# Patient Record
Sex: Male | Born: 1935 | Race: White | Hispanic: No | Smoking: Former smoker
Health system: Southern US, Community
[De-identification: ages and names within clinical notes are randomized; demographics above are authoritative.]

## PROBLEM LIST (undated history)

## (undated) DIAGNOSIS — J189 Pneumonia, unspecified organism: Secondary | ICD-10-CM

## (undated) DIAGNOSIS — N189 Chronic kidney disease, unspecified: Secondary | ICD-10-CM

## (undated) DIAGNOSIS — K219 Gastro-esophageal reflux disease without esophagitis: Secondary | ICD-10-CM

## (undated) DIAGNOSIS — R972 Elevated prostate specific antigen [PSA]: Secondary | ICD-10-CM

## (undated) DIAGNOSIS — Z9621 Cochlear implant status: Secondary | ICD-10-CM

## (undated) DIAGNOSIS — S42309A Unspecified fracture of shaft of humerus, unspecified arm, initial encounter for closed fracture: Secondary | ICD-10-CM

## (undated) DIAGNOSIS — E785 Hyperlipidemia, unspecified: Secondary | ICD-10-CM

## (undated) DIAGNOSIS — D128 Benign neoplasm of rectum: Secondary | ICD-10-CM

## (undated) DIAGNOSIS — K859 Acute pancreatitis without necrosis or infection, unspecified: Secondary | ICD-10-CM

## (undated) DIAGNOSIS — G459 Transient cerebral ischemic attack, unspecified: Secondary | ICD-10-CM

## (undated) DIAGNOSIS — I10 Essential (primary) hypertension: Secondary | ICD-10-CM

## (undated) DIAGNOSIS — K5790 Diverticulosis of intestine, part unspecified, without perforation or abscess without bleeding: Secondary | ICD-10-CM

## (undated) DIAGNOSIS — E119 Type 2 diabetes mellitus without complications: Secondary | ICD-10-CM

## (undated) DIAGNOSIS — R55 Syncope and collapse: Secondary | ICD-10-CM

## (undated) DIAGNOSIS — I2510 Atherosclerotic heart disease of native coronary artery without angina pectoris: Secondary | ICD-10-CM

## (undated) DIAGNOSIS — C801 Malignant (primary) neoplasm, unspecified: Secondary | ICD-10-CM

## (undated) DIAGNOSIS — Q6211 Congenital occlusion of ureteropelvic junction: Secondary | ICD-10-CM

## (undated) HISTORY — PX: POLYPECTOMY: SHX149

## (undated) HISTORY — PX: APPENDECTOMY: SHX54

## (undated) HISTORY — DX: Benign neoplasm of rectum: D12.8

## (undated) HISTORY — PX: CHOLECYSTECTOMY: SHX55

## (undated) HISTORY — DX: Chronic kidney disease, unspecified: N18.9

## (undated) HISTORY — DX: Cochlear implant status: Z96.21

## (undated) HISTORY — DX: Gastro-esophageal reflux disease without esophagitis: K21.9

## (undated) HISTORY — DX: Essential (primary) hypertension: I10

## (undated) HISTORY — DX: Hypercalcemia: E83.52

## (undated) HISTORY — DX: Elevated prostate specific antigen (PSA): R97.20

## (undated) HISTORY — DX: Hyperlipidemia, unspecified: E78.5

## (undated) HISTORY — DX: Diverticulosis of intestine, part unspecified, without perforation or abscess without bleeding: K57.90

## (undated) HISTORY — PX: OTHER SURGICAL HISTORY: SHX169

## (undated) HISTORY — DX: Atherosclerotic heart disease of native coronary artery without angina pectoris: I25.10

## (undated) HISTORY — DX: Acute pancreatitis without necrosis or infection, unspecified: K85.90

## (undated) HISTORY — DX: Transient cerebral ischemic attack, unspecified: G45.9

## (undated) HISTORY — DX: Congenital occlusion of ureteropelvic junction: Q62.11

## (undated) HISTORY — PX: COLONOSCOPY W/ POLYPECTOMY: SHX1380

---
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
## 1976-06-18 HISTORY — PX: PARATHYROIDECTOMY: SHX19

## 1989-02-16 HISTORY — PX: COCHLEAR IMPLANT: SUR684

## 1997-11-10 ENCOUNTER — Other Ambulatory Visit: Admission: RE | Admit: 1997-11-10 | Discharge: 1997-11-10 | Payer: Self-pay | Admitting: Urology

## 1998-06-18 HISTORY — PX: CARDIAC CATHETERIZATION: SHX172

## 1998-06-18 HISTORY — PX: CORONARY ARTERY BYPASS GRAFT: SHX141

## 1998-07-15 ENCOUNTER — Inpatient Hospital Stay (HOSPITAL_COMMUNITY): Admission: EM | Admit: 1998-07-15 | Discharge: 1998-07-20 | Payer: Self-pay | Admitting: Emergency Medicine

## 1998-07-17 ENCOUNTER — Encounter: Payer: Self-pay | Admitting: Gastroenterology

## 1998-07-19 ENCOUNTER — Encounter: Payer: Self-pay | Admitting: Gastroenterology

## 1998-07-19 ENCOUNTER — Encounter: Payer: Self-pay | Admitting: Emergency Medicine

## 1998-08-05 ENCOUNTER — Ambulatory Visit (HOSPITAL_COMMUNITY): Admission: RE | Admit: 1998-08-05 | Discharge: 1998-08-05 | Payer: Self-pay | Admitting: Gastroenterology

## 1998-08-05 ENCOUNTER — Encounter: Payer: Self-pay | Admitting: Gastroenterology

## 1998-08-19 ENCOUNTER — Encounter: Payer: Self-pay | Admitting: Gastroenterology

## 1998-08-19 ENCOUNTER — Ambulatory Visit (HOSPITAL_COMMUNITY): Admission: RE | Admit: 1998-08-19 | Discharge: 1998-08-19 | Payer: Self-pay | Admitting: Gastroenterology

## 1998-08-24 ENCOUNTER — Encounter: Payer: Self-pay | Admitting: Surgery

## 1998-08-26 ENCOUNTER — Encounter: Payer: Self-pay | Admitting: Surgery

## 1998-08-26 ENCOUNTER — Inpatient Hospital Stay (HOSPITAL_COMMUNITY): Admission: RE | Admit: 1998-08-26 | Discharge: 1998-08-30 | Payer: Self-pay | Admitting: Surgery

## 1998-08-27 ENCOUNTER — Encounter: Payer: Self-pay | Admitting: Surgery

## 1998-08-28 ENCOUNTER — Encounter: Payer: Self-pay | Admitting: Surgery

## 1998-10-25 ENCOUNTER — Encounter (HOSPITAL_COMMUNITY): Admission: RE | Admit: 1998-10-25 | Discharge: 1999-01-23 | Payer: Self-pay | Admitting: Cardiovascular Disease

## 2000-02-09 ENCOUNTER — Other Ambulatory Visit: Admission: RE | Admit: 2000-02-09 | Discharge: 2000-02-09 | Payer: Self-pay | Admitting: Urology

## 2000-02-09 ENCOUNTER — Encounter (INDEPENDENT_AMBULATORY_CARE_PROVIDER_SITE_OTHER): Payer: Self-pay | Admitting: Specialist

## 2002-11-06 ENCOUNTER — Encounter: Payer: Self-pay | Admitting: Gastroenterology

## 2003-11-22 ENCOUNTER — Emergency Department (HOSPITAL_COMMUNITY): Admission: EM | Admit: 2003-11-22 | Discharge: 2003-11-22 | Payer: Self-pay | Admitting: Emergency Medicine

## 2004-08-31 ENCOUNTER — Ambulatory Visit: Payer: Self-pay | Admitting: Cardiovascular Disease

## 2004-09-14 ENCOUNTER — Ambulatory Visit: Payer: Self-pay | Admitting: Gastroenterology

## 2004-10-16 ENCOUNTER — Ambulatory Visit: Payer: Self-pay | Admitting: Internal Medicine

## 2004-10-17 ENCOUNTER — Ambulatory Visit: Payer: Self-pay | Admitting: Gastroenterology

## 2005-02-16 ENCOUNTER — Ambulatory Visit: Payer: Self-pay | Admitting: Internal Medicine

## 2005-02-16 ENCOUNTER — Ambulatory Visit: Payer: Self-pay

## 2005-09-12 ENCOUNTER — Ambulatory Visit: Payer: Self-pay | Admitting: Cardiovascular Disease

## 2006-04-19 ENCOUNTER — Ambulatory Visit: Payer: Self-pay | Admitting: Internal Medicine

## 2006-05-16 ENCOUNTER — Ambulatory Visit: Payer: Self-pay | Admitting: Internal Medicine

## 2006-05-16 LAB — CONVERTED CEMR LAB
BUN: 19 mg/dL (ref 6–23)
Basophils Absolute: 0 10*3/uL (ref 0.0–0.1)
Calcium: 9.8 mg/dL (ref 8.4–10.5)
GFR calc non Af Amer: 64 mL/min
Glomerular Filtration Rate, Af Am: 77 mL/min/{1.73_m2}
Glucose, Bld: 127 mg/dL — ABNORMAL HIGH (ref 70–99)
HCT: 43.4 % (ref 39.0–52.0)
HDL: 39.9 mg/dL (ref >39.0)
Hemoglobin: 15 g/dL (ref 13.0–17.0)
Lymphocytes Relative: 30.1 % (ref 12.0–46.0)
MCHC: 34.5 g/dL (ref 30.0–36.0)
Monocytes Relative: 9.9 % (ref 3.0–11.0)
Neutro Abs: 2.6 10*3/uL (ref 1.4–7.7)
Neutrophils Relative %: 56.4 % (ref 43.0–77.0)
Potassium: 3.8 meq/L (ref 3.5–5.1)
TSH: 1.25 microintl units/mL (ref 0.35–5.50)
Total Bilirubin: 1 mg/dL (ref 0.3–1.2)
Uric Acid, Serum: 8.8 mg/dL — ABNORMAL HIGH (ref 2.4–7.0)
VLDL: 55 mg/dL — ABNORMAL HIGH (ref 0–40)

## 2006-05-28 ENCOUNTER — Ambulatory Visit: Payer: Self-pay | Admitting: Internal Medicine

## 2006-09-16 ENCOUNTER — Ambulatory Visit: Payer: Self-pay | Admitting: Cardiovascular Disease

## 2006-10-30 ENCOUNTER — Encounter: Payer: Self-pay | Admitting: Internal Medicine

## 2006-10-30 ENCOUNTER — Ambulatory Visit: Payer: Self-pay | Admitting: Internal Medicine

## 2006-11-05 ENCOUNTER — Ambulatory Visit: Payer: Self-pay | Admitting: Internal Medicine

## 2006-11-05 LAB — CONVERTED CEMR LAB
Cholesterol: 151 mg/dL (ref 0–200)
LDL Cholesterol: 86 mg/dL (ref 0–99)
Triglycerides: 144 mg/dL (ref 0–149)

## 2006-11-08 ENCOUNTER — Ambulatory Visit: Payer: Self-pay | Admitting: Internal Medicine

## 2006-11-29 ENCOUNTER — Telehealth (INDEPENDENT_AMBULATORY_CARE_PROVIDER_SITE_OTHER): Payer: Self-pay | Admitting: *Deleted

## 2007-02-21 ENCOUNTER — Ambulatory Visit: Payer: Self-pay

## 2007-02-21 ENCOUNTER — Ambulatory Visit: Payer: Self-pay | Admitting: Cardiovascular Disease

## 2007-03-18 ENCOUNTER — Encounter: Payer: Self-pay | Admitting: Internal Medicine

## 2007-05-12 ENCOUNTER — Ambulatory Visit: Payer: Self-pay | Admitting: Internal Medicine

## 2007-05-19 ENCOUNTER — Encounter (INDEPENDENT_AMBULATORY_CARE_PROVIDER_SITE_OTHER): Payer: Self-pay | Admitting: *Deleted

## 2007-05-19 LAB — CONVERTED CEMR LAB
ALT: 21 units/L (ref 0–53)
Direct LDL: 94.9 mg/dL
HDL: 35.6 mg/dL — ABNORMAL LOW (ref >39.0)
Hgb A1c MFr Bld: 5.5 % (ref 4.6–6.0)
Total CHOL/HDL Ratio: 4.5
Triglycerides: 205 mg/dL (ref 0–149)
VLDL: 41 mg/dL — ABNORMAL HIGH (ref 0–40)

## 2007-05-22 ENCOUNTER — Ambulatory Visit: Payer: Self-pay | Admitting: Internal Medicine

## 2007-06-04 ENCOUNTER — Ambulatory Visit: Payer: Self-pay | Admitting: Internal Medicine

## 2007-06-04 DIAGNOSIS — E782 Mixed hyperlipidemia: Secondary | ICD-10-CM | POA: Insufficient documentation

## 2007-06-04 LAB — CONVERTED CEMR LAB: HDL goal, serum: 40 mg/dL

## 2007-08-26 ENCOUNTER — Encounter: Payer: Self-pay | Admitting: Internal Medicine

## 2007-10-14 ENCOUNTER — Ambulatory Visit: Payer: Self-pay | Admitting: Gastroenterology

## 2007-10-24 ENCOUNTER — Encounter: Payer: Self-pay | Admitting: Gastroenterology

## 2007-10-24 ENCOUNTER — Encounter: Payer: Self-pay | Admitting: Internal Medicine

## 2007-10-24 ENCOUNTER — Ambulatory Visit: Payer: Self-pay | Admitting: Gastroenterology

## 2007-10-27 ENCOUNTER — Encounter: Payer: Self-pay | Admitting: Gastroenterology

## 2007-11-16 ENCOUNTER — Encounter: Payer: Self-pay | Admitting: Gastroenterology

## 2007-12-02 ENCOUNTER — Ambulatory Visit: Payer: Self-pay | Admitting: Internal Medicine

## 2007-12-02 LAB — CONVERTED CEMR LAB
Cholesterol: 174 mg/dL (ref 0–200)
Triglycerides: 250 mg/dL (ref 0–149)

## 2007-12-09 ENCOUNTER — Ambulatory Visit: Payer: Self-pay | Admitting: Internal Medicine

## 2007-12-09 DIAGNOSIS — Z8739 Personal history of other diseases of the musculoskeletal system and connective tissue: Secondary | ICD-10-CM | POA: Insufficient documentation

## 2007-12-09 DIAGNOSIS — I2510 Atherosclerotic heart disease of native coronary artery without angina pectoris: Secondary | ICD-10-CM | POA: Insufficient documentation

## 2007-12-24 ENCOUNTER — Telehealth (INDEPENDENT_AMBULATORY_CARE_PROVIDER_SITE_OTHER): Payer: Self-pay | Admitting: *Deleted

## 2008-02-19 ENCOUNTER — Inpatient Hospital Stay (HOSPITAL_COMMUNITY): Admission: EM | Admit: 2008-02-19 | Discharge: 2008-02-23 | Payer: Self-pay | Admitting: Internal Medicine

## 2008-02-19 ENCOUNTER — Ambulatory Visit: Payer: Self-pay | Admitting: Cardiovascular Disease

## 2008-02-19 ENCOUNTER — Ambulatory Visit: Payer: Self-pay | Admitting: Internal Medicine

## 2008-02-20 ENCOUNTER — Encounter (INDEPENDENT_AMBULATORY_CARE_PROVIDER_SITE_OTHER): Payer: Self-pay | Admitting: Internal Medicine

## 2008-02-21 ENCOUNTER — Encounter: Payer: Self-pay | Admitting: Internal Medicine

## 2008-02-22 ENCOUNTER — Ambulatory Visit: Payer: Self-pay | Admitting: Internal Medicine

## 2008-02-22 ENCOUNTER — Encounter: Payer: Self-pay | Admitting: Internal Medicine

## 2008-02-23 ENCOUNTER — Encounter: Payer: Self-pay | Admitting: Internal Medicine

## 2008-02-24 ENCOUNTER — Telehealth: Payer: Self-pay | Admitting: Gastroenterology

## 2008-02-24 ENCOUNTER — Telehealth (INDEPENDENT_AMBULATORY_CARE_PROVIDER_SITE_OTHER): Payer: Self-pay | Admitting: *Deleted

## 2008-02-26 ENCOUNTER — Ambulatory Visit: Payer: Self-pay | Admitting: Internal Medicine

## 2008-02-26 DIAGNOSIS — R9389 Abnormal findings on diagnostic imaging of other specified body structures: Secondary | ICD-10-CM | POA: Insufficient documentation

## 2008-03-04 ENCOUNTER — Encounter: Payer: Self-pay | Admitting: Internal Medicine

## 2008-03-19 ENCOUNTER — Ambulatory Visit: Payer: Self-pay | Admitting: Gastroenterology

## 2008-03-19 DIAGNOSIS — Z860100 Personal history of colon polyps, unspecified: Secondary | ICD-10-CM | POA: Insufficient documentation

## 2008-03-19 DIAGNOSIS — R933 Abnormal findings on diagnostic imaging of other parts of digestive tract: Secondary | ICD-10-CM | POA: Insufficient documentation

## 2008-04-06 ENCOUNTER — Encounter: Payer: Self-pay | Admitting: Internal Medicine

## 2008-04-12 ENCOUNTER — Ambulatory Visit: Payer: Self-pay | Admitting: Internal Medicine

## 2008-04-20 LAB — CONVERTED CEMR LAB
ALT: 14 units/L (ref 0–53)
AST: 16 units/L (ref 0–37)
BUN: 22 mg/dL (ref 6–23)
Bilirubin, Direct: 0.1 mg/dL (ref 0.0–0.3)
Creatinine,U: 118.5 mg/dL
Direct LDL: 127.4 mg/dL
HDL: 34.4 mg/dL — ABNORMAL LOW (ref >39.0)
Microalb Creat Ratio: 5.1 mg/g (ref 0.0–30.0)
Potassium: 4.5 meq/L (ref 3.5–5.1)
Total Bilirubin: 1 mg/dL (ref 0.3–1.2)
Total Protein: 7.2 g/dL (ref 6.0–8.3)
VLDL: 30 mg/dL (ref 0–40)

## 2008-05-03 ENCOUNTER — Ambulatory Visit: Payer: Self-pay | Admitting: Cardiology

## 2008-05-04 ENCOUNTER — Ambulatory Visit: Payer: Self-pay | Admitting: Internal Medicine

## 2008-05-04 DIAGNOSIS — E1129 Type 2 diabetes mellitus with other diabetic kidney complication: Secondary | ICD-10-CM | POA: Insufficient documentation

## 2008-05-04 DIAGNOSIS — E1165 Type 2 diabetes mellitus with hyperglycemia: Secondary | ICD-10-CM

## 2008-05-18 ENCOUNTER — Encounter: Payer: Self-pay | Admitting: Internal Medicine

## 2008-07-30 ENCOUNTER — Encounter: Payer: Self-pay | Admitting: Internal Medicine

## 2008-08-04 ENCOUNTER — Ambulatory Visit: Payer: Self-pay | Admitting: Internal Medicine

## 2008-08-16 ENCOUNTER — Encounter (INDEPENDENT_AMBULATORY_CARE_PROVIDER_SITE_OTHER): Payer: Self-pay | Admitting: *Deleted

## 2008-08-16 ENCOUNTER — Encounter: Payer: Self-pay | Admitting: Internal Medicine

## 2008-08-24 ENCOUNTER — Ambulatory Visit: Payer: Self-pay | Admitting: Internal Medicine

## 2008-11-23 ENCOUNTER — Encounter: Payer: Self-pay | Admitting: Gastroenterology

## 2008-11-25 ENCOUNTER — Encounter: Payer: Self-pay | Admitting: Internal Medicine

## 2008-12-01 ENCOUNTER — Telehealth (INDEPENDENT_AMBULATORY_CARE_PROVIDER_SITE_OTHER): Payer: Self-pay

## 2008-12-13 ENCOUNTER — Ambulatory Visit: Payer: Self-pay | Admitting: Internal Medicine

## 2008-12-21 LAB — CONVERTED CEMR LAB
Direct LDL: 127.9 mg/dL
Total CHOL/HDL Ratio: 6
Triglycerides: 208 mg/dL — ABNORMAL HIGH (ref 0.0–149.0)
Uric Acid, Serum: 8.9 mg/dL — ABNORMAL HIGH (ref 4.0–7.8)
VLDL: 41.6 mg/dL — ABNORMAL HIGH (ref 0.0–40.0)

## 2008-12-23 ENCOUNTER — Ambulatory Visit: Payer: Self-pay | Admitting: Internal Medicine

## 2008-12-24 ENCOUNTER — Encounter (INDEPENDENT_AMBULATORY_CARE_PROVIDER_SITE_OTHER): Payer: Self-pay | Admitting: *Deleted

## 2008-12-24 LAB — CONVERTED CEMR LAB: Hgb A1c MFr Bld: 5.8 % (ref 4.6–6.5)

## 2009-01-03 ENCOUNTER — Ambulatory Visit: Payer: Self-pay | Admitting: Gastroenterology

## 2009-01-07 ENCOUNTER — Ambulatory Visit: Payer: Self-pay | Admitting: Gastroenterology

## 2009-01-07 ENCOUNTER — Encounter: Payer: Self-pay | Admitting: Gastroenterology

## 2009-01-07 ENCOUNTER — Encounter: Payer: Self-pay | Admitting: Internal Medicine

## 2009-01-11 ENCOUNTER — Ambulatory Visit: Payer: Self-pay | Admitting: Internal Medicine

## 2009-01-12 ENCOUNTER — Encounter: Payer: Self-pay | Admitting: Internal Medicine

## 2009-01-12 ENCOUNTER — Encounter: Payer: Self-pay | Admitting: Gastroenterology

## 2009-01-26 ENCOUNTER — Encounter: Payer: Self-pay | Admitting: Internal Medicine

## 2009-02-10 ENCOUNTER — Encounter: Payer: Self-pay | Admitting: Internal Medicine

## 2009-03-01 DIAGNOSIS — K2980 Duodenitis without bleeding: Secondary | ICD-10-CM | POA: Insufficient documentation

## 2009-03-01 DIAGNOSIS — K5730 Diverticulosis of large intestine without perforation or abscess without bleeding: Secondary | ICD-10-CM | POA: Insufficient documentation

## 2009-03-02 ENCOUNTER — Ambulatory Visit: Payer: Self-pay | Admitting: Cardiovascular Disease

## 2009-03-02 DIAGNOSIS — I10 Essential (primary) hypertension: Secondary | ICD-10-CM | POA: Insufficient documentation

## 2009-04-13 ENCOUNTER — Encounter: Payer: Self-pay | Admitting: Internal Medicine

## 2009-04-25 ENCOUNTER — Ambulatory Visit: Payer: Self-pay | Admitting: Internal Medicine

## 2009-04-26 LAB — CONVERTED CEMR LAB
Cholesterol: 218 mg/dL — ABNORMAL HIGH (ref 0–200)
Total CHOL/HDL Ratio: 7
Triglycerides: 220 mg/dL — ABNORMAL HIGH (ref 0.0–149.0)

## 2009-04-28 ENCOUNTER — Encounter: Payer: Self-pay | Admitting: Internal Medicine

## 2009-05-02 ENCOUNTER — Ambulatory Visit: Payer: Self-pay | Admitting: Internal Medicine

## 2009-05-24 ENCOUNTER — Encounter: Payer: Self-pay | Admitting: Internal Medicine

## 2009-07-25 ENCOUNTER — Ambulatory Visit: Payer: Self-pay | Admitting: Internal Medicine

## 2009-08-01 LAB — CONVERTED CEMR LAB
ALT: 24 units/L (ref 0–53)
Albumin: 4 g/dL (ref 3.5–5.2)
Bilirubin, Direct: 0 mg/dL (ref 0.0–0.3)
Cholesterol: 189 mg/dL (ref 0–200)
LDL Cholesterol: 111 mg/dL — ABNORMAL HIGH (ref 0–99)
Total CHOL/HDL Ratio: 5
Triglycerides: 186 mg/dL — ABNORMAL HIGH (ref 0.0–149.0)
VLDL: 37.2 mg/dL (ref 0.0–40.0)

## 2009-08-02 ENCOUNTER — Ambulatory Visit: Payer: Self-pay | Admitting: Internal Medicine

## 2009-08-22 ENCOUNTER — Ambulatory Visit: Payer: Self-pay | Admitting: Cardiovascular Disease

## 2009-10-03 ENCOUNTER — Encounter: Payer: Self-pay | Admitting: Internal Medicine

## 2009-10-12 ENCOUNTER — Encounter: Payer: Self-pay | Admitting: Internal Medicine

## 2010-01-18 ENCOUNTER — Ambulatory Visit: Payer: Self-pay | Admitting: Internal Medicine

## 2010-01-26 LAB — CONVERTED CEMR LAB
Albumin: 4.3 g/dL (ref 3.5–5.2)
BUN: 35 mg/dL — ABNORMAL HIGH (ref 6–23)
Cholesterol: 200 mg/dL (ref 0–200)
Creatinine,U: 131.7 mg/dL
HDL: 33.3 mg/dL — ABNORMAL LOW (ref >39.00)
Microalb Creat Ratio: 1.7 mg/g (ref 0.0–30.0)
Potassium: 4.5 meq/L (ref 3.5–5.1)
Total Bilirubin: 0.9 mg/dL (ref 0.3–1.2)
Total Protein: 6.9 g/dL (ref 6.0–8.3)
Triglycerides: 200 mg/dL — ABNORMAL HIGH (ref 0.0–149.0)
VLDL: 40 mg/dL (ref 0.0–40.0)

## 2010-01-30 ENCOUNTER — Ambulatory Visit: Payer: Self-pay | Admitting: Internal Medicine

## 2010-02-01 ENCOUNTER — Telehealth: Payer: Self-pay | Admitting: Gastroenterology

## 2010-02-28 ENCOUNTER — Ambulatory Visit: Payer: Self-pay | Admitting: Cardiovascular Disease

## 2010-04-03 ENCOUNTER — Ambulatory Visit: Payer: Self-pay | Admitting: Gastroenterology

## 2010-04-03 DIAGNOSIS — K219 Gastro-esophageal reflux disease without esophagitis: Secondary | ICD-10-CM

## 2010-04-03 DIAGNOSIS — R141 Gas pain: Secondary | ICD-10-CM

## 2010-04-03 DIAGNOSIS — R143 Flatulence: Secondary | ICD-10-CM

## 2010-04-03 DIAGNOSIS — R142 Eructation: Secondary | ICD-10-CM

## 2010-04-13 ENCOUNTER — Encounter: Payer: Self-pay | Admitting: Internal Medicine

## 2010-04-14 ENCOUNTER — Ambulatory Visit: Payer: Self-pay | Admitting: Internal Medicine

## 2010-06-01 ENCOUNTER — Ambulatory Visit: Payer: Self-pay | Admitting: Internal Medicine

## 2010-06-01 LAB — CONVERTED CEMR LAB
BUN: 33 mg/dL — ABNORMAL HIGH (ref 6–23)
Cholesterol: 200 mg/dL (ref 0–200)
LDL Cholesterol: 130 mg/dL — ABNORMAL HIGH (ref 0–99)
Potassium: 4.7 meq/L (ref 3.5–5.1)
Total CHOL/HDL Ratio: 6
Uric Acid, Serum: 7.1 mg/dL (ref 4.0–7.8)

## 2010-06-08 ENCOUNTER — Ambulatory Visit: Payer: Self-pay | Admitting: Internal Medicine

## 2010-06-08 DIAGNOSIS — S4980XA Other specified injuries of shoulder and upper arm, unspecified arm, initial encounter: Secondary | ICD-10-CM | POA: Insufficient documentation

## 2010-07-07 ENCOUNTER — Encounter: Payer: Self-pay | Admitting: Internal Medicine

## 2010-07-13 ENCOUNTER — Encounter
Admission: RE | Admit: 2010-07-13 | Discharge: 2010-07-18 | Payer: Self-pay | Source: Home / Self Care | Attending: Sports Medicine | Admitting: Sports Medicine

## 2010-07-18 NOTE — Progress Notes (Signed)
Summary: speak to nurse   Phone Note Call from Patient Call back at Home Phone [phone]   Caller: spouse Lila Call For: Dr Russella Dar Reason for Call: Refill Medication, Talk to Nurse Summary of Call: Patient needs rx refill for his Prilosec Initial call taken by: Tawni Levy,  February 01, 2010 2:37 PM  Follow-up for Phone Call        Pt needs office visit to get any more refills on his omeprazole. Told pt's wife and she scheduled a office visit. Rx was sent to pts pharmacy.  Follow-up by: Christie Nottingham CMA Duncan Dull),  February 01, 2010 2:52 PM    Prescriptions: OMEPRAZOLE 20 MG  CPDR (OMEPRAZOLE) 1 each day 30 minutes before meal  #30 x 1   Entered by:   Christie Nottingham CMA (AAMA)   Authorized by:   Meryl Dare MD [facility]   Signed by:   Christie Nottingham CMA (AAMA) on 02/01/2010   Method used:   Electronically to        [id number] (retail)       [address]       [place], [place]  [address]       Ph: [phone] or [phone]       Fax: [phone]   RxID:   [id number]

## 2010-07-18 NOTE — Assessment & Plan Note (Signed)
Summary: 3 mth fu/ns/kdc   Vital Signs:  Patient profile:   75 year old [name] Weight:      196.2 pounds Pulse rate:   64 / minute Resp:     15 per minute BP sitting:   124 / 70  (left arm) Cuff size:   large  Vitals Entered By: Shonna Chock (August 02, 2009 8:15 AM) CC: 3 Month follow-up Comments REVIEWED MED LIST, PATIENT AGREED DOSE AND INSTRUCTION CORRECT    Primary Care Provider:  Marga Melnick, MD  CC:  3 Month follow-up.  History of Present Illness: Labs reviewed  & risks discussed. Significant improvement in lipids but A1c up from 5.7% to 6.1%. He is on Sugar Busters low carb; CVE as aerobics 3X/ week for 45 min w/o symptoms. Weight stable.  Allergies: 1)  ! Simvastatin (Simvastatin)  Review of Systems Eyes:  Last exam < 12 mos; no retinopathy. CV:  Denies chest pain or discomfort, leg cramps with exertion, palpitations, and shortness of breath with exertion. Derm:  Denies poor wound healing. Neuro:  Denies numbness and tingling. Endo:  Complains of polyuria; denies excessive hunger, excessive thirst, and excessive urination; Avodart started by Dr Aldean Ast.  Physical Exam  General:  Thin,well-nourished; alert,appropriate and cooperative throughout examination Lungs:  Normal respiratory effort, chest expands symmetrically. Lungs are clear to auscultation, no crackles or wheezes. Heart:  Normal rate and regular rhythm. S1 and S2 normal without gallop, murmur, click, rub or other extra sounds. Pulses:  R and L carotid,radial,dorsalis pedis and posterior tibial pulses are full and equal bilaterally Extremities:  No clubbing, cyanosis, edema. Fungal nail changes Neurologic:  alert & oriented X3 and sensation intact to light touch over feet.     Impression & Recommendations:  Problem # 1:  FASTING HYPERGLYCEMIA (ICD-790.29) A1c just @ Diabetes cut off, up from 5.7%  Problem # 2:  HYPERLIPIDEMIA (ICD-272.2) LDL improved; LDL goal = < 100 His updated medication  list for this problem includes:    Antara 130 Mg Caps (Fenofibrate micronized) .Marland Kitchen... 1 once daily  Problem # 3:  ESSENTIAL HYPERTENSION, BENIGN (ICD-401.1) Controlled His updated medication list for this problem includes:    Metoprolol Tartrate 50 Mg Tabs (Metoprolol tartrate) .Marland Kitchen... Take one tablet twice daily  Problem # 4:  GOUT (ICD-274.9) Uric acid @ goal His updated medication list for this problem includes:    Allopurinol 100 Mg Tabs (Allopurinol) .Marland Kitchen... 1 tab by mouth  once daily  Complete Medication List: 1)  Metoprolol Tartrate 50 Mg Tabs (Metoprolol tartrate) .... Take one tablet twice daily 2)  Aspirin Adult Low Strength 81 Mg Tbec (Aspirin) .Marland Kitchen.. 1 by mouth once daily 3)  Freestyle Lancets Misc (Lancets) .... Check bs 4 x weekly 4)  Omeprazole 20 Mg Cpdr (Omeprazole) .Marland Kitchen.. 1 each day 30 minutes before meal 5)  Allopurinol 100 Mg Tabs (Allopurinol) .Marland Kitchen.. 1 tab by mouth  once daily 6)  Avodart 0.5 Mg Caps (Dutasteride) .Marland Kitchen.. 1 by mouth once daily 7)  Antara 130 Mg Caps (Fenofibrate micronized) .Marland Kitchen.. 1 once daily  Patient Instructions: 1)  Consume LESS THAN 40 grams of sugar / day from LABELED foods & drinks with High Fructose Corn Syrup as #1,2 or #3 on label. 2)  Please schedule a follow-up appointment in 6 months. 3)  BUN,creat, K+ prior to visit, ICD-9: 4)  Hepatic Panel prior to visit, ICD-9: 5)  Lipid Panel prior to visit, ICD-9: 6)  HbgA1C prior to visit, ICD-9: 7)  Urine Microalbumin prior  to visit, ICD-9:  Prevention & Chronic Care Immunizations   Influenza vaccine: Fluvax 3+  (05/02/2009)    Tetanus booster: Not documented    Pneumococcal vaccine: Pneumovax  (04/19/2006)    H. zoster vaccine: Not documented  Colorectal Screening   Hemoccult: Not documented    Colonoscopy: Location:  [facility].    (10/24/2007)   Colonoscopy due: 10/2010  Other Screening   PSA: Not documented   Smoking status: quit  (03/19/2008)  Lipids   Total  Cholesterol: 189  (07/25/2009)   LDL: 111  (07/25/2009)   LDL Direct: 147.8  (04/25/2009)   HDL: 40.90  (07/25/2009)   Triglycerides: 186.0  (07/25/2009)    SGOT (AST): 28  (07/25/2009)   SGPT (ALT): 24  (07/25/2009)   Alkaline phosphatase: 42  (07/25/2009)   Total bilirubin: 0.8  (07/25/2009)  Hypertension   Last Blood Pressure: 124 / 70  (08/02/2009)   Serum creatinine: 1.3  (07/25/2009)   Serum potassium 4.0  (07/25/2009)  Self-Management Support :    Hypertension self-management support: Not documented    Lipid self-management support: Not documented

## 2010-07-18 NOTE — Assessment & Plan Note (Signed)
Summary: F8M/DM      Allergies Added:   Visit Type:  Follow-up Primary Provider:  Marga Melnick, MD  CC:  NO COMPLAINTS.  History of Present Illness: Juan [name] is seen today for F/U of CAD with CABG in 2000, and CRF including HTN and elevated cholesterol.  He has been doing well with no SSCP, palpitations, SOB, PND, orthopnea, edema or syncope.  He has been active with his 4 grandchildren and traveling to [place].  He has been compliant with his meds.  His cholesterol has been under good control and followed by Dr. Alwyn Ren.  His last myovue in 02/2007 was low risk with a small inferoseptal infarct and mild periinarct ischemia.  He has a new hearing aid that seems to be working well   Current Problems (verified): 1)  Essential Hypertension, Benign  (ICD-401.1) 2)  Duodenitis  (ICD-535.60) 3)  Hyperlipidemia  (ICD-272.2) 4)  Coronary Artery Disease  (ICD-414.00) 5)  Fasting Hyperglycemia  (ICD-790.29) 6)  Abnormal Findings Gi Tract  (ICD-793.4) 7)  Personal Hx Colonic Polyps  (ICD-V12.72) 8)  Nonspecific Abnormal Find Rad&oth Exam Gu Orgn  (ICD-793.5) 9)  Gout  (ICD-274.9) 10)  Family History Diabetes 1st Degree Relative  (ICD-V18.0) 11)  Diverticular Disease  (ICD-562.10)  Current Medications (verified): 1)  Metoprolol Tartrate 50 Mg Tabs (Metoprolol Tartrate) .... Take One Tablet Twice Daily 2)  Aspirin Adult Low Strength 81 Mg  Tbec (Aspirin) .Marland Kitchen.. 1 By Mouth Once Daily 3)  Freestyle Lancets  Misc (Lancets) .... Check Bs 4 X Weekly 4)  Omeprazole 20 Mg  Cpdr (Omeprazole) .Marland Kitchen.. 1 Each Day 30 Minutes Before Meal 5)  Allopurinol 100 Mg Tabs (Allopurinol) .Marland Kitchen.. 1 Tab By Mouth  Once Daily 6)  Avodart 0.5 Mg Caps (Dutasteride) .Marland Kitchen.. 1 By Mouth Once Daily 7)  Antara 130 Mg Caps (Fenofibrate Micronized) .Marland Kitchen.. 1 Once Daily  Allergies (verified): 1)  ! 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  Alcohol Use - no Illicit Drug Use - no Patient gets regular exercise.  Review of Systems       Denies fever, malais, weight loss, blurry vision, decreased visual acuity, cough, sputum, SOB, hemoptysis, pleuritic pain, palpitaitons, heartburn, abdominal pain, melena, lower extremity edema, claudication, or rash.   Vital Signs:  Patient profile:   75 year old male Height:      71 inches Weight:      192 pounds Pulse rate:   60 / minute BP sitting:   140 / 79  (left arm) Cuff size:   large  Vitals Entered By: Burnett Kanaris, CNA (February 28, 2010 3:57 PM)  Physical Exam  General:  Affect appropriate Healthy:  appears stated age HEENT: normal Neck supple with no adenopathy JVP normal no bruits no thyromegaly Lungs clear with no wheezing and good diaphragmatic motion Heart:  S1/S2 no murmur,rub,  gallop or click PMI normal Abdomen: benighn, BS positve, no tenderness, no AAA no bruit.  No HSM or HJR Distal pulses intact with no bruits No edema Neuro non-focal Skin warm and dry Occipital ear piece on right   Impression &  Recommendations:  Problem # 1:  ESSENTIAL HYPERTENSION, BENIGN (ICD-401.1) Well controlled contnue current meds His updated medication list for this problem includes:    Metoprolol Tartrate 50 Mg Tabs (Metoprolol tartrate) .Marland Kitchen... Take one tablet twice daily    Aspirin Adult Low Strength 81 Mg Tbec (Aspirin) .Marland Kitchen... 1 by mouth once daily  Problem # 2:  HYPERLIPIDEMIA (ICD-272.2) Continue fibrate.  F/U diet and labs per Hopper His updated medication list for this problem includes:    Antara 130 Mg Caps (Fenofibrate micronized) .Marland Kitchen... 1 once daily  CHOL: 200 (01/18/2010)   LDL: 127 (01/18/2010)   HDL: 33.30 (01/18/2010)   TG: 200.0 (01/18/2010) CHOL (goal): 200 (06/04/2007)   LDL (goal): 130 (06/04/2007)   HDL (goal): 40 (06/04/2007)   TG (goal): 150 (06/04/2007)  Problem # 3:  CORONARY ARTERY DISEASE (ICD-414.00) Stabel no angina continue ASA and BB His updated medication list for this problem includes:    Metoprolol Tartrate 50 Mg Tabs (Metoprolol tartrate) .Marland Kitchen... Take one tablet twice daily    Aspirin Adult Low Strength 81 Mg Tbec (Aspirin) .Marland Kitchen... 1 by mouth once daily  Orders: EKG w/ Interpretation (93000)  Patient Instructions: 1)  Your physician recommends that you schedule a follow-up appointment in: 6 MONTHS WITH DR Eden Emms 2)  Your physician recommends that you continue on your current medications as directed. Please refer to the Current Medication list given to you today.   EKG Report  Procedure date:  02/28/2010  Findings:      NSR 60 Low voltage Lateral T wave inversion unchaged from prev.

## 2010-07-18 NOTE — Assessment & Plan Note (Signed)
Summary: 6 month rov/sl      Allergies Added:   Primary Provider:  Marga Melnick, MD   History of Present Illness: Juan [name] is seen today for F/U of CAD with CABG in 2000, and CRF including HTN and elevated cholesterol.  He has been doing well with no SSCP, palpitations, SOB, PND, orthopnea, edema or syncope.  He has been active with his 4 grandchildren and traveling to [place].  He has been compliant with his meds.  His cholesterol has been under good control and followed by Dr. Alwyn Ren.  His last myovue in 02/2007 was low risk with a small inferoseptal infarct and mild periinarct ischemia.    Current Problems (verified): 1)  Essential Hypertension, Benign  (ICD-401.1) 2)  Duodenitis  (ICD-535.60) 3)  Hyperlipidemia  (ICD-272.2) 4)  Coronary Artery Disease  (ICD-414.00) 5)  Fasting Hyperglycemia  (ICD-790.29) 6)  Abnormal Findings Gi Tract  (ICD-793.4) 7)  Personal Hx Colonic Polyps  (ICD-V12.72) 8)  Nonspecific Abnormal Find Rad&oth Exam Gu Orgn  (ICD-793.5) 9)  Gout  (ICD-274.9) 10)  Family History Diabetes 1st Degree Relative  (ICD-V18.0) 11)  Diverticular Disease  (ICD-562.10)  Current Medications (verified): 1)  Metoprolol Tartrate 50 Mg Tabs (Metoprolol Tartrate) .... Take One Tablet Twice Daily 2)  Aspirin Adult Low Strength 81 Mg  Tbec (Aspirin) .Marland Kitchen.. 1 By Mouth Once Daily 3)  Freestyle Lancets  Misc (Lancets) .... Check Bs 4 X Weekly 4)  Omeprazole 20 Mg  Cpdr (Omeprazole) .Marland Kitchen.. 1 Each Day 30 Minutes Before Meal 5)  Allopurinol 100 Mg Tabs (Allopurinol) .Marland Kitchen.. 1 Tab By Mouth  Once Daily 6)  Avodart 0.5 Mg Caps (Dutasteride) .Marland Kitchen.. 1 By Mouth Once Daily 7)  Antara 130 Mg Caps (Fenofibrate Micronized) .Marland Kitchen.. 1 Once Daily  Allergies (verified): 1)  ! 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  Alcohol Use - no Illicit Drug Use - no Patient gets regular exercise.  Review of Systems       Denies fever, malais, weight loss, blurry vision, decreased visual acuity, cough, sputum, SOB, hemoptysis, pleuritic pain, palpitaitons, heartburn, abdominal pain, melena, lower extremity edema, claudication, or rash.   Vital Signs:  Patient profile:   75 year old male Height:      71 inches Weight:      198 pounds BMI:     27.72 Pulse rate:   61 / minute Resp:     14 per minute BP sitting:   132 / 77  (left arm)  Vitals Entered By: Kem Parkinson (August 22, 2009 11:29 AM)  Physical Exam  General:  Affect appropriate Healthy:  appears stated age HEENT: normal Neck supple with no adenopathy JVP normal no bruits no thyromegaly Lungs clear with no wheezing and good diaphragmatic motion Heart:  S1/S2 no murmur,rub, gallop or click PMI normal Abdomen: benighn, BS positve,  no tenderness, no AAA no bruit.  No HSM or HJR Distal pulses intact with no bruits No edema Neuro non-focal Skin warm and dry    Impression & Recommendations:  Problem # 1:  ESSENTIAL HYPERTENSION, BENIGN (ICD-401.1) Well controlled His updated medication list for this  problem includes:    Metoprolol Tartrate 50 Mg Tabs (Metoprolol tartrate) .Marland Kitchen... Take one tablet twice daily    Aspirin Adult Low Strength 81 Mg Tbec (Aspirin) .Marland Kitchen... 1 by mouth once daily  Problem # 2:  CORONARY ARTERY DISEASE (ICD-414.00) S/P CABG in 2000  No angina.  Continue medical Rx His updated medication list for this problem includes:    Metoprolol Tartrate 50 Mg Tabs (Metoprolol tartrate) .Marland Kitchen... Take one tablet twice daily    Aspirin Adult Low Strength 81 Mg Tbec (Aspirin) .Marland Kitchen... 1 by mouth once daily  Problem # 3:  HYPERLIPIDEMIA (ICD-272.2) Intolerant to statins.  Continue fibrates.  F.U Hopper His updated medication list for this problem includes:    Antara 130 Mg Caps (Fenofibrate micronized) .Marland Kitchen... 1 once daily  CHOL: 189 (07/25/2009)   LDL: 111 (07/25/2009)   HDL: 40.90 (07/25/2009)   TG: 186.0 (07/25/2009) CHOL (goal): 200 (06/04/2007)   LDL (goal): 130 (06/04/2007)   HDL (goal): 40 (06/04/2007)   TG (goal): 150 (06/04/2007)  Patient Instructions: 1)  Your physician recommends that you schedule a follow-up appointment in: 6 MONTHS

## 2010-07-18 NOTE — Assessment & Plan Note (Signed)
Summary: 6 month roa//lch   Vital Signs:  Patient profile:   75 year old male Weight:      195.2 pounds Pulse rate:   56 / minute Resp:     14 per minute BP sitting:   122 / 74  (left arm) Cuff size:   large  Vitals Entered By: Shonna Chock CMA (January 30, 2010 8:58 AM) CC: 6 month follow-up, copy of labs given   Primary Care Provider:  Marga Melnick, MD  CC:  6 month follow-up and copy of labs given.  History of Present Illness: Hyperlipidemia Follow-Up      This is a 75 year old man who presents for Hyperlipidemia follow-up.  The patient denies muscle aches, GI upset, abdominal pain, flushing, itching, constipation, diarrhea, and fatigue.  The patient denies the following symptoms: chest pain/pressure, exercise intolerance, dypsnea, palpitations, syncope, and pedal edema.  Compliance with medications (by patient report) has been near 100%.  Dietary compliance has been good.  The patient reports exercising occasionally.  Adjunctive measures currently used by the patient include ASA.  Labs reviewed : A1c down from 6.1 % to 6 %; Triglycerides(TG)  200 on Antara 130 mg, up from 189 in 02/11. Role of High Fructose Corn Syrup in raising TG  & uric acid discussed.   Current Medications (verified): 1)  Metoprolol Tartrate 50 Mg Tabs (Metoprolol Tartrate) .... Take One Tablet Twice Daily 2)  Aspirin Adult Low Strength 81 Mg  Tbec (Aspirin) .Marland Kitchen.. 1 By Mouth Once Daily 3)  Freestyle Lancets  Misc (Lancets) .... Check Bs 4 X Weekly 4)  Omeprazole 20 Mg  Cpdr (Omeprazole) .Marland Kitchen.. 1 Each Day 30 Minutes Before Meal 5)  Allopurinol 100 Mg Tabs (Allopurinol) .Marland Kitchen.. 1 Tab By Mouth  Once Daily 6)  Avodart 0.5 Mg Caps (Dutasteride) .Marland Kitchen.. 1 By Mouth Once Daily 7)  Antara 130 Mg Caps (Fenofibrate Micronized) .Marland Kitchen.. 1 Once Daily  Allergies: 1)  ! Simvastatin (Simvastatin)  Review of Systems GU:  Denies discharge, dysuria, hematuria, and incontinence; Nocturia X 1; creatinine up from 1.3 in 02/11 to 1.5; BUN  35.Marland Kitchen  Physical Exam  General:  well-nourished; alert,appropriate and cooperative throughout examination Lungs:  Normal respiratory effort, chest expands symmetrically. Lungs are clear to auscultation, no crackles or wheezes. Heart:  Normal rate and regular rhythm. S1 and S2 normal without gallop, murmur, click, rub or other extra sounds. Pulses:  R and L carotid,radial,femoral,dorsalis pedis and posterior tibial pulses are full and equal bilaterally Extremities:  No clubbing, cyanosis, edema. Neurologic:  alert & oriented X3.     Impression & Recommendations:  Problem # 1:  ESSENTIAL HYPERTENSION, BENIGN (ICD-401.1) controlled His updated medication list for this problem includes:    Metoprolol Tartrate 50 Mg Tabs (Metoprolol tartrate) .Marland Kitchen... Take one tablet twice daily  Problem # 2:  HYPERLIPIDEMIA (ICD-272.2) TG rising; pre Diabetes pattern present His updated medication list for this problem includes:    Antara 130 Mg Caps (Fenofibrate micronized) .Marland Kitchen... 1 once daily  Complete Medication List: 1)  Metoprolol Tartrate 50 Mg Tabs (Metoprolol tartrate) .... Take one tablet twice daily 2)  Aspirin Adult Low Strength 81 Mg Tbec (Aspirin) .Marland Kitchen.. 1 by mouth once daily 3)  Freestyle Lancets Misc (Lancets) .... Check bs 4 x weekly 4)  Omeprazole 20 Mg Cpdr (Omeprazole) .Marland Kitchen.. 1 each day 30 minutes before meal 5)  Allopurinol 100 Mg Tabs (Allopurinol) .Marland Kitchen.. 1 tab by mouth  once daily 6)  Avodart 0.5 Mg Caps (Dutasteride) .Marland KitchenMarland KitchenMarland Kitchen 1  by mouth once daily 7)  Antara 130 Mg Caps (Fenofibrate micronized) .Marland Kitchen.. 1 once daily  Patient Instructions: 1)  Consume LESS THAN 40 grams of High Fructose Corn Syrup / day as discussed. Drink to thirst , up to 40 oz of water / day. 2)  Please schedule a follow-up appointment in 4 months. 3)  BUN, creat, K+, uric acid  prior to visit, ICD-9:401.9; 274.9 4)  Lipid Panel , A1cprior to visit, ICD-9:277.7

## 2010-07-18 NOTE — Assessment & Plan Note (Signed)
Summary: FLU SHOT//PH   Nurse Visit  CC: Flu shot./kb   Allergies: 1)  ! Simvastatin (Simvastatin)  Orders Added: 1)  Admin 1st Vaccine [90471] 2)  Flu Vaccine 33yrs + [81191]           Flu Vaccine Consent Questions     Do you have a history of severe allergic reactions to this vaccine? no    Any prior history of allergic reactions to egg and/or gelatin? no    Do you have a sensitivity to the preservative Thimersol? no    Do you have a past history of Guillan-Barre Syndrome? no    Do you currently have an acute febrile illness? no    Have you ever had a severe reaction to latex? no    Vaccine information given and explained to patient? yes    Are you currently pregnant? no    Lot Number:[id number]   Exp Date:12/16/2010   Site Given  Left Deltoid IMu

## 2010-07-18 NOTE — Letter (Signed)
Summary: [facility]  [facility]   Imported By: Lanelle Bal 10/10/2009 11:40:09  _____________________________________________________________________  External Attachment:    Type:   Image     Comment:   External Document

## 2010-07-18 NOTE — Letter (Signed)
Summary: [facility]  [facility]   Imported By: Lanelle Bal 10/18/2009 11:53:32  _____________________________________________________________________  External Attachment:    Type:   Image     Comment:   External Document

## 2010-07-18 NOTE — Assessment & Plan Note (Signed)
Summary: GERD yearly f/u, med refills   History of Present Illness Visit Type: Follow-up Visit Primary GI MD: Elie Goody MD [facility] Primary Provider: Marga Melnick, MD Requesting Provider: na Chief Complaint: Yearly f/u for GERD. Pt c/o frequent BMs but denies any other GI complaints  History of Present Illness:   This is a return office visit for GERD and duodenitis. He relates increased gas and flatus but has no other gastrointestinal complaints. His last colonoscopy was in May 2009.   GI Review of Systems      Denies abdominal pain, acid reflux, belching, bloating, chest pain, dysphagia with liquids, dysphagia with solids, heartburn, loss of appetite, nausea, vomiting, vomiting blood, weight loss, and  weight gain.        Denies anal fissure, black tarry stools, change in bowel habit, constipation, diarrhea, diverticulosis, fecal incontinence, heme positive stool, hemorrhoids, irritable bowel syndrome, jaundice, light color stool, liver problems, rectal bleeding, and  rectal pain.   Current Medications (verified): 1)  Metoprolol Tartrate 50 Mg Tabs (Metoprolol Tartrate) .... Take One Tablet Twice Daily 2)  Aspirin Adult Low Strength 81 Mg  Tbec (Aspirin) .Marland Kitchen.. 1 By Mouth Once Daily 3)  Freestyle Lancets  Misc (Lancets) .... Check Bs 4 X Weekly 4)  Omeprazole 20 Mg  Cpdr (Omeprazole) .Marland Kitchen.. 1 Each Day 30 Minutes Before Meal 5)  Allopurinol 100 Mg Tabs (Allopurinol) .Marland Kitchen.. 1 Tab By Mouth  Once Daily 6)  Avodart 0.5 Mg Caps (Dutasteride) .Marland Kitchen.. 1 By Mouth Once Daily 7)  Antara 130 Mg Caps (Fenofibrate Micronized) .Marland Kitchen.. 1 Once Daily  Allergies (verified): 1)  ! 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 Coclear implant Coronary artery bypass graft x 10-1998 Appendectomy Cholecystectomy Tonsillectomy Colon polypectomy, 10/2007, Dr Russella Dar Parathyroidectomy-1978  Family History: Family History Diabetes 1st degree relative Family History of Diabetes: Mother Melanoma: Father No FH of Colon Cancer:  Social History: Reviewed history from 03/19/2008 and no changes required. Occupation: Retired Patient is a former smoker.  Alcohol Use - no Illicit Drug Use - no Patient gets regular exercise.  Review of Systems       The patient complains of hearing problems.         The pertinent positives and negatives are noted as above and in the HPI. All other ROS were reviewed and were negative.   Vital Signs:  Patient profile:   75 year old male Height:      71 inches Weight:      193 pounds BMI:     27.02 BSA:     2.08 Pulse rate:   62 / minute Pulse rhythm:   regular BP sitting:   128 / 74  (left arm) Cuff size:   regular  Vitals Entered By: Ok Anis CMA (April 03, 2010 2:34 PM)  Physical Exam  General:  Well developed, well nourished, no acute distress. Head:  Normocephalic and atraumatic. Eyes:  PERRLA, no icterus. Ears:  decreased auditory acuity.   Mouth:  No deformity or lesions, dentition normal. Lungs:  Clear throughout to auscultation. Heart:  Regular rate and rhythm; no murmurs, rubs,  or bruits. Abdomen:  Soft, nontender and nondistended. No masses, hepatosplenomegaly or hernias noted. Normal bowel sounds. Extremities:  No clubbing, cyanosis, edema or deformities noted. Neurologic:  Alert and  oriented x4;  grossly normal neurologically. Psych:  Alert and cooperative. Normal mood and affect.  Impression & Recommendations:  Problem # 1:  GERD (ICD-530.81) History GERD and duodenitis. Currently asymptomatic. Taper omeprazole to  discontinue. If his symptoms return we can retreat.  Problem # 2:  PERSONAL HX COLONIC POLYPS (ICD-V12.72) Personal history of a tubulovillous adenoma with high-grade dysplasia in 2004. Surveillance colonoscopy due May 2012.  Problem # 3:  FLATULENCE-GAS-BLOATING (ICD-787.3) Trial of a low gas diet Gas-X as needed and Align for one month.  Patient Instructions: 1)  Your prescription for omeprazole has been sent to your pharmacy.  2)  Take omeprazole one capsule by mouth every other day for 2 weeks then reduce to one capsule by mouth every 3rd day for 2 weeks then discontinue.  3)  Start Align one capsule by mouth once daily x 1 month and samples given.  4)  Start Gas-X four times a day as needed. 5)  Excessive Gas Diet handout given.  6)  Copy sent to : Marga Melnick, MD 7)  The medication list was reviewed and reconciled.  All changed / newly prescribed medications were explained.  A complete medication list was provided to the patient / caregiver.  Prescriptions: OMEPRAZOLE 20 MG  CPDR (OMEPRAZOLE) one capsule by mouth every other day x 2 weeks, reduce to one capsule by mouth every 3rd day x 2 weeks then stop  #15 x 0   Entered by:   Christie Nottingham CMA (AAMA)   Authorized by:   Meryl Dare MD [facility]   Signed by:   Christie Nottingham CMA (AAMA) on 04/03/2010   Method used:   Electronically to        [facility] (retail)       [address]       [place], [place]  [address]       Ph: [phone] or [phone]       Fax: [phone]   RxID:   [id number]

## 2010-07-18 NOTE — Letter (Signed)
Summary: [facility]  [facility]   Imported By: Lanelle Bal 05/01/2010 14:16:01  _____________________________________________________________________  External Attachment:    Type:   Image     Comment:   External Document

## 2010-07-20 ENCOUNTER — Ambulatory Visit: Payer: Federal, State, Local not specified - PPO | Attending: Sports Medicine | Admitting: Physical Therapy

## 2010-07-20 DIAGNOSIS — IMO0001 Reserved for inherently not codable concepts without codable children: Secondary | ICD-10-CM | POA: Insufficient documentation

## 2010-07-20 DIAGNOSIS — M25619 Stiffness of unspecified shoulder, not elsewhere classified: Secondary | ICD-10-CM | POA: Insufficient documentation

## 2010-07-20 DIAGNOSIS — M25519 Pain in unspecified shoulder: Secondary | ICD-10-CM | POA: Insufficient documentation

## 2010-07-20 NOTE — Assessment & Plan Note (Signed)
Summary: rto 4 month/cbs   Vital Signs:  Patient profile:   75 year old male Weight:      194.4 pounds BMI:     27.21 Pulse rate:   60 / minute Resp:     15 per minute BP sitting:   124 / 70  (left arm) Cuff size:   large  Vitals Entered By: Shonna Chock CMA (June 08, 2010 8:56 AM) CC: 4 month follow-up ( copy of labs given) , Lipid Management   Primary Care Provider:  Marga Melnick, MD  CC:  4 month follow-up ( copy of labs given)  and Lipid Management.  History of Present Illness:      This is a 75 year old man who presents for Hyperlipidemia follow-up.  The patient denies muscle aches, GI upset, abdominal pain, flushing, itching, constipation, diarrhea, and fatigue.  The patient denies the following symptoms: chest pain/pressure, exercise intolerance, dypsnea, and palpitations.  Compliance with medications (by patient report) has been near 100%.  Dietary compliance has been fair.  The patient reports no exercise.  Adjunctive measures currently used by the patient include ASA.   Triglycerides 174 , HDL 35.10, LDL 130. A1c is in  NON Diabetes range @ 5.8%, but he may need  injections ( ? steroids).LDL goal with CAD is < 100, ideally < 70, but he had statin induced Pancreatitis. Role of High Fructose Corn Syrup sugar raising TG & uric acid  and lowering HDL discussed.  Lipid Management History:      Positive NCEP/ATP III risk factors include male age 73 years old or older, diabetes, HDL cholesterol less than 40, hypertension, and ASHD (either angina/prior MI/prior CABG).  Negative NCEP/ATP III risk factors include no family history for ischemic heart disease, non-tobacco-user status, no prior stroke/TIA, no peripheral vascular disease, and no history of aortic aneurysm.      Current Medications (verified): 1)  Metoprolol Tartrate 50 Mg Tabs (Metoprolol Tartrate) .... Take One Tablet Twice Daily 2)  Aspirin Adult Low Strength 81 Mg  Tbec (Aspirin) .Marland Kitchen.. 1 By Mouth Once Daily 3)   Accu-Chek Aviva Test Strips .... Check Bloodsugar's Daily As Directed 4)  Allopurinol 100 Mg Tabs (Allopurinol) .Marland Kitchen.. 1 Tab By Mouth  Once Daily 5)  Avodart 0.5 Mg Caps (Dutasteride) .Marland Kitchen.. 1 By Mouth Once Daily 6)  Antara 130 Mg Caps (Fenofibrate Micronized) .Marland Kitchen.. 1 Once Daily  Allergies: 1)  ! Simvastatin (Simvastatin)  Past History:  Past Medical History: DUODENITIS (ICD-535.60) HYPERLIPIDEMIA (ICD-272.2) CORONARY ARTERY DISEASE (ICD-414.00) FASTING HYPERGLYCEMIA (ICD-790.29) GOUT (ICD-274.9) DIVERTICULAR DISEASE (ICD-562.10) Elevated PSA , Dr Aldean Ast Tubulovillous Adenomatous Colon Polyps with high grade dysplasia 10/2002 Pancreatitis , PMH of , Statin induced(Simvastatin0 GERD  Review of Systems MS:  LUE injury may require ?  steroid injections by Dr Murphy/ [facility] Hole.  Physical Exam  General:  well-nourished;alert,appropriate and cooperative throughout examination Lungs:  Normal respiratory effort, chest expands symmetrically. Lungs are clear to auscultation, no crackles or wheezes. Heart:  regular rhythm, no murmur, no gallop, no rub, and bradycardia.  S4 with slurring Pulses:  R and L carotid,radial,dorsalis pedis and posterior tibial pulses are full and equal bilaterally Extremities:  No clubbing, cyanosis, edema. Some chronic toenail changes Neurologic:  alert & oriented X3 and sensation intact to light touch over feet.   Skin:  Intact without suspicious lesions or rashes Psych:  memory intact for recent and remote, normally interactive, and good eye contact.     Impression & Recommendations:  Problem # 1:  PREDIABETES (ICD-790.29) A1c @ goal  Problem # 2:  ESSENTIAL HYPERTENSION, BENIGN (ICD-401.1) mild dehydration ( BUN 33) His updated medication list for this problem includes:    Metoprolol Tartrate 50 Mg Tabs (Metoprolol tartrate) .Marland Kitchen... Take one tablet twice daily  Problem # 3:  HYPERLIPIDEMIA (ICD-272.2) TG elevated , risk discussed The following medications  were removed from the medication list:    Antara 130 Mg Caps (Fenofibrate micronized) .Marland Kitchen... 1 once daily His updated medication list for this problem includes:    Trilipix 135 Mg Cpdr (Choline fenofibrate) .Marland Kitchen... 1 once daily  Problem # 4:  ARM INJURY (ICD-959.2) ? need for steroid injections  Complete Medication List: 1)  Metoprolol Tartrate 50 Mg Tabs (Metoprolol tartrate) .... Take one tablet twice daily 2)  Aspirin Adult Low Strength 81 Mg Tbec (Aspirin) .Marland Kitchen.. 1 by mouth once daily 3)  Accu-chek Aviva Test Strips  .... Check bloodsugar's daily as directed 4)  Allopurinol 100 Mg Tabs (Allopurinol) .Marland Kitchen.. 1 tab by mouth  once daily 5)  Avodart 0.5 Mg Caps (Dutasteride) .Marland Kitchen.. 1 by mouth once daily 6)  Trilipix 135 Mg Cpdr (Choline fenofibrate) .Marland Kitchen.. 1 once daily  Lipid Assessment/Plan:      Based on NCEP/ATP III, the patient's risk factor category is "history of coronary disease, peripheral vascular disease, cerebrovascular disease, or aortic aneurysm along with either diabetes, current smoker, or LDL > 130 plus HDL < 40 plus triglycerides > 200".  The patient's lipid goals are as follows: Total cholesterol goal is 200; LDL cholesterol goal is 130; HDL cholesterol goal is 40; Triglyceride goal is 150.  His LDL cholesterol goal has been met.    Patient Instructions: 1)  Consume < 40 grams of HFCS sugar / day as discussed. 2)  Check your blood sugars regularly, especiallly if steroid injections required . If your readings are usually above :150  or below 90 you should contact our office.Drink to thirst , up to 40 oz / day. 3)  Check your feet each night for sore areas, calluses or signs of infection.Take Trilipix in place of Antara. 4)  Please schedule a follow-up appointment in 11 weeks. 5)  BUN, creat, K+ prior to visit, ICD-9:401.9 6)  Lipid Panel prior to visit, ICD-9:277.7; uric acid , 274.9 Prescriptions: TRILIPIX 135 MG CPDR (CHOLINE FENOFIBRATE) 1 once daily  #70 x 0   Entered and  Authorized by:   Marga Melnick MD   Signed by:   Marga Melnick MD on 06/08/2010   Method used:   Samples Given   RxID:   [id number]    Orders Added: 1)  Est. Patient Level IV [14782]

## 2010-07-21 ENCOUNTER — Ambulatory Visit: Payer: Federal, State, Local not specified - PPO | Admitting: Physical Therapy

## 2010-07-25 ENCOUNTER — Encounter: Payer: Self-pay | Admitting: Physical Therapy

## 2010-07-26 NOTE — Letter (Signed)
Summary: [name]  [name]   Imported By: Lanelle Bal 07/18/2010 13:15:41  _____________________________________________________________________  External Attachment:    Type:   Image     Comment:   External Document

## 2010-07-27 ENCOUNTER — Ambulatory Visit: Payer: Federal, State, Local not specified - PPO | Admitting: Physical Therapy

## 2010-07-28 ENCOUNTER — Encounter: Payer: Self-pay | Admitting: Physical Therapy

## 2010-08-01 ENCOUNTER — Encounter: Payer: Self-pay | Admitting: Physical Therapy

## 2010-08-03 ENCOUNTER — Encounter: Payer: Self-pay | Admitting: Physical Therapy

## 2010-08-04 ENCOUNTER — Encounter: Payer: Self-pay | Admitting: Physical Therapy

## 2010-08-07 ENCOUNTER — Encounter: Payer: Self-pay | Admitting: Internal Medicine

## 2010-08-10 ENCOUNTER — Other Ambulatory Visit: Payer: Self-pay | Admitting: Internal Medicine

## 2010-08-10 ENCOUNTER — Encounter (INDEPENDENT_AMBULATORY_CARE_PROVIDER_SITE_OTHER): Payer: Self-pay | Admitting: *Deleted

## 2010-08-10 ENCOUNTER — Other Ambulatory Visit (INDEPENDENT_AMBULATORY_CARE_PROVIDER_SITE_OTHER): Payer: Federal, State, Local not specified - PPO

## 2010-08-10 DIAGNOSIS — E785 Hyperlipidemia, unspecified: Secondary | ICD-10-CM

## 2010-08-10 DIAGNOSIS — E8881 Metabolic syndrome: Secondary | ICD-10-CM

## 2010-08-10 DIAGNOSIS — M109 Gout, unspecified: Secondary | ICD-10-CM

## 2010-08-10 DIAGNOSIS — I10 Essential (primary) hypertension: Secondary | ICD-10-CM

## 2010-08-10 LAB — CREATININE, SERUM: Creatinine, Ser: 1.6 mg/dL — ABNORMAL HIGH (ref 0.4–1.5)

## 2010-08-10 LAB — LIPID PANEL
Cholesterol: 185 mg/dL (ref 0–200)
HDL: 32.9 mg/dL — ABNORMAL LOW (ref >39.00)
Triglycerides: 214 mg/dL — ABNORMAL HIGH (ref 0.0–149.0)

## 2010-08-10 LAB — LDL CHOLESTEROL, DIRECT: Direct LDL: 121.2 mg/dL

## 2010-08-10 LAB — BUN: BUN: 33 mg/dL — ABNORMAL HIGH (ref 6–23)

## 2010-08-10 LAB — URIC ACID: Uric Acid, Serum: 7.3 mg/dL (ref 4.0–7.8)

## 2010-08-14 ENCOUNTER — Telehealth (INDEPENDENT_AMBULATORY_CARE_PROVIDER_SITE_OTHER): Payer: Self-pay | Admitting: *Deleted

## 2010-08-15 NOTE — Letter (Signed)
Summary: [name]  [name]   Imported By: Maryln Gottron 08/11/2010 10:41:43  _____________________________________________________________________  External Attachment:    Type:   Image     Comment:   External Document

## 2010-08-17 ENCOUNTER — Ambulatory Visit (INDEPENDENT_AMBULATORY_CARE_PROVIDER_SITE_OTHER): Payer: Federal, State, Local not specified - PPO | Admitting: Internal Medicine

## 2010-08-17 ENCOUNTER — Encounter: Payer: Self-pay | Admitting: Internal Medicine

## 2010-08-17 DIAGNOSIS — N259 Disorder resulting from impaired renal tubular function, unspecified: Secondary | ICD-10-CM

## 2010-08-17 DIAGNOSIS — I10 Essential (primary) hypertension: Secondary | ICD-10-CM

## 2010-08-17 DIAGNOSIS — M109 Gout, unspecified: Secondary | ICD-10-CM

## 2010-08-23 ENCOUNTER — Encounter: Payer: Self-pay | Admitting: Cardiovascular Disease

## 2010-08-24 NOTE — Assessment & Plan Note (Signed)
Summary: 10-11 WEEK FU//SPH   Vital Signs:  Patient profile:   75 year old male Weight:      196.8 pounds BMI:     27.55 Pulse rate:   52 / minute Resp:     14 per minute BP sitting:   110 / 78  (left arm) Cuff size:   large  Vitals Entered By: Shonna Chock CMA (August 17, 2010 9:33 AM) CC: Follow-up visit: discuss labs ( copy given) , Dysuria   Primary Care Provider:  Marga Melnick, MD  CC:  Follow-up visit: discuss labs ( copy given)  and Dysuria.  History of Present Illness:    Labs reviewed & risks discussed. His creatinine has risen  from 1.3 in 05/2010  to 1.6 . He is on Allopurinol from me ( uric acid 7.3) & Meloxicam from Dr Margaretha Sheffield. See significant PMH of Obstructive Hydronephrosis.He  reports urinary frequency, but denies burning with urination, urinary hesitancy, and hematuria.  The patient denies the following associated symptoms: abdominal pain and pelvic pain.    Current Medications (verified): 1)  Metoprolol Tartrate 50 Mg Tabs (Metoprolol Tartrate) .... Take One Tablet Twice Daily 2)  Aspirin Adult Low Strength 81 Mg  Tbec (Aspirin) .Marland Kitchen.. 1 By Mouth Once Daily 3)  Accu-Chek Aviva Test Strips .... Check Bloodsugar's Daily As Directed 4)  Avodart 0.5 Mg Caps (Dutasteride) .Marland Kitchen.. 1 By Mouth Once Daily 5)  Trilipix 135 Mg Cpdr (Choline Fenofibrate) .Marland Kitchen.. 1 Once Daily 6)  Meloxicam 15 Mg Tabs (Meloxicam) .Marland Kitchen.. 1 By Mouth Once Daily With Food  Allergies: 1)  ! Simvastatin (Simvastatin)  Past History:  Past Medical History: DUODENITIS (ICD-535.60) HYPERLIPIDEMIA (ICD-272.2) CORONARY ARTERY DISEASE (ICD-414.00) FASTING HYPERGLYCEMIA (ICD-790.29) GOUT (ICD-274.9) DIVERTICULAR DISEASE (ICD-562.10) Elevated PSA , Dr Aldean Ast Tubulovillous Adenomatous Colon Polyps with high grade dysplasia 10/2002 Pancreatitis , PMH of , Statin induced (Simvastatin) GERD Costovertebral Mass; Obstructive Hydronephrosis , Dr Aldean Ast  Physical Exam  General:  in no acute distress;  alert,appropriate and cooperative throughout examination Abdomen:  Bowel sounds positive,abdomen soft and non-tender without masses, organomegaly or hernias noted. No AAA or bruits Pulses:  R and L dorsalis pedis and posterior tibial pulses are full and equal bilaterally Extremities:  No clubbing, cyanosis, edema.   Impression & Recommendations:  Problem # 1:  RENAL INSUFFICIENCY (ICD-588.9) new since 05/2010  Problem # 2:  ESSENTIAL HYPERTENSION, BENIGN (ICD-401.1) controlled His updated medication list for this problem includes:    Metoprolol Tartrate 50 Mg Tabs (Metoprolol tartrate) .Marland Kitchen... Take one tablet twice daily  Problem # 3:  GOUT (ICD-274.9) quiescent  Complete Medication List: 1)  Metoprolol Tartrate 50 Mg Tabs (Metoprolol tartrate) .... Take one tablet twice daily 2)  Aspirin Adult Low Strength 81 Mg Tbec (Aspirin) .Marland Kitchen.. 1 by mouth once daily 3)  Accu-chek Aviva Test Strips  .... Check bloodsugar's daily as directed 4)  Avodart 0.5 Mg Caps (Dutasteride) .Marland Kitchen.. 1 by mouth once daily 5)  Trilipix 135 Mg Cpdr (Choline fenofibrate) .Marland Kitchen.. 1 once daily 6)  Meloxicam 15 Mg Tabs (Meloxicam) .Marland Kitchen.. 1 by mouth once daily with food  Patient Instructions: 1)  Drink to thirst , up to 40 oz of water / day. Stop Allopurinol. 2)  Please schedule a follow-up appointment in 2 months. 3)  BUN, creat, K+, uric acid  prior to visit, ICD-9:588.9, 274.9   Orders Added: 1)  Est. Patient Level III [16109]

## 2010-08-24 NOTE — Progress Notes (Signed)
Summary: Lab Results  Phone Note Outgoing Call Call back at Home Phone [phone]   Call placed by: Shonna Chock CMA,  August 14, 2010 3:18 PM Call placed to: Patient Summary of Call: Spoke with patient's wife, patient with pending appointment on Thursday 08/17/2010  Very significant deterioration in kidney function; creatinine was 1.3 previously.Please STOP Allopurinol. Drink to thirst , up to 40 oz of water / day. Please see me with all meds & supplements. Levester Fresh CMA  August 14, 2010 3:19 PM

## 2010-09-06 ENCOUNTER — Ambulatory Visit: Payer: Self-pay | Admitting: Cardiovascular Disease

## 2010-09-28 ENCOUNTER — Encounter: Payer: Self-pay | Admitting: Cardiovascular Disease

## 2010-09-28 ENCOUNTER — Ambulatory Visit (INDEPENDENT_AMBULATORY_CARE_PROVIDER_SITE_OTHER): Payer: Federal, State, Local not specified - PPO | Admitting: Cardiovascular Disease

## 2010-09-28 DIAGNOSIS — I10 Essential (primary) hypertension: Secondary | ICD-10-CM

## 2010-09-28 DIAGNOSIS — E782 Mixed hyperlipidemia: Secondary | ICD-10-CM

## 2010-09-28 DIAGNOSIS — I2510 Atherosclerotic heart disease of native coronary artery without angina pectoris: Secondary | ICD-10-CM

## 2010-09-28 NOTE — Assessment & Plan Note (Signed)
Stable no angina 

## 2010-09-28 NOTE — Assessment & Plan Note (Signed)
Well controlled.  Continue current medications and low sodium Dash type diet.    

## 2010-09-28 NOTE — Assessment & Plan Note (Signed)
Cholesterol is at goal.  Continue current dose of statin and diet Rx.  No myalgias or side effects.  F/U  LFT's in 6 months. Lab Results  Component Value Date   LDLCALC 130* 06/01/2010  Intolerant to statin  F/U Dr Alwyn Ren

## 2010-09-28 NOTE — Progress Notes (Signed)
Juan [name] is seen today for F/U of CAD with CABG in 2000, and CRF including HTN and elevated cholesterol.  He has been doing well with no SSCP, palpitations, SOB, PND, orthopnea, edema or syncope.  He has been active with his 4 grandchildren and traveling to [place].  He has been compliant with his meds.  His cholesterol has been under good control and followed by Dr. Alwyn Ren.  His last myovue in 02/2007 was low risk with a small inferoseptal infarct and mild periinarct ischemia.  He has a new hearing aid that seems to be working well  ROS: Denies fever, malais, weight loss, blurry vision, decreased visual acuity, cough, sputum, SOB, hemoptysis, pleuritic pain, palpitaitons, heartburn, abdominal pain, melena, lower extremity edema, claudication, or rash.   General: Affect appropriate Healthy:  appears stated age HEENT: normal Neck supple with no adenopathy JVP normal no bruits no thyromegaly Lungs clear with no wheezing and good diaphragmatic motion Heart:  S1/S2 no murmur,rub, gallop or click PMI normal Abdomen: benighn, BS positve, no tenderness, no AAA no bruit.  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 aspirin 81 MG tablet Take 81 mg by mouth daily.        . Choline Fenofibrate (TRILIPIX) 135 MG capsule Take 135 mg by mouth daily.        Marland Kitchen dutasteride (AVODART) 0.5 MG capsule Take 0.5 mg by mouth daily.        Marland Kitchen glucose blood test strip 1 each by Other route as needed. Use as instructed       . meloxicam (MOBIC) 15 MG tablet Take 15 mg by mouth daily.        . metoprolol (LOPRESSOR) 50 MG tablet Take 50 mg by mouth 2 (two) times daily.          Allergies  Simvastatin  Electrocardiogram:  Assessment and Plan

## 2010-09-28 NOTE — Patient Instructions (Signed)
Schedule F/U with Dr. Jesaiah Fabiano in 6 months.  

## 2010-10-10 ENCOUNTER — Other Ambulatory Visit (INDEPENDENT_AMBULATORY_CARE_PROVIDER_SITE_OTHER): Payer: Federal, State, Local not specified - PPO

## 2010-10-10 DIAGNOSIS — M109 Gout, unspecified: Secondary | ICD-10-CM

## 2010-10-10 DIAGNOSIS — N259 Disorder resulting from impaired renal tubular function, unspecified: Secondary | ICD-10-CM

## 2010-10-10 LAB — URIC ACID: Uric Acid, Serum: 7.5 mg/dL (ref 4.0–7.8)

## 2010-10-10 LAB — POTASSIUM: Potassium: 4.4 mEq/L (ref 3.5–5.1)

## 2010-10-10 LAB — CREATININE, SERUM: Creatinine, Ser: 1.5 mg/dL (ref 0.4–1.5)

## 2010-10-16 ENCOUNTER — Encounter: Payer: Self-pay | Admitting: *Deleted

## 2010-10-17 ENCOUNTER — Encounter: Payer: Self-pay | Admitting: Internal Medicine

## 2010-10-17 ENCOUNTER — Ambulatory Visit (INDEPENDENT_AMBULATORY_CARE_PROVIDER_SITE_OTHER): Payer: Federal, State, Local not specified - PPO | Admitting: Internal Medicine

## 2010-10-17 DIAGNOSIS — E782 Mixed hyperlipidemia: Secondary | ICD-10-CM

## 2010-10-17 DIAGNOSIS — M109 Gout, unspecified: Secondary | ICD-10-CM

## 2010-10-17 DIAGNOSIS — I10 Essential (primary) hypertension: Secondary | ICD-10-CM

## 2010-10-17 DIAGNOSIS — N259 Disorder resulting from impaired renal tubular function, unspecified: Secondary | ICD-10-CM

## 2010-10-17 NOTE — Progress Notes (Signed)
  Subjective:    Patient ID: Juan [name], male    DOB: 1936/03/26, 75 y.o.   MRN: [id number]  HPI Serial labs were reviewed & copies given ; his creatinine has decreased from 1.6-1.5. 2 years ago it was 1.2. The BUN remains mildly elevated at 32 despite increasing water intake by 16 oz daily. His uric acid is 7.5; 2 years ago was 9.  He has not had any recent gout attacks.  His prior advanced cholesterol panel(NMR Lipoprofile 2010) was reviewed. The LDL was 172 triglycerides 409. The role of High fructose Corn Syrup sugar  In  raising triglycerides and uric acid was discussed. He states that he is avoiding that  source of sugar.    Review of Systems     Objective:   Physical Exam General appearance is one of good health and nourishment w/o distress.  Heart:  Normal rate and regular rhythm. S1 and S2 normal without gallop, murmur, click, rub .S4 with slight slurring Lungs:Chest clear to auscultation; no wheezes, rhonchi,rales ,or rubs present.No increased work of breathing. BS decreased  Abdomen: bowel sounds normal, soft and non-tender without masses, organomegaly or hernias noted.  No guarding or rebound . No AAA or renal bruits  Skin:Warm & dry.  Intact without suspicious lesions or rashes ; no jaundice or tenting   All pulses intact ;no ischemic skin changes.              Assessment & Plan:  #1 renal insufficiency improved  #2 hyperuricemia improved; no recent gout attacks  #3 dyslipidemia with significant hypertriglyceridemia  Plan: No change will be made in medications; emphasis was placed on restricting high fructose  corn syrup sugar which will be a source for both triglycerides and uric acid

## 2010-10-17 NOTE — Assessment & Plan Note (Signed)
NMR Lipoprofile 2010: LDL 172(2441/1691), HDL 37,TG 306. LDL goal = < 100.

## 2010-10-17 NOTE — Patient Instructions (Signed)
The most common cause of elevated triglycerides is the ingestion of sugar from high fructose corn syrup sources. You should consume less than 40 grams  of sugar per day from foods and drinks with high fructose corn syrup as number 2, 3, or #4 on the label. As TG go up, HDL or good cholesterol goes down. Also uric acid which causes gout will go up. HFCS is not found in products  @ [employer] or [employer].  No changes are recommended medications; after 4-5  months of nutritional dietary changes please repeat fasting labs: lipids, A1c, BUN, creat, uric acid( 272.4,790.29,274.9)

## 2010-10-31 NOTE — Consult Note (Signed)
NAMEMITCHEAL, SWEETIN                  ACCOUNT NO.:  [id number]   MEDICAL RECORD NO.:  [id number]          PATIENT TYPE:  INP   LOCATION:  3706                         FACILITY:  [facility]   PHYSICIAN:  Noralyn Pick. Eden Emms, MD, FACCDATE OF BIRTH:  Dec 29, 1935   DATE OF CONSULTATION:  DATE OF DISCHARGE:                                 CONSULTATION   Juan [name] is a pleasant 75 year old patient we are asked to see for  syncope.  I have just seen the patient in the office yesterday.  He  has a history of coronary artery bypass surgery in 2000.  He had been  doing well without any significant chest pain, palpitations, PND, or  orthopnea.   The patient subsequently was driving with his wife and had severe  abdominal pain.  It appears that he has significant pancreatitis with an  elevated lipase.  Outside CT scan done in [place] suggested a colonic  mass as well.  The patient has had previous pancreatitis in 2000, I am  not sure of the etiology of this.   Currently, he still has some nausea with significant epigastric pain.  He has not vomited.  He has not had followup radiological exam here.   In talking to the patient, he does not have chest pain.  He did not have  palpitations.  He does not think he currently has a heart problem and I  would agree as review of systems is remarkable for no significant  jaundice, no change in bowel habits, no actual vomiting.   His past medical history is remarkable for coronary artery disease,  previous CABG in 2000, pancreatitis in 2000, history of cochlear  implant, history of diabetes.   The patient is happily married.  He was driving in the car with his  wife.  He is a retired Games developer.  He does all activities of daily  living.  Does not smoke or drink.  He has a bit of speech impediment due  to his hearing difficulties.   His medications include Lopressor 50 b.i.d., an aspirin a day, metformin  500 a day, and Zocor 20 a day.   He has no known  allergies.   His family history is noncontributory.   PHYSICAL EXAMINATION:  VITAL SIGNS:  Remarkable for blood pressure  149/96, pulse 85 and regular, temperature 99.1, and sats 96% on 2  liters.  HEENT:  Remarkable for a cochlear implant.  NECK:  Supple.  No lymphadenopathy, thyromegaly, or JVP elevation.  LUNGS:  Clear.  Good diaphragmatic motion.  No wheezing.  HEART:  S1 and S2.  Normal heart sounds.  Status post sternotomy.  PMI  normal.  ABDOMEN:  Significant epigastric pain, which is diffuse in central.  There is no rebound.  There is no AAA.  No hepatosplenomegaly.  No  hepatojugular reflux.  EXTREMITIES:  Distal pulses are intact.  No edema.  NEUROLOGIC:  Nonfocal.  SKIN:  Warm and dry.  MUSCULOSKELETAL:  No muscular weakness.   EKG shows sinus rhythm with poor R-wave progression.  No acute changes.   IMPRESSION:  1. Syncope.  I think this was actually a reaction to his severe      abdominal pain.  There is no evidence of significant arrhythmia.      His enzymes are negative.  I do not think that there is a cardiac      etiology to his syncope.  2. Coronary artery disease, stable.  No acute EKG changes.  Negative      enzymes.  Continue low-dose beta-blocker and a baby aspirin.  3. Pancreatitis.  Unfortunately, it appears that he may have a colonic      mass.  I would be concerned that he has metastatic colon cancer.      He will need further gastrointestinal workup and I suspect imaging.      He continues to have epigastric pain.  I suspect he is n.p.o. and      given his current lipase and previous history, I suspect he will      need TPN or bowel rest for at least 3-4 days.  4. Overall, I think the patient's cardiac status is stable.  I believe      this is all secondary to acute onset of pancreatitis.  Whether it      is secondary to metastatic colon cancer or not, further testing      will decide.      Noralyn Pick. Eden Emms, MD, [facility]  Electronically Signed      PCN/MEDQ  D:  02/20/2008  T:  02/21/2008  Job:  [id number]

## 2010-10-31 NOTE — Assessment & Plan Note (Signed)
[facility]                            CARDIOLOGY OFFICE NOTE   Juan [name], Juan [name]                         MRN:          [id number]  DATE:02/19/2008                            DOB:          11-Sep-1935    Juan [name] returns today for followup.  He is status post previous CABG in  2000.  He had a nonischemic Myoview in September 2008.   He has been doing well.  He is not having significant chest pain, PND,  or orthopnea.  No palpitations or syncope.   His risk factors are fairly well modified.  He is on Zocor for  hypercholesterolemia.  His LDL is in the 90s.   He is not having any side effects in regards to memory changes or  myalgias.   His sugars have been little high.  Apparently, there is some issue with  his home monitor reading running high.  He is following up Dr. Alwyn Ren  for this.  I do not have a hemoglobin A1c, but the patient says his  sugars have been running 140 in the morning.  I told him a more  reasonable target would be 100 if his monitor was functioning correctly.  He is only on metformin once a day and this may need to be adjusted.   Otherwise, he is doing well.  He is looking forward to spending some  time up in [place], where they have a place.  His grandson just got  accepted to [facility] and he was happy about that.   REVIEW OF SYSTEMS:  Otherwise negative.   MEDICATIONS:  1. Lopressor 50 b.i.d.  2. Aspirin a day.  3. Metformin 500 a day.  4. Zocor 20 a day.   PHYSICAL EXAMINATION:  GENERAL:  Remarkable for elderly white male in no  distress.  He has a cochlear implant in place.  VITAL SIGNS:  His weight is 198, blood pressure 130/75, pulse 72 and  regular, respiratory rate 14, and afebrile.  HEENT:  Unremarkable.  NECK:  Carotids are normal without bruit.  No lymphadenopathy,  thyromegaly, or JVP elevation.  LUNGS:  Clear.  Good diaphragmatic motion.  No wheezing.  CARDIAC:  S1 and S2.  Normal heart sounds.  PMI  normal.  ABDOMEN:  Benign.  Bowel sounds positive.  No AAA.  No tenderness.  No  bruit.  No hepatosplenomegaly.  No hepatojugular reflux.  No tenderness.  extremities:  Distal pulses are intact.  No edema.  NEURO:  Nonfocal.  SKIN:  Warm and dry.  No muscular weakness.   EKG shows sinus rhythm with poor R-wave progression.   IMPRESSION:  1. Coronary artery disease, previous coronary artery bypass graft.      Currently, asymptomatic.  Continue aspirin and beta-blocker.  2. Hypercholesterolemia.  Continue statin drug.  LDL cholesterol under      reasonable range.  No side effects.  3. Impaired hearing with cochlear implant.  Follow up at [facility].  4. Hypertension.  Currently, well controlled.  Continue current dose      of beta-blocker.   Overall, Juan [name] is  doing well.  He has nitroglycerin, if he needs it.  I  will see him back in a year.     Noralyn Pick. Eden Emms, MD, [facility]  Electronically Signed    PCN/MedQ  DD: 02/19/2008  DT: 02/20/2008  Job #: [id number]

## 2010-10-31 NOTE — Assessment & Plan Note (Signed)
[facility]                            CARDIOLOGY OFFICE NOTE   Juan [name]                         MRN:          [id number]  DATE:02/21/2007                            DOB:          1936-04-05    [name] returns today for followup.  He has had previous bypass surgery  in 2000.  He had not had a stress test in over 5 years and he is a  diabetic.  He had a stress Myoview today.  He was able to walk 7 minutes  on Bruce protocol.  He had some dyspnea.  Blood pressure was okay.  His  Myoview looked exactly the same as it did in 2003, with an inferior and  inferoseptal wall infarction and no ischemia.  His EF was 53%.  I showed  [name] the pictures and we went over them and compared them to the  ones from 2003.  I was delighted that they were unchanged.   REVIEW OF SYSTEMS:  Remarkable for continued diabetes.  He is starting  to check his sugars at home.  He tells me they run in the 120 range in  the morning.  He is still only on 500 of Glucophage but I suspect that  this will be altered by Dr. Alwyn Ren soon.  He also has had a recent flare  of gout in the left toe and is finishing up a course of colchicine.  He  has a question of a tooth infection on the right side and he has been on  clindamycin.  This is to be stopped in a week.  His review of systems is  negative.   MEDICATIONS:  1. Metoprolol 50 b.i.d.  2. Aspirin daily.  3. Metformin 500 a day.  4. Zocor 20 a day.  5. Colchicine 0.6 b.i.d.  6. Clindamycin 150 t.i.d.   PHYSICAL EXAMINATION:  GENERAL:  Remarkable for an elderly white male in  no distress.  He has an occipital cochlear implant in the right ear.  VITAL SIGNS:  Weight is 192, blood pressure is 130/80, pulse 61 and  regular, respiratory rate is 14.  He is afebrile.  HEENT:  Normal outside of the cochlear implant on the right.  NECK:  Supple.  There is no JVP elevation.  No lymphadenopathy.  No  thyromegaly.  LUNGS:  Clear  with good diaphragmatic motion.  No wheezing.  HEART:  There is an S1 S2 with a soft systolic murmur.  PMI is normal.  ABDOMEN:  Benign.  Bowel sounds positive.  No AAA.  No tenderness.  No  hepatosplenomegaly.  No hepatojugular reflux.  No bruits.  Femorals are  plus 3 bilaterally without bruit.  EXTREMITIES:  PTs are plus 3.  There is no lower extremity edema.  NEUROLOGIC:  Nonfocal.  There is no muscular weakness.  SKIN:  Warm and dry.   IMPRESSION:  1. Coronary disease with a coronary artery bypass graft in 2000,      nonischemic Myoview low risk.  Continue aspirin and beta-blocker.  2. Hypercholesterolemia in the setting of previous  bypass surgery.      Continue Zocor 20 mg a day.  Followup lipid and liver profile per      Dr. Alwyn Ren.  3. Diabetes.  Follow with Dr. Alwyn Ren.  Hemoglobin A1c quarterly.      Again, his sugars appear to be running high to me and he is only on      Glucophage once a day.  I suspect he will have his regimen altered      by Dr. Alwyn Ren soon.  4. Recent gout seems to be resolving.  He is not on a diuretic.      Continue colchicine 0.6 twice a day.  5. Question tooth infection.  Follow up with the dentist.  Continue      clindamycin per Dr. Alwyn Ren.   Juan [name] is overall doing well.  He is not having chest pain.  His Myoview  is nonischemic.  I will see him back in a year.     Noralyn Pick. Eden Emms, MD, [facility]  Electronically Signed    PCN/MedQ  DD: 02/21/2007  DT: 02/21/2007  Job #: [id number]
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
## 2010-11-03 NOTE — Assessment & Plan Note (Signed)
[facility]                            CARDIOLOGY OFFICE NOTE   BOBIE, KISTLER                         MRN:          [id number]  DATE:09/16/2006                            DOB:          September 01, 1935    Garlen is here today in followup.  He is status post CABG, good LV  function.  He is not having any significant chest pain.  His last stress  test was September 2006. He is a newly diagnosed diabetic on Glucophage.  His morning blood sugars have still been in excess of 100.  He is to  follow with Dr. Alwyn Ren for this.   His review of systems otherwise negative.   MEDICATIONS:  Include:  1. Lopressor 50 b.i.d.  2. An aspirin daily.  3. Metformin 500 daily.  4. Zocor 20 daily.   EXAMINATION:  Blood pressure is 130/80, pulse is 59 and regular.  HEENT:  Is normal.  Carotids are normal without bruits.  LUNGS:  Were clear.  There is an S1, S2.  Normal heart sounds.  ABDOMEN:  Is benign.  LOWER EXTREMITIES:  Intact pulses with no edema.  NEURO:  Is nonfocal.  SKIN:  Is warm and dry.   EKG is essentially normal with sinus bradycardia at the rate of  59.   IMPRESSION:  Stable status post coronary artery bypass surgery.  Good  risk factor modification.  Follow up Myoview in September of this year.   The patient will follow with Dr. Alwyn Ren in regards to his hemoglobin  A1C.  I suspect he will need to be on Glucophage at least twice a day,  given the fact that he is a diabetic with old bypass grafts.  I prefer  to do a stress test on him every 2 to 3 years.  He will follow up in  September with me when he has this stress test.  His cholesterol is  being followed by Dr. Alwyn Ren.     Noralyn Pick. Eden Emms, MD, [facility]  Electronically Signed    PCN/MedQ  DD: 09/16/2006  DT: 09/16/2006  Job #: [id number]

## 2010-11-10 ENCOUNTER — Encounter: Payer: Self-pay | Admitting: Gastroenterology

## 2010-12-08 ENCOUNTER — Encounter: Payer: Self-pay | Admitting: Family Medicine

## 2010-12-08 ENCOUNTER — Encounter: Payer: Self-pay | Admitting: Internal Medicine

## 2010-12-08 ENCOUNTER — Ambulatory Visit: Payer: Federal, State, Local not specified - PPO | Admitting: Family

## 2010-12-08 ENCOUNTER — Ambulatory Visit (INDEPENDENT_AMBULATORY_CARE_PROVIDER_SITE_OTHER): Payer: Federal, State, Local not specified - PPO | Admitting: Family Medicine

## 2010-12-08 VITALS — BP 160/92 | HR 71 | Temp 97.4°F | Ht 71.0 in | Wt 192.4 lb

## 2010-12-08 DIAGNOSIS — I10 Essential (primary) hypertension: Secondary | ICD-10-CM

## 2010-12-08 DIAGNOSIS — M109 Gout, unspecified: Secondary | ICD-10-CM

## 2010-12-08 DIAGNOSIS — N259 Disorder resulting from impaired renal tubular function, unspecified: Secondary | ICD-10-CM

## 2010-12-08 MED ORDER — MELOXICAM 15 MG PO TABS: ORAL_TABLET | ORAL | Status: DC

## 2010-12-08 MED ORDER — COLCHICINE 0.6 MG PO TABS: ORAL_TABLET | ORAL | Status: DC

## 2010-12-08 NOTE — Patient Instructions (Signed)
Gout Gout is an inflammatory condition (arthritis) caused by a buildup of uric acid crystals in the joints. Uric acid is a chemical that is normally present in the blood. Under some circumstances, uric acid can form into crystals in your joints. This causes joint redness, soreness, and swelling (inflammation). Repeat attacks are common. Over time, uric acid crystals can form into masses (tophi) near a joint, causing disfigurement. Gout is treatable and often preventable. CAUSES The disease begins with elevated levels of uric acid in the blood. Uric acid is produced by your body when it breaks down a naturally found substance called purines. This also happens when you eat certain foods such as meats and fish. Causes of an elevated uric acid level include:  Being passed down from parent to child (heredity).   Diseases that cause increased uric acid production (obesity, psoriasis, some cancers).   Excessive alcohol use.   Diet, especially diets rich in meat and seafood.   Medicines, including certain cancer-fighting drugs (chemotherapy), diuretics, and aspirin.   Chronic kidney disease. The kidneys are no longer able to remove uric acid well.   Problems with metabolism.  Conditions strongly associated with gout include:  Obesity.   High blood pressure.   High cholesterol.   Diabetes.  Not everyone with elevated uric acid levels gets gout. It is not understood why some people get gout and others do not. Surgery, joint injury, and eating too much of certain foods are some of the factors that can lead to gout. SYMPTOMS  An attack of gout comes on quickly. It causes intense pain with redness, swelling, and warmth in a joint.   Fever can occur.   Often, only one joint is involved. Certain joints are more commonly involved:   Base of the big toe.   Knee.   Ankle.   Wrist.   Finger.  Without treatment, an attack usually goes away in a few days to weeks. Between attacks, you usually  will not have symptoms, which is different from many other forms of arthritis. DIAGNOSIS Your caregiver will suspect gout based on your symptoms and exam. Removal of fluid from the joint (arthrocentesis) is done to check for uric acid crystals. Your caregiver will give you a medicine that numbs the area (local anesthetic) and use a needle to remove joint fluid for exam. Gout is confirmed when uric acid crystals are seen in joint fluid, using a special microscope. Sometimes, blood, urine, and X-ray tests are also used. TREATMENT There are 2 phases to gout treatment: treating the sudden onset (acute) attack and preventing attacks (prophylaxis). Treatment of an Acute Attack  Medicines are used. These include anti-inflammatory medicines or steroid medicines.   An injection of steroid medicine into the affected joint is sometimes necessary.   The painful joint is rested. Movement can worsen the arthritis.   You may use warm or cold treatments on painful joints, depending which works best for you.   Discuss the use of coffee, vitamin C, or cherries with your caregiver. These may be helpful treatment options.  Treatment to Prevent Attacks After the acute attack subsides, your caregiver may advise prophylactic medicine. These medicines either help your kidneys eliminate uric acid from your body or decrease your uric acid production. You may need to stay on these medicines for a very long time. The early phase of treatment with prophylactic medicine can be associated with an increase in acute gout attacks. For this reason, during the first few months of treatment, your caregiver  may also advise you to take medicines usually used for acute gout treatment. Be sure you understand your caregiver's directions. You should also discuss dietary treatment with your caregiver. Certain foods such as meats and fish can increase uric acid levels. Other foods such as dairy can decrease levels. Your caregiver can give  you a list of foods to avoid. HOME CARE INSTRUCTIONS  Do not take aspirin to relieve pain. This raises uric acid levels.   Only take over-the-counter or prescription medicines for pain, discomfort, or fever as directed by your caregiver.   Rest the joint as much as possible. When in bed, keep sheets and blankets off painful areas.   Keep the affected joint raised (elevated).   Use crutches if the painful joint is in your leg.   Drink enough water and fluids to keep your urine clear or pale yellow. This helps your body get rid of uric acid. Do not drink alcoholic beverages. They slow the passage of uric acid.   Follow your caregiver's dietary instructions. Pay careful attention to the amount of protein you eat. Your daily diet should emphasize fruits, vegetables, whole grains, and fat-free or low-fat milk products.   Maintain a healthy body weight.  SEEK MEDICAL CARE IF:  You have an oral temperature above 104.   You develop diarrhea, vomiting, or any side effects from medicines.   You do not feel better in 24 hours, or you are getting worse.  SEEK IMMEDIATE MEDICAL CARE IF:  Your joint becomes suddenly more tender and you have:   Chills.   An oral temperature above 104, not controlled by medicine.  MAKE SURE YOU:  Understand these instructions.   Will watch your condition.   Will get help right away if you are not doing well or get worse.  Document Released: 06/01/2000 Document Re-Released: 11/22/2009 [facility] Patient Information 2011 [place], [place].

## 2010-12-08 NOTE — Assessment & Plan Note (Signed)
Patient with a roughly 3 day history of gouty flare. Is having pain along the lateral aspect of his right foot. He reports he was out mowing the lawn he came back in with pain redness and warm. It is somewhat improved today as compared to yesterday but still uncomfortable. He is able to get a shoe on. Reviewing his chart he has had meloxicam in the past will be given for breakthrough pain. He will use Colchicine 0.6mg  pi bid x 3 days at present to break his current flare and is encouraged to push clear fluids over the weekend. He will follow up with PMD if pain persists.

## 2010-12-08 NOTE — Progress Notes (Signed)
Juan [name] [id number] 1936-01-25 12/08/2010      Progress Note-Follow Up  Subjective  Chief Complaint  Chief Complaint  Patient presents with  . Gout    flare up x 2 days, right foot    HPI  Patient is a 75 year old Caucasian male who is in today for evaluation of right foot pain. He was mowing the grass roughly 3 days ago when he came in he had acute pain on the lateral aspect of his right foot. It has been warm red and tender to the touch. He has a history of gout. He has no medications at home so as not tried any medications. He has been pushing fluids. He has taken allopurinol block in the past is not presently taking them. He denies any febrile illness, chest pain, palpitations, shortness of breath, GI or GU complaints today  Past Medical History  Diagnosis Date  . Hyperlipidemia   . Coronary artery disease   . Duodenitis   . Fasting hyperglycemia   . Gout   . Diverticular disease   . Elevated PSA   . Tubular adenoma polyp of rectum   . Pancreatitis   . GERD (gastroesophageal reflux disease)   . Hydronephrosis with ureteropelvic junction obstruction     Past Surgical History  Procedure Date  . Cochlear implant   . Coronary artery bypass graft 2000    x 5  . Appendectomy   . Cholecystectomy   . Tonsillectomy   . Colonoscopy w/ polypectomy   . Parathyroidectomy 1978    Family History  Problem Relation Age of Onset  . Diabetes Mother   . Melanoma Father     History   Social History  . Marital Status: Married    Spouse Name: N/A    Number of Children: N/A  . Years of Education: N/A   Occupational History  . retired    Social History Main Topics  . Smoking status: Former Smoker    Types: Cigars  . Smokeless tobacco: Not on file  . Alcohol Use: No  . Drug Use: No  . Sexually Active: Not on file   Other Topics Concern  . Not on file   Social History Narrative   Does get regular exercise    Current Outpatient Prescriptions on File Prior to  Visit  Medication Sig Dispense Refill  . aspirin 81 MG tablet Take 81 mg by mouth daily.        . Choline Fenofibrate (TRILIPIX) 135 MG capsule Take 135 mg by mouth daily.        Marland Kitchen dutasteride (AVODART) 0.5 MG capsule Take 0.5 mg by mouth daily.        Marland Kitchen glucose blood test strip 1 each by Other route as needed. Use as instructed       . metoprolol (LOPRESSOR) 50 MG tablet Take 50 mg by mouth 2 (two) times daily.        Marland Kitchen allopurinol (ZYLOPRIM) 100 MG tablet Take 100 mg by mouth daily.        Marland Kitchen DISCONTD: meloxicam (MOBIC) 15 MG tablet Take 15 mg by mouth daily.          Allergies  Allergen Reactions  . Simvastatin     REACTION: statin induced pancreatitis    Review of Systems  Review of Systems  Constitutional: Negative for fever and malaise/fatigue.  HENT: Negative for hearing loss and congestion.   Eyes: Negative for discharge.  Respiratory: Negative for shortness of breath.   Cardiovascular:  Negative for chest pain, palpitations and leg swelling.  Gastrointestinal: Negative for nausea, abdominal pain and diarrhea.  Genitourinary: Negative for dysuria.  Musculoskeletal: Positive for joint pain. Negative for falls.       Right foot pain over 5th metatarsal  Skin: Negative for rash.  Neurological: Negative for loss of consciousness and headaches.  Endo/Heme/Allergies: Negative for polydipsia.  Psychiatric/Behavioral: Negative for depression and suicidal ideas. The patient is not nervous/anxious and does not have insomnia.     Objective  BP 160/92  Pulse 71  Temp(Src) 97.4 F (36.3 C) (Oral)  Ht 5\' 11"  (1.803 m)  Wt 192 lb 6.4 oz (87.272 kg)  BMI 26.83 kg/m2  SpO2 95%  Physical Exam  Physical Exam  Constitutional: He is oriented to person, place, and time and well-developed, well-nourished, and in no distress. No distress.  HENT:  Head: Normocephalic and atraumatic.  Eyes: Conjunctivae are normal.  Neck: Neck supple. No thyromegaly present.  Cardiovascular: Normal  rate, regular rhythm and normal heart sounds.   No murmur heard. Pulmonary/Chest: Effort normal and breath sounds normal. No respiratory distress.  Abdominal: He exhibits no distension and no mass. There is no tenderness.  Musculoskeletal: He exhibits tenderness. He exhibits no edema.       Mild erythema and tenderness over 5th metatarsal right foot  Neurological: He is alert and oriented to person, place, and time.  Skin: Skin is warm.  Psychiatric: Memory, affect and judgment normal.    Lab Results  Component Value Date   TSH 1.25 05/16/2006   Lab Results  Component Value Date   WBC 4.5 05/16/2006   HGB 15.0 05/16/2006   HCT 43.4 05/16/2006   MCV 90.7 05/16/2006   PLT 249 05/16/2006   Lab Results  Component Value Date   CREATININE 1.5 10/10/2010   BUN 32* 10/10/2010   NA 139 05/16/2006   K 4.4 10/10/2010   CL 104 05/16/2006   CO2 27 05/16/2006   Lab Results  Component Value Date   ALT 20 01/18/2010   AST 25 01/18/2010   ALKPHOS 37* 01/18/2010   BILITOT 0.9 01/18/2010   Lab Results  Component Value Date   CHOL 185 08/10/2010   Lab Results  Component Value Date   HDL 32.90* 08/10/2010   Lab Results  Component Value Date   LDLCALC 130* 06/01/2010   Lab Results  Component Value Date   TRIG 214.0* 08/10/2010   Lab Results  Component Value Date   CHOLHDL 6 08/10/2010     Assessment & Plan  GOUT Patient with a roughly 3 day history of gouty flare. Is having pain along the lateral aspect of his right foot. He reports he was out mowing the lawn he came back in with pain redness and warm. It is somewhat improved today as compared to yesterday but still uncomfortable. He is able to get a shoe on. Reviewing his chart he has had meloxicam in the past will be given for breakthrough pain. He will use Colchicine 0.6mg  pi bid x 3 days at present to break his current flare and is encouraged to push clear fluids over the weekend. He will follow up with PMD if pain  persists.  ESSENTIAL HYPERTENSION, BENIGN Elevated today secondary to pain and patient having trouble finding the office. No related c/o. No change in medications today  RENAL INSUFFICIENCY Mild, reviewed patient labs, may need repeat uric acid level and renal panel if pain persists

## 2010-12-08 NOTE — Assessment & Plan Note (Signed)
Mild, reviewed patient labs, may need repeat uric acid level and renal panel if pain persists

## 2010-12-08 NOTE — Assessment & Plan Note (Signed)
Elevated today secondary to pain and patient having trouble finding the office. No related c/o. No change in medications today

## 2010-12-12 ENCOUNTER — Ambulatory Visit (AMBULATORY_SURGERY_CENTER): Payer: Federal, State, Local not specified - PPO

## 2010-12-12 VITALS — Ht 71.0 in | Wt 194.0 lb

## 2010-12-12 DIAGNOSIS — Z86010 Personal history of colonic polyps: Secondary | ICD-10-CM

## 2010-12-12 MED ORDER — PEG-KCL-NACL-NASULF-NA ASC-C 100 G PO SOLR: 1.0000 | Freq: Once | ORAL | Status: AC

## 2010-12-26 ENCOUNTER — Encounter: Payer: Self-pay | Admitting: Gastroenterology

## 2010-12-26 ENCOUNTER — Ambulatory Visit (AMBULATORY_SURGERY_CENTER): Payer: Federal, State, Local not specified - PPO | Admitting: Gastroenterology

## 2010-12-26 VITALS — BP 141/93 | HR 51 | Temp 97.0°F | Resp 17 | Ht 71.0 in | Wt 182.0 lb

## 2010-12-26 DIAGNOSIS — D126 Benign neoplasm of colon, unspecified: Secondary | ICD-10-CM

## 2010-12-26 DIAGNOSIS — K5730 Diverticulosis of large intestine without perforation or abscess without bleeding: Secondary | ICD-10-CM

## 2010-12-26 DIAGNOSIS — Z1211 Encounter for screening for malignant neoplasm of colon: Secondary | ICD-10-CM

## 2010-12-26 DIAGNOSIS — Z86010 Personal history of colonic polyps: Secondary | ICD-10-CM

## 2010-12-26 MED ORDER — SODIUM CHLORIDE 0.9 % IV SOLN: 500.0000 mL | INTRAVENOUS | Status: DC

## 2010-12-26 NOTE — Progress Notes (Signed)
PT HAS COCHLEAR IMPLANT RT SIDE-PLEASE BE CAREFUL OF MAGNETS DUE TO METALS IN HIS HEAD- WILL PULL THIS OUT OF HEAD PER PATIENT--[facility] RN

## 2010-12-26 NOTE — Patient Instructions (Signed)
DISCHARGE INSTRUCTIONS GIVEN WITH VERBAL UNDERTSTANDING. HANDOUTS ON POLYPS, DIVERTICULOSIS AND HEMORRHOIDS GIVEN. RESUME PREVIOUS MEDICATIONS.

## 2010-12-27 ENCOUNTER — Telehealth: Payer: Self-pay

## 2010-12-27 NOTE — Telephone Encounter (Signed)

## 2011-01-02 ENCOUNTER — Encounter: Payer: Self-pay | Admitting: Gastroenterology

## 2011-01-03 ENCOUNTER — Other Ambulatory Visit: Payer: Self-pay | Admitting: Cardiovascular Disease

## 2011-02-27 ENCOUNTER — Other Ambulatory Visit: Payer: Self-pay | Admitting: Internal Medicine

## 2011-02-27 DIAGNOSIS — R7309 Other abnormal glucose: Secondary | ICD-10-CM

## 2011-02-27 DIAGNOSIS — M109 Gout, unspecified: Secondary | ICD-10-CM

## 2011-02-27 DIAGNOSIS — E785 Hyperlipidemia, unspecified: Secondary | ICD-10-CM

## 2011-02-28 ENCOUNTER — Other Ambulatory Visit (INDEPENDENT_AMBULATORY_CARE_PROVIDER_SITE_OTHER): Payer: Federal, State, Local not specified - PPO

## 2011-02-28 DIAGNOSIS — M109 Gout, unspecified: Secondary | ICD-10-CM

## 2011-02-28 DIAGNOSIS — E785 Hyperlipidemia, unspecified: Secondary | ICD-10-CM

## 2011-02-28 DIAGNOSIS — R7309 Other abnormal glucose: Secondary | ICD-10-CM

## 2011-02-28 LAB — CREATININE, SERUM: Creatinine, Ser: 1.5 mg/dL (ref 0.4–1.5)

## 2011-02-28 LAB — BUN: BUN: 27 mg/dL — ABNORMAL HIGH (ref 6–23)

## 2011-02-28 LAB — LIPID PANEL
LDL Cholesterol: 123 mg/dL — ABNORMAL HIGH (ref 0–99)
Total CHOL/HDL Ratio: 5
Triglycerides: 184 mg/dL — ABNORMAL HIGH (ref 0.0–149.0)

## 2011-02-28 NOTE — Progress Notes (Signed)
Labs only

## 2011-03-02 NOTE — Progress Notes (Signed)
Labs only

## 2011-03-09 ENCOUNTER — Ambulatory Visit (INDEPENDENT_AMBULATORY_CARE_PROVIDER_SITE_OTHER): Payer: Federal, State, Local not specified - PPO | Admitting: Internal Medicine

## 2011-03-09 DIAGNOSIS — I10 Essential (primary) hypertension: Secondary | ICD-10-CM

## 2011-03-09 DIAGNOSIS — I2510 Atherosclerotic heart disease of native coronary artery without angina pectoris: Secondary | ICD-10-CM

## 2011-03-09 DIAGNOSIS — E782 Mixed hyperlipidemia: Secondary | ICD-10-CM

## 2011-03-09 DIAGNOSIS — R7309 Other abnormal glucose: Secondary | ICD-10-CM

## 2011-03-09 DIAGNOSIS — M109 Gout, unspecified: Secondary | ICD-10-CM

## 2011-03-09 MED ORDER — PRAVASTATIN SODIUM 20 MG PO TABS: 20.0000 mg | ORAL_TABLET | ORAL | Status: DC

## 2011-03-09 NOTE — Patient Instructions (Signed)
Your BP goal = AVERAGE < 135/85. Avoid ingestion of  excess salt/sodium.Cook with pepper & other spices . Use the salt substitute "No Salt"(unless your potassium has been elevated) OR the Mrs Sharilyn Sites products to season food @ the table. Avoid foods which taste salty or "vinegary" as their sodium contentet will be high.  Eat a low-fat diet with lots of fruits and vegetables, up to 7-9 servings per day. Avoid obesity; your goal is waist measurement < 40 inches.Consume less than 40 grams of sugar per day from foods & drinks with High Fructose Corn Sugar as #1,2,3 or # 4 on label. Follow the low carb nutrition program in [employer] as closely as possible to prevent Diabetes progression & complications. White carbohydrates (potatoes, rice, bread, and pasta) have a high spike of sugar and a high load of sugar. For example a  baked potato has a cup of sugar and a  french fry  2 teaspoons of sugar. Yams, wild  rice, whole grained bread &  wheat pasta have been much lower spike and load of  sugar. Portions should be the size of a deck of cards or your palm.  Please  schedule fasting Labs in 10 weeks : BMET,Lipids, hepatic panel, CK ,uric acid (274.9, 272.4, 995.20)

## 2011-03-09 NOTE — Progress Notes (Signed)
Subjective:    Patient ID: Juan [name], male    DOB: [birth year], 75 y.o.   MRN: [id number]  HPI#1  HYPERTENSION: Disease Monitoring: Blood pressure range-not monitored  Chest pain, palpitations- no       Dyspnea- no Medications: Compliance- yes (Metoprolol)  Lightheadedness,Syncope- no    Edema- no  #2 FASTING HYPERGLYCEMIA: A1c 6% Disease Monitoring: Blood Sugar ranges-not monitored  Polyuria/phagia/dipsia- no      Visual problems- no; last Ophth exam 12 mos ago Medications: Compliance- no meds  Hypoglycemic symptoms- no  #3 HYPERLIPIDEMIA: Disease Monitoring: See symptoms for Hypertension Medication compliance- yes Trilipix)  Abd pain, bowel changes- no  Diet: low sugar ( TG 184, previously 306 in 2010; uric acid 8.3))  Muscle aches- no    Smoking Status :rare cigar       Review of Systems     Objective:   Physical Exam Gen.: Healthy and well-nourished in appearance. Alert, appropriate and cooperative throughout exam. Eyes: No corneal or conjunctival inflammation noted. Ears:  Hearing is grossly decreased . Cochlear implant on R Neck: No deformities, masses, or tenderness noted. Thyroid normal. Lungs: Normal respiratory effort; chest expands symmetrically. Lungs are clear to auscultation without rales, wheezes, or increased work of breathing. Heart: Normal rate and rhythm. Normal S1 and S2. No gallop, click, or rub. Grade 1/2-1 R base systolic  murmur. Abdomen: Bowel sounds normal; abdomen soft and nontender. No masses, organomegaly or hernias noted.                                                                                   Musculoskeletal/extremities:  No clubbing, cyanosis, edema, or deformity noted. Joints normal. Nail health  good. Vascular: Carotid, radial artery, dorsalis pedis and  posterior tibial pulses are full and equal. No bruits present. Neurologic: Alert and oriented x3. Deep tendon reflexes symmetrical and normal.          Skin: Intact  without suspicious lesions or rashes. Lymph: No cervical, axillary lymphadenopathy present. Psych: Mood and affect are normal. Normally interactive                                                                                        Assessment & Plan:  #1 dyslipidemia; LDL is not at goal of less than 100, ideally less than 70. Triglycerides remain elevated. There is a history of  Pancreatitis possibly from a statin. The more likely etiology would be extremely high triglycerides.  #2 fasting hyperglycemia; A1c in nondiabetic range  #3 hypertension, minimal elevation  #4 hyperuricemia; this is most likely related to elevated triglycerides. The usual or  most common source would be high fructose corn  Sugar in foods and drinks.  Plan: #1 monitor blood pressure; and low-dose ACE inhibitor if it remains elevated  #2 consider low-dose biweekly statin  #3 avoidance  of high fusiform stricture totally

## 2011-03-21 LAB — LIPID PANEL
Cholesterol: 105
LDL Cholesterol: 57
Triglycerides: 95

## 2011-03-21 LAB — COMPREHENSIVE METABOLIC PANEL
ALT: 17
ALT: 24
Albumin: 3.7
BUN: 29 — ABNORMAL HIGH
Calcium: 8.8
Calcium: 9.2
GFR calc Af Amer: >60
Glucose, Bld: 127 — ABNORMAL HIGH
Glucose, Bld: 181 — ABNORMAL HIGH
Sodium: 141
Sodium: 141
Total Protein: 5.5 — ABNORMAL LOW
Total Protein: 5.9 — ABNORMAL LOW

## 2011-03-21 LAB — GLUCOSE, CAPILLARY
Glucose-Capillary: 108 — ABNORMAL HIGH
Glucose-Capillary: 108 — ABNORMAL HIGH
Glucose-Capillary: 110 — ABNORMAL HIGH
Glucose-Capillary: 111 — ABNORMAL HIGH
Glucose-Capillary: 130 — ABNORMAL HIGH
Glucose-Capillary: 133 — ABNORMAL HIGH
Glucose-Capillary: 139 — ABNORMAL HIGH
Glucose-Capillary: 147 — ABNORMAL HIGH
Glucose-Capillary: 178 — ABNORMAL HIGH

## 2011-03-21 LAB — CBC
Hemoglobin: 13.7
Hemoglobin: 14.5
MCHC: 34.6
MCHC: 35
Platelets: 200
RDW: 14.2
RDW: 14.3

## 2011-03-21 LAB — POTASSIUM: Potassium: 3.6

## 2011-03-21 LAB — DIFFERENTIAL
Lymphs Abs: 0.4 — ABNORMAL LOW
Monocytes Relative: 4
Neutro Abs: 7.7
Neutrophils Relative %: 91 — ABNORMAL HIGH

## 2011-03-21 LAB — LIPASE, BLOOD
Lipase: 146 — ABNORMAL HIGH
Lipase: 53
Lipase: 75 — ABNORMAL HIGH

## 2011-03-21 LAB — B-NATRIURETIC PEPTIDE (CONVERTED LAB)
Pro B Natriuretic peptide (BNP): 70
Pro B Natriuretic peptide (BNP): <30

## 2011-03-21 LAB — CARDIAC PANEL(CRET KIN+CKTOT+MB+TROPI)
CK, MB: 2.5
CK, MB: 4.2 — ABNORMAL HIGH
Relative Index: 1.6
Relative Index: 3.7 — ABNORMAL HIGH
Total CK: 110
Troponin I: 0.01
Troponin I: 0.01

## 2011-03-21 LAB — AMYLASE: Amylase: 18 — ABNORMAL LOW

## 2011-03-21 LAB — PROTIME-INR
INR: 1.2
Prothrombin Time: 15.7 — ABNORMAL HIGH

## 2011-03-21 LAB — HEPATIC FUNCTION PANEL
Indirect Bilirubin: 1.7 — ABNORMAL HIGH
Total Protein: 6

## 2011-03-27 ENCOUNTER — Encounter: Payer: Self-pay | Admitting: Cardiovascular Disease

## 2011-03-27 ENCOUNTER — Ambulatory Visit (INDEPENDENT_AMBULATORY_CARE_PROVIDER_SITE_OTHER): Payer: Federal, State, Local not specified - PPO | Admitting: Cardiovascular Disease

## 2011-03-27 DIAGNOSIS — I2510 Atherosclerotic heart disease of native coronary artery without angina pectoris: Secondary | ICD-10-CM

## 2011-03-27 DIAGNOSIS — I10 Essential (primary) hypertension: Secondary | ICD-10-CM

## 2011-03-27 DIAGNOSIS — E782 Mixed hyperlipidemia: Secondary | ICD-10-CM

## 2011-03-27 MED ORDER — PRAVASTATIN SODIUM 20 MG PO TABS: 20.0000 mg | ORAL_TABLET | Freq: Every day | ORAL | Status: DC

## 2011-03-27 NOTE — Assessment & Plan Note (Signed)
Stable with no angina and ECG no change  Continue ASA and beta blocker

## 2011-03-27 NOTE — Progress Notes (Signed)
Juan [name] is seen today for F/U of CAD with CABG in 2000, and CRF including HTN and elevated cholesterol. He has been doing well with no SSCP, palpitations, SOB, PND, orthopnea, edema or syncope. He has been active with his 4 grandchildren and traveling to [place]. He has been compliant with his meds. His cholesterol has been under good control and followed by Dr. Alwyn Ren. His last myovue in 02/2007 was low risk with a small inferoseptal infarct and mild periinarct ischemia. He has a new hearing aid that seems to be working well  ROS: Denies fever, malais, weight loss, blurry vision, decreased visual acuity, cough, sputum, SOB, hemoptysis, pleuritic pain, palpitaitons, heartburn, abdominal pain, melena, lower extremity edema, claudication, or rash.  All other systems reviewed and negative  General: Affect appropriate Healthy:  appears stated age HEENT: normal Neck supple with no adenopathy JVP normal no bruits no thyromegaly Lungs clear with no wheezing and good diaphragmatic motion Heart:  S1/S2 no murmur,rub, gallop or click PMI normal Abdomen: benighn, BS positve, no tenderness, no AAA no bruit.  No HSM or HJR Distal pulses intact with no bruits No edema Neuro non-focal Skin warm and dry No muscular weakness   Current Outpatient Prescriptions  Medication Sig Dispense Refill  . aspirin 81 MG tablet Take 81 mg by mouth daily.        . Choline Fenofibrate (TRILIPIX) 135 MG capsule Take 135 mg by mouth daily.        Marland Kitchen dutasteride (AVODART) 0.5 MG capsule Take 0.5 mg by mouth daily.        Marland Kitchen glucose blood test strip 1 each by Other route as needed. Use as instructed       . metoprolol (LOPRESSOR) 50 MG tablet TAKE 1 TABLET BY MOUTH TWICE A DAY  60 tablet  5  . pravastatin (PRAVACHOL) 20 MG tablet Take 1 tablet (20 mg total) by mouth as directed. Take Trilipix  in the morning; take pravastatin 20 mg at bedtime Monday Wednesday and Friday only  30 tablet  0   Current Facility-Administered  Medications  Medication Dose Route Frequency Provider Last Rate Last Dose  . 0.9 %  sodium chloride infusion  500 mL Intravenous Continuous Meryl Dare, MD,FACG        Allergies  Simvastatin  Electrocardiogram:  SB 53 Low voltage nonspecific lateral T wave changes  Assessment and Plan

## 2011-03-27 NOTE — Patient Instructions (Signed)
Your physician wants you to follow-up in: 6 months You will receive a reminder letter in the mail two months in advance. If you don't receive a letter, please call our office to schedule the follow-up appointment.   INCREASE PRAVASTATIN 20 MG TAKE ONE TABLET EVERYDAY  Your physician recommends that you return for lab work in: 3 MONTHS

## 2011-03-27 NOTE — Assessment & Plan Note (Signed)
Well controlled.  Continue current medications and low sodium Dash type diet.    

## 2011-03-27 NOTE — Assessment & Plan Note (Signed)
F/U labs in November at [facility]  Statin daily  With trilipix.  Doubt previous pancreatitis was from statin.

## 2011-03-28 ENCOUNTER — Other Ambulatory Visit: Payer: Self-pay | Admitting: Internal Medicine

## 2011-03-28 NOTE — Telephone Encounter (Signed)
Message copied by Mikey Bussing on Wed Mar 28, 2011  2:33 PM ------      Message from: Edgardo Roys      Created: Wed Mar 28, 2011 11:36 AM                   ----- Message -----         From: Deliah Goody, RN         Sent: 03/27/2011  11:09 AM           To: Pecola Lawless, MD, Stephan Minister, CMA            Hello, hope you are well. Dr Eden Emms saw Fayrene Fearing Altizer today and increased his pravachol to daily. You already have him scheduled for labs at the end of November and we would like to add a amylase and lipase to those labs please. The pt is going to call me if he has any problems. Thanks for your help call me at [phone] if needed.

## 2011-03-28 NOTE — Telephone Encounter (Signed)
Converted to phone note.

## 2011-03-29 ENCOUNTER — Other Ambulatory Visit: Payer: Self-pay | Admitting: Internal Medicine

## 2011-04-13 ENCOUNTER — Ambulatory Visit (INDEPENDENT_AMBULATORY_CARE_PROVIDER_SITE_OTHER): Payer: Federal, State, Local not specified - PPO

## 2011-04-13 DIAGNOSIS — Z23 Encounter for immunization: Secondary | ICD-10-CM

## 2011-04-15 ENCOUNTER — Other Ambulatory Visit: Payer: Self-pay | Admitting: Internal Medicine

## 2011-04-18 ENCOUNTER — Other Ambulatory Visit: Payer: Self-pay | Admitting: *Deleted

## 2011-04-18 DIAGNOSIS — E782 Mixed hyperlipidemia: Secondary | ICD-10-CM

## 2011-04-18 DIAGNOSIS — I2510 Atherosclerotic heart disease of native coronary artery without angina pectoris: Secondary | ICD-10-CM

## 2011-04-18 MED ORDER — PRAVASTATIN SODIUM 20 MG PO TABS: 20.0000 mg | ORAL_TABLET | Freq: Every day | ORAL | Status: DC

## 2011-04-20 ENCOUNTER — Telehealth: Payer: Self-pay | Admitting: Cardiovascular Disease

## 2011-04-20 NOTE — Telephone Encounter (Signed)
New  Message  Pt has started taking pravastatin 20 mg and he is having stomach pains.  Please call

## 2011-04-20 NOTE — Telephone Encounter (Signed)
11/2--PT calling stating since starting pravastatin on daily basis has had constant stomach pain(has previously been on pravastatin mon., wed., and fri.,) --is taking in evening with meal and has not had this problem before--advised to go back to mno,wed, fri and i would let dr Eden Emms and debra know, and if they want to change med, they will call him--pt agrees--nt

## 2011-04-23 NOTE — Telephone Encounter (Signed)
Spoke with pt wife, okay given for pt to cont on the pravastatin three days weekly. Will discuss at routine follow up Deliah Goody

## 2011-05-16 ENCOUNTER — Other Ambulatory Visit: Payer: Self-pay | Admitting: Internal Medicine

## 2011-05-16 DIAGNOSIS — E785 Hyperlipidemia, unspecified: Secondary | ICD-10-CM

## 2011-05-16 DIAGNOSIS — T887XXA Unspecified adverse effect of drug or medicament, initial encounter: Secondary | ICD-10-CM

## 2011-05-16 DIAGNOSIS — M109 Gout, unspecified: Secondary | ICD-10-CM

## 2011-05-18 ENCOUNTER — Other Ambulatory Visit (INDEPENDENT_AMBULATORY_CARE_PROVIDER_SITE_OTHER): Payer: Federal, State, Local not specified - PPO

## 2011-05-18 DIAGNOSIS — E785 Hyperlipidemia, unspecified: Secondary | ICD-10-CM

## 2011-05-18 DIAGNOSIS — T887XXA Unspecified adverse effect of drug or medicament, initial encounter: Secondary | ICD-10-CM

## 2011-05-18 DIAGNOSIS — M109 Gout, unspecified: Secondary | ICD-10-CM

## 2011-05-18 LAB — LIPID PANEL
HDL: 33.9 mg/dL — ABNORMAL LOW (ref >39.00)
LDL Cholesterol: 90 mg/dL (ref 0–99)
VLDL: 34.8 mg/dL (ref 0.0–40.0)

## 2011-05-18 LAB — URIC ACID: Uric Acid, Serum: 8.1 mg/dL — ABNORMAL HIGH (ref 4.0–7.8)

## 2011-05-18 LAB — HEPATIC FUNCTION PANEL
ALT: 16 U/L (ref 0–53)
Total Bilirubin: 0.5 mg/dL (ref 0.3–1.2)

## 2011-05-18 LAB — BASIC METABOLIC PANEL
Calcium: 9.9 mg/dL (ref 8.4–10.5)
Creatinine, Ser: 1.5 mg/dL (ref 0.4–1.5)
GFR: 50.26 mL/min — ABNORMAL LOW (ref >60.00)
Sodium: 143 mEq/L (ref 135–145)

## 2011-05-18 LAB — CK: Total CK: 102 U/L (ref 7–232)

## 2011-05-18 NOTE — Progress Notes (Signed)
1 

## 2011-05-25 ENCOUNTER — Encounter: Payer: Self-pay | Admitting: Internal Medicine

## 2011-05-25 ENCOUNTER — Ambulatory Visit (INDEPENDENT_AMBULATORY_CARE_PROVIDER_SITE_OTHER): Payer: Federal, State, Local not specified - PPO | Admitting: Internal Medicine

## 2011-05-25 DIAGNOSIS — M109 Gout, unspecified: Secondary | ICD-10-CM

## 2011-05-25 DIAGNOSIS — I10 Essential (primary) hypertension: Secondary | ICD-10-CM

## 2011-05-25 DIAGNOSIS — R7309 Other abnormal glucose: Secondary | ICD-10-CM

## 2011-05-25 DIAGNOSIS — E782 Mixed hyperlipidemia: Secondary | ICD-10-CM

## 2011-05-25 NOTE — Patient Instructions (Signed)
To prevent gout the minimal uric acid goal is < 7; preferred is < 6, ideally < 5 to prevent gout.  The most common cause of elevated uric acid is the ingestion of sugar from high fructose corn syrup sources. You should consume less than 40 grams (PREFERABLY NONE)  of sugar per day from foods and drinks with high fructose corn syrup as number1, 2, 3, or #4 on the label. Eat a low-fat diet with lots of fruits and vegetables, up to 7-9 servings per day.  Follow a  low carb nutrition program such as [place] or[employer] as closely as possible to prevent Diabetes . White carbohydrates (potatoes, rice, bread, and pasta) have a high spike of sugar and a high load of sugar. For example a  baked potato has a cup of sugar and a  french fry  2 teaspoons of sugar. Yams, wild  rice, whole grained bread &  wheat pasta have been much lower spike and load of  sugar. Portions should be the size of a deck of cards or your palm.   BUN, creatinine, and GFR  all assess kidney function. To protect the kidneys it  is important to control your blood pressure and sugar. You should also stay well hydrated. Drink to thirst, up to 40 ounces of water a day.   Please  schedule fasting Labs in 6 months : BMET,Lipids, hepatic panel, uric acid , TSH, A1c. PLEASE BRING THESE INSTRUCTIONS TO FOLLOW UP  LAB APPOINTMENT.This will guarantee correct labs are drawn, eliminating need for repeat blood sampling ( needle sticks ! ). Diagnoses /Codes: 272.4, 995.20, 790.29, [id number]

## 2011-05-25 NOTE — Progress Notes (Signed)
  Subjective:    Patient ID: Juan [name], male    DOB: 1936/04/17, 75 y.o.   MRN: [id number]  HPI Juan [name] returns to review his labs. He states he has not had any gout attacks. He maintains his wife reads labels and they're avoiding the consumption of high fructose corn syrup sugar.  His triglycerides have dropped slightly from 184-174. Uric acid has also improved slightly from 8.3-8.1. Goals were discussed.  His LDL is at goal at 90. Liver functions are normal except for a mildly reduced alkaline phosphatase. He is on both try Trilipix  and pravastatin.  Review of his medicines reveals no agent which should be raising the uric acid such as thiazide diuretic.  BUN is 34 and GFR is 50.26. Creatinine is high normal at 1.5. He states that his fluid intake   is good.    Review of Systems     Objective:   Physical Exam he appears healthy and well-nourished  Breast sounds are decreased but there is no increased work of breathing. He has no rales, rhonchi or wheezes.  An S4 is present without significant murmurs or gallops  All pulses are intact without deficit or bruit.  Trace edema is noted at the sock line.         Assessment & Plan:   #1 there has been improvement in his triglycerides and uric acid; avoidance of sugar from high fructose corn syrup sources is essential. This should be eliminated totally if @ all possible.  #2 mild renal insufficiency; hydration is critical.  Plan: See orders and recommendations.

## 2011-07-02 ENCOUNTER — Other Ambulatory Visit: Payer: Self-pay | Admitting: Internal Medicine

## 2011-07-09 ENCOUNTER — Other Ambulatory Visit: Payer: Self-pay | Admitting: Cardiology

## 2011-07-09 MED ORDER — METOPROLOL TARTRATE 50 MG PO TABS: 50.0000 mg | ORAL_TABLET | Freq: Two times a day (BID) | ORAL | Status: DC

## 2011-09-24 ENCOUNTER — Ambulatory Visit: Payer: Federal, State, Local not specified - PPO | Admitting: Cardiovascular Disease

## 2011-10-29 ENCOUNTER — Ambulatory Visit (INDEPENDENT_AMBULATORY_CARE_PROVIDER_SITE_OTHER): Payer: Federal, State, Local not specified - PPO | Admitting: Family Medicine

## 2011-10-29 ENCOUNTER — Encounter: Payer: Self-pay | Admitting: Family Medicine

## 2011-10-29 VITALS — BP 125/85 | HR 67 | Temp 98.0°F | Ht 70.0 in | Wt 194.0 lb

## 2011-10-29 DIAGNOSIS — R05 Cough: Secondary | ICD-10-CM

## 2011-10-29 MED ORDER — BENZONATATE 200 MG PO CAPS: 200.0000 mg | ORAL_CAPSULE | Freq: Three times a day (TID) | ORAL | Status: AC | PRN

## 2011-10-29 MED ORDER — AZITHROMYCIN 250 MG PO TABS: ORAL_TABLET | ORAL | Status: AC

## 2011-10-29 NOTE — Assessment & Plan Note (Signed)
New.  No evidence of bacterial infxn on PE but given wife's recent stem cell transplant and immunosuppression will start zpack.  Reviewed supportive care and red flags that should prompt return.  Pt expressed understanding and is in agreement w/ plan.

## 2011-10-29 NOTE — Patient Instructions (Signed)
This doesn't appear to be an infection but we'll treat it w/ antibiotics anyway to protect your wife Start the Zpack as directed Drink plenty of fluids Use the cough pills as needed REST! Hang in there!!!

## 2011-10-29 NOTE — Progress Notes (Signed)
  Subjective:    Patient ID: Juan [name], male    DOB: 19-Oct-1935, 76 y.o.   MRN: [id number]  HPI Cough- started May 1st.  Wife recently had stem cell transplant and has no immunity.  No fevers.  No nasal congestion.  No HA.  No PND.  Cough is not productive.  Feels well.  Cough worsens w/ activity.  Taking OTC cough meds w/out relief.   Review of Systems For ROS see HPI     Objective:   Physical Exam  Vitals reviewed. Constitutional: He appears well-developed and well-nourished. No distress.  HENT:  Head: Normocephalic and atraumatic.  Right Ear: Tympanic membrane normal.  Left Ear: Tympanic membrane normal.  Nose: No mucosal edema or rhinorrhea. Right sinus exhibits no maxillary sinus tenderness and no frontal sinus tenderness. Left sinus exhibits no maxillary sinus tenderness and no frontal sinus tenderness.  Mouth/Throat: Mucous membranes are normal. No oropharyngeal exudate, posterior oropharyngeal edema or posterior oropharyngeal erythema.  Eyes: Conjunctivae and EOM are normal. Pupils are equal, round, and reactive to light.  Neck: Normal range of motion. Neck supple.  Cardiovascular: Normal rate, regular rhythm and normal heart sounds.   Pulmonary/Chest: Effort normal and breath sounds normal. No respiratory distress. He has no wheezes.       No cough heard  Lymphadenopathy:    He has no cervical adenopathy.  Skin: Skin is warm and dry.          Assessment & Plan:

## 2011-10-31 ENCOUNTER — Ambulatory Visit: Payer: Federal, State, Local not specified - PPO | Admitting: Family Medicine

## 2011-11-22 ENCOUNTER — Other Ambulatory Visit: Payer: Self-pay | Admitting: Internal Medicine

## 2011-11-22 DIAGNOSIS — R7309 Other abnormal glucose: Secondary | ICD-10-CM

## 2011-11-22 DIAGNOSIS — T887XXA Unspecified adverse effect of drug or medicament, initial encounter: Secondary | ICD-10-CM

## 2011-11-22 DIAGNOSIS — E785 Hyperlipidemia, unspecified: Secondary | ICD-10-CM

## 2011-11-22 DIAGNOSIS — M109 Gout, unspecified: Secondary | ICD-10-CM

## 2011-11-23 ENCOUNTER — Other Ambulatory Visit (INDEPENDENT_AMBULATORY_CARE_PROVIDER_SITE_OTHER): Payer: Federal, State, Local not specified - PPO

## 2011-11-23 DIAGNOSIS — T887XXA Unspecified adverse effect of drug or medicament, initial encounter: Secondary | ICD-10-CM

## 2011-11-23 DIAGNOSIS — M109 Gout, unspecified: Secondary | ICD-10-CM

## 2011-11-23 DIAGNOSIS — E785 Hyperlipidemia, unspecified: Secondary | ICD-10-CM

## 2011-11-23 DIAGNOSIS — R7309 Other abnormal glucose: Secondary | ICD-10-CM

## 2011-11-23 LAB — HEPATIC FUNCTION PANEL
ALT: 19 U/L (ref 0–53)
AST: 24 U/L (ref 0–37)
Alkaline Phosphatase: 35 U/L — ABNORMAL LOW (ref 39–117)
Bilirubin, Direct: 0.1 mg/dL (ref 0.0–0.3)
Total Bilirubin: 0.6 mg/dL (ref 0.3–1.2)
Total Protein: 6.6 g/dL (ref 6.0–8.3)

## 2011-11-23 LAB — BASIC METABOLIC PANEL
BUN: 26 mg/dL — ABNORMAL HIGH (ref 6–23)
Calcium: 9.9 mg/dL (ref 8.4–10.5)
Creatinine, Ser: 1.3 mg/dL (ref 0.4–1.5)
GFR: 57.9 mL/min — ABNORMAL LOW (ref >60.00)
Glucose, Bld: 117 mg/dL — ABNORMAL HIGH (ref 70–99)
Potassium: 3.9 mEq/L (ref 3.5–5.1)

## 2011-11-23 LAB — LIPID PANEL: Cholesterol: 170 mg/dL (ref 0–200)

## 2011-11-30 ENCOUNTER — Encounter: Payer: Self-pay | Admitting: Internal Medicine

## 2011-11-30 ENCOUNTER — Ambulatory Visit (INDEPENDENT_AMBULATORY_CARE_PROVIDER_SITE_OTHER): Payer: Federal, State, Local not specified - PPO | Admitting: Internal Medicine

## 2011-11-30 VITALS — BP 128/68 | HR 67 | Temp 98.1°F | Resp 16 | Ht 70.0 in | Wt 191.0 lb

## 2011-11-30 DIAGNOSIS — E119 Type 2 diabetes mellitus without complications: Secondary | ICD-10-CM

## 2011-11-30 DIAGNOSIS — E782 Mixed hyperlipidemia: Secondary | ICD-10-CM

## 2011-11-30 DIAGNOSIS — N259 Disorder resulting from impaired renal tubular function, unspecified: Secondary | ICD-10-CM

## 2011-11-30 DIAGNOSIS — M109 Gout, unspecified: Secondary | ICD-10-CM

## 2011-11-30 MED ORDER — FEBUXOSTAT 40 MG PO TABS: ORAL_TABLET | ORAL | Status: DC

## 2011-11-30 NOTE — Assessment & Plan Note (Signed)
Lipids are improved; triglycerides are still minimally elevated at 162. This is on dual therapy. No change indicated

## 2011-11-30 NOTE — Patient Instructions (Addendum)
Please use the Uloric 80 mg samples at one half pill daily. Recheck uric acid and kidney function (BMET) in 7 weeks Do NOT fill Rx for 40 mg until we re-evaluate labs.Marland KitchenPLEASE BRING THESE INSTRUCTIONS TO FOLLOW UP  LAB APPOINTMENT.This will guarantee correct labs are drawn, eliminating need for repeat blood sampling ( needle sticks ! ). Diagnoses /Codes: gout, renal insufficiency  The most common cause of elevated triglycerides AND uric acid  is the ingestion of sugar from high fructose corn syrup sources added to processed foods & drinks.  Eat a low-fat diet with lots of fruits and vegetables, up to 7-9 servings per day. Consume less than 40 (preferably ZERO) grams of sugar per day from foods & drinks with High Fructose Corn Syrup (HFCS) sugar as #1,2,3 or # 4 on label.Whole Foods, [employer] & [employer] do not carry products with HFCS. Follow a  low carb nutrition program such as [place] or [employer] Sugar Busters  to prevent Diabetes progression . White carbohydrates (potatoes, rice, bread, and pasta) have a high spike of sugar and a high load of sugar. For example a  baked potato has a cup of sugar and a  french fry  2 teaspoons of sugar. Yams, wild  rice, whole grained bread &  wheat pasta have been much lower spike and load of  sugar. Portions should be the size of a deck of cards or your palm.

## 2011-11-30 NOTE — Progress Notes (Signed)
  Subjective:    Patient ID: Juan [name], male    DOB: [birth year], 76 y.o.   MRN: [id number]  HPI HYPERTENSION: Disease Monitoring: Blood pressure range-not monitored @ home  Chest pain, palpitations-no       Dyspnea- no Medications: Compliance- yes  Lightheadedness,Syncope- no    Edema- no  DIABETES: Disease Monitoring: Blood Sugar ranges-not monitored  Polyuria/phagia/dipsia- no       Visual problems- no; last Ophth exam 2012: no retinopathy Medications: Compliance- no meds, metformin is not option because of his renal insufficiency. He is on a modified Sugar Busters dietary program   HYPERLIPIDEMIA: Disease Monitoring: See symptoms for Hypertension Medications: Compliance- yes,dual therapy Abd pain, bowel changes-no   Muscle aches- no        Review of Systems He unfortunately lost his wife recently following her second stem cell transplant and protracted illness.He denies significant anxiety or depression; he has a supportive church family.  He did have a gout attack last week; uric acid was 9.2 on 11/23/2011. He is not on allopurinol because of prior renal insufficiency.     Objective:   Physical Exam Gen.: Healthy and well-nourished in appearance. Alert, appropriate and cooperative throughout exam.  Eyes: No corneal or conjunctival inflammation noted.  Neck: No deformities, masses, or tenderness noted.  Thyroid normal Lungs: Normal respiratory effort; chest expands symmetrically. Lungs are clear to auscultation without rales, wheezes, or increased work of breathing. Heart: Normal rate and rhythm. Normal S1 and S2. No gallop, click, or rub. S4 with slurring ; faint G 1/2 systolic  murmur. Abdomen: Bowel sounds normal; abdomen soft and nontender. No masses, organomegaly or hernias noted.                                                                                 Musculoskeletal/extremities: No deformity or scoliosis noted of  the thoracic or lumbar spine; bu  tthere is some asymmetry of the posterior thoracic musculature suggesting occult scoliosis. No clubbing, cyanosis, edema, or deformity noted. Joints normal. Some mild toenail changes. Vascular: Carotid, radial artery, dorsalis pedis and  posterior tibial pulses are full and equal. No bruits present. Neurologic: Alert and oriented x3. Deep tendon reflexes symmetrical and normal. Light touch normal over feet.          Skin: Intact without suspicious lesions or rashes. Lymph: No cervical, axillary lymphadenopathy present. Psych: Mood and affect are normal. Normally interactive                                                                                         Assessment & Plan:

## 2011-11-30 NOTE — Assessment & Plan Note (Signed)
He had a recent gout attack relieved with one dose of medication from an orthopedist. He is unsure of the name of the medicine. He is at high risk for recurrent gout because the uric acid 9.2. Allopurinol is not an option because of the renal issues. Dietary intervention and low-dose Uloric will be considered.

## 2012-01-09 ENCOUNTER — Other Ambulatory Visit: Payer: Self-pay | Admitting: Internal Medicine

## 2012-01-18 ENCOUNTER — Other Ambulatory Visit (INDEPENDENT_AMBULATORY_CARE_PROVIDER_SITE_OTHER): Payer: Federal, State, Local not specified - PPO

## 2012-01-18 ENCOUNTER — Other Ambulatory Visit: Payer: Self-pay | Admitting: Cardiology

## 2012-01-18 DIAGNOSIS — N289 Disorder of kidney and ureter, unspecified: Secondary | ICD-10-CM

## 2012-01-18 DIAGNOSIS — M109 Gout, unspecified: Secondary | ICD-10-CM

## 2012-01-18 LAB — BASIC METABOLIC PANEL
CO2: 27 mEq/L (ref 19–32)
Calcium: 9.6 mg/dL (ref 8.4–10.5)
Creatinine, Ser: 1.3 mg/dL (ref 0.4–1.5)
Glucose, Bld: 125 mg/dL — ABNORMAL HIGH (ref 70–99)
Sodium: 142 mEq/L (ref 135–145)

## 2012-01-18 NOTE — Progress Notes (Signed)
Labs only

## 2012-02-02 ENCOUNTER — Observation Stay (HOSPITAL_COMMUNITY)
Admission: EM | Admit: 2012-02-02 | Discharge: 2012-02-03 | Disposition: A | Discharge status: Home / Self Care | Payer: Federal, State, Local not specified - PPO | Attending: Emergency Medicine | Admitting: Emergency Medicine

## 2012-02-02 ENCOUNTER — Emergency Department (HOSPITAL_COMMUNITY): Payer: Federal, State, Local not specified - PPO

## 2012-02-02 ENCOUNTER — Encounter (HOSPITAL_COMMUNITY): Payer: Self-pay

## 2012-02-02 DIAGNOSIS — K219 Gastro-esophageal reflux disease without esophagitis: Secondary | ICD-10-CM | POA: Insufficient documentation

## 2012-02-02 DIAGNOSIS — R4789 Other speech disturbances: Secondary | ICD-10-CM | POA: Insufficient documentation

## 2012-02-02 DIAGNOSIS — I10 Essential (primary) hypertension: Secondary | ICD-10-CM | POA: Insufficient documentation

## 2012-02-02 DIAGNOSIS — R269 Unspecified abnormalities of gait and mobility: Secondary | ICD-10-CM

## 2012-02-02 DIAGNOSIS — R739 Hyperglycemia, unspecified: Secondary | ICD-10-CM

## 2012-02-02 DIAGNOSIS — E781 Pure hyperglyceridemia: Secondary | ICD-10-CM

## 2012-02-02 DIAGNOSIS — N289 Disorder of kidney and ureter, unspecified: Secondary | ICD-10-CM

## 2012-02-02 DIAGNOSIS — E785 Hyperlipidemia, unspecified: Secondary | ICD-10-CM | POA: Insufficient documentation

## 2012-02-02 DIAGNOSIS — R279 Unspecified lack of coordination: Principal | ICD-10-CM | POA: Insufficient documentation

## 2012-02-02 DIAGNOSIS — I2510 Atherosclerotic heart disease of native coronary artery without angina pectoris: Secondary | ICD-10-CM | POA: Insufficient documentation

## 2012-02-02 DIAGNOSIS — G459 Transient cerebral ischemic attack, unspecified: Secondary | ICD-10-CM

## 2012-02-02 DIAGNOSIS — E786 Lipoprotein deficiency: Secondary | ICD-10-CM

## 2012-02-02 HISTORY — DX: Hypercalcemia: E83.52

## 2012-02-02 LAB — CBC WITH DIFFERENTIAL/PLATELET
Basophils Absolute: 0.1 10*3/uL (ref 0.0–0.1)
Eosinophils Absolute: 0.2 10*3/uL (ref 0.0–0.7)
HCT: 38.7 % — ABNORMAL LOW (ref 39.0–52.0)
Lymphs Abs: 1.5 10*3/uL (ref 0.7–4.0)
MCH: 30.4 pg (ref 26.0–34.0)
MCHC: 34.6 g/dL (ref 30.0–36.0)
MCV: 87.8 fL (ref 78.0–100.0)
Neutro Abs: 2.6 10*3/uL (ref 1.7–7.7)
RDW: 13.8 % (ref 11.5–15.5)

## 2012-02-02 LAB — POCT I-STAT, CHEM 8
Calcium, Ion: 1.37 mmol/L — ABNORMAL HIGH (ref 1.13–1.30)
Chloride: 108 mEq/L (ref 96–112)
Glucose, Bld: 130 mg/dL — ABNORMAL HIGH (ref 70–99)
HCT: 39 % (ref 39.0–52.0)

## 2012-02-02 LAB — RAPID URINE DRUG SCREEN, HOSP PERFORMED
Amphetamines: NOT DETECTED
Benzodiazepines: NOT DETECTED
Opiates: NOT DETECTED

## 2012-02-02 LAB — URINALYSIS, ROUTINE W REFLEX MICROSCOPIC
Bilirubin Urine: NEGATIVE
Glucose, UA: NEGATIVE mg/dL
Hgb urine dipstick: NEGATIVE
Ketones, ur: NEGATIVE mg/dL
Leukocytes, UA: NEGATIVE
Nitrite: NEGATIVE
Protein, ur: NEGATIVE mg/dL
Specific Gravity, Urine: 1.017 (ref 1.005–1.030)
Urobilinogen, UA: 0.2 mg/dL (ref 0.0–1.0)
pH: 5 (ref 5.0–8.0)

## 2012-02-02 LAB — COMPREHENSIVE METABOLIC PANEL
Albumin: 4 g/dL (ref 3.5–5.2)
BUN: 28 mg/dL — ABNORMAL HIGH (ref 6–23)
Creatinine, Ser: 1.41 mg/dL — ABNORMAL HIGH (ref 0.50–1.35)
GFR calc Af Amer: 55 mL/min — ABNORMAL LOW (ref >90)
Glucose, Bld: 131 mg/dL — ABNORMAL HIGH (ref 70–99)
Total Bilirubin: 0.4 mg/dL (ref 0.3–1.2)
Total Protein: 7.3 g/dL (ref 6.0–8.3)

## 2012-02-02 LAB — CARDIAC PANEL(CRET KIN+CKTOT+MB+TROPI)
CK, MB: 7.7 ng/mL (ref 0.3–4.0)
Relative Index: 3.2 — ABNORMAL HIGH (ref 0.0–2.5)
Total CK: 242 U/L — ABNORMAL HIGH (ref 7–232)
Troponin I: <0.3 ng/mL

## 2012-02-02 LAB — PROTIME-INR
INR: 1.02 (ref 0.00–1.49)
Prothrombin Time: 13.6 s (ref 11.6–15.2)

## 2012-02-02 LAB — POCT I-STAT TROPONIN I

## 2012-02-02 LAB — APTT: aPTT: 25 seconds (ref 24–37)

## 2012-02-02 LAB — ETHANOL: Alcohol, Ethyl (B): <11 mg/dL (ref 0–11)

## 2012-02-02 MED ORDER — ACETAMINOPHEN 325 MG PO TABS: 650.0000 mg | ORAL_TABLET | ORAL | Status: DC | PRN

## 2012-02-02 NOTE — Code Documentation (Addendum)
Code stroke called at 1358, patient arrived to [facility] via EMS at 1421, EDP seen at 41, stroke team at 1413, CT scan at 73, lab arrived at 56.  As per patient and grandson at bedside, LSN at 1330, were at golf tournament and patient became dizzy with trouble walking, grandson also noticed some increased stuttering of words  NIHSS 0 cancelled at 1451

## 2012-02-02 NOTE — ED Notes (Signed)
Pt was at golf tournament with grandson and had sudden onset of unsteady gait and stuttering with speech.  Per EMS, upon their arrival pt refused transport to hospital.  EMS spent 1 hour attempting to get pt to agree to transport.  Upon arrival pt brought in via wheelchair due to refusal to get on stretcher.  Pt A & O x 4.  No neuro deficits noted.

## 2012-02-02 NOTE — ED Provider Notes (Signed)
Patient placed in CDU by Dr. Freida Busman.   Patient most likely had TIA earlier today on the golf course. CT of the head was done which showed no gross intracranial bleeding. Due to Cochlear implant the patient can not do an MRI. Dr. Freida Busman recommended patient be placed on TIA protocol and placed in the CDU for carotid dopplers and echo of the heart. The patient originally refused to stay. However, after him and his daughters talked it over, he has agreed to stay over night.  Basic lab work negative, pt has been cleared by neurology. Will ordered TIA protocol work-up minus MRI.  Dorthula Matas, PA 02/02/12 1741  At 10:50 PM - the patients relative index on the cardiac panel is elevated at 3.2. The Troponin is negative. The patient never had chest pain and does not currently have chest pain. After reviewing results with Dr. Radford Pax, we have decided that this result has no clinical significant at this time.  Sign out for patient to oncoming night time doc.   Dorthula Matas, PA 02/02/12 2252

## 2012-02-02 NOTE — ED Notes (Addendum)
Pt being transported to CDU 6.  Report given to [facility].

## 2012-02-02 NOTE — Consult Note (Signed)
Referring Physician:   Dr. Lorre Nick- ED Chief Complaint: Stuttering and gait issues Charts, medications,  Labs and images reviewed Obtained history directly from the patient's grandson who was the eye witness of the incident  HPI:   This is a 76 y/o RHWM with known history of DDD, gout, osteoarthritis, stutters since childhood and stroke risk factors who went to a watch a golf tournament and he was walking for a while, and then his 56 y/o grandson- Romeo Apple noticed that the patient's stuttering got worse and his gait was abnormal and he c/o of leg pain, additionally his symptoms resolved after he sat down.  There was no loc, no chest pain, no sob, no abdominal pain, no nausea, no vomiting, no confusion, no disorientation, no seizure like activity, no  Fall,  No light headedness, no dizziness, no  Other significant complaints. Agrees to being non compliant to medications.   LSN: 1:00 pm tPA Given: NO 1) less likely to be a stroke 2) NIH =0 3) Could be gout or lumbar stenosis, symptoms got better with sitting down  Past Medical History  Diagnosis Date  . Hyperlipidemia   . Coronary artery disease   . Duodenitis     PMH of  . Fasting hyperglycemia   . Gout   . Diverticular disease   . Elevated PSA   . Tubular adenoma polyp of rectum   . Pancreatitis     ? Simvastatin related  . GERD (gastroesophageal reflux disease)   . Hydronephrosis with ureteropelvic junction obstruction   . Hypertension     Past Surgical History  Procedure Date  . Cochlear implant   . Coronary artery bypass graft 2000    x 5  . Appendectomy   . Cholecystectomy   . Tonsillectomy   . Colonoscopy w/ polypectomy     no polyp 2012  . Parathyroidectomy 1978  . Polypectomy     Family History  Problem Relation Age of Onset  . Melanoma Father   . Heart attack Father      in 90s  . Diabetes Neg Hx    Social History:  reports that he has quit smoking. His smoking use included Cigars. He has never used  smokeless tobacco. He reports that he does not drink alcohol or use illicit drugs. His wife died recently, he has been living alone since then.   Allergies:  Allergies  Allergen Reactions  . Simvastatin     REACTION:  Possible statin induced pancreatitis    Medications:   Reviewed  ROS:   General : negative for - chills, fatigue, fever, night sweats, weight gain or weight loss Psychological  negative for - behavioral disorder, hallucinations, memory difficulties, mood swings or suicidal ideation Ophthalmic:  negative for - blurry vision, double vision, eye pain or loss of vision ENT negative for  tinnitus or vertigo Hematological :  negative for - bleeding  disorder Respiratory  negative for -  shortness of breath  Cardiovascular : negative for - chest pain, dyspnea on exertion, Gastrointestinal: negative for - abdominal pain, diarrhea, hematemesis, nausea/vomiting or stool incontinence Genito-Urinary : negative for - dysuria, hematuria, incontinence or urinary frequency/urgency Musculoskeletal: Gout and osteoarthritis Neurological : as noted in HPI Dermatological : negative for rash and skin lesion changes  Physical Examination: Blood pressure 157/79, temperature 98.2 F (36.8 C), temperature source Oral, resp. rate 16, SpO2 98.00%.  Neurologic Examination:  NIH =0 Mental Status: Alert, oriented, thought content appropriate.  Speech fluent without evidence of aphasia mild  lisping .  Able to follow 3 step commands without difficulty. Cranial Nerves: II: visual fields grossly normal, pupils equal, round, reactive to light and accommodation III,IV, VI: ptosis not present, extra-ocular motions intact bilaterally V,VII: smile symmetric, facial light touch sensation normal bilaterally VIII: hearing deficits ( since age 67) IX,X: gag reflex present XI: trapezius strength/neck flexion strength normal bilaterally XII: tongue strength normal  Motor: Right : Upper extremity    5/5    Left:     Upper extremity   5/5  Lower extremity   5/5     Lower extremity   5/5 Tone and bulk:normal tone throughout; no atrophy noted Sensory: Pinprick and light touch intact throughout, bilaterally, vibration intact Deep Tendon Reflexes: 2+ and symmetric throughout  Plantars: Right: downgoing   Left: downgoing Cerebellar: normal finger-to-nose, normal rapid alternating movements and normal heel-to-shin test normal gait and station   Laboratory Studies:  Basic Metabolic Panel:  Lab 02/02/12 1610  NA 142  K 3.6  CL 108  CO2 --  GLUCOSE 130*  BUN 28*  CREATININE 1.50*  CALCIUM --  MG --  PHOS --    Liver Function Tests: No results found for this basename: AST:5,ALT:5,ALKPHOS:5,BILITOT:5,PROT:5,ALBUMIN:5 in the last 168 hours No results found for this basename: LIPASE:5,AMYLASE:5 in the last 168 hours No results found for this basename: AMMONIA:3 in the last 168 hours  CBC:  Lab 02/02/12 1438  WBC --  NEUTROABS --  HGB 13.3  HCT 39.0  MCV --  PLT --    Cardiac Enzymes: No results found for this basename: CKTOTAL:5,CKMB:5,CKMBINDEX:5,TROPONINI:5 in the last 168 hours  BNP: No components found with this basename: POCBNP:5  CBG: No results found for this basename: GLUCAP:5 in the last 168 hours  Microbiology: No results found for this or any previous visit.  Coagulation Studies: No results found for this basename: LABPROT:5,INR:5 in the last 72 hours  Urinalysis: No results found for this basename: COLORURINE:2,APPERANCEUR:2,LABSPEC:2,PHURINE:2,GLUCOSEU:2,HGBUR:2,BILIRUBINUR:2,KETONESUR:2,PROTEINUR:2,UROBILINOGEN:2,NITRITE:2,LEUKOCYTESUR:2 in the last 168 hours  Lipid Panel:    Component Value Date/Time   CHOL 170 11/23/2011 0806   TRIG 162.0* 11/23/2011 0806   HDL 41.20 11/23/2011 0806   CHOLHDL 4 11/23/2011 0806   VLDL 32.4 11/23/2011 0806   LDLCALC 96 11/23/2011 0806    HgbA1C:  Lab Results  Component Value Date   HGBA1C 6.5 11/23/2011    Urine  Drug Screen:   No results found for this basename: labopia, cocainscrnur, labbenz, amphetmu, thcu, labbarb    Alcohol Level: No results found for this basename: ETH:2 in the last 168 hours  Other results: EKG: normal EKG, normal sinus rhythm, unchanged from previous tracings.  Imaging: Ct Head Wo Contrast  02/02/2012  *RADIOLOGY REPORT*  Clinical Data: 76 year old male with unsteady gait, slurred speech, hypertensive.  Cochlear implant.  CT HEAD WITHOUT CONTRAST  Technique:  Contiguous axial images were obtained from the base of the skull through the vertex without contrast.  Comparison: None.  Findings: Sequelae of right cochlear implant. Visualized paranasal sinuses and mastoids are clear.  No acute osseous abnormality identified.  No acute orbit or scalp soft tissue findings.  Moderate Calcified atherosclerosis at the skull base.  Streak artifact from the cochlear implant.  No ventriculomegaly. No midline shift, mass effect, or evidence of mass lesion.  There is some disproportionate volume loss of the cerebellum. No acute intracranial hemorrhage identified.  Patchy bilateral cerebral white matter hypodensity. No evidence of cortically based acute infarction identified.  No suspicious intracranial vascular hyperdensity.  IMPRESSION: 1.  No acute intracranial abnormality. 2.  Mild for age nonspecific white matter changes. 3.  Sequelae of right cochlear implant.  Critical Value/emergent results were called by telephone at the time of interpretation on 02/02/2012 at 1435 hours to Dr. Lorre Nick, who verbally acknowledged these results.  Original Report Authenticated By: Harley Hallmark, M.D.    Assessment:    76 y/o with stroke risk factors but also has other medical issues such as gout, OA, DDD.  He also has lisping and speech abnormalities since childhood. Had an episode of weakness in both the legs  and worsening of stuttering and symptoms resolved after he sat down.   Differential  diagnosis: 1) Lumbar stenosis 2) Gouty Arthritis/OA 3) Anxiety 4) TIA- less likely  Plan:  1) Do MRI of the brain. If the MRI does not indicate stroke, no reason to do the stroke work up 2) C/W ASA 3) Would not treat his BP unless MRI is done and is negative for stroke Robert Sunga V-P Eilleen Kempf., MD., Ph.D.,MS 02/02/2012 3:41 PM

## 2012-02-02 NOTE — ED Provider Notes (Signed)
History     CSN: [id number]  Arrival date & time 02/02/12  1421   First MD Initiated Contact with Patient 02/02/12 1454      Chief Complaint  Patient presents with  . Code Stroke    (Consider location/radiation/quality/duration/timing/severity/associated sxs/prior treatment) The history is provided by the patient and a relative.   patient here after having a sudden onset of ataxia and trouble speaking. Symptoms lasted for approximately 10 minutes and improved once he was able to down. He had no associated headache, vertiginous symptoms, chest pain, shortness of breath, diaphoresis, abdominal pain. No prior history of same. He feels back to his baseline. EMS was called and patient was classified as a code stroke  Past Medical History  Diagnosis Date  . Hyperlipidemia   . Coronary artery disease   . Duodenitis     PMH of  . Fasting hyperglycemia   . Gout   . Diverticular disease   . Elevated PSA   . Tubular adenoma polyp of rectum   . Pancreatitis     ? Simvastatin related  . GERD (gastroesophageal reflux disease)   . Hydronephrosis with ureteropelvic junction obstruction   . Hypertension     Past Surgical History  Procedure Date  . Cochlear implant   . Coronary artery bypass graft 2000    x 5  . Appendectomy   . Cholecystectomy   . Tonsillectomy   . Colonoscopy w/ polypectomy     no polyp 2012  . Parathyroidectomy 1978  . Polypectomy     Family History  Problem Relation Age of Onset  . Melanoma Father   . Heart attack Father      in 90s  . Diabetes Neg Hx     History  Substance Use Topics  . Smoking status: Former Smoker -- 1 years    Types: Cigars  . Smokeless tobacco: Never Used   Comment: rare cigar  . Alcohol Use: No      Review of Systems  All other systems reviewed and are negative.    Allergies  Simvastatin  Home Medications   Current Outpatient Rx  Name Route Sig Dispense Refill  . ASPIRIN 81 MG PO TABS Oral Take 81 mg by mouth  daily.      . CHOLINE FENOFIBRATE 135 MG PO CPDR Oral Take 135 mg by mouth daily.    . DUTASTERIDE 0.5 MG PO CAPS Oral Take 0.5 mg by mouth daily.      Marland Kitchen METOPROLOL TARTRATE 50 MG PO TABS Oral Take 50 mg by mouth 2 (two) times daily.    Marland Kitchen PRAVASTATIN SODIUM 20 MG PO TABS Oral Take 1 tablet (20 mg total) by mouth daily. 30 tablet 12    BP 157/79  Temp 98.2 F (36.8 C) (Oral)  Resp 16  SpO2 98%  Physical Exam  Nursing note and vitals reviewed. Constitutional: He is oriented to person, place, and time. He appears well-developed and well-nourished.  Non-toxic appearance. No distress.  HENT:  Head: Normocephalic and atraumatic.  Eyes: Conjunctivae, EOM and lids are normal. Pupils are equal, round, and reactive to light.  Neck: Normal range of motion. Neck supple. No tracheal deviation present. No mass present.  Cardiovascular: Normal rate, regular rhythm and normal heart sounds.  Exam reveals no gallop.   No murmur heard. Pulmonary/Chest: Effort normal and breath sounds normal. No stridor. No respiratory distress. He has no decreased breath sounds. He has no wheezes. He has no rhonchi. He has no rales.  Abdominal: Soft. Normal appearance and bowel sounds are normal. He exhibits no distension. There is no tenderness. There is no rebound and no CVA tenderness.  Musculoskeletal: Normal range of motion. He exhibits no edema and no tenderness.  Neurological: He is alert and oriented to person, place, and time. He has normal strength. No cranial nerve deficit or sensory deficit. GCS eye subscore is 4. GCS verbal subscore is 5. GCS motor subscore is 6.  Skin: Skin is warm and dry. No abrasion and no rash noted.  Psychiatric: He has a normal mood and affect. His speech is normal and behavior is normal.    ED Course  Procedures (including critical care time)  Labs Reviewed  POCT I-STAT, CHEM 8 - Abnormal; Notable for the following:    BUN 28 (*)     Creatinine, Ser 1.50 (*)     Glucose, Bld  130 (*)     Calcium, Ion 1.37 (*)     All other components within normal limits  CBC WITH DIFFERENTIAL  COMPREHENSIVE METABOLIC PANEL  ETHANOL  URINE RAPID DRUG SCREEN (HOSP PERFORMED)   Ct Head Wo Contrast  02/02/2012  *RADIOLOGY REPORT*  Clinical Data: 76 year old male with unsteady gait, slurred speech, hypertensive.  Cochlear implant.  CT HEAD WITHOUT CONTRAST  Technique:  Contiguous axial images were obtained from the base of the skull through the vertex without contrast.  Comparison: None.  Findings: Sequelae of right cochlear implant. Visualized paranasal sinuses and mastoids are clear.  No acute osseous abnormality identified.  No acute orbit or scalp soft tissue findings.  Moderate Calcified atherosclerosis at the skull base.  Streak artifact from the cochlear implant.  No ventriculomegaly. No midline shift, mass effect, or evidence of mass lesion.  There is some disproportionate volume loss of the cerebellum. No acute intracranial hemorrhage identified.  Patchy bilateral cerebral white matter hypodensity. No evidence of cortically based acute infarction identified.  No suspicious intracranial vascular hyperdensity.  IMPRESSION: 1. No acute intracranial abnormality. 2.  Mild for age nonspecific white matter changes. 3.  Sequelae of right cochlear implant.  Critical Value/emergent results were called by telephone at the time of interpretation on 02/02/2012 at 1435 hours to Dr. Lorre Nick, who verbally acknowledged these results.  Original Report Authenticated By: Harley Hallmark, M.D.     No diagnosis found.    MDM  Patient was initially a code stroke and which was canceled by neurology. Patient has an NIH score of 0. He has been offered evaluation for TIAs which she has refused at this time. The plan will be for the patient to complete his blood work and he will be sent to the CDU for this. If he changes his mind, he will be placed on the TIA. The risk of devastating ischemic stroke was  explained to him he understands this.        Toy Baker, MD 02/02/12 [phone]

## 2012-02-02 NOTE — ED Notes (Signed)
Food tray ordered for pt

## 2012-02-03 ENCOUNTER — Observation Stay (HOSPITAL_COMMUNITY): Payer: Federal, State, Local not specified - PPO

## 2012-02-03 LAB — LIPID PANEL
Cholesterol: 162 mg/dL (ref 0–200)
HDL: 34 mg/dL — ABNORMAL LOW (ref >39)
Total CHOL/HDL Ratio: 4.8 RATIO

## 2012-02-03 LAB — HEMOGLOBIN A1C
Hgb A1c MFr Bld: 6.3 % — ABNORMAL HIGH (ref <5.7)
Mean Plasma Glucose: 134 mg/dL — ABNORMAL HIGH (ref <117)

## 2012-02-03 MED ORDER — ASPIRIN 81 MG PO CHEW
81.0000 mg | CHEWABLE_TABLET | Freq: Once | ORAL | Status: AC
  Administered 2012-02-03: 81 mg via ORAL
  Filled 2012-02-03: qty 1

## 2012-02-03 NOTE — ED Notes (Signed)
Carotid doppler being performed at bedside

## 2012-02-03 NOTE — ED Notes (Signed)
Echo at bedside.  Pt assisted to that bathroom.  Pt ambulated well.

## 2012-02-03 NOTE — ED Notes (Signed)
PA at bedside.

## 2012-02-03 NOTE — ED Provider Notes (Signed)
7:50 AM Patient is in CDU under observation.  This is a shared visit with Dr Freida Busman.  Patient initially brought in as a code stroke, cancelled by neurology.  Plan was for TIA protocol though patient is unable to have MRI due to cochlear implants.  Discussed patient this morning with Dr Freida Busman.  Plan is for doppler US of carotids and echo, then call to discuss with neurology.  Neurology has written a note that anticipates MRI to be done.  Will discuss plan and dispo.  Patient currently sleeping soundly.  No acute events overnight.  Pt states he has not had any difficulty walking since the event yesterday, states he didn't need to stay in the hospital but his daughters made him.  No needs at this time.  Discussed plan with patient.  Vascular tech has arrived to take patient for his studies.    9:14 AM Dr Patty Sermons has read the echocardiogram and states that it is normal.  Per nurse, carotid dopplers also reported to be normal.  Will consult neurology for recommendations.   10:11 AM CN II-XII intact, EOMs intact, no pronator drift, grip strengths equal bilaterally; strength 5/5 in all extremities, sensation intact in all extremities; finger to nose, heel to shin, rapid alternating movements normal; gait is normal, Romberg is negative.    10:53 AM I spoke with Dr Eilleen Kempf, neurology, who also saw patient yesterday.  He recommends repeat CT as patient is unable to have MRI.  If CT is negative, pt may be d/c home with daily aspirin and smoking cessation recommendations.  I have discussed this plan with patient and have strongly advised daily aspirin.  Dr Eilleen Kempf states 81mg  is fine.    11:15 AM Second CT head is negative.  Discussed result with patient.  Pt states all other results including cholesterol have been discussed with him already.  Pt to be d/c home and follow up with Dr Alwyn Ren (PCP).   Pt given return precautions.  Pt verbalizes understanding and agrees with plan.     1:12 PM Following discharge, Dr Eilleen Kempf  called again to confirm the negative head CT.  Notes that he thinks it is unlikely patient had a TIA and that his symptoms may have been due to DDD and gout making his gout unsteady after walking the golf course for an extended period of time.    Results for orders placed during the hospital encounter of 02/02/12  POCT I-STAT, CHEM 8      Component Value Range   Sodium 142  135 - 145 mEq/L   Potassium 3.6  3.5 - 5.1 mEq/L   Chloride 108  96 - 112 mEq/L   BUN 28 (*) 6 - 23 mg/dL   Creatinine, Ser 7.84 (*) 0.50 - 1.35 mg/dL   Glucose, Bld 696 (*) 70 - 99 mg/dL   Calcium, Ion 2.95 (*) 1.13 - 1.30 mmol/L   TCO2 24  0 - 100 mmol/L   Hemoglobin 13.3  13.0 - 17.0 g/dL   HCT 28.4  13.2 - 44.0 %  CBC WITH DIFFERENTIAL      Component Value Range   WBC 5.1  4.0 - 10.5 K/uL   RBC 4.41  4.22 - 5.81 MIL/uL   Hemoglobin 13.4  13.0 - 17.0 g/dL   HCT 10.2 (*) 72.5 - 36.6 %   MCV 87.8  78.0 - 100.0 fL   MCH 30.4  26.0 - 34.0 pg   MCHC 34.6  30.0 - 36.0 g/dL   RDW  13.8  11.5 - 15.5 %   Platelets 195  150 - 400 K/uL   Neutrophils Relative 52  43 - 77 %   Lymphocytes Relative 30  12 - 46 %   Monocytes Relative 13 (*) 3 - 12 %   Eosinophils Relative 4  0 - 5 %   Basophils Relative 1  0 - 1 %   Neutro Abs 2.6  1.7 - 7.7 K/uL   Lymphs Abs 1.5  0.7 - 4.0 K/uL   Monocytes Absolute 0.7  0.1 - 1.0 K/uL   Eosinophils Absolute 0.2  0.0 - 0.7 K/uL   Basophils Absolute 0.1  0.0 - 0.1 K/uL   Smear Review MORPHOLOGY UNREMARKABLE    COMPREHENSIVE METABOLIC PANEL      Component Value Range   Sodium 141  135 - 145 mEq/L   Potassium 3.6  3.5 - 5.1 mEq/L   Chloride 104  96 - 112 mEq/L   CO2 26  19 - 32 mEq/L   Glucose, Bld 131 (*) 70 - 99 mg/dL   BUN 28 (*) 6 - 23 mg/dL   Creatinine, Ser 1.61 (*) 0.50 - 1.35 mg/dL   Calcium 09.6 (*) 8.4 - 10.5 mg/dL   Total Protein 7.3  6.0 - 8.3 g/dL   Albumin 4.0  3.5 - 5.2 g/dL   AST 27  0 - 37 U/L   ALT 16  0 - 53 U/L   Alkaline Phosphatase 45  39 - 117 U/L   Total  Bilirubin 0.4  0.3 - 1.2 mg/dL   GFR calc non Af Amer 48 (*) >90 mL/min   GFR calc Af Amer 55 (*) >90 mL/min  ETHANOL      Component Value Range   Alcohol, Ethyl (B) <11  0 - 11 mg/dL  URINE RAPID DRUG SCREEN (HOSP PERFORMED)      Component Value Range   Opiates NONE DETECTED  NONE DETECTED   Cocaine NONE DETECTED  NONE DETECTED   Benzodiazepines NONE DETECTED  NONE DETECTED   Amphetamines NONE DETECTED  NONE DETECTED   Tetrahydrocannabinol NONE DETECTED  NONE DETECTED   Barbiturates NONE DETECTED  NONE DETECTED  POCT I-STAT TROPONIN I      Component Value Range   Troponin i, poc 0.01  0.00 - 0.08 ng/mL   Comment 3           PROTIME-INR      Component Value Range   Prothrombin Time 13.6  11.6 - 15.2 seconds   INR 1.02  0.00 - 1.49  APTT      Component Value Range   aPTT 25  24 - 37 seconds  URINALYSIS, ROUTINE W REFLEX MICROSCOPIC      Component Value Range   Color, Urine YELLOW  YELLOW   APPearance CLEAR  CLEAR   Specific Gravity, Urine 1.017  1.005 - 1.030   pH 5.0  5.0 - 8.0   Glucose, UA NEGATIVE  NEGATIVE mg/dL   Hgb urine dipstick NEGATIVE  NEGATIVE   Bilirubin Urine NEGATIVE  NEGATIVE   Ketones, ur NEGATIVE  NEGATIVE mg/dL   Protein, ur NEGATIVE  NEGATIVE mg/dL   Urobilinogen, UA 0.2  0.0 - 1.0 mg/dL   Nitrite NEGATIVE  NEGATIVE   Leukocytes, UA NEGATIVE  NEGATIVE  LIPID PANEL      Component Value Range   Cholesterol 162  0 - 200 mg/dL   Triglycerides 045 (*) <150 mg/dL   HDL 34 (*) >40 mg/dL   Total  CHOL/HDL Ratio 4.8     VLDL 42 (*) 0 - 40 mg/dL   LDL Cholesterol 86  0 - 99 mg/dL  HEMOGLOBIN W0J      Component Value Range   Hemoglobin A1C 6.3 (*) <5.7 %   Mean Plasma Glucose 134 (*) <117 mg/dL  CARDIAC PANEL(CRET KIN+CKTOT+MB+TROPI)      Component Value Range   Total CK 242 (*) 7 - 232 U/L   CK, MB 7.7 (*) 0.3 - 4.0 ng/mL   Troponin I <0.30  <0.30 ng/mL   Relative Index 3.2 (*) 0.0 - 2.5   Ct Head Wo Contrast  02/03/2012  *RADIOLOGY REPORT*   Clinical Data: Reevaluate for possible stroke.  The patient presented as code stroke yesterday.  Unable to have a MRI. History CAD, hypertension, CABG, cochlear implant.  CT HEAD WITHOUT CONTRAST  Technique:  Contiguous axial images were obtained from the base of the skull through the vertex without contrast.  Comparison: 02/02/2012  Findings: There is significant artifact from cochlear implant. There is no intra or extra-axial fluid collection or mass lesion. The basilar cisterns and ventricles have a normal appearance. There is no CT evidence for acute infarction or hemorrhage. There is mild periventricular white matter change, consistent with small vessel disease.  Bone windows show there is atherosclerotic calcification of the internal carotid arteries.  IMPRESSION: No evidence for acute intracranial abnormality.  Original Report Authenticated By: Patterson Hammersmith, M.D.   Ct Head Wo Contrast  02/02/2012  *RADIOLOGY REPORT*  Clinical Data: 76 year old male with unsteady gait, slurred speech, hypertensive.  Cochlear implant.  CT HEAD WITHOUT CONTRAST  Technique:  Contiguous axial images were obtained from the base of the skull through the vertex without contrast.  Comparison: None.  Findings: Sequelae of right cochlear implant. Visualized paranasal sinuses and mastoids are clear.  No acute osseous abnormality identified.  No acute orbit or scalp soft tissue findings.  Moderate Calcified atherosclerosis at the skull base.  Streak artifact from the cochlear implant.  No ventriculomegaly. No midline shift, mass effect, or evidence of mass lesion.  There is some disproportionate volume loss of the cerebellum. No acute intracranial hemorrhage identified.  Patchy bilateral cerebral white matter hypodensity. No evidence of cortically based acute infarction identified.  No suspicious intracranial vascular hyperdensity.  IMPRESSION: 1. No acute intracranial abnormality. 2.  Mild for age nonspecific white matter  changes. 3.  Sequelae of right cochlear implant.  Critical Value/emergent results were called by telephone at the time of interpretation on 02/02/2012 at 1435 hours to Dr. Lorre Nick, who verbally acknowledged these results.  Original Report Authenticated By: Harley Hallmark, M.D.      [place], [place] 02/03/12 1320

## 2012-02-03 NOTE — Progress Notes (Signed)
*  PRELIMINARY RESULTS* Vascular Ultrasound Carotid Duplex (Doppler) has been completed.   No evidence of significant internal carotid artery stenosis bilaterally. Bilateral antegrade vertebral artery flow.  02/03/2012 10:07 AM Gertie Fey, RDMS, RDCS

## 2012-02-03 NOTE — ED Provider Notes (Signed)
Medical screening examination/treatment/procedure(s) were performed by non-physician practitioner and as supervising physician I was immediately available for consultation/collaboration.   Carleene Cooper III, MD 02/03/12 [phone]

## 2012-02-03 NOTE — Progress Notes (Signed)
  Echocardiogram 2D Echocardiogram has been performed.  Juan [name] 02/03/2012, 8:50 AM

## 2012-02-03 NOTE — ED Notes (Signed)
Patient transported to CT 

## 2012-02-04 NOTE — ED Provider Notes (Signed)
Medical screening examination/treatment/procedure(s) were conducted as a shared visit with non-physician practitioner(s) and myself.  I personally evaluated the patient during the encounter  Lamonta Cypress T Aaima Gaddie, MD 02/04/12 0833 

## 2012-02-07 ENCOUNTER — Ambulatory Visit (INDEPENDENT_AMBULATORY_CARE_PROVIDER_SITE_OTHER): Payer: Federal, State, Local not specified - PPO | Admitting: Internal Medicine

## 2012-02-07 ENCOUNTER — Encounter: Payer: Self-pay | Admitting: Internal Medicine

## 2012-02-07 VITALS — BP 130/72 | HR 60 | Temp 97.9°F | Wt 192.8 lb

## 2012-02-07 DIAGNOSIS — G459 Transient cerebral ischemic attack, unspecified: Secondary | ICD-10-CM | POA: Insufficient documentation

## 2012-02-07 DIAGNOSIS — R2681 Unsteadiness on feet: Secondary | ICD-10-CM

## 2012-02-07 DIAGNOSIS — R269 Unspecified abnormalities of gait and mobility: Secondary | ICD-10-CM

## 2012-02-07 NOTE — Progress Notes (Signed)
  Subjective:    Patient ID: Juan [name], male    DOB: 24-Jan-1936, 76 y.o.   MRN: [id number]  HPI He experienced acute imbalance while walking @ a golf tournament, almost falling on 02/02/12. The symptoms were not related to change in head or body position. There was no other neurologic prodrome or cardiac prodrome. Because of a cochlear implant, MRI could not be completed. He had a CT of the brain without contrast performed 8/17 and 8/18. No acute change was noted  The CT reports and labs were reviewed. Creatinine was 1.41, calcium 10.7, GFR 48, hematocrit 38.7. Nonfasting glucose was 134 and triglycerides 209. LDL was 86.    Review of Systems He specifically denies associated headache, limb weakness, limb paresthesias, chest pain, palpitations, or shortness of breath prior to the event.     Objective:   Physical Exam He appears well-nourished and in no acute distress  Extraocular motion is intact without nystagmus. There is minimal lid lag.  The cochlear implant equipment is present on the right.  No carotid bruits are present. Has a loud S4; rhythm is regular  Deep tendon reflexes are brisk and equal bilaterally. Strength and tone are normal  Gait is slightly broad and flat footed.  Romberg and finger to nose testing are essentially normal.        Assessment & Plan:  #1 questionable TIA manifested as acute imbalance while walking. This is in the context of a cochlear implant and his having taken 81 mg of aspirin for years.  #2 mild hypercalcemia, new.  Plan: Neurology consultation will be pursued. The calcium will be monitored

## 2012-02-07 NOTE — Patient Instructions (Addendum)
Please  schedule fasting Labs : BMET,A1c in 6-8 weeks.PLEASE BRING THESE INSTRUCTIONS TO FOLLOW UP  LAB APPOINTMENT.This will guarantee correct labs are drawn, eliminating need for repeat blood sampling ( needle sticks ! ). Diagnoses /Codes: 790.29  Please take enteric-coated aspirin 325 mg daily with breakfast.

## 2012-03-11 ENCOUNTER — Other Ambulatory Visit (INDEPENDENT_AMBULATORY_CARE_PROVIDER_SITE_OTHER): Payer: Federal, State, Local not specified - PPO

## 2012-03-11 DIAGNOSIS — R7309 Other abnormal glucose: Secondary | ICD-10-CM

## 2012-03-11 LAB — BASIC METABOLIC PANEL
BUN: 29 mg/dL — ABNORMAL HIGH (ref 6–23)
Chloride: 104 mEq/L (ref 96–112)
Creatinine, Ser: 1.5 mg/dL (ref 0.4–1.5)
GFR: 48.61 mL/min — ABNORMAL LOW (ref >60.00)
Potassium: 4.2 mEq/L (ref 3.5–5.1)

## 2012-03-11 LAB — HEMOGLOBIN A1C: Hgb A1c MFr Bld: 6.3 % (ref 4.6–6.5)

## 2012-04-17 ENCOUNTER — Other Ambulatory Visit: Payer: Self-pay | Admitting: Internal Medicine

## 2012-04-28 ENCOUNTER — Ambulatory Visit (INDEPENDENT_AMBULATORY_CARE_PROVIDER_SITE_OTHER): Payer: Federal, State, Local not specified - PPO | Admitting: Family Medicine

## 2012-04-28 ENCOUNTER — Encounter: Payer: Self-pay | Admitting: Family Medicine

## 2012-04-28 VITALS — BP 122/66 | HR 68 | Temp 97.9°F | Wt 195.8 lb

## 2012-04-28 DIAGNOSIS — H6120 Impacted cerumen, unspecified ear: Secondary | ICD-10-CM

## 2012-04-28 NOTE — Patient Instructions (Signed)
Cerumen Impaction  A cerumen impaction is when the wax in your ear forms a plug. This plug usually causes reduced hearing. Sometimes it also causes an earache or dizziness. Removing a cerumen impaction can be difficult and painful. The wax sticks to the ear canal. The canal is sensitive and bleeds easily. If you try to remove a heavy wax buildup with a cotton tipped swab, you may push it in further.  Irrigation with water, suction, and small ear curettes may be used to clear out the wax. If the impaction is fixed to the skin in the ear canal, ear drops may be needed for a few days to loosen the wax. People who build up a lot of wax frequently can use ear wax removal products available in your local drugstore.  SEEK MEDICAL CARE IF:    You develop an earache, increased hearing loss, or marked dizziness.  Document Released: 07/12/2004 Document Revised: 08/27/2011 Document Reviewed: 09/01/2009  [employer] Patient Information 2013 [employer].

## 2012-04-28 NOTE — Progress Notes (Signed)
  Subjective:    Patient ID: Juan [name], male    DOB: [birth year], 76 y.o.   MRN: [id number]  HPI Pt here c/o  R ear pain.  He has a hearing aid and has never had this problem before.  No other symptoms or complaints.       Review of Systems As above    Objective:   Physical Exam BP 122/66  Pulse 68  Temp 97.9 F (36.6 C) (Oral)  Wt 195 lb 12.8 oz (88.814 kg)  SpO2 95% General appearance: alert, cooperative, appears stated age and no distress Ears: R ear -+ cerumen impaction----unable to irrigate even with sitting with drops in the ear for several minutes. Nose: Nares normal. Septum midline. Mucosa normal. No drainage or sinus tenderness. Throat: lips, mucosa, and tongue normal; teeth and gums normal Neck: no adenopathy, no carotid bruit, no JVD, supple, symmetrical, trachea midline and thyroid not enlarged, symmetric, no tenderness/mass/nodules Lungs: clear to auscultation bilaterally Heart: S1, S2 normal      Assessment & Plan:  r ear cerumen impaction---  Use debrox for 3-4 days and then return for [place] to irrigate

## 2012-05-02 ENCOUNTER — Ambulatory Visit (INDEPENDENT_AMBULATORY_CARE_PROVIDER_SITE_OTHER): Payer: Federal, State, Local not specified - PPO | Admitting: Family Medicine

## 2012-05-02 ENCOUNTER — Encounter: Payer: Self-pay | Admitting: Family Medicine

## 2012-05-02 VITALS — BP 116/70 | HR 78 | Temp 97.4°F | Wt 194.0 lb

## 2012-05-02 DIAGNOSIS — H6120 Impacted cerumen, unspecified ear: Secondary | ICD-10-CM

## 2012-05-02 DIAGNOSIS — Z23 Encounter for immunization: Secondary | ICD-10-CM

## 2012-05-02 NOTE — Assessment & Plan Note (Signed)
Refer to ENT for cerumen removal

## 2012-05-02 NOTE — Patient Instructions (Signed)
Cerumen Impaction  A cerumen impaction is when the wax in your ear forms a plug. This plug usually causes reduced hearing. Sometimes it also causes an earache or dizziness. Removing a cerumen impaction can be difficult and painful. The wax sticks to the ear canal. The canal is sensitive and bleeds easily. If you try to remove a heavy wax buildup with a cotton tipped swab, you may push it in further.  Irrigation with water, suction, and small ear curettes may be used to clear out the wax. If the impaction is fixed to the skin in the ear canal, ear drops may be needed for a few days to loosen the wax. People who build up a lot of wax frequently can use ear wax removal products available in your local drugstore.  SEEK MEDICAL CARE IF:    You develop an earache, increased hearing loss, or marked dizziness.  Document Released: 07/12/2004 Document Revised: 08/27/2011 Document Reviewed: 09/01/2009  [employer] Patient Information 2013 [employer].

## 2012-05-02 NOTE — Progress Notes (Signed)
  Subjective:    Patient ID: Juan [name], male    DOB: January 13, 1936, 76 y.o.   MRN: [id number]  HPI Pt here f/u cerumen impaction.  He was here earlier this week and we were unsuccessful with irrigation.  He used debrox all week and is back today to try again.   Review of Systems As above    Objective:   Physical Exam  Constitutional: He appears well-developed and well-nourished.  HENT:       R ear--+ cerumen impaction---unable to remove cerumen completely---irrigation removed some but not all.  i attempted to remove it with hoop--- caused too much pain  Psychiatric: He has a normal mood and affect. His behavior is normal. Judgment and thought content normal.          Assessment & Plan:

## 2012-06-05 ENCOUNTER — Other Ambulatory Visit: Payer: Self-pay | Admitting: Cardiovascular Disease

## 2012-06-05 DIAGNOSIS — I2510 Atherosclerotic heart disease of native coronary artery without angina pectoris: Secondary | ICD-10-CM

## 2012-06-05 DIAGNOSIS — E782 Mixed hyperlipidemia: Secondary | ICD-10-CM

## 2012-06-05 MED ORDER — PRAVASTATIN SODIUM 20 MG PO TABS: 20.0000 mg | ORAL_TABLET | Freq: Every day | ORAL | Status: DC

## 2012-07-30 ENCOUNTER — Other Ambulatory Visit: Payer: Self-pay | Admitting: Cardiology

## 2012-09-01 ENCOUNTER — Other Ambulatory Visit: Payer: Self-pay | Admitting: *Deleted

## 2012-09-01 DIAGNOSIS — I2510 Atherosclerotic heart disease of native coronary artery without angina pectoris: Secondary | ICD-10-CM

## 2012-09-01 MED ORDER — PRAVASTATIN SODIUM 20 MG PO TABS: 20.0000 mg | ORAL_TABLET | Freq: Every day | ORAL | Status: DC

## 2012-10-04 ENCOUNTER — Other Ambulatory Visit: Payer: Self-pay | Admitting: Cardiology

## 2012-10-23 ENCOUNTER — Other Ambulatory Visit: Payer: Self-pay | Admitting: Internal Medicine

## 2012-11-12 ENCOUNTER — Other Ambulatory Visit: Payer: Self-pay | Admitting: *Deleted

## 2012-11-12 MED ORDER — METOPROLOL TARTRATE 50 MG PO TABS: ORAL_TABLET | ORAL | Status: DC

## 2012-12-03 ENCOUNTER — Other Ambulatory Visit: Payer: Self-pay | Admitting: *Deleted

## 2012-12-03 DIAGNOSIS — I2510 Atherosclerotic heart disease of native coronary artery without angina pectoris: Secondary | ICD-10-CM

## 2012-12-03 DIAGNOSIS — E782 Mixed hyperlipidemia: Secondary | ICD-10-CM

## 2012-12-03 MED ORDER — PRAVASTATIN SODIUM 20 MG PO TABS: 20.0000 mg | ORAL_TABLET | Freq: Every day | ORAL | Status: DC

## 2012-12-03 NOTE — Telephone Encounter (Signed)
Pt needs appt to obtain more refills 

## 2012-12-22 ENCOUNTER — Other Ambulatory Visit: Payer: Self-pay | Admitting: Internal Medicine

## 2012-12-22 NOTE — Telephone Encounter (Signed)
Left message on voicemail requesting patient to return call when available. Reason for call (not left on VM), the requested medication not filled since 2012. ? Patient's need for medication

## 2012-12-23 NOTE — Telephone Encounter (Signed)
Left message on VM for patient to return call when available  

## 2012-12-24 ENCOUNTER — Encounter: Payer: Self-pay | Admitting: Physician Assistant

## 2012-12-24 ENCOUNTER — Ambulatory Visit (INDEPENDENT_AMBULATORY_CARE_PROVIDER_SITE_OTHER): Payer: Federal, State, Local not specified - PPO | Admitting: Physician Assistant

## 2012-12-24 VITALS — BP 152/85 | HR 73 | Ht 71.0 in | Wt 197.0 lb

## 2012-12-24 DIAGNOSIS — E782 Mixed hyperlipidemia: Secondary | ICD-10-CM

## 2012-12-24 DIAGNOSIS — I10 Essential (primary) hypertension: Secondary | ICD-10-CM

## 2012-12-24 DIAGNOSIS — I2510 Atherosclerotic heart disease of native coronary artery without angina pectoris: Secondary | ICD-10-CM

## 2012-12-24 DIAGNOSIS — N189 Chronic kidney disease, unspecified: Secondary | ICD-10-CM

## 2012-12-24 NOTE — Telephone Encounter (Signed)
Patient is calling back to follow-up. Attempted to address reason for call but patient kept asking to speak with Chrae.

## 2012-12-24 NOTE — Telephone Encounter (Signed)
I spoke with patient, patient states he takes this medication when he has gout attack, patient schedule appointment for tomorrow to be seen for gout

## 2012-12-24 NOTE — Progress Notes (Signed)
[address] [place], [place]  [address] Phone: [phone] Fax:  [phone]  Date:  12/24/2012   ID:  Juan [name], DOB [birth year], MRN [id number]  PCP:  Marga Melnick, MD  Cardiologist:  Dr. Charlton Haws     History of Present Illness: Juan [name] is a 77 y.o. male who returns for follow up.  He has a hx of CAD, s/p CABG in 2000, HTN, HL, deafness s/p cochlear implant.  Last myoview 9/08: inf-septal scar with mild peri-infarct ischemia (low risk).  Last seen by Dr. Charlton Haws in 03/2011.  He was seen in the ED with acute onset of imbalance in 01/2012.  Seen by neurology.  Head CT (MRI not done b/c of cochlear implant) was neg.  Carotid U/S: no ICA stenosis.  Echo 8/13: mild focal basal septal hypertrophy, Gr 1 DD, mild LAE.  Saw neurology in f/u and episode was felt to be either dehydration or a TIA.  ASA was continued.  Since last seen he is doing well.  The patient denies chest pain, shortness of breath, syncope, orthopnea, PND or significant pedal edema.  Labs (8/13):  K 3.6, Cr 1.41, ALT 16, LDL 86, Hgb 13.4 Labs (9/13):  K 4.2, Cr 1.5   Wt Readings from Last 3 Encounters:  12/24/12 197 lb (89.359 kg)  05/02/12 194 lb (87.998 kg)  04/28/12 195 lb 12.8 oz (88.814 kg)     Past Medical History  Diagnosis Date  . Hyperlipidemia   . Duodenitis     PMH of  . Fasting hyperglycemia   . Gout   . Diverticular disease   . Elevated PSA   . Tubular adenoma polyp of rectum   . Pancreatitis     ? Simvastatin related  . GERD (gastroesophageal reflux disease)   . Hydronephrosis with ureteropelvic junction obstruction   . Hypertension   . TIA (transient ischemic attack) 02/02/2012    presentation as acute imbalance;  carotid U/S negative  . Hypercalcemia 02/02/12     10.7  . Coronary artery disease     a. s/p CABG 2000; b. myoview 9/08: inf-septal scar with mild peri-infarct ischemia (low risk);  c.  Echo 8/13: mild focal basal septal hypertrophy, Gr 1 DD,  mild LAE    Current Outpatient Prescriptions  Medication Sig Dispense Refill  . aspirin 81 MG tablet Take 81 mg by mouth daily.        Marland Kitchen dutasteride (AVODART) 0.5 MG capsule Take 0.5 mg by mouth daily.        . metoprolol (LOPRESSOR) 50 MG tablet TAKE 1 TABLET (50 MG TOTAL) BY MOUTH 2 (TWO) TIMES DAILY.  30 tablet  0  . pravastatin (PRAVACHOL) 20 MG tablet Take 1 tablet (20 mg total) by mouth daily.  30 tablet  0  . TRILIPIX 135 MG capsule TAKE ONE CAPSULE EVERY DAY  90 capsule  0   Current Facility-Administered Medications  Medication Dose Route Frequency Provider Last Rate Last Dose  . 0.9 %  sodium chloride infusion  500 mL Intravenous Continuous Meryl Dare, MD        Allergies:    Allergies  Allergen Reactions  . Simvastatin     REACTION:  Possible statin induced pancreatitis    Social History:  The patient  reports that he has quit smoking. His smoking use included Cigars. He has never used smokeless tobacco. He reports that he does not drink alcohol or use illicit drugs.  ROS:  Please see the history of present illness.   He gets dizzy on occasion.   All other systems reviewed and negative.   PHYSICAL EXAM: VS:  BP 152/85  Pulse 73  Ht 5\' 11"  (1.803 m)  Wt 197 lb (89.359 kg)  BMI 27.49 kg/m2 Well nourished, well developed, in no acute distress HEENT: normal Neck: no JVD Cardiac:  normal S1, S2; RRR; no murmur Lungs:  clear to auscultation bilaterally, no wheezing, rhonchi or rales Abd: soft, nontender, no hepatomegaly Ext: no edema Skin: warm and dry Neuro:  CNs 2-12 intact, no focal abnormalities noted  EKG:  NSR, HR 73, unusual P axis, NSSTTW changes, no change from prior tracings     ASSESSMENT AND PLAN:  1. CAD:  Stable.  No angina.  Continue ASA and statin. 2. Hypertension:  Usually better controlled.  Continue current Rx.  Continue to monitor.  3. Hyperlipidemia:  Recent LDL optimal.  4. CKD:  Creatinine stable. 5. ?TIA:  Continue ASA.  No  recurrence. 6. Disposition:  F/u with Dr. Charlton Haws    Signed, Tereso Newcomer, PA-C  12/24/2012 4:23 PM

## 2012-12-24 NOTE — Patient Instructions (Addendum)
Your physician wants you to follow-up in: 6 MONTHS WITH DR. Eden Emms. You will receive a reminder letter in the mail two months in advance. If you don't receive a letter, please call our office to schedule the follow-up appointment.   NO CHANGES WERE MADE TODAY

## 2012-12-25 ENCOUNTER — Ambulatory Visit (INDEPENDENT_AMBULATORY_CARE_PROVIDER_SITE_OTHER): Payer: Federal, State, Local not specified - PPO | Admitting: Internal Medicine

## 2012-12-25 ENCOUNTER — Encounter: Payer: Self-pay | Admitting: Internal Medicine

## 2012-12-25 VITALS — BP 132/80 | HR 63 | Temp 97.7°F | Wt 196.0 lb

## 2012-12-25 DIAGNOSIS — E782 Mixed hyperlipidemia: Secondary | ICD-10-CM

## 2012-12-25 DIAGNOSIS — M109 Gout, unspecified: Secondary | ICD-10-CM

## 2012-12-25 DIAGNOSIS — N259 Disorder resulting from impaired renal tubular function, unspecified: Secondary | ICD-10-CM

## 2012-12-25 DIAGNOSIS — E119 Type 2 diabetes mellitus without complications: Secondary | ICD-10-CM

## 2012-12-25 LAB — MICROALBUMIN / CREATININE URINE RATIO: Creatinine,U: 112.7 mg/dL

## 2012-12-25 LAB — BASIC METABOLIC PANEL
BUN: 29 mg/dL — ABNORMAL HIGH (ref 6–23)
CO2: 31 mEq/L (ref 19–32)
Calcium: 10.1 mg/dL (ref 8.4–10.5)
GFR: 50.84 mL/min — ABNORMAL LOW (ref >60.00)
Glucose, Bld: 143 mg/dL — ABNORMAL HIGH (ref 70–99)
Potassium: 3.8 mEq/L (ref 3.5–5.1)

## 2012-12-25 NOTE — Assessment & Plan Note (Signed)
BMET 

## 2012-12-25 NOTE — Assessment & Plan Note (Signed)
Uric acid level. Continue Meloxicam temporarily. ? Allopurinol if renal function OK To prevent gout the minimal uric acid goal is < 7; preferred is < 6, ideal or protective value is < 5 .  The most common cause of elevated uric acid is the ingestion of sugar from high fructose corn syrup sources in processed foods & drinks(see Lipids) .Chronic elevation of uric acid is not an isolated arthritic issue;it also increases cardiac & kidney disease risk.

## 2012-12-25 NOTE — Assessment & Plan Note (Signed)
A1c microalbumin 

## 2012-12-25 NOTE — Progress Notes (Signed)
  Subjective:    Patient ID: DOCTOR SHEAHAN, male    DOB: 1935/08/01, 77 y.o.   MRN: [id number]  HPI  He had a flare of gout 12/17/12 as L podraga ; previously it has sometimes occurred  on  R . He takes meloxicam 15 mg daily on an as-needed basis for acute gout with good response. He was prescribed 20 tablets on 12/08/10 & has 3 remaining.  His gout history was updated in the problem list. There are no specific triggers  He has had elevated triglycerides in the past.  His uric acid in 2013 ranged from a low of 5.1-high of 9.2.  He is not on her thiazide. He has not taken allopurinol.    Review of Systems   He has been on carbohydrate restricted diet to control fasting hyperglycemia. His A1c has been as high as 6.5% and June of 2013. He denies polyuria, polyphagia, or polydipsia.     Objective:   Physical Exam Gen.: Healthy and well-nourished in appearance. Alert, appropriate and cooperative throughout exam.Appears younger than stated age    Eyes: No corneal or conjunctival inflammation noted.  Ears:  Hearing is grossly decreased despite device on R. Nose: External nasal exam reveals no deformity or inflammation.  Mouth: Oral mucosa and oropharynx reveal no lesions or exudates. Teeth in good repair. Neck: No deformities, masses, or tenderness noted.  Thyroid normal. Lungs: Normal respiratory effort; chest expands symmetrically. Lungs are clear to auscultation without rales, wheezes, or increased work of breathing. Heart: Normal rate and rhythm. Normal S1 and S2. No gallop, click, or rub. S4 w/o murmur. Abdomen: Bowel sounds normal; abdomen soft and nontender. No masses, organomegaly or hernias noted.                              Musculoskeletal/extremities: There is some asymmetry of the posterior thoracic musculature suggesting occult scoliosis. No clubbing, cyanosis, or ankle edema noted. Range of motion normal .Tone & strength  Normal. Joints  reveal  Classic L great toe podagra  changes with tenderness, erythema & swelling. Toe nails thickened. Able to lie down & sit up w/o help. Negative SLR bilaterally. Crepitus of knees Vascular: Carotid, radial artery, dorsalis pedis and  posterior tibial pulses are full and equal. No bruits present. Neurologic: Alert and oriented x3.  Skin: Intact without suspicious lesions or rashes. Lymph: No cervical, axillary lymphadenopathy present. Psych: Mood and affect are normal. Normally interactive                                                                                        Assessment & Plan:  See Current Assessment & Plan in Problem List under specific Diagnosis

## 2012-12-25 NOTE — Assessment & Plan Note (Signed)
Consume less than 40 grams of sugar (preferably ZERO) per day from foods & drinks with High Fructose Corn Sugar as #1,2,3 or # 4 on label. Fasting lipids after 4 mos

## 2012-12-25 NOTE — Patient Instructions (Addendum)
If you activate the  My Chart system; lab & Xray results will be released directly  to you as soon as I review & address these through the computer. As per [place] all records must be reviewed and signed off within 72 hours; but I attempt to complete this within 24-36 hours .If you choose not to sign up for My Chart within 24 hours of labs being drawn; results will be reviewed & interpretation added before being copied & mailed, causing a delay in getting the results to you.If you do not receive that report within 7-10 days ,please call. Additionally you can use this system to gain direct  access to your records  if  out of town or @ an office of a  physician who is not in  the My Chart network.  This improves continuity of care & places you in control of your medical record.

## 2012-12-26 ENCOUNTER — Telehealth: Payer: Self-pay

## 2012-12-26 ENCOUNTER — Encounter: Payer: Self-pay | Admitting: *Deleted

## 2012-12-26 MED ORDER — MELOXICAM 7.5 MG PO TABS: ORAL_TABLET | ORAL | Status: DC

## 2012-12-26 MED ORDER — ALLOPURINOL 100 MG PO TABS: 100.0000 mg | ORAL_TABLET | Freq: Every day | ORAL | Status: DC

## 2012-12-26 NOTE — Telephone Encounter (Signed)
Message copied by Maurice Small on Fri Dec 26, 2012  1:17 PM ------      Message from: Pecola Lawless      Created: Fri Dec 26, 2012 12:48 PM       Please send new  Rxs  For Allopurinol 100 mg qd # 30, R X 3 & Meoxicam 7.5 mg bid prn acute gout # 30 with lab results ------

## 2012-12-30 ENCOUNTER — Encounter: Payer: Self-pay | Admitting: Internal Medicine

## 2012-12-31 MED ORDER — MELOXICAM 7.5 MG PO TABS: ORAL_TABLET | ORAL | Status: DC

## 2012-12-31 NOTE — Telephone Encounter (Signed)
Rx sent 

## 2013-01-05 ENCOUNTER — Other Ambulatory Visit: Payer: Self-pay | Admitting: *Deleted

## 2013-01-05 MED ORDER — METOPROLOL TARTRATE 50 MG PO TABS: ORAL_TABLET | ORAL | Status: DC

## 2013-01-05 NOTE — Telephone Encounter (Signed)
Refill sent.

## 2013-02-02 ENCOUNTER — Other Ambulatory Visit: Payer: Self-pay | Admitting: Internal Medicine

## 2013-02-02 DIAGNOSIS — E785 Hyperlipidemia, unspecified: Secondary | ICD-10-CM

## 2013-02-11 ENCOUNTER — Other Ambulatory Visit: Payer: Self-pay | Admitting: Cardiovascular Disease

## 2013-02-26 ENCOUNTER — Other Ambulatory Visit: Payer: Self-pay | Admitting: Cardiovascular Disease

## 2013-03-06 ENCOUNTER — Other Ambulatory Visit: Payer: Self-pay | Admitting: Internal Medicine

## 2013-03-11 NOTE — Telephone Encounter (Signed)
Med filled and letter mailed to pt to schedule follow up lab appt.

## 2013-03-12 ENCOUNTER — Ambulatory Visit: Payer: Federal, State, Local not specified - PPO

## 2013-03-17 ENCOUNTER — Other Ambulatory Visit: Payer: Self-pay | Admitting: Cardiovascular Disease

## 2013-04-17 ENCOUNTER — Ambulatory Visit (INDEPENDENT_AMBULATORY_CARE_PROVIDER_SITE_OTHER): Payer: Federal, State, Local not specified - PPO | Admitting: Internal Medicine

## 2013-04-17 ENCOUNTER — Encounter: Payer: Self-pay | Admitting: Internal Medicine

## 2013-04-17 VITALS — BP 162/97 | HR 66 | Temp 98.2°F | Wt 195.4 lb

## 2013-04-17 DIAGNOSIS — N259 Disorder resulting from impaired renal tubular function, unspecified: Secondary | ICD-10-CM

## 2013-04-17 DIAGNOSIS — E782 Mixed hyperlipidemia: Secondary | ICD-10-CM

## 2013-04-17 DIAGNOSIS — M109 Gout, unspecified: Secondary | ICD-10-CM

## 2013-04-17 DIAGNOSIS — Z23 Encounter for immunization: Secondary | ICD-10-CM

## 2013-04-17 DIAGNOSIS — E1129 Type 2 diabetes mellitus with other diabetic kidney complication: Secondary | ICD-10-CM

## 2013-04-17 NOTE — Assessment & Plan Note (Signed)
BMET 

## 2013-04-17 NOTE — Assessment & Plan Note (Signed)
A1c & urine microalbumin  BMET 

## 2013-04-17 NOTE — Patient Instructions (Signed)
Order for FASTING labs entered into  the computer; these will be performed Tues 11/4 at [address]  [place]. across from [facility]. No appointment is necessary.

## 2013-04-17 NOTE — Progress Notes (Signed)
  Subjective:    Patient ID: Juan [name], male    DOB: [birth year], 77 y.o.   MRN: [id number]  HPI  He has run out of his fenofibrate; lipids have not been checked since August 2013.  Also his last A1c was 7.8% 12/25/12.No glucose monitor    Review of Systems A  Low carb,heart healthy diet is followed; exercise encompasses 45 minutes 3 times per week as  without symptoms. Specifically denied are  chest pain, palpitations, dyspnea, or claudication.  There is medication compliance with the fenofibrate. Significant abdominal symptoms, memory deficit, or myalgias denied. Polyuria, polyphagia, polydipsia absent. There is no blurred vision, double vision, or loss of vision. Ophthalmologic exam is current ;no retinopathy present. Also denied are numbness, tingling, or burning of the extremities. No nonhealing skin lesions present. Weight is stable.      Objective:   Physical Exam Appears healthy and well-nourished & in no acute distress  No carotid bruits are present.No neck pain distention present at 10 - 15 degrees. Thyroid normal to palpation  Heart rhythm and rate are normal with no significant murmurs or gallops. S4  Chest is clear with no increased work of breathing  There is no evidence of aortic aneurysm or renal artery bruits  Abdomen soft with no organomegaly or masses. No HJR  No clubbing, cyanosis or edema present.  Pedal pulses are intact   No ischemic skin changes are present . Nails healthy    Alert and oriented. Strength, tone normal          Assessment & Plan:  See Current Assessment & Plan in Problem List under specific Diagnosis

## 2013-04-17 NOTE — Assessment & Plan Note (Signed)
Lipids , AST, ALT

## 2013-04-17 NOTE — Assessment & Plan Note (Signed)
Uric acid

## 2013-04-21 ENCOUNTER — Other Ambulatory Visit (INDEPENDENT_AMBULATORY_CARE_PROVIDER_SITE_OTHER): Payer: Federal, State, Local not specified - PPO

## 2013-04-21 ENCOUNTER — Other Ambulatory Visit: Payer: Self-pay | Admitting: Cardiovascular Disease

## 2013-04-21 DIAGNOSIS — E1129 Type 2 diabetes mellitus with other diabetic kidney complication: Secondary | ICD-10-CM

## 2013-04-21 DIAGNOSIS — M109 Gout, unspecified: Secondary | ICD-10-CM

## 2013-04-21 DIAGNOSIS — E782 Mixed hyperlipidemia: Secondary | ICD-10-CM

## 2013-04-21 LAB — BASIC METABOLIC PANEL
BUN: 26 mg/dL — ABNORMAL HIGH (ref 6–23)
Calcium: 9.8 mg/dL (ref 8.4–10.5)
Creatinine, Ser: 1.3 mg/dL (ref 0.4–1.5)
GFR: 58.2 mL/min — ABNORMAL LOW (ref >60.00)
Glucose, Bld: 173 mg/dL — ABNORMAL HIGH (ref 70–99)
Potassium: 4 mEq/L (ref 3.5–5.1)
Sodium: 138 mEq/L (ref 135–145)

## 2013-04-21 LAB — LIPID PANEL
Cholesterol: 186 mg/dL (ref 0–200)
HDL: 34.4 mg/dL — ABNORMAL LOW (ref >39.00)
Total CHOL/HDL Ratio: 5
Triglycerides: 214 mg/dL — ABNORMAL HIGH (ref 0.0–149.0)

## 2013-04-21 LAB — URIC ACID: Uric Acid, Serum: 6.4 mg/dL (ref 4.0–7.8)

## 2013-04-21 LAB — MICROALBUMIN / CREATININE URINE RATIO
Creatinine,U: 122.7 mg/dL
Microalb Creat Ratio: 0.9 mg/g (ref 0.0–30.0)

## 2013-04-21 LAB — HEMOGLOBIN A1C: Hgb A1c MFr Bld: 8 % — ABNORMAL HIGH (ref 4.6–6.5)

## 2013-04-21 LAB — LDL CHOLESTEROL, DIRECT: Direct LDL: 120.1 mg/dL

## 2013-04-23 ENCOUNTER — Other Ambulatory Visit: Payer: Self-pay | Admitting: Cardiovascular Disease

## 2013-04-30 ENCOUNTER — Other Ambulatory Visit: Payer: Self-pay | Admitting: *Deleted

## 2013-04-30 MED ORDER — FENOFIBRIC ACID 135 MG PO CPDR: DELAYED_RELEASE_CAPSULE | ORAL | Status: DC

## 2013-04-30 NOTE — Telephone Encounter (Signed)
fenofibric acid refilled

## 2013-05-19 ENCOUNTER — Other Ambulatory Visit: Payer: Self-pay | Admitting: Internal Medicine

## 2013-05-19 NOTE — Telephone Encounter (Signed)
Allopurinol refilled per protocol

## 2013-05-30 ENCOUNTER — Other Ambulatory Visit: Payer: Self-pay | Admitting: Cardiovascular Disease

## 2013-06-19 ENCOUNTER — Observation Stay (HOSPITAL_COMMUNITY): Payer: Federal, State, Local not specified - PPO

## 2013-06-19 ENCOUNTER — Emergency Department (HOSPITAL_COMMUNITY): Payer: Federal, State, Local not specified - PPO

## 2013-06-19 ENCOUNTER — Observation Stay (HOSPITAL_COMMUNITY)
Admission: EM | Admit: 2013-06-19 | Discharge: 2013-06-20 | Disposition: A | Discharge status: Home / Self Care | Payer: Federal, State, Local not specified - PPO | Attending: Family Medicine | Admitting: Family Medicine

## 2013-06-19 ENCOUNTER — Encounter (HOSPITAL_COMMUNITY): Payer: Self-pay | Admitting: Emergency Medicine

## 2013-06-19 DIAGNOSIS — I2510 Atherosclerotic heart disease of native coronary artery without angina pectoris: Secondary | ICD-10-CM | POA: Insufficient documentation

## 2013-06-19 DIAGNOSIS — Z79899 Other long term (current) drug therapy: Secondary | ICD-10-CM | POA: Insufficient documentation

## 2013-06-19 DIAGNOSIS — I10 Essential (primary) hypertension: Secondary | ICD-10-CM | POA: Diagnosis present

## 2013-06-19 DIAGNOSIS — L301 Dyshidrosis [pompholyx]: Secondary | ICD-10-CM | POA: Insufficient documentation

## 2013-06-19 DIAGNOSIS — R143 Flatulence: Secondary | ICD-10-CM

## 2013-06-19 DIAGNOSIS — N1330 Unspecified hydronephrosis: Secondary | ICD-10-CM | POA: Insufficient documentation

## 2013-06-19 DIAGNOSIS — Z87891 Personal history of nicotine dependence: Secondary | ICD-10-CM | POA: Insufficient documentation

## 2013-06-19 DIAGNOSIS — Z86010 Personal history of colonic polyps: Secondary | ICD-10-CM

## 2013-06-19 DIAGNOSIS — R933 Abnormal findings on diagnostic imaging of other parts of digestive tract: Secondary | ICD-10-CM

## 2013-06-19 DIAGNOSIS — Z8673 Personal history of transient ischemic attack (TIA), and cerebral infarction without residual deficits: Secondary | ICD-10-CM | POA: Insufficient documentation

## 2013-06-19 DIAGNOSIS — R55 Syncope and collapse: Principal | ICD-10-CM | POA: Diagnosis present

## 2013-06-19 DIAGNOSIS — K219 Gastro-esophageal reflux disease without esophagitis: Secondary | ICD-10-CM

## 2013-06-19 DIAGNOSIS — E1129 Type 2 diabetes mellitus with other diabetic kidney complication: Secondary | ICD-10-CM | POA: Diagnosis present

## 2013-06-19 DIAGNOSIS — G459 Transient cerebral ischemic attack, unspecified: Secondary | ICD-10-CM

## 2013-06-19 DIAGNOSIS — K859 Acute pancreatitis without necrosis or infection, unspecified: Secondary | ICD-10-CM | POA: Insufficient documentation

## 2013-06-19 DIAGNOSIS — R141 Gas pain: Secondary | ICD-10-CM

## 2013-06-19 DIAGNOSIS — E785 Hyperlipidemia, unspecified: Secondary | ICD-10-CM | POA: Insufficient documentation

## 2013-06-19 DIAGNOSIS — N259 Disorder resulting from impaired renal tubular function, unspecified: Secondary | ICD-10-CM | POA: Diagnosis present

## 2013-06-19 DIAGNOSIS — R9389 Abnormal findings on diagnostic imaging of other specified body structures: Secondary | ICD-10-CM

## 2013-06-19 DIAGNOSIS — R739 Hyperglycemia, unspecified: Secondary | ICD-10-CM | POA: Diagnosis present

## 2013-06-19 DIAGNOSIS — K5730 Diverticulosis of large intestine without perforation or abscess without bleeding: Secondary | ICD-10-CM | POA: Insufficient documentation

## 2013-06-19 DIAGNOSIS — E782 Mixed hyperlipidemia: Secondary | ICD-10-CM

## 2013-06-19 DIAGNOSIS — R142 Eructation: Secondary | ICD-10-CM

## 2013-06-19 DIAGNOSIS — R1110 Vomiting, unspecified: Secondary | ICD-10-CM | POA: Insufficient documentation

## 2013-06-19 DIAGNOSIS — Z951 Presence of aortocoronary bypass graft: Secondary | ICD-10-CM | POA: Insufficient documentation

## 2013-06-19 DIAGNOSIS — Z7982 Long term (current) use of aspirin: Secondary | ICD-10-CM | POA: Insufficient documentation

## 2013-06-19 DIAGNOSIS — E1165 Type 2 diabetes mellitus with hyperglycemia: Secondary | ICD-10-CM

## 2013-06-19 DIAGNOSIS — K2980 Duodenitis without bleeding: Secondary | ICD-10-CM | POA: Insufficient documentation

## 2013-06-19 DIAGNOSIS — M109 Gout, unspecified: Secondary | ICD-10-CM | POA: Insufficient documentation

## 2013-06-19 HISTORY — DX: Type 2 diabetes mellitus without complications: E11.9

## 2013-06-19 HISTORY — DX: Syncope and collapse: R55

## 2013-06-19 HISTORY — DX: Pneumonia, unspecified organism: J18.9

## 2013-06-19 LAB — CBC
HCT: 39.3 % (ref 39.0–52.0)
HCT: 39.6 % (ref 39.0–52.0)
Hemoglobin: 13.9 g/dL (ref 13.0–17.0)
Hemoglobin: 14 g/dL (ref 13.0–17.0)
MCH: 30.6 pg (ref 26.0–34.0)
MCH: 30.6 pg (ref 26.0–34.0)
MCHC: 35.4 g/dL (ref 30.0–36.0)
MCHC: 35.4 g/dL (ref 30.0–36.0)
MCV: 86.6 fL (ref 78.0–100.0)
MCV: 86.5 fL (ref 78.0–100.0)
PLATELETS: 181 10*3/uL (ref 150–400)
PLATELETS: 193 10*3/uL (ref 150–400)
RBC: 4.54 MIL/uL (ref 4.22–5.81)
RBC: 4.58 MIL/uL (ref 4.22–5.81)
RDW: 13.7 % (ref 11.5–15.5)
RDW: 13.7 % (ref 11.5–15.5)
WBC: 7.9 10*3/uL (ref 4.0–10.5)
WBC: 7.2 10*3/uL (ref 4.0–10.5)

## 2013-06-19 LAB — POCT I-STAT, CHEM 8
BUN: 28 mg/dL — ABNORMAL HIGH (ref 6–23)
CHLORIDE: 105 meq/L (ref 96–112)
Calcium, Ion: 1.29 mmol/L (ref 1.13–1.30)
Creatinine, Ser: 1.3 mg/dL (ref 0.50–1.35)
Glucose, Bld: 209 mg/dL — ABNORMAL HIGH (ref 70–99)
HCT: 40 % (ref 39.0–52.0)
Hemoglobin: 13.6 g/dL (ref 13.0–17.0)
Potassium: 4.2 mEq/L (ref 3.7–5.3)
SODIUM: 140 meq/L (ref 137–147)
TCO2: 25 mmol/L (ref 0–100)

## 2013-06-19 LAB — BASIC METABOLIC PANEL
BUN: 27 mg/dL — ABNORMAL HIGH (ref 6–23)
CHLORIDE: 104 meq/L (ref 96–112)
CO2: 20 mEq/L (ref 19–32)
Calcium: 9.9 mg/dL (ref 8.4–10.5)
Creatinine, Ser: 1.17 mg/dL (ref 0.50–1.35)
GFR calc non Af Amer: 59 mL/min — ABNORMAL LOW (ref >90)
GFR, EST AFRICAN AMERICAN: 69 mL/min — AB (ref >90)
Glucose, Bld: 205 mg/dL — ABNORMAL HIGH (ref 70–99)
POTASSIUM: 4.4 meq/L (ref 3.7–5.3)
SODIUM: 139 meq/L (ref 137–147)

## 2013-06-19 LAB — HEPATIC FUNCTION PANEL
ALBUMIN: 3.7 g/dL (ref 3.5–5.2)
ALT: 21 U/L (ref 0–53)
AST: 21 U/L (ref 0–37)
Alkaline Phosphatase: 50 U/L (ref 39–117)
BILIRUBIN TOTAL: 0.5 mg/dL (ref 0.3–1.2)
Total Protein: 6.8 g/dL (ref 6.0–8.3)

## 2013-06-19 LAB — MAGNESIUM: MAGNESIUM: 1.9 mg/dL (ref 1.5–2.5)

## 2013-06-19 LAB — LIPID PANEL
CHOLESTEROL: 202 mg/dL — AB (ref 0–200)
HDL: 32 mg/dL — ABNORMAL LOW (ref >39)
LDL Cholesterol: 117 mg/dL — ABNORMAL HIGH (ref 0–99)
TRIGLYCERIDES: 266 mg/dL — AB (ref <150)
Total CHOL/HDL Ratio: 6.3 RATIO
VLDL: 53 mg/dL — ABNORMAL HIGH (ref 0–40)

## 2013-06-19 LAB — GLUCOSE, CAPILLARY
GLUCOSE-CAPILLARY: 267 mg/dL — AB (ref 70–99)
Glucose-Capillary: 196 mg/dL — ABNORMAL HIGH (ref 70–99)

## 2013-06-19 LAB — POCT I-STAT TROPONIN I: TROPONIN I, POC: 0.01 ng/mL (ref 0.00–0.08)

## 2013-06-19 LAB — PROTIME-INR
INR: 1.02 (ref 0.00–1.49)
PROTHROMBIN TIME: 13.2 s (ref 11.6–15.2)

## 2013-06-19 LAB — TROPONIN I

## 2013-06-19 MED ORDER — MELOXICAM 7.5 MG PO TABS
7.5000 mg | ORAL_TABLET | Freq: Two times a day (BID) | ORAL | Status: DC
  Administered 2013-06-20: 7.5 mg via ORAL
  Filled 2013-06-19 (×3): qty 1

## 2013-06-19 MED ORDER — METOPROLOL TARTRATE 50 MG PO TABS
50.0000 mg | ORAL_TABLET | Freq: Two times a day (BID) | ORAL | Status: DC
  Administered 2013-06-19 – 2013-06-20 (×2): 50 mg via ORAL
  Filled 2013-06-19 (×3): qty 1

## 2013-06-19 MED ORDER — ENOXAPARIN SODIUM 40 MG/0.4ML ~~LOC~~ SOLN
40.0000 mg | SUBCUTANEOUS | Status: DC
  Administered 2013-06-19: 40 mg via SUBCUTANEOUS
  Filled 2013-06-19 (×2): qty 0.4

## 2013-06-19 MED ORDER — ALLOPURINOL 100 MG PO TABS: 100.0000 mg | ORAL_TABLET | Freq: Every day | ORAL | Status: DC

## 2013-06-19 MED ORDER — ONDANSETRON HCL 4 MG/2ML IJ SOLN: 4.0000 mg | Freq: Four times a day (QID) | INTRAMUSCULAR | Status: DC | PRN

## 2013-06-19 MED ORDER — SODIUM CHLORIDE 0.9 % IV BOLUS (SEPSIS)
500.0000 mL | Freq: Once | INTRAVENOUS | Status: AC
  Administered 2013-06-19: 500 mL via INTRAVENOUS

## 2013-06-19 MED ORDER — ALLOPURINOL 100 MG PO TABS
100.0000 mg | ORAL_TABLET | Freq: Every day | ORAL | Status: DC
  Filled 2013-06-19 (×2): qty 1

## 2013-06-19 MED ORDER — ASPIRIN EC 81 MG PO TBEC
81.0000 mg | DELAYED_RELEASE_TABLET | Freq: Every day | ORAL | Status: DC
  Administered 2013-06-20: 81 mg via ORAL
  Filled 2013-06-19: qty 1

## 2013-06-19 MED ORDER — ONDANSETRON HCL 4 MG/2ML IJ SOLN
4.0000 mg | Freq: Once | INTRAMUSCULAR | Status: AC
  Administered 2013-06-19: 4 mg via INTRAVENOUS
  Filled 2013-06-19: qty 2

## 2013-06-19 MED ORDER — ACETAMINOPHEN 325 MG PO TABS: 650.0000 mg | ORAL_TABLET | Freq: Four times a day (QID) | ORAL | Status: DC | PRN

## 2013-06-19 MED ORDER — METOPROLOL TARTRATE 50 MG PO TABS
50.0000 mg | ORAL_TABLET | Freq: Two times a day (BID) | ORAL | Status: DC
  Filled 2013-06-19: qty 1

## 2013-06-19 MED ORDER — PRAVASTATIN SODIUM 20 MG PO TABS
20.0000 mg | ORAL_TABLET | Freq: Every day | ORAL | Status: DC
  Filled 2013-06-19 (×2): qty 1

## 2013-06-19 MED ORDER — INSULIN ASPART 100 UNIT/ML ~~LOC~~ SOLN
0.0000 [IU] | Freq: Three times a day (TID) | SUBCUTANEOUS | Status: DC
  Administered 2013-06-20 (×2): 3 [IU] via SUBCUTANEOUS

## 2013-06-19 MED ORDER — FENOFIBRATE 54 MG PO TABS
54.0000 mg | ORAL_TABLET | Freq: Every day | ORAL | Status: DC
  Administered 2013-06-20: 54 mg via ORAL
  Filled 2013-06-19 (×2): qty 1

## 2013-06-19 MED ORDER — ONDANSETRON HCL 4 MG PO TABS: 4.0000 mg | ORAL_TABLET | Freq: Four times a day (QID) | ORAL | Status: DC | PRN

## 2013-06-19 MED ORDER — ACETAMINOPHEN 650 MG RE SUPP: 650.0000 mg | Freq: Four times a day (QID) | RECTAL | Status: DC | PRN

## 2013-06-19 MED ORDER — FINASTERIDE 5 MG PO TABS
5.0000 mg | ORAL_TABLET | Freq: Every day | ORAL | Status: DC
  Administered 2013-06-19 – 2013-06-20 (×2): 5 mg via ORAL
  Filled 2013-06-19 (×4): qty 1

## 2013-06-19 MED ORDER — SIMVASTATIN 5 MG PO TABS: 5.0000 mg | ORAL_TABLET | Freq: Every day | ORAL | Status: DC

## 2013-06-19 NOTE — ED Notes (Addendum)
Juan [name], daugther [phone]. Is leaving will be back shortly.

## 2013-06-19 NOTE — ED Notes (Signed)
Pt reports after exercising he felt lightheaded and then went to sit down. sts he there were lots of people around and helped him to the floor so he didn't hit his head. Nad, skin warm and dry, resp e/u.

## 2013-06-19 NOTE — ED Notes (Signed)
Attempted to call report, Network engineer reports charge RN hasn't assigned bed yet.

## 2013-06-19 NOTE — Consult Note (Signed)
CARDIOLOGY CONSULT NOTE   Patient ID: Juan [name] MRN: [id number], DOB/AGE: 76-May-1937   Admit date: 06/19/2013 Date of Consult: 06/19/2013  Primary Physician: Juan Cobble, MD Primary Cardiologist: P. Johnsie Cancel, MD   Pt. Profile  78 y/o male with h/o CAD s/p CABG who was admitted following a syncopal episode earlier today.  Problem List  Past Medical History  Diagnosis Date  . Hyperlipidemia   . Duodenitis   . Type II diabetes mellitus   . Gout   . Diverticular disease   . Elevated PSA   . Tubular adenoma polyp of rectum   . Pancreatitis     a. ? Simvastatin related  . GERD (gastroesophageal reflux disease)   . Hydronephrosis with ureteropelvic junction obstruction   . Hypertension   . TIA (transient ischemic attack)     a. 01/2012 - presentation as acute imbalance;  b. 01/2012 carotid U/S w/o signif stenosis;  c. 01/2012 Echo: EF 60-65%, Gr 1 DD.  Marland Kitchen Hypercalcemia 02/02/12    a. 01/2012: 10.7.  Marland Kitchen Coronary artery disease     a. s/p CABG 2000; b. myoview 9/08: inf-septal scar with mild peri-infarct ischemia (low risk);  c.  Echo 8/13: mild focal basal septal hypertrophy, Gr 1 DD, mild LAE  . Syncope     a. 06/2013    Past Surgical History  Procedure Laterality Date  . Cochlear implant    . Coronary artery bypass graft  2000    x 5  . Appendectomy    . Cholecystectomy    . Tonsillectomy    . Colonoscopy w/ polypectomy      no polyp 2012  . Parathyroidectomy  1978  . Polypectomy       Allergies  Allergies  Allergen Reactions  . Simvastatin     REACTION:  Possible statin induced pancreatitis    HPI   78 y/o male with h/o CAD s/p CABG in 2000.  He had a negative MV in 2008.  He was in his usoh until this morning, when while at an exercise class, with about 5 mins to go, while sitting and lifting light weights, he developed lightheadedness and diaphoresis.  He was noted by the class instructor to be quite pale and EMS was called.  Upon EMS arrival pt says the he was  feeling better and was apparently cleared to go home.  Unfortunately, he developed recurrent presyncope and diaphoresis and apparently lost consciousness.  He has no recollection of losing consciousness but his next memory is that of being on the gurney and being wheeled toward the ambulance.  During transfer he became nauseated and vomited.  He also lost control of his bowels.  He does not remember getting into the ambulance and regained consciousness only once the ambulance was already driving toward [facility].  Upon arrival to [facility], he again vomited during transfer into the ED.  In the ED, ECG was non-acute and he was hemodynamically stable.  There are no reports of bradycardia/tachycardia or hypotension.  He was admitted by internal medicine and just underwent Head CT, which showed no acute findings.  He is currently symptom free.  Review of tele shows sinus rhythm w/o ectopy or pauses.  He denies chest pain, palpitations, dyspnea, pnd, orthopnea, n, v, edema, weight gain, or early satiety.   Inpatient Medications  . [START ON 06/20/2013] allopurinol  100 mg Oral Daily  . [START ON 06/20/2013] aspirin EC  81 mg Oral Daily  . enoxaparin (LOVENOX) injection  40  mg Subcutaneous Q24H  . fenofibrate  54 mg Oral Daily  . finasteride  5 mg Oral Daily  . insulin aspart  0-15 Units Subcutaneous TID WC  . meloxicam  7.5 mg Oral BID  . metoprolol  50 mg Oral BID  . pravastatin  20 mg Oral Daily    Family History Family History  Problem Relation Age of Onset  . Melanoma Father   . Heart attack Father      in 46s  . Diabetes Neg Hx      Social History History   Social History  . Marital Status: Married    Spouse Name: N/A    Number of Children: N/A  . Years of Education: N/A   Occupational History  . retired    Social History Main Topics  . Smoking status: Former Smoker -- 1 years    Types: Cigars  . Smokeless tobacco: Never Used     Comment: rare cigar  . Alcohol Use: No  . Drug Use: No  .  Sexual Activity: Not on file   Other Topics Concern  . Not on file   Social History Narrative   Does get regular exercise     Review of Systems  General:  No chills, fever, night sweats or weight changes.  Cardiovascular:  +++ presyncope/syncope as outlined above.  No chest pain, dyspnea on exertion, edema, orthopnea, palpitations, paroxysmal nocturnal dyspnea. Dermatological: No rash, lesions/masses Respiratory: No cough, dyspnea Urologic: No hematuria, dysuria Abdominal:   No nausea, vomiting, diarrhea, bright red blood per rectum, melena, or hematemesis Neurologic:  No visual changes, wkns, changes in mental status. All other systems reviewed and are otherwise negative except as noted above.  Physical Exam  Blood pressure 152/87, pulse 78, temperature 98.1 F (36.7 C), temperature source Oral, resp. rate 16, height 5\' 11"  (1.803 m), weight 185 lb (83.915 kg), SpO2 98.00%.  General: Pleasant, NAD Psych: Normal affect. Neuro: Alert and oriented X 3. Moves all extremities spontaneously.  Speech somewhat slurred - chronic r/t hearing deficit. HEENT: Normal  Neck: Supple without bruits or JVD. Lungs:  Resp regular and unlabored, CTA. Heart: RRR no s3, s4, or murmurs. Abdomen: Soft, non-tender, non-distended, BS + x 4.  Extremities: No clubbing, cyanosis or edema. DP/PT/Radials 2+ and equal bilaterally.  Labs   Lab Results  Component Value Date   WBC 7.9 06/19/2013   HGB 13.6 06/19/2013   HCT 40.0 06/19/2013   MCV 86.6 06/19/2013   PLT 181 06/19/2013     Recent Labs Lab 06/19/13 1303 06/19/13 1435  NA 139 140  K 4.4 4.2  CL 104 105  CO2 20  --   BUN 27* 28*  CREATININE 1.17 1.30  CALCIUM 9.9  --   GLUCOSE 205* 209*   Lab Results  Component Value Date   CHOL 186 04/21/2013   HDL 34.40* 04/21/2013   LDLCALC 86 02/02/2012   TRIG 214.0* 04/21/2013   Radiology/Studies  Dg Chest 2 View  06/19/2013   CLINICAL DATA:  Hypertension, loss of consciousness and emesis.  EXAM:  CHEST  2 VIEW   IMPRESSION: Minimal interstitial prominence in the lung bases favors scarring and possible COPD without superimposed acute cardiopulmonary process.   Electronically Signed   By: Juan [name]   On: 06/19/2013 13:36   ECG  Sinus, 67, lat twi.  ASSESSMENT AND PLAN  1.  Syncope:  Given n/v surrounding syncope, suspect vagal response in setting of exercise.  No events on tele up  to this point.  Head CT negative for acute finding.  2D echo currently being performed - previously nl LV fxn.  Given onset of Ss during exercise, we will arrange for an exercise cardiolite to assess rhythm during activity and r/o ischemia as a possible contributor to Ss.  He believes that he will be able to walk on the treadmill.  Cont to follow on tele.  Will consider 21 day event monitor if no findings during hospitalization.  2.  CAD:  S/p CABG. Cardiolite as above.  No recent chest pain or doe.  Cont current meds (except hold bb in am).  3.  HTN:  bp currently elevated.  Follow.  4.  HL:  Cont statin.  5.  DM:  Came off of meds.  Per IM.  Signed, Murray Hodgkins, NP 06/19/2013, 6:34 PM  Patient examined chart reviewed. Patient well known to me from previous office visits.  Episode of presyncope post exercise today sounds more vagal in nature.  No documentation of arrhythmia or bradycardia.  ECG with no acute changes. No chest pain.  Daughter indicates he has been doing well and this was part of his normal work out routine.  Currently no symptoms and no neurologic signs of stroke or TIA.  CT negative and cannot have MRI due to choclear implant.  Will arrange stress myovue for am given distant CABG .  He thinks he can ambulate on treadmill.  Echo being done now.  Carotids done 2013 with no significant disease.  Exam remarkable for unusual speech due to his deafness and chochlear implant .    Jenkins Rouge

## 2013-06-19 NOTE — ED Notes (Signed)
Pt's daughter reports she wants pt admitted because there is nobody at home to watch him incase he has another syncopal episode.

## 2013-06-19 NOTE — ED Notes (Signed)
Phlebotomy at bedside.

## 2013-06-19 NOTE — ED Notes (Signed)
Per EMS - pt went to work out, like he normally does (3x a week). After working out he passed out in the chair for a few seconds, when he woke up the pt was confused. Staff called ems. Upon ems arrival - pt was a&ox4. Then passed out for about 45 seconds, woke up a&ox4. 12 lead unremarkable. Denies cardiac hx. Vomited X 2. Pt became incontinent with ems, having a BM. 18G in left hand. Denies CP/sob. BP 136/86 HR 62. CBG 186.

## 2013-06-19 NOTE — ED Provider Notes (Signed)
CSN: [id number]     Arrival date & time 06/19/13  1228 History   First MD Initiated Contact with Patient 06/19/13 1233     Chief Complaint  Patient presents with  . Loss of Consciousness  . Emesis   (Consider location/radiation/quality/duration/timing/severity/associated sxs/prior Treatment) HPI Comments: 78 year old male presents after a syncopal episode. He was exercising at a facility like he normally does 2-3 times a week and after one of the workouts he became acutely diaphoretic and appeared ill. He almost passed out but was caught. He started to look better but then started to pass out and asked to have a syncopal episode. Patient denies any chest pain or shortness of breath during this time. Denies any weakness or numbness currently. Patient has a history of coronary disease and has had a CABG. The patient did vomit shortly after waking up the second time and did soil itself with his bowels. There was no shaking noted. He has not felt ill recently.   Past Medical History  Diagnosis Date  . Hyperlipidemia   . Duodenitis     PMH of  . Fasting hyperglycemia   . Gout   . Diverticular disease   . Elevated PSA   . Tubular adenoma polyp of rectum   . Pancreatitis     ? Simvastatin related  . GERD (gastroesophageal reflux disease)   . Hydronephrosis with ureteropelvic junction obstruction   . Hypertension   . TIA (transient ischemic attack) 02/02/2012    presentation as acute imbalance;  carotid U/S negative  . Hypercalcemia 02/02/12     10.7  . Coronary artery disease     a. s/p CABG 2000; b. myoview 9/08: inf-septal scar with mild peri-infarct ischemia (low risk);  c.  Echo 8/13: mild focal basal septal hypertrophy, Gr 1 DD, mild LAE   Past Surgical History  Procedure Laterality Date  . Cochlear implant    . Coronary artery bypass graft  2000    x 5  . Appendectomy    . Cholecystectomy    . Tonsillectomy    . Colonoscopy w/ polypectomy      no polyp 2012  .  Parathyroidectomy  1978  . Polypectomy     Family History  Problem Relation Age of Onset  . Melanoma Father   . Heart attack Father      in 7s  . Diabetes Neg Hx    History  Substance Use Topics  . Smoking status: Former Smoker -- 1 years    Types: Cigars  . Smokeless tobacco: Never Used     Comment: rare cigar  . Alcohol Use: No    Review of Systems  Constitutional: Negative for fever and chills.  Respiratory: Negative for shortness of breath.   Cardiovascular: Positive for syncope. Negative for chest pain, palpitations and leg swelling.  Gastrointestinal: Positive for vomiting. Negative for abdominal pain, diarrhea and constipation.  Neurological: Positive for syncope. Negative for weakness and headaches.  All other systems reviewed and are negative.    Allergies  Simvastatin  Home Medications   Current Outpatient Rx  Name  Route  Sig  Dispense  Refill  . allopurinol (ZYLOPRIM) 100 MG tablet      TAKE 1 TABLET BY MOUTH EVERY DAY   30 tablet   3   . aspirin 325 MG tablet   Oral   Take 325 mg by mouth daily.         . Choline Fenofibrate (FENOFIBRIC ACID) 135 MG CPDR  TAKE ONE CAPSULE BY MOUTH EVERY DAY NEED LAB VISIT FOR MORE REFILLS   30 capsule   5   . dutasteride (AVODART) 0.5 MG capsule   Oral   Take 0.5 mg by mouth daily.         . meloxicam (MOBIC) 7.5 MG tablet      1 by mouth twice daily for ACUTE GOUT attacks as needed   30 tablet   0   . metoprolol (LOPRESSOR) 50 MG tablet      TAKE 1 TABLET (50 MG TOTAL) BY MOUTH 2 (TWO) TIMES DAILY.   60 tablet   0   . pravastatin (PRAVACHOL) 20 MG tablet      TAKE 1 TABLET (20 MG TOTAL) BY MOUTH DAILY.   30 tablet   6    BP 151/92  Temp(Src) 97.8 F (36.6 C) (Oral)  Resp 16  Ht 5\' 11"  (1.803 m)  Wt 185 lb (83.915 kg)  BMI 25.81 kg/m2  SpO2 98% Physical Exam  Nursing note and vitals reviewed. Constitutional: He is oriented to person, place, and time. He appears well-developed  and well-nourished.  HENT:  Head: Normocephalic and atraumatic.  Right Ear: External ear normal.  Left Ear: External ear normal.  Nose: Nose normal.  Eyes: Right eye exhibits no discharge. Left eye exhibits no discharge.  Neck: Neck supple.  Cardiovascular: Normal rate, regular rhythm, normal heart sounds and intact distal pulses.   Pulmonary/Chest: Effort normal and breath sounds normal.  Abdominal: Soft. There is no tenderness.  Musculoskeletal: He exhibits no edema.  Neurological: He is alert and oriented to person, place, and time.  Skin: Skin is warm and dry.    ED Course  Procedures (including critical care time) Labs Review Labs Reviewed  POCT I-STAT, CHEM 8 - Abnormal; Notable for the following:    BUN 28 (*)    Glucose, Bld 209 (*)    All other components within normal limits  CBC  BASIC METABOLIC PANEL  POCT I-STAT TROPONIN I   Imaging Review Dg Chest 2 View  06/19/2013   CLINICAL DATA:  Hypertension, loss of consciousness and emesis.  EXAM: CHEST  2 VIEW  COMPARISON:  Chest radiograph August 28, 1998 ; report available and reviewed, images not available at time of study interpretation.  FINDINGS: Cardiac silhouette is unremarkable. Status post median sternotomy for apparent Coronary artery bypass grafting. No pleural effusions or focal consolidations. Increased lung volumes. Minimal bibasilar interstitial prominence. No pneumothorax.  Multiple EKG lines overlie the patient and may obscure subtle underlying pathology. Surgical clips in the abdomen likely reflect cholecystectomy. High-riding humeral heads suggest remote rotator cuff injury .  IMPRESSION: Minimal interstitial prominence in the lung bases favors scarring and possible COPD without superimposed acute cardiopulmonary process.   Electronically Signed   By: Elon Alas   On: 06/19/2013 13:36    EKG Interpretation    Date/Time:  Friday June 19 2013 12:46:46 EST Ventricular Rate:  67 PR Interval:  142 QRS  Duration: 103 QT Interval:  427 QTC Calculation: 451 R Axis:   5 Text Interpretation:  Ectopic atrial rhythm Low voltage, precordial leads Abnormal T, consider ischemia, lateral leads nonspecific changes in inferior leads compared to most recent in 2013 Confirmed by Karista Aispuro  MD, Kaidance Pantoja ([id number]) on 06/19/2013 2:01:03 PM            MDM   1. Syncope    78 year old male with syncope immediately after exertion/exercise. He appears well here, the does have  some borderline impaired T wave abnormalities that are new. Denies any current chest symptoms. Given his coronary disease and exertional syncope I feel the patient needs observation on telemetry with further syncope workup.    Audree CamelScott T Kathaleen Dudziak, MD 06/19/13 1540

## 2013-06-19 NOTE — Progress Notes (Signed)
  Echocardiogram 2D Echocardiogram has been performed.  Donata Clay 06/19/2013, 7:05 PM

## 2013-06-19 NOTE — H&P (Addendum)
[facility] History and Physical  Juan [name] [id number] DOB: [birth year] DOA: 06/19/2013  Referring physician: * PCP: Unice Cobble, MD   Chief Complaint: Syncope  HPI:  78 year old male with a history of CABG in 2000, hypertension, diabetes, presents with a syncopal episode. The patient states that he was unconscious for about 5-10 minutes. He was sitting and pulling weights. He started experiencing nausea and became cold clammy and sweaty. He vomited shortly afterwards. He did not have any chest pain. He does not report any intercurrent illness. The patient does have some slurred speech but states that this is his baseline.  He states that he has nothing cardiology in almost 2 years. He states that he quit taking his diabetes medications as he was told that he is no longer diabetic. His last hemoglobin A1c last year in November was 8.0. Today his CBG is greater than 200. He states that he exercises fairly regularly 3 times a week.  His last carotid Doppler was on 02/03/12 that did not show any significant carotid artery disease  His last 2-D echo was on 02/03/12 that showed an EF of 60-65% with grade 1 diastolic dysfunction    Review of Systems: negative for the following  Constitutional: As in history of present illness HEENT: Denies photophobia, eye pain, redness, hearing loss, ear pain, congestion, sore throat, rhinorrhea, sneezing, mouth sores, trouble swallowing, neck pain, neck stiffness and tinnitus.  Respiratory: Denies SOB, DOE, cough, chest tightness, and wheezing.  Cardiovascular: Denies chest pain, palpitations and leg swelling.  Gastrointestinal: Denies nausea, vomiting, abdominal pain, diarrhea, constipation, blood in stool and abdominal distention.  Genitourinary: Denies dysuria, urgency, frequency, hematuria, flank pain and difficulty urinating.  Musculoskeletal: Denies myalgias, back pain, joint swelling, arthralgias and gait problem.  Skin: Denies pallor, rash  and wound.  Neurological: As in history of present illness  Hematological: Denies adenopathy. Easy bruising, personal or family bleeding history  Psychiatric/Behavioral: Denies suicidal ideation, mood changes, confusion, nervousness, sleep disturbance and agitation       Past Medical History  Diagnosis Date  . Hyperlipidemia   . Duodenitis     PMH of  . Fasting hyperglycemia   . Gout   . Diverticular disease   . Elevated PSA   . Tubular adenoma polyp of rectum   . Pancreatitis     ? Simvastatin related  . GERD (gastroesophageal reflux disease)   . Hydronephrosis with ureteropelvic junction obstruction   . Hypertension   . TIA (transient ischemic attack) 02/02/2012    presentation as acute imbalance;  carotid U/S negative  . Hypercalcemia 02/02/12     10.7  . Coronary artery disease     a. s/p CABG 2000; b. myoview 9/08: inf-septal scar with mild peri-infarct ischemia (low risk);  c.  Echo 8/13: mild focal basal septal hypertrophy, Gr 1 DD, mild LAE     Past Surgical History  Procedure Laterality Date  . Cochlear implant    . Coronary artery bypass graft  2000    x 5  . Appendectomy    . Cholecystectomy    . Tonsillectomy    . Colonoscopy w/ polypectomy      no polyp 2012  . Parathyroidectomy  1978  . Polypectomy        Social History:  reports that he has quit smoking. His smoking use included Cigars. He has never used smokeless tobacco. He reports that he does not drink alcohol or use illicit drugs.     Allergies  Allergen  Reactions  . Simvastatin     REACTION:  Possible statin induced pancreatitis    Family History  Problem Relation Age of Onset  . Melanoma Father   . Heart attack Father      in 7s  . Diabetes Neg Hx      Prior to Admission medications   Medication Sig Start Date End Date Taking? Authorizing Provider  allopurinol (ZYLOPRIM) 100 MG tablet Take 100 mg by mouth daily.   Yes Historical Provider, MD  aspirin EC 81 MG tablet Take 81  mg by mouth daily.   Yes Historical Provider, MD  Choline Fenofibrate 135 MG capsule Take 135 mg by mouth daily.   Yes Historical Provider, MD  finasteride (PROSCAR) 5 MG tablet Take 5 mg by mouth daily.   Yes Historical Provider, MD  meloxicam (MOBIC) 7.5 MG tablet Take 7.5 mg by mouth 2 (two) times daily.   Yes Historical Provider, MD  metoprolol (LOPRESSOR) 50 MG tablet Take 50 mg by mouth 2 (two) times daily.   Yes Historical Provider, MD  pravastatin (PRAVACHOL) 20 MG tablet Take 20 mg by mouth daily.   Yes Historical Provider, MD     Physical Exam: Filed Vitals:   06/19/13 1500 06/19/13 1501 06/19/13 1502 06/19/13 1530  BP: 152/86 150/92 140/86 139/86  Pulse: 71 73 76 77  Temp:      TempSrc:      Resp:    14  Height:      Weight:      SpO2:    97%     Constitutional: Vital signs reviewed. Patient is a well-developed and well-nourished in no acute distress and cooperative with exam. Alert and oriented x3.  Head: Normocephalic and atraumatic  Ear: TM normal bilaterally  Mouth: no erythema or exudates, MMM  Eyes: PERRL, EOMI, conjunctivae normal, No scleral icterus.  Neck: Supple, Trachea midline normal ROM, No JVD, mass, thyromegaly, or carotid bruit present.  Cardiovascular: RRR, S1 normal, S2 normal, no MRG, pulses symmetric and intact bilaterally  Pulmonary/Chest: CTAB, no wheezes, rales, or rhonchi  Abdominal: Soft. Non-tender, non-distended, bowel sounds are normal, no masses, organomegaly, or guarding present.  GU: no CVA tenderness Musculoskeletal: No joint deformities, erythema, or stiffness, ROM full and no nontender Ext: no edema and no cyanosis, pulses palpable bilaterally (DP and PT)  Hematology: no cervical, inginal, or axillary adenopathy.  Neurological: A&O x3, Strenght is normal and symmetric bilaterally, cranial nerve II-XII are grossly intact, no focal motor deficit, sensory intact to light touch bilaterally.  Skin: Warm, dry and intact. No rash, cyanosis, or  clubbing.  Psychiatric: Normal mood and affect. speech and behavior is normal. Judgment and thought content normal. Cognition and memory are normal.       Labs on Admission:    Basic Metabolic Panel:  Recent Labs Lab 06/19/13 1303 06/19/13 1435  NA 139 140  K 4.4 4.2  CL 104 105  CO2 20  --   GLUCOSE 205* 209*  BUN 27* 28*  CREATININE 1.17 1.30  CALCIUM 9.9  --    Liver Function Tests: No results found for this basename: AST, ALT, ALKPHOS, BILITOT, PROT, ALBUMIN,  in the last 168 hours No results found for this basename: LIPASE, AMYLASE,  in the last 168 hours No results found for this basename: AMMONIA,  in the last 168 hours CBC:  Recent Labs Lab 06/19/13 1303 06/19/13 1435  WBC 7.9  --   HGB 13.9 13.6  HCT 39.3 40.0  MCV  86.6  --   PLT 181  --    Cardiac Enzymes: No results found for this basename: CKTOTAL, CKMB, CKMBINDEX, TROPONINI,  in the last 168 hours  BNP (last 3 results) No results found for this basename: PROBNP,  in the last 8760 hours    CBG: No results found for this basename: GLUCAP,  in the last 168 hours  Radiological Exams on Admission: Dg Chest 2 View  06/19/2013   CLINICAL DATA:  Hypertension, loss of consciousness and emesis.  EXAM: CHEST  2 VIEW  COMPARISON:  Chest radiograph August 28, 1998 ; report available and reviewed, images not available at time of study interpretation.  FINDINGS: Cardiac silhouette is unremarkable. Status post median sternotomy for apparent Coronary artery bypass grafting. No pleural effusions or focal consolidations. Increased lung volumes. Minimal bibasilar interstitial prominence. No pneumothorax.  Multiple EKG lines overlie the patient and may obscure subtle underlying pathology. Surgical clips in the abdomen likely reflect cholecystectomy. High-riding humeral heads suggest remote rotator cuff injury .  IMPRESSION: Minimal interstitial prominence in the lung bases favors scarring and possible COPD without  superimposed acute cardiopulmonary process.   Electronically Signed   By: Elon Alas   On: 06/19/2013 13:36    EKG: EKG Interpretation  Date/Time: Friday June 19 2013 12:46:46 EST  Ventricular Rate: 67  PR Interval: 142  QRS Duration: 103  QT Interval: 427  QTC Calculation: 451  R Axis: 5  Text Interpretation: Ectopic atrial rhythm Low voltage, precordial leads Abnormal T   Assessment/Plan Active Problems:   Syncope   Syncope and collapse   Syncope and collapse Likely vasovagal syncope related to exercise. Given the fact of an extensive cardiac history we'll repeat 2-D echo, carotid Doppler Notify [place] cardiology to see if stress test is needed   Coronary artery disease Place on telemetry and cycle cardiac enzymes  Gout continue allopurinol  Hypertension continue Avodart, metoprolol  Dyslipidemia continue statin and fenofibrate   Diabetes Unclear as to why the patient has not been treated We'll check hemoglobin A1c and place patient on sliding scale insulin  Code Status:   full Family Communication: bedside Disposition Plan: Observation   Time spent: 70 mins   [place] Pager [phone]  If 7PM-7AM, please contact night-coverage [email or web address] Password [facility] 06/19/2013, 4:26 PM

## 2013-06-20 ENCOUNTER — Observation Stay (HOSPITAL_COMMUNITY): Payer: Federal, State, Local not specified - PPO

## 2013-06-20 DIAGNOSIS — R7309 Other abnormal glucose: Secondary | ICD-10-CM

## 2013-06-20 DIAGNOSIS — E1129 Type 2 diabetes mellitus with other diabetic kidney complication: Secondary | ICD-10-CM

## 2013-06-20 DIAGNOSIS — R079 Chest pain, unspecified: Secondary | ICD-10-CM

## 2013-06-20 DIAGNOSIS — I10 Essential (primary) hypertension: Secondary | ICD-10-CM

## 2013-06-20 DIAGNOSIS — E1165 Type 2 diabetes mellitus with hyperglycemia: Secondary | ICD-10-CM

## 2013-06-20 DIAGNOSIS — R739 Hyperglycemia, unspecified: Secondary | ICD-10-CM | POA: Diagnosis present

## 2013-06-20 DIAGNOSIS — R55 Syncope and collapse: Secondary | ICD-10-CM

## 2013-06-20 DIAGNOSIS — M109 Gout, unspecified: Secondary | ICD-10-CM

## 2013-06-20 LAB — BASIC METABOLIC PANEL
BUN: 28 mg/dL — AB (ref 6–23)
CHLORIDE: 103 meq/L (ref 96–112)
CO2: 25 mEq/L (ref 19–32)
Calcium: 9.5 mg/dL (ref 8.4–10.5)
Creatinine, Ser: 1.4 mg/dL — ABNORMAL HIGH (ref 0.50–1.35)
GFR calc Af Amer: 55 mL/min — ABNORMAL LOW (ref >90)
GFR calc non Af Amer: 48 mL/min — ABNORMAL LOW (ref >90)
Glucose, Bld: 182 mg/dL — ABNORMAL HIGH (ref 70–99)
Potassium: 3.9 mEq/L (ref 3.7–5.3)
Sodium: 139 mEq/L (ref 137–147)

## 2013-06-20 LAB — CBC
HEMATOCRIT: 37 % — AB (ref 39.0–52.0)
Hemoglobin: 12.9 g/dL — ABNORMAL LOW (ref 13.0–17.0)
MCH: 30.1 pg (ref 26.0–34.0)
MCHC: 34.9 g/dL (ref 30.0–36.0)
MCV: 86.4 fL (ref 78.0–100.0)
Platelets: 178 10*3/uL (ref 150–400)
RBC: 4.28 MIL/uL (ref 4.22–5.81)
RDW: 13.7 % (ref 11.5–15.5)
WBC: 7.6 10*3/uL (ref 4.0–10.5)

## 2013-06-20 LAB — URINALYSIS, ROUTINE W REFLEX MICROSCOPIC
Bilirubin Urine: NEGATIVE
GLUCOSE, UA: 100 mg/dL — AB
Hgb urine dipstick: NEGATIVE
KETONES UR: NEGATIVE mg/dL
LEUKOCYTES UA: NEGATIVE
NITRITE: NEGATIVE
PH: 6 (ref 5.0–8.0)
Protein, ur: NEGATIVE mg/dL
SPECIFIC GRAVITY, URINE: 1.016 (ref 1.005–1.030)
Urobilinogen, UA: 0.2 mg/dL (ref 0.0–1.0)

## 2013-06-20 LAB — HEMOGLOBIN A1C
Hgb A1c MFr Bld: 8.4 % — ABNORMAL HIGH (ref <5.7)
MEAN PLASMA GLUCOSE: 194 mg/dL — AB (ref <117)

## 2013-06-20 LAB — GLUCOSE, CAPILLARY
GLUCOSE-CAPILLARY: 160 mg/dL — AB (ref 70–99)
GLUCOSE-CAPILLARY: 186 mg/dL — AB (ref 70–99)
GLUCOSE-CAPILLARY: 205 mg/dL — AB (ref 70–99)
Glucose-Capillary: 172 mg/dL — ABNORMAL HIGH (ref 70–99)

## 2013-06-20 LAB — TROPONIN I: Troponin I: <0.3 ng/mL (ref <0.30)

## 2013-06-20 LAB — TSH: TSH: 0.82 u[IU]/mL (ref 0.350–4.500)

## 2013-06-20 MED ORDER — RAMIPRIL 1.25 MG PO CAPS
1.2500 mg | ORAL_CAPSULE | Freq: Every day | ORAL | Status: DC
  Administered 2013-06-20: 1.25 mg via ORAL
  Filled 2013-06-20: qty 1

## 2013-06-20 MED ORDER — SODIUM CHLORIDE 0.9 % IV BOLUS (SEPSIS)
500.0000 mL | Freq: Once | INTRAVENOUS | Status: AC
  Administered 2013-06-20: 500 mL via INTRAVENOUS

## 2013-06-20 MED ORDER — SITAGLIPTIN PHOSPHATE 50 MG PO TABS: 50.0000 mg | ORAL_TABLET | Freq: Every day | ORAL | Status: DC

## 2013-06-20 MED ORDER — REGADENOSON 0.4 MG/5ML IV SOLN
INTRAVENOUS | Status: AC
  Administered 2013-06-20: 0.4 mg
  Filled 2013-06-20: qty 5

## 2013-06-20 MED ORDER — PRAVASTATIN SODIUM 40 MG PO TABS: 40.0000 mg | ORAL_TABLET | Freq: Every day | ORAL | Status: DC

## 2013-06-20 MED ORDER — TECHNETIUM TC 99M SESTAMIBI - CARDIOLITE
30.0000 | Freq: Once | INTRAVENOUS | Status: AC | PRN
  Administered 2013-06-20: 30 via INTRAVENOUS

## 2013-06-20 NOTE — Progress Notes (Signed)
Utilization Review completed.  

## 2013-06-20 NOTE — Progress Notes (Signed)
[facility] PROGRESS NOTE  Juan [name] OEV:[id number] DOB: [birth year] DOA: 06/19/2013 PCP: Unice Cobble, MD  Assessment/Plan: Syncope and collapse  Likely vasovagal syncope related to exercise.  Given the fact of an extensive cardiac history we'll repeat 2-D echo, carotid Doppler  Pending Nuclear stress test results [place] cardiology consultative assistance  Coronary artery disease  Place on telemetry and cycled cardiac enzymes negative for acute ischemia  Gout continue allopurinol   Hypertension continue Avodart, metoprolol   Dyslipidemia continue statin and fenofibrate   Diabetes, type 2, uncontrolled as evidenced by an A1c of 8.4% Continuing close BS monitoring and supplemental insulin  Unclear as to why the patient has not been treated outpatient   Code Status: full  Family Communication: bedside  Disposition Plan: pending cardiology recommendations  HPI/Subjective: Pt is hard of hearing but denies chest pain.  No recurrent syncopal episodes reported.    Objective: Filed Vitals:   06/20/13 1040  BP: 141/66  Pulse: 88  Temp:   Resp:    No intake or output data in the 24 hours ending 06/20/13 1306 Filed Weights   06/19/13 1238  Weight: 185 lb (83.915 kg)    Exam:   General:  Hard of hearing, no apparent distress  Cardiovascular: normal s1, s2 sounds  Respiratory: BBS clear  Abdomen: Soft, nondistended, no masses  Musculoskeletal: no cyanosis or clubbing   Data Reviewed: Basic Metabolic Panel:  Recent Labs Lab 06/19/13 1303 06/19/13 1435 06/19/13 1900 06/20/13 0147  NA 139 140  --  139  K 4.4 4.2  --  3.9  CL 104 105  --  103  CO2 20  --   --  25  GLUCOSE 205* 209*  --  182*  BUN 27* 28*  --  28*  CREATININE 1.17 1.30  --  1.40*  CALCIUM 9.9  --   --  9.5  MG  --   --  1.9  --    Liver Function Tests:  Recent Labs Lab 06/19/13 1900  AST 21  ALT 21  ALKPHOS 50  BILITOT 0.5  PROT 6.8  ALBUMIN 3.7   No results found  for this basename: LIPASE, AMYLASE,  in the last 168 hours No results found for this basename: AMMONIA,  in the last 168 hours CBC:  Recent Labs Lab 06/19/13 1303 06/19/13 1435 06/19/13 1900 06/20/13 0147  WBC 7.9  --  7.2 7.6  HGB 13.9 13.6 14.0 12.9*  HCT 39.3 40.0 39.6 37.0*  MCV 86.6  --  86.5 86.4  PLT 181  --  193 178   Cardiac Enzymes:  Recent Labs Lab 06/19/13 1900 06/20/13 0147 06/20/13 0750  TROPONINI <0.30 <0.30 <0.30   BNP (last 3 results) No results found for this basename: PROBNP,  in the last 8760 hours CBG:  Recent Labs Lab 06/19/13 1721 06/19/13 2027 06/20/13 0012 06/20/13 0736 06/20/13 1209  GLUCAP 196* 267* 186* 172* 160*    No results found for this or any previous visit (from the past 240 hour(s)).   Studies: Dg Chest 2 View  06/19/2013   CLINICAL DATA:  Hypertension, loss of consciousness and emesis.  EXAM: CHEST  2 VIEW  COMPARISON:  Chest radiograph August 28, 1998 ; report available and reviewed, images not available at time of study interpretation.  FINDINGS: Cardiac silhouette is unremarkable. Status post median sternotomy for apparent Coronary artery bypass grafting. No pleural effusions or focal consolidations. Increased lung volumes. Minimal bibasilar interstitial prominence. No pneumothorax.  Multiple EKG  lines overlie the patient and may obscure subtle underlying pathology. Surgical clips in the abdomen likely reflect cholecystectomy. High-riding humeral heads suggest remote rotator cuff injury .  IMPRESSION: Minimal interstitial prominence in the lung bases favors scarring and possible COPD without superimposed acute cardiopulmonary process.   Electronically Signed   By: Elon Alas   On: 06/19/2013 13:36   Ct Head Wo Contrast  06/19/2013   CLINICAL DATA:  Hypertension, diabetes with syncopal episode  EXAM: CT HEAD WITHOUT CONTRAST  TECHNIQUE: Contiguous axial images were obtained from the base of the skull through the vertex without  intravenous contrast.  COMPARISON:  02/03/2012  FINDINGS: There is significant beam hardening artifact from the cochlear implant which partially obscures the right frontal and parietal lobes. There is no evidence of mass effect, midline shift or extra-axial fluid collections. There is no evidence of a space-occupying lesion or intracranial hemorrhage. There is no evidence of a cortical-based area of acute infarction. There is periventricular white matter low attenuation likely secondary to microangiopathy. There is generalized cerebral atrophy. There is an old right basal ganglia lacunar infarct.  The ventricles and sulci are appropriate for the patient's age. The basal cisterns are patent.  Visualized portions of the orbits are unremarkable. The visualized portions of the paranasal sinuses and mastoid air cells are unremarkable. There are cerebral vascular atherosclerotic changes present.  The osseous structures are unremarkable. There are postsurgical changes in the region of the right mastoid secondary to cochlear implant placement.  IMPRESSION: There is significant beam hardening artifact from the cochlear implant which partially obscures the right frontal and parietal lobes.  No acute intracranial pathology.   Electronically Signed   By: Kathreen Devoid   On: 06/19/2013 18:17   Nm Myocar Multi W/spect W/wall Motion / Ef  06/20/2013   CLINICAL DATA:  Chest pain prior CABG  EXAM: MYOCARDIAL IMAGING WITH SPECT (REST AND PHARMACOLOGIC-STRESS)  GATED LEFT VENTRICULAR WALL MOTION STUDY  LEFT VENTRICULAR EJECTION FRACTION  TECHNIQUE: Standard myocardial SPECT imaging was performed after resting intravenous injection of 10 mCi Tc-69m sestamibi. Subsequently, intravenous infusion of Lexiscan was performed under the supervision of the Cardiology staff. At peak effect of the drug, 30 mCi Tc-44m sestamibi was injected intravenously and standard myocardial SPECT imaging was performed. Quantitative gated imaging was also  performed to evaluate left ventricular wall motion, and estimate left ventricular ejection fraction.  COMPARISON:  DG CHEST 2 VIEW dated 06/19/2013  FINDINGS: Perfusion: There are large regions of decreased counts within the apical, mid, and basilar segments of the inferior wall and septal wall which are fixed on stress and rest. No evidence reversible ischemia.  Wall Motion: Mild hypokinesia of the inferior wall and septum.  Ejection Fraction: 60 %  End-diastolic volume equals: 80 mL  End systolic volume equals: 32 mL  IMPRESSION:  1. No reversible ischemia. 2. 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 3. Mild hypokinesia involving the inferior wall and septum. 4. Left ventricular ejection fraction equals 60%   Electronically Signed   By: Suzy Bouchard M.D.   On: 06/20/2013 12:08    Scheduled Meds: . allopurinol  100 mg Oral QHS  . aspirin EC  81 mg Oral Daily  . enoxaparin (LOVENOX) injection  40 mg Subcutaneous Q24H  . fenofibrate  54 mg Oral Daily  . finasteride  5 mg Oral Daily  . insulin aspart  0-15 Units Subcutaneous TID WC  . meloxicam  7.5 mg Oral BID  . metoprolol  50  mg Oral BID  . [START ON 06/21/2013] pravastatin  40 mg Oral Daily  . ramipril  1.25 mg Oral Daily  . sodium chloride  500 mL Intravenous Once   Continuous Infusions:   Active Problems:   Syncope   Syncope and collapse   [facility]  [facility] Pager [phone]. If 7PM-7AM, please contact night-coverage at [email or web address], password [facility] 06/20/2013, 1:06 PM  LOS: 1 day

## 2013-06-20 NOTE — Progress Notes (Deleted)
Patient: Juan [name] / Admit Date: 06/19/2013 / Date of Encounter: 06/20/2013, 10:15 AM   Subjective  No CP or SOB at baseline. Attempted to exercise patient but his gait was unsteady at 2nd stage speed on the treadmill so nuc was changed to [employer]. He did have ST depression inferiorly as well as V3-V6. No associated CP but did have SOB.   Objective   Telemetry: Discussed with central telemetry Keisha before putting on the treadmill - NSR, no arrhythmias overnight.  Physical Exam: Blood pressure 150/82, pulse 68, temperature 97.8 F (36.6 C), temperature source Oral, resp. rate 18, height 5\' 11"  (1.803 m), weight 185 lb (83.915 kg), SpO2 98.00%. General: Well developed, well nourished, in no acute distress. Head: Normocephalic, atraumatic, sclera non-icteric, no xanthomas, nares are without discharge. Neck: Negative for carotid bruits. JVP not elevated. Lungs: Clear bilaterally to auscultation without wheezes, rales, or rhonchi. Breathing is unlabored. Heart: RRR S1 S2 without murmurs, rubs, or gallops.  Abdomen: Soft, non-tender, non-distended with normoactive bowel sounds. No rebound/guarding. Extremities: No clubbing or cyanosis. No edema. Distal pedal pulses are 2+ and equal bilaterally. Neuro: Alert and oriented X 3. Moves all extremities spontaneously. Psych:  Responds to questions appropriately with a normal affect. 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 allopurinol  100 mg Oral QHS  . aspirin EC  81 mg Oral Daily  . enoxaparin (LOVENOX) injection  40 mg Subcutaneous Q24H  . fenofibrate  54 mg Oral Daily  . finasteride  5 mg Oral Daily  . insulin aspart  0-15 Units Subcutaneous TID WC  . meloxicam  7.5 mg Oral BID  . metoprolol  50 mg Oral BID  . pravastatin  20 mg Oral Daily  . regadenoson       Infusions:    Labs:  Recent Labs  06/19/13 1303 06/19/13 1435 06/19/13 1900 06/20/13 0147  NA 139 140  --  139  K 4.4 4.2  --  3.9  CL 104 105  --  103  CO2 20  --   --  25  GLUCOSE 205* 209*  --  182*  BUN 27* 28*  --  28*  CREATININE 1.17 1.30  --  1.40*  CALCIUM 9.9  --   --  9.5  MG  --   --  1.9  --     Recent Labs  06/19/13 1900  AST 21  ALT 21  ALKPHOS 50  BILITOT 0.5  PROT 6.8  ALBUMIN 3.7    Recent Labs  06/19/13 1900 06/20/13 0147  WBC 7.2 7.6  HGB 14.0 12.9*  HCT 39.6 37.0*  MCV 86.5 86.4  PLT 193 178    Recent Labs  06/19/13 1900 06/20/13 0147 06/20/13 0750  TROPONINI <0.30 <0.30 <0.30   No components found with this basename: POCBNP,   Recent Labs  06/19/13 1900  HGBA1C 8.4*     Radiology/Studies:  Dg Chest 2 View  06/19/2013   CLINICAL DATA:  Hypertension, loss of consciousness and emesis.  EXAM: CHEST  2 VIEW  COMPARISON:  Chest radiograph August 28, 1998 ; report available and reviewed, images not available at time of study interpretation.  FINDINGS: Cardiac silhouette is unremarkable. Status post median sternotomy for apparent Coronary artery bypass grafting. No pleural effusions or focal consolidations. Increased lung volumes. Minimal bibasilar interstitial prominence. No pneumothorax.  Multiple EKG lines  overlie the patient and may obscure subtle underlying pathology. Surgical clips in the abdomen likely reflect cholecystectomy. High-riding humeral heads suggest remote rotator cuff injury .  IMPRESSION: Minimal interstitial prominence in the lung bases favors scarring and possible COPD without superimposed acute cardiopulmonary process.   Electronically Signed   By: Elon Alas   On: 06/19/2013 13:36   Ct Head Wo Contrast  06/19/2013   CLINICAL DATA:  Hypertension, diabetes with syncopal episode  EXAM: CT HEAD WITHOUT CONTRAST  TECHNIQUE: Contiguous axial images were obtained from the base of the skull through the vertex without intravenous contrast.  COMPARISON:   02/03/2012  FINDINGS: There is significant beam hardening artifact from the cochlear implant which partially obscures the right frontal and parietal lobes. There is no evidence of mass effect, midline shift or extra-axial fluid collections. There is no evidence of a space-occupying lesion or intracranial hemorrhage. There is no evidence of a cortical-based area of acute infarction. There is periventricular white matter low attenuation likely secondary to microangiopathy. There is generalized cerebral atrophy. There is an old right basal ganglia lacunar infarct.  The ventricles and sulci are appropriate for the patient's age. The basal cisterns are patent.  Visualized portions of the orbits are unremarkable. The visualized portions of the paranasal sinuses and mastoid air cells are unremarkable. There are cerebral vascular atherosclerotic changes present.  The osseous structures are unremarkable. There are postsurgical changes in the region of the right mastoid secondary to cochlear implant placement.  IMPRESSION: There is significant beam hardening artifact from the cochlear implant which partially obscures the right frontal and parietal lobes.  No acute intracranial pathology.   Electronically Signed   By: Kathreen Devoid   On: 06/19/2013 18:17     Assessment and Plan  1. Syncope 2. CAD s/p prior CABG 3. HTN 4. HLD 5. DM, uncontrolled, A1C 8.4 6. Acute renal insufficiency  Rounding MD to follow. Will ask him to review initial EKG in setting of syncopal event - initially reported as sinus but ? ectopic atrial rhythm. ST depressions noted with nuc. Nuc results pending.  Signed, Melina Copa PA-C

## 2013-06-20 NOTE — Discharge Summary (Signed)
Physician Discharge Summary  BENTLI MONETT [id number] DOB: February 12, 1936 DOA: 06/19/2013  PCP: Unice Cobble, MD  Admit date: 06/19/2013 Discharge date: 06/20/2013  Recommendations for Outpatient Follow-up:  See your primary care physician before restarting mobic to be sure kidney function is normal Monitor blood sugar once per day and prn Follow up with cardiology as scheduled Recheck BMP on follow up office visit with PCP  Discharge Diagnoses:  Active Problems:   Essential hypertension, benign   Type II or unspecified type diabetes mellitus with renal manifestations, uncontrolled(250.42)   RENAL INSUFFICIENCY   Syncope   Syncope and collapse   Hyperglycemia  Discharge Condition: stable    Diet recommendation: carb modified, heart healthy  Filed Weights   06/19/13 1238  Weight: 185 lb (83.915 kg)    History of present illness:  78 year old male with a history of CABG in 2000, hypertension, diabetes, presents with a syncopal episode. The patient states that he was unconscious for about 5-10 minutes. He was sitting and pulling weights. He started experiencing nausea and became cold clammy and sweaty. He vomited shortly afterwards. He did not have any chest pain. He does not report any intercurrent illness. The patient does have some slurred speech but states that this is his baseline.  He states that he has nothing cardiology in almost 2 years.  He states that he quit taking his diabetes medications as he was told that he is no longer diabetic. His last hemoglobin A1c last year in November was 8.0. Today his CBG is greater than 200. He states that he exercises fairly regularly 3 times a week.  His last carotid Doppler was on 02/03/12 that did not show any significant carotid artery disease  His last 2-D echo was on 02/03/12 that showed an EF of 60-65% with grade 1 diastolic dysfunction  Hospital Course:   Syncope and collapse  Likely vasovagal syncope related to exercise.  Given  the fact of an extensive cardiac history we'll repeat 2-D echo, carotid Doppler  Pending Nuclear stress test results  Appreciate cardiology consultative assistance  SYNCOPE: No clear etiology. Stress myoview today to evaluate for possible ischemic event or exercise induced arrhythmia. 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 No arrhythmias and no new ischemia . Echo EF OK no significant valvular disease.  follow up event monitor arranged by cardiology.  HTN: BP up slightly. CAD: As above. Enzymes negative.   Coronary artery disease  Place on telemetry and cycled cardiac enzymes negative for acute ischemia   Gout continue allopurinol   Hypertension continue Avodart, metoprolol   Dyslipidemia continue statin and fenofibrate   Diabetes, type 2, uncontrolled as evidenced by an A1c of 8.4%  Continuing close BS monitoring and supplemental insulin  Unclear as to why the patient has not been treated outpatient   Code Status: full    Procedures: FINDINGS:  Perfusion: There are large regions of decreased counts within the  apical, mid, and basilar segments of the inferior wall and septal  wall which are fixed on stress and rest. No evidence reversible  ischemia.  Wall Motion: Mild hypokinesia of the inferior wall and septum.  Ejection Fraction: 60 %  End-diastolic volume equals: 80 mL  End systolic volume equals: 32 mL  IMPRESSION:  1. No reversible ischemia.  2. 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  3. Mild hypokinesia involving the inferior wall and septum.  4. Left ventricular ejection fraction equals 60%  Consultations:  Cardiology Dr. Percival Spanish  Discharge Instructions  Discharge Orders  Future Orders Complete By Expires   Diet - low sodium heart healthy  As directed    Comments:     Carb modified   Discharge instructions  As directed    Comments:     Return if symptoms recur, worsen or new problems develop.  Monitor blood sugars at least once per day and report them  to primary care physician Carb modified diet recommended See a dentist and eye care specialist as soon as possible See your primary care physician about restarting mobic if your kidney function has returned to normal.  Use tylenol instead as needed for inflammation and pain.  See your primary care physician about starting diabetes education classes   Discontinue IV  As directed    Increase activity slowly  As directed        Medication List    STOP taking these medications       meloxicam 7.5 MG tablet  Commonly known as:  MOBIC      TAKE these medications       allopurinol 100 MG tablet  Commonly known as:  ZYLOPRIM  Take 100 mg by mouth daily.     aspirin EC 81 MG tablet  Take 81 mg by mouth daily.     Choline Fenofibrate 135 MG capsule  Take 135 mg by mouth daily.     finasteride 5 MG tablet  Commonly known as:  PROSCAR  Take 5 mg by mouth daily.     metoprolol 50 MG tablet  Commonly known as:  LOPRESSOR  Take 50 mg by mouth 2 (two) times daily.     pravastatin 20 MG tablet  Commonly known as:  PRAVACHOL  Take 20 mg by mouth daily.     sitaGLIPtin 50 MG tablet  Commonly known as:  JANUVIA  Take 1 tablet (50 mg total) by mouth daily.       Allergies  Allergen Reactions  . Simvastatin     REACTION:  Possible statin induced pancreatitis       Follow-up Information   Follow up with Unice Cobble, MD. Schedule an appointment as soon as possible for a visit in 1 week. [facility] Follow Up )    Specialty:  Internal Medicine   Contact information:   [phone] [facility] [place] [address] [phone]       Follow up with Jenkins Rouge, MD. Schedule an appointment as soon as possible for a visit in 1 week. [facility] Followup )    Specialty:  Cardiology   Contact information:   [address] [place] [address] [phone]        The results of significant diagnostics from this hospitalization (including  imaging, microbiology, ancillary and laboratory) are listed below for reference.    Significant Diagnostic Studies: Dg Chest 2 View  06/19/2013   CLINICAL DATA:  Hypertension, loss of consciousness and emesis.  EXAM: CHEST  2 VIEW  COMPARISON:  Chest radiograph August 28, 1998 ; report available and reviewed, images not available at time of study interpretation.  FINDINGS: Cardiac silhouette is unremarkable. Status post median sternotomy for apparent Coronary artery bypass grafting. No pleural effusions or focal consolidations. Increased lung volumes. Minimal bibasilar interstitial prominence. No pneumothorax.  Multiple EKG lines overlie the patient and may obscure subtle underlying pathology. Surgical clips in the abdomen likely reflect cholecystectomy. High-riding humeral heads suggest remote rotator cuff injury .  IMPRESSION: Minimal interstitial prominence in the lung bases favors scarring  and possible COPD without superimposed acute cardiopulmonary process.   Electronically Signed   By: Elon Alas   On: 06/19/2013 13:36   Ct Head Wo Contrast  06/19/2013   CLINICAL DATA:  Hypertension, diabetes with syncopal episode  EXAM: CT HEAD WITHOUT CONTRAST  TECHNIQUE: Contiguous axial images were obtained from the base of the skull through the vertex without intravenous contrast.  COMPARISON:  02/03/2012  FINDINGS: There is significant beam hardening artifact from the cochlear implant which partially obscures the right frontal and parietal lobes. There is no evidence of mass effect, midline shift or extra-axial fluid collections. There is no evidence of a space-occupying lesion or intracranial hemorrhage. There is no evidence of a cortical-based area of acute infarction. There is periventricular white matter low attenuation likely secondary to microangiopathy. There is generalized cerebral atrophy. There is an old right basal ganglia lacunar infarct.  The ventricles and sulci are appropriate for the patient's  age. The basal cisterns are patent.  Visualized portions of the orbits are unremarkable. The visualized portions of the paranasal sinuses and mastoid air cells are unremarkable. There are cerebral vascular atherosclerotic changes present.  The osseous structures are unremarkable. There are postsurgical changes in the region of the right mastoid secondary to cochlear implant placement.  IMPRESSION: There is significant beam hardening artifact from the cochlear implant which partially obscures the right frontal and parietal lobes.  No acute intracranial pathology.   Electronically Signed   By: Kathreen Devoid   On: 06/19/2013 18:17   Nm Myocar Multi W/spect W/wall Motion / Ef  06/20/2013   CLINICAL DATA:  Chest pain prior CABG  EXAM: MYOCARDIAL IMAGING WITH SPECT (REST AND PHARMACOLOGIC-STRESS)  GATED LEFT VENTRICULAR WALL MOTION STUDY  LEFT VENTRICULAR EJECTION FRACTION  TECHNIQUE: Standard myocardial SPECT imaging was performed after resting intravenous injection of 10 mCi Tc-53m sestamibi. Subsequently, intravenous infusion of Lexiscan was performed under the supervision of the Cardiology staff. At peak effect of the drug, 30 mCi Tc-66m sestamibi was injected intravenously and standard myocardial SPECT imaging was performed. Quantitative gated imaging was also performed to evaluate left ventricular wall motion, and estimate left ventricular ejection fraction.  COMPARISON:  DG CHEST 2 VIEW dated 06/19/2013  FINDINGS: Perfusion: There are large regions of decreased counts within the apical, mid, and basilar segments of the inferior wall and septal wall which are fixed on stress and rest. No evidence reversible ischemia.  Wall Motion: Mild hypokinesia of the inferior wall and septum.  Ejection Fraction: 60 %  End-diastolic volume equals: 80 mL  End systolic volume equals: 32 mL  IMPRESSION:  1. No reversible ischemia. 2. 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 3. Mild hypokinesia involving the inferior  wall and septum. 4. Left ventricular ejection fraction equals 60%   Electronically Signed   By: Suzy Bouchard M.D.   On: 06/20/2013 12:08    Microbiology: No results found for this or any previous visit (from the past 240 hour(s)).   Labs: Basic Metabolic Panel:  Recent Labs Lab 06/19/13 1303 06/19/13 1435 06/19/13 1900 06/20/13 0147  NA 139 140  --  139  K 4.4 4.2  --  3.9  CL 104 105  --  103  CO2 20  --   --  25  GLUCOSE 205* 209*  --  182*  BUN 27* 28*  --  28*  CREATININE 1.17 1.30  --  1.40*  CALCIUM 9.9  --   --  9.5  MG  --   --  1.9  --    Liver Function Tests:  Recent Labs Lab 06/19/13 1900  AST 21  ALT 21  ALKPHOS 50  BILITOT 0.5  PROT 6.8  ALBUMIN 3.7   No results found for this basename: LIPASE, AMYLASE,  in the last 168 hours No results found for this basename: AMMONIA,  in the last 168 hours CBC:  Recent Labs Lab 06/19/13 1303 06/19/13 1435 06/19/13 1900 06/20/13 0147  WBC 7.9  --  7.2 7.6  HGB 13.9 13.6 14.0 12.9*  HCT 39.3 40.0 39.6 37.0*  MCV 86.6  --  86.5 86.4  PLT 181  --  193 178   Cardiac Enzymes:  Recent Labs Lab 06/19/13 1900 06/20/13 0147 06/20/13 0750  TROPONINI <0.30 <0.30 <0.30   BNP: BNP (last 3 results) No results found for this basename: PROBNP,  in the last 8760 hours CBG:  Recent Labs Lab 06/19/13 1721 06/19/13 2027 06/20/13 0012 06/20/13 0736 06/20/13 1209  GLUCAP 196* 267* 186* 172* 160*   Signed:  Clanford Johnson  [facility] 06/20/2013, 3:51 PM

## 2013-06-20 NOTE — Progress Notes (Signed)
SUBJECTIVE:  No further complaints   PHYSICAL EXAM Filed Vitals:   06/19/13 2100 06/20/13 0353 06/20/13 1022 06/20/13 1034  BP: 156/79 150/82 131/82 152/89  Pulse: 82 68 86 85  Temp: 97.9 F (36.6 C) 97.8 F (36.6 C)    TempSrc:      Resp: 18 18    Height:      Weight:      SpO2: 96% 98%     General:  No distress Lungs:  Clear Heart:  RRR Abdomen:  Positive bowel sounds, no rebound no guarding Extremities:  No edema  LABS: Lab Results  Component Value Date   TROPONINI <0.30 06/20/2013   Results for orders placed during the hospital encounter of 06/19/13 (from the past 24 hour(s))  CBC     Status: None   Collection Time    06/19/13  1:03 PM      Result Value Range   WBC 7.9  4.0 - 10.5 K/uL   RBC 4.54  4.22 - 5.81 MIL/uL   Hemoglobin 13.9  13.0 - 17.0 g/dL   HCT 39.3  39.0 - 52.0 %   MCV 86.6  78.0 - 100.0 fL   MCH 30.6  26.0 - 34.0 pg   MCHC 35.4  30.0 - 36.0 g/dL   RDW 13.7  11.5 - 15.5 %   Platelets 181  150 - 400 K/uL  BASIC METABOLIC PANEL     Status: Abnormal   Collection Time    06/19/13  1:03 PM      Result Value Range   Sodium 139  137 - 147 mEq/L   Potassium 4.4  3.7 - 5.3 mEq/L   Chloride 104  96 - 112 mEq/L   CO2 20  19 - 32 mEq/L   Glucose, Bld 205 (*) 70 - 99 mg/dL   BUN 27 (*) 6 - 23 mg/dL   Creatinine, Ser 1.17  0.50 - 1.35 mg/dL   Calcium 9.9  8.4 - 10.5 mg/dL   GFR calc non Af Amer 59 (*) >90 mL/min   GFR calc Af Amer 69 (*) >90 mL/min  POCT I-STAT, CHEM 8     Status: Abnormal   Collection Time    06/19/13  2:35 PM      Result Value Range   Sodium 140  137 - 147 mEq/L   Potassium 4.2  3.7 - 5.3 mEq/L   Chloride 105  96 - 112 mEq/L   BUN 28 (*) 6 - 23 mg/dL   Creatinine, Ser 1.30  0.50 - 1.35 mg/dL   Glucose, Bld 209 (*) 70 - 99 mg/dL   Calcium, Ion 1.29  1.13 - 1.30 mmol/L   TCO2 25  0 - 100 mmol/L   Hemoglobin 13.6  13.0 - 17.0 g/dL   HCT 40.0  39.0 - 52.0 %  POCT I-STAT TROPONIN I     Status: None   Collection Time   06/19/13  2:39 PM      Result Value Range   Troponin i, poc 0.01  0.00 - 0.08 ng/mL   Comment 3           GLUCOSE, CAPILLARY     Status: Abnormal   Collection Time    06/19/13  5:21 PM      Result Value Range   Glucose-Capillary 196 (*) 70 - 99 mg/dL  CBC     Status: None   Collection Time    06/19/13  7:00 PM  Result Value Range   WBC 7.2  4.0 - 10.5 K/uL   RBC 4.58  4.22 - 5.81 MIL/uL   Hemoglobin 14.0  13.0 - 17.0 g/dL   HCT 40.939.6  81.139.0 - 91.452.0 %   MCV 86.5  78.0 - 100.0 fL   MCH 30.6  26.0 - 34.0 pg   MCHC 35.4  30.0 - 36.0 g/dL   RDW 78.213.7  95.611.5 - 21.315.5 %   Platelets 193  150 - 400 K/uL  HEPATIC FUNCTION PANEL     Status: None   Collection Time    06/19/13  7:00 PM      Result Value Range   Total Protein 6.8  6.0 - 8.3 g/dL   Albumin 3.7  3.5 - 5.2 g/dL   AST 21  0 - 37 U/L   ALT 21  0 - 53 U/L   Alkaline Phosphatase 50  39 - 117 U/L   Total Bilirubin 0.5  0.3 - 1.2 mg/dL   Bilirubin, Direct <0.8<0.2  0.0 - 0.3 mg/dL   Indirect Bilirubin NOT CALCULATED  0.3 - 0.9 mg/dL  MAGNESIUM     Status: None   Collection Time    06/19/13  7:00 PM      Result Value Range   Magnesium 1.9  1.5 - 2.5 mg/dL  TSH     Status: None   Collection Time    06/19/13  7:00 PM      Result Value Range   TSH 0.820  0.350 - 4.500 uIU/mL  PROTIME-INR     Status: None   Collection Time    06/19/13  7:00 PM      Result Value Range   Prothrombin Time 13.2  11.6 - 15.2 seconds   INR 1.02  0.00 - 1.49  TROPONIN I     Status: None   Collection Time    06/19/13  7:00 PM      Result Value Range   Troponin I <0.30  <0.30 ng/mL  HEMOGLOBIN A1C     Status: Abnormal   Collection Time    06/19/13  7:00 PM      Result Value Range   Hemoglobin A1C 8.4 (*) <5.7 %   Mean Plasma Glucose 194 (*) <117 mg/dL  LIPID PANEL     Status: Abnormal   Collection Time    06/19/13  7:00 PM      Result Value Range   Cholesterol 202 (*) 0 - 200 mg/dL   Triglycerides 657266 (*) <150 mg/dL   HDL 32 (*) >84>39 mg/dL    Total CHOL/HDL Ratio 6.3     VLDL 53 (*) 0 - 40 mg/dL   LDL Cholesterol 696117 (*) 0 - 99 mg/dL  GLUCOSE, CAPILLARY     Status: Abnormal   Collection Time    06/19/13  8:27 PM      Result Value Range   Glucose-Capillary 267 (*) 70 - 99 mg/dL  GLUCOSE, CAPILLARY     Status: Abnormal   Collection Time    06/20/13 12:12 AM      Result Value Range   Glucose-Capillary 186 (*) 70 - 99 mg/dL  TROPONIN I     Status: None   Collection Time    06/20/13  1:47 AM      Result Value Range   Troponin I <0.30  <0.30 ng/mL  BASIC METABOLIC PANEL     Status: Abnormal   Collection Time    06/20/13  1:47 AM  Result Value Range   Sodium 139  137 - 147 mEq/L   Potassium 3.9  3.7 - 5.3 mEq/L   Chloride 103  96 - 112 mEq/L   CO2 25  19 - 32 mEq/L   Glucose, Bld 182 (*) 70 - 99 mg/dL   BUN 28 (*) 6 - 23 mg/dL   Creatinine, Ser 1.40 (*) 0.50 - 1.35 mg/dL   Calcium 9.5  8.4 - 10.5 mg/dL   GFR calc non Af Amer 48 (*) >90 mL/min   GFR calc Af Amer 55 (*) >90 mL/min  CBC     Status: Abnormal   Collection Time    06/20/13  1:47 AM      Result Value Range   WBC 7.6  4.0 - 10.5 K/uL   RBC 4.28  4.22 - 5.81 MIL/uL   Hemoglobin 12.9 (*) 13.0 - 17.0 g/dL   HCT 37.0 (*) 39.0 - 52.0 %   MCV 86.4  78.0 - 100.0 fL   MCH 30.1  26.0 - 34.0 pg   MCHC 34.9  30.0 - 36.0 g/dL   RDW 13.7  11.5 - 15.5 %   Platelets 178  150 - 400 K/uL  GLUCOSE, CAPILLARY     Status: Abnormal   Collection Time    06/20/13  7:36 AM      Result Value Range   Glucose-Capillary 172 (*) 70 - 99 mg/dL  URINALYSIS, ROUTINE W REFLEX MICROSCOPIC     Status: Abnormal   Collection Time    06/20/13  7:44 AM      Result Value Range   Color, Urine YELLOW  YELLOW   APPearance CLOUDY (*) CLEAR   Specific Gravity, Urine 1.016  1.005 - 1.030   pH 6.0  5.0 - 8.0   Glucose, UA 100 (*) NEGATIVE mg/dL   Hgb urine dipstick NEGATIVE  NEGATIVE   Bilirubin Urine NEGATIVE  NEGATIVE   Ketones, ur NEGATIVE  NEGATIVE mg/dL   Protein, ur NEGATIVE   NEGATIVE mg/dL   Urobilinogen, UA 0.2  0.0 - 1.0 mg/dL   Nitrite NEGATIVE  NEGATIVE   Leukocytes, UA NEGATIVE  NEGATIVE  TROPONIN I     Status: None   Collection Time    06/20/13  7:50 AM      Result Value Range   Troponin I <0.30  <0.30 ng/mL   No intake or output data in the 24 hours ending 06/20/13 1035  Telemetry:    No arrhythmias  ASSESSMENT AND PLAN:  SYNCOPE:  No clear etiology.  Stress myoview today to evaluate for possible ischemic event or exercise induced arrhythmia.  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  No arrhythmias and no new ischemia Results pending.   Echo EF OK no significant valvular disease.   We will arrange follow up event monitor.    HTN:  BP up slightly.  Continue current therapy.   CAD:  As above.  Enzymes negative.     Juan [name] 06/20/2013 10:35 AM

## 2013-06-20 NOTE — Discharge Instructions (Signed)
Cholesterol Cholesterol is a type of fat. Your body needs a small amount of cholesterol, but too much can cause health problems. Certain problems include heart attacks, strokes, and not enough blood flow to your heart, brain, kidneys, or feet. You get cholesterol in 2 ways:  Naturally.  By eating certain foods. HOME CARE  Eat a low-fat diet:  Eat less eggs, whole dairy products (whole milk, cheese, and butter), fatty meats, and fried foods.  Eat more fruits, vegetables, whole-wheat breads, lean chicken, and fish.  Follow your exercise program as told by your doctor.  Keep your weight at a healthy level. Talk to your doctor about what is right for you.  Only take medicine as told by your doctor.  Get your cholesterol checked once a year or as told by your doctor. MAKE SURE YOU:  Understand these instructions.  Will watch your condition.  Will get help right away if you are not doing well or get worse. Document Released: 08/31/2008 Document Revised: 08/27/2011 Document Reviewed: 08/31/2008 [facility] Patient Information 2014 [place], [place]. Diabetes and Foot Care Diabetes may cause you to have problems because of poor blood supply (circulation) to your feet and legs. This may cause the skin on your feet to become thinner, break easier, and heal more slowly. Your skin may become dry, and the skin may peel and crack. You may also have nerve damage in your legs and feet causing decreased feeling in them. You may not notice minor injuries to your feet that could lead to infections or more serious problems. Taking care of your feet is one of the most important things you can do for yourself.  HOME CARE INSTRUCTIONS  Wear shoes at all times, even in the house. Do not go barefoot. Bare feet are easily injured.  Check your feet daily for blisters, cuts, and redness. If you cannot see the bottom of your feet, use a mirror or ask someone for help.  Wash your feet with warm water (do not use  hot water) and mild soap. Then pat your feet and the areas between your toes until they are completely dry. Do not soak your feet as this can dry your skin.  Apply a moisturizing lotion or petroleum jelly (that does not contain alcohol and is unscented) to the skin on your feet and to dry, brittle toenails. Do not apply lotion between your toes.  Trim your toenails straight across. Do not dig under them or around the cuticle. File the edges of your nails with an emery board or nail file.  Do not cut corns or calluses or try to remove them with medicine.  Wear clean socks or stockings every day. Make sure they are not too tight. Do not wear knee-high stockings since they may decrease blood flow to your legs.  Wear shoes that fit properly and have enough cushioning. To break in new shoes, wear them for just a few hours a day. This prevents you from injuring your feet. Always look in your shoes before you put them on to be sure there are no objects inside.  Do not cross your legs. This may decrease the blood flow to your feet.  If you find a minor scrape, cut, or break in the skin on your feet, keep it and the skin around it clean and dry. These areas may be cleansed with mild soap and water. Do not cleanse the area with peroxide, alcohol, or iodine.  When you remove an adhesive bandage, be sure not  to damage the skin around it.  If you have a wound, look at it several times a day to make sure it is healing.  Do not use heating pads or hot water bottles. They may burn your skin. If you have lost feeling in your feet or legs, you may not know it is happening until it is too late.  Make sure your health care provider performs a complete foot exam at least annually or more often if you have foot problems. Report any cuts, sores, or bruises to your health care provider immediately. SEEK MEDICAL CARE IF:   You have an injury that is not healing.  You have cuts or breaks in the skin.  You have an  ingrown nail.  You notice redness on your legs or feet.  You feel burning or tingling in your legs or feet.  You have pain or cramps in your legs and feet.  Your legs or feet are numb.  Your feet always feel cold. SEEK IMMEDIATE MEDICAL CARE IF:   There is increasing redness, swelling, or pain in or around a wound.  There is a red line that goes up your leg.  Pus is coming from a wound.  You develop a fever or as directed by your health care provider.  You notice a bad smell coming from an ulcer or wound. Document Released: 06/01/2000 Document Revised: 02/04/2013 Document Reviewed: 11/11/2012 [facility] Patient Information 2014 [place]. Blood Glucose Monitoring, Adult Monitoring your blood glucose (also know as blood sugar) helps you to manage your diabetes. It also helps you and your health care provider monitor your diabetes and determine how well your treatment plan is working. WHY SHOULD YOU MONITOR YOUR BLOOD GLUCOSE?  It can help you understand how food, exercise, and medicine affect your blood glucose.  It allows you to know what your blood glucose is at any given moment. You can quickly tell if you are having low blood glucose (hypoglycemia) or high blood glucose (hyperglycemia).  It can help you and your health care provider know how to adjust your medicines.  It can help you understand how to manage an illness or adjust medicine for exercise. WHEN SHOULD YOU TEST? Your health care provider will help you decide how often you should check your blood glucose. This may depend on the type of diabetes you have, your diabetes control, or the types of medicines you are taking. Be sure to write down all of your blood glucose readings so that this information can be reviewed with your health care provider. See below for examples of testing times that your health care provider may suggest. Type 1 Diabetes  Test 4 times a day if you are in good control, using an insulin  pump, or perform multiple daily injections.  If your diabetes is not well-controlled or if you are sick, you may need to monitor more often.  It is a good idea to also monitor:  Before and after exercise.  Between meals and 2 hours after a meal.  Occasionally between 2:00 to 3:00 am. Type 2 Diabetes  It can vary with each person, but generally, if you are on insulin, test 4 times a day.  If you take medicines by mouth (orally), test 2 times a day.  If you are on a controlled diet, test once a day.  If your diabetes is not well controlled or if you are sick, you may need to monitor more often. HOW TO MONITOR YOUR BLOOD GLUCOSE Supplies  Needed  Blood glucose meter.  Test strips for your meter. Each meter has its own strips. You must use the strips that go with your own meter.  A pricking needle (lancet).  A device that holds the lancet (lancing device).  A journal or log book to write down your results. Procedure  Wash your hands with soap and water. Alcohol is not preferred.  Prick the side of your finger (not the tip) with the lancet.  Gently milk the finger until a small drop of blood appears.  Follow the instructions that come with your meter for inserting the test strip, applying blood to the strip, and using your blood glucose meter. Other Areas to Get Blood for Testing Some meters allow you to use other areas of your body (other than your finger) to test your blood. These areas are called alternative sites. The most common alternative sites are:  The forearm.  The thigh.  The back area of the lower leg.  The palm of the hand. The blood flow in these areas is slower. Therefore, the blood glucose values you get may be delayed, and the numbers are different from what you would get from your fingers. Do not use alternative sites if you think you are having hypoglycemia. Your reading will not be accurate. Always use a finger if you are having hypoglycemia. Also, if  you cannot feel your lows (hypoglycemia unawareness), always use your fingers for your blood glucose checks. ADDITIONAL TIPS FOR GLUCOSE MONITORING  Do not reuse lancets.  Always carry your supplies with you.  All blood glucose meters have a 24-hour "hotline" number to call if you have questions or need help.  Adjust (calibrate) your blood glucose meter with a control solution after finishing a few boxes of strips. BLOOD GLUCOSE RECORD KEEPING It is a good idea to keep a daily record or log of your blood glucose readings. Most glucose meters, if not all, keep your glucose records stored in the meter. Some meters come with the ability to download your records to your home computer. Keeping a record of your blood glucose readings is especially helpful if you are wanting to look for patterns. Make notes to go along with the blood glucose readings because you might forget what happened at that exact time. Keeping good records helps you and your health care provider to work together to achieve good diabetes management.  Document Released: 06/07/2003 Document Revised: 02/04/2013 Document Reviewed: 10/27/2012 [facility] Patient Information 2014 [place].  Diabetes and Standards of Medical Care  Diabetes is complicated. You may find that your diabetes team includes a dietitian, nurse, diabetes educator, eye doctor, and more. To help everyone know what is going on and to help you get the care you deserve, the following schedule of care was developed to help keep you on track. Below are the tests, exams, vaccines, medicines, education, and plans you will need. HbA1c test This test shows how well you have controlled your glucose over the past 2 3 months. It is used to see if your diabetes management plan needs to be adjusted.   It is performed at least 2 times a year if you are meeting treatment goals.  It is performed 4 times a year if therapy has changed or if you are not meeting treatment  goals. Blood pressure test  This test is performed at every routine medical visit. The goal is less than 140/90 mmHg for most people, but 130/80 mmHg in some cases. Ask your health  care provider about your goal. Dental exam  Follow up with the dentist regularly. Eye exam  If you are diagnosed with type 1 diabetes as a child, get an exam upon reaching the age of 15 years or older and have had diabetes for 3 5 years. Yearly eye exams are recommended after that initial eye exam.  If you are diagnosed with type 1 diabetes as an adult, get an exam within 5 years of diagnosis and then yearly.  If you are diagnosed with type 2 diabetes, get an exam as soon as possible after the diagnosis and then yearly. Foot care exam  Visual foot exams are performed at every routine medical visit. The exams check for cuts, injuries, or other problems with the feet.  A comprehensive foot exam should be done yearly. This includes visual inspection as well as assessing foot pulses and testing for loss of sensation.  Check your feet nightly for cuts, injuries, or other problems with your feet. Tell your health care provider if anything is not healing. Kidney function test (urine microalbumin)  This test is performed once a year.  Type 1 diabetes: The first test is performed 5 years after diagnosis.  Type 2 diabetes: The first test is performed at the time of diagnosis.  A serum creatinine and estimated glomerular filtration rate (eGFR) test is done once a year to assess the level of chronic kidney disease (CKD), if present. Lipid profile (cholesterol, HDL, LDL, triglycerides)  Performed every 5 years for most people.  The goal for LDL is less than 100 mg/dL. If you are at high risk, the goal is less than 70 mg/dL.  The goal for HDL is 40 mg/dL 50 mg/dL for men and 50 mg/dL 60 mg/dL for women. An HDL cholesterol of 60 mg/dL or higher gives some protection against heart disease.  The goal for triglycerides  is less than 150 mg/dL. Influenza vaccine, pneumococcal vaccine, and hepatitis B vaccine  The influenza vaccine is recommended yearly.  The pneumococcal vaccine is generally given once in a lifetime. However, there are some instances when another vaccination is recommended. Check with your health care provider.  The hepatitis B vaccine is also recommended for adults with diabetes. Diabetes self-management education  Education is recommended at diagnosis and ongoing as needed. Treatment plan  Your treatment plan is reviewed at every medical visit. Document Released: 04/01/2009 Document Revised: 02/04/2013 Document Reviewed: 11/04/2012 [facility] Patient Information 2014 [place].  Chest Pain Observation It is often hard to give a specific diagnosis for the cause of chest pain. Among other possibilities your symptoms might be caused by inadequate oxygen delivery to your heart (angina). Angina that is not treated or evaluated can lead to a heart attack (myocardial infarction) or death. Blood tests, electrocardiograms, and X-rays may have been done to help determine a possible cause of your chest pain. After evaluation and observation, your health care provider has determined that it is unlikely your pain was caused by an unstable condition that requires hospitalization. However, a full evaluation of your pain may need to be completed, with additional diagnostic testing as directed. It is very important to keep your follow-up appointments. Not keeping your follow-up appointments could result in permanent heart damage, disability, or death. If there is any problem keeping your follow-up appointments, you must call your health care provider. HOME CARE INSTRUCTIONS  Due to the slight chance that your pain could be angina, it is important to follow your health care provider's treatment plan and  also maintain a healthy lifestyle:  Maintain or work toward achieving a healthy weight.  Stay  physically active and exercise regularly.  Decrease your salt intake.  Eat a balanced, healthy diet. Talk to a dietician to learn about heart healthy foods.  Increase your fiber intake by including whole grains, vegetables, fruits, and nuts in your diet.  Avoid situations that cause stress, anger, or depression.  Take medicines as advised by your health care provider. Report any side effects to your health care provider. Do not stop medicines or adjust the dosages on your own.  Quit smoking. Do not use nicotine patches or gum until you check with your health care provider.  Keep your blood pressure, blood sugar, and cholesterol levels within normal limits.  Limit alcohol intake to no more than 1 drink per day for women that are not pregnant and 2 drinks per day for men.  Do not abuse drugs. SEEK IMMEDIATE MEDICAL CARE IF: You have severe chest pain or pressure which may include symptoms such as:  You feel pain or pressure in you arms, neck, jaw, or back.  You have severe back or abdominal pain, feel sick to your stomach (nauseous), or throw up (vomit).  You are sweating profusely.  You are having a fast or irregular heartbeat.  You feel short of breath while at rest.  You notice increasing shortness of breath during rest, sleep, or with activity.  You have chest pain that does not get better after rest or after taking your usual medicine.  You wake from sleep with chest pain.  You are unable to sleep because you cannot breathe.  You develop a frequent cough or you are coughing up blood.  You feel dizzy, faint, or experience extreme fatigue.  You develop severe weakness, dizziness, fainting, or chills. Any of these symptoms may represent a serious problem that is an emergency. Do not wait to see if the symptoms will go away. Call your local emergency services (911 in the [place] Do not drive yourself to the hospital. MAKE SURE YOU:  Understand these instructions.  Will  watch your condition.  Will get help right away if you are not doing well or get worse. Document Released: 07/07/2010 Document Revised: 02/04/2013 Document Reviewed: 12/04/2012 [facility] Patient Information 2014 [place], [place].  Cardiac Diet A cardiac diet can help stop heart disease or a stroke from happening. It involves eating less unhealthy fats and eating more healthy fats.  FOODS TO AVOID OR LIMIT  Limit saturated fats. This type of fat is found in oils and dairy products, such as:  Coconut oil.  Palm oil.  Cocoa butter.  Butter.  Avoid trans-fat or hydrogenated oils. These are found in fried or pre-made baked goods, such as:  Margarine.  Pre-made cookies, cakes, and crackers.  Limit processed meats (hot dogs, deli meats, sausage) to 3 ounces a week.  Limit high-fat meats (marbled meats, fried chicken, or chicken with skin) to 3 ounces a week.  Limit salt (sodium) to 1500 milligrams a day.   Limit sweets and drinks with added sugar to no more than 5 servings a week. One serving is:  1 tablespoon of sugar.  1 tablespoon of jelly or jam.   cup sorbet.  1 cup lemonade.   cup regular soda. EAT MORE OF THE FOLLOWING FOODS Fruit  Eat 4to 5 servings a day. One serving of fruit is:  1 medium whole fruit.   cup dried fruit.   cup of fresh, frozen, or  canned fruit.   cup 100% fruit juice. Vegetables  Eat 4 to 5 servings a day. One serving is:  1 cup raw leafy vegetables.   cup raw or cooked, cut-up vegetables.   cup vegetable juice. Whole Grains  Eat 3 servings a day (1 ounce equals 1 serving). Legumes (such as beans, peas, and lentils)   Eat at least 4 servings a week ( cup equals 1 serving). Nuts and Seeds   Eat at least 4 servings a week ( cup equals 1 serving). Dietary Fiber  Eat 20 to 30 grams a day. Some foods high in dietary fiber include:  Dried beans.  Citrus fruits.  Apples, bananas.  Broccoli, Brussels sprouts, and  eggplant.  Oats. Omega-3 Fats  Eat food with omega-3 fats. You can also take a dietary pill (supplement) that has 1 gram of DHA and EPA. Have 3.5 ounces of fatty fish a week, such as:  Salmon.  Mackerel.  [place] tuna.  Sardines.  Lake trout.  Herring. PREPARING YOUR FOOD  Broil, bake, steam, or roast foods. Do not fry food. Do not cook food in butter (fat).  Use non-stick cooking sprays.  Remove skin from poultry, such as chicken and [place].  Remove fat from meat.  Take the fat off the top of stews, soups, and gravy.  Use lemon or herbs to flavor food instead of using butter or margarine.  Use nonfat yogurt, salsa, or low-fat dressings for salads. Document Released: 12/04/2011 Document Reviewed: 12/04/2011 [facility] Patient Information 2014 [place], [place].  Syncope Syncope is a fainting spell. This means the person loses consciousness and drops to the ground. The person is generally unconscious for less than 5 minutes. The person may have some muscle twitches for up to 15 seconds before waking up and returning to normal. Syncope occurs more often in elderly people, but it can happen to anyone. While most causes of syncope are not dangerous, syncope can be a sign of a serious medical problem. It is important to seek medical care.  CAUSES  Syncope is caused by a sudden decrease in blood flow to the brain. The specific cause is often not determined. Factors that can trigger syncope include:  Taking medicines that lower blood pressure.  Sudden changes in posture, such as standing up suddenly.  Taking more medicine than prescribed.  Standing in one place for too long.  Seizure disorders.  Dehydration and excessive exposure to heat.  Low blood sugar (hypoglycemia).  Straining to have a bowel movement.  Heart disease, irregular heartbeat, or other circulatory problems.  Fear, emotional distress, seeing blood, or severe pain. SYMPTOMS  Right before fainting, you  may:  Feel dizzy or lightheaded.  Feel nauseous.  See all white or all black in your field of vision.  Have cold, clammy skin. DIAGNOSIS  Your caregiver will ask about your symptoms, perform a physical exam, and perform electrocardiography (ECG) to record the electrical activity of your heart. Your caregiver may also perform other heart or blood tests to determine the cause of your syncope. TREATMENT  In most cases, no treatment is needed. Depending on the cause of your syncope, your caregiver may recommend changing or stopping some of your medicines. HOME CARE INSTRUCTIONS  Have someone stay with you until you feel stable.  Do not drive, operate machinery, or play sports until your caregiver says it is okay.  Keep all follow-up appointments as directed by your caregiver.  Lie down right away if you start feeling like you might  faint. Breathe deeply and steadily. Wait until all the symptoms have passed.  Drink enough fluids to keep your urine clear or pale yellow.  If you are taking blood pressure or heart medicine, get up slowly, taking several minutes to sit and then stand. This can reduce dizziness. SEEK IMMEDIATE MEDICAL CARE IF:   You have a severe headache.  You have unusual pain in the chest, abdomen, or back.  You are bleeding from the mouth or rectum, or you have black or tarry stool.  You have an irregular or very fast heartbeat.  You have pain with breathing.  You have repeated fainting or seizure-like jerking during an episode.  You faint when sitting or lying down.  You have confusion.  You have difficulty walking.  You have severe weakness.  You have vision problems. If you fainted, call your local emergency services (911 in [place] Do not drive yourself to the hospital.  MAKE SURE YOU:  Understand these instructions.  Will watch your condition.  Will get help right away if you are not doing well or get worse. Document Released: 06/04/2005  Document Revised: 12/04/2011 Document Reviewed: 08/03/2011 [facility] Patient Information 2014 [place].  Syncope Syncope means a person passes out (faints). The person usually wakes up in less than 5 minutes. It is important to seek medical care for syncope. HOME CARE  Have someone stay with you until you feel normal.  Do not drive, use machines, or play sports until your doctor says it is okay.  Keep all doctor visits as told.  Lie down when you feel like you might pass out. Take deep breaths. Wait until you feel normal before standing up.  Drink enough fluids to keep your pee (urine) clear or pale yellow.  If you take blood pressure or heart medicine, get up slowly. Take several minutes to sit and then stand. GET HELP RIGHT AWAY IF:   You have a severe headache.  You have pain in the chest, belly (abdomen), or back.  You are bleeding from the mouth or butt (rectum).  You have black or tarry poop (stool).  You have an irregular or very fast heartbeat.  You have pain with breathing.  You keep passing out, or you have shaking (seizures) when you pass out.  You pass out when sitting or lying down.  You feel confused.  You have trouble walking.  You have severe weakness.  You have vision problems. If you fainted, call your local emergency services (911 in [place] Do not drive yourself to the hospital. MAKE SURE YOU:   Understand these instructions.  Will watch your condition.  Will get help right away if you are not doing well or get worse. Document Released: 11/21/2007 Document Revised: 12/04/2011 Document Reviewed: 08/03/2011 [facility] Patient Information 2014 [place], [place].

## 2013-06-20 NOTE — Progress Notes (Signed)
VASCULAR LAB PRELIMINARY  PRELIMINARY  PRELIMINARY  PRELIMINARY  Carotid Dopplers completed.    Preliminary report:  There is 1-39% ICA stenosis.  Vertebral artery flow is antegrade.  Juan [name], RVT 06/20/2013, 12:05 PM

## 2013-06-22 ENCOUNTER — Telehealth: Payer: Self-pay | Admitting: Cardiovascular Disease

## 2013-06-22 NOTE — Telephone Encounter (Signed)
New message     Pt discharged from [facility].  Daughter thinks pt needs a monitor or a hosp follow up appt.  Can you check the hosp records and see what he need?

## 2013-06-22 NOTE — Telephone Encounter (Signed)
Scheduled follow up with Juan Rim PA for 06/24/13. Juan [name] did have a question about one medication, the Januvia. Literature on the Januvia recommends not taking if you have had pancreatitis and patient has had pancreatitis. Advised to call PCP regarding blood sugar medication. Juan [name] verbalized understanding.

## 2013-06-23 ENCOUNTER — Telehealth: Payer: Self-pay | Admitting: *Deleted

## 2013-06-23 DIAGNOSIS — R55 Syncope and collapse: Secondary | ICD-10-CM

## 2013-06-23 NOTE — Telephone Encounter (Signed)
Needs follow up event monitor. Call daughter as patient cannot hear.   SPOKE  WITH PT'S  DAUGHTER   WILL NOTIFY SCHEDULERS TO MAKE  ARRANGEMENTS  FOR PT TO  GET MONITOR  NEED TO CALL  DAUGHTER TO   ARRANGE .Adonis Housekeeper

## 2013-06-24 ENCOUNTER — Encounter: Payer: Self-pay | Admitting: *Deleted

## 2013-06-24 ENCOUNTER — Ambulatory Visit (INDEPENDENT_AMBULATORY_CARE_PROVIDER_SITE_OTHER): Payer: Federal, State, Local not specified - PPO | Admitting: Physician Assistant

## 2013-06-24 ENCOUNTER — Encounter (INDEPENDENT_AMBULATORY_CARE_PROVIDER_SITE_OTHER): Payer: Federal, State, Local not specified - PPO

## 2013-06-24 ENCOUNTER — Encounter: Payer: Self-pay | Admitting: Physician Assistant

## 2013-06-24 VITALS — BP 148/80 | HR 62 | Ht 71.0 in | Wt 195.0 lb

## 2013-06-24 DIAGNOSIS — E782 Mixed hyperlipidemia: Secondary | ICD-10-CM

## 2013-06-24 DIAGNOSIS — I2510 Atherosclerotic heart disease of native coronary artery without angina pectoris: Secondary | ICD-10-CM

## 2013-06-24 DIAGNOSIS — R55 Syncope and collapse: Secondary | ICD-10-CM

## 2013-06-24 DIAGNOSIS — I10 Essential (primary) hypertension: Secondary | ICD-10-CM

## 2013-06-24 DIAGNOSIS — N189 Chronic kidney disease, unspecified: Secondary | ICD-10-CM | POA: Insufficient documentation

## 2013-06-24 HISTORY — DX: Chronic kidney disease, unspecified: N18.9

## 2013-06-24 NOTE — Progress Notes (Signed)
Patient ID: Juan [name], male   DOB: 04-22-1936, 78 y.o.   MRN: [id number] E-Cardio verite 21 day cardiac event monitor applied to patient.

## 2013-06-24 NOTE — Progress Notes (Signed)
[address], [place], [place]  [address] Phone: [phone] Fax:  [phone]  Date:  06/24/2013   ID:  AUGIE VANE, DOB 01/11/1936, MRN [id number]  PCP:  Unice Cobble, MD  Cardiologist:  Dr. Jenkins Rouge     History of Present Illness: Juan [name] is a 78 y.o. male with a hx of CAD, s/p CABG in 2000, HTN, HL, deafness s/p cochlear implant. Last myoview 9/08: inf-septal scar with mild peri-infarct ischemia (low risk).  He was seen in the ED with acute onset of imbalance in 01/2012. Seen by neurology. Head CT (MRI not done b/c of cochlear implant) was neg. Carotid U/S: no ICA stenosis. Echo 8/13: mild focal basal septal hypertrophy, Gr 1 DD, mild LAE. Saw neurology in f/u and episode was felt to be either dehydration or a TIA. ASA was continued.  Patient was recently admitted to the hospital 1/2-1/3 with syncope.  His syncopal episode occurred while lifting weights. Syncopal episode was accompanied by nausea and vomiting. Head CT demonstrated no acute findings.  Carotid US (06/20/2013): 1-39% bilateral ICA stenosis.  Echo (06/20/2011): EF 50-55%, normal wall motion, grade 1 diastolic dysfunction, mild LAE.  He was evaluated by cardiology. Symptoms sound more vasovagal than cardiac in nature.  Lexiscan Myoview (06/20/2013): No reversible ischemia, inferior and septal infarct, inferior and septal hypokinesis, EF 60%. Outpatient event monitor was recommended.  He is doing well.  No further syncope.  He denies any chest pain, dyspnea, orthopnea, PND, edema.    Recent Labs: 04/21/2013: Direct LDL 120.1  06/19/2013: ALT 21; HDL 32*; LDL (calc) 117*; TSH 0.820  06/20/2013: Creatinine 1.40*; Hemoglobin 12.9*; Potassium 3.9   Wt Readings from Last 3 Encounters:  06/24/13 195 lb (88.451 kg)  06/19/13 185 lb (83.915 kg)  04/17/13 195 lb 6.4 oz (88.633 kg)     Past Medical History  Diagnosis Date  . Hyperlipidemia   . Duodenitis   . Gout   . Diverticular disease   . Elevated PSA   .  Tubular adenoma polyp of rectum   . Pancreatitis     a. ? Simvastatin related  . GERD (gastroesophageal reflux disease)   . Hydronephrosis with ureteropelvic junction obstruction   . Hypertension   . TIA (transient ischemic attack)     a. 01/2012 - presentation as acute imbalance;  b. 01/2012 carotid U/S w/o signif stenosis;  c. 01/2012 Echo: EF 60-65%, Gr 1 DD.;  d.  Carotid US (06/20/2013): 1-39% bilateral ICA stenosis  . Hypercalcemia 02/02/12    a. 01/2012: 10.7.  Marland Kitchen Syncope     a. 06/2013  . Pneumonia     "I think only once" (06/19/2013)  . Type II diabetes mellitus     "diet and exercise controlled at present" (06/19/2013)  . Coronary artery disease     a. s/p CABG 2000; b. myoview 9/08: inf-septal scar with mild peri-infarct ischemia (low risk);  c.  Echo 8/13: mild focal basal septal hypertrophy, Gr 1 DD, mild LAE;  d. Lexiscan Myoview (06/20/2013): No reversible ischemia, inferior and septal infarct, inferior and septal hypokinesis, EF 60%;  e.  Echo (06/20/2011): EF 50-55%, normal wall motion, grade 1 diastolic dysfunction, mild LAE  . CKD (chronic kidney disease) 06/24/2013    Current Outpatient Prescriptions  Medication Sig Dispense Refill  . allopurinol (ZYLOPRIM) 100 MG tablet Take 100 mg by mouth daily.      Marland Kitchen aspirin EC 81 MG tablet Take 81 mg by mouth daily.      Marland Kitchen  Choline Fenofibrate 135 MG capsule Take 135 mg by mouth daily.      . finasteride (PROSCAR) 5 MG tablet Take 5 mg by mouth daily.      . metoprolol (LOPRESSOR) 50 MG tablet Take 50 mg by mouth 2 (two) times daily.      . pravastatin (PRAVACHOL) 20 MG tablet Take 20 mg by mouth daily.      . sitaGLIPtin (JANUVIA) 50 MG tablet Take 1 tablet (50 mg total) by mouth daily.  30 tablet  0   No current facility-administered medications for this visit.    Allergies:   Simvastatin   Social History:  The patient  reports that he has been smoking Cigars.  He has never used smokeless tobacco. He reports that he drinks alcohol. He  reports that he does not use illicit drugs.   Family History:  The patient's family history includes Heart attack in his father; Melanoma in his father. There is no history of Diabetes.   ROS:  Please see the history of present illness.      All other systems reviewed and negative.   PHYSICAL EXAM: VS:  BP 148/80  Pulse 62  Ht 5\' 11"  (1.803 m)  Wt 195 lb (88.451 kg)  BMI 27.21 kg/m2 Well nourished, well developed, in no acute distress HEENT: normal Neck: no JVD Cardiac:  normal S1, S2; RRR; no murmur Lungs:  clear to auscultation bilaterally, no wheezing, rhonchi or rales Abd: soft, nontender, no hepatomegaly Ext: no edema Skin: warm and dry Neuro:  CNs 2-12 intact, no focal abnormalities noted  EKG:  NSR, HR 61, normal axis, unusual P axis, nonspecific ST-T wave changes, no change from prior trace     ASSESSMENT AND PLAN:  1. Syncope:  No recurrence.  Head CT was negative.  Echo demonstrated normal LVF.  Myoview was neg for ischemia.  He likely had a vasovagal event.  Will set him up for an event monitor to rule out arrhythmia.   2. CAD:  No angina.  Continue ASA and statin.  3. Hypertension:  Controlled. 4. Hyperlipidemia:  Continue statin.  5. Disposition:  F/u with Dr. Jenkins Rouge in 2 mos.   Signed, Richardson Dopp, PA-C  06/24/2013 4:22 PM

## 2013-06-24 NOTE — Patient Instructions (Signed)
Your physician recommends that you continue on your current medications as directed. Please refer to the Current Medication list given to you today.   Your physician has recommended that you wear an event monitor. Event monitors are medical devices that record the heart's electrical activity. Doctors most often [place] these monitors to diagnose arrhythmias. Arrhythmias are problems with the speed or rhythm of the heartbeat. The monitor is a small, portable device. You can wear one while you do your normal daily activities. This is usually used to diagnose what is causing palpitations/syncope (passing out).  Your physician recommends that you schedule a follow-up appointment in: [place]. Johnsie Cancel

## 2013-06-25 ENCOUNTER — Ambulatory Visit (INDEPENDENT_AMBULATORY_CARE_PROVIDER_SITE_OTHER): Payer: Federal, State, Local not specified - PPO | Admitting: Internal Medicine

## 2013-06-25 ENCOUNTER — Encounter: Payer: Self-pay | Admitting: Internal Medicine

## 2013-06-25 VITALS — BP 111/67 | HR 74 | Temp 97.8°F | Wt 196.6 lb

## 2013-06-25 DIAGNOSIS — E1129 Type 2 diabetes mellitus with other diabetic kidney complication: Secondary | ICD-10-CM

## 2013-06-25 DIAGNOSIS — N259 Disorder resulting from impaired renal tubular function, unspecified: Secondary | ICD-10-CM

## 2013-06-25 DIAGNOSIS — R55 Syncope and collapse: Secondary | ICD-10-CM

## 2013-06-25 DIAGNOSIS — Z8719 Personal history of other diseases of the digestive system: Secondary | ICD-10-CM

## 2013-06-25 DIAGNOSIS — E1165 Type 2 diabetes mellitus with hyperglycemia: Secondary | ICD-10-CM

## 2013-06-25 LAB — BASIC METABOLIC PANEL
BUN: 26 mg/dL — ABNORMAL HIGH (ref 6–23)
CALCIUM: 10.1 mg/dL (ref 8.4–10.5)
CO2: 27 mEq/L (ref 19–32)
Chloride: 105 mEq/L (ref 96–112)
Creatinine, Ser: 1.3 mg/dL (ref 0.4–1.5)
GFR: 56.14 mL/min — ABNORMAL LOW (ref >60.00)
GLUCOSE: 156 mg/dL — AB (ref 70–99)
Potassium: 3.8 mEq/L (ref 3.5–5.1)
SODIUM: 140 meq/L (ref 135–145)

## 2013-06-25 MED ORDER — ONETOUCH DELICA LANCETS FINE MISC: 1.0000 | Freq: Every day | Status: DC

## 2013-06-25 MED ORDER — GLUCOSE BLOOD VI STRP: ORAL_STRIP | Status: DC

## 2013-06-25 NOTE — Patient Instructions (Addendum)
Your next office appointment will in 4 weeks with glucose readings to review . Those instructions will be transmitted to you through My Chart . Follow the low carb nutrition program in [facility] as closely as possible to prevent Diabetes progression & complications. White carbohydrates (potatoes, rice, bread, and pasta) have a high spike of sugar and a high load of sugar. For example a  baked potato has a cup of sugar and a  french fry  2 teaspoons of sugar. Yams, wild  rice, whole grained bread &  wheat pasta have been much lower spike and load of  Sugar. Monitor the glucose before eating  (fasting blood sugar) Monday, Wednesday, Friday, and Sunday. This should range from 100-150. Check glucose 2 hours after  breakfast on Tuesday; 2 hours after lunch on Thursday; and 2 hours after the meal on Saturday. This value should average  less than 180, ideally less than 160.

## 2013-06-25 NOTE — Assessment & Plan Note (Addendum)
Check glucose once daily if possible Fasting or morning glucose recommended M, W, F, & Sun if possible. Goal= 100-150 Glucose 2 hours after breakfast Tues, after lunch Thurs & 2 hrs after eve meal Sat if possible. Goal = < 180 Hold Metformin due to renal issues.Continue low dose Januvia( note: PMH of pancreatitis) Low carb diet

## 2013-06-25 NOTE — Assessment & Plan Note (Addendum)
Event monitor is being worn

## 2013-06-25 NOTE — Progress Notes (Signed)
Pre visit review using our clinic review tool, if applicable. No additional management support is needed unless otherwise documented below in the visit note. 

## 2013-06-25 NOTE — Progress Notes (Signed)
Subjective:    Patient ID: Juan [name], male    DOB: December 26, 1935, 78 y.o.   MRN: [id number]  HPI  His recent hospitalization was reviewed as well as the cardiology evaluation 06/24/13. Pertinent data were entered in the problem list under the diagnosis syncope. Presumed diagnosis is vasovagal phenomena. Event monitor is pending.  There've been no subsequent events  He is not monitoring his glucoses at home. His A1c was 8.4% on 1/2. He states he does avoid sugar Regular exercise described as 3 times per week for 60 minutes weights sitting & movement exercises Eye exam current. Foot care not current   Review of Systems                      No excess thirst ;  excess hunger ; or excess urination reported                              No lightheadedness with standing reported No chest pain ; palpitations ; claudication described .                                                                                                                             No non healing skin  ulcers or sores of extremities noted. No numbness or tingling or burning in feet described                                                                                                                                             No significant change in weight  No blurred,double, or loss of vision reported  .         Objective:   Physical Exam Appears healthy and well-nourished & in no acute distress  Speech slurred ; poor hearing despite assistive device  No carotid bruits are present.No neck pain distention present at 10 - 15 degrees. Thyroid normal to palpation  Heart rhythm and rate are normal with no significant murmurs or gallops.  Chest is clear with no increased work of breathing  There is no evidence of aortic aneurysm or renal artery bruits  Abdomen soft with no organomegaly or masses. No HJR  No clubbing, cyanosis or edema present.  Pedal pulses are intact but decreased DPP  No ischemic skin  changes are present .   Alert and oriented. Strength, tone normal         Assessment & Plan:  See Current Assessment & Plan in Problem List under specific Diagnosis

## 2013-06-25 NOTE — Progress Notes (Signed)
Patient giben One Touch Verio IQ meter. Demonstration of meter given. Patient performed one glucose check. Reading 212.

## 2013-06-26 ENCOUNTER — Telehealth: Payer: Self-pay

## 2013-06-26 NOTE — Telephone Encounter (Signed)
Relevant patient education assigned to patient using Emmi. ° °

## 2013-07-07 ENCOUNTER — Other Ambulatory Visit: Payer: Self-pay | Admitting: Cardiovascular Disease

## 2013-07-15 ENCOUNTER — Encounter: Payer: Self-pay | Admitting: Internal Medicine

## 2013-07-15 ENCOUNTER — Ambulatory Visit (INDEPENDENT_AMBULATORY_CARE_PROVIDER_SITE_OTHER): Payer: Federal, State, Local not specified - PPO | Admitting: Internal Medicine

## 2013-07-15 VITALS — BP 125/63 | HR 67 | Temp 97.7°F | Wt 194.4 lb

## 2013-07-15 DIAGNOSIS — E1129 Type 2 diabetes mellitus with other diabetic kidney complication: Secondary | ICD-10-CM

## 2013-07-15 DIAGNOSIS — E1165 Type 2 diabetes mellitus with hyperglycemia: Principal | ICD-10-CM

## 2013-07-15 NOTE — Patient Instructions (Signed)
Check glucose once daily if possible Fasting or morning glucose recommended M, W, F, & Sun if possible. Goal= 100-150 Glucose 2 hours after breakfast Tues, after lunch Thurs & 2 hrs after eve meal Sat if possible. Goal = < 180 

## 2013-07-15 NOTE — Progress Notes (Signed)
Pre visit review using our clinic review tool, if applicable. No additional management support is needed unless otherwise documented below in the visit note. 

## 2013-07-15 NOTE — Assessment & Plan Note (Signed)
No change  Labs in 3 mos

## 2013-07-15 NOTE — Progress Notes (Signed)
Subjective:    Patient ID: Juan [name], male    DOB: 03-19-1936, 78 y.o.   MRN: [id number]  HPI Diabetes status assessment: Fasting or morning glucose range average is not currently monitored. Highest glucose 2 hours after any meal is  158-280 (acknowledges that 280 reading was 2 hours after pancakes/syrup) No hypoglycemia reported                                                                                                                 Regular exercise is seniors aerobics described as 3 times per week (MWF) for 45 minutes. Reports no sugar diet followed, although admits to pancakes/syrup. Medication compliance is good. No medication adverse effects noted. Eye exam current - 1 year ago. Foot care not current.  A1c & average sugar with long-term %  Risk reviewed   Review of Systems No excess thirst, excess hunger, excess urination reported,                              No lightheadedness with standing reported. No chest pain ; palpitations ; claudication described .                                                                                                                             No non healing skin  ulcers or sores of extremities noted. No numbness or tingling or burning in feet described                                                                                                                                             No significant change in weight of pound gain pound loss. No blurred,double, or loss of vision reported  .           Objective:   Physical Exam  Gen.: Healthy and well-nourished in appearance. Alert, appropriate and cooperative throughout exam.Appears stated age.  Ears: augmentative device on R Eyes: No corneal or conjunctival inflammation noted.  Neck: No deformities, masses, or tenderness noted. Thyroid normal Lungs: Normal respiratory effort; chest expands symmetrically. Lungs are clear to auscultation without rales, wheezes, or increased  work of breathing. Heart: Normal rate and rhythm. Normal S1 and S2. No gallop, click, or rub. S4 w/o murmur. Abdomen: Bowel sounds normal; abdomen soft and nontender. No masses, organomegaly or hernias noted.                  Musculoskeletal/extremities:  No clubbing, cyanosis, edema, or significant extremity  deformity noted. Range of motion normal .Tone & strength normal. Vascular: Carotid, radial artery, dorsalis pedis and  posterior tibial pulses are full and equal.  No bruits present. Neurologic: Alert and oriented x3.  Skin: Intact without suspicious lesions or rashes. Lymph: No cervical, axillary lymphadenopathy present. Psych: Mood and affect are normal. Normally interactive                                                                                        Assessment & Plan:  See Current Assessment & Plan in Problem List under specific Diagnosis

## 2013-07-16 ENCOUNTER — Ambulatory Visit: Payer: Federal, State, Local not specified - PPO | Admitting: Internal Medicine

## 2013-07-16 ENCOUNTER — Telehealth: Payer: Self-pay | Admitting: Internal Medicine

## 2013-07-16 MED ORDER — SITAGLIPTIN PHOSPHATE 100 MG PO TABS: 50.0000 mg | ORAL_TABLET | Freq: Every day | ORAL | Status: DC

## 2013-07-16 NOTE — Telephone Encounter (Signed)
Relevant patient education assigned to patient using Emmi. ° °

## 2013-07-23 ENCOUNTER — Encounter: Payer: Federal, State, Local not specified - PPO | Attending: Internal Medicine

## 2013-07-23 VITALS — Ht 71.0 in | Wt 190.0 lb

## 2013-07-23 DIAGNOSIS — E1129 Type 2 diabetes mellitus with other diabetic kidney complication: Secondary | ICD-10-CM

## 2013-07-23 DIAGNOSIS — E119 Type 2 diabetes mellitus without complications: Secondary | ICD-10-CM | POA: Insufficient documentation

## 2013-07-23 DIAGNOSIS — Z713 Dietary counseling and surveillance: Secondary | ICD-10-CM | POA: Insufficient documentation

## 2013-07-23 DIAGNOSIS — E1165 Type 2 diabetes mellitus with hyperglycemia: Secondary | ICD-10-CM

## 2013-07-23 NOTE — Progress Notes (Signed)
Patient was seen on 07/23/13 for the first of a series of three diabetes self-management courses at the [facility].  Current HbA1c: 8.4%  The following learning objectives were met by the patient during this class:  Describe diabetes  State some common risk factors for diabetes  Defines the role of glucose and insulin  Identifies type of diabetes and pathophysiology  Describe the relationship between diabetes and cardiovascular risk  State the members of the Healthcare Team  States the rationale for glucose monitoring  State when to test glucose  State their individual Target Range  State the importance of logging glucose readings  Describe how to interpret glucose readings  Identifies A1C target  Explain the correlation between A1c and eAG values  State symptoms and treatment of high blood glucose  State symptoms and treatment of low blood glucose  Explain proper technique for glucose testing  Identifies proper sharps disposal  Handouts given during class include:  Living Well with Diabetes book  Carb Counting and Meal Planning book  Meal Plan Card  Carbohydrate guide  Meal planning worksheet  Low Sodium Flavoring Tips  The diabetes portion plate  E2V to eAG Conversion Chart  Diabetes Medications  Diabetes Recommended Care Schedule  Support Group  Diabetes Success Plan  Core Class Satisfaction Survey  Follow-Up Plan:  Attend core 2

## 2013-07-30 DIAGNOSIS — R739 Hyperglycemia, unspecified: Secondary | ICD-10-CM

## 2013-07-30 DIAGNOSIS — E1165 Type 2 diabetes mellitus with hyperglycemia: Secondary | ICD-10-CM

## 2013-07-30 DIAGNOSIS — E1129 Type 2 diabetes mellitus with other diabetic kidney complication: Secondary | ICD-10-CM

## 2013-07-30 NOTE — Progress Notes (Signed)
Patient was seen on 07/30/13 for the second of a series of three diabetes self-management courses at the [facility]. The following learning objectives were met by the patient during this class:   Describe the role of different macronutrients on glucose  Explain how carbohydrates affect blood glucose  State what foods contain the most carbohydrates  Demonstrate carbohydrate counting  Demonstrate how to read Nutrition Facts food label  Describe effects of various fats on heart health  Describe the importance of good nutrition for health and healthy eating strategies  Describe techniques for managing your shopping, cooking and meal planning  List strategies to follow meal plan when dining out  Describe the effects of alcohol on glucose and how to use it safely  Goals:  Follow Diabetes Meal Plan as instructed  Eat 3 meals and 2 snacks, every 3-5 hrs  Aim for carbohydrate intake of 30-45 grams carbohydrate/meal Aim for carbohydrate intake of 0-15 grams  carbohydrate/snack Add lean protein foods to meals/snacks  Monitor glucose levels as instructed by your doctor   Follow-Up Plan:  Attend Core 3  Work towards following your personal food plan.

## 2013-08-10 ENCOUNTER — Telehealth: Payer: Self-pay | Admitting: *Deleted

## 2013-08-10 NOTE — Telephone Encounter (Signed)
LM  RE MONITOR  RESULTS  PER  DR NISHAN   NSR NO  ARHYTHMIA AND  ARTIFACT./CY

## 2013-08-12 NOTE — Telephone Encounter (Signed)
LMTCB ./CY 

## 2013-08-14 DIAGNOSIS — E1165 Type 2 diabetes mellitus with hyperglycemia: Principal | ICD-10-CM

## 2013-08-14 DIAGNOSIS — E1129 Type 2 diabetes mellitus with other diabetic kidney complication: Secondary | ICD-10-CM

## 2013-08-14 NOTE — Progress Notes (Signed)
Patient was seen on 08/14/13 for the third of a series of three diabetes self-management courses at the [facility]. The following learning objectives were met by the patient during this class:    State the amount of activity recommended for healthy living   Describe activities suitable for individual needs   Identify ways to regularly incorporate activity into daily life   Identify barriers to activity and ways to over come these barriers  Identify diabetes medications being personally used and their primary action for lowering glucose and possible side effects   Describe role of stress on blood glucose and develop strategies to address psychosocial issues   Identify diabetes complications and ways to prevent them  Explain how to manage diabetes during illness   Evaluate success in meeting personal goal   Establish 2-3 goals that they will plan to diligently work on until they return for the  91-monthfollow-up visit  Goals:  Follow Diabetes Meal Plan as instructed  Aim for 15-30 mins of physical activity daily as tolerated  Bring food record and glucose log to your follow up visit  Your patient has established the following 4 month goals in their individualized success plan:  Count carbohydrates at most meals and snacks  Decrease sedentary activity by watching less tv  Your patient has identified these potential barriers to change:  Portion control  Your patient has identified their diabetes self-care support plan as  family

## 2013-08-18 NOTE — Telephone Encounter (Signed)
lmtcb ./cy 

## 2013-08-24 ENCOUNTER — Encounter: Payer: Self-pay | Admitting: *Deleted

## 2013-08-24 NOTE — Telephone Encounter (Signed)
LMTCB   RESULTS LETTER MAILED TO PT  TODAY ./CY

## 2013-08-25 ENCOUNTER — Ambulatory Visit (INDEPENDENT_AMBULATORY_CARE_PROVIDER_SITE_OTHER): Payer: Federal, State, Local not specified - PPO | Admitting: Cardiovascular Disease

## 2013-08-25 ENCOUNTER — Encounter: Payer: Self-pay | Admitting: Cardiovascular Disease

## 2013-08-25 VITALS — BP 115/68 | HR 65 | Ht 71.0 in | Wt 188.0 lb

## 2013-08-25 DIAGNOSIS — I2510 Atherosclerotic heart disease of native coronary artery without angina pectoris: Secondary | ICD-10-CM

## 2013-08-25 DIAGNOSIS — E1129 Type 2 diabetes mellitus with other diabetic kidney complication: Secondary | ICD-10-CM

## 2013-08-25 DIAGNOSIS — I10 Essential (primary) hypertension: Secondary | ICD-10-CM

## 2013-08-25 DIAGNOSIS — R55 Syncope and collapse: Secondary | ICD-10-CM

## 2013-08-25 DIAGNOSIS — E1165 Type 2 diabetes mellitus with hyperglycemia: Secondary | ICD-10-CM

## 2013-08-25 NOTE — Assessment & Plan Note (Signed)
Etiology unclear  CT hear, carotid normal  Nonrecurrent  ? Vasovagal improved

## 2013-08-25 NOTE — Progress Notes (Signed)
Patient ID: Juan [name], male   DOB: [birth year], 78 y.o.   MRN: [id number] Juan [name] is a 78 y.o. male with a hx of CAD, s/p CABG in 2000, HTN, HL, deafness s/p cochlear implant. Last myoview 9/08: inf-septal scar with mild peri-infarct ischemia (low risk). He was seen in the ED with acute onset of imbalance in 01/2012. Seen by neurology. Head CT (MRI not done b/c of cochlear implant) was neg. Carotid U/S: no ICA stenosis. Echo 8/13: mild focal basal septal hypertrophy, Gr 1 DD, mild LAE. Saw neurology in f/u and episode was felt to be either dehydration or a TIA. ASA was continued.  Patient was recently admitted to the hospital 1/2-1/3 with syncope. His syncopal episode occurred while lifting weights. Syncopal episode was accompanied by nausea and vomiting. Head CT demonstrated no acute findings. Carotid US (06/20/2013): 1-39% bilateral ICA stenosis. Echo (06/20/2011): EF 50-55%, normal wall motion, grade 1 diastolic dysfunction, mild LAE. He was evaluated by cardiology. Symptoms sound more vasovagal than cardiac in nature. Lexiscan Myoview (06/20/2013): No reversible ischemia, inferior and septal infarct, inferior and septal hypokinesis, EF 60%. Outpatient event monitor was recommended.  He is doing well. No further syncope. He denies any chest pain, dyspnea, orthopnea, PND, edema.       ROS: Denies fever, malais, weight loss, blurry vision, decreased visual acuity, cough, sputum, SOB, hemoptysis, pleuritic pain, palpitaitons, heartburn, abdominal pain, melena, lower extremity edema, claudication, or rash.  All other systems reviewed and negative  General: Affect appropriate Healthy:  appears stated age 55: choclear implant  Neck supple with no adenopathy JVP normal no bruits no thyromegaly Lungs clear with no wheezing and good diaphragmatic motion Heart:  S1/S2 SEM  murmur, no rub, gallop or click PMI normal Abdomen: benighn, BS positve, no tenderness, no AAA no bruit.  No HSM or HJR Distal  pulses intact with no bruits No edema Neuro non-focal Skin warm and dry No muscular weakness   Current Outpatient Prescriptions  Medication Sig Dispense Refill  . allopurinol (ZYLOPRIM) 100 MG tablet Take 100 mg by mouth daily.      Marland Kitchen aspirin EC 81 MG tablet Take 81 mg by mouth daily.      . Choline Fenofibrate 135 MG capsule Take 135 mg by mouth daily.      . finasteride (PROSCAR) 5 MG tablet Take 5 mg by mouth daily.      Marland Kitchen glucose blood test strip Use as instructed  100 each  1  . metoprolol (LOPRESSOR) 50 MG tablet TAKE 1 TABLET BY MOUTH TWICE DAILY  60 tablet  2  . ONETOUCH DELICA LANCETS FINE MISC 1 Container by Does not apply route daily.  1 each  2  . pravastatin (PRAVACHOL) 20 MG tablet Take 20 mg by mouth daily.      . sitaGLIPtin (JANUVIA) 100 MG tablet Take 0.5 tablets (50 mg total) by mouth daily. Use samples as 1/2 pill daily  42 tablet  0   No current facility-administered medications for this visit.    Allergies  Simvastatin  Electrocardiogram:  SR rate 61 nonspecific St T wave changes   Assessment and Plan

## 2013-08-25 NOTE — Patient Instructions (Signed)
Your physician wants you to follow-up in:  6 MONTHS WITH DR NISHAN  You will receive a reminder letter in the mail two months in advance. If you don't receive a letter, please call our office to schedule the follow-up appointment. Your physician recommends that you continue on your current medications as directed. Please refer to the Current Medication list given to you today. 

## 2013-08-25 NOTE — Assessment & Plan Note (Signed)
Stable with no angina and good activity level.  Continue medical Rx  

## 2013-08-25 NOTE — Assessment & Plan Note (Signed)
Discussed low carb diet.  Target hemoglobin A1c is 6.5 or less.  Continue current medications.  

## 2013-08-25 NOTE — Assessment & Plan Note (Signed)
Well controlled.  Continue current medications and low sodium Dash type diet.    

## 2013-09-08 ENCOUNTER — Other Ambulatory Visit: Payer: Self-pay | Admitting: Internal Medicine

## 2013-09-08 ENCOUNTER — Other Ambulatory Visit (INDEPENDENT_AMBULATORY_CARE_PROVIDER_SITE_OTHER): Payer: Federal, State, Local not specified - PPO

## 2013-09-08 DIAGNOSIS — E785 Hyperlipidemia, unspecified: Secondary | ICD-10-CM

## 2013-09-08 DIAGNOSIS — E1165 Type 2 diabetes mellitus with hyperglycemia: Principal | ICD-10-CM

## 2013-09-08 DIAGNOSIS — E1129 Type 2 diabetes mellitus with other diabetic kidney complication: Secondary | ICD-10-CM

## 2013-09-08 LAB — BASIC METABOLIC PANEL
BUN: 28 mg/dL — ABNORMAL HIGH (ref 6–23)
CHLORIDE: 107 meq/L (ref 96–112)
CO2: 28 mEq/L (ref 19–32)
Calcium: 10.1 mg/dL (ref 8.4–10.5)
Creatinine, Ser: 1.4 mg/dL (ref 0.4–1.5)
GFR: 52.43 mL/min — ABNORMAL LOW (ref >60.00)
Glucose, Bld: 119 mg/dL — ABNORMAL HIGH (ref 70–99)
POTASSIUM: 4.2 meq/L (ref 3.5–5.1)
Sodium: 140 mEq/L (ref 135–145)

## 2013-09-08 LAB — LIPID PANEL
CHOLESTEROL: 141 mg/dL (ref 0–200)
HDL: 36 mg/dL — ABNORMAL LOW (ref >39.00)
LDL Cholesterol: 84 mg/dL (ref 0–99)
Total CHOL/HDL Ratio: 4
Triglycerides: 106 mg/dL (ref 0.0–149.0)
VLDL: 21.2 mg/dL (ref 0.0–40.0)

## 2013-09-08 LAB — MICROALBUMIN / CREATININE URINE RATIO
Creatinine,U: 106.8 mg/dL
MICROALB UR: 0.2 mg/dL (ref 0.0–1.9)
Microalb Creat Ratio: 0.2 mg/g (ref 0.0–30.0)

## 2013-09-08 LAB — HEMOGLOBIN A1C: HEMOGLOBIN A1C: 6.2 % (ref 4.6–6.5)

## 2013-09-15 ENCOUNTER — Telehealth: Payer: Self-pay

## 2013-09-15 ENCOUNTER — Encounter: Payer: Self-pay | Admitting: Internal Medicine

## 2013-09-15 ENCOUNTER — Telehealth: Payer: Self-pay | Admitting: Internal Medicine

## 2013-09-15 ENCOUNTER — Ambulatory Visit (INDEPENDENT_AMBULATORY_CARE_PROVIDER_SITE_OTHER): Payer: Federal, State, Local not specified - PPO | Admitting: Internal Medicine

## 2013-09-15 VITALS — BP 128/60 | HR 56 | Temp 97.5°F | Wt 186.0 lb

## 2013-09-15 DIAGNOSIS — R7989 Other specified abnormal findings of blood chemistry: Secondary | ICD-10-CM

## 2013-09-15 DIAGNOSIS — E1165 Type 2 diabetes mellitus with hyperglycemia: Principal | ICD-10-CM

## 2013-09-15 DIAGNOSIS — E782 Mixed hyperlipidemia: Secondary | ICD-10-CM

## 2013-09-15 DIAGNOSIS — E790 Hyperuricemia without signs of inflammatory arthritis and tophaceous disease: Secondary | ICD-10-CM

## 2013-09-15 DIAGNOSIS — N259 Disorder resulting from impaired renal tubular function, unspecified: Secondary | ICD-10-CM

## 2013-09-15 DIAGNOSIS — E1129 Type 2 diabetes mellitus with other diabetic kidney complication: Secondary | ICD-10-CM

## 2013-09-15 NOTE — Assessment & Plan Note (Signed)
Creatinine; some pre renal component Hydration reinforced

## 2013-09-15 NOTE — Progress Notes (Signed)
   Subjective:    Patient ID: Juan [name], male    DOB: Aug 31, 1935, 78 y.o.   MRN: [id number]  HPI HYPERTENSION: Disease Monitoring: Blood pressure range/ average: not monitored at home Medication Compliance: metoprolol taken  FASTING HYPERGLYCEMIA OR Diabetes :  Last A1C 09/08/13 = 6.2; microalbumin, urine = 0.2 FBS range/average: 130s Highest 2 hr post meal glucose: 182 Medication compliance: yes, Janumet  Hypoglycemia: none Ophthamology care: Dr. Gershon Crane - January 2015; up to date; no diabetic changes.  Podiatry care: none  HYPERLIPIDEMIA: Disease Monitoring: Total cholesterol =141, HDL = 36, Triglycerides = 106 Medication Compliance: pravastatin, finofibric    Review of Systems Chest pain, palpitations:   none    Dyspnea: none Edema: none Claudication: none Lightheadedness,Syncope: none Weight gain/loss: weight loss d/t portion control  Polyuria/phagia/dipsia: none   Blurred vision /diplopia/lossof vision: none Limb numbness/tingling/burning: none Non healing skin lesions: none Abd pain, bowel changes: none Myalgias: none Memory loss: none  Exercise: 1 hour each Monday, Wednesday, Friday isometrics/group exercise.  Diet: A heart healthy diet followed.     Objective:   Physical Exam Gen.: Healthy and well-nourished in appearance. Alert, appropriate and cooperative throughout exam.  Head: Normocephalic without obvious abnormalities; no alopecia.  Eyes: No corneal or conjunctival inflammation noted. Pupils equal round reactive to light and accommodation. Extraocular motion intact. Vision grossly normal with lenses.  Nose: External nasal exam reveals no deformity or inflammation. Nasal mucosa are pink and moist. No lesions or exudates noted.  Mouth: Oral mucosa and oropharynx reveal  Neck: No deformities, masses, or tenderness noted. Range of motion WNL.  Lungs: Normal respiratory effort; chest expands symmetrically. Lungs are clear to auscultation without rales,  wheezes, or increased work of breathing.  Heart: Normal rate and rhythm: S1, accentuated S2. No gallop, click, or rub. No murmur.  Abdomen: Significant truncal girth. Bowel sounds normal; abdomen soft and nontender. No masses, organomegaly.  Musculoskeletal/extremities: No deformity or scoliosis noted of the thoracic or lumbar spine. .  No clubbing, cyanosis, edema, or significant extremity deformity noted. Range of motion normal .Tone & strength normal.  Hand joints reveal no PIP or DJD changes. Fingernail / toenail health good.  Able to lie down & sit up w/o help.  Vascular: Carotid, radial artery, dorsalis pedis and posterior tibial pulses are full and equal. No bruits present.  Neurologic: Alert and oriented x3. Deep tendon reflexes symmetrical and normal.  Skin: Intact without suspicious lesions or rashes.  Lymph: No cervical, axillary lymphadenopathy present.  Psych: Mood and affect are normal. Normally interactive.      Assessment & Plan:  Reviewed labs; continue meds.

## 2013-09-15 NOTE — Telephone Encounter (Signed)
Relevant patient education assigned to patient using Emmi. ° °

## 2013-09-15 NOTE — Assessment & Plan Note (Signed)
At goal ; no change Lipids, LFTs in 4-6 mos

## 2013-09-15 NOTE — Progress Notes (Signed)
   Subjective:    Patient ID: Juan [name], male    DOB: 1936-05-16, 78 y.o.   MRN: [id number]  HPI HYPERTENSION:  Disease Monitoring:  Blood pressure range/ average: not monitored at home  Medication Compliance: metoprolol taken    Diabetes :  Last A1C 09/08/13 = 6.2; microalbumin, urine = 0.2  FBS range/average: 130s  Highest 2 hr post meal glucose: 182  Medication compliance: yes, Janumet  Hypoglycemia: none  Ophthamology care: Dr. Gershon Crane - January 2015; up to date; no diabetic changes.  Podiatry care: none   HYPERLIPIDEMIA:  Disease Monitoring: Total cholesterol =141, HDL = 36, Triglycerides = 106  Medication Compliance: pravastatin, fenofibrate   Review of Systems Chest pain, palpitations: none  Dyspnea: none  Edema: none  Claudication: none  Lightheadedness,Syncope: none  Weight gain/loss: weight loss d/t portion control  Polyuria/phagia/dipsia: none  Blurred vision /diplopia/lossof vision: none  Limb numbness/tingling/burning: none  Non healing skin lesions: none  Abd pain, bowel changes: none  Myalgias: none  Memory loss: none  Exercise: 1 hour each Monday, Wednesday, Friday as isometrics/group exercise.  Diet: A heart healthy diet followed.     Objective:   Physical Exam Gen.: Thin but healthy and well-nourished in appearance. Alert, appropriate and cooperative throughout exam.  Head: Normocephalic without obvious abnormalities; no alopecia.  Eyes: No corneal or conjunctival inflammation noted. Pupils equal round reactive to light and accommodation. Extraocular motion intact. Vision grossly normal with lenses.  Nose: External nasal exam reveals no deformity or inflammation. Nasal mucosa are pink and moist. No lesions or exudates noted.  Mouth: Oral mucosa and oropharynx reveal  Neck: No deformities, masses, or tenderness noted. Range of motion WNL.  Lungs: Normal respiratory effort; chest expands symmetrically. Lungs are clear to auscultation without rales,  wheezes, or increased work of breathing.  Heart: Normal rate and rhythm: S1, accentuated S2. No gallop, click, or rub. No murmur.  Abdomen: Significant truncal girth. Bowel sounds normal; abdomen soft and nontender. No masses, organomegaly.  Musculoskeletal/extremities: No deformity or scoliosis noted of the thoracic or lumbar spine. .  No clubbing, cyanosis, edema, or significant extremity deformity noted. Range of motion normal .Tone & strength normal.  Hand joints reveal no PIP or DJD changes.  Fingernail  health good.  Able to lie down & sit up w/o help.  Vascular: Carotid, radial artery, dorsalis pedis and posterior tibial pulses are full and equal. No bruits present.  Neurologic: Alert and oriented x3. Deep tendon reflexes symmetrical and normal.  Skin: Intact without suspicious lesions or rashes.  Lymph: No cervical, axillary lymphadenopathy present.  Psych: Mood and affect are normal. Normally interactive.      Assessment & Plan:  See Current Assessment & Plan in Problem List under specific Diagnosis Reviewed labs; stop Janumet

## 2013-09-15 NOTE — Patient Instructions (Signed)
Your next office appointment will be 02/03/14 for labs; office visit 2-3 days later.

## 2013-09-15 NOTE — Progress Notes (Signed)
Pre visit review using our clinic review tool, if applicable. No additional management support is needed unless otherwise documented below in the visit note. 

## 2013-09-15 NOTE — Assessment & Plan Note (Signed)
Incredible improvement D/C Januvia Schedule A1c & urine microalbumin in 4-6 months

## 2013-09-17 ENCOUNTER — Other Ambulatory Visit: Payer: Self-pay | Admitting: Cardiovascular Disease

## 2013-10-11 ENCOUNTER — Other Ambulatory Visit: Payer: Self-pay | Admitting: Internal Medicine

## 2013-10-19 ENCOUNTER — Other Ambulatory Visit: Payer: Self-pay | Admitting: Cardiovascular Disease

## 2013-10-20 ENCOUNTER — Other Ambulatory Visit: Payer: Self-pay | Admitting: Cardiovascular Disease

## 2013-10-20 ENCOUNTER — Encounter: Payer: Self-pay | Admitting: Gastroenterology

## 2013-11-16 ENCOUNTER — Ambulatory Visit: Payer: Federal, State, Local not specified - PPO

## 2013-11-17 ENCOUNTER — Other Ambulatory Visit: Payer: Self-pay | Admitting: Internal Medicine

## 2013-12-21 ENCOUNTER — Other Ambulatory Visit: Payer: Self-pay | Admitting: Internal Medicine

## 2014-02-04 ENCOUNTER — Other Ambulatory Visit (INDEPENDENT_AMBULATORY_CARE_PROVIDER_SITE_OTHER): Payer: Federal, State, Local not specified - PPO

## 2014-02-04 DIAGNOSIS — R7989 Other specified abnormal findings of blood chemistry: Secondary | ICD-10-CM

## 2014-02-04 DIAGNOSIS — E782 Mixed hyperlipidemia: Secondary | ICD-10-CM

## 2014-02-04 DIAGNOSIS — E1129 Type 2 diabetes mellitus with other diabetic kidney complication: Secondary | ICD-10-CM

## 2014-02-04 DIAGNOSIS — E790 Hyperuricemia without signs of inflammatory arthritis and tophaceous disease: Secondary | ICD-10-CM

## 2014-02-04 DIAGNOSIS — E1165 Type 2 diabetes mellitus with hyperglycemia: Principal | ICD-10-CM

## 2014-02-04 LAB — MICROALBUMIN / CREATININE URINE RATIO
CREATININE, U: 110.2 mg/dL
Microalb Creat Ratio: 1.9 mg/g (ref 0.0–30.0)
Microalb, Ur: 2.1 mg/dL — ABNORMAL HIGH (ref 0.0–1.9)

## 2014-02-04 LAB — HEPATIC FUNCTION PANEL
ALK PHOS: 39 U/L (ref 39–117)
ALT: 11 U/L (ref 0–53)
AST: 17 U/L (ref 0–37)
Albumin: 3.7 g/dL (ref 3.5–5.2)
BILIRUBIN TOTAL: 0.6 mg/dL (ref 0.2–1.2)
Bilirubin, Direct: 0.1 mg/dL (ref 0.0–0.3)
TOTAL PROTEIN: 7.4 g/dL (ref 6.0–8.3)

## 2014-02-04 LAB — LIPID PANEL
Cholesterol: 145 mg/dL (ref 0–200)
HDL: 34.1 mg/dL — ABNORMAL LOW (ref >39.00)
LDL Cholesterol: 90 mg/dL (ref 0–99)
NONHDL: 110.9
TRIGLYCERIDES: 105 mg/dL (ref 0.0–149.0)
Total CHOL/HDL Ratio: 4
VLDL: 21 mg/dL (ref 0.0–40.0)

## 2014-02-04 LAB — URIC ACID: Uric Acid, Serum: 7.4 mg/dL (ref 4.0–7.8)

## 2014-02-04 LAB — HEMOGLOBIN A1C: Hgb A1c MFr Bld: 6.1 % (ref 4.6–6.5)

## 2014-02-04 LAB — CK: Total CK: 165 U/L (ref 7–232)

## 2014-02-09 ENCOUNTER — Encounter: Payer: Self-pay | Admitting: Internal Medicine

## 2014-02-09 ENCOUNTER — Ambulatory Visit (INDEPENDENT_AMBULATORY_CARE_PROVIDER_SITE_OTHER): Payer: Federal, State, Local not specified - PPO | Admitting: Internal Medicine

## 2014-02-09 VITALS — BP 132/88 | HR 56 | Temp 97.9°F | Wt 181.4 lb

## 2014-02-09 DIAGNOSIS — R739 Hyperglycemia, unspecified: Secondary | ICD-10-CM

## 2014-02-09 DIAGNOSIS — J029 Acute pharyngitis, unspecified: Secondary | ICD-10-CM

## 2014-02-09 DIAGNOSIS — I10 Essential (primary) hypertension: Secondary | ICD-10-CM

## 2014-02-09 DIAGNOSIS — E782 Mixed hyperlipidemia: Secondary | ICD-10-CM

## 2014-02-09 DIAGNOSIS — M109 Gout, unspecified: Secondary | ICD-10-CM

## 2014-02-09 DIAGNOSIS — R7309 Other abnormal glucose: Secondary | ICD-10-CM

## 2014-02-09 NOTE — Progress Notes (Signed)
Pre visit review using our clinic review tool, if applicable. No additional management support is needed unless otherwise documented below in the visit note. 

## 2014-02-09 NOTE — Progress Notes (Signed)
   Subjective:    Patient ID: Juan [name], male    DOB: March 06, 1936, 78 y.o.   MRN: [id number]  HPI   He is here for followup of his diabetes, dyslipidemia, and hypertension.  He has been compliant with his medications; he denies any adverse effects.  His fasting blood sugars have ranged from 98-172; 2 hours after lunch 109-188; and 2 hours after supper 132-163.  He has no hypoglycemia.  Ophthalmologic evaluation is up-to-date. He has not seen a podiatrist.  He does not monitor his blood pressure at home.  Lipids are at goal except for an HDL of 34.1.  Uric acid is high normal at 7.4.  Despite combined low dose statin & fenofibrate; CK is normal.  He is asymptomatic except for slight scratchy throat this morning upon awakening. He denies any symptoms of upper respiratory tract infection.    Review of Systems  All below NEGATIVE: Chest pain, palpitations       Dyspnea Edema Claudication  Lightheadedness,Syncope Weight change Polyuria/phagia/dipsia    Blurred vision /diplopia/lossof vision Limb numbness/tingling/burning Non healing skin lesions Abd pain, bowel changes   Myalgias   Frontal headache, facial pain , nasal purulence, dental pain, sore throat , otic pain or otic discharge denied. No fever , chills or sweats.     Objective:   Physical Exam   Positive or pertinent findings include: He has the cochlear implant device behind the right ear. Hearing is profoundly compromised. Some hypo-nasal speech quality to his voice Gait is slightly broad. Thickened toenails.   Appears healthy and well-nourished & in no acute distress No carotid bruits are present.No neck pain distention present at 10 - 15 degrees. Thyroid normal to palpation Heart rhythm and rate are normal with no gallop or murmur Chest is clear with no increased work of breathing There is no evidence of aortic aneurysm or renal artery bruits Abdomen soft with no organomegaly or masses. No HJR No  clubbing, cyanosis or edema present. Pedal pulses are intact  No ischemic skin changes are present . Fingernails healthy  Alert and oriented. Strength, tone, DTRs reflexes normal         Assessment & Plan:  See Current Assessment & Plan in Problem List under specific Diagnosis  #2 sore throat with negative  Centor criteria

## 2014-02-09 NOTE — Assessment & Plan Note (Signed)
No change in meds.

## 2014-02-09 NOTE — Assessment & Plan Note (Signed)
A1c 6 mos

## 2014-02-09 NOTE — Assessment & Plan Note (Addendum)
HFCS sugar intake relationship discussed No change in Allopurinol dose because of PMH of renal insufficiency (mild)

## 2014-02-09 NOTE — Patient Instructions (Signed)
Zicam Melts or Zinc lozenges as per package label for sore throat . Complementary options include  vitamin C 2000 mg daily; & Echinacea for 4-7 days. Report persistent or progressive fever; discolored nasal or chest secretions; or frontal headache or facial  Pain.   To prevent gout the minimal uric acid goal is < 7; preferred is < 6, ideal or protective value is < 5 .  The most common cause of elevated uric acid AND Triglycerides  is the ingestion of sugar from high fructose corn syrup sources in processed foods & drinks. You should consume less than 40 grams (ideally ZERO) of sugar per day from foods and drinks with high fructose corn syrup as number 1,2, 3, or #4 on the label. Chronic elevation of uric acid is not an isolated arthritic issue;it also increases cardiac & kidney disease risk.

## 2014-02-10 ENCOUNTER — Other Ambulatory Visit: Payer: Self-pay | Admitting: Internal Medicine

## 2014-02-10 ENCOUNTER — Telehealth: Payer: Self-pay | Admitting: Internal Medicine

## 2014-02-10 DIAGNOSIS — N259 Disorder resulting from impaired renal tubular function, unspecified: Secondary | ICD-10-CM

## 2014-02-10 DIAGNOSIS — R7309 Other abnormal glucose: Secondary | ICD-10-CM

## 2014-02-10 DIAGNOSIS — M109 Gout, unspecified: Secondary | ICD-10-CM

## 2014-02-10 DIAGNOSIS — E782 Mixed hyperlipidemia: Secondary | ICD-10-CM

## 2014-02-10 NOTE — Assessment & Plan Note (Signed)
Blood pressure goals reviewed. BMET @ next appt

## 2014-02-10 NOTE — Telephone Encounter (Signed)
Relevant patient education assigned to patient using Emmi. ° °

## 2014-03-06 ENCOUNTER — Other Ambulatory Visit: Payer: Self-pay | Admitting: Cardiovascular Disease

## 2014-03-16 ENCOUNTER — Ambulatory Visit (INDEPENDENT_AMBULATORY_CARE_PROVIDER_SITE_OTHER): Payer: Federal, State, Local not specified - PPO | Admitting: *Deleted

## 2014-03-16 DIAGNOSIS — Z23 Encounter for immunization: Secondary | ICD-10-CM

## 2014-04-12 ENCOUNTER — Other Ambulatory Visit: Payer: Self-pay | Admitting: Cardiovascular Disease

## 2014-04-19 ENCOUNTER — Other Ambulatory Visit: Payer: Self-pay | Admitting: Internal Medicine

## 2014-04-29 ENCOUNTER — Other Ambulatory Visit: Payer: Self-pay | Admitting: Cardiovascular Disease

## 2014-04-30 NOTE — Telephone Encounter (Signed)
Should this be sent to pcp? Please advise. Thanks, MI

## 2014-05-19 ENCOUNTER — Other Ambulatory Visit: Payer: Self-pay | Admitting: Cardiovascular Disease

## 2014-06-25 ENCOUNTER — Other Ambulatory Visit: Payer: Self-pay | Admitting: Cardiovascular Disease

## 2014-06-29 ENCOUNTER — Encounter: Payer: Self-pay | Admitting: Cardiovascular Disease

## 2014-06-29 ENCOUNTER — Ambulatory Visit (INDEPENDENT_AMBULATORY_CARE_PROVIDER_SITE_OTHER): Payer: Federal, State, Local not specified - PPO | Admitting: Cardiovascular Disease

## 2014-06-29 VITALS — BP 125/68 | HR 68 | Ht 71.0 in | Wt 180.0 lb

## 2014-06-29 DIAGNOSIS — I10 Essential (primary) hypertension: Secondary | ICD-10-CM

## 2014-06-29 DIAGNOSIS — E782 Mixed hyperlipidemia: Secondary | ICD-10-CM

## 2014-06-29 MED ORDER — PRAVASTATIN SODIUM 20 MG PO TABS: 20.0000 mg | ORAL_TABLET | Freq: Every day | ORAL | Status: DC

## 2014-06-29 MED ORDER — METOPROLOL TARTRATE 50 MG PO TABS: 50.0000 mg | ORAL_TABLET | Freq: Two times a day (BID) | ORAL | Status: DC

## 2014-06-29 NOTE — Patient Instructions (Addendum)
Your physician wants you to follow-up in: YEAR WITH DR NISHAN  You will receive a reminder letter in the mail two months in advance. If you don't receive a letter, please call our office to schedule the follow-up appointment.  Your physician recommends that you continue on your current medications as directed. Please refer to the Current Medication list given to you today. 

## 2014-06-29 NOTE — Assessment & Plan Note (Signed)
Well controlled.  Continue current medications and low sodium Dash type diet.    

## 2014-06-29 NOTE — Assessment & Plan Note (Signed)
Cholesterol is at goal.  Continue current dose of statin and diet Rx.  No myalgias or side effects.  F/U  LFT's in 6 months. Lab Results  Component Value Date   LDLCALC 90 02/04/2014

## 2014-06-29 NOTE — Progress Notes (Signed)
Patient ID: Juan [name], male   DOB: 10/26/1935, 79 y.o.   MRN: [id number] Juan [name] is a 79 y.o.  male with a hx of CAD, s/p CABG in 2000, HTN, HL, deafness s/p cochlear implant. Last myoview 9/08: inf-septal scar with mild peri-infarct ischemia (low risk). He was seen in the ED with acute onset of imbalance in 01/2012. Seen by neurology. Head CT (MRI not done b/c of cochlear implant) was neg. Carotid U/S: no ICA stenosis. Echo 8/13: mild focal basal septal hypertrophy, Gr 1 DD, mild LAE. Saw neurology in f/u and episode was felt to be either dehydration or a TIA. ASA was continued.  Patient was recently admitted to the hospital 1/2-1/3 with syncope. His syncopal episode occurred while lifting weights. Syncopal episode was accompanied by nausea and vomiting. Head CT demonstrated no acute findings. Carotid US (06/20/2013): 1-39% bilateral ICA stenosis. Echo (06/20/2011): EF 50-55%, normal wall motion, grade 1 diastolic dysfunction, mild LAE. He was evaluated by [place]  Symptoms sound more vasovagal than cardiac in nature. Lexiscan Myoview (06/20/2013): No reversible ischemia, inferior and septal infarct, inferior and septal hypokinesis, EF 60%. Outpatient event monitor was recommended.   He is doing well. No further syncope. He denies any chest pain, dyspnea, orthopnea, PND, edema. Exercising 3x/week at [place] [facility] near [place]: Denies fever, malais, weight loss, blurry vision, decreased visual acuity, cough, sputum, SOB, hemoptysis, pleuritic pain, palpitaitons, heartburn, abdominal pain, melena, lower extremity edema, claudication, or rash.  All other systems reviewed and negative  General: Affect appropriate Healthy:  appears stated age 79: choclear implant  Neck supple with no adenopathy JVP normal no bruits no thyromegaly Lungs clear with no wheezing and good diaphragmatic motion Heart:  S1/S2 SEM  murmur, no rub, gallop or click PMI normal Abdomen: benighn, BS positve, no  tenderness, no AAA no bruit.  No HSM or HJR Distal pulses intact with no bruits No edema Neuro non-focal Skin warm and dry No muscular weakness   Current Outpatient Prescriptions  Medication Sig Dispense Refill  . allopurinol (ZYLOPRIM) 100 MG tablet TAKE 1 TABLET BY MOUTH DAILY 90 tablet 1  . aspirin EC 81 MG tablet Take 81 mg by mouth daily.    . Choline Fenofibrate (FENOFIBRIC ACID) 135 MG CPDR TAKE 1 CAPSULE BY MOUTH EVERY DAY 30 capsule 5  . finasteride (PROSCAR) 5 MG tablet Take 5 mg by mouth daily.    . metoprolol (LOPRESSOR) 50 MG tablet TAKE 1 TABLET BY MOUTH TWICE DAILY 30 tablet 0  . pravastatin (PRAVACHOL) 20 MG tablet TAKE ONE TABLET BY MOUTH DAILY 30 tablet 0   No current facility-administered medications for this visit.    Allergies  Simvastatin  Electrocardiogram:  SR rate 61 nonspecific St T wave changes 08/25/13  SR rate 68 low voltage nonspecific ST changes no difference 06/29/14  Assessment and Plan

## 2014-06-29 NOTE — Assessment & Plan Note (Signed)
Stable with no angina and good activity level.  Continue medical Rx  

## 2014-06-29 NOTE — Assessment & Plan Note (Signed)
Resolved normal rhythm not postural vasovagal etiology suspected

## 2014-07-05 ENCOUNTER — Other Ambulatory Visit: Payer: Self-pay | Admitting: Internal Medicine

## 2014-08-27 ENCOUNTER — Other Ambulatory Visit: Payer: Self-pay | Admitting: Dermatology

## 2014-09-05 ENCOUNTER — Encounter: Payer: Self-pay | Admitting: Internal Medicine

## 2014-09-05 DIAGNOSIS — D239 Other benign neoplasm of skin, unspecified: Secondary | ICD-10-CM | POA: Insufficient documentation

## 2014-09-20 ENCOUNTER — Ambulatory Visit (INDEPENDENT_AMBULATORY_CARE_PROVIDER_SITE_OTHER): Payer: Federal, State, Local not specified - PPO | Admitting: Internal Medicine

## 2014-09-20 ENCOUNTER — Other Ambulatory Visit (INDEPENDENT_AMBULATORY_CARE_PROVIDER_SITE_OTHER): Payer: Federal, State, Local not specified - PPO

## 2014-09-20 ENCOUNTER — Encounter: Payer: Self-pay | Admitting: Internal Medicine

## 2014-09-20 VITALS — BP 152/100 | HR 82 | Temp 97.9°F | Ht 71.0 in | Wt 185.8 lb

## 2014-09-20 DIAGNOSIS — I10 Essential (primary) hypertension: Secondary | ICD-10-CM

## 2014-09-20 DIAGNOSIS — E782 Mixed hyperlipidemia: Secondary | ICD-10-CM | POA: Diagnosis not present

## 2014-09-20 DIAGNOSIS — Z8739 Personal history of other diseases of the musculoskeletal system and connective tissue: Secondary | ICD-10-CM

## 2014-09-20 DIAGNOSIS — R739 Hyperglycemia, unspecified: Secondary | ICD-10-CM

## 2014-09-20 DIAGNOSIS — Z8639 Personal history of other endocrine, nutritional and metabolic disease: Secondary | ICD-10-CM | POA: Diagnosis not present

## 2014-09-20 LAB — BASIC METABOLIC PANEL
BUN: 30 mg/dL — ABNORMAL HIGH (ref 6–23)
CO2: 28 mEq/L (ref 19–32)
Calcium: 10.1 mg/dL (ref 8.4–10.5)
Chloride: 106 mEq/L (ref 96–112)
Creatinine, Ser: 1.22 mg/dL (ref 0.40–1.50)
GFR: 61.28 mL/min (ref >60.00)
Glucose, Bld: 161 mg/dL — ABNORMAL HIGH (ref 70–99)
Potassium: 4 mEq/L (ref 3.5–5.1)
SODIUM: 138 meq/L (ref 135–145)

## 2014-09-20 LAB — MICROALBUMIN / CREATININE URINE RATIO
CREATININE, U: 129.6 mg/dL
MICROALB/CREAT RATIO: 0.8 mg/g (ref 0.0–30.0)
Microalb, Ur: 1 mg/dL (ref 0.0–1.9)

## 2014-09-20 LAB — HEMOGLOBIN A1C: Hgb A1c MFr Bld: 6.4 % (ref 4.6–6.5)

## 2014-09-20 LAB — HEPATIC FUNCTION PANEL
ALK PHOS: 49 U/L (ref 39–117)
ALT: 15 U/L (ref 0–53)
AST: 18 U/L (ref 0–37)
Albumin: 4 g/dL (ref 3.5–5.2)
Bilirubin, Direct: 0.1 mg/dL (ref 0.0–0.3)
TOTAL PROTEIN: 6.8 g/dL (ref 6.0–8.3)
Total Bilirubin: 0.4 mg/dL (ref 0.2–1.2)

## 2014-09-20 LAB — LIPID PANEL
CHOL/HDL RATIO: 4
Cholesterol: 152 mg/dL (ref 0–200)
HDL: 34.7 mg/dL — ABNORMAL LOW (ref >39.00)
NONHDL: 117.3
TRIGLYCERIDES: 256 mg/dL — AB (ref 0.0–149.0)
VLDL: 51.2 mg/dL — ABNORMAL HIGH (ref 0.0–40.0)

## 2014-09-20 LAB — CK: Total CK: 138 U/L (ref 7–232)

## 2014-09-20 LAB — URIC ACID: Uric Acid, Serum: 7.2 mg/dL (ref 4.0–7.8)

## 2014-09-20 LAB — LDL CHOLESTEROL, DIRECT: LDL DIRECT: 94 mg/dL

## 2014-09-20 LAB — TSH: TSH: 1.7 u[IU]/mL (ref 0.35–4.50)

## 2014-09-20 NOTE — Patient Instructions (Signed)
  Your next office appointment will be determined based upon review of your pending labs Those instructions will be transmitted to you by mail for your records.  Critical results will be called.   Followup as needed for any active or acute issue. Please report any significant change in your symptoms. 

## 2014-09-20 NOTE — Progress Notes (Signed)
   Subjective:    Patient ID: Juan [name], male    DOB: 1936/03/21, 79 y.o.   MRN: [id number]  HPI The patient is here to assess status of active health conditions.  PMH, FH, & Social History reviewed & updated.  He has been compliant with his medications without adverse effects.  He is checking his fasting blood sugar approximately once a month. The range is 129-145. He does restrict hyperglycemic carbs. He is on a heart healthy diet except for occasional fried foods. He is exercising 45 minutes 3 times a week as per the [facility] program without associated cardiopulmonary symptoms. He's not monitoring his blood pressure at home. He smokes 1 cigar every other day. Ophthalmologic exam was last week; he had no diabetic retinopathy. He has not had a podiatry evaluation but does see his dermatologist.  Review of Systems   Chest pain, palpitations, tachycardia, exertional dyspnea, paroxysmal nocturnal dyspnea, claudication or edema are absent.  Polyuria, polyphagia, polydipsia absent.  There is no blurred vision, double vision, or loss of vision.   No postural dizziness noted. Denied are numbness, tingling, or burning of the extremities.  No nonhealing skin lesions present.  Weight is stable. Abdominal pain, significant dyspepsia, dysphagia, melena, rectal bleeding, or persistently small caliber stools are denied. No gout recurrence reported.     Objective:   Physical Exam  Pertinent or positive findings include: He has a cochlear implant device on the right. There is still some hearing deficit. Hyponasal speech pattern present. The first heart sound is accentuated.  Ventral hernias present.  He has crepitus of the knees.  Dorsalis pedis pulses are decreased Board , slow shuffling gait present.  General appearance :adequately nourished; in no distress. Eyes: No conjunctival inflammation or scleral icterus is present. Oral exam:  Lips and gums are healthy appearing.There is no  oropharyngeal erythema or exudate noted. Dental hygiene is good. Heart:  Normal rate and regular rhythm. S2 normal without gallop, murmur, click, rub or other extra sounds   Lungs:Chest clear to auscultation; no wheezes, rhonchi,rales ,or rubs present.No increased work of breathing.  Abdomen: bowel sounds normal, soft and non-tender without masses, or organomegaly  is noted.  No guarding or rebound.  Vascular : all pulses equal ; no bruits present. Skin:Warm & dry.  Intact without suspicious lesions or rashes ; no tenting or jaundice  Lymphatic: No lymphadenopathy is noted about the head, neck, axilla Neuro: Strength, tone & DTRs normal.        Assessment & Plan:  See Current Assessment & Plan in Problem List under specific Diagnosis

## 2014-09-20 NOTE — Assessment & Plan Note (Signed)
Lipids, LFTs, TSH ,CK 

## 2014-09-20 NOTE — Assessment & Plan Note (Signed)
Blood pressure goals reviewed. BMET 

## 2014-09-20 NOTE — Progress Notes (Signed)
Pre visit review using our clinic review tool, if applicable. No additional management support is needed unless otherwise documented below in the visit note. 

## 2014-09-20 NOTE — Assessment & Plan Note (Signed)
A1c , urine microalbumin, BMET 

## 2014-09-20 NOTE — Assessment & Plan Note (Signed)
Uric acid

## 2014-11-17 ENCOUNTER — Other Ambulatory Visit: Payer: Self-pay

## 2014-11-17 MED ORDER — ALLOPURINOL 100 MG PO TABS: 100.0000 mg | ORAL_TABLET | Freq: Every day | ORAL | Status: DC

## 2014-12-28 ENCOUNTER — Emergency Department (HOSPITAL_COMMUNITY): Payer: Federal, State, Local not specified - PPO

## 2014-12-28 ENCOUNTER — Encounter (HOSPITAL_COMMUNITY): Payer: Self-pay | Admitting: Emergency Medicine

## 2014-12-28 ENCOUNTER — Emergency Department (HOSPITAL_COMMUNITY)
Admission: EM | Admit: 2014-12-28 | Discharge: 2014-12-28 | Disposition: A | Discharge status: Home / Self Care | Payer: Federal, State, Local not specified - PPO | Attending: Emergency Medicine | Admitting: Emergency Medicine

## 2014-12-28 DIAGNOSIS — Z9889 Other specified postprocedural states: Secondary | ICD-10-CM | POA: Diagnosis not present

## 2014-12-28 DIAGNOSIS — Z87718 Personal history of other specified (corrected) congenital malformations of genitourinary system: Secondary | ICD-10-CM | POA: Diagnosis not present

## 2014-12-28 DIAGNOSIS — Y939 Activity, unspecified: Secondary | ICD-10-CM | POA: Insufficient documentation

## 2014-12-28 DIAGNOSIS — E119 Type 2 diabetes mellitus without complications: Secondary | ICD-10-CM | POA: Diagnosis not present

## 2014-12-28 DIAGNOSIS — Z7982 Long term (current) use of aspirin: Secondary | ICD-10-CM | POA: Insufficient documentation

## 2014-12-28 DIAGNOSIS — Z8739 Personal history of other diseases of the musculoskeletal system and connective tissue: Secondary | ICD-10-CM | POA: Insufficient documentation

## 2014-12-28 DIAGNOSIS — Z86018 Personal history of other benign neoplasm: Secondary | ICD-10-CM | POA: Insufficient documentation

## 2014-12-28 DIAGNOSIS — Z8673 Personal history of transient ischemic attack (TIA), and cerebral infarction without residual deficits: Secondary | ICD-10-CM | POA: Insufficient documentation

## 2014-12-28 DIAGNOSIS — Y999 Unspecified external cause status: Secondary | ICD-10-CM | POA: Diagnosis not present

## 2014-12-28 DIAGNOSIS — Y9289 Other specified places as the place of occurrence of the external cause: Secondary | ICD-10-CM | POA: Insufficient documentation

## 2014-12-28 DIAGNOSIS — S42201A Unspecified fracture of upper end of right humerus, initial encounter for closed fracture: Secondary | ICD-10-CM

## 2014-12-28 DIAGNOSIS — K219 Gastro-esophageal reflux disease without esophagitis: Secondary | ICD-10-CM | POA: Diagnosis not present

## 2014-12-28 DIAGNOSIS — Z79899 Other long term (current) drug therapy: Secondary | ICD-10-CM | POA: Insufficient documentation

## 2014-12-28 DIAGNOSIS — Z8701 Personal history of pneumonia (recurrent): Secondary | ICD-10-CM | POA: Insufficient documentation

## 2014-12-28 DIAGNOSIS — Z720 Tobacco use: Secondary | ICD-10-CM | POA: Diagnosis not present

## 2014-12-28 DIAGNOSIS — S4991XA Unspecified injury of right shoulder and upper arm, initial encounter: Secondary | ICD-10-CM | POA: Diagnosis present

## 2014-12-28 DIAGNOSIS — Z951 Presence of aortocoronary bypass graft: Secondary | ICD-10-CM | POA: Insufficient documentation

## 2014-12-28 DIAGNOSIS — S80212A Abrasion, left knee, initial encounter: Secondary | ICD-10-CM | POA: Diagnosis not present

## 2014-12-28 DIAGNOSIS — N189 Chronic kidney disease, unspecified: Secondary | ICD-10-CM | POA: Insufficient documentation

## 2014-12-28 DIAGNOSIS — W108XXA Fall (on) (from) other stairs and steps, initial encounter: Secondary | ICD-10-CM | POA: Diagnosis not present

## 2014-12-28 DIAGNOSIS — I2510 Atherosclerotic heart disease of native coronary artery without angina pectoris: Secondary | ICD-10-CM | POA: Diagnosis not present

## 2014-12-28 DIAGNOSIS — S80211A Abrasion, right knee, initial encounter: Secondary | ICD-10-CM | POA: Diagnosis not present

## 2014-12-28 DIAGNOSIS — I129 Hypertensive chronic kidney disease with stage 1 through stage 4 chronic kidney disease, or unspecified chronic kidney disease: Secondary | ICD-10-CM | POA: Insufficient documentation

## 2014-12-28 LAB — BASIC METABOLIC PANEL
Anion gap: 10 (ref 5–15)
BUN: 37 mg/dL — ABNORMAL HIGH (ref 6–20)
CO2: 22 mmol/L (ref 22–32)
CREATININE: 1.35 mg/dL — AB (ref 0.61–1.24)
Calcium: 10.1 mg/dL (ref 8.9–10.3)
Chloride: 107 mmol/L (ref 101–111)
GFR calc non Af Amer: 49 mL/min — ABNORMAL LOW (ref >60)
GFR, EST AFRICAN AMERICAN: 57 mL/min — AB (ref >60)
Glucose, Bld: 233 mg/dL — ABNORMAL HIGH (ref 65–99)
Potassium: 3.7 mmol/L (ref 3.5–5.1)
Sodium: 139 mmol/L (ref 135–145)

## 2014-12-28 LAB — CBC WITH DIFFERENTIAL/PLATELET
Basophils Absolute: 0 10*3/uL (ref 0.0–0.1)
Basophils Relative: 0 % (ref 0–1)
EOS PCT: 0 % (ref 0–5)
Eosinophils Absolute: 0 10*3/uL (ref 0.0–0.7)
HCT: 39.9 % (ref 39.0–52.0)
HEMOGLOBIN: 13.3 g/dL (ref 13.0–17.0)
LYMPHS PCT: 7 % — AB (ref 12–46)
Lymphs Abs: 0.6 10*3/uL — ABNORMAL LOW (ref 0.7–4.0)
MCH: 29.7 pg (ref 26.0–34.0)
MCHC: 33.3 g/dL (ref 30.0–36.0)
MCV: 89.1 fL (ref 78.0–100.0)
Monocytes Absolute: 0.8 10*3/uL (ref 0.1–1.0)
Monocytes Relative: 9 % (ref 3–12)
NEUTROS PCT: 84 % — AB (ref 43–77)
Neutro Abs: 7.4 10*3/uL (ref 1.7–7.7)
Platelets: 203 10*3/uL (ref 150–400)
RBC: 4.48 MIL/uL (ref 4.22–5.81)
RDW: 13.6 % (ref 11.5–15.5)
WBC: 8.8 10*3/uL (ref 4.0–10.5)

## 2014-12-28 MED ORDER — HYDROCODONE-ACETAMINOPHEN 5-325 MG PO TABS: 2.0000 | ORAL_TABLET | ORAL | Status: DC | PRN

## 2014-12-28 MED ORDER — FENTANYL CITRATE (PF) 100 MCG/2ML IJ SOLN
50.0000 ug | Freq: Once | INTRAMUSCULAR | Status: AC
  Administered 2014-12-28: 50 ug via INTRAVENOUS
  Filled 2014-12-28: qty 2

## 2014-12-28 NOTE — ED Notes (Signed)
Pt states that he missed the third step and fell.  C/o rt shoulder pain.  Denies LOC.

## 2014-12-28 NOTE — ED Provider Notes (Signed)
CSN: [id number]     Arrival date & time 12/28/14  1526 History   First MD Initiated Contact with Patient 12/28/14 1707     Chief Complaint  Patient presents with  . Fall  . Shoulder Pain     (Consider location/radiation/quality/duration/timing/severity/associated sxs/prior Treatment) Patient is a 79 y.o. male presenting with fall and shoulder pain. The history is provided by the patient.  Fall This is a new problem. Pertinent negatives include no chest pain, no abdominal pain, no headaches and no shortness of breath.  Shoulder Pain Associated symptoms: no back pain    patient lost his balance and fell down 3 stairs, landing on his right shoulder. He also scraped his knees but states it is really only shoulder is bothering him. No loss conscious no headache. No confusion. He is not on anticoagulation. He did eat some lunch today.  Past Medical History  Diagnosis Date  . Hyperlipidemia   . Duodenitis   . Gout   . Diverticular disease   . Elevated PSA   . Tubular adenoma polyp of rectum   . Pancreatitis     a. ? Simvastatin related  . GERD (gastroesophageal reflux disease)   . Hydronephrosis with ureteropelvic junction obstruction   . Hypertension   . TIA (transient ischemic attack)     a. 01/2012 - presentation as acute imbalance;  b. 01/2012 carotid U/S w/o signif stenosis;  c. 01/2012 Echo: EF 60-65%, Gr 1 DD.;  d.  Carotid US (06/20/2013): 1-39% bilateral ICA stenosis  . Hypercalcemia 02/02/12    a. 01/2012: 10.7.  Marland Kitchen Syncope     a. 06/2013  . Pneumonia     "I think only once" (06/19/2013)  . Type II diabetes mellitus     "diet and exercise controlled at present" (06/19/2013)  . Coronary artery disease     a. s/p CABG 2000; b. myoview 9/08: inf-septal scar with mild peri-infarct ischemia (low risk);  c.  Echo 8/13: mild focal basal septal hypertrophy, Gr 1 DD, mild LAE;  d. Lexiscan Myoview (06/20/2013): No reversible ischemia, inferior and septal infarct, inferior and septal hypokinesis,  EF 60%;  e.  Echo (06/20/2011): EF 50-55%, normal wall motion, grade 1 diastolic dysfunction, mild LAE  . CKD (chronic kidney disease) 06/24/2013   Past Surgical History  Procedure Laterality Date  . Cochlear implant Right 1990's  . Appendectomy    . Cholecystectomy    . Colonoscopy w/ polypectomy      no polyp 2012  . Parathyroidectomy  1978  . Polypectomy    . Cardiac catheterization  2000  . Coronary artery bypass graft  2000    CABG X5  . Tonsillectomy  1943  . Ankle surgery Right ~ 1943    "don't know what for"   Family History  Problem Relation Age of Onset  . Melanoma Father   . Heart attack Father      in 35s  . Diabetes Neg Hx    History  Substance Use Topics  . Smoking status: Current Some Day Smoker -- 0 years    Types: Cigars  . Smokeless tobacco: Never Used     Comment: 06/19/2013 "rare cigar"  . Alcohol Use: Yes     Comment: 06/19/2013 "might have a drink once/month"    Review of Systems  Constitutional: Negative for activity change and appetite change.  Eyes: Negative for pain.  Respiratory: Negative for chest tightness and shortness of breath.   Cardiovascular: Negative for chest pain and leg  swelling.  Gastrointestinal: Negative for nausea, vomiting, abdominal pain and diarrhea.  Genitourinary: Negative for flank pain.  Musculoskeletal: Positive for joint swelling. Negative for back pain and neck stiffness.       Right shoulder tenderness and pain.  Skin: Negative for rash.  Neurological: Negative for weakness, numbness and headaches.  Psychiatric/Behavioral: Negative for behavioral problems.      Allergies  Simvastatin  Home Medications   Prior to Admission medications   Medication Sig Start Date End Date Taking? Authorizing Provider  allopurinol (ZYLOPRIM) 100 MG tablet Take 1 tablet (100 mg total) by mouth daily. 11/17/14  Yes Hendricks Limes, MD  aspirin EC 81 MG tablet Take 81 mg by mouth daily.   Yes Historical Provider, MD  Choline  Fenofibrate (FENOFIBRIC ACID) 135 MG CPDR TAKE ONE CAPSULE BY MOUTH EVERY DAY Patient taking differently: TAKE 135 MG BY MOUTH DAILY 07/05/14  Yes Hendricks Limes, MD  finasteride (PROSCAR) 5 MG tablet Take 5 mg by mouth daily.   Yes Historical Provider, MD  metoprolol (LOPRESSOR) 50 MG tablet Take 1 tablet (50 mg total) by mouth 2 (two) times daily. 06/29/14  Yes [name], MD  pravastatin (PRAVACHOL) 20 MG tablet Take 1 tablet (20 mg total) by mouth daily. Patient taking differently: Take 20 mg by mouth. Take Mon Wed Fri 06/29/14  Yes [name], MD  HYDROcodone-acetaminophen (NORCO/VICODIN) 5-325 MG per tablet Take 2 tablets by mouth every 4 (four) hours as needed. 12/28/14   Davonna Belling, MD   BP 150/81 mmHg  Pulse 76  Temp(Src) 98.5 F (36.9 C) (Oral)  Resp 16  SpO2 97% Physical Exam  Constitutional: He appears well-developed and well-nourished.  HENT:  Head: Normocephalic.  Cochlear implant on right  Eyes: Pupils are equal, round, and reactive to light.  Cardiovascular: Normal rate.   Pulmonary/Chest: Effort normal.  Abdominal: Soft.  Musculoskeletal: Normal range of motion.  Mild abrasions to bilateral knees without redness. Tenderness and deformity to right shoulder. Skin intact. No tenderness over elbow or wrist. Neurovascular intact over right hand in radial median and ulnar distribution. Strong radial pulse. Sensation intact over the deltoid  Neurological: He is alert.  Skin: Skin is warm.    ED Course  Procedures (including critical care time) Labs Review Labs Reviewed  BASIC METABOLIC PANEL - Abnormal; Notable for the following:    Glucose, Bld 233 (*)    BUN 37 (*)    Creatinine, Ser 1.35 (*)    GFR calc non Af Amer 49 (*)    GFR calc Af Amer 57 (*)    All other components within normal limits  CBC WITH DIFFERENTIAL/PLATELET - Abnormal; Notable for the following:    Neutrophils Relative % 84 (*)    Lymphocytes Relative 7 (*)    Lymphs Abs 0.6 (*)     All other components within normal limits    Imaging Review Dg Shoulder Right  12/28/2014   CLINICAL DATA:  Fall off of steps with injury to right shoulder. Initial encounter.  EXAM: RIGHT SHOULDER - 2+ VIEW  COMPARISON:  None.  FINDINGS: Severely comminuted fracture of the proximal humerus noted with significant displacement at the level of the surgical neck as well as a comminuted fracture extending into the region of the greater tuberosity of the humeral head. It is difficult to determine if there is an associated glenoid fracture. The [facility] joint shows normal alignment. Overlying soft tissue swelling present.  IMPRESSION: Severely comminuted fracture of the  proximal humerus with significant displacement at the level of the surgical neck. Comminuted fracture planes extend through the region of the greater tuberosity. CT evaluation may be helpful for surgical planning.   Electronically Signed   By: Aletta Edouard M.D.   On: 12/28/2014 16:20   Ct Head Wo Contrast  12/28/2014   CLINICAL DATA:  Fall on steps.  No loss of consciousness.  EXAM: CT HEAD WITHOUT CONTRAST  TECHNIQUE: Contiguous axial images were obtained from the base of the skull through the vertex without intravenous contrast.  COMPARISON:  CT scan of June 19, 2013.  FINDINGS: Bony calvarium appears intact. Cochlear implant is noted on the right with substantial artifact arising from implant over right parietal skull. No mass effect or midline shift is noted. Ventricular size is within normal limits. There is no evidence of mass lesion, hemorrhage or acute infarction.  IMPRESSION: Exam is somewhat limited due to substantial artifact arising from right-sided cochlear implant. No definite intracranial abnormality seen.   Electronically Signed   By: Marijo Conception, M.D.   On: 12/28/2014 19:23   Ct Shoulder Right Wo Contrast  12/28/2014   CLINICAL DATA:  79 year old male status post fall and right shoulder pains all  EXAM: CT OF THE RIGHT  SHOULDER WITHOUT CONTRAST  TECHNIQUE: Multidetector CT imaging was performed according to the standard protocol. Multiplanar CT image reconstructions were also generated.  COMPARISON:  Radiograph dated 12/28/2014  FINDINGS: There is comminuted fracture of the right humeral head. There is multi fragmentary fracture of the neck of the right humerus extending into the proximal humeral diaphysis. There is displacement of the humerus at the surgical neck. There is lateral tilting of the distal end of the neck of the humerus at the fracture site. There is diffuse soft tissue swelling and hematoma in the right shoulder.  IMPRESSION: Comminuted and displaced fractures of the head and neck of right humerus.   Electronically Signed   By: Anner Crete M.D.   On: 12/28/2014 20:05   Dg Chest Portable 1 View  12/28/2014   CLINICAL DATA:  79 year old male post fall and right shoulder fracture. Subsequent encounter.  EXAM: PORTABLE CHEST - 1 VIEW  COMPARISON:  12/28/2014 shoulder films.  06/19/2013 chest x-ray.  FINDINGS: Post CABG.  Calcified tortuous with aorta with tortuosity having increased from the prior exam.  Right base scarring/subsegmental atelectasis.  No gross pneumothorax or segmental consolidation.  No pulmonary edema.  No plain film evidence of pulmonary malignancy.  No obvious right-sided rib fracture.  Right shoulder fracture better demonstrated on earlier films.  IMPRESSION: Tortuous aorta.  Post CABG.  Right base subsegmental atelectasis/ scarring.   Electronically Signed   By: Genia Del M.D.   On: 12/28/2014 18:19     EKG Interpretation   Date/Time:  Tuesday December 28 2014 18:11:05 EDT Ventricular Rate:  81 PR Interval:  159 QRS Duration: 98 QT Interval:  373 QTC Calculation: 433 R Axis:   -55 Text Interpretation:  Sinus rhythm Left anterior fascicular block Anterior  infarct, old Borderline repolarization abnormality Confirmed by Alvino Chapel   MD, Olie Dibert [phone]) on 12/29/2014 12:58:19 AM       MDM   Final diagnoses:  Proximal humerus fracture, right, closed, initial encounter    Patient with fall. Proximal humerus fracture. No other severe injury. Discussed with orthopedic surgery who will see the patient in follow-up in 2 days. Likely surgery by different provider. Will discharge home.    Davonna Belling, MD 12/29/14 [phone]

## 2014-12-28 NOTE — ED Notes (Signed)
Patient transported to CT 

## 2014-12-28 NOTE — Discharge Instructions (Signed)
Shoulder Fracture (Proximal Humerus or Glenoid) °A shoulder fracture is a broken upper arm bone or a broken socket bone. The humerus is the upper arm bone and the glenoid is the shoulder socket. Proximal means the humerus is broken near the shoulder. Most of the time the bones of a broken shoulder are in an acceptable position. Usually, the injury can be treated with a shoulder immobilizer or sling and swath bandage. These devices support the arm and prevent any shoulder movement. If the bones are not in a good position, then surgery is sometimes needed. Shoulder fractures usually initially cause swelling, pain, and discoloration around the upper arm. They heal in 8 to 12 weeks with proper treatment. °SYMPTOMS  °At the time of injury: °· Pain. °· Tenderness. °· Regular body contours are not normal. °Later symptoms may include: °· Swelling and bruising of the elbow and hand. °· Swelling and bruising of the arm or chest. °Other symptoms include: °· Pain when lifting or turning the arm. °· Paralysis below the fracture. °· Numbness or coldness below the fracture. °CAUSES  °· Indirect force from falling on an outstretched arm. °· A blow to the shoulder. °RISK INCREASES WITH: °· Not being in shape. °· Playing contact sports, such as football, soccer, hockey, or rugby. °· Sports where falling on an outstretched arm occurs, such as basketball, skateboarding, or volleyball. °· History of bone or joint disease. °· History of shoulder injury. °PREVENTION °· Warm up before activity. °· Stretch before activity. °· Stay in shape with your: °¨ Heart fitness. °¨ Flexibility. °¨ Shoulder Strength. °· Falling with the proper technique. °PROGNOSIS  °In adults, healing time is about 7 weeks. For children, healing time is about 5 weeks. Surgery may be needed. °RELATED COMPLICATIONS °· The bones do not heal together (nonunion). °· The bones do not align properly when they heal (malunion). °· Long-term problems with pain, stiffness,  swelling, or loss of motion. °· The injured arm heals shorter than the other. °· Nerves are injured in the arm. °· Arthritis in the shoulder. °· Normal bone growth is interrupted in children. °· Blood supply to the shoulder joint is diminished. °TREATMENT °If the bones are aligned, then initial treatment will be with ice and medicine to help with pain. The shoulder will be held in place with a sling (immobilization). The shoulder will be allowed to heal for up to 6 weeks. Injuries that may need surgery include: °· Severe fractures. °· Fractures that are not in appropriate alignment (displaced). °· Non-displaced fractures (not common). °Surgery helps the bones align correctly. The bones may be held in place with: °· Sutures. °· Wires. °· Rods. °· Plates. °· Screws. °· Pins. °If you have had surgery or not, you will likely be assisted by a physical therapist or athletic trainer to get the best results with your injured shoulder. This will likely include exercises to strengthen and stretch the injured and surrounding areas. °MEDICATION °· If pain medicine is needed, nonsteroidal anti-inflammatory medicines (such as aspirin or ibuprofen) or other minor pain relievers (such as acetaminophen) are often advised. °· Do not take pain medicine for 7 days before surgery. °· Stronger pain relievers may be prescribed. Use only as directed and take only as much as you need. °COLD THERAPY °Cold treatment (icing) relieves pain and reduces inflammation. Cold treatment should be applied for 10 to 15 minutes every 2 to 3 hours, and immediately after activity that aggravates your symptoms. Use ice packs or an ice massage. °SEEK IMMEDIATE   MEDICAL CARE IF: °· You have severe shoulder pain unrelieved by rest and taking pain medicine. °· You have pain, numbness, tingling, or weakness in the hand or wrist. °· You have shortness of breath, chest pain, severe weakness, or fainting. °· You have severe pain with motion of the fingers or  wrist. °· Blue, gray, or dark color appears in the fingernails on injured extremity. °Document Released: 06/04/2005 Document Revised: 08/27/2011 Document Reviewed: 09/16/2008 °[employer]® Patient Information ©2015 [employer]. This information is not intended to replace advice given to you by your health care provider. Make sure you discuss any questions you have with your health care provider. ° °

## 2014-12-29 ENCOUNTER — Encounter (HOSPITAL_COMMUNITY): Payer: Self-pay | Admitting: *Deleted

## 2014-12-29 NOTE — Anesthesia Preprocedure Evaluation (Addendum)
Anesthesia Evaluation    Airway Mallampati: I   Neck ROM: Full    Dental  (+) Teeth Intact, Poor Dentition, Dental Advisory Given   Pulmonary Current Smoker,  breath sounds clear to auscultation  Pulmonary exam normal       Cardiovascular hypertension, Normal cardiovascular examRhythm:Regular Rate:Normal     Neuro/Psych    GI/Hepatic   Endo/Other  diabetes  Renal/GU      Musculoskeletal   Abdominal   Peds  Hematology   Anesthesia Other Findings   Reproductive/Obstetrics                          Anesthesia Physical Anesthesia Plan  ASA: III  Anesthesia Plan: General   Post-op Pain Management:    Induction: Intravenous  Airway Management Planned: Oral ETT  Additional Equipment:   Intra-op Plan:   Post-operative Plan: Extubation in OR  Informed Consent: I have reviewed the patients History and Physical, chart, labs and discussed the procedure including the risks, benefits and alternatives for the proposed anesthesia with the patient or authorized representative who has indicated his/her understanding and acceptance.   Dental advisory given  Plan Discussed with: CRNA, Surgeon and Anesthesiologist  Anesthesia Plan Comments:         Anesthesia Quick Evaluation

## 2014-12-29 NOTE — Progress Notes (Signed)
Pt gave consent for daughter, Arbie Cookey, to complete SDW-pre-op call. According to Arbie Cookey, pt was scheduled to arrive between 8:00 AM and 9:00 AM for a 1:30 PM procedure; Arbie Cookey advised to follow MD instructions. Arbie Cookey also stated that the  PA at Dr. Veverly Fells office instructed pt to hold Aspirin on DOS. Pt history reviewed with Dr. Linna Caprice, anesthesia; Dr. Linna Caprice ordered (verbal) a repeat EKG on DOS. Pt denies SOB and chest pain but stated that he is under the care of Dr. Johnsie Cancel, cardiology. Pt and daughter made aware to stop taking NSAID's, otc vitamins and herbal medications. Pt and daughter verbalized understanding of all pre-op instructions.

## 2014-12-30 ENCOUNTER — Inpatient Hospital Stay (HOSPITAL_COMMUNITY): Payer: Medicare Other

## 2014-12-30 ENCOUNTER — Inpatient Hospital Stay (HOSPITAL_COMMUNITY): Payer: Medicare Other | Admitting: Anesthesiology

## 2014-12-30 ENCOUNTER — Encounter (HOSPITAL_COMMUNITY)
Admission: RE | Disposition: A | Discharge status: Skilled Nursing Facility | Payer: Self-pay | Source: Ambulatory Visit | Attending: Orthopedic Surgery

## 2014-12-30 ENCOUNTER — Inpatient Hospital Stay (HOSPITAL_COMMUNITY)
Admission: RE | Admit: 2014-12-30 | Discharge: 2015-01-02 | DRG: 494 | Disposition: A | Discharge status: Skilled Nursing Facility | Payer: Medicare Other | Source: Ambulatory Visit | Attending: Orthopedic Surgery | Admitting: Orthopedic Surgery

## 2014-12-30 ENCOUNTER — Other Ambulatory Visit (HOSPITAL_COMMUNITY): Payer: Self-pay | Admitting: Physician Assistant

## 2014-12-30 DIAGNOSIS — N189 Chronic kidney disease, unspecified: Secondary | ICD-10-CM | POA: Diagnosis present

## 2014-12-30 DIAGNOSIS — E1122 Type 2 diabetes mellitus with diabetic chronic kidney disease: Secondary | ICD-10-CM | POA: Diagnosis present

## 2014-12-30 DIAGNOSIS — F17290 Nicotine dependence, other tobacco product, uncomplicated: Secondary | ICD-10-CM | POA: Diagnosis present

## 2014-12-30 DIAGNOSIS — I2510 Atherosclerotic heart disease of native coronary artery without angina pectoris: Secondary | ICD-10-CM | POA: Diagnosis present

## 2014-12-30 DIAGNOSIS — S42251A Displaced fracture of greater tuberosity of right humerus, initial encounter for closed fracture: Principal | ICD-10-CM | POA: Diagnosis present

## 2014-12-30 DIAGNOSIS — Z8673 Personal history of transient ischemic attack (TIA), and cerebral infarction without residual deficits: Secondary | ICD-10-CM

## 2014-12-30 DIAGNOSIS — S4290XA Fracture of unspecified shoulder girdle, part unspecified, initial encounter for closed fracture: Secondary | ICD-10-CM | POA: Diagnosis present

## 2014-12-30 DIAGNOSIS — Z951 Presence of aortocoronary bypass graft: Secondary | ICD-10-CM | POA: Diagnosis not present

## 2014-12-30 DIAGNOSIS — S42201A Unspecified fracture of upper end of right humerus, initial encounter for closed fracture: Secondary | ICD-10-CM

## 2014-12-30 DIAGNOSIS — I129 Hypertensive chronic kidney disease with stage 1 through stage 4 chronic kidney disease, or unspecified chronic kidney disease: Secondary | ICD-10-CM | POA: Diagnosis present

## 2014-12-30 DIAGNOSIS — M25511 Pain in right shoulder: Secondary | ICD-10-CM | POA: Diagnosis present

## 2014-12-30 DIAGNOSIS — E785 Hyperlipidemia, unspecified: Secondary | ICD-10-CM | POA: Diagnosis present

## 2014-12-30 DIAGNOSIS — I10 Essential (primary) hypertension: Secondary | ICD-10-CM | POA: Diagnosis present

## 2014-12-30 DIAGNOSIS — W109XXA Fall (on) (from) unspecified stairs and steps, initial encounter: Secondary | ICD-10-CM | POA: Diagnosis present

## 2014-12-30 DIAGNOSIS — S4291XA Fracture of right shoulder girdle, part unspecified, initial encounter for closed fracture: Secondary | ICD-10-CM

## 2014-12-30 DIAGNOSIS — Z7982 Long term (current) use of aspirin: Secondary | ICD-10-CM

## 2014-12-30 DIAGNOSIS — S42209A Unspecified fracture of upper end of unspecified humerus, initial encounter for closed fracture: Secondary | ICD-10-CM | POA: Insufficient documentation

## 2014-12-30 HISTORY — DX: Unspecified fracture of shaft of humerus, unspecified arm, initial encounter for closed fracture: S42.309A

## 2014-12-30 HISTORY — PX: REVERSE SHOULDER ARTHROPLASTY: SHX5054

## 2014-12-30 HISTORY — DX: Malignant (primary) neoplasm, unspecified: C80.1

## 2014-12-30 LAB — GLUCOSE, CAPILLARY
Glucose-Capillary: 163 mg/dL — ABNORMAL HIGH (ref 65–99)
Glucose-Capillary: 148 mg/dL — ABNORMAL HIGH (ref 65–99)

## 2014-12-30 LAB — CBC WITH DIFFERENTIAL/PLATELET
BASOS ABS: 0 10*3/uL (ref 0.0–0.1)
Basophils Relative: 0 % (ref 0–1)
EOS ABS: 0 10*3/uL (ref 0.0–0.7)
Eosinophils Relative: 0 % (ref 0–5)
HCT: 31.8 % — ABNORMAL LOW (ref 39.0–52.0)
Hemoglobin: 10.7 g/dL — ABNORMAL LOW (ref 13.0–17.0)
LYMPHS ABS: 1.1 10*3/uL (ref 0.7–4.0)
Lymphocytes Relative: 15 % (ref 12–46)
MCH: 29.6 pg (ref 26.0–34.0)
MCHC: 33.6 g/dL (ref 30.0–36.0)
MCV: 88.1 fL (ref 78.0–100.0)
Monocytes Absolute: 0.6 10*3/uL (ref 0.1–1.0)
Monocytes Relative: 8 % (ref 3–12)
NEUTROS ABS: 5.9 10*3/uL (ref 1.7–7.7)
Neutrophils Relative %: 77 % (ref 43–77)
Platelets: 187 10*3/uL (ref 150–400)
RBC: 3.61 MIL/uL — ABNORMAL LOW (ref 4.22–5.81)
RDW: 14 % (ref 11.5–15.5)
WBC: 7.7 10*3/uL (ref 4.0–10.5)

## 2014-12-30 LAB — BASIC METABOLIC PANEL
Anion gap: 7 (ref 5–15)
BUN: 37 mg/dL — ABNORMAL HIGH (ref 6–20)
CHLORIDE: 111 mmol/L (ref 101–111)
CO2: 22 mmol/L (ref 22–32)
Calcium: 9.5 mg/dL (ref 8.9–10.3)
Creatinine, Ser: 1.3 mg/dL — ABNORMAL HIGH (ref 0.61–1.24)
GFR calc non Af Amer: 52 mL/min — ABNORMAL LOW (ref >60)
GFR, EST AFRICAN AMERICAN: 60 mL/min — AB (ref >60)
Glucose, Bld: 171 mg/dL — ABNORMAL HIGH (ref 65–99)
POTASSIUM: 4 mmol/L (ref 3.5–5.1)
Sodium: 140 mmol/L (ref 135–145)

## 2014-12-30 LAB — SURGICAL PCR SCREEN
MRSA, PCR: NEGATIVE
STAPHYLOCOCCUS AUREUS: NEGATIVE

## 2014-12-30 LAB — PROTIME-INR
INR: 1.01 (ref 0.00–1.49)
INR: 1.18 (ref 0.00–1.49)
PROTHROMBIN TIME: 13.5 s (ref 11.6–15.2)
PROTHROMBIN TIME: 15.1 s (ref 11.6–15.2)

## 2014-12-30 SURGERY — ARTHROPLASTY, SHOULDER, TOTAL, REVERSE
Anesthesia: General | Site: Shoulder | Laterality: Right

## 2014-12-30 MED ORDER — CEFAZOLIN SODIUM-DEXTROSE 2-3 GM-% IV SOLR
2.0000 g | Freq: Four times a day (QID) | INTRAVENOUS | Status: AC
  Administered 2014-12-30 – 2014-12-31 (×3): 2 g via INTRAVENOUS
  Filled 2014-12-30 (×4): qty 50

## 2014-12-30 MED ORDER — SODIUM CHLORIDE 0.9 % IR SOLN
Status: DC | PRN
  Administered 2014-12-30: 1000 mL

## 2014-12-30 MED ORDER — ONDANSETRON HCL 4 MG/2ML IJ SOLN
4.0000 mg | Freq: Four times a day (QID) | INTRAMUSCULAR | Status: DC | PRN
Start: 2014-12-30 — End: 2015-01-02

## 2014-12-30 MED ORDER — ENOXAPARIN SODIUM 40 MG/0.4ML ~~LOC~~ SOLN
40.0000 mg | SUBCUTANEOUS | Status: DC
Start: 2014-12-31 — End: 2015-01-02
  Administered 2014-12-31 – 2015-01-02 (×3): 40 mg via SUBCUTANEOUS
  Filled 2014-12-30 (×3): qty 0.4

## 2014-12-30 MED ORDER — FENTANYL CITRATE (PF) 100 MCG/2ML IJ SOLN
INTRAMUSCULAR | Status: DC | PRN
  Administered 2014-12-30: 100 ug via INTRAVENOUS

## 2014-12-30 MED ORDER — BISACODYL 10 MG RE SUPP: 10.0000 mg | Freq: Every day | RECTAL | Status: DC | PRN

## 2014-12-30 MED ORDER — OXYCODONE HCL 5 MG PO TABS: 5.0000 mg | ORAL_TABLET | Freq: Once | ORAL | Status: DC | PRN

## 2014-12-30 MED ORDER — CHLORHEXIDINE GLUCONATE 4 % EX LIQD
60.0000 mL | Freq: Once | CUTANEOUS | Status: DC
  Filled 2014-12-30: qty 60

## 2014-12-30 MED ORDER — ENOXAPARIN SODIUM 40 MG/0.4ML ~~LOC~~ SOLN: 40.0000 mg | SUBCUTANEOUS | Status: DC

## 2014-12-30 MED ORDER — POLYETHYLENE GLYCOL 3350 17 G PO PACK
17.0000 g | PACK | Freq: Every day | ORAL | Status: DC | PRN
  Administered 2015-01-02: 17 g via ORAL
  Filled 2014-12-30: qty 1

## 2014-12-30 MED ORDER — FENOFIBRIC ACID 135 MG PO CPDR: 1.0000 | DELAYED_RELEASE_CAPSULE | Freq: Every day | ORAL | Status: DC

## 2014-12-30 MED ORDER — MORPHINE SULFATE 2 MG/ML IJ SOLN: 2.0000 mg | INTRAMUSCULAR | Status: DC | PRN

## 2014-12-30 MED ORDER — ACETAMINOPHEN 325 MG PO TABS: 650.0000 mg | ORAL_TABLET | Freq: Four times a day (QID) | ORAL | Status: DC | PRN

## 2014-12-30 MED ORDER — ARTIFICIAL TEARS OP OINT
TOPICAL_OINTMENT | OPHTHALMIC | Status: DC | PRN
  Administered 2014-12-30: 1 via OPHTHALMIC

## 2014-12-30 MED ORDER — BUPIVACAINE-EPINEPHRINE 0.25% -1:200000 IJ SOLN
INTRAMUSCULAR | Status: DC | PRN
  Administered 2014-12-30: 15 mL

## 2014-12-30 MED ORDER — MENTHOL 3 MG MT LOZG: 1.0000 | LOZENGE | OROMUCOSAL | Status: DC | PRN

## 2014-12-30 MED ORDER — PROPOFOL 10 MG/ML IV BOLUS
INTRAVENOUS | Status: DC | PRN
  Administered 2014-12-30: 130 mg via INTRAVENOUS

## 2014-12-30 MED ORDER — LACTATED RINGERS IV SOLN
INTRAVENOUS | Status: DC
  Administered 2014-12-30 (×2): via INTRAVENOUS

## 2014-12-30 MED ORDER — CEFAZOLIN SODIUM-DEXTROSE 2-3 GM-% IV SOLR
2.0000 g | INTRAVENOUS | Status: AC
  Administered 2014-12-30: 2 g via INTRAVENOUS

## 2014-12-30 MED ORDER — FENTANYL CITRATE (PF) 250 MCG/5ML IJ SOLN
INTRAMUSCULAR | Status: AC
  Filled 2014-12-30: qty 5

## 2014-12-30 MED ORDER — ONDANSETRON HCL 4 MG/2ML IJ SOLN: 4.0000 mg | Freq: Four times a day (QID) | INTRAMUSCULAR | Status: DC | PRN

## 2014-12-30 MED ORDER — ALLOPURINOL 100 MG PO TABS
100.0000 mg | ORAL_TABLET | Freq: Every day | ORAL | Status: DC
  Administered 2014-12-31 – 2015-01-02 (×3): 100 mg via ORAL
  Filled 2014-12-30 (×3): qty 1

## 2014-12-30 MED ORDER — SUCCINYLCHOLINE CHLORIDE 20 MG/ML IJ SOLN
INTRAMUSCULAR | Status: DC | PRN
  Administered 2014-12-30: 120 mg via INTRAVENOUS

## 2014-12-30 MED ORDER — METOPROLOL TARTRATE 50 MG PO TABS
50.0000 mg | ORAL_TABLET | Freq: Two times a day (BID) | ORAL | Status: DC
  Administered 2014-12-30 – 2015-01-02 (×6): 50 mg via ORAL
  Filled 2014-12-30 (×6): qty 1

## 2014-12-30 MED ORDER — HYDROCODONE-ACETAMINOPHEN 5-325 MG PO TABS: 1.0000 | ORAL_TABLET | Freq: Four times a day (QID) | ORAL | Status: DC | PRN

## 2014-12-30 MED ORDER — SODIUM CHLORIDE 0.9 % IV SOLN: INTRAVENOUS | Status: DC

## 2014-12-30 MED ORDER — PRAVASTATIN SODIUM 20 MG PO TABS
20.0000 mg | ORAL_TABLET | Freq: Every day | ORAL | Status: DC
  Administered 2014-12-31 – 2015-01-01 (×2): 20 mg via ORAL
  Filled 2014-12-30 (×3): qty 1

## 2014-12-30 MED ORDER — MORPHINE SULFATE 2 MG/ML IJ SOLN
2.0000 mg | INTRAMUSCULAR | Status: DC | PRN
  Administered 2014-12-30 – 2014-12-31 (×5): 2 mg via INTRAVENOUS
  Filled 2014-12-30 (×7): qty 1

## 2014-12-30 MED ORDER — CEFAZOLIN SODIUM-DEXTROSE 2-3 GM-% IV SOLR
2.0000 g | INTRAVENOUS | Status: AC
  Filled 2014-12-30: qty 50

## 2014-12-30 MED ORDER — METOCLOPRAMIDE HCL 5 MG PO TABS: 5.0000 mg | ORAL_TABLET | Freq: Three times a day (TID) | ORAL | Status: DC | PRN

## 2014-12-30 MED ORDER — SODIUM CHLORIDE 0.9 % IV SOLN
INTRAVENOUS | Status: DC
Start: 2014-12-30 — End: 2015-01-02
  Administered 2014-12-30: 20:00:00 via INTRAVENOUS
  Administered 2014-12-31: 1000 mL via INTRAVENOUS

## 2014-12-30 MED ORDER — ONDANSETRON HCL 4 MG/2ML IJ SOLN: INTRAMUSCULAR | Status: DC | PRN

## 2014-12-30 MED ORDER — MIDAZOLAM HCL 2 MG/2ML IJ SOLN
INTRAMUSCULAR | Status: AC
  Administered 2014-12-30: 1 mg
  Filled 2014-12-30: qty 2

## 2014-12-30 MED ORDER — ONDANSETRON HCL 4 MG/2ML IJ SOLN: 4.0000 mg | Freq: Once | INTRAMUSCULAR | Status: DC | PRN

## 2014-12-30 MED ORDER — ACETAMINOPHEN 500 MG PO TABS
1000.0000 mg | ORAL_TABLET | Freq: Four times a day (QID) | ORAL | Status: AC
  Administered 2014-12-30 – 2014-12-31 (×3): 1000 mg via ORAL
  Filled 2014-12-30 (×3): qty 2

## 2014-12-30 MED ORDER — PHENYLEPHRINE HCL 10 MG/ML IJ SOLN
10.0000 mg | INTRAVENOUS | Status: DC | PRN
  Administered 2014-12-30: 5 ug/min via INTRAVENOUS

## 2014-12-30 MED ORDER — FENTANYL CITRATE (PF) 100 MCG/2ML IJ SOLN
INTRAMUSCULAR | Status: AC
  Administered 2014-12-30: 50 ug
  Filled 2014-12-30: qty 2

## 2014-12-30 MED ORDER — ACETAMINOPHEN 650 MG RE SUPP: 650.0000 mg | Freq: Four times a day (QID) | RECTAL | Status: DC | PRN

## 2014-12-30 MED ORDER — FENTANYL CITRATE (PF) 100 MCG/2ML IJ SOLN: 25.0000 ug | INTRAMUSCULAR | Status: DC | PRN

## 2014-12-30 MED ORDER — MUPIROCIN 2 % EX OINT
1.0000 "application " | TOPICAL_OINTMENT | Freq: Once | CUTANEOUS | Status: AC
  Administered 2014-12-30: 1 via TOPICAL
  Filled 2014-12-30 (×2): qty 22

## 2014-12-30 MED ORDER — EPHEDRINE SULFATE 50 MG/ML IJ SOLN
INTRAMUSCULAR | Status: DC | PRN
  Administered 2014-12-30 (×2): 5 mg via INTRAVENOUS

## 2014-12-30 MED ORDER — OXYCODONE HCL 5 MG/5ML PO SOLN: 5.0000 mg | Freq: Once | ORAL | Status: DC | PRN

## 2014-12-30 MED ORDER — LIDOCAINE HCL (CARDIAC) 20 MG/ML IV SOLN
INTRAVENOUS | Status: DC | PRN
  Administered 2014-12-30: 2 mg via INTRAVENOUS

## 2014-12-30 MED ORDER — ONDANSETRON HCL 4 MG PO TABS: 4.0000 mg | ORAL_TABLET | Freq: Four times a day (QID) | ORAL | Status: DC | PRN

## 2014-12-30 MED ORDER — LIDOCAINE HCL (CARDIAC) 20 MG/ML IV SOLN
INTRAVENOUS | Status: AC
  Filled 2014-12-30: qty 5

## 2014-12-30 MED ORDER — ONDANSETRON HCL 4 MG/2ML IJ SOLN
INTRAMUSCULAR | Status: DC | PRN
  Administered 2014-12-30: 4 mg via INTRAVENOUS

## 2014-12-30 MED ORDER — METOCLOPRAMIDE HCL 5 MG/ML IJ SOLN: 5.0000 mg | Freq: Three times a day (TID) | INTRAMUSCULAR | Status: DC | PRN

## 2014-12-30 MED ORDER — PHENOL 1.4 % MT LIQD: 1.0000 | OROMUCOSAL | Status: DC | PRN

## 2014-12-30 MED ORDER — FINASTERIDE 5 MG PO TABS
5.0000 mg | ORAL_TABLET | Freq: Every day | ORAL | Status: DC
  Administered 2014-12-30 – 2015-01-02 (×4): 5 mg via ORAL
  Filled 2014-12-30 (×4): qty 1

## 2014-12-30 MED ORDER — BUPIVACAINE-EPINEPHRINE (PF) 0.25% -1:200000 IJ SOLN
INTRAMUSCULAR | Status: AC
  Filled 2014-12-30: qty 30

## 2014-12-30 MED ORDER — FENOFIBRATE 160 MG PO TABS
160.0000 mg | ORAL_TABLET | Freq: Every day | ORAL | Status: DC
  Administered 2014-12-31 – 2015-01-02 (×3): 160 mg via ORAL
  Filled 2014-12-30 (×3): qty 1

## 2014-12-30 MED ORDER — HYDROCODONE-ACETAMINOPHEN 5-325 MG PO TABS
2.0000 | ORAL_TABLET | ORAL | Status: DC | PRN
  Administered 2014-12-31 – 2015-01-02 (×11): 2 via ORAL
  Filled 2014-12-30 (×12): qty 2

## 2014-12-30 MED ORDER — ASPIRIN EC 81 MG PO TBEC
81.0000 mg | DELAYED_RELEASE_TABLET | Freq: Every day | ORAL | Status: DC
  Administered 2014-12-30 – 2015-01-02 (×4): 81 mg via ORAL
  Filled 2014-12-30 (×4): qty 1

## 2014-12-30 SURGICAL SUPPLY — 95 items
BIT DRILL 170X2.5X (BIT) ×1 IMPLANT
BIT DRILL 4 LONG FAST STEP (BIT) ×3 IMPLANT
BIT DRILL 4 SHORT FAST STEP (BIT) ×3 IMPLANT
BIT DRILL 5/64X5 DISP (BIT) ×3 IMPLANT
BIT DRILL QC 110 3.5 (BIT) ×1
BIT DRILL QC 110 3.5MM (BIT) ×1 IMPLANT
BIT DRL 170X2.5X (BIT) ×1
BLADE SAG 18X100X1.27 (BLADE) ×3 IMPLANT
CABLE ASSY CERCLAGE SST 1.8X55 (Orthopedic Implant) ×3 IMPLANT
CLOSURE WOUND 1/2 X4 (GAUZE/BANDAGES/DRESSINGS) ×1
COVER SURGICAL LIGHT HANDLE (MISCELLANEOUS) ×3 IMPLANT
DRAPE IMP U-DRAPE 54X76 (DRAPES) ×6 IMPLANT
DRAPE INCISE IOBAN 66X45 STRL (DRAPES) ×3 IMPLANT
DRAPE OEC MINIVIEW 54X84 (DRAPES) ×3 IMPLANT
DRAPE ORTHO SPLIT 77X108 STRL (DRAPES) ×4
DRAPE PROXIMA HALF (DRAPES) ×3 IMPLANT
DRAPE SURG ORHT 6 SPLT 77X108 (DRAPES) ×2 IMPLANT
DRAPE U-SHAPE 47X51 STRL (DRAPES) ×3 IMPLANT
DRAPE X-RAY CASS 24X20 (DRAPES) IMPLANT
DRILL 2.5 (BIT) ×2
DRILL BIT 2.8MM DB28 (MISCELLANEOUS) ×3 IMPLANT
DRILL BIT QC 110 3.5MM (BIT) ×2
DRSG ADAPTIC 3X8 NADH LF (GAUZE/BANDAGES/DRESSINGS) ×3 IMPLANT
DRSG PAD ABDOMINAL 8X10 ST (GAUZE/BANDAGES/DRESSINGS) ×3 IMPLANT
DURAPREP 26ML APPLICATOR (WOUND CARE) ×3 IMPLANT
ELECT BLADE 4.0 EZ CLEAN MEGAD (MISCELLANEOUS) ×3
ELECT NEEDLE TIP 2.8 STRL (NEEDLE) ×3 IMPLANT
ELECT REM PT RETURN 9FT ADLT (ELECTROSURGICAL) ×3
ELECTRODE BLDE 4.0 EZ CLN MEGD (MISCELLANEOUS) ×1 IMPLANT
ELECTRODE REM PT RTRN 9FT ADLT (ELECTROSURGICAL) ×1 IMPLANT
GAUZE SPONGE 4X4 12PLY STRL (GAUZE/BANDAGES/DRESSINGS) ×3 IMPLANT
GLOVE BIOGEL PI ORTHO PRO 7.5 (GLOVE) ×2
GLOVE BIOGEL PI ORTHO PRO SZ8 (GLOVE) ×2
GLOVE ORTHO TXT STRL SZ7.5 (GLOVE) ×3 IMPLANT
GLOVE PI ORTHO PRO STRL 7.5 (GLOVE) ×1 IMPLANT
GLOVE PI ORTHO PRO STRL SZ8 (GLOVE) ×1 IMPLANT
GLOVE SURG ORTHO 8.5 STRL (GLOVE) ×3 IMPLANT
GOWN STRL REUS W/ TWL LRG LVL3 (GOWN DISPOSABLE) ×1 IMPLANT
GOWN STRL REUS W/ TWL XL LVL3 (GOWN DISPOSABLE) ×2 IMPLANT
GOWN STRL REUS W/TWL LRG LVL3 (GOWN DISPOSABLE) ×2
GOWN STRL REUS W/TWL XL LVL3 (GOWN DISPOSABLE) ×4
HANDPIECE INTERPULSE COAX TIP (DISPOSABLE)
KIT BASIN OR (CUSTOM PROCEDURE TRAY) ×3 IMPLANT
KIT ROOM TURNOVER OR (KITS) ×3 IMPLANT
MANIFOLD NEPTUNE II (INSTRUMENTS) ×3 IMPLANT
NEEDLE 1/2 CIR MAYO (NEEDLE) ×3 IMPLANT
NEEDLE HYPO 25GX1X1/2 BEV (NEEDLE) ×3 IMPLANT
NS IRRIG 1000ML POUR BTL (IV SOLUTION) ×3 IMPLANT
PACK SHOULDER (CUSTOM PROCEDURE TRAY) ×3 IMPLANT
PAD ARMBOARD 7.5X6 YLW CONV (MISCELLANEOUS) ×6 IMPLANT
PEG STND 4.0X25.0MM (Orthopedic Implant) ×3 IMPLANT
PEG STND 4.0X32.5MM (Orthopedic Implant) ×3 IMPLANT
PEG STND 4.0X35MM (Orthopedic Implant) ×6 IMPLANT
PEG STND 4.0X37.5MM (Orthopedic Implant) ×3 IMPLANT
PEG STND 4.0X40MM (Orthopedic Implant) ×3 IMPLANT
PEG STND 4.0X42.5MM (Orthopedic Implant) ×3 IMPLANT
PEG STND 4.0X45.0MM (Orthopedic Implant) ×3 IMPLANT
PEGSTD 4.0X32.5MM (Orthopedic Implant) ×1 IMPLANT
PEGSTD 4.0X35MM (Orthopedic Implant) ×2 IMPLANT
PEGSTD 4.0X37.5MM (Orthopedic Implant) ×1 IMPLANT
PEGSTD 4.0X40MM (Orthopedic Implant) ×1 IMPLANT
PEGSTD 4.0X42.5MM (Orthopedic Implant) ×1 IMPLANT
PEGSTD 4.0X45.0MM (Orthopedic Implant) ×1 IMPLANT
PIN GUIDE SHOULDER 2.0MM (PIN) ×3 IMPLANT
PLATE SHOULDER S3 8HOLE RT (Plate) ×3 IMPLANT
SCREW CANC 4.0MMX32MM (Screw) ×3 IMPLANT
SCREW CORT 2.5X24X3.5XST SM (Screw) ×1 IMPLANT
SCREW CORTICAL 3.5X24MM (Screw) ×2 IMPLANT
SCREW CORTICAL 3.5X26 (Screw) ×3 IMPLANT
SCREW MULTIDIR 3.8X30 HUMRL (Screw) ×6 IMPLANT
SCREW MULTIDIR 3.8X32 HUMRL (Screw) ×6 IMPLANT
SET HNDPC FAN SPRY TIP SCT (DISPOSABLE) IMPLANT
SLING ARM IMMOBILIZER LRG (SOFTGOODS) ×3 IMPLANT
SLING ARM IMMOBILIZER MED (SOFTGOODS) ×3 IMPLANT
SLING ARM LRG ADULT FOAM STRAP (SOFTGOODS) ×3 IMPLANT
SLING ARM MED ADULT FOAM STRAP (SOFTGOODS) IMPLANT
SPONGE LAP 18X18 X RAY DECT (DISPOSABLE) IMPLANT
SPONGE LAP 4X18 X RAY DECT (DISPOSABLE) ×3 IMPLANT
STAPLER VISISTAT 35W (STAPLE) ×3 IMPLANT
STRIP CLOSURE SKIN 1/2X4 (GAUZE/BANDAGES/DRESSINGS) ×2 IMPLANT
SUCTION FRAZIER TIP 10 FR DISP (SUCTIONS) ×3 IMPLANT
SUT FIBERWIRE #2 38 T-5 BLUE (SUTURE) ×12
SUT MNCRL AB 4-0 PS2 18 (SUTURE) ×3 IMPLANT
SUT VIC AB 2-0 CT1 27 (SUTURE) ×4
SUT VIC AB 2-0 CT1 TAPERPNT 27 (SUTURE) ×2 IMPLANT
SUT VICRYL 0 CT 1 36IN (SUTURE) ×6 IMPLANT
SUTURE FIBERWR #2 38 T-5 BLUE (SUTURE) ×4 IMPLANT
SYR CONTROL 10ML LL (SYRINGE) ×3 IMPLANT
TAPE CLOTH SURG 6X10 WHT LF (GAUZE/BANDAGES/DRESSINGS) ×3 IMPLANT
TOWEL OR 17X24 6PK STRL BLUE (TOWEL DISPOSABLE) ×3 IMPLANT
TOWEL OR 17X26 10 PK STRL BLUE (TOWEL DISPOSABLE) ×3 IMPLANT
TOWER CARTRIDGE SMART MIX (DISPOSABLE) ×3 IMPLANT
TRAY FOLEY CATH 16FRSI W/METER (SET/KITS/TRAYS/PACK) IMPLANT
WATER STERILE IRR 1000ML POUR (IV SOLUTION) IMPLANT
YANKAUER SUCT BULB TIP NO VENT (SUCTIONS) ×3 IMPLANT

## 2014-12-30 NOTE — Progress Notes (Signed)
Called report to Short stay

## 2014-12-30 NOTE — H&P (Signed)
Juan [name] is an 79 y.o. male.    Chief Complaint: right shoulder pain  HPI: 79 y/o male with ground level fall 2 days ago injuring right shoulder/upper extremity. Denies any other injuries. Daughter states he has passed out due to the pain twice in the past 12 hours. Denies previous problems with the right shoulder in the past. Denies any head injury or injury to other extremity. Plan to admit for surgical management of the right proximal humerus fracture and maintain pain control.  PCP:  Unice Cobble, MD  PMH: Past Medical History  Diagnosis Date  . Hyperlipidemia   . Duodenitis   . Gout   . Diverticular disease   . Elevated PSA   . Tubular adenoma polyp of rectum   . Pancreatitis     a. ? Simvastatin related  . GERD (gastroesophageal reflux disease)   . Hydronephrosis with ureteropelvic junction obstruction   . Hypertension   . TIA (transient ischemic attack)     a. 01/2012 - presentation as acute imbalance;  b. 01/2012 carotid U/S w/o signif stenosis;  c. 01/2012 Echo: EF 60-65%, Gr 1 DD.;  d.  Carotid US (06/20/2013): 1-39% bilateral ICA stenosis  . Hypercalcemia 02/02/12    a. 01/2012: 10.7.  Marland Kitchen Syncope     a. 06/2013  . Pneumonia     "I think only once" (06/19/2013)  . Type II diabetes mellitus     "diet and exercise controlled at present" (06/19/2013)  . Coronary artery disease     a. s/p CABG 2000; b. myoview 9/08: inf-septal scar with mild peri-infarct ischemia (low risk);  c.  Echo 8/13: mild focal basal septal hypertrophy, Gr 1 DD, mild LAE;  d. Lexiscan Myoview (06/20/2013): No reversible ischemia, inferior and septal infarct, inferior and septal hypokinesis, EF 60%;  e.  Echo (06/20/2011): EF 50-55%, normal wall motion, grade 1 diastolic dysfunction, mild LAE  . CKD (chronic kidney disease) 06/24/2013  . Humerus fracture     right  . Cancer     skin    PSH: Past Surgical History  Procedure Laterality Date  . Cochlear implant Right 1990's  . Appendectomy    .  Cholecystectomy    . Colonoscopy w/ polypectomy      no polyp 2012  . Parathyroidectomy  1978  . Polypectomy    . Cardiac catheterization  2000  . Coronary artery bypass graft  2000    CABG X5  . Tonsillectomy  1943  . Ankle surgery Right ~ 1943    "don't know what for"  . Hernia repair      Social History:  reports that he has been smoking Cigars.  He has never used smokeless tobacco. He reports that he drinks alcohol. He reports that he does not use illicit drugs.  Allergies:  Allergies  Allergen Reactions  . Simvastatin     REACTION:  Possible statin induced pancreatitis    Medications: Current Outpatient Prescriptions  Medication Sig Dispense Refill  . allopurinol (ZYLOPRIM) 100 MG tablet Take 1 tablet (100 mg total) by mouth daily. 90 tablet 1  . aspirin EC 81 MG tablet Take 81 mg by mouth daily.    . Choline Fenofibrate (FENOFIBRIC ACID) 135 MG CPDR TAKE ONE CAPSULE BY MOUTH EVERY DAY (Patient taking differently: TAKE 135 MG BY MOUTH DAILY) 30 capsule 5  . finasteride (PROSCAR) 5 MG tablet Take 5 mg by mouth daily.    Marland Kitchen HYDROcodone-acetaminophen (NORCO/VICODIN) 5-325 MG per tablet Take 2 tablets  by mouth every 4 (four) hours as needed. 10 tablet 0  . metoprolol (LOPRESSOR) 50 MG tablet Take 1 tablet (50 mg total) by mouth 2 (two) times daily. 60 tablet 11  . pravastatin (PRAVACHOL) 20 MG tablet Take 1 tablet (20 mg total) by mouth daily. (Patient taking differently: Take 20 mg by mouth. Take Mon Wed Fri) 30 tablet 11   Current Facility-Administered Medications  Medication Dose Route Frequency Provider Last Rate Last Dose  . 0.9 %  sodium chloride infusion   Intravenous Continuous Brad Leevon Upperman, PA-C      . ceFAZolin (ANCEF) IVPB 2 g/50 mL premix  2 g Intravenous On Call to [place], PA-C      . chlorhexidine (HIBICLENS) 4 % liquid 4 application  60 mL Topical Once [place], PA-C      . morphine 2 MG/ML injection 2 mg  2 mg Intravenous Q2H PRN Brad Oden [place], PA-C      .  ondansetron (ZOFRAN) injection 4 mg  4 mg Intravenous Q6H PRN Merla Riches, PA-C        Results for orders placed or performed during the hospital encounter of 12/28/14 (from the past 48 hour(s))  Basic metabolic panel     Status: Abnormal   Collection Time: 12/28/14  6:01 PM  Result Value Ref Range   Sodium 139 135 - 145 mmol/L   Potassium 3.7 3.5 - 5.1 mmol/L   Chloride 107 101 - 111 mmol/L   CO2 22 22 - 32 mmol/L   Glucose, Bld 233 (H) 65 - 99 mg/dL   BUN 37 (H) 6 - 20 mg/dL   Creatinine, Ser 1.35 (H) 0.61 - 1.24 mg/dL   Calcium 10.1 8.9 - 10.3 mg/dL   GFR calc non Af Amer 49 (L) >60 mL/min   GFR calc Af Amer 57 (L) >60 mL/min    Comment: (NOTE) The eGFR has been calculated using the CKD EPI equation. This calculation has not been validated in all clinical situations. eGFR's persistently <60 mL/min signify possible Chronic Kidney Disease.    Anion gap 10 5 - 15  CBC with Differential     Status: Abnormal   Collection Time: 12/28/14  6:01 PM  Result Value Ref Range   WBC 8.8 4.0 - 10.5 K/uL   RBC 4.48 4.22 - 5.81 MIL/uL   Hemoglobin 13.3 13.0 - 17.0 g/dL   HCT 39.9 39.0 - 52.0 %   MCV 89.1 78.0 - 100.0 fL   MCH 29.7 26.0 - 34.0 pg   MCHC 33.3 30.0 - 36.0 g/dL   RDW 13.6 11.5 - 15.5 %   Platelets 203 150 - 400 K/uL   Neutrophils Relative % 84 (H) 43 - 77 %   Lymphocytes Relative 7 (L) 12 - 46 %   Monocytes Relative 9 3 - 12 %   Eosinophils Relative 0 0 - 5 %   Basophils Relative 0 0 - 1 %   Neutro Abs 7.4 1.7 - 7.7 K/uL   Lymphs Abs 0.6 (L) 0.7 - 4.0 K/uL   Monocytes Absolute 0.8 0.1 - 1.0 K/uL   Eosinophils Absolute 0.0 0.0 - 0.7 K/uL   Basophils Absolute 0.0 0.0 - 0.1 K/uL   Smear Review MORPHOLOGY UNREMARKABLE    Dg Shoulder Right  12/28/2014   CLINICAL DATA:  Fall off of steps with injury to right shoulder. Initial encounter.  EXAM: RIGHT SHOULDER - 2+ VIEW  COMPARISON:  None.  FINDINGS: Severely comminuted fracture of the proximal humerus  noted with significant  displacement at the level of the surgical neck as well as a comminuted fracture extending into the region of the greater tuberosity of the humeral head. It is difficult to determine if there is an associated glenoid fracture. The [facility] joint shows normal alignment. Overlying soft tissue swelling present.  IMPRESSION: Severely comminuted fracture of the proximal humerus with significant displacement at the level of the surgical neck. Comminuted fracture planes extend through the region of the greater tuberosity. CT evaluation may be helpful for surgical planning.   Electronically Signed   By: Aletta Edouard M.D.   On: 12/28/2014 16:20   Ct Head Wo Contrast  12/28/2014   CLINICAL DATA:  Fall on steps.  No loss of consciousness.  EXAM: CT HEAD WITHOUT CONTRAST  TECHNIQUE: Contiguous axial images were obtained from the base of the skull through the vertex without intravenous contrast.  COMPARISON:  CT scan of June 19, 2013.  FINDINGS: Bony calvarium appears intact. Cochlear implant is noted on the right with substantial artifact arising from implant over right parietal skull. No mass effect or midline shift is noted. Ventricular size is within normal limits. There is no evidence of mass lesion, hemorrhage or acute infarction.  IMPRESSION: Exam is somewhat limited due to substantial artifact arising from right-sided cochlear implant. No definite intracranial abnormality seen.   Electronically Signed   By: Marijo Conception, M.D.   On: 12/28/2014 19:23   Ct Shoulder Right Wo Contrast  12/28/2014   CLINICAL DATA:  79 year old male status post fall and right shoulder pains all  EXAM: CT OF THE RIGHT SHOULDER WITHOUT CONTRAST  TECHNIQUE: Multidetector CT imaging was performed according to the standard protocol. Multiplanar CT image reconstructions were also generated.  COMPARISON:  Radiograph dated 12/28/2014  FINDINGS: There is comminuted fracture of the right humeral head. There is multi fragmentary fracture of the  neck of the right humerus extending into the proximal humeral diaphysis. There is displacement of the humerus at the surgical neck. There is lateral tilting of the distal end of the neck of the humerus at the fracture site. There is diffuse soft tissue swelling and hematoma in the right shoulder.  IMPRESSION: Comminuted and displaced fractures of the head and neck of right humerus.   Electronically Signed   By: Anner Crete M.D.   On: 12/28/2014 20:05   Dg Chest Portable 1 View  12/28/2014   CLINICAL DATA:  79 year old male post fall and right shoulder fracture. Subsequent encounter.  EXAM: PORTABLE CHEST - 1 VIEW  COMPARISON:  12/28/2014 shoulder films.  06/19/2013 chest x-ray.  FINDINGS: Post CABG.  Calcified tortuous with aorta with tortuosity having increased from the prior exam.  Right base scarring/subsegmental atelectasis.  No gross pneumothorax or segmental consolidation.  No pulmonary edema.  No plain film evidence of pulmonary malignancy.  No obvious right-sided rib fracture.  Right shoulder fracture better demonstrated on earlier films.  IMPRESSION: Tortuous aorta.  Post CABG.  Right base subsegmental atelectasis/ scarring.   Electronically Signed   By: Genia Del M.D.   On: 12/28/2014 18:19    ROS: ROS Hard of hearing with cochlear implant  Physical Exam: Alert and oriented 79 y/o male in no acute distress Cervical spine with full rom  Cranial nerves 2-12 grossly intact Right upper extremity with mild edema and ecchymosis Very limited rom due to known fracture right proximal humerus nv intact distally Full rom of left upper extremity with no deformity or tenderness Physical Exam  Assessment/Plan Assessment: right proximal humerus fracture with displacement  Plan: Admit for surgical management of right proximal humerus fracture. Pt and family in agreement Pain control Keep NPO until surgery

## 2014-12-30 NOTE — Anesthesia Procedure Notes (Addendum)
Procedure Name: Intubation Date/Time: 12/30/2014 2:34 PM Performed by: Willeen Cass P Pre-anesthesia Checklist: Patient identified, Timeout performed, Emergency Drugs available, Suction available and Patient being monitored Patient Re-evaluated:Patient Re-evaluated prior to inductionOxygen Delivery Method: Circle system utilized Preoxygenation: Pre-oxygenation with 100% oxygen Intubation Type: IV induction Ventilation: Mask ventilation without difficulty Laryngoscope Size: Mac and 4 Grade View: Grade I Tube type: Oral Tube size: 7.5 mm Number of attempts: 1 Airway Equipment and Method: Stylet Placement Confirmation: ETT inserted through vocal cords under direct vision,  breath sounds checked- equal and bilateral and positive ETCO2 Secured at: 22 cm Tube secured with: Tape Dental Injury: Teeth and Oropharynx as per pre-operative assessment    Anesthesia Regional Block:  Interscalene brachial plexus block  Pre-Anesthetic Checklist: ,, timeout performed, Correct Patient, Correct Site, Correct Laterality, Correct Procedure, Correct Position, site marked, Risks and benefits discussed,  Surgical consent,  Pre-op evaluation,  At surgeon's request and post-op pain management  Laterality: Right  Prep: chloraprep       Needles:  Injection technique: Single-shot  Needle Type: Echogenic Stimulator Needle     Needle Length: 9cm 9 cm Needle Gauge: 21 and 21 G    Additional Needles:  Procedures: ultrasound guided (picture in chart) Interscalene brachial plexus block Narrative:  Start time: 12/30/2014 2:23 PM End time: 12/30/2014 3:26 PM Injection made incrementally with aspirations every 5 mL.  Performed by: Personally   Additional Notes: 25 cc 0.5% Bupivacaine 1:200 epi injected easily

## 2014-12-30 NOTE — Transfer of Care (Signed)
Immediate Anesthesia Transfer of Care Note  Patient: Juan [name]  Procedure(s) Performed: Procedure(s): REVERSE SHOULDER ARTHROPLASTY (Right)  Patient Location: PACU  Anesthesia Type:General  Level of Consciousness: awake and patient cooperative  Airway & Oxygen Therapy: Patient Spontanous Breathing and Patient connected to nasal cannula oxygen  Post-op Assessment: Report given to RN and Post -op Vital signs reviewed and stable  Post vital signs: Reviewed and stable  Last Vitals:  Filed Vitals:   12/30/14 1316  BP:   Pulse: 64  Temp:   Resp: 15    Complications: No apparent anesthesia complications

## 2014-12-30 NOTE — Discharge Instructions (Signed)
Strict NWB with the Right UE.  Ok to have the patient use the shoulder and arm for light ADLs  Daily Occupational and Physical Therapy recommended for AAROM and gentle ADLs and active wrist and elbow ROM.  Use sling unless doing exercises.  Keep a rolled blanket or pillow propped behind the upper arm and elbow to keep the arm across the waist.    High Fall Risk!  Likely SNF rehab after hospital and then [facility] PT,OT after discharge from SNF  Call for Follow Up appointment with Dr Veverly Fells in two weeks  [phone]

## 2014-12-30 NOTE — Brief Op Note (Signed)
12/30/2014  6:36 PM  PATIENT:  Levester Fresh  79 y.o. male  PRE-OPERATIVE DIAGNOSIS:  right proximal humerus fracture, displaced, comminuted  POST-OPERATIVE DIAGNOSIS:  right proximal humerus fracture, displaced, comminuted  PROCEDURE:  Right shoulder ORIF, Biomet S3 Plate and ZImmer cable system  SURGEON:  Surgeon(s) and Role:    * Netta Cedars, MD - Primary    * Netta Cedars, MD - Primary  PHYSICIAN ASSISTANT:   ASSISTANTS: Ventura Bruns, PA-C   ANESTHESIA:   regional and general  EBL:  Total I/O In: 1500 [I.V.:1500] Out: 500 [Urine:300; Blood:200]  BLOOD ADMINISTERED:none  DRAINS: none   LOCAL MEDICATIONS USED:  MARCAINE     SPECIMEN:  No Specimen  DISPOSITION OF SPECIMEN:  N/A  COUNTS:  YES  TOURNIQUET:  * No tourniquets in log *  DICTATION: .Other Dictation: Dictation Number [id number]  PLAN OF CARE: Admit to inpatient   PATIENT DISPOSITION:  PACU - hemodynamically stable.   Delay start of Pharmacological VTE agent (>24hrs) due to surgical blood loss or risk of bleeding: no

## 2014-12-30 NOTE — Anesthesia Postprocedure Evaluation (Signed)
Anesthesia Post Note  Patient: Juan [name]  Procedure(s) Performed: Procedure(s) (LRB): Right shoulder ORIF, Biomet S3 Plate and ZImmer cable system  (Right)  Anesthesia type: General  Patient location: PACU  Post pain: Pain level controlled  Post assessment: Post-op Vital signs reviewed  Last Vitals: BP 130/66 mmHg  Pulse 85  Temp(Src) 36.8 C (Oral)  Resp 18  Wt 178 lb 4 oz (80.854 kg)  SpO2 98%  Post vital signs: Reviewed  Level of consciousness: sedated  Complications: No apparent anesthesia complications

## 2014-12-31 ENCOUNTER — Encounter (HOSPITAL_COMMUNITY): Payer: Self-pay | Admitting: Orthopedic Surgery

## 2014-12-31 LAB — GLUCOSE, CAPILLARY
GLUCOSE-CAPILLARY: 157 mg/dL — AB (ref 65–99)
GLUCOSE-CAPILLARY: 161 mg/dL — AB (ref 65–99)
Glucose-Capillary: 153 mg/dL — ABNORMAL HIGH (ref 65–99)
Glucose-Capillary: 163 mg/dL — ABNORMAL HIGH (ref 65–99)

## 2014-12-31 LAB — BASIC METABOLIC PANEL
ANION GAP: 6 (ref 5–15)
BUN: 22 mg/dL — ABNORMAL HIGH (ref 6–20)
CALCIUM: 8.3 mg/dL — AB (ref 8.9–10.3)
CO2: 22 mmol/L (ref 22–32)
Chloride: 110 mmol/L (ref 101–111)
Creatinine, Ser: 1.24 mg/dL (ref 0.61–1.24)
GFR calc Af Amer: >60 mL/min (ref >60)
GFR, EST NON AFRICAN AMERICAN: 55 mL/min — AB (ref >60)
Glucose, Bld: 166 mg/dL — ABNORMAL HIGH (ref 65–99)
POTASSIUM: 3.5 mmol/L (ref 3.5–5.1)
SODIUM: 138 mmol/L (ref 135–145)

## 2014-12-31 LAB — HEMOGLOBIN AND HEMATOCRIT, BLOOD
HEMATOCRIT: 29 % — AB (ref 39.0–52.0)
HEMOGLOBIN: 9.8 g/dL — AB (ref 13.0–17.0)

## 2014-12-31 MED ORDER — MORPHINE SULFATE 2 MG/ML IJ SOLN
2.0000 mg | INTRAMUSCULAR | Status: DC | PRN
  Administered 2014-12-31: 4 mg via INTRAVENOUS
  Administered 2014-12-31: 2 mg via INTRAVENOUS
  Administered 2014-12-31: 4 mg via INTRAVENOUS
  Administered 2015-01-01 – 2015-01-02 (×3): 2 mg via INTRAVENOUS
  Filled 2014-12-31: qty 1
  Filled 2014-12-31 (×3): qty 2
  Filled 2014-12-31 (×3): qty 1

## 2014-12-31 NOTE — Progress Notes (Signed)
Orthopedics Progress Note  Subjective: Patient reports right arm pain this morning after block wore off.  Not really well controlled.  Objective:  Filed Vitals:   12/31/14 0619  BP: 136/70  Pulse: 78  Temp: 99.1 F (37.3 C)  Resp: 16    General: Awake and alert  Musculoskeletal: right shoulder dressing intact, NVI, min swelling distally Neurovascularly intact  Lab Results  Component Value Date   WBC 7.7 12/30/2014   HGB 9.8* 12/31/2014   HCT 29.0* 12/31/2014   MCV 88.1 12/30/2014   PLT 187 12/30/2014       Component Value Date/Time   NA 140 12/30/2014 1115   K 4.0 12/30/2014 1115   CL 111 12/30/2014 1115   CO2 22 12/30/2014 1115   GLUCOSE 171* 12/30/2014 1115   GLUCOSE 127* 05/16/2006 1101   BUN 37* 12/30/2014 1115   CREATININE 1.30* 12/30/2014 1115   CALCIUM 9.5 12/30/2014 1115   GFRNONAA 52* 12/30/2014 1115   GFRAA 60* 12/30/2014 1115    Lab Results  Component Value Date   INR 1.18 12/30/2014   INR 1.01 12/30/2014   INR 1.02 06/19/2013    Assessment/Plan: POD #1 s/p Procedure(s): Right shoulder ORIF, Biomet S3 Plate and ZImmer cable system  Will need SNF .  [place]. Veverly Fells, MD 12/31/2014 9:40 AM

## 2014-12-31 NOTE — Progress Notes (Signed)
Utilization review completed. Delisa Finck, RN, BSN. 

## 2014-12-31 NOTE — Evaluation (Signed)
Physical Therapy Evaluation Patient Details Name: Juan [name] MRN: [id number] DOB: August 28, 1935 Today's Date: 12/31/2014   History of Present Illness  s/p fall wtih resulting R proximal humerus fracture. Underwent ORIF R humerus.  Clinical Impression  Patient is s/p above surgery resulting in functional limitations due to the deficits listed below (see PT Problem List). Min assist to maintain balance ambulating to bathroom and back w/ use of SPC. Pt will benefit from further gait training w/ SPC.  Patient will benefit from skilled PT to increase their independence and safety with mobility to allow discharge to the venue listed below.     Follow Up Recommendations SNF    Equipment Recommendations  Other (comment) (TBD by next venue of care; likely cane)    Recommendations for Other Services       Precautions / Restrictions Precautions Precautions: Shoulder Type of Shoulder Precautions: conservative Shoulder Interventions: Shoulder sling/immobilizer;At all times;Off for dressing/bathing/exercises Precaution Comments: AROM shoudler with ADL focus . AROM within tolerance. Co slowly. AROM elbow wrist and hand OK Restrictions Weight Bearing Restrictions: Yes RUE Weight Bearing: Non weight bearing      Mobility  Bed Mobility               General bed mobility comments: in recliner  Transfers Overall transfer level: Needs assistance Equipment used: Straight cane Transfers: Sit to/from Stand Sit to Stand: Min guard         General transfer comment: Min guard for safety.  Cues for hand placement to push up from armrests and then using cane.  Cues again for use of armrests during stand>sit.  Ambulation/Gait Ambulation/Gait assistance: Min assist Ambulation Distance (Feet): 20 Feet Assistive device: Straight cane Gait Pattern/deviations: Shuffle;Decreased stride length;Staggering right;Narrow base of support;Staggering left;Trunk flexed   Gait velocity interpretation:  Below normal speed for age/gender General Gait Details: Pt has difficulty using cane properly despite demonstration and VCs.  Has tendency to hold cane too far anterior from body.  Min assist 2/2 mod instability during ambulation.  Stairs            Wheelchair Mobility    Modified Rankin (Stroke Patients Only)       Balance Overall balance assessment: Needs assistance;History of Falls Sitting-balance support: Single extremity supported;Feet supported Sitting balance-Leahy Scale: Good     Standing balance support: Single extremity supported;During functional activity Standing balance-Leahy Scale: Poor                               Pertinent Vitals/Pain Pain Assessment: 0-10 Pain Score: 10-Worst pain ever (w/ and w/o movement) Pain Location: R shoulder Pain Descriptors / Indicators: Aching;Grimacing Pain Intervention(s): Limited activity within patient's tolerance;Monitored during session;Repositioned    Home Living Family/patient expects to be discharged to:: Skilled nursing facility                      Prior Function Level of Independence: Independent               Hand Dominance   Dominant Hand: Right    Extremity/Trunk Assessment   Upper Extremity Assessment: Defer to OT evaluation           Lower Extremity Assessment: Generalized weakness      Cervical / Trunk Assessment: Normal  Communication   Communication: HOH;Other (comment) (cochlear implants)  Cognition Arousal/Alertness: Awake/alert Behavior During Therapy: WFL for tasks assessed/performed Overall Cognitive Status: Difficult to assess (appears to  demonstrate cognitive deficits. May be due to med)       Memory: Decreased short-term memory              General Comments      Exercises        Assessment/Plan    PT Assessment Patient needs continued PT services  PT Diagnosis Difficulty walking;Abnormality of gait;Generalized weakness;Acute pain    PT Problem List Decreased strength;Decreased range of motion;Decreased activity tolerance;Decreased balance;Decreased mobility;Decreased coordination;Decreased cognition;Decreased knowledge of use of DME;Decreased safety awareness;Decreased knowledge of precautions;Decreased skin integrity;Pain  PT Treatment Interventions DME instruction;Gait training;Stair training;Functional mobility training;Therapeutic activities;Therapeutic exercise;Balance training;Neuromuscular re-education;Cognitive remediation;Modalities;Patient/family education   PT Goals (Current goals can be found in the Care Plan section) Acute Rehab PT Goals Patient Stated Goal: to live independently PT Goal Formulation: With patient/family Time For Goal Achievement: 01/07/15 Potential to Achieve Goals: Good    Frequency Min 3X/week   Barriers to discharge        Co-evaluation               End of Session Equipment Utilized During Treatment: Gait belt Activity Tolerance: Patient limited by pain;Patient tolerated treatment well Patient left: in chair;with call bell/phone within reach;with family/visitor present Nurse Communication: Mobility status;Precautions;Weight bearing status         Time: 1216-1239 PT Time Calculation (min) (ACUTE ONLY): 23 min   Charges:   PT Evaluation $Initial PT Evaluation Tier I: 1 Procedure PT Treatments $Gait Training: 8-22 mins   PT G CodesJoslyn Hy PT, DPT [phone] Pager: [phone] 12/31/2014, 2:07 PM

## 2014-12-31 NOTE — Op Note (Signed)
NAMEFERDINAND, REVOIR NO.:  [id number]  MEDICAL RECORD NO.:  [id number]  LOCATION:  [id number]                        FACILITY:  [facility]  PHYSICIAN:  Doran Heater. Veverly Fells, M.D. DATE OF BIRTH:  May 25, 1936  DATE OF PROCEDURE:  12/30/2014 DATE OF DISCHARGE:                              OPERATIVE REPORT   PREOPERATIVE DIAGNOSIS:  Right displaced and comminuted proximal humerus fracture.  POSTOPERATIVE DIAGNOSIS:  Right displaced and comminuted proximal humerus fracture.  PROCEDURE PERFORMED:  Right shoulder open reduction and internal fixation using Biomet S3 plate with Zimmer cable x1.  ATTENDING SURGEON:  Doran Heater. Veverly Fells, M.D.  ASSISTANT:  Abbott Pao. Dixon, PA-C, who scrubbed the entire procedure and necessary for satisfactory completion of surgery.  ANESTHESIA:  General anesthesia was used plus interscalene block.  ESTIMATED BLOOD LOSS:  Minimal.  FLUID REPLACEMENT:  1200 mL crystalloids.  INSTRUMENT COUNTS:  Correct.  COMPLICATIONS:  There were no complications.  ANTIBIOTICS:  Perioperative antibiotics were given.  INDICATIONS:  The patient is a 79 year old male with a history of a fall injuring his right shoulder.  The patient presents with a displaced, comminuted proximal humerus fracture with extreme displacement to the initially emergency room.  The patient was seen by emergency room personnel and discharged home.  I was asked by one of my partners to take over care for this individual and I have reviewed the patient's x- rays and CT scan, which indicated very comminuted and highly displaced proximal humerus fracture, likely requiring arthroplasty.  CT scan did show that the humeral head was basically located within the glenoid; however, appeared that it could be head split component, certainly greater tuberosity was displaced and the shaft was displaced severely, medially.  Our initial thought was that this would be likely a reverse shoulder  arthroplasty as conventional hemiarthroplasty does require at least 6 months of physical therapy afterwards and require excellent compliance with postoperative regimens.  In talking with the patient and his family preoperatively, their thought was the reverse likely made more sense basically giving him a stable hinge point for the shoulder, understanding the compromising function, but hopefully repairing tuberosities to give better function with that reverse shoulder.  Risks and benefits of surgery were discussed with the patient and the family, and informed consent was obtained.  DESCRIPTION OF PROCEDURE:  After an adequate level of anesthesia was achieved, the patient was positioned in modified beach-chair position. Right shoulder was correctly identified, and sterilely prepped and draped in usual manner.  After time-out was called, we entered the shoulder using standard deltopectoral approach starting at the coracoid process extending down to the anterior humerus with the 10-blade scalpel.  Dissection was down through the subcutaneous tissues using Bovie, identified the cephalic vein, took it laterally with the deltoid, pectoralis was taken medially.  We immediately encountered fractured hematoma.  We next identified the fractured humeral shaft, which was displaced medially under the conjoined tendon.  We were able to freed that gently and moved that back into more lateral position where it belonged.  We noted there to be a large butterfly fragment posterolaterally that came off the humeral shaft, this was about 5-6 cm long and  severely compromised the integrity of the canal, which would have required a long-stem to prosthesis.  Of note, I was able to palpate the starting at the inferomedial aspect of the proximal head fragment that was a long spike medial calcar-type spike that proceeded distally and I was able to trace my finger up along that humeral head and then around the head  and really felt like that with the sweep around to the back that the head was basically in place and intact.  I also noted the lesser tuberosity be intact and the soft tissue sleeve covering the entire top of the humeral head was in place.  The greater tuberosity posteriorly appeared to be split and broken; however, were still the soft tissue.  Rotator cuff envelope was intact.  So, I was able to place #2 FiberWire posterior and lateral to the greater tuberosity and into the rotator cuff tendon and did three of those mattress sutures to gain good purchase on the greater tuberosity and it reduced and I felt it actually quick into place and reduced nicely.  Thus, we felt like this was a fixable humerus fracture, we just needed to reduce the big butterfly fragment to the shaft and anchored that with interfragmentary compression screws, which we did.  These were stainless steel screws off the Synthes set.  We then went ahead and placed a long Biomet S3 plate. We elevated the deltoid muscle off the lateral humerus, was able to place the plate and then placed a provisional clamp to hold it in proper position and then reduced the proximal fragment to the distal fragment. We then clamped that and pinned that, made sure that our height in our AP position of the plate was appropriate at top.  Once we verified that we had a good reduction and that the plate was centered on the humeral head and in right height.  Then, we went ahead and placed a screw in the sliding hole in the diaphysis.  Once that bicortical screw was in place, we placed a second screw to hold our rotation of that plate and then went ahead and began to feel the screws proximally, these were smooth pegs for the Biomet S3 set.  Once we placed our total of 6 pegs into the humeral head, we felt that we had good purchase and good control over the humeral head, everything moved together as a unit.  We then filled our screws distally.  We  had to basically span the area where we had already fixed the butterfly piece to the shaft that was in a different plane and actually the plane came up right through where the screw holes would be in the proximal portion of the plate.  We noted there to be a little bit of a bone defect in the region of the inferior smooth pegs and we were able to pack available bone graft that came out with one piece of bone that was basically of free fragment.  So, we used all that bone and tucked it up into the area where the pegs were, especially the inferior pegs.  We then went ahead and used a cable as there was a long medial shard and we were able to use a cable around the plate and around that the medial bone that was contiguous with the humeral head and I was able to suck that proximal piece and towards the plate and into proper alignment.  So, that definitely increased the overall stability of the ORIF  and we felt like that augmented that nicely.  We then took our rotator cuff sutures and brought those around the two most inferior sutures around to the subscapularis and went right across the plate and we were able to get excellent purchase.  We then also took the sutures that were more anterior in the rotator cuff and brought them down and then looped it underneath the most inferior of the pegs and under the plate and that gave [place] great purchase with that portion of the rotator cuff.  So, all of that suturing, really increased the stability of the proximal construct and we felt like made this a very stable ORIF.  We ranged the shoulder through a full arc of motion, no impingement was noted.  I checked to make sure the axillary nerve was free and clear, not in any of the ORIF there.  The biceps tendon likely was going to become nonfunctional as that was underneath our tuberosity sutures, but the entire construct moved together as a unit with no relative motion. So, we felt good about that.  We will  take the rehab really slowly.  We irrigated thoroughly with at least a liter of normal saline irrigation and then closed the deltopectoral interval with 0 Vicryl suture, interrupted followed by 2-0 Vicryl subcutaneous closure and staples for skin.  A sterile bandage was applied.  The patient tolerated the surgery very well.     Doran Heater. Veverly Fells, M.D.     SRN/MEDQ  D:  12/30/2014  T:  12/31/2014  Job:  [id number]

## 2015-01-01 LAB — GLUCOSE, CAPILLARY: Glucose-Capillary: 148 mg/dL — ABNORMAL HIGH (ref 65–99)

## 2015-01-01 MED ORDER — METHOCARBAMOL 500 MG PO TABS
500.0000 mg | ORAL_TABLET | Freq: Four times a day (QID) | ORAL | Status: DC | PRN
  Administered 2015-01-02: 500 mg via ORAL
  Filled 2015-01-01: qty 1

## 2015-01-01 NOTE — Clinical Social Work Note (Signed)
Clinical Social Work Assessment  Patient Details  Name: Juan [name] MRN: [id number] Date of Birth: 08-27-1935  Date of referral:  01/01/15               Reason for consult:  Facility Placement                Permission sought to share information with:  Facility Art therapist granted to share information::  Yes, Verbal Permission Granted  Name::        Agency::  SNFs for referral purposes  Relationship::     Contact Information:     Housing/Transportation Living arrangements for the past 2 months:  Single Family Home Source of Information:  Patient, Adult Children Patient Interpreter Needed:  None Criminal Activity/Legal Involvement Pertinent to Current Situation/Hospitalization:  No - Comment as needed Significant Relationships:  Adult Children Lives with:  Self Do you feel safe going back to the place where you live?  No Need for family participation in patient care:  No (Coment) (Pt requesting to keep his daughter updated)  Care giving concerns:  PT/MD recommending SNF at discharge; pt daughter also concerned about pt needing assistance with ADLs   Social Worker assessment / plan:  CSW visited pt room and spoke to pt and pt daughter at bedside. They were aware of recommendation and in agreement. Pt has not been admitted to facility before but has experience with [facility] from family members being there. CSW explained SNF referral process. Pt and pt daughter agreeable to referral being sent to all [facility] but would prefer [facility]. Pt daughter concerned about pt needs concerning hearing and would like a facility that is able to accommodate.   Employment status:    Insurance information:  Medicare PT Recommendations:  [place] / Referral to community resources:  [place]  Patient/Family's Response to care:  Pt and pt daughter in agreement to recommendation.  Patient/Family's Understanding of  and Emotional Response to Diagnosis, Current Treatment, and Prognosis:  Pt had limited understanding of condition but pt daughter has a good understanding. Both with appropriate emotional response.  Emotional Assessment Appearance:  Appears stated age, Well-Groomed Attitude/Demeanor/Rapport:  Other (Cooperative) Affect (typically observed):  Calm, Pleasant, Accepting Orientation:  Oriented to Self, Oriented to Place, Oriented to  Time, Oriented to Situation Alcohol / Substance use:    Psych involvement (Current and /or in the community):  No (Comment)  Discharge Needs  Concerns to be addressed:  Discharge Planning Concerns Readmission within the last 30 days:  No Current discharge risk:  Dependent with Mobility Barriers to Discharge:  Continued Medical Work up   [employer], Latanya Presser Weekend CSW [phone]

## 2015-01-01 NOTE — Clinical Social Work Placement (Signed)
   CLINICAL SOCIAL WORK PLACEMENT  NOTE  Date:  01/01/2015  Patient Details  Name: Juan [name] MRN: [id number] Date of Birth: 01/02/1936  Clinical Social Work is seeking post-discharge placement for this patient at the [place] level of care (*CSW will initial, date and re-position this form in  chart as items are completed):  Yes   Patient/family provided with [facility] Work Department's list of facilities offering this level of care within the geographic area requested by the patient (or if unable, by the patient's family).  Yes   Patient/family informed of their freedom to choose among providers that offer the needed level of care, that participate in Medicare, Medicaid or managed care program needed by the patient, have an available bed and are willing to accept the patient.  Yes   Patient/family informed of [facility]'s ownership interest in [facility] and [facility], as well as of the fact that they are under no obligation to receive care at these facilities.  PASRR submitted to EDS on       PASRR number received on       Existing PASRR number confirmed on       FL2 transmitted to all facilities in geographic area requested by pt/family on       FL2 transmitted to all facilities within larger geographic area on       Patient informed that his/her managed care company has contracts with or will negotiate with certain facilities, including the following:            Patient/family informed of bed offers received.  Patient chooses bed at       Physician recommends and patient chooses bed at      Patient to be transferred to   on  .  Patient to be transferred to facility by       Patient family notified on   of transfer.  Name of family member notified:        PHYSICIAN Please prepare priority discharge summary, including medications, Please prepare prescriptions, Please sign FL2     Additional Comment:    Adair Laundry Weekend CSW [phone]

## 2015-01-01 NOTE — Progress Notes (Signed)
Subjective: 2 Days Post-Op Procedure(s) (LRB): Right shoulder ORIF, Biomet S3 Plate and ZImmer cable system  (Right) Patient reports pain as moderate.  No n/v.  Tolerating regular diet.  No numbness, tingling or weakness in the R UE.   Objective: Vital signs in last 24 hours: Temp:  [97.6 F (36.4 C)-98.7 F (37.1 C)] 97.6 F (36.4 C) (07/16 0500) Pulse Rate:  [60-72] 60 (07/16 0500) Resp:  [16-20] 20 (07/16 0500) BP: (116-128)/(49-62) 125/62 mmHg (07/16 0500) SpO2:  [95 %-99 %] 95 % (07/16 0500)  Intake/Output from previous day: 07/15 0701 - 07/16 0700 In: 1755.7 [P.O.:555; I.V.:1100.7; IV Piggyback:100] Out: 175 [Urine:175] Intake/Output this shift: Total I/O In: 360 [P.O.:360] Out: -    Recent Labs  12/30/14 1115 12/31/14 0830  HGB 10.7* 9.8*    Recent Labs  12/30/14 1115 12/31/14 0830  WBC 7.7  --   RBC 3.61*  --   HCT 31.8* 29.0*  PLT 187  --     Recent Labs  12/30/14 1115 12/31/14 0830  NA 140 138  K 4.0 3.5  CL 111 110  CO2 22 22  BUN 37* 22*  CREATININE 1.30* 1.24  GLUCOSE 171* 166*  CALCIUM 9.5 8.3*    Recent Labs  12/30/14 0910 12/30/14 1115  INR 1.01 1.18    PE:  wn wd male in nad.  HOH.  R UE dressed and dry.  NVI at R Hand.  Assessment/Plan: 2 Days Post-Op Procedure(s) (LRB): Right shoulder ORIF, Biomet S3 Plate and ZImmer cable system  (Right) Up with therapy  [facility] rehab Monday.  Wylene Simmer 01/01/2015, 1:51 PM

## 2015-01-01 NOTE — Progress Notes (Signed)
Occupational Therapy Treatment Patient Details Name: Juan [name] MRN: [id number] DOB: 1936-01-05 Today's Date: 01/01/2015    History of present illness s/p fall wtih resulting R proximal humerus fracture. Underwent ORIF R humerus.   OT comments  This 79 yo male admitted and underwent above presents to acute OT with increased pain, decreased mobility, decreased balance, decreased use of RUE (dominant) all affecting his ability to care for himself at home by himself. He is making progress slowly, will continue to benefit from acute OT with follow up OT at SNF to get to a Mod I level.  Follow Up Recommendations  SNF;Supervision/Assistance - 24 hour    Equipment Recommendations   (TBD at next venue)       Precautions / Restrictions Precautions Precautions: Shoulder Type of Shoulder Precautions: conservative--gentle ADL focus AROM and AAROM to tolerance only, go slow; AROM of elbow, wrist, hand OK Shoulder Interventions: Shoulder sling/immobilizer;At all times;Off for dressing/bathing/exercises Restrictions Weight Bearing Restrictions: Yes RUE Weight Bearing: Non weight bearing       Mobility Bed Mobility               General bed mobility comments: Pt up in recliner upon my arrival  Transfers Overall transfer level: Needs assistance Equipment used: None Transfers: Sit to/from Stand Sit to Stand: Min assist              Balance Overall balance assessment: Needs assistance;History of Falls Sitting-balance support: Single extremity supported;Feet supported Sitting balance-Leahy Scale: Good     Standing balance support: No upper extremity supported;During functional activity Standing balance-Leahy Scale: Poor                     ADL Overall ADL's : Needs assistance/impaired                 Upper Body Dressing : Maximal assistance;Sitting (button up shirt; RUE in first)   Lower Body Dressing: Moderate assistance (with min A sit<>stand)                                  Cognition   Behavior During Therapy: WFL for tasks assessed/performed Overall Cognitive Status: Impaired/Different from baseline Area of Impairment: Safety/judgement          Safety/Judgement:  (Thinks that he can get up to the bathroom by himself safely but he cannot)            Exercises Other Exercises Other Exercises: 20 reps of AAROM lap slides and 10 reps of AAROM elbow flexion/extension; with encouragement to daughter to have him do some more lap slides today           Pertinent Vitals/ Pain       Pain Assessment: Faces Faces Pain Scale: Hurts even more Pain Location: right shoulder with UB/LB D Pain Descriptors / Indicators: Aching;Grimacing;Guarding;Sore Pain Intervention(s): Monitored during session;Repositioned         Frequency Min 3X/week     Progress Toward Goals  OT Goals(current goals can now be found in the care plan section)  Progress towards OT goals: Progressing toward goals     Plan Discharge plan remains appropriate       End of Session Equipment Utilized During Treatment:  (sling)   Activity Tolerance Patient tolerated treatment well   Patient Left in chair;with call bell/phone within reach;with family/visitor present (Asked daughter that if she left to make sure chair alarm was  under pt or that he was back in the bed (had one in room))           Time: 4932-4199 OT Time Calculation (min): 29 min  Charges: OT General Charges $OT Visit: 1 Procedure OT Treatments $Self Care/Home Management : 8-22 mins $Therapeutic Exercise: 8-22 mins  Almon Register [phone] 01/01/2015, 1:26 PM

## 2015-01-01 NOTE — Progress Notes (Addendum)
12/31/14 1000  OT Visit Information  Last OT Received On 12/31/14  History of Present Illness s/p fall wtih resulting R proximal humerus fracture. Underwent ORIF R humerus.  Precautions  Precautions Shoulder  Type of Shoulder Precautions conservative  Shoulder Interventions Shoulder sling/immobilizer;At all times;Off for dressing/bathing/exercises  Precaution Booklet Issued Yes (comment)  Precaution Comments AROM shoudler with ADL focus . AROM within tolerance. Co slowly. AROM elbow wrist and hand OK  Restrictions  Weight Bearing Restrictions Yes  RUE Weight Bearing NWB  Home Living  Family/patient expects to be discharged to: [place]  Prior Function  Level of [place];Other (comment) (cochlear implants)  Pain Assessment  Pain Assessment 0-10  Pain Score 0 (without movement. 10 with movement)  Pain Location R shoulder  Pain Descriptors / Indicators Aching;Discomfort;Sharp  Pain Intervention(s) Limited activity within patient's tolerance;Monitored during session;Repositioned;Ice applied  Cognition  Arousal/Alertness Awake/alert  Behavior During Therapy WFL for tasks assessed/performed  Overall Cognitive Status Difficult to assess (appears to demonstrate cognitive deficits. May be due to med)  Memory Decreased short-term memory  Difficult to assess due to Hard of hearing/deaf  Upper Extremity Assessment  Upper Extremity Assessment RUE deficits/detail  RUE Deficits / Details mod edema R elbow. completing AAROM R elbow. not tolerating much movement R shoulder for ADL tasks. Feel nerve block still active.  RUE Unable to fully assess due to pain  RUE Coordination decreased fine motor;decreased gross motor  Lower Extremity Assessment  Lower Extremity Assessment Defer to PT evaluation  Cervical / Trunk Assessment  Cervical / Trunk Assessment Normal  ADL  Overall ADL's  Needs assistance/impaired  Eating/Feeding  Set up  Grooming Moderate assistance  Upper Body Bathing Moderate assistance  Lower Body Bathing Moderate assistance;Sit to/from stand  Upper Body Dressing  Maximal assistance;Sitting  Lower Body Dressing Moderate assistance;Sit to/from Retail buyer Minimal assistance;Ambulation  Toileting- Clothing Manipulation and Hygiene Moderate assistance  Functional mobility during ADLs Minimal assistance (shuffling gait)  General ADL Comments Pt expressed fear of falling. Talked about falling and lying on the ground for 2/5 hours before he could get help. Discussed wife passing away 3 years ago. Pt tearful at times. discussed rehab process and shouelr precautions with pt's daugher. Reviewed handout. also discussed recommendation of " emergency button" system to use after D/C home. Daughter verbalized understanding.   Vision- History  Baseline Vision/History Wears glasses  Vision- Assessment  Additional Comments wears glasses  Bed Mobility  Overal bed mobility Needs Assistance  Bed Mobility Supine to Sit  Supine to sit Mod assist;HOB elevated  General bed mobility comments difficulty scootinghips forward to EOB without using RUE  Transfers  Overall transfer level Needs assistance  Equipment used 1 person hand held assist  Transfers Sit to/from Stand  Sit to Stand Min assist  General transfer comment Pt expressed fear of falling  Balance  Overall balance assessment History of Falls;Needs assistance  Sitting balance-Leahy Scale Good  Standing balance support Single extremity supported;During functional activity  Standing balance-Leahy Scale Poor  Exercises  Exercises Shoulder  Shoulder Instructions  Donning/doffing shirt without moving shoulder Maximal assistance  Method for sponge bathing under operated UE Moderate assistance  Donning/doffing sling/immobilizer Maximal assistance  Correct positioning of sling/immobilizer Maximal assistance  ROM for elbow, wrist and digits of  operated UE Minimal assistance  Sling wearing schedule (on at all times/off for ADL's) Moderate assistance  Proper positioning of operated UE when showering Moderate assistance  Positioning of UE  while sleeping Moderate assistance  Shoulder Exercises  Shoulder Flexion AAROM;Seated;10 reps;Right (lap slides)  Elbow Flexion AAROM;Right;10 reps;Seated  Elbow Extension AAROM;Right;10 reps;Seated  Wrist Flexion AAROM;Right;10 reps  Wrist Extension AAROM;Right;10 reps  Digit Composite Flexion AROM;Right;10 reps  Composite Extension AROM;Right;10 reps  OT - End of Session  Equipment Utilized During Treatment Gait belt  Activity Tolerance Patient tolerated treatment well  Patient left in chair;with call bell/phone within reach;with family/visitor present  Nurse Communication Mobility status;Precautions;Weight bearing status  OT Assessment  OT Therapy Diagnosis  Generalized weakness;Acute pain  OT Recommendation/Assessment Patient needs continued OT Services  OT Problem List Decreased strength;Decreased range of motion;Decreased activity tolerance;Impaired balance (sitting and/or standing);Decreased coordination;Decreased safety awareness;Decreased knowledge of use of DME or AE;Decreased knowledge of precautions;Impaired UE functional use;Pain;Increased edema  Barriers to Discharge Decreased caregiver support  OT Plan  OT Frequency (ACUTE ONLY) Min 3X/week  OT Treatment/Interventions (ACUTE ONLY) Self-care/ADL training;Therapeutic exercise;DME and/or AE instruction;Therapeutic activities;Patient/family education;Balance training  OT Recommendation  Follow Up Recommendations SNF;Supervision/Assistance - 24 hour  OT Equipment Other (comment) (TBA at SNF)  Individuals Consulted  Consulted and Agree with Results and Recommendations Patient;Family member/caregiver  Family Member Consulted daughter  Acute Rehab OT Goals  Patient Stated Goal to live independently  OT Goal Formulation With patient   Time For Goal Achievement 01/07/15  Potential to Achieve Goals Good  OT Time Calculation  OT Start Time (ACUTE ONLY) 1005  OT Stop Time (ACUTE ONLY) 1048  OT Time Calculation (min) 43 min  OT General Charges  $OT Visit 1 Procedure  OT Evaluation  $Initial OT Evaluation Tier I 1 Procedure  OT Treatments  $Self Care/Home Management  8-22 mins  $Therapeutic Activity 8-22 mins  Written Expression  Dominant Hand Right   Late entry for [facility], OTR/L [place], OTR/L [phone] 01/01/2015

## 2015-01-02 MED ORDER — HYDROCODONE-ACETAMINOPHEN 5-325 MG PO TABS
1.0000 | ORAL_TABLET | ORAL | Status: DC | PRN
Start: 2015-01-02 — End: 2015-01-14

## 2015-01-02 MED ORDER — ENOXAPARIN SODIUM 40 MG/0.4ML ~~LOC~~ SOLN: 40.0000 mg | SUBCUTANEOUS | Status: DC

## 2015-01-02 NOTE — Progress Notes (Signed)
Patient refused CBG check at this time.

## 2015-01-02 NOTE — Progress Notes (Signed)
Pt to be transferred to [facility] today via ambulance.  Pt/daughter agreeable to transfer.

## 2015-01-02 NOTE — Discharge Summary (Signed)
Physician Discharge Summary   Patient ID: Juan [name] MRN: [id number] DOB/AGE: 12/18/1935 78 y.o.  Admit date: 12/30/2014 Discharge date: tentative 01/02/2015  Primary Diagnosis: Right humerus fracture  Admission Diagnoses:  Past Medical History  Diagnosis Date  . Hyperlipidemia   . Duodenitis   . Gout   . Diverticular disease   . Elevated PSA   . Tubular adenoma polyp of rectum   . Pancreatitis     a. ? Simvastatin related  . GERD (gastroesophageal reflux disease)   . Hydronephrosis with ureteropelvic junction obstruction   . Hypertension   . TIA (transient ischemic attack)     a. 01/2012 - presentation as acute imbalance;  b. 01/2012 carotid U/S w/o signif stenosis;  c. 01/2012 Echo: EF 60-65%, Gr 1 DD.;  d.  Carotid US (06/20/2013): 1-39% bilateral ICA stenosis  . Hypercalcemia 02/02/12    a. 01/2012: 10.7.  . Syncope     a. 06/2013  . Pneumonia     "I think only once" (06/19/2013)  . Type II diabetes mellitus     "diet and exercise controlled at present" (06/19/2013)  . Coronary artery disease     a. s/p CABG 2000; b. myoview 9/08: inf-septal scar with mild peri-infarct ischemia (low risk);  c.  Echo 8/13: mild focal basal septal hypertrophy, Gr 1 DD, mild LAE;  d. Lexiscan Myoview (06/20/2013): No reversible ischemia, inferior and septal infarct, inferior and septal hypokinesis, EF 60%;  e.  Echo (06/20/2011): EF 50-55%, normal wall motion, grade 1 diastolic dysfunction, mild LAE  . CKD (chronic kidney disease) 06/24/2013  . Humerus fracture     right  . Cancer     skin   Discharge Diagnoses:   Active Problems:   Shoulder fracture  Estimated body mass index is 24.87 kg/(m^2) as calculated from the following:   Height as of 09/20/14: 5' 11" (1.803 m).   Weight as of this encounter: 80.854 kg (178 lb 4 oz).  Procedure:  Procedure(s) (LRB): Right shoulder ORIF, Biomet S3 Plate and ZImmer cable system  (Right)   Consults: None  HPI: 78 y/o male with ground level fall 2 days ago  injuring right shoulder/upper extremity. Denies any other injuries. Daughter states he has passed out due to the pain twice in the past 12 hours. Denies previous problems with the right shoulder in the past. Denies any head injury or injury to other extremity. Plan to admit for surgical management of the right proximal humerus fracture and maintain pain control.  Laboratory Data: Admission on 12/30/2014  Component Date Value Ref Range Status  . Prothrombin Time 12/30/2014 13.5  11.6 - 15.2 seconds Final  . INR 12/30/2014 1.01  0.00 - 1.49 Final  . MRSA, PCR 12/30/2014 NEGATIVE  NEGATIVE Final  . Staphylococcus aureus 12/30/2014 NEGATIVE  NEGATIVE Final   Comment:        The Xpert SA Assay (FDA approved for NASAL specimens in patients over 21 years of age), is one component of a comprehensive surveillance program.  Test performance has been validated by [facility] for patients greater than or equal to 1 year old. It is not intended to diagnose infection nor to guide or monitor treatment.   . WBC 12/30/2014 7.7  4.0 - 10.5 K/uL Final  . RBC 12/30/2014 3.61* 4.22 - 5.81 MIL/uL Final  . Hemoglobin 12/30/2014 10.7* 13.0 - 17.0 g/dL Final  . HCT 12/30/2014 31.8* 39.0 - 52.0 % Final  . MCV 12/30/2014 88.1  78.0 - 100.0   fL Final  . MCH 12/30/2014 29.6  26.0 - 34.0 pg Final  . MCHC 12/30/2014 33.6  30.0 - 36.0 g/dL Final  . RDW 12/30/2014 14.0  11.5 - 15.5 % Final  . Platelets 12/30/2014 187  150 - 400 K/uL Final  . Neutrophils Relative % 12/30/2014 77  43 - 77 % Final  . Neutro Abs 12/30/2014 5.9  1.7 - 7.7 K/uL Final  . Lymphocytes Relative 12/30/2014 15  12 - 46 % Final  . Lymphs Abs 12/30/2014 1.1  0.7 - 4.0 K/uL Final  . Monocytes Relative 12/30/2014 8  3 - 12 % Final  . Monocytes Absolute 12/30/2014 0.6  0.1 - 1.0 K/uL Final  . Eosinophils Relative 12/30/2014 0  0 - 5 % Final  . Eosinophils Absolute 12/30/2014 0.0  0.0 - 0.7 K/uL Final  . Basophils Relative 12/30/2014 0  0 - 1 %  Final  . Basophils Absolute 12/30/2014 0.0  0.0 - 0.1 K/uL Final  . Sodium 12/30/2014 140  135 - 145 mmol/L Final  . Potassium 12/30/2014 4.0  3.5 - 5.1 mmol/L Final  . Chloride 12/30/2014 111  101 - 111 mmol/L Final  . CO2 12/30/2014 22  22 - 32 mmol/L Final  . Glucose, Bld 12/30/2014 171* 65 - 99 mg/dL Final  . BUN 12/30/2014 37* 6 - 20 mg/dL Final  . Creatinine, Ser 12/30/2014 1.30* 0.61 - 1.24 mg/dL Final  . Calcium 12/30/2014 9.5  8.9 - 10.3 mg/dL Final  . GFR calc non Af Amer 12/30/2014 52* >60 mL/min Final  . GFR calc Af Amer 12/30/2014 60* >60 mL/min Final   Comment: (NOTE) The eGFR has been calculated using the CKD EPI equation. This calculation has not been validated in all clinical situations. eGFR's persistently <60 mL/min signify possible Chronic Kidney Disease.   . Anion gap 12/30/2014 7  5 - 15 Final  . Prothrombin Time 12/30/2014 15.1  11.6 - 15.2 seconds Final  . INR 12/30/2014 1.18  0.00 - 1.49 Final  . Glucose-Capillary 12/30/2014 163* 65 - 99 mg/dL Final  . Glucose-Capillary 12/30/2014 148* 65 - 99 mg/dL Final  . Comment 1 12/30/2014 Repeat Test   Final  . Comment 2 12/30/2014 Document in Chart   Final  . Hemoglobin 12/31/2014 9.8* 13.0 - 17.0 g/dL Final  . HCT 12/31/2014 29.0* 39.0 - 52.0 % Final  . Sodium 12/31/2014 138  135 - 145 mmol/L Final  . Potassium 12/31/2014 3.5  3.5 - 5.1 mmol/L Final  . Chloride 12/31/2014 110  101 - 111 mmol/L Final  . CO2 12/31/2014 22  22 - 32 mmol/L Final  . Glucose, Bld 12/31/2014 166* 65 - 99 mg/dL Final  . BUN 12/31/2014 22* 6 - 20 mg/dL Final  . Creatinine, Ser 12/31/2014 1.24  0.61 - 1.24 mg/dL Final  . Calcium 12/31/2014 8.3* 8.9 - 10.3 mg/dL Final  . GFR calc non Af Amer 12/31/2014 55* >60 mL/min Final  . GFR calc Af Amer 12/31/2014 >60  >60 mL/min Final   Comment: (NOTE) The eGFR has been calculated using the CKD EPI equation. This calculation has not been validated in all clinical situations. eGFR's persistently  <60 mL/min signify possible Chronic Kidney Disease.   . Anion gap 12/31/2014 6  5 - 15 Final  . Glucose-Capillary 12/30/2014 157* 65 - 99 mg/dL Final  . Glucose-Capillary 12/31/2014 161* 65 - 99 mg/dL Final  . Glucose-Capillary 12/31/2014 153* 65 - 99 mg/dL Final  . Glucose-Capillary 12/31/2014 163* 65 -  99 mg/dL Final  . Glucose-Capillary 01/01/2015 148* 65 - 99 mg/dL Final  Admission on 12/28/2014, Discharged on 12/28/2014  Component Date Value Ref Range Status  . Sodium 12/28/2014 139  135 - 145 mmol/L Final  . Potassium 12/28/2014 3.7  3.5 - 5.1 mmol/L Final  . Chloride 12/28/2014 107  101 - 111 mmol/L Final  . CO2 12/28/2014 22  22 - 32 mmol/L Final  . Glucose, Bld 12/28/2014 233* 65 - 99 mg/dL Final  . BUN 12/28/2014 37* 6 - 20 mg/dL Final  . Creatinine, Ser 12/28/2014 1.35* 0.61 - 1.24 mg/dL Final  . Calcium 12/28/2014 10.1  8.9 - 10.3 mg/dL Final  . GFR calc non Af Amer 12/28/2014 49* >60 mL/min Final  . GFR calc Af Amer 12/28/2014 57* >60 mL/min Final   Comment: (NOTE) The eGFR has been calculated using the CKD EPI equation. This calculation has not been validated in all clinical situations. eGFR's persistently <60 mL/min signify possible Chronic Kidney Disease.   . Anion gap 12/28/2014 10  5 - 15 Final  . WBC 12/28/2014 8.8  4.0 - 10.5 K/uL Final  . RBC 12/28/2014 4.48  4.22 - 5.81 MIL/uL Final  . Hemoglobin 12/28/2014 13.3  13.0 - 17.0 g/dL Final  . HCT 12/28/2014 39.9  39.0 - 52.0 % Final  . MCV 12/28/2014 89.1  78.0 - 100.0 fL Final  . MCH 12/28/2014 29.7  26.0 - 34.0 pg Final  . MCHC 12/28/2014 33.3  30.0 - 36.0 g/dL Final  . RDW 12/28/2014 13.6  11.5 - 15.5 % Final  . Platelets 12/28/2014 203  150 - 400 K/uL Final  . Neutrophils Relative % 12/28/2014 84* 43 - 77 % Final  . Lymphocytes Relative 12/28/2014 7* 12 - 46 % Final  . Monocytes Relative 12/28/2014 9  3 - 12 % Final  . Eosinophils Relative 12/28/2014 0  0 - 5 % Final  . Basophils Relative 12/28/2014 0  0  - 1 % Final  . Neutro Abs 12/28/2014 7.4  1.7 - 7.7 K/uL Final  . Lymphs Abs 12/28/2014 0.6* 0.7 - 4.0 K/uL Final  . Monocytes Absolute 12/28/2014 0.8  0.1 - 1.0 K/uL Final  . Eosinophils Absolute 12/28/2014 0.0  0.0 - 0.7 K/uL Final  . Basophils Absolute 12/28/2014 0.0  0.0 - 0.1 K/uL Final  . Smear Review 12/28/2014 MORPHOLOGY UNREMARKABLE   Final     X-Rays:Dg Shoulder Right  12/28/2014   CLINICAL DATA:  Fall off of steps with injury to right shoulder. Initial encounter.  EXAM: RIGHT SHOULDER - 2+ VIEW  COMPARISON:  None.  FINDINGS: Severely comminuted fracture of the proximal humerus noted with significant displacement at the level of the surgical neck as well as a comminuted fracture extending into the region of the greater tuberosity of the humeral head. It is difficult to determine if there is an associated glenoid fracture. The AC joint shows normal alignment. Overlying soft tissue swelling present.  IMPRESSION: Severely comminuted fracture of the proximal humerus with significant displacement at the level of the surgical neck. Comminuted fracture planes extend through the region of the greater tuberosity. CT evaluation may be helpful for surgical planning.   Electronically Signed   By: Glenn  Yamagata M.D.   On: 12/28/2014 16:20   Ct Head Wo Contrast  12/28/2014   CLINICAL DATA:  Fall on steps.  No loss of consciousness.  EXAM: CT HEAD WITHOUT CONTRAST  TECHNIQUE: Contiguous axial images were obtained from the base of the   skull through the vertex without intravenous contrast.  COMPARISON:  CT scan of June 19, 2013.  FINDINGS: Bony calvarium appears intact. Cochlear implant is noted on the right with substantial artifact arising from implant over right parietal skull. No mass effect or midline shift is noted. Ventricular size is within normal limits. There is no evidence of mass lesion, hemorrhage or acute infarction.  IMPRESSION: Exam is somewhat limited due to substantial artifact arising  from right-sided cochlear implant. No definite intracranial abnormality seen.   Electronically Signed   By: Devyon  Green Jr, M.D.   On: 12/28/2014 19:23   Ct Shoulder Right Wo Contrast  12/28/2014   CLINICAL DATA:  78-year-old male status post fall and right shoulder pains all  EXAM: CT OF THE RIGHT SHOULDER WITHOUT CONTRAST  TECHNIQUE: Multidetector CT imaging was performed according to the standard protocol. Multiplanar CT image reconstructions were also generated.  COMPARISON:  Radiograph dated 12/28/2014  FINDINGS: There is comminuted fracture of the right humeral head. There is multi fragmentary fracture of the neck of the right humerus extending into the proximal humeral diaphysis. There is displacement of the humerus at the surgical neck. There is lateral tilting of the distal end of the neck of the humerus at the fracture site. There is diffuse soft tissue swelling and hematoma in the right shoulder.  IMPRESSION: Comminuted and displaced fractures of the head and neck of right humerus.   Electronically Signed   By: Arash  Radparvar M.D.   On: 12/28/2014 20:05   Dg Chest Portable 1 View  12/28/2014   CLINICAL DATA:  78-year-old male post fall and right shoulder fracture. Subsequent encounter.  EXAM: PORTABLE CHEST - 1 VIEW  COMPARISON:  12/28/2014 shoulder films.  06/19/2013 chest x-ray.  FINDINGS: Post CABG.  Calcified tortuous with aorta with tortuosity having increased from the prior exam.  Right base scarring/subsegmental atelectasis.  No gross pneumothorax or segmental consolidation.  No pulmonary edema.  No plain film evidence of pulmonary malignancy.  No obvious right-sided rib fracture.  Right shoulder fracture better demonstrated on earlier films.  IMPRESSION: Tortuous aorta.  Post CABG.  Right base subsegmental atelectasis/ scarring.   Electronically Signed   By: Steven  Olson M.D.   On: 12/28/2014 18:19   Dg Shoulder Right Port  12/30/2014   CLINICAL DATA:  Postop right shoulder ORIF.   EXAM: PORTABLE RIGHT SHOULDER - 2+ VIEW  COMPARISON:  CT, 12/28/2014.  FINDINGS: A fixation plate and multiple screws as well as a single circumferential wire reduces the major fracture fragments of the fracture proximal right humerus into near anatomic alignment. There is surrounding soft tissue swelling. Shoulder joint is normally aligned. No evidence of an operative complication.  IMPRESSION: Well aligned major fracture fragments following ORIF of a proximal right humerus fracture.   Electronically Signed   By: David  Ormond M.D.   On: 12/30/2014 20:02    EKG: Orders placed or performed during the hospital encounter of 12/30/14  . EKG 12-Lead  . EKG 12-Lead     Hospital Course: Juan [name] is a 78 y.o. who was admitted to [facility]. They were brought to the operating room on 12/30/2014 and underwent Procedure(s): Right shoulder ORIF, Biomet S3 Plate and ZImmer cable system .  Patient tolerated the procedure well and was later transferred to the recovery room and then to the orthopaedic floor for postoperative care.  They were given PO and IV analgesics for pain control following their surgery.  They were given   24 hours of postoperative antibiotics of  Anti-infectives    Start     Dose/Rate Route Frequency Ordered Stop   12/30/14 2015  ceFAZolin (ANCEF) IVPB 2 g/50 mL premix     2 g 100 mL/hr over 30 Minutes Intravenous Every 6 hours 12/30/14 2005 12/31/14 0908   12/30/14 0930  ceFAZolin (ANCEF) IVPB 2 g/50 mL premix     2 g 100 mL/hr over 30 Minutes Intravenous To ShortStay Surgical 12/30/14 0919 12/30/14 1440     and started on DVT prophylaxis in the form of Lovenox.   OT was ordered. Discharge planning consulted to help with postop disposition and equipment needs.  Patient had a fair night on the evening of surgery. Dressing was changed on day two and the incision was healing well with minimal drainage from distal aspect of wound.  Plan for DC to SNF when bed available.  Diet:  Cardiac diet Activity:NWB right UE Follow-up:in 2 weeks Disposition - Skilled nursing facility Discharged Condition: stable   Discharge Instructions    Call MD / Call 911    Complete by:  As directed   If you experience chest pain or shortness of breath, CALL 911 and be transported to the hospital emergency room.  If you develope a fever above 101 F, pus (white drainage) or increased drainage or redness at the wound, or calf pain, call your surgeon's office.     Constipation Prevention    Complete by:  As directed   Drink plenty of fluids.  Prune juice may be helpful.  You may use a stool softener, such as Colace (over the counter) 100 mg twice a day.  Use MiraLax (over the counter) for constipation as needed.     Diet - low sodium heart healthy    Complete by:  As directed      Increase activity slowly as tolerated    Complete by:  As directed      Lifting restrictions    Complete by:  As directed   No lifting right UE            Medication List    TAKE these medications        allopurinol 100 MG tablet  Commonly known as:  ZYLOPRIM  Take 1 tablet (100 mg total) by mouth daily.     aspirin EC 81 MG tablet  Take 81 mg by mouth daily.     enoxaparin 40 MG/0.4ML injection  Commonly known as:  LOVENOX  Inject 0.4 mLs (40 mg total) into the skin daily. 30 days post op     Fenofibric Acid 135 MG Cpdr  TAKE ONE CAPSULE BY MOUTH EVERY DAY     finasteride 5 MG tablet  Commonly known as:  PROSCAR  Take 5 mg by mouth daily.     HYDROcodone-acetaminophen 5-325 MG per tablet  Commonly known as:  NORCO/VICODIN  Take 1-2 tablets by mouth every 4 (four) hours as needed.     metoprolol 50 MG tablet  Commonly known as:  LOPRESSOR  Take 1 tablet (50 mg total) by mouth 2 (two) times daily.     pravastatin 20 MG tablet  Commonly known as:  PRAVACHOL  Take 1 tablet (20 mg total) by mouth daily.           Follow-up Information    Follow up with NORRIS,STEVEN R, MD. Call in 2  weeks.   Specialty:  Orthopedic Surgery   Why:  [phone]   Contact information:     [address] [place] [address] [phone]       Signed: Ardeen Jourdain, PA-C Orthopaedic Surgery 01/02/2015, 9:47 AM

## 2015-01-02 NOTE — Progress Notes (Signed)
Subjective: 3 Days Post-Op Procedure(s) (LRB): Right shoulder ORIF, Biomet S3 Plate and ZImmer cable system  (Right) Patient reports pain as moderate.   Patient seen in rounds for Dr. Veverly Fells. Patient is well, and has had no acute complaints or problems. Reports improvement in pain. No issues overnight. Daughter in room to help with communication and for discussion of disposition.   Objective: Vital signs in last 24 hours: Temp:  [98.2 F (36.8 C)-99.3 F (37.4 C)] 98.2 F (36.8 C) (07/17 0436) Pulse Rate:  [68-84] 74 (07/17 0436) Resp:  [15-18] 15 (07/17 0436) BP: (120-153)/(59-81) 153/81 mmHg (07/17 0436) SpO2:  [94 %-97 %] 97 % (07/17 0436)  Intake/Output from previous day:  Intake/Output Summary (Last 24 hours) at 01/02/15 0927 Last data filed at 01/02/15 0048  Gross per 24 hour  Intake    720 ml  Output    500 ml  Net    220 ml     Labs:  Recent Labs  12/30/14 1115 12/31/14 0830  HGB 10.7* 9.8*    Recent Labs  12/30/14 1115 12/31/14 0830  WBC 7.7  --   RBC 3.61*  --   HCT 31.8* 29.0*  PLT 187  --     Recent Labs  12/30/14 1115 12/31/14 0830  NA 140 138  K 4.0 3.5  CL 111 110  CO2 22 22  BUN 37* 22*  CREATININE 1.30* 1.24  GLUCOSE 171* 166*  CALCIUM 9.5 8.3*    Recent Labs  12/30/14 1115  INR 1.18    EXAM General - Patient is Alert and Oriented Extremity - Neurologically intact Neurovascular intact Intact pulses distally Incision: scant drainage Dressing/Incision - blood tinged drainage from distal aspect of incision Motor Function - intact, moving fingers and hand well  Past Medical History  Diagnosis Date  . Hyperlipidemia   . Duodenitis   . Gout   . Diverticular disease   . Elevated PSA   . Tubular adenoma polyp of rectum   . Pancreatitis     a. ? Simvastatin related  . GERD (gastroesophageal reflux disease)   . Hydronephrosis with ureteropelvic junction obstruction   . Hypertension   . TIA (transient ischemic attack)      a. 01/2012 - presentation as acute imbalance;  b. 01/2012 carotid U/S w/o signif stenosis;  c. 01/2012 Echo: EF 60-65%, Gr 1 DD.;  d.  Carotid US (06/20/2013): 1-39% bilateral ICA stenosis  . Hypercalcemia 02/02/12    a. 01/2012: 10.7.  Marland Kitchen Syncope     a. 06/2013  . Pneumonia     "I think only once" (06/19/2013)  . Type II diabetes mellitus     "diet and exercise controlled at present" (06/19/2013)  . Coronary artery disease     a. s/p CABG 2000; b. myoview 9/08: inf-septal scar with mild peri-infarct ischemia (low risk);  c.  Echo 8/13: mild focal basal septal hypertrophy, Gr 1 DD, mild LAE;  d. Lexiscan Myoview (06/20/2013): No reversible ischemia, inferior and septal infarct, inferior and septal hypokinesis, EF 60%;  e.  Echo (06/20/2011): EF 50-55%, normal wall motion, grade 1 diastolic dysfunction, mild LAE  . CKD (chronic kidney disease) 06/24/2013  . Humerus fracture     right  . Cancer     skin    Assessment/Plan: 3 Days Post-Op Procedure(s) (LRB): Right shoulder ORIF, Biomet S3 Plate and ZImmer cable system  (Right) Active Problems:   Shoulder fracture  Estimated body mass index is 24.87 kg/(m^2) as calculated from  the following:   Height as of 09/20/14: 5\' 11"  (1.803 m).   Weight as of this encounter: 80.854 kg (178 lb 4 oz). Advance diet Up with therapy D/C IV fluids Discharge to SNF when bed available  DVT Prophylaxis - Lovenox  Doing well. Will prepare for DC in case placement is available today. Will DC tomorrow if not. Reinforce dressing as needed.   Ardeen Jourdain, PA-C Orthopaedic Surgery 01/02/2015, 9:27 AM

## 2015-01-02 NOTE — Progress Notes (Signed)
Occupational Therapy Treatment Patient Details Name: Juan [name] MRN: [id number] DOB: March 07, 1936 Today's Date: 01/02/2015    History of present illness s/p fall wtih resulting R proximal humerus fracture. Underwent ORIF R humerus.   OT comments  This 79 yo male admitted and underwent above presents to acute OT today with increased mobility and increased ability to A with LUE exercises. Pitting edema noted in mid forearm to mid upper arm. Worked on AROM of arm and retrograde massage and then repositioned in sling. Will continue to benefit from acute OT with follow up at SNF.  Follow Up Recommendations  SNF;Supervision/Assistance - 24 hour    Equipment Recommendations   (TBD at SNF)       Precautions / Restrictions Precautions Precautions: Shoulder Type of Shoulder Precautions: conservative--gentle ADL focus AROM and AAROM to tolerance only, go slow; AROM of elbow, wrist, hand OK Shoulder Interventions: Shoulder sling/immobilizer;At all times;Off for dressing/bathing/exercises Precaution Comments: AROM shoudler with ADL focus . AROM within tolerance. Go slowly. AROM elbow wrist and hand OK Restrictions Weight Bearing Restrictions: Yes RUE Weight Bearing: Non weight bearing       Mobility Bed Mobility Overal bed mobility: Needs Assistance Bed Mobility: Sit to Supine       Sit to supine: Min assist      Transfers Overall transfer level: Needs assistance Equipment used: Straight cane Transfers: Sit to/from Stand Sit to Stand: Min assist         General transfer comment: Pt ambulated with SPC 150 feet with min A (shuffling gait)    Balance           Standing balance support: Single extremity supported Standing balance-Leahy Scale: Poor                     ADL Overall ADL's : Needs assistance/impaired                             Toileting- Clothing Manipulation and Hygiene: Moderate assistance (with min A sit<>stand)                           Cognition   Behavior During Therapy: WFL for tasks assessed/performed Overall Cognitive Status: Impaired/Different from baseline       Memory: Decreased short-term memory                 Exercises Other Exercises Other Exercises: 20 reps of AAROM lap slides and 40 reps of AAROM elbow flexion/extension with 3-5 minutes of retrograde massage after each of 10 reps;            Pertinent Vitals/ Pain       Pain Assessment: Faces Faces Pain Scale: Hurts little more Pain Location: right shoulder with lap slides and gentle AAROM of shoulder and elbow (edematous--pitting) Pain Descriptors / Indicators: Aching;Guarding;Grimacing Pain Intervention(s): Monitored during session;Repositioned         Frequency Min 3X/week        Plan Discharge plan remains appropriate       End of Session Equipment Utilized During Treatment:  (sling, SPC)   Activity Tolerance Patient tolerated treatment well   Patient Left in chair;with call bell/phone within reach;with chair alarm set   Nurse Communication          Time: 2694-8546 OT Time Calculation (min): 32 min  Charges: OT General Charges $OT Visit: 1 Procedure OT Treatments $Self Care/Home Management :  8-22 mins $Therapeutic Exercise: 8-22 mins  Almon Register [phone] 01/02/2015, 11:29 AM

## 2015-01-02 NOTE — Clinical Social Work Placement (Signed)
   CLINICAL SOCIAL WORK PLACEMENT  NOTE  Date:  01/02/2015  Patient Details  Name: JULIOCESAR BLASIUS MRN: [id number] Date of Birth: Apr 07, 1936  Clinical Social Work is seeking post-discharge placement for this patient at the [place] level of care (*CSW will initial, date and re-position this form in  chart as items are completed):  Yes   Patient/family provided with [place] Work Department's list of facilities offering this level of care within the geographic area requested by the patient (or if unable, by the patient's family).  Yes   Patient/family informed of their freedom to choose among providers that offer the needed level of care, that participate in Medicare, Medicaid or managed care program needed by the patient, have an available bed and are willing to accept the patient.  Yes   Patient/family informed of [facility]'s ownership interest in [facility] and [facility], as well as of the fact that they are under no obligation to receive care at these facilities.  PASRR submitted to EDS on  7/17    PASRR number received on     7/17  Existing PASRR number confirmed on       FL2 transmitted to all facilities in geographic area requested by pt/family on       FL2 transmitted to all facilities within larger geographic area on       Patient informed that his/her managed care company has contracts with or will negotiate with certain facilities, including the following:            Patient/family informed of bed offers received.7/17  Patient chooses bed at     [facility]  Physician recommends and patient chooses bed at     Patient to be transferred to   on  .7/17  Patient to be transferred to facility by     ambulance  Patient family notified on   of transfer.yes  Name of family member notified:      Mrs. [name]  PHYSICIAN Please prepare priority discharge summary, including medications, Please prepare prescriptions, Please sign  FL2     Additional Comment:    _______________________________________________ Roanna Raider, LCSW 01/02/2015, 4:13 PM

## 2015-01-03 ENCOUNTER — Encounter (HOSPITAL_COMMUNITY): Payer: Self-pay | Admitting: Orthopedic Surgery

## 2015-01-03 ENCOUNTER — Non-Acute Institutional Stay (SKILLED_NURSING_FACILITY): Payer: Federal, State, Local not specified - PPO | Admitting: Internal Medicine

## 2015-01-03 ENCOUNTER — Telehealth: Payer: Self-pay

## 2015-01-03 DIAGNOSIS — Z8639 Personal history of other endocrine, nutritional and metabolic disease: Secondary | ICD-10-CM

## 2015-01-03 DIAGNOSIS — S42201S Unspecified fracture of upper end of right humerus, sequela: Secondary | ICD-10-CM | POA: Diagnosis not present

## 2015-01-03 DIAGNOSIS — Z8739 Personal history of other diseases of the musculoskeletal system and connective tissue: Secondary | ICD-10-CM

## 2015-01-03 DIAGNOSIS — D62 Acute posthemorrhagic anemia: Secondary | ICD-10-CM | POA: Diagnosis not present

## 2015-01-03 DIAGNOSIS — I10 Essential (primary) hypertension: Secondary | ICD-10-CM | POA: Diagnosis not present

## 2015-01-03 DIAGNOSIS — K5901 Slow transit constipation: Secondary | ICD-10-CM

## 2015-01-03 DIAGNOSIS — E785 Hyperlipidemia, unspecified: Secondary | ICD-10-CM | POA: Diagnosis not present

## 2015-01-03 LAB — GLUCOSE, CAPILLARY: Glucose-Capillary: 192 mg/dL — ABNORMAL HIGH (ref 65–99)

## 2015-01-03 NOTE — Progress Notes (Signed)
Patient ID: Juan [name], male   DOB: [birth year], 79 y.o.   MRN: [id number]     [place]  PCP: Unice Cobble, MD  Code Status: Full Code   Allergies  Allergen Reactions  . Simvastatin     REACTION:  Possible statin induced pancreatitis    Chief Complaint  Patient presents with  . New Admit To SNF    New Admission      HPI:  79 y.o. patient is here for short term rehabilitation post hospital admission from 12/30/14-01/02/15 post fall with right humerus fracture. He underwent open reduction and internal fixation. He is seen in his room today. His pain is not under control with current pain regimen. He also has been constipated.   Review of Systems:  Constitutional: Negative for fever, chills, diaphoresis.  HENT: Negative for headache, congestion, nasal discharge Eyes: Negative for eye pain, blurred vision, double vision and discharge.  Respiratory: Negative for cough, shortness of breath and wheezing.   Cardiovascular: Negative for chest pain, palpitations, leg swelling.  Gastrointestinal: Negative for heartburn, nausea, vomiting, abdominal pain Genitourinary: Negative for dysuria.  Musculoskeletal: Negative for back pain, falls Skin: Negative for itching, rash.  Neurological: Negative for dizziness, tingling, focal weakness Psychiatric/Behavioral: Negative for depression   Past Medical History  Diagnosis Date  . Hyperlipidemia   . Duodenitis   . Gout   . Diverticular disease   . Elevated PSA   . Tubular adenoma polyp of rectum   . Pancreatitis     a. ? Simvastatin related  . GERD (gastroesophageal reflux disease)   . Hydronephrosis with ureteropelvic junction obstruction   . Hypertension   . TIA (transient ischemic attack)     a. 01/2012 - presentation as acute imbalance;  b. 01/2012 carotid U/S w/o signif stenosis;  c. 01/2012 Echo: EF 60-65%, Gr 1 DD.;  d.  Carotid US (06/20/2013): 1-39% bilateral ICA stenosis  . Hypercalcemia 02/02/12    a. 01/2012:  10.7.  Marland Kitchen Syncope     a. 06/2013  . Pneumonia     "I think only once" (06/19/2013)  . Type II diabetes mellitus     "diet and exercise controlled at present" (06/19/2013)  . Coronary artery disease     a. s/p CABG 2000; b. myoview 9/08: inf-septal scar with mild peri-infarct ischemia (low risk);  c.  Echo 8/13: mild focal basal septal hypertrophy, Gr 1 DD, mild LAE;  d. Lexiscan Myoview (06/20/2013): No reversible ischemia, inferior and septal infarct, inferior and septal hypokinesis, EF 60%;  e.  Echo (06/20/2011): EF 50-55%, normal wall motion, grade 1 diastolic dysfunction, mild LAE  . CKD (chronic kidney disease) 06/24/2013  . Humerus fracture     right  . Cancer     skin   Past Surgical History  Procedure Laterality Date  . Cochlear implant Right 1990's  . Appendectomy    . Cholecystectomy    . Colonoscopy w/ polypectomy      no polyp 2012  . Parathyroidectomy  1978  . Polypectomy    . Cardiac catheterization  2000  . Coronary artery bypass graft  2000    CABG X5  . Tonsillectomy  1943  . Ankle surgery Right ~ 1943    "don't know what for"  . Hernia repair    . Reverse shoulder arthroplasty Right 12/30/2014    Procedure: Right shoulder ORIF, Biomet S3 Plate and ZImmer cable system ;  Surgeon: Netta Cedars, MD;  Location: [facility];  Service: Orthopedics;  Laterality: Right;   Social History:   reports that he has been smoking Cigars.  He has never used smokeless tobacco. He reports that he drinks alcohol. He reports that he does not use illicit drugs.  Family History  Problem Relation Age of Onset  . Melanoma Father   . Heart attack Father      in 44s  . Diabetes Neg Hx     Medications:   Medication List       This list is accurate as of: 01/03/15  1:46 PM.  Always use your most recent med list.               allopurinol 100 MG tablet  Commonly known as:  ZYLOPRIM  Take 1 tablet (100 mg total) by mouth daily.     aspirin EC 81 MG tablet  Take 81 mg by mouth daily.       enoxaparin 40 MG/0.4ML injection  Commonly known as:  LOVENOX  Inject 0.4 mLs (40 mg total) into the skin daily. 30 days post op     Fenofibric Acid 135 MG Cpdr  TAKE ONE CAPSULE BY MOUTH EVERY DAY     finasteride 5 MG tablet  Commonly known as:  PROSCAR  Take 5 mg by mouth daily.     HYDROcodone-acetaminophen 5-325 MG per tablet  Commonly known as:  NORCO/VICODIN  Take 1-2 tablets by mouth every 4 (four) hours as needed.     metoprolol 50 MG tablet  Commonly known as:  LOPRESSOR  Take 1 tablet (50 mg total) by mouth 2 (two) times daily.     pravastatin 20 MG tablet  Commonly known as:  PRAVACHOL  Take 1 tablet (20 mg total) by mouth daily.         Physical Exam: Filed Vitals:   01/03/15 1340  BP: 129/75  Pulse: 80  Temp: 99.9 F (37.7 C)  TempSrc: Oral  Resp: 18  SpO2: 94%    General- elderly male, in no acute distress Head- normocephalic, atraumatic Throat- moist mucus membrane Eyes- no pallor, no icterus, no discharge, normal conjunctiva, normal sclera Neck- no cervical lymphadenopathy Cardiovascular- normal s1,s2, no murmurs/ rubs/ gallops, palpable dorsalis pedis and radial pulses, no leg edema Respiratory- bilateral clear to auscultation, no wheeze, no rhonchi, no crackles, no use of accessory muscles Abdomen- bowel sounds present, soft, non tender Musculoskeletal- right arm limited ROM, NWB to right UE, can move his fingers, good capillary refill, able to move all other 3 extremities Neurological- no focal deficit, alert and oriented  Skin- warm and dry Psychiatry- normal mood and affect    Labs reviewed: Basic Metabolic Panel:  Recent Labs  12/28/14 1801 12/30/14 1115 12/31/14 0830  NA 139 140 138  K 3.7 4.0 3.5  CL 107 111 110  CO2 22 22 22   GLUCOSE 233* 171* 166*  BUN 37* 37* 22*  CREATININE 1.35* 1.30* 1.24  CALCIUM 10.1 9.5 8.3*   Liver Function Tests:  Recent Labs  02/04/14 0736 09/20/14 1524  AST 17 18  ALT 11 15  ALKPHOS  39 49  BILITOT 0.6 0.4  PROT 7.4 6.8  ALBUMIN 3.7 4.0   No results for input(s): LIPASE, AMYLASE in the last 8760 hours. No results for input(s): AMMONIA in the last 8760 hours. CBC:  Recent Labs  12/28/14 1801 12/30/14 1115 12/31/14 0830  WBC 8.8 7.7  --   NEUTROABS 7.4 5.9  --   HGB 13.3 10.7* 9.8*  HCT  39.9 31.8* 29.0*  MCV 89.1 88.1  --   PLT 203 187  --    Cardiac Enzymes:  Recent Labs  02/04/14 0736 09/20/14 1524  CKTOTAL 165 138   BNP: Invalid input(s): POCBNP CBG:  Recent Labs  12/31/14 1651 12/31/14 2205 01/01/15 0627  GLUCAP 153* 163* 148*    Radiological Exams: Dg Shoulder Right Port  12/30/2014   CLINICAL DATA:  Postop right shoulder ORIF.  EXAM: PORTABLE RIGHT SHOULDER - 2+ VIEW  COMPARISON:  CT, 12/28/2014.  FINDINGS: A fixation plate and multiple screws as well as a single circumferential wire reduces the major fracture fragments of the fracture proximal right humerus into near anatomic alignment. There is surrounding soft tissue swelling. Shoulder joint is normally aligned. No evidence of an operative complication.  IMPRESSION: Well aligned major fracture fragments following ORIF of a proximal right humerus fracture.   Electronically Signed   By: Lajean Manes M.D.   On: 12/30/2014 20:02    Assessment/Plan  Right humerus fracture S/p ORIF. NWB to RUE for now. Has follow up with orthopedics. Will have him work with physical therapy and occupational therapy team to help with gait training and muscle strengthening exercises.fall precautions. Skin care. Encourage to be out of bed. Continue his norco  5-325 mg 1-2 tab q4h prn and add norco 5-325  Tab tid scheduled. Reassess pain. Continue lovenox for dvt prophylaxis  Blood loss anemia Post op, monitor h&h Lab Results  Component Value Date   WBC 7.7 12/30/2014   HGB 9.8* 12/31/2014   HCT 29.0* 12/31/2014   MCV 88.1 12/30/2014   PLT 187 12/30/2014   Constipation Add colace 100 mg bid with miralax  17 g daily and reassess. Hydration encouraged  HTN Continue lopressor 50 mg bid and baby aspirin, monitor bp  HLD Continue pravachol 20 mg daily and fenofibric acid  Gout No recent attack, continue allopurinol 100 mg daily  Goals of care: short term rehabilitation   Labs/tests ordered: cbc, bmp 1 week  Family/ staff Communication: reviewed care plan with patient and nursing supervisor    Blanchie Serve, MD  [place] (Monday-Friday 8 am - 5 pm) [phone] (afterhours)

## 2015-01-03 NOTE — Telephone Encounter (Signed)
Pt is on TCM list. DC'ed to SNF. DX for admission was closed fracture of right proximal humerus.

## 2015-01-11 ENCOUNTER — Encounter (HOSPITAL_COMMUNITY): Payer: Self-pay | Admitting: Orthopedic Surgery

## 2015-01-14 ENCOUNTER — Other Ambulatory Visit: Payer: Self-pay | Admitting: *Deleted

## 2015-01-14 MED ORDER — HYDROCODONE-ACETAMINOPHEN 5-325 MG PO TABS: ORAL_TABLET | ORAL | Status: DC

## 2015-01-14 NOTE — Telephone Encounter (Signed)
[facility] 

## 2015-01-20 ENCOUNTER — Telehealth: Payer: Self-pay | Admitting: Internal Medicine

## 2015-01-20 NOTE — Telephone Encounter (Signed)
pts daughter, [name] called and patient recently fell and had surgery and had his follow up appt with surgeon. He is at [facility] right now and they told her that he needs to follow up with Dr. Linna Darner. She states everything went well and he is doing good and she is wondering if he really does need to come in. Would Dr. Linna Darner like him to come in for an OV Please advise [name] [phone]

## 2015-01-20 NOTE — Telephone Encounter (Signed)
Please advise 

## 2015-01-21 ENCOUNTER — Non-Acute Institutional Stay (SKILLED_NURSING_FACILITY): Payer: Federal, State, Local not specified - PPO | Admitting: Adult Health

## 2015-01-21 DIAGNOSIS — K5901 Slow transit constipation: Secondary | ICD-10-CM | POA: Diagnosis not present

## 2015-01-21 DIAGNOSIS — D62 Acute posthemorrhagic anemia: Secondary | ICD-10-CM | POA: Diagnosis not present

## 2015-01-21 DIAGNOSIS — S42201S Unspecified fracture of upper end of right humerus, sequela: Secondary | ICD-10-CM

## 2015-01-21 DIAGNOSIS — Z8639 Personal history of other endocrine, nutritional and metabolic disease: Secondary | ICD-10-CM

## 2015-01-21 DIAGNOSIS — N400 Enlarged prostate without lower urinary tract symptoms: Secondary | ICD-10-CM | POA: Diagnosis not present

## 2015-01-21 DIAGNOSIS — E785 Hyperlipidemia, unspecified: Secondary | ICD-10-CM | POA: Diagnosis not present

## 2015-01-21 DIAGNOSIS — I10 Essential (primary) hypertension: Secondary | ICD-10-CM | POA: Diagnosis not present

## 2015-01-21 DIAGNOSIS — Z8739 Personal history of other diseases of the musculoskeletal system and connective tissue: Secondary | ICD-10-CM

## 2015-01-21 NOTE — Telephone Encounter (Signed)
See prn

## 2015-01-21 NOTE — Telephone Encounter (Signed)
Informed daughter

## 2015-01-23 ENCOUNTER — Encounter: Payer: Self-pay | Admitting: Adult Health

## 2015-01-23 NOTE — Progress Notes (Signed)
Patient ID: Juan [name], male   DOB: [birth year], 79 y.o.   MRN: [id number]    DATE: 01/21/15 MRN:  [id number]  BIRTHDAY: Apr 12, 1936  Facility:  [facility]ing Home Location:  [facility] Room Number: [id number]  LEVEL OF CARE:  SNF (31)  Contact Information    Name Relation Home Work [place] Daughter   [phone]   [name],[name] Daughter   [phone]       Chief Complaint  Patient presents with  . Discharge Note    Right humerus fracture S/P ORIF, gout, hyperlipidemia, BPH, constipation, hypertension and anemia    HISTORY OF PRESENT ILLNESS:  This is a 79 year old male who is for discharge home with Home health PT for endurance, OT for ADLs and CNA for showers. He has been admitted to [facility] on 01/02/15 from [facility]. He had a fall and sustained a right shoulder fracture for which he had ORIF on 7/14.   Patient was admitted to this facility for short-term rehabilitation after the patient's recent hospitalization.  Patient has completed SNF rehabilitation and therapy has cleared the patient for discharge.  PAST MEDICAL HISTORY:  Past Medical History  Diagnosis Date  . Hyperlipidemia   . Duodenitis   . Gout   . Diverticular disease   . Elevated PSA   . Tubular adenoma polyp of rectum   . Pancreatitis     a. ? Simvastatin related  . GERD (gastroesophageal reflux disease)   . Hydronephrosis with ureteropelvic junction obstruction   . Hypertension   . TIA (transient ischemic attack)     a. 01/2012 - presentation as acute imbalance;  b. 01/2012 carotid U/S w/o signif stenosis;  c. 01/2012 Echo: EF 60-65%, Gr 1 DD.;  d.  Carotid US (06/20/2013): 1-39% bilateral ICA stenosis  . Hypercalcemia 02/02/12    a. 01/2012: 10.7.  Marland Kitchen Syncope     a. 06/2013  . Pneumonia     "I think only once" (06/19/2013)  . Type II diabetes mellitus     "diet and exercise controlled at present" (06/19/2013)  . Coronary artery disease     a. s/p CABG 2000;  b. myoview 9/08: inf-septal scar with mild peri-infarct ischemia (low risk);  c.  Echo 8/13: mild focal basal septal hypertrophy, Gr 1 DD, mild LAE;  d. Lexiscan Myoview (06/20/2013): No reversible ischemia, inferior and septal infarct, inferior and septal hypokinesis, EF 60%;  e.  Echo (06/20/2011): EF 50-55%, normal wall motion, grade 1 diastolic dysfunction, mild LAE  . CKD (chronic kidney disease) 06/24/2013  . Humerus fracture     right  . Cancer     skin     CURRENT MEDICATIONS: Reviewed    Medication List       This list is accurate as of: 01/21/15 11:59 PM.  Always use your most recent med list.               allopurinol 100 MG tablet  Commonly known as:  ZYLOPRIM  Take 1 tablet (100 mg total) by mouth daily.     aspirin EC 81 MG tablet  Take 81 mg by mouth daily.     enoxaparin 40 MG/0.4ML injection  Commonly known as:  LOVENOX  Inject 0.4 mLs (40 mg total) into the skin daily. 30 days post op     Fenofibric Acid 135 MG Cpdr  TAKE ONE CAPSULE BY MOUTH EVERY DAY     finasteride 5 MG tablet  Commonly known as:  PROSCAR  Take 5 mg by mouth daily.     HYDROcodone-acetaminophen 5-325 MG per tablet  Commonly known as:  NORCO/VICODIN  Take 1 tablet by mouth every 4 (four) hours as needed for moderate pain.     metoprolol 50 MG tablet  Commonly known as:  LOPRESSOR  Take 1 tablet (50 mg total) by mouth 2 (two) times daily.     pravastatin 20 MG tablet  Commonly known as:  PRAVACHOL  Take 1 tablet (20 mg total) by mouth daily.         Allergies  Allergen Reactions  . Simvastatin     REACTION:  Possible statin induced pancreatitis     REVIEW OF SYSTEMS:  GENERAL: no change in appetite, no fatigue, no weight changes, no fever, chills or weakness EYES: Denies change in vision, dry eyes, eye pain, itching or discharge EARS: Denies change in hearing, ringing in ears, or earache NOSE: Denies nasal congestion or epistaxis MOUTH and THROAT: Denies oral discomfort,  gingival pain or bleeding, pain from teeth or hoarseness   RESPIRATORY: no cough, SOB, DOE, wheezing, hemoptysis CARDIAC: no chest pain, edema or palpitations GI: no abdominal pain, diarrhea, constipation, heart burn, nausea or vomiting GU: Denies dysuria, frequency, hematuria, incontinence, or discharge PSYCHIATRIC: Denies feeling of depression or anxiety. No report of hallucinations, insomnia, paranoia, or agitation   PHYSICAL EXAMINATION  GENERAL APPEARANCE: Well nourished. In no acute distress. Normal body habitus SKIN:  Right shoulder surgical site with steri-strips, dry and no erythema HEAD: Normal in size and contour. No evidence of trauma EYES: Lids open and close normally. No blepharitis, entropion or ectropion. PERRL. Conjunctivae are clear and sclerae are white. Lenses are without opacity EARS: Pinnae are normal. Patient hears normal voice tunes of the examiner MOUTH and THROAT: Lips are without lesions. Oral mucosa is moist and without lesions. Tongue is normal in shape, size, and color and without lesions NECK: supple, trachea midline, no neck masses, no thyroid tenderness, no thyromegaly LYMPHATICS: no LAN in the neck, no supraclavicular LAN RESPIRATORY: breathing is even & unlabored, BS CTAB CARDIAC: RRR, no murmur,no extra heart sounds, no edema GI: abdomen soft, normal BS, no masses, no tenderness, no hepatomegaly, no splenomegaly EXTREMITIES:  Able to move X 4 extremities; limited ROM on right shoulder PSYCHIATRIC: Alert and oriented X 3. Affect and behavior are appropriate  LABS/RADIOLOGY: Labs reviewed: 01/10/15  WBC 5.8 hemoglobin 10.0 hematocrit 29.1 MCV 87.4 legs at 372 sodium 139 potassium 3.9 glucose 141 BUN 22 creatinine 1.25 calcium 9.6 Basic Metabolic Panel:  Recent Labs  12/28/14 1801 12/30/14 1115 12/31/14 0830  NA 139 140 138  K 3.7 4.0 3.5  CL 107 111 110  CO2 22 22 22   GLUCOSE 233* 171* 166*  BUN 37* 37* 22*  CREATININE 1.35* 1.30* 1.24    CALCIUM 10.1 9.5 8.3*   Liver Function Tests:  Recent Labs  02/04/14 0736 09/20/14 1524  AST 17 18  ALT 11 15  ALKPHOS 39 49  BILITOT 0.6 0.4  PROT 7.4 6.8  ALBUMIN 3.7 4.0   No results for input(s): LIPASE, AMYLASE in the last 8760 hours. No results for input(s): AMMONIA in the last 8760 hours. CBC:  Recent Labs  12/28/14 1801 12/30/14 1115 12/31/14 0830  WBC 8.8 7.7  --   NEUTROABS 7.4 5.9  --   HGB 13.3 10.7* 9.8*  HCT 39.9 31.8* 29.0*  MCV 89.1 88.1  --   PLT 203 187  --  Lipid Panel:  Recent Labs  02/04/14 0736 09/20/14 1524  HDL 34.10* 34.70*   Cardiac Enzymes:  Recent Labs  02/04/14 0736 09/20/14 1524  CKTOTAL 165 138   BNP: Invalid input(s): POCBNP CBG:  Recent Labs  12/31/14 1651 12/31/14 2205 01/01/15 0627  GLUCAP 153* 163* 148*    Dg Shoulder Right  12/28/2014   CLINICAL DATA:  Fall off of steps with injury to right shoulder. Initial encounter.  EXAM: RIGHT SHOULDER - 2+ VIEW  COMPARISON:  None.  FINDINGS: Severely comminuted fracture of the proximal humerus noted with significant displacement at the level of the surgical neck as well as a comminuted fracture extending into the region of the greater tuberosity of the humeral head. It is difficult to determine if there is an associated glenoid fracture. The [facility] joint shows normal alignment. Overlying soft tissue swelling present.  IMPRESSION: Severely comminuted fracture of the proximal humerus with significant displacement at the level of the surgical neck. Comminuted fracture planes extend through the region of the greater tuberosity. CT evaluation may be helpful for surgical planning.   Electronically Signed   By: Aletta Edouard M.D.   On: 12/28/2014 16:20   Ct Head Wo Contrast  12/28/2014   CLINICAL DATA:  Fall on steps.  No loss of consciousness.  EXAM: CT HEAD WITHOUT CONTRAST  TECHNIQUE: Contiguous axial images were obtained from the base of the skull through the vertex without  intravenous contrast.  COMPARISON:  CT scan of June 19, 2013.  FINDINGS: Bony calvarium appears intact. Cochlear implant is noted on the right with substantial artifact arising from implant over right parietal skull. No mass effect or midline shift is noted. Ventricular size is within normal limits. There is no evidence of mass lesion, hemorrhage or acute infarction.  IMPRESSION: Exam is somewhat limited due to substantial artifact arising from right-sided cochlear implant. No definite intracranial abnormality seen.   Electronically Signed   By: Marijo Conception, M.D.   On: 12/28/2014 19:23   Ct Shoulder Right Wo Contrast  12/28/2014   CLINICAL DATA:  79 year old male status post fall and right shoulder pains all  EXAM: CT OF THE RIGHT SHOULDER WITHOUT CONTRAST  TECHNIQUE: Multidetector CT imaging was performed according to the standard protocol. Multiplanar CT image reconstructions were also generated.  COMPARISON:  Radiograph dated 12/28/2014  FINDINGS: There is comminuted fracture of the right humeral head. There is multi fragmentary fracture of the neck of the right humerus extending into the proximal humeral diaphysis. There is displacement of the humerus at the surgical neck. There is lateral tilting of the distal end of the neck of the humerus at the fracture site. There is diffuse soft tissue swelling and hematoma in the right shoulder.  IMPRESSION: Comminuted and displaced fractures of the head and neck of right humerus.   Electronically Signed   By: Anner Crete M.D.   On: 12/28/2014 20:05   Dg Chest Portable 1 View  12/28/2014   CLINICAL DATA:  79 year old male post fall and right shoulder fracture. Subsequent encounter.  EXAM: PORTABLE CHEST - 1 VIEW  COMPARISON:  12/28/2014 shoulder films.  06/19/2013 chest x-ray.  FINDINGS: Post CABG.  Calcified tortuous with aorta with tortuosity having increased from the prior exam.  Right base scarring/subsegmental atelectasis.  No gross pneumothorax or  segmental consolidation.  No pulmonary edema.  No plain film evidence of pulmonary malignancy.  No obvious right-sided rib fracture.  Right shoulder fracture better demonstrated on earlier films.  IMPRESSION:  Tortuous aorta.  Post CABG.  Right base subsegmental atelectasis/ scarring.   Electronically Signed   By: Genia Del M.D.   On: 12/28/2014 18:19   Dg Shoulder Right Port  12/30/2014   CLINICAL DATA:  Postop right shoulder ORIF.  EXAM: PORTABLE RIGHT SHOULDER - 2+ VIEW  COMPARISON:  CT, 12/28/2014.  FINDINGS: A fixation plate and multiple screws as well as a single circumferential wire reduces the major fracture fragments of the fracture proximal right humerus into near anatomic alignment. There is surrounding soft tissue swelling. Shoulder joint is normally aligned. No evidence of an operative complication.  IMPRESSION: Well aligned major fracture fragments following ORIF of a proximal right humerus fracture.   Electronically Signed   By: Lajean Manes M.D.   On: 12/30/2014 20:02    ASSESSMENT/PLAN:  Right humerus fracture S/P ORIF - for home health PT, OT and CNA; continue Lovenox 40 mg subcutaneous by mouth daily. 01/29/15 for DVT prophylaxis; and Norco 5/325 mg 1 tab by mouth every 4 hours when necessary  Gout - continue allopurinol 100 mg 1 tab by mouth daily  Hyperlipidemia - continue fenofibric 135 mg DR 1 capsule by mouth daily and pravastatin 20 mg 1 tab by mouth every Monday-Wednesday-Friday  BPH - continue Proscar 5 mg 1 tab by mouth daily  Constipation - continue MiraLAX 17 g by mouth daily and Colace 100 mg 1 capsule by mouth twice a day  Hypertension - continue Lopressor 50 mg 1 tab by mouth twice a day  Anemia, acute blood loss - hemoglobin 10.0; stable     I have filled out patient's discharge paperwork and written prescriptions.  Patient will receive home health PT, OT and CNA.  Total discharge time: Less than 30 minutes  Discharge time involved coordination of the  discharge process with Education officer, museum, nursing staff and therapy department. Medical justification for home health services verified.    [facility], NP [employer] [phone]

## 2015-01-30 ENCOUNTER — Other Ambulatory Visit: Payer: Self-pay | Admitting: Internal Medicine

## 2015-01-31 ENCOUNTER — Other Ambulatory Visit: Payer: Self-pay | Admitting: Emergency Medicine

## 2015-01-31 MED ORDER — FENOFIBRIC ACID 135 MG PO CPDR: 1.0000 | DELAYED_RELEASE_CAPSULE | Freq: Every day | ORAL | Status: DC

## 2015-02-10 ENCOUNTER — Encounter (HOSPITAL_COMMUNITY): Payer: Self-pay | Admitting: Orthopedic Surgery

## 2015-02-10 DIAGNOSIS — S42291D Other displaced fracture of upper end of right humerus, subsequent encounter for fracture with routine healing: Secondary | ICD-10-CM

## 2015-03-02 ENCOUNTER — Ambulatory Visit (INDEPENDENT_AMBULATORY_CARE_PROVIDER_SITE_OTHER): Payer: Federal, State, Local not specified - PPO | Admitting: Internal Medicine

## 2015-03-02 ENCOUNTER — Encounter: Payer: Self-pay | Admitting: Internal Medicine

## 2015-03-02 VITALS — BP 100/68 | HR 67 | Temp 97.8°F | Resp 16 | Wt 172.0 lb

## 2015-03-02 DIAGNOSIS — Z23 Encounter for immunization: Secondary | ICD-10-CM

## 2015-03-02 DIAGNOSIS — R269 Unspecified abnormalities of gait and mobility: Secondary | ICD-10-CM | POA: Diagnosis not present

## 2015-03-02 DIAGNOSIS — R42 Dizziness and giddiness: Secondary | ICD-10-CM | POA: Diagnosis not present

## 2015-03-02 DIAGNOSIS — I10 Essential (primary) hypertension: Secondary | ICD-10-CM

## 2015-03-02 DIAGNOSIS — I951 Orthostatic hypotension: Secondary | ICD-10-CM

## 2015-03-02 NOTE — Patient Instructions (Signed)
Perform isometric exercise of calves  ( while seated go up on toes to count of 5 & then onto heels for 5 count). Repeat  4- 5 times prior to standing if you've been seated or supine for any significant period of time as BP drops with such positions.   Please perform your exercises while seated.  Please discuss the need for any supportive equipment such as a rolling walker when you see your Orthopedist.   Minimal Blood Pressure Goal= AVERAGE < 140/90;  Ideal is an AVERAGE < 135/85. This AVERAGE should be calculated from @ least 5-7 BP readings taken @ different times of day on different days of week. You should not respond to isolated BP readings , but rather the AVERAGE for that week .Please bring your  blood pressure cuff to office visits to verify that it is reliable.It  can also be checked against the blood pressure device at the pharmacy. Finger or wrist cuffs are not dependable; an arm cuff is.

## 2015-03-02 NOTE — Progress Notes (Signed)
   Subjective:    Patient ID: Juan [name], male    DOB: 1935-11-12, 79 y.o.   MRN: [id number]  HPI   He's had 3 episodes of dizziness since 02/24/15. The first occurred when he leaned over while seated and fell onto his knees. There is no muscluloskeletal injury or loss of consciousness  The second and third were 9/9 and 02/28/15. These occurred while he was standing in exercise class doing foot movements. He been up for several minutes and noted dizziness which lasted several minutes. This second episode was not as severe as the first.  There was no cardiac or neurologic prodrome prior to the event.   He has no associated upper respiratory tract infection symptoms. He has no bleeding dyscrasias.  Significantly he had a mechanical fall tripping in July of this year fracturing the shoulder and humerus. He had surgery and was in rehabilitation.  Review of Systems  Denied were any change in heart rhythm or rate prior to the event. There was no associated chest pain or shortness of breath . Also specifically denied prior to the episode were headache, limb weakness, tingling, or numbness. No seizure activity noted. Frontal headache, facial pain , nasal purulence, dental pain, sore throat , otic pain or otic discharge denied. No fever , chills or sweats.Epistaxis, hemoptysis, hematuria, melena, or rectal bleeding denied. No unexplained weight loss, significant dyspepsia,dysphagia, or abdominal pain.  There is no abnormal bruising , bleeding, or difficulty stopping bleeding with injury.    Objective:   Physical Exam Pertinent or positive findings include: He has a cochlear implant on the right. Left tympanic membrane is dull. Ptosis is greater on the left than the right. There is asymmetry to the nasolabial folds. When he stands he is obviously unstable.He has a broad ,slightly slapping gait. Some atrophy of the lower extremities is suggested. He has decreased range of motion of the right upper  extremity.  General appearance :adequately nourished; in no distress.  Eyes: No conjunctival inflammation or scleral icterus is present.  Oral exam:  Lips and gums are healthy appearing.There is no oropharyngeal erythema or exudate noted. Dental hygiene is good.  Heart:  Normal rate and regular rhythm. S1 and S2 normal without gallop, murmur, click, rub or other extra sounds    Lungs:Chest clear to auscultation; no wheezes, rhonchi,rales ,or rubs present.No increased work of breathing.   Abdomen: bowel sounds normal, soft and non-tender without masses, organomegaly or hernias noted.  No guarding or rebound. No flank tenderness to percussion.  Vascular : all pulses equal ; no bruits present.  Skin:Warm & dry.  Intact without suspicious lesions or rashes ; no tenting or jaundice   Lymphatic: No lymphadenopathy is noted about the head, neck, axilla.   Neuro: Strength, tone are markedly decreased.      Assessment & Plan:  #1 positional dizziness  #2  hypotension  #3 profound gait instability  Plan: See orders and recommendations

## 2015-03-02 NOTE — Progress Notes (Signed)
Pre visit review using our clinic review tool, if applicable. No additional management support is needed unless otherwise documented below in the visit note. 

## 2015-03-08 DIAGNOSIS — S42291D Other displaced fracture of upper end of right humerus, subsequent encounter for fracture with routine healing: Secondary | ICD-10-CM | POA: Diagnosis not present

## 2015-06-21 ENCOUNTER — Other Ambulatory Visit: Payer: Self-pay | Admitting: Internal Medicine

## 2015-06-29 ENCOUNTER — Other Ambulatory Visit (INDEPENDENT_AMBULATORY_CARE_PROVIDER_SITE_OTHER): Payer: Federal, State, Local not specified - PPO

## 2015-06-29 ENCOUNTER — Ambulatory Visit (INDEPENDENT_AMBULATORY_CARE_PROVIDER_SITE_OTHER): Payer: Federal, State, Local not specified - PPO | Admitting: Internal Medicine

## 2015-06-29 ENCOUNTER — Encounter: Payer: Self-pay | Admitting: Internal Medicine

## 2015-06-29 VITALS — BP 134/74 | HR 78 | Temp 97.5°F | Resp 16 | Wt 170.0 lb

## 2015-06-29 DIAGNOSIS — Z8739 Personal history of other diseases of the musculoskeletal system and connective tissue: Secondary | ICD-10-CM

## 2015-06-29 DIAGNOSIS — Z8639 Personal history of other endocrine, nutritional and metabolic disease: Secondary | ICD-10-CM

## 2015-06-29 DIAGNOSIS — I10 Essential (primary) hypertension: Secondary | ICD-10-CM | POA: Diagnosis not present

## 2015-06-29 DIAGNOSIS — Z23 Encounter for immunization: Secondary | ICD-10-CM

## 2015-06-29 DIAGNOSIS — N189 Chronic kidney disease, unspecified: Secondary | ICD-10-CM

## 2015-06-29 DIAGNOSIS — R7303 Prediabetes: Secondary | ICD-10-CM | POA: Diagnosis not present

## 2015-06-29 DIAGNOSIS — G459 Transient cerebral ischemic attack, unspecified: Secondary | ICD-10-CM

## 2015-06-29 DIAGNOSIS — E782 Mixed hyperlipidemia: Secondary | ICD-10-CM

## 2015-06-29 DIAGNOSIS — I2510 Atherosclerotic heart disease of native coronary artery without angina pectoris: Secondary | ICD-10-CM

## 2015-06-29 LAB — COMPREHENSIVE METABOLIC PANEL
ALBUMIN: 4.2 g/dL (ref 3.5–5.2)
ALT: 15 U/L (ref 0–53)
AST: 22 U/L (ref 0–37)
Alkaline Phosphatase: 38 U/L — ABNORMAL LOW (ref 39–117)
BUN: 33 mg/dL — AB (ref 6–23)
CO2: 30 mEq/L (ref 19–32)
CREATININE: 1.43 mg/dL (ref 0.40–1.50)
Calcium: 10.5 mg/dL (ref 8.4–10.5)
Chloride: 105 mEq/L (ref 96–112)
GFR: 50.91 mL/min — ABNORMAL LOW (ref >60.00)
Glucose, Bld: 107 mg/dL — ABNORMAL HIGH (ref 70–99)
Potassium: 3.5 mEq/L (ref 3.5–5.1)
SODIUM: 140 meq/L (ref 135–145)
TOTAL PROTEIN: 6.7 g/dL (ref 6.0–8.3)
Total Bilirubin: 0.6 mg/dL (ref 0.2–1.2)

## 2015-06-29 LAB — CBC WITH DIFFERENTIAL/PLATELET
Basophils Absolute: 0 10*3/uL (ref 0.0–0.1)
Basophils Relative: 0.8 % (ref 0.0–3.0)
EOS ABS: 0.1 10*3/uL (ref 0.0–0.7)
EOS PCT: 2.9 % (ref 0.0–5.0)
HCT: 38.4 % — ABNORMAL LOW (ref 39.0–52.0)
Hemoglobin: 12.9 g/dL — ABNORMAL LOW (ref 13.0–17.0)
LYMPHS ABS: 1.7 10*3/uL (ref 0.7–4.0)
Lymphocytes Relative: 35.8 % (ref 12.0–46.0)
MCHC: 33.7 g/dL (ref 30.0–36.0)
MCV: 86.2 fl (ref 78.0–100.0)
Monocytes Absolute: 0.4 10*3/uL (ref 0.1–1.0)
Monocytes Relative: 8.2 % (ref 3.0–12.0)
Neutro Abs: 2.5 10*3/uL (ref 1.4–7.7)
Neutrophils Relative %: 52.3 % (ref 43.0–77.0)
Platelets: 271 10*3/uL (ref 150.0–400.0)
RBC: 4.46 Mil/uL (ref 4.22–5.81)
RDW: 14.3 % (ref 11.5–15.5)
WBC: 4.7 10*3/uL (ref 4.0–10.5)

## 2015-06-29 LAB — HEMOGLOBIN A1C: Hgb A1c MFr Bld: 5.9 % (ref 4.6–6.5)

## 2015-06-29 LAB — URIC ACID: URIC ACID, SERUM: 7.1 mg/dL (ref 4.0–7.8)

## 2015-06-29 NOTE — Assessment & Plan Note (Signed)
Check uric acid level.  Continue allopurinol 100 mg daily. 

## 2015-06-29 NOTE — Assessment & Plan Note (Signed)
Continue aspirin, statin Continue good blood pressure control

## 2015-06-29 NOTE — Progress Notes (Signed)
Subjective:    Patient ID: Juan [name], male    DOB: Jul 29, 1935, 80 y.o.   MRN: [id number]  HPI He is here to establish with a new pcp.  He is here for followup.    Hypertension: He is taking his medication daily. He is compliant with a low sodium diet.  He denies chest pain, palpitations, edema, shortness of breath and regular headaches. He is exercising regularly - 3 days / week.  He does not monitor his blood pressure at home.    Hyperlipidemia: He is taking his medication daily (pravastatin and fenofibric acid). He is compliant with a low fat/cholesterol diet. He is exercising regularly. He denies myalgias.   History of TIA: He is taking aspirin daily. He also takes the pravastatin daily as prescribed.  CAD:  He denies chest pain, palpitations and lower extremity edema. He does not have any shortness of breath with exertion.  History of gout:  He takes allopurinol daily.    He follows with urology, cardiology and is doing PT twice a week for his broken right arm.    Medications and allergies reviewed with patient and updated if appropriate.  Patient Active Problem List   Diagnosis Date Noted  . Fracture, humerus, proximal 12/30/2014  . Dysplastic nevus 09/05/2014  . History of pancreatitis 06/25/2013  . CKD (chronic kidney disease) 06/24/2013  . Hyperglycemia 06/20/2013  . Syncope 06/19/2013  . TIA (transient ischemic attack) 02/07/2012  . RENAL INSUFFICIENCY 08/17/2010  . GERD 04/03/2010  . FLATULENCE-GAS-BLOATING 04/03/2010  . Essential hypertension, benign 03/02/2009  . DUODENITIS 03/01/2009  . DIVERTICULAR DISEASE 03/01/2009  . PERSONAL HX COLONIC POLYPS 03/19/2008  . NONSPECIFIC ABNORMAL FIND RAD&OTH EXAM GU ORGN 02/26/2008  . History of gout 12/09/2007  . Coronary atherosclerosis 12/09/2007  . Mixed hyperlipidemia 06/04/2007    Current Outpatient Prescriptions on File Prior to Visit  Medication Sig Dispense Refill  . allopurinol (ZYLOPRIM) 100 MG tablet  Take 1 tablet (100 mg total) by mouth daily. --- Please establish with new PCP for further refills 90 tablet 0  . aspirin EC 81 MG tablet Take 81 mg by mouth daily.    . Calcium Carb-Cholecalciferol (CALCIUM 600 + D PO) Take 600 mg by mouth. 3 tabs qpm    . Choline Fenofibrate (FENOFIBRIC ACID) 135 MG CPDR Take 1 capsule by mouth daily. 30 capsule 5  . finasteride (PROSCAR) 5 MG tablet Take 5 mg by mouth daily.    . metoprolol (LOPRESSOR) 50 MG tablet 1/2 bid 60 tablet 11  . pravastatin (PRAVACHOL) 20 MG tablet Take 20 mg by mouth every other day. Pt takes Juan [name], Friday 30 tablet 11   Current Facility-Administered Medications on File Prior to Visit  Medication Dose Route Frequency Provider Last Rate Last Dose  . 0.9 %  sodium chloride infusion   Intravenous Continuous Brad Dixon, PA-C      . chlorhexidine (HIBICLENS) 4 % liquid 4 application  60 mL Topical Once Merla Riches, PA-C        Past Medical History  Diagnosis Date  . Hyperlipidemia   . Duodenitis   . Gout   . Diverticular disease   . Elevated PSA   . Tubular adenoma polyp of rectum   . Pancreatitis     a. ? Simvastatin related  . GERD (gastroesophageal reflux disease)   . Hydronephrosis with ureteropelvic junction obstruction   . Hypertension   . TIA (transient ischemic attack)     a. 01/2012 -  presentation as acute imbalance;  b. 01/2012 carotid U/S w/o signif stenosis;  c. 01/2012 Echo: EF 60-65%, Gr 1 DD.;  d.  Carotid US (06/20/2013): 1-39% bilateral ICA stenosis  . Hypercalcemia 02/02/12    a. 01/2012: 10.7.  Marland Kitchen Syncope     a. 06/2013  . Pneumonia     "I think only once" (06/19/2013)  . Type II diabetes mellitus (Juan [name])     "diet and exercise controlled at present" (06/19/2013)  . Coronary artery disease     a. s/p CABG 2000; b. myoview 9/08: inf-septal scar with mild peri-infarct ischemia (low risk);  c.  Echo 8/13: mild focal basal septal hypertrophy, Gr 1 DD, mild LAE;  d. Lexiscan Myoview (06/20/2013): No reversible ischemia,  inferior and septal infarct, inferior and septal hypokinesis, EF 60%;  e.  Echo (06/20/2011): EF 50-55%, normal wall motion, grade 1 diastolic dysfunction, mild LAE  . CKD (chronic kidney disease) 06/24/2013  . Humerus fracture     right  . Cancer [facility])     skin    Past Surgical History  Procedure Laterality Date  . Cochlear implant Right 1990's  . Appendectomy    . Cholecystectomy    . Colonoscopy w/ polypectomy      no polyp 2012  . Parathyroidectomy  1978  . Polypectomy    . Cardiac catheterization  2000  . Coronary artery bypass graft  2000    CABG X5  . Tonsillectomy  1943  . Ankle surgery Right ~ 1943    "don't know what for"  . Hernia repair    . Reverse shoulder arthroplasty Right 12/30/2014    Procedure: Right shoulder ORIF, Biomet S3 Plate and ZImmer cable system ;  Surgeon: Netta Cedars, MD;  Location: [facility];  Service: Orthopedics;  Laterality: Right;    Social History   Social History  . Marital Status: Widowed    Spouse Name: N/A  . Number of Children: N/A  . Years of Education: N/A   Occupational History  . retired    Social History Main Topics  . Smoking status: Current Some Day Smoker -- 0 years    Types: Cigars  . Smokeless tobacco: Never Used     Comment: 06/19/2013 "rare cigar"  . Alcohol Use: Yes     Comment: 12/29/14 "might have a drink once/month"  . Drug Use: No  . Sexual Activity: Not Currently   Other Topics Concern  . None   Social History Narrative   Does get regular exercise    Family History  Problem Relation Age of Onset  . Melanoma Father   . Heart attack Father      in 26s  . Diabetes Neg Hx     Review of Systems  Constitutional: Negative for fever and chills.  Respiratory: Negative for cough, shortness of breath and wheezing.   Cardiovascular: Negative for chest pain, palpitations and leg swelling.  Gastrointestinal: Negative for nausea and abdominal pain.       No GERD  Neurological: Negative for dizziness,  light-headedness and headaches.       Objective:   Filed Vitals:   06/29/15 1336  BP: 134/74  Pulse: 78  Temp: 97.5 F (36.4 C)  Resp: 16   Filed Weights   06/29/15 1336  Weight: 170 lb (77.111 kg)   Body mass index is 23.72 kg/(m^2).   Physical Exam Constitutional: Appears well-developed and well-nourished. No distress.  Neck: Neck supple. No tracheal deviation present. No thyromegaly present.  No  carotid bruit. No cervical adenopathy.   Cardiovascular: Normal rate, regular rhythm and normal heart sounds.   No murmur heard.  No edema Pulmonary/Chest: Effort normal and breath sounds normal. No respiratory distress. No wheezes.         Assessment & Plan:   See problem list for assessment and plan  Shingles vaccine today  Blood work ordered

## 2015-06-29 NOTE — Progress Notes (Signed)
Pre visit review using our clinic review tool, if applicable. No additional management support is needed unless otherwise documented below in the visit note. 

## 2015-06-29 NOTE — Assessment & Plan Note (Signed)
Blood pressure well-controlled here today Continue current dose of metoprolol

## 2015-06-29 NOTE — Assessment & Plan Note (Signed)
Currently asymptomatic Continue aspirin, statin and metoprolol Continue regular exercise

## 2015-06-29 NOTE — Assessment & Plan Note (Signed)
Taking pravastatin daily Last lipid panel controlled Check CMP

## 2015-06-29 NOTE — Patient Instructions (Addendum)
  We have reviewed your prior records including labs and tests today.  Test(s) ordered today. Your results will be released to [place] (or called to you) after review, usually within 72hours after test completion. If any changes need to be made, you will be notified at that same time.  All other Health Maintenance issues reviewed.   All recommended immunizations and age-appropriate screenings are up-to-date.  Shingles vaccine administered today.   Medications reviewed and updated.  No changes recommended at this time.   Please schedule followup in 6 months

## 2015-06-29 NOTE — Progress Notes (Signed)
Patient ID: Juan [name], male   DOB: Oct 03, 1935, 80 y.o.   MRN: [id number]   Juan [name] is a 80 y.o.  male with a hx of CAD, s/p CABG in 2000, HTN, HL, deafness s/p cochlear implant. Last myoview 9/08: inf-septal scar with mild peri-infarct ischemia (low risk). He was seen in the ED with acute onset of imbalance in 01/2012. Seen by neurology. Head CT (MRI not done b/c of cochlear implant) was neg. Carotid U/S: no ICA stenosis. Echo 8/13: mild focal basal septal hypertrophy, Gr 1 DD, mild LAE. Saw neurology in f/u and episode was felt to be either dehydration or a TIA. ASA was continued.  Patient was recently admitted to the hospital 1/2-1/3 with syncope. His syncopal episode occurred while lifting weights. Syncopal episode was accompanied by nausea and vomiting. Head CT demonstrated no acute findings. Carotid US (06/20/2013): 1-39% bilateral ICA stenosis. Echo (06/20/2011): EF 50-55%, normal wall motion, grade 1 diastolic dysfunction, mild LAE. He was evaluated by [place]  Symptoms sound more vasovagal than cardiac in nature. Lexiscan Myoview (06/20/2013): No reversible ischemia, inferior and septal infarct, inferior and septal hypokinesis, EF 60%. Outpatient event monitor was recommended.   01/16/15 fell on stairs and had right displaced humeral fracture surgically repaired by Dr Juan [name] Still with poor ROM    ROS: Denies fever, malais, weight loss, blurry vision, decreased visual acuity, cough, sputum, SOB, hemoptysis, pleuritic pain, palpitaitons, heartburn, abdominal pain, melena, lower extremity edema, claudication, or rash.  All other systems reviewed and negative  General: Affect appropriate Healthy:  appears stated age 56: choclear implant  Neck supple with no adenopathy JVP normal no bruits no thyromegaly Lungs clear with no wheezing and good diaphragmatic motion Heart:  S1/S2 SEM  murmur, no rub, gallop or click PMI normal Abdomen: benighn, BS positve, no tenderness, no AAA no bruit.  No HSM or  HJR Distal pulses intact with no bruits No edema Neuro non-focal Skin warm and dry Frozen right shoulder post humeral fracture    Current Outpatient Prescriptions  Medication Sig Dispense Refill  . allopurinol (ZYLOPRIM) 100 MG tablet Take 1 tablet (100 mg total) by mouth daily. --- Please establish with new PCP for further refills 90 tablet 0  . aspirin EC 81 MG tablet Take 81 mg by mouth daily.    . Calcium Carb-Cholecalciferol (CALCIUM 600 + D PO) Take 600 mg by mouth daily.     . Choline Fenofibrate (FENOFIBRIC ACID) 135 MG CPDR Take 1 capsule by mouth daily. 30 capsule 5  . finasteride (PROSCAR) 5 MG tablet Take 5 mg by mouth daily.    . metoprolol (LOPRESSOR) 50 MG tablet Take 25 mg by mouth 2 (two) times daily.  60 tablet 11  . pravastatin (PRAVACHOL) 20 MG tablet Take 20 mg by mouth every other day. Pt takes Juan [name], Friday 30 tablet 11   No current facility-administered medications for this visit.   Facility-Administered Medications Ordered in Other Visits  Medication Dose Route Frequency Provider Last Rate Last Dose  . 0.9 %  sodium chloride infusion   Intravenous Continuous Juan Dixon, PA-C      . chlorhexidine (HIBICLENS) 4 % liquid 4 application  60 mL Topical Once Kellogg, PA-C        Allergies  Simvastatin  Electrocardiogram:  SR rate 61 nonspecific St T wave changes 08/25/13  SR rate 68 low voltage nonspecific ST changes no difference 06/29/14  Assessment and Plan  CAD/CABG:  Distant 2000 no angina continue  medical Rx Myovue 1/15 with old IMI no ischemia EF normal  Syncope:  Non cardiac carotids plaque no stenosis  Chol:   Cholesterol is at goal.  Continue current dose of statin and diet Rx.  No myalgias or side effects.  F/U  LFT's in 6 months. Lab Results  Component Value Date   LDLCALC 90 02/04/2014            Prostate:  F/U PSA primary continue Proscar Ortho:  Needs more PT for frozen left shoulder post surgery/trauma f/u Dr Juan [name]

## 2015-06-29 NOTE — Assessment & Plan Note (Signed)
Highest A1c on record is 6.4% Ideally monitor A1c twice a year Recheck A1c Continue regular exercise Low sugar/carbohydrate diet Currently managed by lifestyle

## 2015-06-30 ENCOUNTER — Encounter: Payer: Self-pay | Admitting: Cardiovascular Disease

## 2015-06-30 ENCOUNTER — Ambulatory Visit (INDEPENDENT_AMBULATORY_CARE_PROVIDER_SITE_OTHER): Payer: Federal, State, Local not specified - PPO | Admitting: Cardiovascular Disease

## 2015-06-30 VITALS — BP 160/70 | HR 72 | Ht 71.0 in | Wt 168.4 lb

## 2015-06-30 DIAGNOSIS — I10 Essential (primary) hypertension: Secondary | ICD-10-CM

## 2015-06-30 DIAGNOSIS — E782 Mixed hyperlipidemia: Secondary | ICD-10-CM | POA: Diagnosis not present

## 2015-06-30 NOTE — Patient Instructions (Signed)

## 2015-07-04 ENCOUNTER — Encounter: Payer: Self-pay | Admitting: Emergency Medicine

## 2015-07-28 ENCOUNTER — Other Ambulatory Visit: Payer: Self-pay | Admitting: Cardiovascular Disease

## 2015-07-28 NOTE — Telephone Encounter (Signed)
It wasn't actually sent to the pharmacy, that was when it was put on the chart for that dose by Dr Linna Darner.

## 2015-07-28 NOTE — Telephone Encounter (Signed)
Looks like it does not need to be refilled. Patient has recent new prescription for Metoprolol 25 mg (1/2 tablet) by mouth twice daily on 02/2015 with a year refill.

## 2015-07-28 NOTE — Telephone Encounter (Signed)
Send to Dr. Linna Darner

## 2015-07-28 NOTE — Telephone Encounter (Signed)
Looks like dose was reduced to 25mg  bid by Dr Linna Darner. Ok to refill or should this be deferred to Dr Linna Darner?

## 2015-08-01 ENCOUNTER — Telehealth: Payer: Self-pay

## 2015-08-01 MED ORDER — FENOFIBRIC ACID 135 MG PO CPDR: 1.0000 | DELAYED_RELEASE_CAPSULE | Freq: Every day | ORAL | Status: DC

## 2015-08-01 NOTE — Telephone Encounter (Signed)
error 

## 2015-08-01 NOTE — Telephone Encounter (Signed)
Patient came into office, requesting refill on 50mg  metoprolol, according to when it was ordered on sept/2016, he got 11 refills with that rx refill, however patient states he is out of medication, patient states he has not lost any medications, and he was told by pharmacy that they cannot refill med, no refills remaining---patient also states he was told by dr hopper that he was supposed to 1/2 the 50mg  pill and take x2 daily because 50mg  was making him dizzy---i cannot find in any office notes that patient was told to do that----i have scheduled appt for patient to see dr Jenny Reichmann on 2/14 at 5:15 to go over medications, especially metoprolol to figure out what is correct dosage for patient----routing to dr Jenny Reichmann, Juluis Rainier.Marland KitchenMarland Kitchen

## 2015-08-02 ENCOUNTER — Ambulatory Visit (INDEPENDENT_AMBULATORY_CARE_PROVIDER_SITE_OTHER): Payer: Federal, State, Local not specified - PPO | Admitting: Internal Medicine

## 2015-08-02 ENCOUNTER — Encounter: Payer: Self-pay | Admitting: Internal Medicine

## 2015-08-02 VITALS — BP 138/60 | HR 59 | Temp 97.5°F | Ht 71.0 in | Wt 171.2 lb

## 2015-08-02 DIAGNOSIS — I10 Essential (primary) hypertension: Secondary | ICD-10-CM | POA: Diagnosis not present

## 2015-08-02 MED ORDER — METOPROLOL TARTRATE 50 MG PO TABS: 25.0000 mg | ORAL_TABLET | Freq: Two times a day (BID) | ORAL | Status: DC

## 2015-08-02 NOTE — Patient Instructions (Signed)
OK to continue the metoprolol 50 mg at HALF pill twice per day  Please continue all other medications as before, and refills have been done if requested.  Please have the pharmacy call with any other refills you may need.  Please continue your efforts at being more active, low cholesterol diet, and weight control.  Please keep your appointments with your specialists as you may have planned

## 2015-08-02 NOTE — Assessment & Plan Note (Signed)
stable overall by history and exam, recent data reviewed with pt, and pt to continue medical treatment as before,  to f/u any worsening symptoms or concerns, med list corrected and new rx done erx

## 2015-08-02 NOTE — Progress Notes (Signed)
Pre visit review using our clinic review tool, if applicable. No additional management support is needed unless otherwise documented below in the visit note. 

## 2015-08-02 NOTE — Progress Notes (Signed)
Subjective:    Patient ID: Juan [name], male    DOB: 21-Feb-1936, 80 y.o.   MRN: [id number]  HPI  Here to f/u; overall doing ok,  Pt denies chest pain, increasing sob or doe, wheezing, orthopnea, PND, increased LE swelling, palpitations, dizziness or syncope.  Pt denies new neurological symptoms such as new headache, or facial or extremity weakness or numbness.  Pt denies polydipsia, polyuria, or low sugar episode.   Pt denies new neurological symptoms such as new headache, or facial or extremity weakness or numbness.   Pt states overall good compliance with meds, mostly trying to follow appropriate diet, with wt overall stable,  Pt unsure about the dosing of his metoprolol as he was tx per Card with 50 bid, but reduced per Dr Linna Darner to 25 bid (but med list not changed so therefore confusion about correct dosing).  Currently taking 25 bid. Past Medical History  Diagnosis Date  . Hyperlipidemia   . Duodenitis   . Gout   . Diverticular disease   . Elevated PSA   . Tubular adenoma polyp of rectum   . Pancreatitis     a. ? Simvastatin related  . GERD (gastroesophageal reflux disease)   . Hydronephrosis with ureteropelvic junction obstruction   . Hypertension   . TIA (transient ischemic attack)     a. 01/2012 - presentation as acute imbalance;  b. 01/2012 carotid U/S w/o signif stenosis;  c. 01/2012 Echo: EF 60-65%, Gr 1 DD.;  d.  Carotid US (06/20/2013): 1-39% bilateral ICA stenosis  . Hypercalcemia 02/02/12    a. 01/2012: 10.7.  Marland Kitchen Syncope     a. 06/2013  . Pneumonia     "I think only once" (06/19/2013)  . Type II diabetes mellitus ([place])     "diet and exercise controlled at present" (06/19/2013)  . Coronary artery disease     a. s/p CABG 2000; b. myoview 9/08: inf-septal scar with mild peri-infarct ischemia (low risk);  c.  Echo 8/13: mild focal basal septal hypertrophy, Gr 1 DD, mild LAE;  d. Lexiscan Myoview (06/20/2013): No reversible ischemia, inferior and septal infarct, inferior and septal  hypokinesis, EF 60%;  e.  Echo (06/20/2011): EF 50-55%, normal wall motion, grade 1 diastolic dysfunction, mild LAE  . CKD (chronic kidney disease) 06/24/2013  . Humerus fracture     right  . Cancer [facility])     skin   Past Surgical History  Procedure Laterality Date  . Cochlear implant Right 1990's  . Appendectomy    . Cholecystectomy    . Colonoscopy w/ polypectomy      no polyp 2012  . Parathyroidectomy  1978  . Polypectomy    . Cardiac catheterization  2000  . Coronary artery bypass graft  2000    CABG X5  . Tonsillectomy  1943  . Ankle surgery Right ~ 1943    "don't know what for"  . Hernia repair    . Reverse shoulder arthroplasty Right 12/30/2014    Procedure: Right shoulder ORIF, Biomet S3 Plate and ZImmer cable system ;  Surgeon: Netta Cedars, MD;  Location: [facility];  Service: Orthopedics;  Laterality: Right;    reports that he has been smoking Cigars.  He has never used smokeless tobacco. He reports that he drinks alcohol. He reports that he does not use illicit drugs. family history includes Heart attack in his father; Melanoma in his father. There is no history of Diabetes. Allergies  Allergen Reactions  . Simvastatin  REACTION:  Possible statin induced pancreatitis   Current Outpatient Prescriptions on File Prior to Visit  Medication Sig Dispense Refill  . allopurinol (ZYLOPRIM) 100 MG tablet Take 1 tablet (100 mg total) by mouth daily. --- Please establish with new PCP for further refills 90 tablet 0  . aspirin EC 81 MG tablet Take 81 mg by mouth daily.    . Choline Fenofibrate (FENOFIBRIC ACID) 135 MG CPDR Take 1 capsule by mouth daily. 30 capsule 5  . finasteride (PROSCAR) 5 MG tablet Take 5 mg by mouth daily.    . pravastatin (PRAVACHOL) 20 MG tablet Take 20 mg by mouth every other day. Pt takes [name], Friday 30 tablet 11   Current Facility-Administered Medications on File Prior to Visit  Medication Dose Route Frequency Provider Last Rate Last Dose  . 0.9 %   sodium chloride infusion   Intravenous Continuous Brad Dixon, PA-C      . chlorhexidine (HIBICLENS) 4 % liquid 4 application  60 mL Topical Once Kellogg, PA-C       Review of Systems All otherwise neg per pt     Objective:   Physical Exam BP 138/60 mmHg  Pulse 59  Temp(Src) 97.5 F (36.4 C) (Oral)  Ht 5\' 11"  (1.803 m)  Wt 171 lb 4 oz (77.678 kg)  BMI 23.89 kg/m2  SpO2 97% VS noted,  Constitutional: Pt appears in no significant distress HENT: Head: NCAT.  Right Ear: External ear normal.  Left Ear: External ear normal.  Eyes: . Pupils are equal, round, and reactive to light. Conjunctivae and EOM are normal Neck: Normal range of motion. Neck supple.  Cardiovascular: Normal rate and regular rhythm.   Pulmonary/Chest: Effort normal and breath sounds without rales or wheezing.  Neurological: Pt is alert. Not confused , motor grossly intact Skin: Skin is warm. No rash, no LE edema Psychiatric: Pt behavior is normal. No agitation.      Assessment & Plan:

## 2015-08-11 ENCOUNTER — Other Ambulatory Visit: Payer: Self-pay | Admitting: Cardiovascular Disease

## 2015-08-28 ENCOUNTER — Encounter: Payer: Self-pay | Admitting: Internal Medicine

## 2015-08-28 DIAGNOSIS — N138 Other obstructive and reflux uropathy: Secondary | ICD-10-CM | POA: Insufficient documentation

## 2015-08-28 DIAGNOSIS — N5201 Erectile dysfunction due to arterial insufficiency: Secondary | ICD-10-CM | POA: Insufficient documentation

## 2015-08-28 DIAGNOSIS — N401 Enlarged prostate with lower urinary tract symptoms: Secondary | ICD-10-CM

## 2015-08-28 DIAGNOSIS — K4090 Unilateral inguinal hernia, without obstruction or gangrene, not specified as recurrent: Secondary | ICD-10-CM | POA: Insufficient documentation

## 2015-08-30 ENCOUNTER — Ambulatory Visit: Payer: Self-pay | Admitting: General Surgery

## 2015-08-30 NOTE — H&P (Signed)
Juan [name] 08/30/2015 2:23 PM Location: [facility] Patient #: [id number] DOB: 02/09/1936 Widowed / Language: Juan [name] / Race: White Male  History of Present Illness Juan Hollingshead MD; 08/30/2015 3:04 PM) The patient is a 80 year old male.   Note:He is referred by Dr. Diona [name] for consultation regarding a large right inguinal hernia. He noted some swelling in the right groin area about 2 months ago. He occasionally has some stinging pain in that region. He states he does not strain to urinate or have a bowel movement. He saw Dr. Diona [name] who diagnosed the hernia and sent him here for further evaluation and treatment. He is hard of hearing. His daughter is with him.  His PMH is notable for HTN, CAD s/p CABG, Hyperlipidemia, GERD, BPH (well controlled), pancreatitis, TIA  Other Problems Juan [name], CMA; 08/30/2015 2:23 PM) High blood pressure Hypercholesterolemia Pancreatitis  Past Surgical History Juan [name], CMA; 08/30/2015 2:23 PM) Gallbladder Surgery - Laparoscopic Thyroid Surgery Valve Replacement  Diagnostic Studies History Juan [name], CMA; 08/30/2015 2:23 PM) Colonoscopy 1-5 years ago  Allergies Juan [name], CMA; 08/30/2015 2:24 PM) Simvastatin *CHEMICALS*  Medication History Juan [name], CMA; 08/30/2015 2:25 PM) Allopurinol (100MG  Tablet, Oral) Active. Finasteride (5MG  Tablet, Oral) Active. Metoprolol Tartrate (50MG  Tablet, Oral) Active. Pravastatin Sodium (20MG  Tablet, Oral) Active. Fenofibric Acid (135MG  Capsule DR, Oral) Active. Aspirin (81MG  Tablet, Oral) Active. Medications Reconciled  Family History Juan [name], Juan [name]; 08/30/2015 2:23 PM) Hypertension Juan [name], Juan [name].     Review of Systems Juan [name] CMA; 08/30/2015 2:23 PM) General Not Present- Appetite Loss, Chills, Fatigue, Fever, Night Sweats, Weight Gain and Weight Loss. Skin Not Present- Change in Wart/Mole, Dryness, Hives, Jaundice, New Lesions, Non-Healing  Wounds, Rash and Ulcer. HEENT Present- Hearing Loss and Wears glasses/contact lenses. Not Present- Earache, Hoarseness, Nose Bleed, Oral Ulcers, Ringing in the Ears, Seasonal Allergies, Sinus Pain, Sore Throat, Visual Disturbances and Yellow Eyes. Respiratory Not Present- Bloody sputum, Chronic Cough, Difficulty Breathing, Snoring and Wheezing. Breast Not Present- Breast Mass, Breast Pain, Nipple Discharge and Skin Changes. Cardiovascular Not Present- Chest Pain, Difficulty Breathing Lying Down, Leg Cramps, Palpitations, Rapid Heart Rate, Shortness of Breath and Swelling of Extremities. Male Genitourinary Not Present- Blood in Urine, Change in Urinary Stream, Frequency, Impotence, Nocturia, Painful Urination, Urgency and Urine Leakage. Musculoskeletal Not Present- Back Pain, Joint Pain, Joint Stiffness, Muscle Pain, Muscle Weakness and Swelling of Extremities. Neurological Not Present- Decreased Memory, Fainting, Headaches, Numbness, Seizures, Tingling, Tremor, Trouble walking and Weakness. Psychiatric Not Present- Anxiety, Bipolar, Change in Sleep Pattern, Depression, Fearful and Frequent crying. Hematology Not Present- Easy Bruising, Excessive bleeding, Gland problems, HIV and Persistent Infections.  Vitals Juan [name] CMA; 08/30/2015 2:25 PM) 08/30/2015 2:25 PM Weight: 177 lb Height: 71in Body Surface Area: 2 m Body Mass Index: 24.69 kg/m  Temp.: 98.60F(Temporal)  Pulse: 82 (Regular)  BP: 130/70 (Sitting, Left Arm, Standard)      Physical Exam Juan Hollingshead MD; 08/30/2015 3:02 PM)  The physical exam findings are as follows: Note:General: WDWN in NAD. Pleasant and cooperative.  HEENT: Juan [name]/AT, hearing aid in right ear  EYES: no icterus  CV: RRR, no murmur, no JVD.  CHEST: Breath sounds equal and clear. Respirations nonlabored.  ABDOMEN: Soft, nontender, nondistended, no umbilical bulge  GU: Large right inguinal bulge extending down into the scrotum but is  reducible in the supine position. Very small left inguinal bulge felt with a cough in the standing position.  SKIN: No jaundice.  NEUROLOGIC: Alert and oriented, answers questions  appropriately, normal gait and station.  PSYCHIATRIC: Normal mood, affect , and behavior.    Assessment & Plan Juan Hollingshead MD; 08/30/2015 2:57 PM)  RIGHT INGUINAL HERNIA (K40.90) Impression: This is a large hernia extending into the scrotum. It is reducible and the supine position. He has a tiny left inguinal hernia. I do not think needs to be addressed at this time. We discussed the rare problem of incarceration and what to do if that happened.  Plan: I recommended open right inguinal hernia repair with mesh. I suggested he get some hernia underwear to help with the discomfort until we get the surgery scheduled. I have explained the procedure, risks, and aftercare of inguinal hernia repair. Risks include but are not limited to bleeding, infection, wound problems, anesthesia, recurrence, bladder or intestine injury, urinary retention, testicular dysfunction and swelling, chronic pain, mesh problems. He and his daughter seem to understand and agree with the plan.  Jackolyn Confer, MD

## 2015-09-13 NOTE — Patient Instructions (Addendum)
DANG CORPUS  09/13/2015   Your procedure is scheduled on: 09-15-15  Report to [facility] Main  Entrance take [facility]  elevators to 3rd floor to  [facility] at 530 AM.  Call this number if you have problems the morning of surgery [phone]   Remember: ONLY 1 PERSON MAY GO WITH YOU TO SHORT STAY TO GET  READY MORNING OF [place].  Do not eat food or drink liquids :After Midnight.     Take these medicines the morning of surgery with A SIP OF WATER:  FINASTERIDE (PROSCAR), PRAVASTATIN (PRAVACHOL METOPROLOL             You may not have any metal on your body including hair pins and              piercings  Do not wear jewelry, make-up, lotions, powders or perfumes, deodorant             Do not wear nail polish.  Do not shave  48 hours prior to surgery.              Men may shave face and neck.   Do not bring valuables to the hospital. [place].  Contacts, dentures or bridgework may not be worn into surgery.  Leave suitcase in the car. After surgery it may be brought to your room.     _____________________________________________________________________             [facility] - Preparing for Surgery Before surgery, you can play an important role.  Because skin is not sterile, your skin needs to be as free of germs as possible.  You can reduce the number of germs on your skin by washing with CHG (chlorahexidine gluconate) soap before surgery.  CHG is an antiseptic cleaner which kills germs and bonds with the skin to continue killing germs even after washing. Please DO NOT use if you have an allergy to CHG or antibacterial soaps.  If your skin becomes reddened/irritated stop using the CHG and inform your nurse when you arrive at Short Stay. Do not shave (including legs and underarms) for at least 48 hours prior to the first CHG shower.  You may shave your face/neck. Please follow these instructions  carefully:  1.  Shower with CHG Soap the night before surgery and the  morning of Surgery.  2.  If you choose to wash your hair, wash your hair first as usual with your  normal  shampoo.  3.  After you shampoo, rinse your hair and body thoroughly to remove the  shampoo.                           4.  Use CHG as you would any other liquid soap.  You can apply chg directly  to the skin and wash                       Gently with a scrungie or clean washcloth.  5.  Apply the CHG Soap to your body ONLY FROM THE NECK DOWN.   Do not use on face/ open  Wound or open sores. Avoid contact with eyes, ears mouth and genitals (private parts).                       Wash face,  Genitals (private parts) with your normal soap.             6.  Wash thoroughly, paying special attention to the area where your surgery  will be performed.  7.  Thoroughly rinse your body with warm water from the neck down.  8.  DO NOT shower/wash with your normal soap after using and rinsing off  the CHG Soap.                9.  Pat yourself dry with a clean towel.            10.  Wear clean pajamas.            11.  Place clean sheets on your bed the night of your first shower and do not  sleep with pets. Day of Surgery : Do not apply any lotions/deodorants the morning of surgery.  Please wear clean clothes to the hospital/surgery center.  FAILURE TO FOLLOW THESE INSTRUCTIONS MAY RESULT IN THE CANCELLATION OF YOUR SURGERY PATIENT SIGNATURE_________________________________  NURSE SIGNATURE__________________________________  ________________________________________________________________________

## 2015-09-13 NOTE — Progress Notes (Signed)
LOV DR Johnsie Cancel CVARDIOLOGY 06-30-15 EPIC  EKG 12-30-14 EPIC

## 2015-09-14 ENCOUNTER — Encounter (HOSPITAL_COMMUNITY)
Admission: RE | Admit: 2015-09-14 | Discharge: 2015-09-14 | Disposition: A | Discharge status: Home / Self Care | Payer: Federal, State, Local not specified - PPO | Source: Ambulatory Visit | Attending: General Surgery | Admitting: General Surgery

## 2015-09-14 ENCOUNTER — Encounter (HOSPITAL_COMMUNITY): Payer: Self-pay

## 2015-09-14 DIAGNOSIS — E119 Type 2 diabetes mellitus without complications: Secondary | ICD-10-CM | POA: Insufficient documentation

## 2015-09-14 DIAGNOSIS — K4090 Unilateral inguinal hernia, without obstruction or gangrene, not specified as recurrent: Secondary | ICD-10-CM | POA: Insufficient documentation

## 2015-09-14 DIAGNOSIS — Z01812 Encounter for preprocedural laboratory examination: Secondary | ICD-10-CM | POA: Diagnosis present

## 2015-09-14 LAB — CBC WITH DIFFERENTIAL/PLATELET
BASOS ABS: 0 10*3/uL (ref 0.0–0.1)
Basophils Relative: 1 %
EOS ABS: 0.2 10*3/uL (ref 0.0–0.7)
EOS PCT: 4 %
HCT: 37.1 % — ABNORMAL LOW (ref 39.0–52.0)
HEMOGLOBIN: 12.8 g/dL — AB (ref 13.0–17.0)
LYMPHS ABS: 1.7 10*3/uL (ref 0.7–4.0)
LYMPHS PCT: 36 %
MCH: 30 pg (ref 26.0–34.0)
MCHC: 34.5 g/dL (ref 30.0–36.0)
MCV: 87.1 fL (ref 78.0–100.0)
Monocytes Absolute: 0.4 10*3/uL (ref 0.1–1.0)
Monocytes Relative: 8 %
NEUTROS PCT: 51 %
Neutro Abs: 2.5 10*3/uL (ref 1.7–7.7)
PLATELETS: 225 10*3/uL (ref 150–400)
RBC: 4.26 MIL/uL (ref 4.22–5.81)
RDW: 13.8 % (ref 11.5–15.5)
WBC: 4.9 10*3/uL (ref 4.0–10.5)

## 2015-09-14 LAB — PROTIME-INR
INR: 1.15 (ref 0.00–1.49)
Prothrombin Time: 14.9 seconds (ref 11.6–15.2)

## 2015-09-14 LAB — COMPREHENSIVE METABOLIC PANEL
ALK PHOS: 35 U/L — AB (ref 38–126)
ALT: 17 U/L (ref 17–63)
AST: 26 U/L (ref 15–41)
Albumin: 4.1 g/dL (ref 3.5–5.0)
Anion gap: 8 (ref 5–15)
BUN: 34 mg/dL — AB (ref 6–20)
CALCIUM: 10.2 mg/dL (ref 8.9–10.3)
CHLORIDE: 108 mmol/L (ref 101–111)
CO2: 25 mmol/L (ref 22–32)
CREATININE: 1.35 mg/dL — AB (ref 0.61–1.24)
GFR calc Af Amer: 57 mL/min — ABNORMAL LOW (ref >60)
GFR calc non Af Amer: 49 mL/min — ABNORMAL LOW (ref >60)
GLUCOSE: 141 mg/dL — AB (ref 65–99)
Potassium: 3.7 mmol/L (ref 3.5–5.1)
SODIUM: 141 mmol/L (ref 135–145)
Total Bilirubin: 0.7 mg/dL (ref 0.3–1.2)
Total Protein: 6.8 g/dL (ref 6.5–8.1)

## 2015-09-15 ENCOUNTER — Encounter (HOSPITAL_COMMUNITY)
Admission: RE | Disposition: A | Discharge status: Home / Self Care | Payer: Self-pay | Source: Ambulatory Visit | Attending: General Surgery

## 2015-09-15 ENCOUNTER — Inpatient Hospital Stay (HOSPITAL_COMMUNITY): Payer: Federal, State, Local not specified - PPO | Admitting: Certified Registered"

## 2015-09-15 ENCOUNTER — Encounter (HOSPITAL_COMMUNITY): Payer: Self-pay | Admitting: *Deleted

## 2015-09-15 ENCOUNTER — Ambulatory Visit (HOSPITAL_COMMUNITY)
Admission: RE | Admit: 2015-09-15 | Discharge: 2015-09-16 | Disposition: A | Discharge status: Home / Self Care | Payer: Federal, State, Local not specified - PPO | Source: Ambulatory Visit | Attending: General Surgery | Admitting: General Surgery

## 2015-09-15 DIAGNOSIS — N400 Enlarged prostate without lower urinary tract symptoms: Secondary | ICD-10-CM | POA: Insufficient documentation

## 2015-09-15 DIAGNOSIS — I2510 Atherosclerotic heart disease of native coronary artery without angina pectoris: Secondary | ICD-10-CM | POA: Diagnosis not present

## 2015-09-15 DIAGNOSIS — F17210 Nicotine dependence, cigarettes, uncomplicated: Secondary | ICD-10-CM | POA: Diagnosis not present

## 2015-09-15 DIAGNOSIS — Z952 Presence of prosthetic heart valve: Secondary | ICD-10-CM | POA: Diagnosis not present

## 2015-09-15 DIAGNOSIS — E7800 Pure hypercholesterolemia, unspecified: Secondary | ICD-10-CM | POA: Insufficient documentation

## 2015-09-15 DIAGNOSIS — I10 Essential (primary) hypertension: Secondary | ICD-10-CM | POA: Diagnosis not present

## 2015-09-15 DIAGNOSIS — Z8673 Personal history of transient ischemic attack (TIA), and cerebral infarction without residual deficits: Secondary | ICD-10-CM | POA: Diagnosis not present

## 2015-09-15 DIAGNOSIS — K219 Gastro-esophageal reflux disease without esophagitis: Secondary | ICD-10-CM | POA: Insufficient documentation

## 2015-09-15 DIAGNOSIS — K4090 Unilateral inguinal hernia, without obstruction or gangrene, not specified as recurrent: Principal | ICD-10-CM | POA: Insufficient documentation

## 2015-09-15 DIAGNOSIS — Z7982 Long term (current) use of aspirin: Secondary | ICD-10-CM | POA: Insufficient documentation

## 2015-09-15 DIAGNOSIS — E119 Type 2 diabetes mellitus without complications: Secondary | ICD-10-CM | POA: Insufficient documentation

## 2015-09-15 DIAGNOSIS — Z951 Presence of aortocoronary bypass graft: Secondary | ICD-10-CM | POA: Diagnosis not present

## 2015-09-15 DIAGNOSIS — Z888 Allergy status to other drugs, medicaments and biological substances status: Secondary | ICD-10-CM | POA: Diagnosis not present

## 2015-09-15 HISTORY — PX: INSERTION OF MESH: SHX5868

## 2015-09-15 HISTORY — PX: INGUINAL HERNIA REPAIR: SHX194

## 2015-09-15 LAB — HEMOGLOBIN A1C
Hgb A1c MFr Bld: 6 % — ABNORMAL HIGH (ref 4.8–5.6)
MEAN PLASMA GLUCOSE: 126 mg/dL

## 2015-09-15 LAB — GLUCOSE, CAPILLARY
GLUCOSE-CAPILLARY: 120 mg/dL — AB (ref 65–99)
Glucose-Capillary: 127 mg/dL — ABNORMAL HIGH (ref 65–99)

## 2015-09-15 SURGERY — REPAIR, HERNIA, INGUINAL, ADULT
Anesthesia: General | Site: Inguinal | Laterality: Right

## 2015-09-15 MED ORDER — FINASTERIDE 5 MG PO TABS
5.0000 mg | ORAL_TABLET | Freq: Every day | ORAL | Status: DC
  Administered 2015-09-15 – 2015-09-16 (×2): 5 mg via ORAL
  Filled 2015-09-15 (×2): qty 1

## 2015-09-15 MED ORDER — ONDANSETRON HCL 4 MG/2ML IJ SOLN
INTRAMUSCULAR | Status: AC
  Filled 2015-09-15: qty 2

## 2015-09-15 MED ORDER — HYDROMORPHONE HCL 1 MG/ML IJ SOLN: 0.2500 mg | INTRAMUSCULAR | Status: DC | PRN

## 2015-09-15 MED ORDER — ONDANSETRON HCL 4 MG/2ML IJ SOLN: 4.0000 mg | Freq: Four times a day (QID) | INTRAMUSCULAR | Status: DC | PRN

## 2015-09-15 MED ORDER — EPHEDRINE SULFATE 50 MG/ML IJ SOLN
INTRAMUSCULAR | Status: DC | PRN
  Administered 2015-09-15: 2.5 mg via INTRAVENOUS
  Administered 2015-09-15 (×2): 5 mg via INTRAVENOUS

## 2015-09-15 MED ORDER — METOPROLOL TARTRATE 25 MG PO TABS
25.0000 mg | ORAL_TABLET | Freq: Two times a day (BID) | ORAL | Status: DC
  Administered 2015-09-15 – 2015-09-16 (×2): 25 mg via ORAL
  Filled 2015-09-15 (×3): qty 1

## 2015-09-15 MED ORDER — ALLOPURINOL 100 MG PO TABS
100.0000 mg | ORAL_TABLET | Freq: Every evening | ORAL | Status: DC
  Administered 2015-09-15: 100 mg via ORAL
  Filled 2015-09-15 (×2): qty 1

## 2015-09-15 MED ORDER — MIDAZOLAM HCL 2 MG/2ML IJ SOLN
INTRAMUSCULAR | Status: AC
  Filled 2015-09-15: qty 2

## 2015-09-15 MED ORDER — MEPERIDINE HCL 50 MG/ML IJ SOLN: 6.2500 mg | INTRAMUSCULAR | Status: DC | PRN

## 2015-09-15 MED ORDER — HEPARIN SODIUM (PORCINE) 5000 UNIT/ML IJ SOLN
5000.0000 [IU] | Freq: Three times a day (TID) | INTRAMUSCULAR | Status: DC
  Filled 2015-09-15 (×4): qty 1

## 2015-09-15 MED ORDER — MIDAZOLAM HCL 2 MG/2ML IJ SOLN
INTRAMUSCULAR | Status: DC | PRN
  Administered 2015-09-15 (×4): .5 mg via INTRAVENOUS

## 2015-09-15 MED ORDER — NEOSTIGMINE METHYLSULFATE 10 MG/10ML IV SOLN
INTRAVENOUS | Status: AC
Start: 2015-09-15 — End: 2015-09-15
  Filled 2015-09-15: qty 1

## 2015-09-15 MED ORDER — GLYCOPYRROLATE 0.2 MG/ML IJ SOLN
INTRAMUSCULAR | Status: AC
  Filled 2015-09-15: qty 3

## 2015-09-15 MED ORDER — MIDAZOLAM HCL 2 MG/2ML IJ SOLN: 0.5000 mg | Freq: Once | INTRAMUSCULAR | Status: DC | PRN

## 2015-09-15 MED ORDER — MORPHINE SULFATE (PF) 2 MG/ML IV SOLN
2.0000 mg | INTRAVENOUS | Status: DC | PRN
  Administered 2015-09-15 – 2015-09-16 (×4): 2 mg via INTRAVENOUS
  Filled 2015-09-15 (×4): qty 1

## 2015-09-15 MED ORDER — LACTATED RINGERS IV SOLN
INTRAVENOUS | Status: DC | PRN
  Administered 2015-09-15 (×2): via INTRAVENOUS

## 2015-09-15 MED ORDER — LIDOCAINE HCL (CARDIAC) 20 MG/ML IV SOLN
INTRAVENOUS | Status: DC | PRN
  Administered 2015-09-15: 20 mg via INTRATRACHEAL
  Administered 2015-09-15: 40 mg via INTRATRACHEAL

## 2015-09-15 MED ORDER — PROPOFOL 10 MG/ML IV BOLUS
INTRAVENOUS | Status: DC | PRN
  Administered 2015-09-15: 100 mg via INTRAVENOUS

## 2015-09-15 MED ORDER — HYDROCODONE-ACETAMINOPHEN 5-325 MG PO TABS
1.0000 | ORAL_TABLET | ORAL | Status: DC | PRN
  Administered 2015-09-15 – 2015-09-16 (×3): 2 via ORAL
  Filled 2015-09-15 (×3): qty 2

## 2015-09-15 MED ORDER — BUPIVACAINE-EPINEPHRINE 0.5% -1:200000 IJ SOLN
INTRAMUSCULAR | Status: DC | PRN
  Administered 2015-09-15: 16 mL

## 2015-09-15 MED ORDER — CEFAZOLIN SODIUM-DEXTROSE 2-3 GM-% IV SOLR
INTRAVENOUS | Status: AC
  Filled 2015-09-15: qty 50

## 2015-09-15 MED ORDER — FENTANYL CITRATE (PF) 250 MCG/5ML IJ SOLN
INTRAMUSCULAR | Status: AC
Start: 2015-09-15 — End: 2015-09-15
  Filled 2015-09-15: qty 5

## 2015-09-15 MED ORDER — ONDANSETRON HCL 4 MG/2ML IJ SOLN
INTRAMUSCULAR | Status: DC | PRN
Start: 2015-09-15 — End: 2015-09-15
  Administered 2015-09-15: 4 mg via INTRAVENOUS

## 2015-09-15 MED ORDER — CEFAZOLIN SODIUM-DEXTROSE 2-4 GM/100ML-% IV SOLN
2.0000 g | INTRAVENOUS | Status: AC
  Administered 2015-09-15: 2 g via INTRAVENOUS

## 2015-09-15 MED ORDER — LIDOCAINE HCL (CARDIAC) 20 MG/ML IV SOLN
INTRAVENOUS | Status: AC
  Filled 2015-09-15: qty 5

## 2015-09-15 MED ORDER — ONDANSETRON 4 MG PO TBDP: 4.0000 mg | ORAL_TABLET | Freq: Four times a day (QID) | ORAL | Status: DC | PRN

## 2015-09-15 MED ORDER — BUPIVACAINE-EPINEPHRINE (PF) 0.5% -1:200000 IJ SOLN
INTRAMUSCULAR | Status: AC
  Filled 2015-09-15: qty 30

## 2015-09-15 MED ORDER — BUPIVACAINE-EPINEPHRINE (PF) 0.5% -1:200000 IJ SOLN
INTRAMUSCULAR | Status: DC | PRN
  Administered 2015-09-15: 30 mL

## 2015-09-15 MED ORDER — FENTANYL CITRATE (PF) 250 MCG/5ML IJ SOLN
INTRAMUSCULAR | Status: DC | PRN
  Administered 2015-09-15 (×5): 25 ug via INTRAVENOUS

## 2015-09-15 MED ORDER — PROPOFOL 10 MG/ML IV BOLUS
INTRAVENOUS | Status: AC
  Filled 2015-09-15: qty 20

## 2015-09-15 SURGICAL SUPPLY — 34 items
BENZOIN TINCTURE PRP APPL 2/3 (GAUZE/BANDAGES/DRESSINGS) ×4 IMPLANT
BLADE SURG 15 STRL LF DISP TIS (BLADE) ×2 IMPLANT
BLADE SURG 15 STRL SS (BLADE) ×2
CHLORAPREP W/TINT 26ML (MISCELLANEOUS) ×4 IMPLANT
CLOSURE WOUND 1/2 X4 (GAUZE/BANDAGES/DRESSINGS) ×1
COVER SURGICAL LIGHT HANDLE (MISCELLANEOUS) ×4 IMPLANT
DECANTER SPIKE VIAL GLASS SM (MISCELLANEOUS) ×4 IMPLANT
DRAIN PENROSE 18X1/2 LTX STRL (DRAIN) ×4 IMPLANT
DRAPE INCISE IOBAN 66X45 STRL (DRAPES) ×4 IMPLANT
DRAPE LAPAROTOMY TRNSV 102X78 (DRAPE) ×4 IMPLANT
DRSG TEGADERM 4X4.75 (GAUZE/BANDAGES/DRESSINGS) ×4 IMPLANT
ELECT PENCIL ROCKER SW 15FT (MISCELLANEOUS) ×4 IMPLANT
ELECT REM PT RETURN 9FT ADLT (ELECTROSURGICAL) ×4
ELECTRODE REM PT RTRN 9FT ADLT (ELECTROSURGICAL) ×2 IMPLANT
GAUZE SPONGE 4X4 12PLY STRL (GAUZE/BANDAGES/DRESSINGS) IMPLANT
GLOVE ECLIPSE 8.0 STRL XLNG CF (GLOVE) ×4 IMPLANT
GLOVE INDICATOR 8.0 STRL GRN (GLOVE) ×4 IMPLANT
GOWN STRL REUS W/TWL XL LVL3 (GOWN DISPOSABLE) ×8 IMPLANT
KIT BASIN OR (CUSTOM PROCEDURE TRAY) ×4 IMPLANT
MESH HERNIA 3X6 (Mesh General) ×4 IMPLANT
NEEDLE HYPO 25X1 1.5 SAFETY (NEEDLE) ×4 IMPLANT
PACK BASIC VI WITH GOWN DISP (CUSTOM PROCEDURE TRAY) ×4 IMPLANT
SPONGE LAP 4X18 X RAY DECT (DISPOSABLE) ×4 IMPLANT
STRIP CLOSURE SKIN 1/2X4 (GAUZE/BANDAGES/DRESSINGS) ×3 IMPLANT
SUT MNCRL AB 4-0 PS2 18 (SUTURE) ×4 IMPLANT
SUT PROLENE 2 0 CT2 30 (SUTURE) ×8 IMPLANT
SUT VIC AB 2-0 SH 18 (SUTURE) ×4 IMPLANT
SUT VIC AB 3-0 SH 27 (SUTURE) ×2
SUT VIC AB 3-0 SH 27XBRD (SUTURE) ×2 IMPLANT
SYR BULB IRRIGATION 50ML (SYRINGE) ×4 IMPLANT
SYR CONTROL 10ML LL (SYRINGE) ×4 IMPLANT
TOWEL OR 17X26 10 PK STRL BLUE (TOWEL DISPOSABLE) ×4 IMPLANT
TOWEL OR NON WOVEN STRL DISP B (DISPOSABLE) ×4 IMPLANT
YANKAUER SUCT BULB TIP 10FT TU (MISCELLANEOUS) ×4 IMPLANT

## 2015-09-15 NOTE — Addendum Note (Signed)
Addendum  created 09/15/15 1144 by Lollie Sails, CRNA   Modules edited: Charges VN

## 2015-09-15 NOTE — Interval H&P Note (Signed)
History and Physical Interval Note:  09/15/2015 7:27 AM  Juan [name]  has presented today for surgery, with the diagnosis of right inguinal hernia  The various methods of treatment have been discussed with the patient and family. After consideration of risks, benefits and other options for treatment, the patient has consented to  Procedure(s): OPEN REPAIR RIGHT INGUINAL HERNIA  (Right) INSERTION OF MESH (Right) as a surgical intervention .  The patient's history has been reviewed, patient examined, no change in status, stable for surgery.  I have reviewed the patient's chart and labs.  Questions were answered to the patient's satisfaction.     Shaunette Gassner Lenna Sciara

## 2015-09-15 NOTE — Anesthesia Preprocedure Evaluation (Addendum)
Anesthesia Evaluation  Patient identified by MRN, date of birth, ID band Patient awake    Reviewed: Allergy & Precautions, NPO status , Patient's Chart, lab work & pertinent test results  History of Anesthesia Complications Negative for: history of anesthetic complications  Airway Mallampati: II  TM Distance: >3 FB Neck ROM: Full    Dental  (+) Dental Advisory Given   Pulmonary Current Smoker,    breath sounds clear to auscultation       Cardiovascular hypertension, Pt. on medications and Pt. on home beta blockers + CABG   Rhythm:Regular Rate:Normal  '15 ECHO:  EF 55-60%, valves OK Lexiscan Myoview (06/20/2013): No reversible ischemia, inferior and septal infarct, inferior and septal hypokinesis, EF 60%   Neuro/Psych TIA   GI/Hepatic negative GI ROS, Neg liver ROS,   Endo/Other  diabetes (diet controlled), Well Controlled  Renal/GU negative Renal ROS     Musculoskeletal   Abdominal   Peds  Hematology   Anesthesia Other Findings   Reproductive/Obstetrics                           Anesthesia Physical Anesthesia Plan  ASA: III  Anesthesia Plan: General   Post-op Pain Management: GA combined w/ Regional for post-op pain   Induction: Intravenous  Airway Management Planned: LMA  Additional Equipment:   Intra-op Plan:   Post-operative Plan:   Informed Consent: I have reviewed the patients History and Physical, chart, labs and discussed the procedure including the risks, benefits and alternatives for the proposed anesthesia with the patient or authorized representative who has indicated his/her understanding and acceptance.   Dental advisory given  Plan Discussed with: CRNA and Surgeon  Anesthesia Plan Comments: (Plan routine monitors, GA)        Anesthesia Quick Evaluation

## 2015-09-15 NOTE — Anesthesia Procedure Notes (Addendum)
Procedure Name: LMA Insertion Date/Time: 09/15/2015 7:59 AM Performed by: Cynda Familia Pre-anesthesia Checklist: Patient identified, Emergency Drugs available, Suction available and Patient being monitored Patient Re-evaluated:Patient Re-evaluated prior to inductionOxygen Delivery Method: Circle System Utilized Preoxygenation: Pre-oxygenation with 100% oxygen Intubation Type: IV induction Ventilation: Mask ventilation without difficulty LMA: LMA inserted LMA Size: 4.0 Tube type: Oral Number of attempts: 1 Placement Confirmation: positive ETCO2 and breath sounds checked- equal and bilateral Tube secured with: Tape Dental Injury: Teeth and Oropharynx as per pre-operative assessment  Comments: Smooth IV induction Glennon Mac--- LMA AM atraumatic- bilat BS Shaunika Italiano   Anesthesia Regional Block:  TAP block  Pre-Anesthetic Checklist: ,, timeout performed, Correct Patient, Correct Site, Correct Laterality, Correct Procedure, Correct Position, site marked, Risks and benefits discussed,  Surgical consent,  Pre-op evaluation,  At surgeon's request and post-op pain management  Laterality: Right  Prep: chloraprep       Needles:  Injection technique: Single-shot  Needle Type: Echogenic Needle     Needle Length: 10cm 10 cm Needle Gauge: 22 and 22 G    Additional Needles:  Procedures: ultrasound guided (picture in chart) TAP block Narrative:  Start time: 09/15/2015 7:18 AM End time: 09/15/2015 7:29 AM Injection made incrementally with aspirations every 5 mL.  Performed by: Personally  Anesthesiologist: Glennon Mac, Micharl Helmes  Additional Notes: Pt identified in Holding room.  Monitors applied. Working IV access confirmed. Sterile prep, drape R flank.  #22ga ECHOgenic needle into TAP with US guidance.  30cc 0.5% Bupivacaine with 1:200k epi injected incrementally after negative test dose, good spread of local anesthetic.  Patient asymptomatic, VSS, no heme aspirated, tolerated well.  Jenita Seashore, MD

## 2015-09-15 NOTE — Op Note (Signed)
OPERATIVE NOTE- INGUINAL HERNIA REPAIR  Preoperative diagnosis:  Right inguinal hernia.  Postoperative diagnosis:  Same (Indirect)  Procedure:  Right inguinal hernia repair with mesh.  Surgeon:  Jackolyn Confer, M.D.  Anesthesia:  General/LMA, TAP block, local (Marcaine).  Indication:  This is a 80 year old male with a right inguinal hernia extending into his scrotum.  He now presents for repair.  Technique:  He was seen in the holding room and the right groin was marked with my initials. A TAP block was performed by Dr. Glennon Mac. He then was brought to the operating, placed supine on the operating table, and the anesthetic was administered by the anesthesiologist. The hair in the right groin area was clipped as was felt to be necessary. This area was then sterilely prepped and draped. A timeout was performed.  Local anesthetic was infiltrated in the superficial and deep tissues in the right groin.  An incision was made through the skin and subcutaneous tissue until the external oblique aponeurosis was identified.  Local anesthetic was infiltrated deep to the external oblique aponeurosis. The external oblique aponeurosis was divided through the external ring medially and back toward the anterior superior iliac spine laterally. Using blunt dissection, the shelving edge of the inguinal ligament was identified inferiorly and the internal oblique aponeurosis and muscle were identified superiorly. The ilioinguinal nerve was identified and preserved.  The spermatic cord was isolated and a posterior window was made around it. A large indirect hernia sac was identified and separated from the spermatic cord using blunt dissection. The hernia sac and its contents were reduced through the patulous indirect hernia defect.   A piece of 3" x 6" polypropylene mesh was brought into the field and anchored 1-2 cm medial to the pubic tubercle with 2-0 Prolene suture. The inferior aspect of the mesh was anchored to  the shelving edge of the inguinal ligament with running 2-0 Prolene suture to a level 1-2 cm lateral to the internal ring. A slit was cut in the mesh creating 2 tails. These were wrapped around the spermatic cord. The superior aspect of the mesh was anchored to the internal oblique aponeurosis and muscle with interrupted 2-0 Vicryl sutures. The 2 tails of the mesh were then crossed creating a new internal ring and were anchored to the shelving edge of the inguinal ligament with 2-0 Prolene suture. The tip of a hemostat could be placed through the new aperture. The lateral aspect of the mesh was then tucked deep to the external oblique aponeurosis.  The wound was inspected and hemostasis was adequate. The external oblique aponeurosis was then closed over the mesh and cord with running 3-0 Vicryl suture. The subcutaneous tissue was closed with running 3-0 Vicryl suture. The skin closed with a running 4-0 Monocryl subcuticular stitch.  Steri-Strips and a sterile dressing were applied.  The procedure was well-tolerated without any apparent complications and he was taken to the recovery room in satisfactory condition.

## 2015-09-15 NOTE — Anesthesia Postprocedure Evaluation (Signed)
Anesthesia Post Note  Patient: ROLEN OSE  Procedure(s) Performed: Procedure(s) (LRB): OPEN REPAIR RIGHT INGUINAL HERNIA  (Right) INSERTION OF MESH (Right)  Patient location during evaluation: PACU Anesthesia Type: General and Regional Level of consciousness: awake and alert, oriented and patient cooperative Pain management: pain level controlled Vital Signs Assessment: post-procedure vital signs reviewed and stable Respiratory status: spontaneous breathing, nonlabored ventilation, respiratory function stable and patient connected to nasal cannula oxygen Cardiovascular status: blood pressure returned to baseline and stable Postop Assessment: no signs of nausea or vomiting Anesthetic complications: no    Last Vitals:  Filed Vitals:   09/15/15 1000 09/15/15 1015  BP: 137/67 134/70  Pulse: 55 51  Temp: 36.5 C 36.4 C  Resp: 13 14    Last Pain:  Filed Vitals:   09/15/15 1021  PainSc: 0-No pain                 Jodie Leiner,E. Sherill Wegener

## 2015-09-15 NOTE — Discharge Instructions (Addendum)
[facility] _______[facility]   INGUINAL HERNIA REPAIR: POST OP INSTRUCTIONS  Always review your discharge instruction sheet given to you by the facility where your surgery was performed. IF YOU HAVE DISABILITY OR FAMILY LEAVE FORMS, YOU MUST BRING THEM TO THE OFFICE FOR PROCESSING.   DO NOT GIVE THEM TO YOUR DOCTOR.  1. There can be a significant amount of pain for the first 3-4 days after surgery.  A  prescription for pain medication may be given to you upon discharge.  Take your pain medication as prescribed, if needed.  If narcotic pain medicine is not needed, then you may take acetaminophen (Tylenol) or ibuprofen (Advil) as needed. 2. Take your usually prescribed medications unless otherwise directed. 3. If you need a refill on your pain medication, please contact your pharmacy.  They will contact our office to request authorization. Prescriptions will not be filled after 5 pm or on week-ends. 4. You should follow a light diet the first 24 hours after arrival home, such as soup and crackers, etc.  Be sure to include lots of fluids daily.  Resume your normal diet the day after surgery. 5. Most patients will experience some swelling and bruising in the groin and scrotum.  Ice packs and reclining will help.  Swelling and bruising can take many days to resolve.  6. It is common to experience some constipation if taking pain medication after surgery.  Increasing fluid intake and taking a stool softener (such as Colace) will usually help or prevent this problem from occurring.  A mild laxative (Milk of Magnesia or Miralax) should be taken according to package directions if there are no bowel movements after 48 hours. 7. Unless discharge instructions indicate otherwise, you may remove your bandages 72 hours after surgery, and you may shower at that time.  You may have steri-strips (small skin tapes) in place directly over the incision.  These strips should be left on the skin.  If your surgeon  used skin glue on the incision, you may shower in 24 hours.  The glue will flake off over the next 2-3 weeks.  Any sutures or staples will be removed at the office during your follow-up visit. 8. ACTIVITIES:  You may resume regular (light) daily activities beginning the next day--such as daily self-care, walking, climbing stairs--gradually increasing activities as tolerated.  You may have sexual intercourse when it is comfortable.  Refrain from any heavy lifting or straining-nothing over 10 pounds for 6 weeks.  a. You may drive when you are no longer taking prescription pain medication, you can comfortably wear a seatbelt, and you can safely maneuver your car and apply brakes. b. RETURN TO WORK:  Desk work/Light work in 1-2 weeks, full duty in 6 weeks._________________________________________________________ 9. You should see your doctor in the office for a follow-up appointment approximately 2-3 weeks after your surgery.  Make sure that you call for this appointment within a day or two after you arrive home to insure a convenient appointment time. 10. OTHER INSTRUCTIONS:  __________________________________________________________________________________________________________________________________________________________________________________________  WHEN TO CALL YOUR DOCTOR: 1. Fever over 101.0 2. Inability to urinate 3. Nausea and/or vomiting 4. Extreme swelling or bruising 5. Continued bleeding from incision. 6. Increased pain, redness, or drainage from the incision  The clinic staff is available to answer your questions during regular business hours.  Please dont hesitate to call and ask to speak to one of the nurses for clinical concerns.  If you have a medical emergency, go to the nearest emergency  room or call 911.  A surgeon from [facility] is always on call at the hospital   [address] [place], [place], [place]  [address] ?  [address], [place], [place] [phone] ? [phone] ? FAX [phone] Web site: [email or web address]

## 2015-09-15 NOTE — Transfer of Care (Signed)
Immediate Anesthesia Transfer of Care Note  Patient: Juan [name]  Procedure(s) Performed: Procedure(s): OPEN REPAIR RIGHT INGUINAL HERNIA  (Right) INSERTION OF MESH (Right)  Patient Location: PACU  Anesthesia Type:General  Level of Consciousness: sedated  Airway & Oxygen Therapy: Patient Spontanous Breathing and Patient connected to nasal cannula oxygen  Post-op Assessment: Report given to RN and Post -op Vital signs reviewed and stable  Post vital signs: Reviewed and stable  Last Vitals:  Filed Vitals:   09/15/15 0550  BP: 129/70  Pulse: 72  Temp: 36.3 C  Resp: 18    Complications: No apparent anesthesia complications

## 2015-09-15 NOTE — H&P (View-Only) (Signed)
Juan [name] 08/30/2015 2:23 PM Location: [facility] Patient #: [id number] DOB: [birth year] Widowed / Language: Cleophus Molt / Race: White Male  History of Present Illness [name] MD; 08/30/2015 3:04 PM) The patient is a 80 year old male.   Note:He is referred by Dr. Diona Fanti for consultation regarding a large right inguinal hernia. He noted some swelling in the right groin area about 2 months ago. He occasionally has some stinging pain in that region. He states he does not strain to urinate or have a bowel movement. He saw Dr. Diona Fanti who diagnosed the hernia and sent him here for further evaluation and treatment. He is hard of hearing. His daughter is with him.  His PMH is notable for HTN, CAD s/p CABG, Hyperlipidemia, GERD, BPH (well controlled), pancreatitis, TIA  Other Problems Elbert Ewings, CMA; 08/30/2015 2:23 PM) High blood pressure Hypercholesterolemia Pancreatitis  Past Surgical History Elbert Ewings, CMA; 08/30/2015 2:23 PM) Gallbladder Surgery - Laparoscopic Thyroid Surgery Valve Replacement  Diagnostic Studies History Elbert Ewings, CMA; 08/30/2015 2:23 PM) Colonoscopy 1-5 years ago  Allergies Elbert Ewings, CMA; 08/30/2015 2:24 PM) Simvastatin *CHEMICALS*  Medication History Elbert Ewings, CMA; 08/30/2015 2:25 PM) Allopurinol (100MG  Tablet, Oral) Active. Finasteride (5MG  Tablet, Oral) Active. Metoprolol Tartrate (50MG  Tablet, Oral) Active. Pravastatin Sodium (20MG  Tablet, Oral) Active. Fenofibric Acid (135MG  Capsule DR, Oral) Active. Aspirin (81MG  Tablet, Oral) Active. Medications Reconciled  Family History Elbert Ewings, [place]; 08/30/2015 2:23 PM) Hypertension Father, Mother.     Review of Systems Elbert Ewings CMA; 08/30/2015 2:23 PM) General Not Present- Appetite Loss, Chills, Fatigue, Fever, Night Sweats, Weight Gain and Weight Loss. Skin Not Present- Change in Wart/Mole, Dryness, Hives, Jaundice, New Lesions, Non-Healing  Wounds, Rash and Ulcer. HEENT Present- Hearing Loss and Wears glasses/contact lenses. Not Present- Earache, Hoarseness, Nose Bleed, Oral Ulcers, Ringing in the Ears, Seasonal Allergies, Sinus Pain, Sore Throat, Visual Disturbances and Yellow Eyes. Respiratory Not Present- Bloody sputum, Chronic Cough, Difficulty Breathing, Snoring and Wheezing. Breast Not Present- Breast Mass, Breast Pain, Nipple Discharge and Skin Changes. Cardiovascular Not Present- Chest Pain, Difficulty Breathing Lying Down, Leg Cramps, Palpitations, Rapid Heart Rate, Shortness of Breath and Swelling of Extremities. Male Genitourinary Not Present- Blood in Urine, Change in Urinary Stream, Frequency, Impotence, Nocturia, Painful Urination, Urgency and Urine Leakage. Musculoskeletal Not Present- Back Pain, Joint Pain, Joint Stiffness, Muscle Pain, Muscle Weakness and Swelling of Extremities. Neurological Not Present- Decreased Memory, Fainting, Headaches, Numbness, Seizures, Tingling, Tremor, Trouble walking and Weakness. Psychiatric Not Present- Anxiety, Bipolar, Change in Sleep Pattern, Depression, Fearful and Frequent crying. Hematology Not Present- Easy Bruising, Excessive bleeding, Gland problems, HIV and Persistent Infections.  Vitals Elbert Ewings CMA; 08/30/2015 2:25 PM) 08/30/2015 2:25 PM Weight: 177 lb Height: 71in Body Surface Area: 2 m Body Mass Index: 24.69 kg/m  Temp.: 98.73F(Temporal)  Pulse: 82 (Regular)  BP: 130/70 (Sitting, Left Arm, Standard)      Physical Exam [name] MD; 08/30/2015 3:02 PM)  The physical exam findings are as follows: Note:General: WDWN in NAD. Pleasant and cooperative.  HEENT: [place]/AT, hearing aid in right ear  EYES: no icterus  CV: RRR, no murmur, no JVD.  CHEST: Breath sounds equal and clear. Respirations nonlabored.  ABDOMEN: Soft, nontender, nondistended, no umbilical bulge  GU: Large right inguinal bulge extending down into the scrotum but is  reducible in the supine position. Very small left inguinal bulge felt with a cough in the standing position.  SKIN: No jaundice.  NEUROLOGIC: Alert and oriented, answers questions  appropriately, normal gait and station.  PSYCHIATRIC: Normal mood, affect , and behavior.    Assessment & Plan [name] MD; 08/30/2015 2:57 PM)  RIGHT INGUINAL HERNIA (K40.90) Impression: This is a large hernia extending into the scrotum. It is reducible and the supine position. He has a tiny left inguinal hernia. I do not think needs to be addressed at this time. We discussed the rare problem of incarceration and what to do if that happened.  Plan: I recommended open right inguinal hernia repair with mesh. I suggested he get some hernia underwear to help with the discomfort until we get the surgery scheduled. I have explained the procedure, risks, and aftercare of inguinal hernia repair. Risks include but are not limited to bleeding, infection, wound problems, anesthesia, recurrence, bladder or intestine injury, urinary retention, testicular dysfunction and swelling, chronic pain, mesh problems. He and his daughter seem to understand and agree with the plan.  Jackolyn Confer, MD

## 2015-09-16 DIAGNOSIS — K4090 Unilateral inguinal hernia, without obstruction or gangrene, not specified as recurrent: Secondary | ICD-10-CM | POA: Diagnosis not present

## 2015-09-16 MED ORDER — HYDROCODONE-ACETAMINOPHEN 5-325 MG PO TABS: 1.0000 | ORAL_TABLET | ORAL | Status: DC | PRN

## 2015-09-16 NOTE — Progress Notes (Signed)
Discharge instructions given. Pt verbalized understanding and all questions were answered.  

## 2015-09-16 NOTE — Discharge Summary (Signed)
Physician Discharge Summary  Patient ID: Juan [name] MRN: [id number] DOB/AGE: [birth year] 80 y.o.  Admit date: 09/15/2015 Discharge date: 09/16/2015  Admission Diagnoses:  Large right inguinal hernia  Discharge Diagnoses:  Principal Problem:   Inguinal hernia, right s/p repair with mesh 09/15/15   Discharged Condition: good  Hospital Course: He underwent right inguinal hernia repair with mesh and had no complications overnight.  He was able to be discharged on POD #1.  Discharge instructions were given to him and his daughters.   Discharge Exam: Blood pressure 113/55, pulse 60, temperature 97.4 F (36.3 C), temperature source Oral, resp. rate 18, height 5' 10.5" (1.791 m), weight 77.905 kg (171 lb 12 oz), SpO2 98 %.   Disposition: 03-Skilled Nursing Facility     Medication List    TAKE these medications        allopurinol 100 MG tablet  Commonly known as:  ZYLOPRIM  Take 1 tablet (100 mg total) by mouth daily. --- Please establish with new PCP for further refills     aspirin EC 81 MG tablet  Take 81 mg by mouth daily.     Fenofibric Acid 135 MG Cpdr  Take 1 capsule by mouth daily.     finasteride 5 MG tablet  Commonly known as:  PROSCAR  Take 5 mg by mouth daily.     HYDROcodone-acetaminophen 5-325 MG tablet  Commonly known as:  NORCO/VICODIN  Take 1-2 tablets by mouth every 4 (four) hours as needed for moderate pain.     metoprolol 50 MG tablet  Commonly known as:  LOPRESSOR  Take 0.5 tablets (25 mg total) by mouth 2 (two) times daily.     pravastatin 20 MG tablet  Commonly known as:  PRAVACHOL  Take 20 mg by mouth every Monday, Wednesday, and Friday. Pt takes Mon, [place], Friday     pravastatin 20 MG tablet  Commonly known as:  PRAVACHOL  TAKE 1 TABLET BY MOUTH EVERY DAY         Signed: Bogdan Vivona J 09/16/2015, 9:10 AM

## 2015-09-16 NOTE — Progress Notes (Signed)
1 Day Post-Op  Subjective: Pain well-controlled when sitting.  Increased discomfort when walking. Voiding ok.  Objective: Vital signs in last 24 hours: Temp:  [97.4 F (36.3 C)-98 F (36.7 C)] 97.4 F (36.3 C) (03/31 0500) Pulse Rate:  [49-68] 60 (03/31 0500) Resp:  [12-18] 18 (03/31 0500) BP: (113-155)/(55-93) 113/55 mmHg (03/31 0500) SpO2:  [98 %-100 %] 98 % (03/31 0500)    Intake/Output from previous day: 03/30 0701 - 03/31 0700 In: 1800 [P.O.:600; I.V.:1200] Out: 2175 [Urine:2150; Blood:25] Intake/Output this shift:    PE: General- In NAD GU-right groin dressing dry, some bruising  Lab Results:   Recent Labs  09/14/15 1440  WBC 4.9  HGB 12.8*  HCT 37.1*  PLT 225   BMET  Recent Labs  09/14/15 1440  NA 141  K 3.7  CL 108  CO2 25  GLUCOSE 141*  BUN 34*  CREATININE 1.35*  CALCIUM 10.2   PT/INR  Recent Labs  09/14/15 1440  LABPROT 14.9  INR 1.15   Comprehensive Metabolic Panel:    Component Value Date/Time   NA 141 09/14/2015 1440   NA 140 06/29/2015 1625   K 3.7 09/14/2015 1440   K 3.5 06/29/2015 1625   CL 108 09/14/2015 1440   CL 105 06/29/2015 1625   CO2 25 09/14/2015 1440   CO2 30 06/29/2015 1625   BUN 34* 09/14/2015 1440   BUN 33* 06/29/2015 1625   CREATININE 1.35* 09/14/2015 1440   CREATININE 1.43 06/29/2015 1625   GLUCOSE 141* 09/14/2015 1440   GLUCOSE 107* 06/29/2015 1625   GLUCOSE 127* 05/16/2006 1101   CALCIUM 10.2 09/14/2015 1440   CALCIUM 10.5 06/29/2015 1625   AST 26 09/14/2015 1440   AST 22 06/29/2015 1625   ALT 17 09/14/2015 1440   ALT 15 06/29/2015 1625   ALKPHOS 35* 09/14/2015 1440   ALKPHOS 38* 06/29/2015 1625   BILITOT 0.7 09/14/2015 1440   BILITOT 0.6 06/29/2015 1625   PROT 6.8 09/14/2015 1440   PROT 6.7 06/29/2015 1625   ALBUMIN 4.1 09/14/2015 1440   ALBUMIN 4.2 06/29/2015 1625     Studies/Results: No results found.  Anti-infectives: Anti-infectives    Start     Dose/Rate Route Frequency Ordered  Stop   09/15/15 0600  ceFAZolin (ANCEF) IVPB 2g/100 mL premix     2 g 200 mL/hr over 30 Minutes Intravenous On call to O.R. 09/15/15 0600 09/15/15 [id number]      Assessment Principal Problem:   Inguinal hernia, right s/p repair with mesh 09/15/15-doing well; voiding okay.    LOS: 1 day   Plan: Discharge.  Instructions given to him.   Tyashia Morrisette J 09/16/2015

## 2015-09-21 ENCOUNTER — Other Ambulatory Visit: Payer: Self-pay | Admitting: Internal Medicine

## 2015-12-24 ENCOUNTER — Other Ambulatory Visit: Payer: Self-pay | Admitting: Internal Medicine

## 2015-12-25 ENCOUNTER — Other Ambulatory Visit: Payer: Self-pay | Admitting: Internal Medicine

## 2015-12-29 ENCOUNTER — Ambulatory Visit (INDEPENDENT_AMBULATORY_CARE_PROVIDER_SITE_OTHER): Payer: Federal, State, Local not specified - PPO | Admitting: Internal Medicine

## 2015-12-29 ENCOUNTER — Other Ambulatory Visit (INDEPENDENT_AMBULATORY_CARE_PROVIDER_SITE_OTHER): Payer: Federal, State, Local not specified - PPO

## 2015-12-29 ENCOUNTER — Encounter: Payer: Self-pay | Admitting: Internal Medicine

## 2015-12-29 VITALS — BP 142/76 | HR 59 | Temp 97.5°F | Resp 16 | Wt 174.0 lb

## 2015-12-29 DIAGNOSIS — R7303 Prediabetes: Secondary | ICD-10-CM | POA: Diagnosis not present

## 2015-12-29 DIAGNOSIS — N189 Chronic kidney disease, unspecified: Secondary | ICD-10-CM

## 2015-12-29 DIAGNOSIS — I10 Essential (primary) hypertension: Secondary | ICD-10-CM

## 2015-12-29 DIAGNOSIS — Z23 Encounter for immunization: Secondary | ICD-10-CM

## 2015-12-29 DIAGNOSIS — Z8639 Personal history of other endocrine, nutritional and metabolic disease: Secondary | ICD-10-CM

## 2015-12-29 DIAGNOSIS — E782 Mixed hyperlipidemia: Secondary | ICD-10-CM

## 2015-12-29 DIAGNOSIS — R2689 Other abnormalities of gait and mobility: Secondary | ICD-10-CM

## 2015-12-29 DIAGNOSIS — Z8739 Personal history of other diseases of the musculoskeletal system and connective tissue: Secondary | ICD-10-CM

## 2015-12-29 LAB — CBC WITH DIFFERENTIAL/PLATELET
BASOS ABS: 0 10*3/uL (ref 0.0–0.1)
Basophils Relative: 0.5 % (ref 0.0–3.0)
Eosinophils Absolute: 0.1 10*3/uL (ref 0.0–0.7)
Eosinophils Relative: 2.8 % (ref 0.0–5.0)
HEMATOCRIT: 40 % (ref 39.0–52.0)
Hemoglobin: 13.7 g/dL (ref 13.0–17.0)
LYMPHS ABS: 1.7 10*3/uL (ref 0.7–4.0)
LYMPHS PCT: 31.6 % (ref 12.0–46.0)
MCHC: 34.2 g/dL (ref 30.0–36.0)
MCV: 86.3 fl (ref 78.0–100.0)
Monocytes Absolute: 0.6 10*3/uL (ref 0.1–1.0)
Monocytes Relative: 11.1 % (ref 3.0–12.0)
NEUTROS ABS: 2.8 10*3/uL (ref 1.4–7.7)
Neutrophils Relative %: 54 % (ref 43.0–77.0)
PLATELETS: 298 10*3/uL (ref 150.0–400.0)
RBC: 4.63 Mil/uL (ref 4.22–5.81)
RDW: 14.3 % (ref 11.5–15.5)
WBC: 5.3 10*3/uL (ref 4.0–10.5)

## 2015-12-29 LAB — COMPREHENSIVE METABOLIC PANEL
ALT: 15 U/L (ref 0–53)
AST: 20 U/L (ref 0–37)
Albumin: 4.4 g/dL (ref 3.5–5.2)
Alkaline Phosphatase: 39 U/L (ref 39–117)
BILIRUBIN TOTAL: 0.6 mg/dL (ref 0.2–1.2)
BUN: 33 mg/dL — ABNORMAL HIGH (ref 6–23)
CALCIUM: 10.5 mg/dL (ref 8.4–10.5)
CHLORIDE: 107 meq/L (ref 96–112)
CO2: 30 meq/L (ref 19–32)
Creatinine, Ser: 1.35 mg/dL (ref 0.40–1.50)
GFR: 54.34 mL/min — AB (ref >60.00)
Glucose, Bld: 91 mg/dL (ref 70–99)
Potassium: 4.2 mEq/L (ref 3.5–5.1)
Sodium: 141 mEq/L (ref 135–145)
Total Protein: 7.2 g/dL (ref 6.0–8.3)

## 2015-12-29 LAB — HEMOGLOBIN A1C: HEMOGLOBIN A1C: 5.8 % (ref 4.6–6.5)

## 2015-12-29 MED ORDER — AMLODIPINE BESYLATE 2.5 MG PO TABS: 2.5000 mg | ORAL_TABLET | Freq: Every day | ORAL | Status: DC

## 2015-12-29 NOTE — Assessment & Plan Note (Signed)
Not fasting We'll check lipid panel on his next visit Continue pravastatin at current dose

## 2015-12-29 NOTE — Assessment & Plan Note (Signed)
Sugars on average and prediabetic range at home Check A1c

## 2015-12-29 NOTE — Assessment & Plan Note (Signed)
No recent flares Continue allopurinol at current dose

## 2015-12-29 NOTE — Patient Instructions (Addendum)
  Test(s) ordered today. Your results will be released to [place] [place] (or called to you) after review, usually within 72hours after test completion. If any changes need to be made, you will be notified at that same time.   prevnar vaccine administered today.   Medications reviewed and updated.  Changes include adding amlodipine 2.5 mg daily.  Monitor your BP at home.  Your goal BP is less than 140/90 on average.   Your prescription(s) have been submitted to your pharmacy. Please take as directed and contact our office if you believe you are having problem(s) with the medication(s).   Please followup in 6 months

## 2015-12-29 NOTE — Assessment & Plan Note (Signed)
Check CMP We'll try to get blood pressure better controlled

## 2015-12-29 NOTE — Progress Notes (Signed)
Pre visit review using our clinic review tool, if applicable. No additional management support is needed unless otherwise documented below in the visit note. 

## 2015-12-29 NOTE — Assessment & Plan Note (Signed)
He has chronic kidney disease and his blood pressure is slightly higher than ideal Continue current dose of metoprolol Add Amlodipine 2.5 mg daily We will continue to monitor closely

## 2015-12-29 NOTE — Progress Notes (Signed)
Subjective:    Patient ID: Juan [name], male    DOB: 05/25/1936, 80 y.o.   MRN: [id number]  HPI He is here for follow up.  He broke his arm last year and had surgery.  He has done PT and the recovery has been slow.  Negative for infiltrate this point and is not able to lift it above his head. He had a hernia repair in march.    CAD, Hypertension: He is taking his medication daily. He is compliant with a low sodium diet.  He denies chest pain, palpitations, edema, shortness of breath and regular headaches. He is exercising regularly.  He does monitor his blood pressure at home, 134/86 - 147/80.Marland Kitchen    Hyperlipidemia: He is taking his medication daily. He is compliant with a low fat/cholesterol diet. He is exercising regularly. He denies myalgias.   History of gout: He is taking the allopurinol daily and denies any recent gout attacks.  Prediabetes:  He is compliant with a low sugar/carbohydrate diet.  He is exercising regularly - three times a week he goes to an exercise class and walks a little.  He checks his sugars and then have been 85-140.       Medications and allergies reviewed with patient and updated if appropriate.  Patient Active Problem List   Diagnosis Date Noted  . BPH (benign prostatic hypertrophy) with urinary obstruction 08/28/2015  . Erectile dysfunction due to arterial insufficiency 08/28/2015  . Inguinal hernia, right s/p repair with mesh 09/15/15 08/28/2015  . Prediabetes 06/29/2015  . Fracture, humerus, proximal 12/30/2014  . Dysplastic nevus 09/05/2014  . History of pancreatitis 06/25/2013  . CKD (chronic kidney disease) 06/24/2013  . Syncope 06/19/2013  . TIA (transient ischemic attack) 02/07/2012  . GERD 04/03/2010  . Essential hypertension, benign 03/02/2009  . DUODENITIS 03/01/2009  . DIVERTICULAR DISEASE 03/01/2009  . PERSONAL HX COLONIC POLYPS 03/19/2008  . History of gout 12/09/2007  . Coronary atherosclerosis 12/09/2007  . Mixed hyperlipidemia  06/04/2007    Current Outpatient Prescriptions on File Prior to Visit  Medication Sig Dispense Refill  . allopurinol (ZYLOPRIM) 100 MG tablet TAKE 1 TABLET BY MOUTH DAILY 90 tablet 1  . aspirin EC 81 MG tablet Take 81 mg by mouth daily.    . Choline Fenofibrate (FENOFIBRIC ACID) 135 MG CPDR Take 1 capsule by mouth daily. 30 capsule 5  . finasteride (PROSCAR) 5 MG tablet Take 5 mg by mouth daily.    . metoprolol (LOPRESSOR) 50 MG tablet Take 0.5 tablets (25 mg total) by mouth 2 (two) times daily. 90 tablet 3  . pravastatin (PRAVACHOL) 20 MG tablet Take 20 mg by mouth every Monday, Wednesday, and Friday. Pt takes Mon, Wed, Friday 30 tablet 11  . pravastatin (PRAVACHOL) 20 MG tablet TAKE 1 TABLET BY MOUTH EVERY DAY 30 tablet 5   Current Facility-Administered Medications on File Prior to Visit  Medication Dose Route Frequency Provider Last Rate Last Dose  . 0.9 %  sodium chloride infusion   Intravenous Continuous Brad Dixon, PA-C      . chlorhexidine (HIBICLENS) 4 % liquid 4 application  60 mL Topical Once Merla Riches, PA-C        Past Medical History  Diagnosis Date  . Hyperlipidemia   . Duodenitis   . Gout   . Diverticular disease   . Elevated PSA   . Tubular adenoma polyp of rectum   . Pancreatitis     a. ? Simvastatin related  .  GERD (gastroesophageal reflux disease)   . Hydronephrosis with ureteropelvic junction obstruction   . Hypertension   . TIA (transient ischemic attack)     a. 01/2012 - presentation as acute imbalance;  b. 01/2012 carotid U/S w/o signif stenosis;  c. 01/2012 Echo: EF 60-65%, Gr 1 DD.;  d.  Carotid US (06/20/2013): 1-39% bilateral ICA stenosis  . Hypercalcemia 02/02/12    a. 01/2012: 10.7.  Marland Kitchen Syncope     a. 06/2013  . Type II diabetes mellitus ([place])     "diet and exercise controlled at present" (06/19/2013)  . Coronary artery disease     a. s/p CABG 2000; b. myoview 9/08: inf-septal scar with mild peri-infarct ischemia (low risk);  c.  Echo 8/13: mild focal basal  septal hypertrophy, Gr 1 DD, mild LAE;  d. Lexiscan Myoview (06/20/2013): No reversible ischemia, inferior and septal infarct, inferior and septal hypokinesis, EF 60%;  e.  Echo (06/20/2011): EF 50-55%, normal wall motion, grade 1 diastolic dysfunction, mild LAE  . CKD (chronic kidney disease) 06/24/2013  . Humerus fracture     right  . Cancer ([place])     skin  . Pneumonia age 75 or 33    "I think only once" (06/19/2013)    Past Surgical History  Procedure Laterality Date  . Cochlear implant Right 1990's  . Appendectomy    . Cholecystectomy    . Colonoscopy w/ polypectomy      no polyp 2012  . Parathyroidectomy  1978  . Polypectomy    . Cardiac catheterization  2000  . Tonsillectomy  1943  . Reverse shoulder arthroplasty Right 12/30/2014    Procedure: Right shoulder ORIF, Biomet S3 Plate and ZImmer cable system ;  Surgeon: Netta Cedars, MD;  Location: [facility];  Service: Orthopedics;  Laterality: Right;  . Coronary artery bypass graft  2000    CABG X5  . Trachestomy as child for throat closing    . Right ankle surgery  age 80  . Inguinal hernia repair Right 09/15/2015    Procedure: OPEN REPAIR RIGHT INGUINAL HERNIA ;  Surgeon: Jackolyn Confer, MD;  Location: [facility];  Service: General;  Laterality: Right;  . Insertion of mesh Right 09/15/2015    Procedure: INSERTION OF MESH;  Surgeon: Jackolyn Confer, MD;  Location: [facility];  Service: General;  Laterality: Right;    Social History   Social History  . Marital Status: Widowed    Spouse Name: N/A  . Number of Children: N/A  . Years of Education: N/A   Occupational History  . retired    Social History Main Topics  . Smoking status: Current Some Day Smoker -- 0 years    Types: Cigars  . Smokeless tobacco: Never Used     Comment: 06/19/2013 "rare cigar"  . Alcohol Use: Yes     Comment: 12/29/14 "might have a drink once/month"  . Drug Use: No  . Sexual Activity: Not Currently   Other Topics Concern  . None   Social History Narrative   Does  get regular exercise    Family History  Problem Relation Age of Onset  . Melanoma Father   . Heart attack Father      in 43s  . Diabetes Neg Hx     Review of Systems  Constitutional: Negative for fever and appetite change.  Respiratory: Negative for cough, shortness of breath and wheezing.   Cardiovascular: Negative for chest pain, palpitations and leg swelling.  Gastrointestinal: Negative for abdominal pain.  No GERD  Musculoskeletal: Positive for gait problem.  Neurological: Negative for dizziness, light-headedness and headaches.       Objective:   Filed Vitals:   12/29/15 0928  BP: 142/76  Pulse: 59  Temp: 97.5 F (36.4 C)  Resp: 16   Filed Weights   12/29/15 0928  Weight: 174 lb (78.926 kg)   Body mass index is 24.61 kg/(m^2).   Physical Exam  Constitutional: He appears well-developed and well-nourished. No distress.  HENT:  Head: Normocephalic and atraumatic.  Eyes: Conjunctivae are normal.  Neck: Neck supple. No tracheal deviation present. No thyromegaly present.  Cardiovascular: Normal rate, regular rhythm and normal heart sounds.   No murmur heard. Pulmonary/Chest: Effort normal and breath sounds normal. No respiratory distress. He has no wheezes. He has no rales.  Musculoskeletal: He exhibits no edema.  Neurological: He exhibits normal muscle tone.  Shuffling gait, no tremor, no rigidity, normal strength and sensation bilateral upper and lower extremities  Skin: Skin is warm and dry. He is not diaphoretic.  Psychiatric: He has a normal mood and affect. His behavior is normal.          Assessment & Plan:    prevnar today   See Problem List for Assessment and Plan of chronic medical problems.  F/u in 6 months

## 2015-12-29 NOTE — Assessment & Plan Note (Signed)
He denies balance problems He did have one fall this year, but feels that was accidental Neuro exam fairly unremarkable Discussed physical therapy and referral to neurology for further evaluation and he declined both Encouraged him to remain active and discussed fall precautions

## 2015-12-30 ENCOUNTER — Telehealth: Payer: Self-pay | Admitting: Emergency Medicine

## 2015-12-30 NOTE — Telephone Encounter (Signed)
Pts daughter called and has questions about a medication pt received today. Please give her a call back thanks.

## 2015-12-30 NOTE — Telephone Encounter (Signed)
Spoke with pts daughter to clarify medication change

## 2016-01-02 ENCOUNTER — Encounter: Payer: Self-pay | Admitting: Emergency Medicine

## 2016-01-17 NOTE — Progress Notes (Signed)
Patient ID: Juan [name], male   DOB: [birth year], 80 y.o.   MRN: [id number]   Juan [name] is a 80 y.o.  male with a hx of CAD, s/p CABG in 2000, HTN, HL, deafness s/p cochlear implant. Last myoview 9/08: inf-septal scar with mild peri-infarct ischemia (low risk). He was seen in the ED with acute onset of imbalance in 01/2012. Seen by neurology. Head CT (MRI not done b/c of cochlear implant) was neg. Carotid U/S: no ICA stenosis. Echo 8/13: mild focal basal septal hypertrophy, Gr 1 DD, mild LAE. Saw neurology in f/u and episode was felt to be either dehydration or a TIA. ASA was continued.  Patient was recently admitted to the hospital 1/2-1/3 with syncope. His syncopal episode occurred while lifting weights. Syncopal episode was accompanied by nausea and vomiting. Head CT demonstrated no acute findings. Carotid US (06/20/2013): 1-39% bilateral ICA stenosis. Echo (06/20/2011): EF 50-55%, normal wall motion, grade 1 diastolic dysfunction, mild LAE. He was evaluated by [place]  Symptoms sound more vasovagal than cardiac in nature. Lexiscan Myoview (06/20/2013): No reversible ischemia, inferior and septal infarct, inferior and septal hypokinesis, EF 60%. Outpatient event monitor was recommended.   01/16/15 fell on stairs and had right displaced humeral fracture surgically repaired by Dr Veverly Fells Still with poor ROM    ROS: Denies fever, malais, weight loss, blurry vision, decreased visual acuity, cough, sputum, SOB, hemoptysis, pleuritic pain, palpitaitons, heartburn, abdominal pain, melena, lower extremity edema, claudication, or rash.  All other systems reviewed and negative  General: Affect appropriate Healthy:  appears stated age 80: choclear implant  Neck supple with no adenopathy JVP normal no bruits no thyromegaly Lungs clear with no wheezing and good diaphragmatic motion Heart:  S1/S2 SEM  murmur, no rub, gallop or click PMI normal Abdomen: benighn, BS positve, no tenderness, no AAA no bruit.  No HSM or  HJR Distal pulses intact with no bruits No edema Neuro non-focal Skin warm and dry Frozen right shoulder post humeral fracture    Current Outpatient Prescriptions  Medication Sig Dispense Refill  . allopurinol (ZYLOPRIM) 100 MG tablet TAKE 1 TABLET BY MOUTH DAILY 90 tablet 1  . amLODipine (NORVASC) 2.5 MG tablet Take 1 tablet (2.5 mg total) by mouth daily. 90 tablet 3  . aspirin EC 81 MG tablet Take 81 mg by mouth daily.    . Choline Fenofibrate (FENOFIBRIC ACID) 135 MG CPDR Take 1 capsule by mouth daily. 30 capsule 5  . finasteride (PROSCAR) 5 MG tablet Take 5 mg by mouth daily.    . metoprolol (LOPRESSOR) 50 MG tablet Take 0.5 tablets (25 mg total) by mouth 2 (two) times daily. 90 tablet 3  . pravastatin (PRAVACHOL) 20 MG tablet Take 20 mg by mouth every Monday, Wednesday, and Friday. Pt takes Juan [name], Friday 30 tablet 11   No current facility-administered medications for this visit.    Facility-Administered Medications Ordered in Other Visits  Medication Dose Route Frequency Provider Last Rate Last Dose  . 0.9 %  sodium chloride infusion   Intravenous Continuous Brad Dixon, PA-C      . chlorhexidine (HIBICLENS) 4 % liquid 4 application  60 mL Topical Once Kellogg, PA-C        Allergies  Simvastatin  Electrocardiogram:  SR rate 61 nonspecific St T wave changes 08/25/13  SR rate 68 low voltage nonspecific ST changes no difference 06/29/14 01/19/16  SR rate 62 septal infarct no acute changes   Assessment and Plan  CAD/CABG:  Distant 2000 no angina continue medical Rx Myovue 1/15 with old IMI no ischemia EF normal  Syncope:  Non cardiac carotids plaque no stenosis  Chol:   Cholesterol is at goal.  Continue current dose of statin and diet Rx.  No myalgias or side effects.  F/U  Labs with Dr Thom Chimes in January told him to be fasting for that appt Lab Results  Component Value Date   [facility] 90 02/04/2014            Prostate:  F/U PSA Dr Braulio Bosch continue Proscar Ortho:   Needs more PT for frozen left shoulder post surgery/trauma f/u Dr Dierdre Harness

## 2016-01-19 ENCOUNTER — Encounter (INDEPENDENT_AMBULATORY_CARE_PROVIDER_SITE_OTHER): Payer: Self-pay

## 2016-01-19 ENCOUNTER — Ambulatory Visit (INDEPENDENT_AMBULATORY_CARE_PROVIDER_SITE_OTHER): Payer: Federal, State, Local not specified - PPO | Admitting: Cardiovascular Disease

## 2016-01-19 ENCOUNTER — Encounter: Payer: Self-pay | Admitting: Cardiovascular Disease

## 2016-01-19 VITALS — BP 128/71 | HR 59 | Ht 71.0 in | Wt 175.1 lb

## 2016-01-19 DIAGNOSIS — G459 Transient cerebral ischemic attack, unspecified: Secondary | ICD-10-CM | POA: Diagnosis not present

## 2016-01-19 NOTE — Patient Instructions (Addendum)

## 2016-02-03 ENCOUNTER — Other Ambulatory Visit: Payer: Self-pay | Admitting: Internal Medicine

## 2016-02-29 ENCOUNTER — Telehealth: Payer: Self-pay | Admitting: Emergency Medicine

## 2016-02-29 NOTE — Telephone Encounter (Signed)
Can he come in next week and bring his home BP cuff.

## 2016-02-29 NOTE — Telephone Encounter (Signed)
Are you okay with pt coming in for a nurse visit for BP check or would you like it to be an office visit?

## 2016-02-29 NOTE — Telephone Encounter (Signed)
Please advise 

## 2016-02-29 NOTE — Telephone Encounter (Signed)
Patients daughter called and said pts blood pressure is starting to go up. He has recently started taking a new medication. His blood pressures are   8/19- 141/80 9/2-142/55 9/9- 149/80 Please advise what to do thanks.

## 2016-03-01 NOTE — Telephone Encounter (Signed)
Pt has appt scheduled for Monday 9/18

## 2016-03-01 NOTE — Telephone Encounter (Signed)
Office visit

## 2016-03-05 ENCOUNTER — Ambulatory Visit (INDEPENDENT_AMBULATORY_CARE_PROVIDER_SITE_OTHER): Payer: Federal, State, Local not specified - PPO | Admitting: Internal Medicine

## 2016-03-05 ENCOUNTER — Encounter: Payer: Self-pay | Admitting: Internal Medicine

## 2016-03-05 VITALS — BP 126/70 | HR 63 | Temp 97.8°F | Resp 16 | Wt 179.0 lb

## 2016-03-05 DIAGNOSIS — Z23 Encounter for immunization: Secondary | ICD-10-CM | POA: Diagnosis not present

## 2016-03-05 DIAGNOSIS — I10 Essential (primary) hypertension: Secondary | ICD-10-CM

## 2016-03-05 NOTE — Patient Instructions (Addendum)
Continue to monitor your blood pressure at home.   All other Health Maintenance issues reviewed.   All recommended immunizations and age-appropriate screenings are up-to-date or discussed.  Flu vaccine administered today.   Medications reviewed and updated.  No changes recommended at this time.

## 2016-03-05 NOTE — Assessment & Plan Note (Signed)
BP controlled here today, has been slightly elevated at home on occasion BP controlled on average Current regimen effective and well tolerated Continue current medications at current doses

## 2016-03-05 NOTE — Progress Notes (Signed)
Subjective:    Patient ID: Juan [name], male    DOB: April 20, 1936, 80 y.o.   MRN: [id number]  HPI The patient is here for follow up.  Hypertension: He is taking his medication daily. He is compliant with a low sodium diet.  He denies chest pain, palpitations, edema, shortness of breath and regular headaches. He is exercising regularly - three times a week.  He does monitor his blood pressure at home: 117/63, 149/80, 142/55, 140/71, 141/80, 138/75, 131/76.       Medications and allergies reviewed with patient and updated if appropriate.  Patient Active Problem List   Diagnosis Date Noted  . Shuffling gait 12/29/2015  . BPH (benign prostatic hypertrophy) with urinary obstruction 08/28/2015  . Erectile dysfunction due to arterial insufficiency 08/28/2015  . Inguinal hernia, right s/p repair with mesh 09/15/15 08/28/2015  . Prediabetes 06/29/2015  . Fracture, humerus, proximal 12/30/2014  . Dysplastic nevus 09/05/2014  . History of pancreatitis 06/25/2013  . CKD (chronic kidney disease) 06/24/2013  . Syncope 06/19/2013  . TIA (transient ischemic attack) 02/07/2012  . GERD 04/03/2010  . Essential hypertension, benign 03/02/2009  . DUODENITIS 03/01/2009  . DIVERTICULAR DISEASE 03/01/2009  . PERSONAL HX COLONIC POLYPS 03/19/2008  . History of gout 12/09/2007  . Coronary atherosclerosis 12/09/2007  . Mixed hyperlipidemia 06/04/2007    Current Outpatient Prescriptions on File Prior to Visit  Medication Sig Dispense Refill  . allopurinol (ZYLOPRIM) 100 MG tablet TAKE 1 TABLET BY MOUTH DAILY 90 tablet 1  . amLODipine (NORVASC) 2.5 MG tablet Take 1 tablet (2.5 mg total) by mouth daily. 90 tablet 3  . aspirin EC 81 MG tablet Take 81 mg by mouth daily.    . Choline Fenofibrate (FENOFIBRIC ACID) 135 MG CPDR Take 1 capsule by mouth daily. 30 capsule 5  . finasteride (PROSCAR) 5 MG tablet Take 5 mg by mouth daily.    . metoprolol (LOPRESSOR) 50 MG tablet Take 0.5 tablets (25 mg total) by  mouth 2 (two) times daily. 90 tablet 3  . pravastatin (PRAVACHOL) 20 MG tablet Take 20 mg by mouth every Monday, Wednesday, and Friday. Pt takes [name], Friday 30 tablet 11   Current Facility-Administered Medications on File Prior to Visit  Medication Dose Route Frequency Provider Last Rate Last Dose  . 0.9 %  sodium chloride infusion   Intravenous Continuous Brad Dixon, PA-C      . chlorhexidine (HIBICLENS) 4 % liquid 4 application  60 mL Topical Once Merla Riches, PA-C        Past Medical History:  Diagnosis Date  . Cancer ([place])    skin  . CKD (chronic kidney disease) 06/24/2013  . Coronary artery disease    a. s/p CABG 2000; b. myoview 9/08: inf-septal scar with mild peri-infarct ischemia (low risk);  c.  Echo 8/13: mild focal basal septal hypertrophy, Gr 1 DD, mild LAE;  d. Lexiscan Myoview (06/20/2013): No reversible ischemia, inferior and septal infarct, inferior and septal hypokinesis, EF 60%;  e.  Echo (06/20/2011): EF 50-55%, normal wall motion, grade 1 diastolic dysfunction, mild LAE  . Diverticular disease   . Duodenitis   . Elevated PSA   . GERD (gastroesophageal reflux disease)   . Gout   . Humerus fracture    right  . Hydronephrosis with ureteropelvic junction obstruction   . Hypercalcemia 02/02/12   a. 01/2012: 10.7.  Marland Kitchen Hyperlipidemia   . Hypertension   . Pancreatitis    a. ? Simvastatin related  .  Pneumonia age 80 or 63   "I think only once" (06/19/2013)  . Syncope    a. 06/2013  . TIA (transient ischemic attack)    a. 01/2012 - presentation as acute imbalance;  b. 01/2012 carotid U/S w/o signif stenosis;  c. 01/2012 Echo: EF 60-65%, Gr 1 DD.;  d.  Carotid US (06/20/2013): 1-39% bilateral ICA stenosis  . Tubular adenoma polyp of rectum   . Type II diabetes mellitus ([place])    "diet and exercise controlled at present" (06/19/2013)    Past Surgical History:  Procedure Laterality Date  . APPENDECTOMY    . CARDIAC CATHETERIZATION  2000  . CHOLECYSTECTOMY    . COCHLEAR IMPLANT  Right 1990's  . COLONOSCOPY W/ POLYPECTOMY     no polyp 2012  . CORONARY ARTERY BYPASS GRAFT  2000   CABG X5  . INGUINAL HERNIA REPAIR Right 09/15/2015   Procedure: OPEN REPAIR RIGHT INGUINAL HERNIA ;  Surgeon: Jackolyn Confer, MD;  Location: [facility];  Service: General;  Laterality: Right;  . INSERTION OF MESH Right 09/15/2015   Procedure: INSERTION OF MESH;  Surgeon: Jackolyn Confer, MD;  Location: [facility];  Service: General;  Laterality: Right;  . PARATHYROIDECTOMY  1978  . POLYPECTOMY    . REVERSE SHOULDER ARTHROPLASTY Right 12/30/2014   Procedure: Right shoulder ORIF, Biomet S3 Plate and ZImmer cable system ;  Surgeon: Netta Cedars, MD;  Location: [place];  Service: Orthopedics;  Laterality: Right;  . right ankle surgery  age 27  . TONSILLECTOMY  1943  . trachestomy as child for throat closing      Social History   Social History  . Marital status: Widowed    Spouse name: N/A  . Number of children: N/A  . Years of education: N/A   Occupational History  . retired Retired   Social History Main Topics  . Smoking status: Current Some Day Smoker    Years: 0.00    Types: Cigars  . Smokeless tobacco: Never Used     Comment: 06/19/2013 "rare cigar"  . Alcohol use Yes     Comment: 12/29/14 "might have a drink once/month"  . Drug use: No  . Sexual activity: Not Currently   Other Topics Concern  . Not on file   Social History Narrative   Does get regular exercise    Family History  Problem Relation Age of Onset  . Melanoma Father   . Heart attack Father      in 67s  . Diabetes Neg Hx     Review of Systems  Respiratory: Negative for shortness of breath.   Cardiovascular: Negative for chest pain, palpitations and leg swelling.  Neurological: Negative for dizziness, light-headedness and headaches.       Objective:   Vitals:   03/05/16 1340  BP: 126/70  Pulse: 63  Resp: 16  Temp: 97.8 F (36.6 C)   Filed Weights   03/05/16 1340  Weight: 179 lb (81.2 kg)   Body  mass index is 24.97 kg/m.   Physical Exam    Constitutional: Appears well-developed and well-nourished. No distress.  HENT:  Head: Normocephalic and atraumatic.  Neck: Neck supple. No tracheal deviation present. No thyromegaly present.  Cardiovascular: Normal rate, regular rhythm and normal heart sounds.   No murmur heard.   No carotid bruit  Pulmonary/Chest: Effort normal and breath sounds normal. No respiratory distress. No has no wheezes. No rales.  Musculoskeletal: No edema.  Lymphadenopathy: No cervical adenopathy.  Skin: Skin is warm  and dry. Not diaphoretic.  Psychiatric: Normal mood and affect. Behavior is normal.     Assessment & Plan:    See Problem List for Assessment and Plan of chronic medical problems.

## 2016-03-05 NOTE — Progress Notes (Signed)
Pre visit review using our clinic review tool, if applicable. No additional management support is needed unless otherwise documented below in the visit note. 

## 2016-06-08 ENCOUNTER — Ambulatory Visit (INDEPENDENT_AMBULATORY_CARE_PROVIDER_SITE_OTHER): Payer: Federal, State, Local not specified - PPO | Admitting: Internal Medicine

## 2016-06-08 VITALS — BP 150/70 | HR 79 | Temp 97.6°F | Resp 16 | Wt 177.0 lb

## 2016-06-08 DIAGNOSIS — J209 Acute bronchitis, unspecified: Secondary | ICD-10-CM | POA: Diagnosis not present

## 2016-06-08 MED ORDER — CEFDINIR 300 MG PO CAPS: 300.0000 mg | ORAL_CAPSULE | Freq: Two times a day (BID) | ORAL | 0 refills | Status: DC

## 2016-06-08 NOTE — Progress Notes (Signed)
Subjective:    Patient ID: Juan [name], male    DOB: March 26, 1936, 80 y.o.   MRN: [id number]  HPI He is here for an acute visit for cold symptoms.  His symptoms started 10 days ago. He has been coughing up phlegm and it is discolored - dark brown.  It does not seem to be getting better. He had a sore throat early on in his illness, but that resolved.   He denies fever.  He has not had any ear pain, sinus pain, headaches, lightheadedness, sob or wheese.  He has tried taking robitussin and hall cough drops.    Medications and allergies reviewed with patient and updated if appropriate.  Patient Active Problem List   Diagnosis Date Noted  . Shuffling gait 12/29/2015  . BPH (benign prostatic hypertrophy) with urinary obstruction 08/28/2015  . Erectile dysfunction due to arterial insufficiency 08/28/2015  . Inguinal hernia, right s/p repair with mesh 09/15/15 08/28/2015  . Prediabetes 06/29/2015  . Fracture, humerus, proximal 12/30/2014  . Dysplastic nevus 09/05/2014  . History of pancreatitis 06/25/2013  . CKD (chronic kidney disease) 06/24/2013  . Syncope 06/19/2013  . TIA (transient ischemic attack) 02/07/2012  . GERD 04/03/2010  . Essential hypertension, benign 03/02/2009  . DUODENITIS 03/01/2009  . DIVERTICULAR DISEASE 03/01/2009  . PERSONAL HX COLONIC POLYPS 03/19/2008  . History of gout 12/09/2007  . Coronary atherosclerosis 12/09/2007  . Mixed hyperlipidemia 06/04/2007    Current Outpatient Prescriptions on File Prior to Visit  Medication Sig Dispense Refill  . allopurinol (ZYLOPRIM) 100 MG tablet TAKE 1 TABLET BY MOUTH DAILY 90 tablet 1  . amLODipine (NORVASC) 2.5 MG tablet Take 1 tablet (2.5 mg total) by mouth daily. 90 tablet 3  . aspirin EC 81 MG tablet Take 81 mg by mouth daily.    . Choline Fenofibrate (FENOFIBRIC ACID) 135 MG CPDR Take 1 capsule by mouth daily. 30 capsule 5  . finasteride (PROSCAR) 5 MG tablet Take 5 mg by mouth daily.    . metoprolol  (LOPRESSOR) 50 MG tablet Take 0.5 tablets (25 mg total) by mouth 2 (two) times daily. 90 tablet 3  . pravastatin (PRAVACHOL) 20 MG tablet Take 20 mg by mouth every Monday, Wednesday, and Friday. Pt takes [name], Friday 30 tablet 11   Current Facility-Administered Medications on File Prior to Visit  Medication Dose Route Frequency Provider Last Rate Last Dose  . 0.9 %  sodium chloride infusion   Intravenous Continuous Brad Dixon, PA-C      . chlorhexidine (HIBICLENS) 4 % liquid 4 application  60 mL Topical Once Merla Riches, PA-C        Past Medical History:  Diagnosis Date  . Cancer ([place])    skin  . CKD (chronic kidney disease) 06/24/2013  . Coronary artery disease    a. s/p CABG 2000; b. myoview 9/08: inf-septal scar with mild peri-infarct ischemia (low risk);  c.  Echo 8/13: mild focal basal septal hypertrophy, Gr 1 DD, mild LAE;  d. Lexiscan Myoview (06/20/2013): No reversible ischemia, inferior and septal infarct, inferior and septal hypokinesis, EF 60%;  e.  Echo (06/20/2011): EF 50-55%, normal wall motion, grade 1 diastolic dysfunction, mild LAE  . Diverticular disease   . Duodenitis   . Elevated PSA   . GERD (gastroesophageal reflux disease)   . Gout   . Humerus fracture    right  . Hydronephrosis with ureteropelvic junction obstruction   . Hypercalcemia 02/02/12   a. 01/2012: 10.7.  Marland Kitchen  Hyperlipidemia   . Hypertension   . Pancreatitis    a. ? Simvastatin related  . Pneumonia age 56 or 70   "I think only once" (06/19/2013)  . Syncope    a. 06/2013  . TIA (transient ischemic attack)    a. 01/2012 - presentation as acute imbalance;  b. 01/2012 carotid U/S w/o signif stenosis;  c. 01/2012 Echo: EF 60-65%, Gr 1 DD.;  d.  Carotid US (06/20/2013): 1-39% bilateral ICA stenosis  . Tubular adenoma polyp of rectum   . Type II diabetes mellitus ([place])    "diet and exercise controlled at present" (06/19/2013)    Past Surgical History:  Procedure Laterality Date  . APPENDECTOMY    . CARDIAC  CATHETERIZATION  2000  . CHOLECYSTECTOMY    . COCHLEAR IMPLANT Right 1990's  . COLONOSCOPY W/ POLYPECTOMY     no polyp 2012  . CORONARY ARTERY BYPASS GRAFT  2000   CABG X5  . INGUINAL HERNIA REPAIR Right 09/15/2015   Procedure: OPEN REPAIR RIGHT INGUINAL HERNIA ;  Surgeon: Jackolyn Confer, MD;  [facility];  Service: General;  Laterality: Right;  . INSERTION OF MESH Right 09/15/2015   Procedure: INSERTION OF MESH;  Surgeon: Jackolyn Confer, MD;  Location[facility];  Service: General;  Laterality: Right;  . PARATHYROIDECTOMY  1978  . POLYPECTOMY    . REVERSE SHOULDER ARTHROPLASTY Right 12/30/2014   Procedure: Right shoulder ORIF, Biomet S3 Plate and ZImmer cable system ;  Surgeon: Netta Cedars, MD;  Location: [place];  Service: Orthopedics;  Laterality: Right;  . right ankle surgery  age 88  . TONSILLECTOMY  1943  . trachestomy as child for throat closing      Social History   Social History  . Marital status: Widowed    Spouse name: N/A  . Number of children: N/A  . Years of education: N/A   Occupational History  . retired Retired   Social History Main Topics  . Smoking status: Current Some Day Smoker    Years: 0.00    Types: Cigars  . Smokeless tobacco: Never Used     Comment: 06/19/2013 "rare cigar"  . Alcohol use Yes     Comment: 12/29/14 "might have a drink once/month"  . Drug use: No  . Sexual activity: Not Currently   Other Topics Concern  . Not on file   Social History Narrative   Does get regular exercise    Family History  Problem Relation Age of Onset  . Melanoma Father   . Heart attack Father      in 46s  . Diabetes Neg Hx     Review of Systems  Constitutional: Negative for appetite change and fever.  HENT: Negative for ear pain, sinus pressure and sore throat.   Respiratory: Positive for cough. Negative for shortness of breath and wheezing.   Gastrointestinal: Negative for diarrhea and nausea.  Neurological: Negative for light-headedness and  headaches.       Objective:   Vitals:   06/08/16 1636  BP: (!) 150/70  Pulse: 79  Resp: 16  Temp: 97.6 F (36.4 C)   Filed Weights   06/08/16 1636  Weight: 177 lb (80.3 kg)   Body mass index is 24.69 kg/m.   Physical Exam GENERAL APPEARANCE: Appears stated age, well appearing, NAD EYES: conjunctiva clear, no icterus HEENT: bilateral tympanic membranes and ear canals normal, oropharynx with mild erythema, no thyromegaly, trachea midline, no cervical or supraclavicular lymphadenopathy LUNGS: Clear to auscultation without  wheeze or crackles, unlabored breathing, good air entry bilaterally HEART: Normal S1,S2 without murmurs EXTREMITIES: Without clubbing, cyanosis, or edema        Assessment & Plan:   See Problem List for Assessment and Plan of chronic medical problems.

## 2016-06-08 NOTE — Progress Notes (Signed)
Pre visit review using our clinic review tool, if applicable. No additional management support is needed unless otherwise documented below in the visit note. 

## 2016-06-08 NOTE — Patient Instructions (Signed)
Start taking the antibiotic today.  Take as prescribed.  Call if there is no improvement.   Continue cough drops and robitussin as needed.

## 2016-06-09 ENCOUNTER — Encounter: Payer: Self-pay | Admitting: Internal Medicine

## 2016-06-09 DIAGNOSIS — J209 Acute bronchitis, unspecified: Secondary | ICD-10-CM | POA: Insufficient documentation

## 2016-06-09 NOTE — Assessment & Plan Note (Signed)
His symptoms are consistent with a bacterial bronchitis Will treat with antibiotics - omnicef Continue robitussin or cough drops as needed Call if no improvement

## 2016-06-29 ENCOUNTER — Other Ambulatory Visit: Payer: Self-pay | Admitting: Internal Medicine

## 2016-07-02 ENCOUNTER — Encounter: Payer: Self-pay | Admitting: Internal Medicine

## 2016-07-02 NOTE — Patient Instructions (Addendum)
Test(s) ordered today. Your results will be released to [place] (or called to you) after review, usually within 72hours after test completion. If any changes need to be made, you will be notified at that same time.  All other Health Maintenance issues reviewed.   All recommended immunizations and age-appropriate screenings are up-to-date or discussed.  No immunizations administered today.   Medications reviewed and updated.  No changes recommended at this time.  Your prescription(s) have been submitted to your pharmacy. Please take as directed and contact our office if you believe you are having problem(s) with the medication(s).   Please followup in 6 months    Health Maintenance, Male A healthy lifestyle and preventative care can promote health and wellness.  Maintain regular health, dental, and eye exams.  Eat a healthy diet. Foods like vegetables, fruits, whole grains, low-fat dairy products, and lean protein foods contain the nutrients you need and are low in calories. Decrease your intake of foods high in solid fats, added sugars, and salt. Get information about a proper diet from your health care provider, if necessary.  Regular physical exercise is one of the most important things you can do for your health. Most adults should get at least 150 minutes of moderate-intensity exercise (any activity that increases your heart rate and causes you to sweat) each week. In addition, most adults need muscle-strengthening exercises on 2 or more days a week.   Maintain a healthy weight. The body mass index (BMI) is a screening tool to identify possible weight problems. It provides an estimate of body fat based on height and weight. Your health care provider can find your BMI and can help you achieve or maintain a healthy weight. For males 20 years and older:  A BMI below 18.5 is considered underweight.  A BMI of 18.5 to 24.9 is normal.  A BMI of 25 to 29.9 is considered overweight.  A  BMI of 30 and above is considered obese.  Maintain normal blood lipids and cholesterol by exercising and minimizing your intake of saturated fat. Eat a balanced diet with plenty of fruits and vegetables. Blood tests for lipids and cholesterol should begin at age 37 and be repeated every 5 years. If your lipid or cholesterol levels are high, you are over age 61, or you are at high risk for heart disease, you may need your cholesterol levels checked more frequently.Ongoing high lipid and cholesterol levels should be treated with medicines if diet and exercise are not working.  If you smoke, find out from your health care provider how to quit. If you do not use tobacco, do not start.  Lung cancer screening is recommended for adults aged 53-80 years who are at high risk for developing lung cancer because of a history of smoking. A yearly low-dose CT scan of the lungs is recommended for people who have at least a 30-pack-year history of smoking and are current smokers or have quit within the past 15 years. A pack year of smoking is smoking an average of 1 pack of cigarettes a day for 1 year (for example, a 30-pack-year history of smoking could mean smoking 1 pack a day for 30 years or 2 packs a day for 15 years). Yearly screening should continue until the smoker has stopped smoking for at least 15 years. Yearly screening should be stopped for people who develop a health problem that would prevent them from having lung cancer treatment.  If you choose to drink alcohol, do not have  more than 2 drinks per day. One drink is considered to be 12 oz (360 mL) of beer, 5 oz (150 mL) of wine, or 1.5 oz (45 mL) of liquor.  Avoid the use of street drugs. Do not share needles with anyone. Ask for help if you need support or instructions about stopping the use of drugs.  High blood pressure causes heart disease and increases the risk of stroke. High blood pressure is more likely to develop in:  People who have blood  pressure in the end of the normal range (100-139/85-89 mm Hg).  People who are overweight or obese.  People who are African American.  If you are 46-47 years of age, have your blood pressure checked every 3-5 years. If you are 19 years of age or older, have your blood pressure checked every year. You should have your blood pressure measured twice-once when you are at a hospital or clinic, and once when you are not at a hospital or clinic. Record the average of the two measurements. To check your blood pressure when you are not at a hospital or clinic, you can use:  An automated blood pressure machine at a pharmacy.  A home blood pressure monitor.  If you are 71-41 years old, ask your health care provider if you should take aspirin to prevent heart disease.  Diabetes screening involves taking a blood sample to check your fasting blood sugar level. This should be done once every 3 years after age 45 if you are at a normal weight and without risk factors for diabetes. Testing should be considered at a younger age or be carried out more frequently if you are overweight and have at least 1 risk factor for diabetes.  Colorectal cancer can be detected and often prevented. Most routine colorectal cancer screening begins at the age of 54 and continues through age 4. However, your health care provider may recommend screening at an earlier age if you have risk factors for colon cancer. On a yearly basis, your health care provider may provide home test kits to check for hidden blood in the stool. A small camera at the end of a tube may be used to directly examine the colon (sigmoidoscopy or colonoscopy) to detect the earliest forms of colorectal cancer. Talk to your health care provider about this at age 79 when routine screening begins. A direct exam of the colon should be repeated every 5-10 years through age 109, unless early forms of precancerous polyps or small growths are found.  People who are at an  increased risk for hepatitis B should be screened for this virus. You are considered at high risk for hepatitis B if:  You were born in a country where hepatitis B occurs often. Talk with your health care provider about which countries are considered high risk.  Your parents were born in a high-risk country and you have not received a shot to protect against hepatitis B (hepatitis B vaccine).  You have HIV or AIDS.  You use needles to inject street drugs.  You live with, or have sex with, someone who has hepatitis B.  You are a man who has sex with other men (MSM).  You get hemodialysis treatment.  You take certain medicines for conditions like cancer, organ transplantation, and autoimmune conditions.  Hepatitis C blood testing is recommended for all people born from [birth year] through 1965 and any individual with known risk factors for hepatitis C.  Healthy men should no longer receive prostate-specific antigen (  PSA) blood tests as part of routine cancer screening. Talk to your health care provider about prostate cancer screening.  Testicular cancer screening is not recommended for adolescents or adult males who have no symptoms. Screening includes self-exam, a health care provider exam, and other screening tests. Consult with your health care provider about any symptoms you have or any concerns you have about testicular cancer.  Practice safe sex. Use condoms and avoid high-risk sexual practices to reduce the spread of sexually transmitted infections (STIs).  You should be screened for STIs, including gonorrhea and chlamydia if:  You are sexually active and are younger than 24 years.  You are older than 24 years, and your health care provider tells you that you are at risk for this type of infection.  Your sexual activity has changed since you were last screened, and you are at an increased risk for chlamydia or gonorrhea. Ask your health care provider if you are at risk.  If you are at  risk of being infected with HIV, it is recommended that you take a prescription medicine daily to prevent HIV infection. This is called pre-exposure prophylaxis (PrEP). You are considered at risk if:  You are a man who has sex with other men (MSM).  You are a heterosexual man who is sexually active with multiple partners.  You take drugs by injection.  You are sexually active with a partner who has HIV.  Talk with your health care provider about whether you are at high risk of being infected with HIV. If you choose to begin PrEP, you should first be tested for HIV. You should then be tested every 3 months for as long as you are taking PrEP.  Use sunscreen. Apply sunscreen liberally and repeatedly throughout the day. You should seek shade when your shadow is shorter than you. Protect yourself by wearing long sleeves, pants, a wide-brimmed hat, and sunglasses year round whenever you are outdoors.  Tell your health care provider of new moles or changes in moles, especially if there is a change in shape or color. Also, tell your health care provider if a mole is larger than the size of a pencil eraser.  A one-time screening for abdominal aortic aneurysm (AAA) and surgical repair of large AAAs by ultrasound is recommended for men aged 61-75 years who are current or former smokers.  Stay current with your vaccines (immunizations). This information is not intended to replace advice given to you by your health care provider. Make sure you discuss any questions you have with your health care provider. Document Released: 12/01/2007 Document Revised: 06/25/2014 Document Reviewed: 03/08/2015 Elsevier Interactive Patient Education  2017 [employer].

## 2016-07-02 NOTE — Progress Notes (Signed)
Subjective:    Patient ID: Juan [name], male    DOB: [birth year], 81 y.o.   MRN: [id number]  HPI He is here for a physical exam.   He has no concerns.  His daughter is here with him today.    He still has a residual cough from his cold last month.  The cough is dry.   Medications and allergies reviewed with patient and updated if appropriate.  Patient Active Problem List   Diagnosis Date Noted  . Acute bronchitis 06/09/2016  . Shuffling gait 12/29/2015  . BPH (benign prostatic hypertrophy) with urinary obstruction 08/28/2015  . Erectile dysfunction due to arterial insufficiency 08/28/2015  . Inguinal hernia, right s/p repair with mesh 09/15/15 08/28/2015  . Prediabetes 06/29/2015  . Fracture, humerus, proximal 12/30/2014  . Dysplastic nevus 09/05/2014  . History of pancreatitis 06/25/2013  . CKD (chronic kidney disease) 06/24/2013  . Syncope 06/19/2013  . TIA (transient ischemic attack) 02/07/2012  . GERD 04/03/2010  . Essential hypertension, benign 03/02/2009  . DUODENITIS 03/01/2009  . DIVERTICULAR DISEASE 03/01/2009  . PERSONAL HX COLONIC POLYPS 03/19/2008  . History of gout 12/09/2007  . Coronary atherosclerosis 12/09/2007  . Mixed hyperlipidemia 06/04/2007    Current Outpatient Prescriptions on File Prior to Visit  Medication Sig Dispense Refill  . allopurinol (ZYLOPRIM) 100 MG tablet TAKE 1 TABLET BY MOUTH ONCE DAILY 90 tablet 1  . amLODipine (NORVASC) 2.5 MG tablet Take 1 tablet (2.5 mg total) by mouth daily. 90 tablet 3  . aspirin EC 81 MG tablet Take 81 mg by mouth daily.    . Choline Fenofibrate (FENOFIBRIC ACID) 135 MG CPDR Take 1 capsule by mouth daily. 30 capsule 5  . finasteride (PROSCAR) 5 MG tablet Take 5 mg by mouth daily.    . metoprolol (LOPRESSOR) 50 MG tablet Take 0.5 tablets (25 mg total) by mouth 2 (two) times daily. 90 tablet 3  . pravastatin (PRAVACHOL) 20 MG tablet Take 20 mg by mouth every Monday, Wednesday, and Friday. Pt takes [name],  Friday 30 tablet 11   Current Facility-Administered Medications on File Prior to Visit  Medication Dose Route Frequency Provider Last Rate Last Dose  . chlorhexidine (HIBICLENS) 4 % liquid 4 application  60 mL Topical Once Cline Crock, [place]        Past Medical History:  Diagnosis Date  . Cancer ([place])    skin  . CKD (chronic kidney disease) 06/24/2013  . Coronary artery disease    a. s/p CABG 2000; b. myoview 9/08: inf-septal scar with mild peri-infarct ischemia (low risk);  c.  Echo 8/13: mild focal basal septal hypertrophy, Gr 1 DD, mild LAE;  d. Lexiscan Myoview (06/20/2013): No reversible ischemia, inferior and septal infarct, inferior and septal hypokinesis, EF 60%;  e.  Echo (06/20/2011): EF 50-55%, normal wall motion, grade 1 diastolic dysfunction, mild LAE  . Diverticular disease   . Duodenitis   . Elevated PSA   . GERD (gastroesophageal reflux disease)   . Gout   . Humerus fracture    right  . Hydronephrosis with ureteropelvic junction obstruction   . Hypercalcemia 02/02/12   a. 01/2012: 10.7.  Marland Kitchen Hyperlipidemia   . Hypertension   . Pancreatitis    a. ? Simvastatin related  . Pneumonia age 64 or 54   "I think only once" (06/19/2013)  . Syncope    a. 06/2013  . TIA (transient ischemic attack)    a. 01/2012 - presentation as acute imbalance;  b. 01/2012 carotid U/S w/o signif stenosis;  c. 01/2012 Echo: EF 60-65%, Gr 1 DD.;  d.  Carotid US (06/20/2013): 1-39% bilateral ICA stenosis  . Tubular adenoma polyp of rectum   . Type II diabetes mellitus ([place])    "diet and exercise controlled at present" (06/19/2013)    Past Surgical History:  Procedure Laterality Date  . APPENDECTOMY    . CARDIAC CATHETERIZATION  2000  . CHOLECYSTECTOMY    . COCHLEAR IMPLANT Right 1990's  . COLONOSCOPY W/ POLYPECTOMY     no polyp 2012  . CORONARY ARTERY BYPASS GRAFT  2000   CABG X5  . INGUINAL HERNIA REPAIR Right 09/15/2015   Procedure: OPEN REPAIR RIGHT INGUINAL HERNIA ;  Surgeon: Jackolyn Confer,  MD;  Location: [facility];  Service: General;  Laterality: Right;  . INSERTION OF MESH Right 09/15/2015   Procedure: INSERTION OF MESH;  Surgeon: Jackolyn Confer, MD;  Location: [facility];  Service: General;  Laterality: Right;  . PARATHYROIDECTOMY  1978  . POLYPECTOMY    . REVERSE SHOULDER ARTHROPLASTY Right 12/30/2014   Procedure: Right shoulder ORIF, Biomet S3 Plate and ZImmer cable system ;  Surgeon: Netta Cedars, MD;  Location: [facility];  Service: Orthopedics;  Laterality: Right;  . right ankle surgery  age 39  . TONSILLECTOMY  1943  . trachestomy as child for throat closing      Social History   Social History  . Marital status: Widowed    Spouse name: N/A  . Number of children: N/A  . Years of education: N/A   Occupational History  . retired Retired   Social History Main Topics  . Smoking status: Current Some Day Smoker    Years: 0.00    Types: Cigars  . Smokeless tobacco: Never Used     Comment: 06/19/2013 "rare cigar"  . Alcohol use Yes     Comment: 12/29/14 "might have a drink once/month"  . Drug use: No  . Sexual activity: Not Currently   Other Topics Concern  . None   Social History Narrative   Does get regular exercise    Family History  Problem Relation Age of Onset  . Melanoma Father   . Heart attack Father      in 25s  . Diabetes Neg Hx     Review of Systems  Constitutional: Negative for appetite change, fever and unexpected weight change.  HENT: Positive for hearing loss.   Eyes: Negative for visual disturbance.  Respiratory: Positive for cough (dry, residual from URI). Negative for chest tightness, shortness of breath and wheezing.   Cardiovascular: Negative for chest pain, palpitations and leg swelling.  Gastrointestinal: Negative for abdominal pain, blood in stool, constipation, diarrhea and nausea.       No GERD  Genitourinary: Negative for difficulty urinating, dysuria and hematuria.  Musculoskeletal: Positive for arthralgias (arm from old fracture).    Skin: Negative for color change and rash.  Neurological: Negative for dizziness, light-headedness, numbness and headaches.  Psychiatric/Behavioral: Negative for dysphoric mood. The patient is not nervous/anxious.        Objective:   Vitals:   07/03/16 0811  BP: 122/80  Pulse: 78  Resp: 16  Temp: 97.8 F (36.6 C)   Filed Weights   07/03/16 0811  Weight: 178 lb (80.7 kg)   Body mass index is 24.83 kg/m.  Wt Readings from Last 3 Encounters:  07/03/16 178 lb (80.7 kg)  06/08/16 177 lb (80.3 kg)  03/05/16 179 lb (81.2 kg)  Physical Exam Constitutional: He appears well-developed and well-nourished. No distress.  HENT:  Head: Normocephalic and atraumatic.  Right Ear: External ear normal.  Left Ear: External ear normal.  Mouth/Throat: Oropharynx is clear and moist.  Normal ear canals and TM b/l  Eyes: Conjunctivae and EOM are normal.  Neck: Neck supple. No tracheal deviation present. No thyromegaly present.  No carotid bruit  Cardiovascular: Normal rate, regular rhythm, normal heart sounds and intact distal pulses.   No murmur heard. Pulmonary/Chest: Effort normal and breath sounds normal. No respiratory distress. He has no wheezes. He has no rales.  Abdominal: Soft. Bowel sounds are normal. He exhibits no distension. There is no tenderness.  Genitourinary: deferred  Musculoskeletal: He exhibits no edema.  Lymphadenopathy:    He has no cervical adenopathy.  Skin: Skin is warm and dry. He is not diaphoretic.  Psychiatric: He has a normal mood and affect. His behavior is normal.         Assessment & Plan:   Physical exam: Screening blood work ordered Immunizations- discussed tdap -  Will check with insurance RE coverage Colonoscopy -  No longer needed due to age - was due 6 -  Discussed with patient and we agree he should no longer need a colonoscopy Eye exams - no - will schedule Exercise - 3 times a day - aerobics  Weight - normal BMI Skin  - sees  derm Substance abuse  --  smokes cigars   See Problem List for Assessment and Plan of chronic medical problems.   FU in 6 months

## 2016-07-03 ENCOUNTER — Other Ambulatory Visit (INDEPENDENT_AMBULATORY_CARE_PROVIDER_SITE_OTHER): Payer: Federal, State, Local not specified - PPO

## 2016-07-03 ENCOUNTER — Encounter: Payer: Self-pay | Admitting: Internal Medicine

## 2016-07-03 ENCOUNTER — Ambulatory Visit (INDEPENDENT_AMBULATORY_CARE_PROVIDER_SITE_OTHER): Payer: Federal, State, Local not specified - PPO | Admitting: Internal Medicine

## 2016-07-03 VITALS — BP 122/80 | HR 78 | Temp 97.8°F | Resp 16 | Ht 71.0 in | Wt 178.0 lb

## 2016-07-03 DIAGNOSIS — N189 Chronic kidney disease, unspecified: Secondary | ICD-10-CM

## 2016-07-03 DIAGNOSIS — N138 Other obstructive and reflux uropathy: Secondary | ICD-10-CM

## 2016-07-03 DIAGNOSIS — Z0000 Encounter for general adult medical examination without abnormal findings: Secondary | ICD-10-CM

## 2016-07-03 DIAGNOSIS — E782 Mixed hyperlipidemia: Secondary | ICD-10-CM

## 2016-07-03 DIAGNOSIS — Z8739 Personal history of other diseases of the musculoskeletal system and connective tissue: Secondary | ICD-10-CM | POA: Diagnosis not present

## 2016-07-03 DIAGNOSIS — R7303 Prediabetes: Secondary | ICD-10-CM

## 2016-07-03 DIAGNOSIS — I10 Essential (primary) hypertension: Secondary | ICD-10-CM

## 2016-07-03 DIAGNOSIS — N401 Enlarged prostate with lower urinary tract symptoms: Secondary | ICD-10-CM

## 2016-07-03 LAB — COMPREHENSIVE METABOLIC PANEL
ALBUMIN: 4.3 g/dL (ref 3.5–5.2)
ALK PHOS: 38 U/L — AB (ref 39–117)
ALT: 24 U/L (ref 0–53)
AST: 26 U/L (ref 0–37)
BILIRUBIN TOTAL: 0.6 mg/dL (ref 0.2–1.2)
BUN: 29 mg/dL — ABNORMAL HIGH (ref 6–23)
CO2: 27 mEq/L (ref 19–32)
Calcium: 10.3 mg/dL (ref 8.4–10.5)
Chloride: 106 mEq/L (ref 96–112)
Creatinine, Ser: 1.33 mg/dL (ref 0.40–1.50)
GFR: 55.21 mL/min — AB (ref >60.00)
Glucose, Bld: 136 mg/dL — ABNORMAL HIGH (ref 70–99)
POTASSIUM: 4.2 meq/L (ref 3.5–5.1)
Sodium: 140 mEq/L (ref 135–145)
Total Protein: 7.1 g/dL (ref 6.0–8.3)

## 2016-07-03 LAB — CBC WITH DIFFERENTIAL/PLATELET
Basophils Absolute: 0 10*3/uL (ref 0.0–0.1)
Basophils Relative: 0.6 % (ref 0.0–3.0)
EOS PCT: 3.7 % (ref 0.0–5.0)
Eosinophils Absolute: 0.2 10*3/uL (ref 0.0–0.7)
HEMATOCRIT: 40.5 % (ref 39.0–52.0)
Hemoglobin: 14.1 g/dL (ref 13.0–17.0)
LYMPHS ABS: 1.7 10*3/uL (ref 0.7–4.0)
LYMPHS PCT: 33.3 % (ref 12.0–46.0)
MCHC: 35 g/dL (ref 30.0–36.0)
MCV: 87.2 fl (ref 78.0–100.0)
MONOS PCT: 8.7 % (ref 3.0–12.0)
Monocytes Absolute: 0.5 10*3/uL (ref 0.1–1.0)
NEUTROS ABS: 2.8 10*3/uL (ref 1.4–7.7)
Neutrophils Relative %: 53.7 % (ref 43.0–77.0)
Platelets: 252 10*3/uL (ref 150.0–400.0)
RBC: 4.64 Mil/uL (ref 4.22–5.81)
RDW: 14.3 % (ref 11.5–15.5)
WBC: 5.2 10*3/uL (ref 4.0–10.5)

## 2016-07-03 LAB — LIPID PANEL
CHOLESTEROL: 171 mg/dL (ref 0–200)
HDL: 43.4 mg/dL (ref >39.00)
LDL Cholesterol: 102 mg/dL — ABNORMAL HIGH (ref 0–99)
NonHDL: 127.72
Total CHOL/HDL Ratio: 4
Triglycerides: 128 mg/dL (ref 0.0–149.0)
VLDL: 25.6 mg/dL (ref 0.0–40.0)

## 2016-07-03 LAB — HEMOGLOBIN A1C: Hgb A1c MFr Bld: 6.3 % (ref 4.6–6.5)

## 2016-07-03 LAB — TSH: TSH: 2.21 u[IU]/mL (ref 0.35–4.50)

## 2016-07-03 NOTE — Assessment & Plan Note (Signed)
Taking pravastatin low dose - tolerating it, did not tolerate simvastatin Check lipid panel, cmp

## 2016-07-03 NOTE — Assessment & Plan Note (Signed)
Check a1c Exercise, eating healthy

## 2016-07-03 NOTE — Assessment & Plan Note (Signed)
No gout episodes Continue allopurinol at 100 mg daily

## 2016-07-03 NOTE — Assessment & Plan Note (Signed)
BP well controlled Current regimen effective and well tolerated Continue current medications at current doses  

## 2016-07-03 NOTE — Progress Notes (Signed)
Pre visit review using our clinic review tool, if applicable. No additional management support is needed unless otherwise documented below in the visit note. 

## 2016-07-03 NOTE — Assessment & Plan Note (Signed)
Up to date with urology appts continue proscar

## 2016-07-03 NOTE — Assessment & Plan Note (Signed)
Mild Check cmp today

## 2016-07-04 ENCOUNTER — Encounter: Payer: Self-pay | Admitting: Internal Medicine

## 2016-07-06 ENCOUNTER — Encounter: Payer: Self-pay | Admitting: Emergency Medicine

## 2016-07-06 NOTE — Telephone Encounter (Signed)
Results were mailed per okay of pts daughter due to pts hearing.

## 2016-07-30 ENCOUNTER — Other Ambulatory Visit: Payer: Self-pay | Admitting: Internal Medicine

## 2016-07-30 DIAGNOSIS — I10 Essential (primary) hypertension: Secondary | ICD-10-CM

## 2016-08-01 ENCOUNTER — Other Ambulatory Visit: Payer: Self-pay | Admitting: Internal Medicine

## 2016-08-07 ENCOUNTER — Ambulatory Visit: Payer: Federal, State, Local not specified - PPO

## 2016-09-04 ENCOUNTER — Ambulatory Visit (INDEPENDENT_AMBULATORY_CARE_PROVIDER_SITE_OTHER): Payer: Federal, State, Local not specified - PPO | Admitting: General Practice

## 2016-09-04 DIAGNOSIS — Z23 Encounter for immunization: Secondary | ICD-10-CM

## 2016-09-04 NOTE — Progress Notes (Signed)
Injection given.   Stacy J Burns, MD  

## 2016-10-02 ENCOUNTER — Other Ambulatory Visit: Payer: Self-pay | Admitting: Cardiovascular Disease

## 2016-10-31 ENCOUNTER — Other Ambulatory Visit: Payer: Self-pay | Admitting: Internal Medicine

## 2016-10-31 DIAGNOSIS — I10 Essential (primary) hypertension: Secondary | ICD-10-CM

## 2016-12-09 ENCOUNTER — Other Ambulatory Visit: Payer: Self-pay | Admitting: Cardiovascular Disease

## 2016-12-20 ENCOUNTER — Other Ambulatory Visit: Payer: Self-pay | Admitting: Internal Medicine

## 2017-01-01 ENCOUNTER — Other Ambulatory Visit (INDEPENDENT_AMBULATORY_CARE_PROVIDER_SITE_OTHER): Payer: Federal, State, Local not specified - PPO

## 2017-01-01 ENCOUNTER — Encounter: Payer: Self-pay | Admitting: Internal Medicine

## 2017-01-01 ENCOUNTER — Ambulatory Visit (INDEPENDENT_AMBULATORY_CARE_PROVIDER_SITE_OTHER): Payer: Federal, State, Local not specified - PPO | Admitting: Internal Medicine

## 2017-01-01 VITALS — BP 118/68 | HR 76 | Temp 97.6°F | Resp 16 | Wt 180.0 lb

## 2017-01-01 DIAGNOSIS — Z8739 Personal history of other diseases of the musculoskeletal system and connective tissue: Secondary | ICD-10-CM

## 2017-01-01 DIAGNOSIS — E782 Mixed hyperlipidemia: Secondary | ICD-10-CM

## 2017-01-01 DIAGNOSIS — I2510 Atherosclerotic heart disease of native coronary artery without angina pectoris: Secondary | ICD-10-CM

## 2017-01-01 DIAGNOSIS — I10 Essential (primary) hypertension: Secondary | ICD-10-CM

## 2017-01-01 DIAGNOSIS — R7303 Prediabetes: Secondary | ICD-10-CM | POA: Diagnosis not present

## 2017-01-01 LAB — COMPREHENSIVE METABOLIC PANEL
ALT: 15 U/L (ref 0–53)
AST: 19 U/L (ref 0–37)
Albumin: 4.1 g/dL (ref 3.5–5.2)
Alkaline Phosphatase: 39 U/L (ref 39–117)
BUN: 31 mg/dL — AB (ref 6–23)
CHLORIDE: 107 meq/L (ref 96–112)
CO2: 28 meq/L (ref 19–32)
CREATININE: 1.32 mg/dL (ref 0.40–1.50)
Calcium: 10.5 mg/dL (ref 8.4–10.5)
GFR: 55.62 mL/min — ABNORMAL LOW (ref >60.00)
Glucose, Bld: 147 mg/dL — ABNORMAL HIGH (ref 70–99)
POTASSIUM: 4.1 meq/L (ref 3.5–5.1)
SODIUM: 141 meq/L (ref 135–145)
Total Bilirubin: 0.6 mg/dL (ref 0.2–1.2)
Total Protein: 6.7 g/dL (ref 6.0–8.3)

## 2017-01-01 LAB — URIC ACID: Uric Acid, Serum: 6.9 mg/dL (ref 4.0–7.8)

## 2017-01-01 LAB — HEMOGLOBIN A1C: HEMOGLOBIN A1C: 6.4 % (ref 4.6–6.5)

## 2017-01-01 NOTE — Assessment & Plan Note (Signed)
BP well controlled Current regimen effective and well tolerated Continue current medications at current doses cmp  

## 2017-01-01 NOTE — Assessment & Plan Note (Signed)
Lipids have been well controlled Continue daily statin Regular exercise and healthy diet encouraged  

## 2017-01-01 NOTE — Assessment & Plan Note (Signed)
No concerning symptoms Continue asa, statin, BB Continue regular exercise, healthy diet

## 2017-01-01 NOTE — Assessment & Plan Note (Signed)
Check a1c Sugars well controlled at home Low sugar / carb diet Continue regular exercise

## 2017-01-01 NOTE — Assessment & Plan Note (Signed)
Had one mild flair - improved with increased water intake Check uric acid level Continue allopurinol

## 2017-01-01 NOTE — Patient Instructions (Addendum)
  Test(s) ordered today. Your results will be released to MyChart (or called to you) after review, usually within 72hours after test completion. If any changes need to be made, you will be notified at that same time.  Medications reviewed and updated.  No changes recommended at this time.    Please followup in 6 months   

## 2017-01-01 NOTE — Progress Notes (Signed)
Subjective:    Patient ID: Juan [name], male    DOB: [birth year], 81 y.o.   MRN: [id number]  HPI The patient is here for follow up.  CAD, Hypertension: He is taking his medication daily. He is compliant with a low sodium diet.  He denies chest pain, palpitations, edema, shortness of breath and regular headaches. He is exercising regularly.  He does monitor his blood pressure at home - 125/72-140/73.    Hyperlipidemia: He is taking his medication daily. He is compliant with a low fat/cholesterol diet. He is exercising regularly. He denies myalgias.   Prediabetes:  He is compliant with a low sugar/carbohydrate diet.  He is exercising regularly. He checks his sugars 97-140  History of gout:  He is taking allopurinol daily. He had some mild gout since he was here last and thinks it is from not drinking enough water -- he increased his water and it went away.     Medications and allergies reviewed with patient and updated if appropriate.  Patient Active Problem List   Diagnosis Date Noted  . Shuffling gait 12/29/2015  . Benign prostatic hyperplasia with urinary obstruction 08/28/2015  . Erectile dysfunction due to arterial insufficiency 08/28/2015  . Inguinal hernia, right s/p repair with mesh 09/15/15 08/28/2015  . Prediabetes 06/29/2015  . Fracture, humerus, proximal 12/30/2014  . Dysplastic nevus 09/05/2014  . History of pancreatitis 06/25/2013  . CKD (chronic kidney disease) 06/24/2013  . Syncope 06/19/2013  . TIA (transient ischemic attack) 02/07/2012  . GERD 04/03/2010  . Essential hypertension, benign 03/02/2009  . DUODENITIS 03/01/2009  . DIVERTICULAR DISEASE 03/01/2009  . PERSONAL HX COLONIC POLYPS 03/19/2008  . History of gout 12/09/2007  . Coronary atherosclerosis 12/09/2007  . Mixed hyperlipidemia 06/04/2007    Current Outpatient Prescriptions on File Prior to Visit  Medication Sig Dispense Refill  . allopurinol (ZYLOPRIM) 100 MG tablet TAKE 1 TABLET BY MOUTH ONCE  DAILY 90 tablet 1  . amLODipine (NORVASC) 2.5 MG tablet TAKE 1 TABLET(2.5 MG) BY MOUTH DAILY 90 tablet 2  . aspirin EC 81 MG tablet Take 81 mg by mouth daily.    . Choline Fenofibrate (FENOFIBRIC ACID) 135 MG CPDR TAKE 1 CAPSULE BY MOUTH DAILY 30 capsule 5  . finasteride (PROSCAR) 5 MG tablet Take 5 mg by mouth daily.    . metoprolol tartrate (LOPRESSOR) 50 MG tablet TAKE 1/2 TABLET BY MOUTH TWICE DAILY 90 tablet 2  . pravastatin (PRAVACHOL) 20 MG tablet Take 1 tablet (20 mg total) by mouth daily. 30 tablet 2   No current facility-administered medications on file prior to visit.     Past Medical History:  Diagnosis Date  . Cancer ([place])    skin  . CKD (chronic kidney disease) 06/24/2013  . Coronary artery disease    a. s/p CABG 2000; b. myoview 9/08: inf-septal scar with mild peri-infarct ischemia (low risk);  c.  Echo 8/13: mild focal basal septal hypertrophy, Gr 1 DD, mild LAE;  d. Lexiscan Myoview (06/20/2013): No reversible ischemia, inferior and septal infarct, inferior and septal hypokinesis, EF 60%;  e.  Echo (06/20/2011): EF 50-55%, normal wall motion, grade 1 diastolic dysfunction, mild LAE  . Diverticular disease   . Duodenitis   . Elevated PSA   . GERD (gastroesophageal reflux disease)   . Gout   . Humerus fracture    right  . Hydronephrosis with ureteropelvic junction obstruction   . Hypercalcemia 02/02/12   a. 01/2012: 10.7.  Marland Kitchen Hyperlipidemia   .  Hypertension   . Pancreatitis    a. ? Simvastatin related  . Pneumonia age 79 or 74   "I think only once" (06/19/2013)  . Syncope    a. 06/2013  . TIA (transient ischemic attack)    a. 01/2012 - presentation as acute imbalance;  b. 01/2012 carotid U/S w/o signif stenosis;  c. 01/2012 Echo: EF 60-65%, Gr 1 DD.;  d.  Carotid US (06/20/2013): 1-39% bilateral ICA stenosis  . Tubular adenoma polyp of rectum   . Type II diabetes mellitus ([place])    "diet and exercise controlled at present" (06/19/2013)    Past Surgical History:  Procedure  Laterality Date  . APPENDECTOMY    . CARDIAC CATHETERIZATION  2000  . CHOLECYSTECTOMY    . COCHLEAR IMPLANT Right 1990's  . COLONOSCOPY W/ POLYPECTOMY     no polyp 2012  . CORONARY ARTERY BYPASS GRAFT  2000   CABG X5  . INGUINAL HERNIA REPAIR Right 09/15/2015   Procedure: OPEN REPAIR RIGHT INGUINAL HERNIA ;  Surgeon: Jackolyn Confer, MD;  Location[facility];  Service: General;  Laterality: Right;  . INSERTION OF MESH Right 09/15/2015   Procedure: INSERTION OF MESH;  Surgeon: Jackolyn Confer, MD;  Location[facility];  Service: General;  Laterality: Right;  . PARATHYROIDECTOMY  1978  . POLYPECTOMY    . REVERSE SHOULDER ARTHROPLASTY Right 12/30/2014   Procedure: Right shoulder ORIF, Biomet S3 Plate and ZImmer cable system ;  Surgeon: Netta Cedars, MD;  Location: [facility];  Service: Orthopedics;  Laterality: Right;  . right ankle surgery  age 39  . TONSILLECTOMY  1943  . trachestomy as child for throat closing      Social History   Social History  . Marital status: Widowed    Spouse name: N/A  . Number of children: N/A  . Years of education: N/A   Occupational History  . retired Retired   Social History Main Topics  . Smoking status: Current Some Day Smoker    Years: 0.00    Types: Cigars  . Smokeless tobacco: Never Used     Comment: 06/19/2013 "rare cigar"  . Alcohol use Yes     Comment: 12/29/14 "might have a drink once/month"  . Drug use: No  . Sexual activity: Not Currently   Other Topics Concern  . None   Social History Narrative   Does get regular exercise    Family History  Problem Relation Age of Onset  . Melanoma Father   . Heart attack Father         in 80s  . Diabetes Neg Hx     Review of Systems  Constitutional: Negative for chills and fever.  Respiratory: Negative for cough, shortness of breath and wheezing.   Cardiovascular: Negative for chest pain, palpitations and leg swelling.  Neurological: Positive for light-headedness (rare). Negative for headaches.        Objective:   Vitals:   01/01/17 1040  BP: 118/68  Pulse: 76  Resp: 16  Temp: 97.6 F (36.4 C)   Wt Readings from Last 3 Encounters:  01/01/17 180 lb (81.6 kg)  07/03/16 178 lb (80.7 kg)  06/08/16 177 lb (80.3 kg)   Body mass index is 25.1 kg/m.   Physical Exam    Constitutional: Appears well-developed and well-nourished. No distress.  HENT:  Head: Normocephalic and atraumatic.  Neck: Neck supple. No tracheal deviation present. No thyromegaly present.  No cervical lymphadenopathy Cardiovascular: Normal rate, regular rhythm and normal heart sounds.  No murmur heard. No carotid bruit .  No edema Pulmonary/Chest: Effort normal and breath sounds normal. No respiratory distress. No has no wheezes. No rales.  Skin: Skin is warm and dry. Not diaphoretic.  Psychiatric: Normal mood and affect. Behavior is normal.      Assessment & Plan:    See Problem List for Assessment and Plan of chronic medical problems.    FU in 6 months

## 2017-01-02 ENCOUNTER — Encounter: Payer: Self-pay | Admitting: Internal Medicine

## 2017-01-03 ENCOUNTER — Telehealth: Payer: Self-pay | Admitting: Internal Medicine

## 2017-01-03 ENCOUNTER — Other Ambulatory Visit: Payer: Self-pay | Admitting: Internal Medicine

## 2017-01-03 NOTE — Telephone Encounter (Signed)
Pt daughter called in and said that in will cover the "ONE TOUCH' meter.  She would like a new meter sent in and strips   Pharmacy - [facility] on file

## 2017-01-04 MED ORDER — BLOOD GLUCOSE MONITOR KIT: PACK | 0 refills | Status: DC

## 2017-01-04 NOTE — Telephone Encounter (Signed)
RX faxed to [facility] 

## 2017-01-30 ENCOUNTER — Other Ambulatory Visit: Payer: Self-pay | Admitting: Internal Medicine

## 2017-02-03 NOTE — Progress Notes (Signed)
Patient ID: Juan [name], male   DOB: 20-Nov-1935, 81 y.o.   MRN: [id number]   Juan [name] is a 81 y.o.  male with a hx of CAD, s/p CABG in 2000, HTN, HL, deafness s/p cochlear implant. Last myoview 9/08: inf-septal scar with mild peri-infarct ischemia (low risk).   Patient was admitted to the hospital 79/2-79/3/15  with syncope. His syncopal episode occurred while lifting weights. Syncopal episode was accompanied by nausea and vomiting. Head CT demonstrated no acute findings. Carotid US (06/20/2013): 1-39% bilateral ICA stenosis. Echo (06/20/2011): EF 50-55%, normal wall motion, grade 1 diastolic dysfunction, mild LAE. He was evaluated by [place]  Symptoms sound more vasovagal than cardiac in nature. Lexiscan Myoview (06/20/2013): No reversible ischemia, inferior and septal infarct, inferior and septal hypokinesis, EF 60%. Outpatient event monitor was recommended.   01/16/15 fell on stairs and had right displaced humeral fracture surgically repaired by Dr Veverly Fells Still with poor ROM  Wife died 5 years ago Has two daughters that look after him He has a place in [place] but has not been in a while    ROS: Denies fever, malais, weight loss, blurry vision, decreased visual acuity, cough, sputum, SOB, hemoptysis, pleuritic pain, palpitaitons, heartburn, abdominal pain, melena, lower extremity edema, claudication, or rash.  All other systems reviewed and negative  General: Affect appropriate Healthy:  appears stated age 81: choclear implant  Neck supple with no adenopathy JVP normal no bruits no thyromegaly Lungs clear with no wheezing and good diaphragmatic motion Heart:  S1/S2 SEM  murmur, no rub, gallop or click PMI normal Abdomen: benighn, BS positve, no tenderness, no AAA no bruit.  No HSM or HJR Distal pulses intact with no bruits No edema Neuro non-focal Skin warm and dry Frozen right shoulder post humeral fracture    Current Outpatient Prescriptions  Medication Sig Dispense Refill  .  allopurinol (ZYLOPRIM) 100 MG tablet TAKE 1 TABLET BY MOUTH ONCE DAILY 90 tablet 1  . amLODipine (NORVASC) 2.5 MG tablet TAKE 1 TABLET(2.5 MG) BY MOUTH DAILY 90 tablet 2  . aspirin EC 81 MG tablet Take 81 mg by mouth daily.    . blood glucose meter kit and supplies KIT Dispense based on patient and insurance preference. Use up to four times daily as directed. (FOR ICD-9 250.00, 250.01). 1 each 0  . Choline Fenofibrate (FENOFIBRIC ACID) 135 MG CPDR TAKE 1 CAPSULE BY MOUTH DAILY 30 capsule 4  . finasteride (PROSCAR) 5 MG tablet Take 5 mg by mouth daily.    . metoprolol tartrate (LOPRESSOR) 50 MG tablet TAKE 1/2 TABLET BY MOUTH TWICE DAILY 90 tablet 2  . pravastatin (PRAVACHOL) 20 MG tablet Take 1 tablet (20 mg total) by mouth daily. 30 tablet 2   No current facility-administered medications for this visit.     Allergies  Simvastatin  Electrocardiogram:  SR rate 61 nonspecific St T wave changes 08/25/13  SR rate 68 low voltage nonspecific ST changes no difference 06/29/14 01/19/16  SR rate 62 septal infarct no acute changes  02/05/17 SB rate 57 nonspecific ST/T changes  Assessment and Plan  CAD/CABG:  Distant 2000 no angina continue medical Rx Myovue 1/15 with old IMI no ischemia EF normal  Syncope:  Non cardiac carotids plaque no stenosis  Chol:   Cholesterol is at goal.  Continue current dose of statin and diet Rx.  No myalgias or side effects.  F/U  Labs with Dr Thom Chimes in January told him to be fasting for that appt Lab  Results  Component Value Date   LDLCALC 102 (H) 07/03/2016            Prostate:  F/U PSA Dr Braulio Bosch continue Proscar Ortho:  Needs more PT for frozen left shoulder post surgery/trauma f/u Dr Dierdre Harness

## 2017-02-05 ENCOUNTER — Ambulatory Visit (INDEPENDENT_AMBULATORY_CARE_PROVIDER_SITE_OTHER): Payer: Federal, State, Local not specified - PPO | Admitting: Cardiovascular Disease

## 2017-02-05 ENCOUNTER — Encounter: Payer: Self-pay | Admitting: Cardiovascular Disease

## 2017-02-05 VITALS — BP 120/84 | HR 57 | Ht 71.0 in | Wt 180.8 lb

## 2017-02-05 DIAGNOSIS — I2510 Atherosclerotic heart disease of native coronary artery without angina pectoris: Secondary | ICD-10-CM

## 2017-02-05 NOTE — Patient Instructions (Addendum)

## 2017-03-18 ENCOUNTER — Ambulatory Visit (INDEPENDENT_AMBULATORY_CARE_PROVIDER_SITE_OTHER): Payer: Federal, State, Local not specified - PPO | Admitting: Internal Medicine

## 2017-03-18 ENCOUNTER — Encounter: Payer: Self-pay | Admitting: Internal Medicine

## 2017-03-18 VITALS — BP 128/70 | HR 64 | Temp 98.1°F | Resp 16 | Wt 182.0 lb

## 2017-03-18 DIAGNOSIS — S6992XA Unspecified injury of left wrist, hand and finger(s), initial encounter: Secondary | ICD-10-CM

## 2017-03-18 DIAGNOSIS — Z23 Encounter for immunization: Secondary | ICD-10-CM

## 2017-03-18 NOTE — Assessment & Plan Note (Signed)
Liked torn tendon Unable to extend left index DIP joint, distal finger is flexed Will refer to hand ortho for further evaluation

## 2017-03-18 NOTE — Patient Instructions (Signed)
A referral was ordered for a hand orthopedic   Flu immunization administered today.

## 2017-03-18 NOTE — Progress Notes (Signed)
Subjective:    Patient ID: Juan [name], male    DOB: [birth year], 81 y.o.   MRN: [id number]  HPI He is here for an acute visit.   Left index finger:  Two days ago he was trying to pull his sock up and he heard pop in his left index finger.  Since then he has not been able to flex his DIP joint in his left index finger.  He denies pain, numbness/tingling.  There is no swelling or redness.      Medications and allergies reviewed with patient and updated if appropriate.  Patient Active Problem List   Diagnosis Date Noted  . Shuffling gait 12/29/2015  . Benign prostatic hyperplasia with urinary obstruction 08/28/2015  . Erectile dysfunction due to arterial insufficiency 08/28/2015  . Inguinal hernia, right s/p repair with mesh 09/15/15 08/28/2015  . Prediabetes 06/29/2015  . Fracture, humerus, proximal 12/30/2014  . Dysplastic nevus 09/05/2014  . History of pancreatitis 06/25/2013  . CKD (chronic kidney disease) 06/24/2013  . Syncope 06/19/2013  . TIA (transient ischemic attack) 02/07/2012  . GERD 04/03/2010  . Essential hypertension, benign 03/02/2009  . DUODENITIS 03/01/2009  . DIVERTICULAR DISEASE 03/01/2009  . PERSONAL HX COLONIC POLYPS 03/19/2008  . History of gout 12/09/2007  . Coronary atherosclerosis 12/09/2007  . Mixed hyperlipidemia 06/04/2007    Current Outpatient Prescriptions on File Prior to Visit  Medication Sig Dispense Refill  . allopurinol (ZYLOPRIM) 100 MG tablet TAKE 1 TABLET BY MOUTH ONCE DAILY 90 tablet 1  . amLODipine (NORVASC) 2.5 MG tablet TAKE 1 TABLET(2.5 MG) BY MOUTH DAILY 90 tablet 2  . aspirin EC 81 MG tablet Take 81 mg by mouth daily.    . blood glucose meter kit and supplies KIT Dispense based on patient and insurance preference. Use up to four times daily as directed. (FOR ICD-9 250.00, 250.01). 1 each 0  . Choline Fenofibrate (FENOFIBRIC ACID) 135 MG CPDR TAKE 1 CAPSULE BY MOUTH DAILY 30 capsule 4  . finasteride (PROSCAR) 5 MG tablet Take 5  mg by mouth daily.    . metoprolol tartrate (LOPRESSOR) 50 MG tablet TAKE 1/2 TABLET BY MOUTH TWICE DAILY 90 tablet 2  . pravastatin (PRAVACHOL) 20 MG tablet Take 1 tablet (20 mg total) by mouth daily. (Patient taking differently: Take 20 mg by mouth daily. Take 1 tablet by mouth Mon, Wed, Friday) 30 tablet 2   No current facility-administered medications on file prior to visit.     Past Medical History:  Diagnosis Date  . Cancer ([place])    skin  . CKD (chronic kidney disease) 06/24/2013  . Coronary artery disease    a. s/p CABG 2000; b. myoview 9/08: inf-septal scar with mild peri-infarct ischemia (low risk);  c.  Echo 8/13: mild focal basal septal hypertrophy, Gr 1 DD, mild LAE;  d. Lexiscan Myoview (06/20/2013): No reversible ischemia, inferior and septal infarct, inferior and septal hypokinesis, EF 60%;  e.  Echo (06/20/2011): EF 50-55%, normal wall motion, grade 1 diastolic dysfunction, mild LAE  . Diverticular disease   . Duodenitis   . Elevated PSA   . GERD (gastroesophageal reflux disease)   . Gout   . Humerus fracture    right  . Hydronephrosis with ureteropelvic junction obstruction   . Hypercalcemia 02/02/12   a. 01/2012: 10.7.  Marland Kitchen Hyperlipidemia   . Hypertension   . Pancreatitis    a. ? Simvastatin related  . Pneumonia age 76 or 89   "I think only  once" (06/19/2013)  . Syncope    a. 06/2013  . TIA (transient ischemic attack)    a. 01/2012 - presentation as acute imbalance;  b. 01/2012 carotid U/S w/o signif stenosis;  c. 01/2012 Echo: EF 60-65%, Gr 1 DD.;  d.  Carotid US (06/20/2013): 1-39% bilateral ICA stenosis  . Tubular adenoma polyp of rectum   . Type II diabetes mellitus ([place])    "diet and exercise controlled at present" (06/19/2013)    Past Surgical History:  Procedure Laterality Date  . APPENDECTOMY    . CARDIAC CATHETERIZATION  2000  . CHOLECYSTECTOMY    . COCHLEAR IMPLANT Right 1990's  . COLONOSCOPY W/ POLYPECTOMY     no polyp 2012  . CORONARY ARTERY BYPASS GRAFT  2000     CABG X5  . INGUINAL HERNIA REPAIR Right 09/15/2015   Procedure: OPEN REPAIR RIGHT INGUINAL HERNIA ;  Surgeon: Jackolyn Confer, MD;  [facility];  Service: General;  Laterality: Right;  . INSERTION OF MESH Right 09/15/2015   Procedure: INSERTION OF MESH;  Surgeon: Jackolyn Confer, MD;  [facility];  Service: General;  Laterality: Right;  . PARATHYROIDECTOMY  1978  . POLYPECTOMY    . REVERSE SHOULDER ARTHROPLASTY Right 12/30/2014   Procedure: Right shoulder ORIF, Biomet S3 Plate and ZImmer cable system ;  Surgeon: Netta Cedars, MD;  Location: [facility];  Service: Orthopedics;  Laterality: Right;  . right ankle surgery  age 18  . TONSILLECTOMY  1943  . trachestomy as child for throat closing      Social History   Social History  . Marital status: Widowed    Spouse name: N/A  . Number of children: N/A  . Years of education: N/A   Occupational History  . retired Retired   Social History Main Topics  . Smoking status: Current Some Day Smoker    Years: 0.00    Types: Cigars  . Smokeless tobacco: Never Used     Comment: 06/19/2013 "rare cigar"  . Alcohol use Yes     Comment: 12/29/14 "might have a drink once/month"  . Drug use: No  . Sexual activity: Not Currently   Other Topics Concern  . None   Social History Narrative   Does get regular exercise    Family History  Problem Relation Age of Onset  . Melanoma Father   . Heart attack Father         in 70s  . Diabetes Neg Hx     Review of Systems  Constitutional: Negative for chills and fever.  Musculoskeletal:       No pain, swelling in left index finger  Skin: Negative for color change.  Neurological: Positive for weakness. Negative for numbness.       Objective:   Vitals:   03/18/17 1435  BP: 128/70  Pulse: 64  Resp: 16  Temp: 98.1 F (36.7 C)  SpO2: 97%   Filed Weights   03/18/17 1435  Weight: 182 lb (82.6 kg)   Body mass index is 25.38 kg/m.  Wt Readings from Last 3 Encounters:  03/18/17 182  lb (82.6 kg)  02/05/17 180 lb 12.8 oz (82 kg)  01/01/17 180 lb (81.6 kg)     Physical Exam  Constitutional: He appears well-developed and well-nourished. No distress.  Musculoskeletal:  Left index DIP joint flexed - he is unable to extend, mild pain just proximal to DIP and at DIP joint. No swelling  Neurological:  Left UE neurovascularly intact  Skin: Skin  is warm and dry. He is not diaphoretic. No erythema.          Assessment & Plan:   See Problem List for Assessment and Plan of chronic medical problems.

## 2017-03-25 ENCOUNTER — Ambulatory Visit (INDEPENDENT_AMBULATORY_CARE_PROVIDER_SITE_OTHER): Payer: Federal, State, Local not specified - PPO

## 2017-03-25 ENCOUNTER — Ambulatory Visit (INDEPENDENT_AMBULATORY_CARE_PROVIDER_SITE_OTHER): Payer: Federal, State, Local not specified - PPO | Admitting: Orthopaedic Surgery

## 2017-03-25 ENCOUNTER — Encounter (INDEPENDENT_AMBULATORY_CARE_PROVIDER_SITE_OTHER): Payer: Self-pay | Admitting: Orthopaedic Surgery

## 2017-03-25 DIAGNOSIS — M20012 Mallet finger of left finger(s): Secondary | ICD-10-CM

## 2017-03-25 NOTE — Progress Notes (Signed)
Office Visit Note   Patient: Juan [name]           Date of Birth: Aug 02, 1935           MRN: [id number] Visit Date: 03/25/2017              Requested by: Binnie Rail, MD [place], [place] [address] PCP: Binnie Rail, MD   Assessment & Plan: Visit Diagnoses:  1. Mallet deformity of left middle finger     Plan: Patient has left index mallet finger. Recommend extension splinting for 8 weeks. Referral to hand therapy active [facility] orthopedics for splint. Follow-up in 2 weeks for recheck. questions encouraged and answered.  Follow-Up Instructions: Return in about 2 weeks (around 04/08/2017).   Orders:  Orders Placed This Encounter  Procedures  . XR Finger Index Left   No orders of the defined types were placed in this encounter.     Procedures: No procedures performed   Clinical Data: No additional findings.   Subjective: Chief Complaint  Patient presents with  . Left Index Finger - Pain    Patient is a 81 year old gentleman who sustained an acute injury to his left index finger while taking off his sock. He denies any injuries. Denies any significant pain. Denies any numbness tingling. He has inability to extend his DIP joint    Review of Systems  Constitutional: Negative.   All other systems reviewed and are negative.    Objective: Vital Signs: There were no vitals taken for this visit.  Physical Exam  Constitutional: He is oriented to person, place, and time. He appears well-developed and well-nourished.  HENT:  Head: Normocephalic and atraumatic.  Eyes: Pupils are equal, round, and reactive to light.  Neck: Neck supple.  Pulmonary/Chest: Effort normal.  Abdominal: Soft.  Musculoskeletal: Normal range of motion.  Neurological: He is alert and oriented to person, place, and time.  Skin: Skin is warm.  Psychiatric: He has a normal mood and affect. His behavior is normal. Judgment and thought content normal.  Nursing note and vitals  reviewed.   Ortho Exam Left index finger exam shows normal flexor tendon function. Central slip is intact. Exam is consistent with mallet finger. Specialty Comments:  No specialty comments available.  Imaging: Xr Finger Index Left  Result Date: 03/25/2017 No acute or structural bony abnormalities    PMFS History: Patient Active Problem List   Diagnosis Date Noted  . 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 Finger injury, left, initial encounter 03/18/2017  . Shuffling gait 12/29/2015  . Benign prostatic hyperplasia with urinary obstruction 08/28/2015  . Erectile dysfunction due to arterial insufficiency 08/28/2015  . Inguinal hernia, right s/p repair with mesh 09/15/15 08/28/2015  . Prediabetes 06/29/2015  . Fracture, humerus, proximal 12/30/2014  . Dysplastic nevus 09/05/2014  . History of pancreatitis 06/25/2013  . CKD (chronic kidney disease) 06/24/2013  . Syncope 06/19/2013  . TIA (transient ischemic attack) 02/07/2012  . GERD 04/03/2010  . Essential hypertension, benign 03/02/2009  . DUODENITIS 03/01/2009  . DIVERTICULAR DISEASE 03/01/2009  . PERSONAL HX COLONIC POLYPS 03/19/2008  . History of gout 12/09/2007  . Coronary atherosclerosis 12/09/2007  . Mixed hyperlipidemia 06/04/2007   Past Medical History:  Diagnosis Date  . Cancer ([place])    skin  . CKD (chronic kidney disease) 06/24/2013  . Coronary artery disease    a. s/p CABG 2000; b. myoview 9/08: inf-septal scar with mild peri-infarct ischemia (low risk);  c.  Echo 8/13: mild focal basal septal hypertrophy, Gr 1 DD, mild LAE;  d. Lexiscan Myoview (06/20/2013): No reversible ischemia, inferior and septal infarct, inferior and septal hypokinesis, EF 60%;  e.  Echo (06/20/2011): EF 50-55%, normal wall motion, grade 1 diastolic dysfunction, mild LAE  . Diverticular disease   . Duodenitis   . Elevated PSA   . GERD (gastroesophageal reflux disease)   . Gout   . Humerus fracture    right  . Hydronephrosis  with ureteropelvic junction obstruction   . Hypercalcemia 02/02/12   a. 01/2012: 10.7.  Marland Kitchen Hyperlipidemia   . Hypertension   . Pancreatitis    a. ? Simvastatin related  . Pneumonia age 50 or 54   "I think only once" (06/19/2013)  . Syncope    a. 06/2013  . TIA (transient ischemic attack)    a. 01/2012 - presentation as acute imbalance;  b. 01/2012 carotid U/S w/o signif stenosis;  c. 01/2012 Echo: EF 60-65%, Gr 1 DD.;  d.  Carotid US (06/20/2013): 1-39% bilateral ICA stenosis  . Tubular adenoma polyp of rectum   . Type II diabetes mellitus ([place])    "diet and exercise controlled at present" (06/19/2013)    Family History  Problem Relation Age of Onset  . Melanoma Father   . Heart attack Father         in 35s  . Diabetes Neg Hx     Past Surgical History:  Procedure Laterality Date  . APPENDECTOMY    . CARDIAC CATHETERIZATION  2000  . CHOLECYSTECTOMY    . COCHLEAR IMPLANT Right 1990's  . COLONOSCOPY W/ POLYPECTOMY     no polyp 2012  . CORONARY ARTERY BYPASS GRAFT  2000   CABG X5  . INGUINAL HERNIA REPAIR Right 09/15/2015   Procedure: OPEN REPAIR RIGHT INGUINAL HERNIA ;  Surgeon: Jackolyn Confer, MD;  [facility];  Service: General;  Laterality: Right;  . INSERTION OF MESH Right 09/15/2015   Procedure: INSERTION OF MESH;  Surgeon: Jackolyn Confer, MD;  [facility];  Service: General;  Laterality: Right;  . PARATHYROIDECTOMY  1978  . POLYPECTOMY    . REVERSE SHOULDER ARTHROPLASTY Right 12/30/2014   Procedure: Right shoulder ORIF, Biomet S3 Plate and ZImmer cable system ;  Surgeon: Netta Cedars, MD;  Location: [facility];  Service: Orthopedics;  Laterality: Right;  . right ankle surgery  age 32  . TONSILLECTOMY  1943  . trachestomy as child for throat closing     Social History   Occupational History  . retired Retired   Social History Main Topics  . Smoking status: Current Some Day Smoker    Years: 0.00    Types: Cigars  . Smokeless tobacco: Never Used     Comment: 06/19/2013 "rare  cigar"  . Alcohol use Yes     Comment: 12/29/14 "might have a drink once/month"  . Drug use: No  . Sexual activity: Not Currently

## 2017-04-05 ENCOUNTER — Ambulatory Visit (INDEPENDENT_AMBULATORY_CARE_PROVIDER_SITE_OTHER): Payer: Federal, State, Local not specified - PPO | Admitting: Orthopaedic Surgery

## 2017-04-05 DIAGNOSIS — M20012 Mallet finger of left finger(s): Secondary | ICD-10-CM | POA: Diagnosis not present

## 2017-04-05 NOTE — Progress Notes (Signed)
Office Visit Note   Patient: Juan [name]           Date of Birth: May 21, 1936           MRN: [id number] Visit Date: 04/05/2017              Requested by: Binnie Rail, MD [place], [place] [address] PCP: Binnie Rail, MD   Assessment & Plan: Visit Diagnoses:  1. Mallet deformity of left middle finger     Plan: Continue extension splinting for 6 more weeks.  Follow-up at that time for recheck.  Follow-Up Instructions: Return in about 6 weeks (around 05/17/2017).   Orders:  No orders of the defined types were placed in this encounter.  No orders of the defined types were placed in this encounter.     Procedures: No procedures performed   Clinical Data: No additional findings.   Subjective: No chief complaint on file.   Patient comes in today for a splint check for his mallet finger.  He is doing well.  No complaints.    Review of Systems   Objective: Vital Signs: There were no vitals taken for this visit.  Physical Exam  Ortho Exam He has a well fitting splint.  There is no skin issues. Specialty Comments:  No specialty comments available.  Imaging: No results found.   PMFS History: Patient Active Problem List   Diagnosis Date Noted  . 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 Finger injury, left, initial encounter 03/18/2017  . Shuffling gait 12/29/2015  . Benign prostatic hyperplasia with urinary obstruction 08/28/2015  . Erectile dysfunction due to arterial insufficiency 08/28/2015  . Inguinal hernia, right s/p repair with mesh 09/15/15 08/28/2015  . Prediabetes 06/29/2015  . Fracture, humerus, proximal 12/30/2014  . Dysplastic nevus 09/05/2014  . History of pancreatitis 06/25/2013  . CKD (chronic kidney disease) 06/24/2013  . Syncope 06/19/2013  . TIA (transient ischemic attack) 02/07/2012  . GERD 04/03/2010  . Essential hypertension, benign 03/02/2009  . DUODENITIS 03/01/2009  . DIVERTICULAR DISEASE 03/01/2009   . PERSONAL HX COLONIC POLYPS 03/19/2008  . History of gout 12/09/2007  . Coronary atherosclerosis 12/09/2007  . Mixed hyperlipidemia 06/04/2007   Past Medical History:  Diagnosis Date  . Cancer ([place])    skin  . CKD (chronic kidney disease) 06/24/2013  . Coronary artery disease    a. s/p CABG 2000; b. myoview 9/08: inf-septal scar with mild peri-infarct ischemia (low risk);  c.  Echo 8/13: mild focal basal septal hypertrophy, Gr 1 DD, mild LAE;  d. Lexiscan Myoview (06/20/2013): No reversible ischemia, inferior and septal infarct, inferior and septal hypokinesis, EF 60%;  e.  Echo (06/20/2011): EF 50-55%, normal wall motion, grade 1 diastolic dysfunction, mild LAE  . Diverticular disease   . Duodenitis   . Elevated PSA   . GERD (gastroesophageal reflux disease)   . Gout   . Humerus fracture    right  . Hydronephrosis with ureteropelvic junction obstruction   . Hypercalcemia 02/02/12   a. 01/2012: 10.7.  Marland Kitchen Hyperlipidemia   . Hypertension   . Pancreatitis    a. ? Simvastatin related  . Pneumonia age 5 or 18   "I think only once" (06/19/2013)  . Syncope    a. 06/2013  . TIA (transient ischemic attack)    a. 01/2012 - presentation as acute imbalance;  b. 01/2012 carotid U/S w/o signif stenosis;  c. 01/2012 Echo: EF 60-65%, Gr 1 DD.;  d.  Carotid US (06/20/2013): 1-39% bilateral ICA stenosis  . Tubular adenoma polyp of rectum   . Type II diabetes mellitus ([place])    "diet and exercise controlled at present" (06/19/2013)    Family History  Problem Relation Age of Onset  . Melanoma Father   . Heart attack Father         in 6s  . Diabetes Neg Hx     Past Surgical History:  Procedure Laterality Date  . APPENDECTOMY    . CARDIAC CATHETERIZATION  2000  . CHOLECYSTECTOMY    . COCHLEAR IMPLANT Right 1990's  . COLONOSCOPY W/ POLYPECTOMY     no polyp 2012  . CORONARY ARTERY BYPASS GRAFT  2000   CABG X5  . INGUINAL HERNIA REPAIR Right 09/15/2015   Procedure: OPEN REPAIR RIGHT INGUINAL HERNIA ;   Surgeon: Jackolyn Confer, MD;  [facility];  Service: General;  Laterality: Right;  . INSERTION OF MESH Right 09/15/2015   Procedure: INSERTION OF MESH;  Surgeon: Jackolyn Confer, MD;  [facility];  Service: General;  Laterality: Right;  . PARATHYROIDECTOMY  1978  . POLYPECTOMY    . REVERSE SHOULDER ARTHROPLASTY Right 12/30/2014   Procedure: Right shoulder ORIF, Biomet S3 Plate and ZImmer cable system ;  Surgeon: Netta Cedars, MD;  Location: [facility];  Service: Orthopedics;  Laterality: Right;  . right ankle surgery  age 89  . TONSILLECTOMY  1943  . trachestomy as child for throat closing     Social History   Occupational History  . retired Retired   Social History Main Topics  . Smoking status: Current Some Day Smoker    Years: 0.00    Types: Cigars  . Smokeless tobacco: Never Used     Comment: 06/19/2013 "rare cigar"  . Alcohol use Yes     Comment: 12/29/14 "might have a drink once/month"  . Drug use: No  . Sexual activity: Not Currently

## 2017-04-10 ENCOUNTER — Telehealth (INDEPENDENT_AMBULATORY_CARE_PROVIDER_SITE_OTHER): Payer: Self-pay | Admitting: *Deleted

## 2017-04-10 NOTE — Telephone Encounter (Signed)
Received call from Physical Therapist [place] wanting to know how you want to proceed with Physical Therapy for Mallet finger fx.   Please call [phone] or email at [email or web address]

## 2017-04-11 NOTE — Telephone Encounter (Signed)
IC [place] and advised.  They will continue to see him for weekly splint checks.

## 2017-04-11 NOTE — Telephone Encounter (Signed)
He doesn't need to start hand therapy until he comes back to see me

## 2017-04-11 NOTE — Telephone Encounter (Signed)
Please advise 

## 2017-04-26 ENCOUNTER — Telehealth: Payer: Self-pay | Admitting: Internal Medicine

## 2017-04-26 NOTE — Telephone Encounter (Signed)
No I did not say that - if needed he can have surgery

## 2017-04-26 NOTE — Telephone Encounter (Signed)
Daughter would like to know if patient has been told that he should not have surgery because of his age?  States dermatologist wants to do surgery.  But patient states doctor told him he couldn't have surgery due to age.

## 2017-04-29 NOTE — Telephone Encounter (Signed)
Spoke with Daughter to inform.

## 2017-05-21 ENCOUNTER — Encounter (INDEPENDENT_AMBULATORY_CARE_PROVIDER_SITE_OTHER): Payer: Self-pay | Admitting: Orthopaedic Surgery

## 2017-05-21 ENCOUNTER — Ambulatory Visit (INDEPENDENT_AMBULATORY_CARE_PROVIDER_SITE_OTHER): Payer: Federal, State, Local not specified - PPO | Admitting: Orthopaedic Surgery

## 2017-05-21 DIAGNOSIS — M20012 Mallet finger of left finger(s): Secondary | ICD-10-CM

## 2017-05-21 NOTE — Progress Notes (Signed)
Office Visit Note   Patient: Juan [name]           Date of Birth: 27-Feb-1936           MRN: [id number] Visit Date: 05/21/2017              Requested by: Binnie Rail, MD [place], [place] [address] PCP: Binnie Rail, MD   Assessment & Plan: Visit Diagnoses:  1. Mallet deformity of left middle finger     Plan: At this point we will begin hand therapy for mobilization of the DIP joint.  Questions encouraged and answered.  Follow-up as needed.  Discontinue the splint.  Follow-Up Instructions: Return if symptoms worsen or fail to improve.   Orders:  No orders of the defined types were placed in this encounter.  No orders of the defined types were placed in this encounter.     Procedures: No procedures performed   Clinical Data: No additional findings.   Subjective: Chief Complaint  Patient presents with  . Left Index Finger - Follow-up    Patient comes in today for follow-up of his mallet finger.  He is overall doing well.  He has been in his extension splint for 8 weeks now.    Review of Systems   Objective: Vital Signs: There were no vitals taken for this visit.  Physical Exam  Ortho Exam Index finger shows a fully extend the DIP joint.  He  is able to extend against resistance. Specialty Comments:  No specialty comments available.  Imaging: No results found.   PMFS History: Patient Active Problem List   Diagnosis Date Noted  . 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 Finger injury, left, initial encounter 03/18/2017  . Shuffling gait 12/29/2015  . Benign prostatic hyperplasia with urinary obstruction 08/28/2015  . Erectile dysfunction due to arterial insufficiency 08/28/2015  . Inguinal hernia, right s/p repair with mesh 09/15/15 08/28/2015  . Prediabetes 06/29/2015  . Fracture, humerus, proximal 12/30/2014  . Dysplastic nevus 09/05/2014  . History of pancreatitis 06/25/2013  . CKD (chronic kidney disease)  06/24/2013  . Syncope 06/19/2013  . TIA (transient ischemic attack) 02/07/2012  . GERD 04/03/2010  . Essential hypertension, benign 03/02/2009  . DUODENITIS 03/01/2009  . DIVERTICULAR DISEASE 03/01/2009  . PERSONAL HX COLONIC POLYPS 03/19/2008  . History of gout 12/09/2007  . Coronary atherosclerosis 12/09/2007  . Mixed hyperlipidemia 06/04/2007   Past Medical History:  Diagnosis Date  . Cancer ([place])    skin  . CKD (chronic kidney disease) 06/24/2013  . Coronary artery disease    a. s/p CABG 2000; b. myoview 9/08: inf-septal scar with mild peri-infarct ischemia (low risk);  c.  Echo 8/13: mild focal basal septal hypertrophy, Gr 1 DD, mild LAE;  d. Lexiscan Myoview (06/20/2013): No reversible ischemia, inferior and septal infarct, inferior and septal hypokinesis, EF 60%;  e.  Echo (06/20/2011): EF 50-55%, normal wall motion, grade 1 diastolic dysfunction, mild LAE  . Diverticular disease   . Duodenitis   . Elevated PSA   . GERD (gastroesophageal reflux disease)   . Gout   . Humerus fracture    right  . Hydronephrosis with ureteropelvic junction obstruction   . Hypercalcemia 02/02/12   a. 01/2012: 10.7.  Marland Kitchen Hyperlipidemia   . Hypertension   . Pancreatitis    a. ? Simvastatin related  . Pneumonia age 61 or 44   "I think only once" (06/19/2013)  . Syncope  a. 06/2013  . TIA (transient ischemic attack)    a. 01/2012 - presentation as acute imbalance;  b. 01/2012 carotid U/S w/o signif stenosis;  c. 01/2012 Echo: EF 60-65%, Gr 1 DD.;  d.  Carotid US (06/20/2013): 1-39% bilateral ICA stenosis  . Tubular adenoma polyp of rectum   . Type II diabetes mellitus ([place])    "diet and exercise controlled at present" (06/19/2013)    Family History  Problem Relation Age of Onset  . Melanoma Father   . Heart attack Father         in 16s  . Diabetes Neg Hx     Past Surgical History:  Procedure Laterality Date  . APPENDECTOMY    . CARDIAC CATHETERIZATION  2000  . CHOLECYSTECTOMY    . COCHLEAR IMPLANT  Right 1990's  . COLONOSCOPY W/ POLYPECTOMY     no polyp 2012  . CORONARY ARTERY BYPASS GRAFT  2000   CABG X5  . INGUINAL HERNIA REPAIR Right 09/15/2015   Procedure: OPEN REPAIR RIGHT INGUINAL HERNIA ;  Surgeon: Jackolyn Confer, MD;  [facility];  Service: General;  Laterality: Right;  . INSERTION OF MESH Right 09/15/2015   Procedure: INSERTION OF MESH;  Surgeon: Jackolyn Confer, MD;  [facility];  Service: General;  Laterality: Right;  . PARATHYROIDECTOMY  1978  . POLYPECTOMY    . REVERSE SHOULDER ARTHROPLASTY Right 12/30/2014   Procedure: Right shoulder ORIF, Biomet S3 Plate and ZImmer cable system ;  Surgeon: Netta Cedars, MD;  Location: [facility];  Service: Orthopedics;  Laterality: Right;  . right ankle surgery  age 60  . TONSILLECTOMY  1943  . trachestomy as child for throat closing     Social History   Occupational History  . Occupation: retired    Fish farm manager: RETIRED  Tobacco Use  . Smoking status: Current Some Day Smoker    Years: 0.00    Types: Cigars  . Smokeless tobacco: Never Used  . Tobacco comment: 06/19/2013 "rare cigar"  Substance and Sexual Activity  . Alcohol use: Yes    Comment: 12/29/14 "might have a drink once/month"  . Drug use: No  . Sexual activity: Not Currently

## 2017-07-01 ENCOUNTER — Other Ambulatory Visit: Payer: Self-pay | Admitting: Internal Medicine

## 2017-07-03 NOTE — Progress Notes (Signed)
Subjective:    Patient ID: Juan [name], male    DOB: [birth year], 82 y.o.   MRN: [id number]  HPI The patient is here for follow up.  Hypertension: He is taking his medication daily. He is compliant with a low sodium diet.  He denies chest pain, palpitations, edema, shortness of breath and regular headaches. He is exercising regularly - 3/week.  He does not monitor his blood pressure at home.    Hyperlipidemia: He is taking his medication daily. He is compliant with a low fat/cholesterol diet. He is exercising regularly. He denies myalgias.   Gout:  He is taking allopurinol daily.  He denies any gout symptoms.   Prediabetes:  She is compliant with a low sugar/carbohydrate diet.  She is exercising regularly.   Medications and allergies reviewed with patient and updated if appropriate.  Patient Active Problem List   Diagnosis Date Noted  . 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 Finger injury, left, initial encounter 03/18/2017  . Shuffling gait 12/29/2015  . Benign prostatic hyperplasia with urinary obstruction 08/28/2015  . Erectile dysfunction due to arterial insufficiency 08/28/2015  . Inguinal hernia, right s/p repair with mesh 09/15/15 08/28/2015  . Prediabetes 06/29/2015  . Fracture, humerus, proximal 12/30/2014  . Dysplastic nevus 09/05/2014  . History of pancreatitis 06/25/2013  . CKD (chronic kidney disease) 06/24/2013  . Syncope 06/19/2013  . TIA (transient ischemic attack) 02/07/2012  . GERD 04/03/2010  . Essential hypertension, benign 03/02/2009  . DUODENITIS 03/01/2009  . DIVERTICULAR DISEASE 03/01/2009  . PERSONAL HX COLONIC POLYPS 03/19/2008  . History of gout 12/09/2007  . Coronary atherosclerosis 12/09/2007  . Mixed hyperlipidemia 06/04/2007    Current Outpatient Medications on File Prior to Visit  Medication Sig Dispense Refill  . allopurinol (ZYLOPRIM) 100 MG tablet TAKE 1 TABLET BY MOUTH ONCE DAILY 90 tablet 1  . amLODipine (NORVASC) 2.5  MG tablet TAKE 1 TABLET(2.5 MG) BY MOUTH DAILY 90 tablet 2  . aspirin EC 81 MG tablet Take 81 mg by mouth daily.    . blood glucose meter kit and supplies KIT Dispense based on patient and insurance preference. Use up to four times daily as directed. (FOR ICD-9 250.00, 250.01). 1 each 0  . Choline Fenofibrate (FENOFIBRIC ACID) 135 MG CPDR TAKE 1 CAPSULE BY MOUTH DAILY 30 capsule 0  . finasteride (PROSCAR) 5 MG tablet Take 5 mg by mouth daily.    . metoprolol tartrate (LOPRESSOR) 50 MG tablet TAKE 1/2 TABLET BY MOUTH TWICE DAILY 90 tablet 2  . pravastatin (PRAVACHOL) 20 MG tablet Take 20 mg by mouth 3 (three) times a week. Take 1 tablet Mon, Wed, Friday     No current facility-administered medications on file prior to visit.     Past Medical History:  Diagnosis Date  . Cancer ([place])    skin  . CKD (chronic kidney disease) 06/24/2013  . Coronary artery disease    a. s/p CABG 2000; b. myoview 9/08: inf-septal scar with mild peri-infarct ischemia (low risk);  c.  Echo 8/13: mild focal basal septal hypertrophy, Gr 1 DD, mild LAE;  d. Lexiscan Myoview (06/20/2013): No reversible ischemia, inferior and septal infarct, inferior and septal hypokinesis, EF 60%;  e.  Echo (06/20/2011): EF 50-55%, normal wall motion, grade 1 diastolic dysfunction, mild LAE  . Diverticular disease   . Duodenitis   . Elevated PSA   . GERD (gastroesophageal reflux disease)   . Gout   . Humerus fracture  right  . Hydronephrosis with ureteropelvic junction obstruction   . Hypercalcemia 02/02/12   a. 01/2012: 10.7.  Marland Kitchen Hyperlipidemia   . Hypertension   . Pancreatitis    a. ? Simvastatin related  . Pneumonia age 6 or 31   "I think only once" (06/19/2013)  . Syncope    a. 06/2013  . TIA (transient ischemic attack)    a. 01/2012 - presentation as acute imbalance;  b. 01/2012 carotid U/S w/o signif stenosis;  c. 01/2012 Echo: EF 60-65%, Gr 1 DD.;  d.  Carotid US (06/20/2013): 1-39% bilateral ICA stenosis  . Tubular adenoma polyp of  rectum   . Type II diabetes mellitus ([place])    "diet and exercise controlled at present" (06/19/2013)    Past Surgical History:  Procedure Laterality Date  . APPENDECTOMY    . CARDIAC CATHETERIZATION  2000  . CHOLECYSTECTOMY    . COCHLEAR IMPLANT Right 1990's  . COLONOSCOPY W/ POLYPECTOMY     no polyp 2012  . CORONARY ARTERY BYPASS GRAFT  2000   CABG X5  . INGUINAL HERNIA REPAIR Right 09/15/2015   Procedure: OPEN REPAIR RIGHT INGUINAL HERNIA ;  Surgeon: Jackolyn Confer, MD;  Location: [facility];  Service: General;  Laterality: Right;  . INSERTION OF MESH Right 09/15/2015   Procedure: INSERTION OF MESH;  Surgeon: Jackolyn Confer, MD;  Location: [facility];  Service: General;  Laterality: Right;  . PARATHYROIDECTOMY  1978  . POLYPECTOMY    . REVERSE SHOULDER ARTHROPLASTY Right 12/30/2014   Procedure: Right shoulder ORIF, Biomet S3 Plate and ZImmer cable system ;  Surgeon: Netta Cedars, MD;  Location: [place];  Service: Orthopedics;  Laterality: Right;  . right ankle surgery  age 49  . TONSILLECTOMY  1943  . trachestomy as child for throat closing      Social History   Socioeconomic History  . Marital status: Widowed    Spouse name: Not on file  . Number of children: Not on file  . Years of education: Not on file  . Highest education level: Not on file  Social Needs  . Financial resource strain: Not on file  . Food insecurity - worry: Not on file  . Food insecurity - inability: Not on file  . Transportation needs - medical: Not on file  . Transportation needs - non-medical: Not on file  Occupational History  . Occupation: retired    Fish farm manager: RETIRED  Tobacco Use  . Smoking status: Current Some Day Smoker    Years: 0.00    Types: Cigars  . Smokeless tobacco: Never Used  . Tobacco comment: 06/19/2013 "rare cigar"  Substance and Sexual Activity  . Alcohol use: Yes    Comment: 12/29/14 "might have a drink once/month"  . Drug use: No  . Sexual activity: Not Currently  Other Topics  Concern  . Not on file  Social History Narrative   Does get regular exercise    Family History  Problem Relation Age of Onset  . Melanoma Father   . Heart attack Father         in 31s  . Diabetes Neg Hx     Review of Systems  Constitutional: Negative for appetite change, chills and fever.  Respiratory: Negative for cough, shortness of breath and wheezing.   Cardiovascular: Negative for chest pain, palpitations and leg swelling.  Neurological: Negative for light-headedness and headaches.       Objective:   Vitals:   07/04/17 1016  BP: (!) 144/80  Pulse: 71  Resp: 16  Temp: 97.7 F (36.5 C)  SpO2: 98%   Wt Readings from Last 3 Encounters:  07/04/17 181 lb (82.1 kg)  03/18/17 182 lb (82.6 kg)  02/05/17 180 lb 12.8 oz (82 kg)   Body mass index is 25.24 kg/m.   Physical Exam    Constitutional: Appears well-developed and well-nourished. No distress.  HENT:  Head: Normocephalic and atraumatic.  Neck: Neck supple. No tracheal deviation present. No thyromegaly present.  No cervical lymphadenopathy Cardiovascular: Normal rate, regular rhythm and normal heart sounds.   No murmur heard. No carotid bruit .  No edema Pulmonary/Chest: Effort normal and breath sounds normal. No respiratory distress. No has no wheezes. No rales.  Skin: Skin is warm and dry. Not diaphoretic.  Psychiatric: Normal mood and affect. Behavior is normal.      Assessment & Plan:    See Problem List for Assessment and Plan of chronic medical problems.

## 2017-07-03 NOTE — Patient Instructions (Addendum)
  Test(s) ordered today. Your results will be released to MyChart (or called to you) after review, usually within 72hours after test completion. If any changes need to be made, you will be notified at that same time.  Medications reviewed and updated.  No changes recommended at this time.    Please followup in 6 months   

## 2017-07-04 ENCOUNTER — Encounter: Payer: Self-pay | Admitting: Internal Medicine

## 2017-07-04 ENCOUNTER — Other Ambulatory Visit (INDEPENDENT_AMBULATORY_CARE_PROVIDER_SITE_OTHER): Payer: Federal, State, Local not specified - PPO

## 2017-07-04 ENCOUNTER — Ambulatory Visit (INDEPENDENT_AMBULATORY_CARE_PROVIDER_SITE_OTHER): Payer: Federal, State, Local not specified - PPO | Admitting: Internal Medicine

## 2017-07-04 VITALS — BP 144/80 | HR 71 | Temp 97.7°F | Resp 16 | Wt 181.0 lb

## 2017-07-04 DIAGNOSIS — E119 Type 2 diabetes mellitus without complications: Secondary | ICD-10-CM | POA: Insufficient documentation

## 2017-07-04 DIAGNOSIS — E1129 Type 2 diabetes mellitus with other diabetic kidney complication: Secondary | ICD-10-CM | POA: Insufficient documentation

## 2017-07-04 DIAGNOSIS — R7303 Prediabetes: Secondary | ICD-10-CM | POA: Diagnosis not present

## 2017-07-04 DIAGNOSIS — I10 Essential (primary) hypertension: Secondary | ICD-10-CM

## 2017-07-04 DIAGNOSIS — Z8739 Personal history of other diseases of the musculoskeletal system and connective tissue: Secondary | ICD-10-CM

## 2017-07-04 DIAGNOSIS — E782 Mixed hyperlipidemia: Secondary | ICD-10-CM

## 2017-07-04 LAB — LIPID PANEL
CHOLESTEROL: 147 mg/dL (ref 0–200)
HDL: 34 mg/dL — ABNORMAL LOW (ref >39.00)
LDL Cholesterol: 82 mg/dL (ref 0–99)
NonHDL: 112.86
TRIGLYCERIDES: 152 mg/dL — AB (ref 0.0–149.0)
Total CHOL/HDL Ratio: 4
VLDL: 30.4 mg/dL (ref 0.0–40.0)

## 2017-07-04 LAB — COMPREHENSIVE METABOLIC PANEL
ALBUMIN: 4.2 g/dL (ref 3.5–5.2)
ALT: 14 U/L (ref 0–53)
AST: 19 U/L (ref 0–37)
Alkaline Phosphatase: 41 U/L (ref 39–117)
BILIRUBIN TOTAL: 0.6 mg/dL (ref 0.2–1.2)
BUN: 35 mg/dL — AB (ref 6–23)
CALCIUM: 10.4 mg/dL (ref 8.4–10.5)
CO2: 29 meq/L (ref 19–32)
CREATININE: 1.26 mg/dL (ref 0.40–1.50)
Chloride: 106 mEq/L (ref 96–112)
GFR: 58.61 mL/min — ABNORMAL LOW (ref >60.00)
Glucose, Bld: 134 mg/dL — ABNORMAL HIGH (ref 70–99)
Potassium: 3.9 mEq/L (ref 3.5–5.1)
SODIUM: 140 meq/L (ref 135–145)
Total Protein: 7 g/dL (ref 6.0–8.3)

## 2017-07-04 LAB — HEMOGLOBIN A1C: HEMOGLOBIN A1C: 6.6 % — AB (ref 4.6–6.5)

## 2017-07-04 NOTE — Assessment & Plan Note (Signed)
Check lipid panel  Continue daily statin Regular exercise and healthy diet encouraged  

## 2017-07-04 NOTE — Assessment & Plan Note (Signed)
BP Readings from Last 3 Encounters:  07/04/17 (!) 144/80  03/18/17 128/70  02/05/17 120/84   BP well controlled Current regimen effective and well tolerated Continue current medications at current doses cmp

## 2017-07-04 NOTE — Assessment & Plan Note (Signed)
Check a1c Low sugar / carb diet Stressed regular exercise   

## 2017-07-04 NOTE — Assessment & Plan Note (Signed)
No symptoms of gout Well controlled with allopurinol Continue allopurinol at current dose

## 2017-07-13 ENCOUNTER — Other Ambulatory Visit: Payer: Self-pay | Admitting: Internal Medicine

## 2017-07-17 ENCOUNTER — Encounter: Payer: Self-pay | Admitting: Internal Medicine

## 2017-07-18 ENCOUNTER — Other Ambulatory Visit: Payer: Self-pay | Admitting: Cardiovascular Disease

## 2017-07-22 ENCOUNTER — Telehealth: Payer: Self-pay | Admitting: Emergency Medicine

## 2017-07-22 NOTE — Telephone Encounter (Signed)
[facility] Medical Information form is ready for pick up at front office, LVM notifying pt. Spoke with pts daughter to inform.

## 2017-07-31 ENCOUNTER — Other Ambulatory Visit: Payer: Self-pay | Admitting: Internal Medicine

## 2017-08-04 ENCOUNTER — Other Ambulatory Visit: Payer: Self-pay | Admitting: Internal Medicine

## 2017-08-04 DIAGNOSIS — I10 Essential (primary) hypertension: Secondary | ICD-10-CM

## 2017-09-15 ENCOUNTER — Other Ambulatory Visit: Payer: Self-pay | Admitting: Internal Medicine

## 2017-12-03 ENCOUNTER — Telehealth: Payer: Self-pay | Admitting: Internal Medicine

## 2017-12-03 DIAGNOSIS — R2689 Other abnormalities of gait and mobility: Secondary | ICD-10-CM

## 2017-12-03 NOTE — Telephone Encounter (Signed)
ordered

## 2017-12-03 NOTE — Telephone Encounter (Signed)
Pt daughter came in and informed us the pt has now moved to [place] they have a physical therapists that can see the patient if Dr. Quay Burow would send an order over for physcial therapy to help his shuffling gait.   [facility] Coordinator  Phone [phone] Fax [phone]  Please call daughter Arbie Cookey for any questions or Mychart.

## 2017-12-11 ENCOUNTER — Encounter: Payer: Self-pay | Admitting: Internal Medicine

## 2018-01-02 ENCOUNTER — Ambulatory Visit: Payer: Federal, State, Local not specified - PPO | Admitting: Internal Medicine

## 2018-01-11 NOTE — Progress Notes (Signed)
Subjective:    Patient ID: Juan [name], male    DOB: [birth year], 82 y.o.   MRN: [id number]  HPI The patient is here for follow up.  Hypertension: He is taking his medication daily. He is compliant with a low sodium diet.  He denies chest pain, palpitations, edema, shortness of breath and regular headaches. He is exercising regularly - 3 times per week.    Hyperlipidemia: He is taking his medication daily. He is compliant with a low fat/cholesterol diet. He is exercising regularly. He denies myalgias.   Diabetes: He was diagnosed with diabetes in January 2019 and is controlling his sugars with diet.  He is fairly compliant with a diabetic diet. He is exercising regularly.  He checks his feet daily and denies foot lesions. He is up-to-date with an ophthalmology examination.   Gout:  He is taking allopurinol daily.  He denies any gout symptoms since he was here last.    Medications and allergies reviewed with patient and updated if appropriate.  Patient Active Problem List   Diagnosis Date Noted  . Diabetes mellitus without complication ([place]) 03/00/9233  . 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 Shuffling gait 12/29/2015  . Benign prostatic hyperplasia with urinary obstruction 08/28/2015  . Erectile dysfunction due to arterial insufficiency 08/28/2015  . Inguinal hernia, right s/p repair with mesh 09/15/15 08/28/2015  . Fracture, humerus, proximal 12/30/2014  . Dysplastic nevus 09/05/2014  . History of pancreatitis 06/25/2013  . CKD (chronic kidney disease) 06/24/2013  . Syncope 06/19/2013  . TIA (transient ischemic attack) 02/07/2012  . GERD 04/03/2010  . Essential hypertension, benign 03/02/2009  . DUODENITIS 03/01/2009  . DIVERTICULAR DISEASE 03/01/2009  . PERSONAL HX COLONIC POLYPS 03/19/2008  . History of gout 12/09/2007  . Coronary atherosclerosis 12/09/2007  . Mixed hyperlipidemia 06/04/2007    Current Outpatient Medications on File Prior to Visit    Medication Sig Dispense Refill  . allopurinol (ZYLOPRIM) 100 MG tablet TAKE 1 TABLET BY MOUTH ONCE DAILY 90 tablet 1  . amLODipine (NORVASC) 2.5 MG tablet TAKE 1 TABLET(2.5 MG) BY MOUTH DAILY 90 tablet 1  . aspirin EC 81 MG tablet Take 81 mg by mouth daily.    . blood glucose meter kit and supplies KIT Dispense based on patient and insurance preference. Use up to four times daily as directed. (FOR ICD-9 250.00, 250.01). 1 each 0  . Choline Fenofibrate (FENOFIBRIC ACID) 135 MG CPDR TAKE 1 CAPSULE BY MOUTH DAILY 90 capsule 1  . finasteride (PROSCAR) 5 MG tablet Take 5 mg by mouth daily.    . metoprolol tartrate (LOPRESSOR) 50 MG tablet TAKE 1/2 TABLET BY MOUTH TWICE DAILY 90 tablet 1  . pravastatin (PRAVACHOL) 20 MG tablet Take 20 mg by mouth 3 (three) times a week. Take 1 tablet Mon, Wed, Friday    . pravastatin (PRAVACHOL) 20 MG tablet TAKE 1 TABLET(20 MG) BY MOUTH DAILY 30 tablet 3   No current facility-administered medications on file prior to visit.     Past Medical History:  Diagnosis Date  . Cancer ([place])    skin  . CKD (chronic kidney disease) 06/24/2013  . Coronary artery disease    a. s/p CABG 2000; b. myoview 9/08: inf-septal scar with mild peri-infarct ischemia (low risk);  c.  Echo 8/13: mild focal basal septal hypertrophy, Gr 1 DD, mild LAE;  d. Lexiscan Myoview (06/20/2013): No reversible ischemia, inferior and septal infarct, inferior and septal hypokinesis, EF 60%;  e.  Echo (06/20/2011): EF 50-55%, normal wall motion, grade 1 diastolic dysfunction, mild LAE  . Diverticular disease   . Duodenitis   . Elevated PSA   . GERD (gastroesophageal reflux disease)   . Gout   . Humerus fracture    right  . Hydronephrosis with ureteropelvic junction obstruction   . Hypercalcemia 02/02/12   a. 01/2012: 10.7.  Marland Kitchen Hyperlipidemia   . Hypertension   . Pancreatitis    a. ? Simvastatin related  . Pneumonia age 74 or 46   "I think only once" (06/19/2013)  . Syncope    a. 06/2013  . TIA  (transient ischemic attack)    a. 01/2012 - presentation as acute imbalance;  b. 01/2012 carotid U/S w/o signif stenosis;  c. 01/2012 Echo: EF 60-65%, Gr 1 DD.;  d.  Carotid US (06/20/2013): 1-39% bilateral ICA stenosis  . Tubular adenoma polyp of rectum   . Type II diabetes mellitus ([place])    "diet and exercise controlled at present" (06/19/2013)    Past Surgical History:  Procedure Laterality Date  . APPENDECTOMY    . CARDIAC CATHETERIZATION  2000  . CHOLECYSTECTOMY    . COCHLEAR IMPLANT Right 1990's  . COLONOSCOPY W/ POLYPECTOMY     no polyp 2012  . CORONARY ARTERY BYPASS GRAFT  2000   CABG X5  . INGUINAL HERNIA REPAIR Right 09/15/2015   Procedure: OPEN REPAIR RIGHT INGUINAL HERNIA ;  Surgeon: Jackolyn Confer, MD;  [facility];  Service: General;  Laterality: Right;  . INSERTION OF MESH Right 09/15/2015   Procedure: INSERTION OF MESH;  Surgeon: Jackolyn Confer, MD;  Location[facility];  Service: General;  Laterality: Right;  . PARATHYROIDECTOMY  1978  . POLYPECTOMY    . REVERSE SHOULDER ARTHROPLASTY Right 12/30/2014   Procedure: Right shoulder ORIF, Biomet S3 Plate and ZImmer cable system ;  Surgeon: Netta Cedars, MD;  Location: [place];  Service: Orthopedics;  Laterality: Right;  . right ankle surgery  age 82  . TONSILLECTOMY  1943  . trachestomy as child for throat closing      Social History   Socioeconomic History  . Marital status: Widowed    Spouse name: Not on file  . Number of children: Not on file  . Years of education: Not on file  . Highest education level: Not on file  Occupational History  . Occupation: retired    Fish farm manager: RETIRED  Social Needs  . Financial resource strain: Not on file  . Food insecurity:    Worry: Not on file    Inability: Not on file  . Transportation needs:    Medical: Not on file    Non-medical: Not on file  Tobacco Use  . Smoking status: Current Some Day Smoker    Years: 0.00    Types: Cigars  . Smokeless tobacco: Never Used  . Tobacco  comment: 06/19/2013 "rare cigar"  Substance and Sexual Activity  . Alcohol use: Yes    Comment: 12/29/14 "might have a drink once/month"  . Drug use: No  . Sexual activity: Not Currently  Lifestyle  . Physical activity:    Days per week: Not on file    Minutes per session: Not on file  . Stress: Not on file  Relationships  . Social connections:    Talks on phone: Not on file    Gets together: Not on file    Attends religious service: Not on file    Active member of club or organization: Not on  file    Attends meetings of clubs or organizations: Not on file    Relationship status: Not on file  Other Topics Concern  . Not on file  Social History Narrative   Does get regular exercise    Family History  Problem Relation Age of Onset  . Melanoma Father   . Heart attack Father         in 67s  . Diabetes Neg Hx     Review of Systems  Constitutional: Negative for chills and fever.  Respiratory: Negative for cough, shortness of breath and wheezing.   Cardiovascular: Negative for chest pain, palpitations and leg swelling.  Neurological: Negative for light-headedness and headaches.       Objective:   Vitals:   01/13/18 1514  BP: 112/64  Pulse: 78  Resp: 16  Temp: 97.8 F (36.6 C)  SpO2: 95%   BP Readings from Last 3 Encounters:  01/13/18 112/64  07/04/17 (!) 144/80  03/18/17 128/70   Wt Readings from Last 3 Encounters:  01/13/18 186 lb (84.4 kg)  07/04/17 181 lb (82.1 kg)  03/18/17 182 lb (82.6 kg)   Body mass index is 25.94 kg/m.   Physical Exam    Constitutional: Appears well-developed and well-nourished. No distress.  HENT:  Head: Normocephalic and atraumatic.  Neck: Neck supple. No tracheal deviation present. No thyromegaly present.  No cervical lymphadenopathy Cardiovascular: Normal rate, regular rhythm and normal heart sounds.   No murmur heard. No carotid bruit .  Mild ankle b/l edema Pulmonary/Chest: Effort normal and breath sounds normal. No  respiratory distress. No has no wheezes. No rales.  Skin: Skin is warm and dry. Not diaphoretic.  Psychiatric: Normal mood and affect. Behavior is normal.    Diabetic Foot Exam - Simple   Simple Foot Form Diabetic Foot exam was performed with the following findings:  Yes 01/13/2018  3:46 PM  Visual Inspection No deformities, no ulcerations, no other skin breakdown bilaterally:  Yes Sensation Testing Intact to touch and monofilament testing bilaterally:  Yes Pulse Check Posterior Tibialis and Dorsalis pulse intact bilaterally:  Yes Comments Mild onychomycosis       Assessment & Plan:    See Problem List for Assessment and Plan of chronic medical problems.

## 2018-01-11 NOTE — Patient Instructions (Addendum)
  Test(s) ordered today. Your results will be released to MyChart (or called to you) after review, usually within 72hours after test completion. If any changes need to be made, you will be notified at that same time.  Medications reviewed and updated.  No changes recommended at this time.    Please followup in 6 months   

## 2018-01-13 ENCOUNTER — Other Ambulatory Visit (INDEPENDENT_AMBULATORY_CARE_PROVIDER_SITE_OTHER): Payer: Federal, State, Local not specified - PPO

## 2018-01-13 ENCOUNTER — Encounter: Payer: Self-pay | Admitting: Internal Medicine

## 2018-01-13 ENCOUNTER — Ambulatory Visit: Payer: Federal, State, Local not specified - PPO | Admitting: Internal Medicine

## 2018-01-13 VITALS — BP 112/64 | HR 78 | Temp 97.8°F | Resp 16 | Wt 186.0 lb

## 2018-01-13 DIAGNOSIS — I10 Essential (primary) hypertension: Secondary | ICD-10-CM | POA: Diagnosis not present

## 2018-01-13 DIAGNOSIS — Z8739 Personal history of other diseases of the musculoskeletal system and connective tissue: Secondary | ICD-10-CM | POA: Diagnosis not present

## 2018-01-13 DIAGNOSIS — E119 Type 2 diabetes mellitus without complications: Secondary | ICD-10-CM

## 2018-01-13 DIAGNOSIS — E782 Mixed hyperlipidemia: Secondary | ICD-10-CM

## 2018-01-13 LAB — COMPREHENSIVE METABOLIC PANEL
ALBUMIN: 4.4 g/dL (ref 3.5–5.2)
ALT: 17 U/L (ref 0–53)
AST: 19 U/L (ref 0–37)
Alkaline Phosphatase: 41 U/L (ref 39–117)
BUN: 32 mg/dL — ABNORMAL HIGH (ref 6–23)
CALCIUM: 10.6 mg/dL — AB (ref 8.4–10.5)
CHLORIDE: 105 meq/L (ref 96–112)
CO2: 26 mEq/L (ref 19–32)
Creatinine, Ser: 1.31 mg/dL (ref 0.40–1.50)
GFR: 55.96 mL/min — ABNORMAL LOW (ref >60.00)
Glucose, Bld: 172 mg/dL — ABNORMAL HIGH (ref 70–99)
POTASSIUM: 3.9 meq/L (ref 3.5–5.1)
Sodium: 140 mEq/L (ref 135–145)
Total Bilirubin: 0.6 mg/dL (ref 0.2–1.2)
Total Protein: 7.2 g/dL (ref 6.0–8.3)

## 2018-01-13 LAB — MICROALBUMIN / CREATININE URINE RATIO
CREATININE, U: 128.9 mg/dL
MICROALB/CREAT RATIO: 0.8 mg/g (ref 0.0–30.0)
Microalb, Ur: 1 mg/dL (ref 0.0–1.9)

## 2018-01-13 LAB — HEMOGLOBIN A1C: HEMOGLOBIN A1C: 6.8 % — AB (ref 4.6–6.5)

## 2018-01-13 NOTE — Assessment & Plan Note (Signed)
No gout symptoms since last visit Continue allopurinol 

## 2018-01-13 NOTE — Assessment & Plan Note (Signed)
BP well controlled Current regimen effective and well tolerated Continue current medications at current doses cmp  

## 2018-01-13 NOTE — Assessment & Plan Note (Signed)
Saw ortho - tried splint and PT - no improvement No pain Deferred referral for a second opinion

## 2018-01-13 NOTE — Assessment & Plan Note (Signed)
Lipid panel has been well controlled Continue pravachol

## 2018-01-13 NOTE — Assessment & Plan Note (Signed)
Check a1c, urine micro Low sugar / carb diet Continue regular exercise

## 2018-01-14 ENCOUNTER — Encounter: Payer: Self-pay | Admitting: Emergency Medicine

## 2018-01-29 ENCOUNTER — Other Ambulatory Visit: Payer: Self-pay | Admitting: Internal Medicine

## 2018-02-04 NOTE — Progress Notes (Signed)
Patient ID: Juan [name], male   DOB: 11/05/1935, 82 y.o.   MRN: [id number]   Juan [name] is a 82 y.o.  male with a hx of CAD, s/p CABG in 2000, HTN, HL, deafness s/p cochlear implant. Last myoview 2015: inf-septal scar with mild peri-infarct ischemia (low risk). 2015 also had vasovagal syncope with carotids showing plaque no stenosis  And EF 50-55% by TTE with no valve disease   01/16/15 fell on stairs and had right displaced humeral fracture surgically repaired by Dr Norris Still with poor ROM  Wife died 6 years ago Has two daughters that look after him He has a place in [place] but has not been in a while  Moved to [facility] in May and this seems to be working well for him   ROS: Denies fever, malais, weight loss, blurry vision, decreased visual acuity, cough, sputum, SOB, hemoptysis, pleuritic pain, palpitaitons, heartburn, abdominal pain, melena, lower extremity edema, claudication, or rash.  All other systems reviewed and negative  General: Affect appropriate Healthy:  appears stated age HEENT: choclear implant  Neck supple with no adenopathy JVP normal no bruits no thyromegaly Lungs clear with no wheezing and good diaphragmatic motion Heart:  S1/S2 SEM  murmur, no rub, gallop or click PMI normal Abdomen: benighn, BS positve, no tenderness, no AAA no bruit.  No HSM or HJR Distal pulses intact with no bruits No edema Neuro non-focal Skin warm and dry Post humeral fracture    Current Outpatient Medications  Medication Sig Dispense Refill  . allopurinol (ZYLOPRIM) 100 MG tablet TAKE 1 TABLET BY MOUTH ONCE DAILY 90 tablet 0  . amLODipine (NORVASC) 2.5 MG tablet TAKE 1 TABLET(2.5 MG) BY MOUTH DAILY 90 tablet 1  . aspirin EC 81 MG tablet Take 81 mg by mouth daily.    . blood glucose meter kit and supplies KIT Dispense based on patient and insurance preference. Use up to four times daily as directed. (FOR ICD-9 250.00, 250.01). 1 each 0  . Choline Fenofibrate (FENOFIBRIC  ACID) 135 MG CPDR TAKE 1 CAPSULE BY MOUTH DAILY 90 capsule 1  . finasteride (PROSCAR) 5 MG tablet Take 5 mg by mouth daily.    . metoprolol tartrate (LOPRESSOR) 50 MG tablet TAKE 1/2 TABLET BY MOUTH TWICE DAILY 90 tablet 1  . pravastatin (PRAVACHOL) 20 MG tablet Take 20 mg by mouth 3 (three) times a week. Take 1 tablet Mon, Wed, Friday     No current facility-administered medications for this visit.     Allergies  Simvastatin  Electrocardiogram:  02/06/18 SR rate 69 low voltage otherwise no acute changes   Assessment and Plan  CAD/CABG:  Distant 2000 no angina continue medical Rx Myovue 1/15 with old IMI no ischemia EF normal  Syncope:  Non cardiac carotids plaque no stenosis  Chol:   Cholesterol is at goal.  Continue current dose of statin and diet Rx.   Lab Results  Component Value Date   LDLCALC 82 07/04/2017           Prostate:  F/U PSA Dr Dahldstadt continue Proscar Ortho:  Needs more PT for frozen left shoulder post surgery/trauma f/u Dr Norris      

## 2018-02-06 ENCOUNTER — Encounter: Payer: Self-pay | Admitting: Cardiovascular Disease

## 2018-02-06 ENCOUNTER — Ambulatory Visit: Payer: Federal, State, Local not specified - PPO | Admitting: Cardiovascular Disease

## 2018-02-06 VITALS — BP 122/68 | HR 73 | Ht 71.0 in | Wt 187.6 lb

## 2018-02-06 DIAGNOSIS — I25810 Atherosclerosis of coronary artery bypass graft(s) without angina pectoris: Secondary | ICD-10-CM

## 2018-02-06 NOTE — Patient Instructions (Addendum)

## 2018-02-14 ENCOUNTER — Other Ambulatory Visit: Payer: Self-pay | Admitting: Internal Medicine

## 2018-02-14 DIAGNOSIS — I10 Essential (primary) hypertension: Secondary | ICD-10-CM

## 2018-02-26 ENCOUNTER — Other Ambulatory Visit: Payer: Self-pay | Admitting: Internal Medicine

## 2018-03-17 ENCOUNTER — Encounter: Payer: Self-pay | Admitting: Internal Medicine

## 2018-04-09 ENCOUNTER — Other Ambulatory Visit: Payer: Self-pay | Admitting: Internal Medicine

## 2018-04-27 ENCOUNTER — Other Ambulatory Visit: Payer: Self-pay | Admitting: Internal Medicine

## 2018-05-11 ENCOUNTER — Other Ambulatory Visit: Payer: Self-pay | Admitting: Internal Medicine

## 2018-05-14 ENCOUNTER — Other Ambulatory Visit: Payer: Self-pay | Admitting: Internal Medicine

## 2018-05-17 ENCOUNTER — Other Ambulatory Visit: Payer: Self-pay | Admitting: Internal Medicine

## 2018-05-17 DIAGNOSIS — I10 Essential (primary) hypertension: Secondary | ICD-10-CM

## 2018-05-27 ENCOUNTER — Other Ambulatory Visit: Payer: Self-pay | Admitting: Internal Medicine

## 2018-05-28 ENCOUNTER — Other Ambulatory Visit: Payer: Self-pay | Admitting: Internal Medicine

## 2018-05-30 ENCOUNTER — Other Ambulatory Visit: Payer: Self-pay | Admitting: Cardiovascular Disease

## 2018-06-29 ENCOUNTER — Other Ambulatory Visit: Payer: Self-pay | Admitting: Internal Medicine

## 2018-07-08 ENCOUNTER — Other Ambulatory Visit: Payer: Self-pay | Admitting: Internal Medicine

## 2018-07-16 ENCOUNTER — Telehealth: Payer: Self-pay | Admitting: *Deleted

## 2018-07-16 NOTE — Patient Instructions (Addendum)
  Tests ordered today. Your results will be released to MyChart (or called to you) after review, usually within 72hours after test completion. If any changes need to be made, you will be notified at that same time.  Medications reviewed and updated.  Changes include :   none      Please followup in 6 months   

## 2018-07-16 NOTE — Progress Notes (Signed)
Subjective:    Patient ID: Juan [name], male    DOB: 22-Sep-1935, 83 y.o.   MRN: [id number]  HPI The patient is here for follow up.  CAD, Hypertension: He is taking his medication daily. He is compliant with a low sodium diet.  He denies chest pain, palpitations, edema, shortness of breath and regular headaches. He is exercising regularly - three times a week.     Hyperlipidemia: He is taking his medication daily. He is compliant with a low fat/cholesterol diet. He is exercising regularly. He denies myalgias.   Diabetes: He is taking his medication daily as prescribed. He is compliant with a diabetic diet. He is exercising regularly.  He is up-to-date with an ophthalmology examination.   Gout:  He is taking allopurinol daily.  He denies any gout symptoms since he was here last.    CKD: He states he drinks some fluids during the day, but does not drink as much as he probably should.  Medications and allergies reviewed with patient and updated if appropriate.  Patient Active Problem List   Diagnosis Date Noted  . Diabetes mellitus without complication ([place]) 36/62/9476  . 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 Shuffling gait 12/29/2015  . Benign prostatic hyperplasia with urinary obstruction 08/28/2015  . Erectile dysfunction due to arterial insufficiency 08/28/2015  . Inguinal hernia, right s/p repair with mesh 09/15/15 08/28/2015  . Fracture, humerus, proximal 12/30/2014  . Dysplastic nevus 09/05/2014  . History of pancreatitis 06/25/2013  . CKD (chronic kidney disease) 06/24/2013  . Syncope 06/19/2013  . TIA (transient ischemic attack) 02/07/2012  . GERD 04/03/2010  . Essential hypertension, benign 03/02/2009  . DUODENITIS 03/01/2009  . DIVERTICULAR DISEASE 03/01/2009  . PERSONAL HX COLONIC POLYPS 03/19/2008  . History of gout 12/09/2007  . Coronary atherosclerosis 12/09/2007  . Mixed hyperlipidemia 06/04/2007    Current Outpatient Medications on File Prior  to Visit  Medication Sig Dispense Refill  . allopurinol (ZYLOPRIM) 100 MG tablet TAKE 1 TABLET BY MOUTH ONCE DAILY 90 tablet 0  . amLODipine (NORVASC) 2.5 MG tablet TAKE 1 TABLET(2.5 MG) BY MOUTH DAILY 90 tablet 0  . aspirin EC 81 MG tablet Take 81 mg by mouth daily.    . blood glucose meter kit and supplies KIT Dispense based on patient and insurance preference. Use up to four times daily as directed. (FOR ICD-9 250.00, 250.01). 1 each 0  . Choline Fenofibrate (FENOFIBRIC ACID) 135 MG CPDR TAKE 1 CAPSULE BY MOUTH DAILY. FOLLOW-UP APPOINTMENT DUE IN JAN 30 capsule 0  . finasteride (PROSCAR) 5 MG tablet Take 5 mg by mouth daily.    . metoprolol tartrate (LOPRESSOR) 50 MG tablet TAKE 1/2 TABLET BY MOUTH TWICE DAILY 90 tablet 0  . pravastatin (PRAVACHOL) 20 MG tablet Take 1 tablet (20 mg total) by mouth 3 (three) times a week. 36 tablet 3   No current facility-administered medications on file prior to visit.     Past Medical History:  Diagnosis Date  . Cancer ([place])    skin  . CKD (chronic kidney disease) 06/24/2013  . Coronary artery disease    a. s/p CABG 2000; b. myoview 9/08: inf-septal scar with mild peri-infarct ischemia (low risk);  c.  Echo 8/13: mild focal basal septal hypertrophy, Gr 1 DD, mild LAE;  d. Lexiscan Myoview (06/20/2013): No reversible ischemia, inferior and septal infarct, inferior and septal hypokinesis, EF 60%;  e.  Echo (06/20/2011): EF 50-55%, normal wall motion, grade 1 diastolic  dysfunction, mild LAE  . Diverticular disease   . Duodenitis   . Elevated PSA   . GERD (gastroesophageal reflux disease)   . Gout   . Humerus fracture    right  . Hydronephrosis with ureteropelvic junction obstruction   . Hypercalcemia 02/02/12   a. 01/2012: 10.7.  Marland Kitchen Hyperlipidemia   . Hypertension   . Pancreatitis    a. ? Simvastatin related  . Pneumonia age 89 or 84   "I think only once" (06/19/2013)  . Syncope    a. 06/2013  . TIA (transient ischemic attack)    a. 01/2012 - presentation  as acute imbalance;  b. 01/2012 carotid U/S w/o signif stenosis;  c. 01/2012 Echo: EF 60-65%, Gr 1 DD.;  d.  Carotid US (06/20/2013): 1-39% bilateral ICA stenosis  . Tubular adenoma polyp of rectum   . Type II diabetes mellitus ([place])    "diet and exercise controlled at present" (06/19/2013)    Past Surgical History:  Procedure Laterality Date  . APPENDECTOMY    . CARDIAC CATHETERIZATION  2000  . CHOLECYSTECTOMY    . COCHLEAR IMPLANT Right 1990's  . COLONOSCOPY W/ POLYPECTOMY     no polyp 2012  . CORONARY ARTERY BYPASS GRAFT  2000   CABG X5  . INGUINAL HERNIA REPAIR Right 09/15/2015   Procedure: OPEN REPAIR RIGHT INGUINAL HERNIA ;  Surgeon: Jackolyn Confer, MD;  Location: [facility];  Service: General;  Laterality: Right;  . INSERTION OF MESH Right 09/15/2015   Procedure: INSERTION OF MESH;  Surgeon: Jackolyn Confer, MD;  Location: [facility];  Service: General;  Laterality: Right;  . PARATHYROIDECTOMY  1978  . POLYPECTOMY    . REVERSE SHOULDER ARTHROPLASTY Right 12/30/2014   Procedure: Right shoulder ORIF, Biomet S3 Plate and ZImmer cable system ;  Surgeon: Netta Cedars, MD;  Location: [facility];  Service: Orthopedics;  Laterality: Right;  . right ankle surgery  age 41  . TONSILLECTOMY  1943  . trachestomy as child for throat closing      Social History   Socioeconomic History  . Marital status: Widowed    Spouse name: Not on file  . Number of children: Not on file  . Years of education: Not on file  . Highest education level: Not on file  Occupational History  . Occupation: retired    Fish farm manager: RETIRED  Social Needs  . Financial resource strain: Not on file  . Food insecurity:    Worry: Not on file    Inability: Not on file  . Transportation needs:    Medical: Not on file    Non-medical: Not on file  Tobacco Use  . Smoking status: Current Some Day Smoker    Years: 0.00    Types: Cigars  . Smokeless tobacco: Never Used  . Tobacco comment: 06/19/2013 "rare cigar"  Substance and Sexual  Activity  . Alcohol use: Yes    Comment: 12/29/14 "might have a drink once/month"  . Drug use: No  . Sexual activity: Not Currently  Lifestyle  . Physical activity:    Days per week: Not on file    Minutes per session: Not on file  . Stress: Not on file  Relationships  . Social connections:    Talks on phone: Not on file    Gets together: Not on file    Attends religious service: Not on file    Active member of club or organization: Not on file    Attends meetings of clubs or organizations:  Not on file    Relationship status: Not on file  Other Topics Concern  . Not on file  Social History Narrative   Does get regular exercise    Family History  Problem Relation Age of Onset  . Melanoma Father   . Heart attack Father         in 75s  . Diabetes Neg Hx     Review of Systems  Constitutional: Negative for appetite change, chills and fever.  Respiratory: Negative for cough, shortness of breath and wheezing.   Cardiovascular: Negative for chest pain, palpitations and leg swelling.  Neurological: Negative for light-headedness and headaches.  Psychiatric/Behavioral: Negative for sleep disturbance.       Objective:   Vitals:   07/17/18 1059  BP: 134/70  Pulse: 76  Resp: 16  Temp: 97.8 F (36.6 C)  SpO2: 94%   BP Readings from Last 3 Encounters:  07/17/18 134/70  02/06/18 122/68  01/13/18 112/64   Wt Readings from Last 3 Encounters:  07/17/18 188 lb (85.3 kg)  02/06/18 187 lb 9.6 oz (85.1 kg)  01/13/18 186 lb (84.4 kg)   Body mass index is 26.22 kg/m.   Physical Exam    Constitutional: Appears well-developed and well-nourished. No distress.  HENT:  Head: Normocephalic and atraumatic.  Neck: Neck supple. No tracheal deviation present. No thyromegaly present.  No cervical lymphadenopathy Cardiovascular: Normal rate, regular rhythm and normal heart sounds.   No murmur heard. No carotid bruit .  No edema Pulmonary/Chest: Effort normal and breath sounds  normal. No respiratory distress. No has no wheezes. No rales.  Skin: Skin is warm and dry. Not diaphoretic.  Psychiatric: Normal mood and affect. Behavior is normal.      Assessment & Plan:    See Problem List for Assessment and Plan of chronic medical problems.

## 2018-07-16 NOTE — Telephone Encounter (Signed)
Called and spoke patient's daughter to schedule AWV, scheduled 07/17/18.

## 2018-07-17 ENCOUNTER — Telehealth: Payer: Self-pay | Admitting: Internal Medicine

## 2018-07-17 ENCOUNTER — Encounter: Payer: Self-pay | Admitting: Internal Medicine

## 2018-07-17 ENCOUNTER — Ambulatory Visit: Payer: Federal, State, Local not specified - PPO | Admitting: Internal Medicine

## 2018-07-17 ENCOUNTER — Other Ambulatory Visit (INDEPENDENT_AMBULATORY_CARE_PROVIDER_SITE_OTHER): Payer: Federal, State, Local not specified - PPO

## 2018-07-17 ENCOUNTER — Ambulatory Visit: Payer: Federal, State, Local not specified - PPO

## 2018-07-17 VITALS — BP 134/70 | HR 76 | Temp 97.8°F | Resp 16 | Ht 71.0 in | Wt 188.0 lb

## 2018-07-17 DIAGNOSIS — E782 Mixed hyperlipidemia: Secondary | ICD-10-CM

## 2018-07-17 DIAGNOSIS — N189 Chronic kidney disease, unspecified: Secondary | ICD-10-CM | POA: Diagnosis not present

## 2018-07-17 DIAGNOSIS — Z8739 Personal history of other diseases of the musculoskeletal system and connective tissue: Secondary | ICD-10-CM

## 2018-07-17 DIAGNOSIS — E119 Type 2 diabetes mellitus without complications: Secondary | ICD-10-CM | POA: Diagnosis not present

## 2018-07-17 DIAGNOSIS — I2510 Atherosclerotic heart disease of native coronary artery without angina pectoris: Secondary | ICD-10-CM

## 2018-07-17 DIAGNOSIS — I10 Essential (primary) hypertension: Secondary | ICD-10-CM

## 2018-07-17 LAB — COMPREHENSIVE METABOLIC PANEL
ALT: 17 U/L (ref 0–53)
AST: 17 U/L (ref 0–37)
Albumin: 4.1 g/dL (ref 3.5–5.2)
Alkaline Phosphatase: 41 U/L (ref 39–117)
BILIRUBIN TOTAL: 0.6 mg/dL (ref 0.2–1.2)
BUN: 28 mg/dL — ABNORMAL HIGH (ref 6–23)
CO2: 26 mEq/L (ref 19–32)
Calcium: 10.2 mg/dL (ref 8.4–10.5)
Chloride: 105 mEq/L (ref 96–112)
Creatinine, Ser: 1.29 mg/dL (ref 0.40–1.50)
GFR: 53.53 mL/min — AB (ref >60.00)
Glucose, Bld: 229 mg/dL — ABNORMAL HIGH (ref 70–99)
Potassium: 3.9 mEq/L (ref 3.5–5.1)
Sodium: 139 mEq/L (ref 135–145)
Total Protein: 6.2 g/dL (ref 6.0–8.3)

## 2018-07-17 LAB — CBC WITH DIFFERENTIAL/PLATELET
Basophils Absolute: 0.1 10*3/uL (ref 0.0–0.1)
Basophils Relative: 0.9 % (ref 0.0–3.0)
Eosinophils Absolute: 0.2 10*3/uL (ref 0.0–0.7)
Eosinophils Relative: 2.2 % (ref 0.0–5.0)
HCT: 40.2 % (ref 39.0–52.0)
HEMOGLOBIN: 13.7 g/dL (ref 13.0–17.0)
Lymphocytes Relative: 17.9 % (ref 12.0–46.0)
Lymphs Abs: 1.2 10*3/uL (ref 0.7–4.0)
MCHC: 34.1 g/dL (ref 30.0–36.0)
MCV: 88.4 fl (ref 78.0–100.0)
Monocytes Absolute: 0.7 10*3/uL (ref 0.1–1.0)
Monocytes Relative: 9.8 % (ref 3.0–12.0)
Neutro Abs: 4.7 10*3/uL (ref 1.4–7.7)
Neutrophils Relative %: 69.2 % (ref 43.0–77.0)
Platelets: 231 10*3/uL (ref 150.0–400.0)
RBC: 4.55 Mil/uL (ref 4.22–5.81)
RDW: 13.7 % (ref 11.5–15.5)
WBC: 6.8 10*3/uL (ref 4.0–10.5)

## 2018-07-17 LAB — LIPID PANEL
Cholesterol: 152 mg/dL (ref 0–200)
HDL: 30.3 mg/dL — ABNORMAL LOW (ref >39.00)
NonHDL: 121.75
Total CHOL/HDL Ratio: 5
Triglycerides: 233 mg/dL — ABNORMAL HIGH (ref 0.0–149.0)
VLDL: 46.6 mg/dL — AB (ref 0.0–40.0)

## 2018-07-17 LAB — HEMOGLOBIN A1C: HEMOGLOBIN A1C: 7.6 % — AB (ref 4.6–6.5)

## 2018-07-17 LAB — LDL CHOLESTEROL, DIRECT: Direct LDL: 97 mg/dL

## 2018-07-17 NOTE — Assessment & Plan Note (Signed)
cmp

## 2018-07-17 NOTE — Telephone Encounter (Signed)
[facility] received 5 pages of medical records from [facility] - sending interoffice mail to Dr. Billey Gosling at [facility] on [address] 07/17/18  KLM

## 2018-07-17 NOTE — Assessment & Plan Note (Signed)
Check lipid panel  Continue daily statin Regular exercise and healthy diet encouraged  

## 2018-07-17 NOTE — Progress Notes (Deleted)
Subjective:   ADIAN JABLONOWSKI is a 83 y.o. male who presents for an Initial Medicare Annual Wellness Visit.  Review of Systems  No ROS.  Medicare Wellness Visit. Additional risk factors are reflected in the social history.    Sleep patterns: {SX; SLEEP PATTERNS:18802::"feels rested on waking","does not get up to void","gets up *** times nightly to void","sleeps *** hours nightly"}.    Home Safety/Smoke Alarms: Feels safe in home. Smoke alarms in place.  Living environment; residence and Firearm Safety: {Rehab home environment / accessibility:30080::"no firearms","firearms stored safely"}. Seat Belt Safety/Bike Helmet: Wears seat belt.    Objective:    There were no vitals filed for this visit. There is no height or weight on file to calculate BMI.  Advanced Directives 09/15/2015 09/15/2015 09/14/2015 12/28/2014 06/19/2013  Does Patient Have a Medical Advance Directive? Yes Yes Yes No Patient has advance directive, copy not in chart  Type of Advance Directive [place];Living will [place];Living will Living will;Healthcare Power of Attorney - -  Does patient want to make changes to medical advance directive? No - Patient declined No - Patient declined No - Patient declined - No  Copy of Healthcare Power of Attorney in Chart? No - copy requested - No - copy requested - Copy requested from family    Current Medications (verified) Outpatient Encounter Medications as of 07/17/2018  Medication Sig  . allopurinol (ZYLOPRIM) 100 MG tablet TAKE 1 TABLET BY MOUTH ONCE DAILY  . amLODipine (NORVASC) 2.5 MG tablet TAKE 1 TABLET(2.5 MG) BY MOUTH DAILY  . aspirin EC 81 MG tablet Take 81 mg by mouth daily.  . blood glucose meter kit and supplies KIT Dispense based on patient and insurance preference. Use up to four times daily as directed. (FOR ICD-9 250.00, 250.01).  . Choline Fenofibrate (FENOFIBRIC ACID) 135 MG CPDR TAKE 1 CAPSULE BY MOUTH DAILY. FOLLOW-UP  APPOINTMENT DUE IN JAN  . finasteride (PROSCAR) 5 MG tablet Take 5 mg by mouth daily.  . metoprolol tartrate (LOPRESSOR) 50 MG tablet TAKE 1/2 TABLET BY MOUTH TWICE DAILY  . pravastatin (PRAVACHOL) 20 MG tablet Take 1 tablet (20 mg total) by mouth 3 (three) times a week.   No facility-administered encounter medications on file as of 07/17/2018.     Allergies (verified) Simvastatin   History: Past Medical History:  Diagnosis Date  . Cancer ([place])    skin  . CKD (chronic kidney disease) 06/24/2013  . Coronary artery disease    a. s/p CABG 2000; b. myoview 9/08: inf-septal scar with mild peri-infarct ischemia (low risk);  c.  Echo 8/13: mild focal basal septal hypertrophy, Gr 1 DD, mild LAE;  d. Lexiscan Myoview (06/20/2013): No reversible ischemia, inferior and septal infarct, inferior and septal hypokinesis, EF 60%;  e.  Echo (06/20/2011): EF 50-55%, normal wall motion, grade 1 diastolic dysfunction, mild LAE  . Diverticular disease   . Duodenitis   . Elevated PSA   . GERD (gastroesophageal reflux disease)   . Gout   . Humerus fracture    right  . Hydronephrosis with ureteropelvic junction obstruction   . Hypercalcemia 02/02/12   a. 01/2012: 10.7.  Marland Kitchen Hyperlipidemia   . Hypertension   . Pancreatitis    a. ? Simvastatin related  . Pneumonia age 45 or 12   "I think only once" (06/19/2013)  . Syncope    a. 06/2013  . TIA (transient ischemic attack)    a. 01/2012 - presentation as acute imbalance;  b. 01/2012 carotid U/S w/o signif stenosis;  c. 01/2012 Echo: EF 60-65%, Gr 1 DD.;  d.  Carotid US (06/20/2013): 1-39% bilateral ICA stenosis  . Tubular adenoma polyp of rectum   . Type II diabetes mellitus ([place])    "diet and exercise controlled at present" (06/19/2013)   Past Surgical History:  Procedure Laterality Date  . APPENDECTOMY    . CARDIAC CATHETERIZATION  2000  . CHOLECYSTECTOMY    . COCHLEAR IMPLANT Right 1990's  . COLONOSCOPY W/ POLYPECTOMY     no polyp 2012  . CORONARY ARTERY BYPASS  GRAFT  2000   CABG X5  . INGUINAL HERNIA REPAIR Right 09/15/2015   Procedure: OPEN REPAIR RIGHT INGUINAL HERNIA ;  Surgeon: Jackolyn Confer, MD;  [facility];  Service: General;  Laterality: Right;  . INSERTION OF MESH Right 09/15/2015   Procedure: INSERTION OF MESH;  Surgeon: Jackolyn Confer, MD;  [facility];  Service: General;  Laterality: Right;  . PARATHYROIDECTOMY  1978  . POLYPECTOMY    . REVERSE SHOULDER ARTHROPLASTY Right 12/30/2014   Procedure: Right shoulder ORIF, Biomet S3 Plate and ZImmer cable system ;  Surgeon: Netta Cedars, MD;  Location: [facility];  Service: Orthopedics;  Laterality: Right;  . right ankle surgery  age 3  . TONSILLECTOMY  1943  . trachestomy as child for throat closing     Family History  Problem Relation Age of Onset  . Melanoma Father   . Heart attack Father         in 54s  . Diabetes Neg Hx    Social History   Socioeconomic History  . Marital status: Widowed    Spouse name: Not on file  . Number of children: Not on file  . Years of education: Not on file  . Highest education level: Not on file  Occupational History  . Occupation: retired    Fish farm manager: RETIRED  Social Needs  . Financial resource strain: Not on file  . Food insecurity:    Worry: Not on file    Inability: Not on file  . Transportation needs:    Medical: Not on file    Non-medical: Not on file  Tobacco Use  . Smoking status: Current Some Day Smoker    Years: 0.00    Types: Cigars  . Smokeless tobacco: Never Used  . Tobacco comment: 06/19/2013 "rare cigar"  Substance and Sexual Activity  . Alcohol use: Yes    Comment: 12/29/14 "might have a drink once/month"  . Drug use: No  . Sexual activity: Not Currently  Lifestyle  . Physical activity:    Days per week: Not on file    Minutes per session: Not on file  . Stress: Not on file  Relationships  . Social connections:    Talks on phone: Not on file    Gets together: Not on file    Attends religious service: Not on  file    Active member of club or organization: Not on file    Attends meetings of clubs or organizations: Not on file    Relationship status: Not on file  Other Topics Concern  . Not on file  Social History Narrative   Does get regular exercise   Tobacco Counseling Ready to quit: Not Answered Counseling given: Not Answered Comment: 06/19/2013 "rare cigar"  Activities of Daily Living No flowsheet data found.   Immunizations and Health Maintenance Immunization History  Administered Date(s) Administered  . Influenza Split 04/13/2011, 05/02/2012  .  Influenza Whole 05/22/2007, 04/12/2008, 05/02/2009, 04/14/2010  . Influenza, High Dose Seasonal PF 04/17/2013, 03/16/2014, 03/05/2016, 03/18/2017  . Influenza,inj,Quad PF,6+ Mos 03/02/2015, 03/18/2018  . Influenza-Unspecified 03/18/2018  . PPD Test 01/02/2015  . Pneumococcal Conjugate-13 12/29/2015  . Pneumococcal Polysaccharide-23 04/19/2006  . Tdap 09/04/2016  . Zoster 06/29/2015   Health Maintenance Due  Topic Date Due  . HEMOGLOBIN A1C  07/16/2018    Patient Care Team: Binnie Rail, MD as PCP - General (Internal Medicine) [name], MD as PCP - Cardiology (Cardiology)  Indicate any recent Medical Services you may have received from other than [facility] providers in the past year (date may be approximate).    Assessment:   This is a routine wellness examination for [place]. Physical assessment deferred to PCP.  Hearing/Vision screen No exam data present  Dietary issues and exercise activities discussed:   Diet (meal preparation, eat out, water intake, caffeinated beverages, dairy products, fruits and vegetables): {Desc; diets:16563}   Goals   None    Depression Screen PHQ 2/9 Scores 03/18/2017 03/05/2016 09/20/2014 07/23/2013  PHQ - 2 Score 0 0 0 0    Fall Risk Fall Risk  03/18/2017 03/05/2016 12/29/2015 09/20/2014 07/23/2013  Falls in the past year? No No Yes No No  Number falls in past yr: - - 1 - -  Injury with Fall? -  - No - -  Risk for fall due to : - - History of fall(s) - -  Follow up - - Falls evaluation completed - -  Comment - - tripped, feels his balance is good - -   Cognitive Function:        Screening Tests Health Maintenance  Topic Date Due  . HEMOGLOBIN A1C  07/16/2018  . OPHTHALMOLOGY EXAM  07/25/2018  . FOOT EXAM  01/14/2019  . URINE MICROALBUMIN  01/14/2019  . TETANUS/TDAP  09/05/2026  . INFLUENZA VACCINE  Completed  . PNA vac Low Risk Adult  Completed      Plan:     I have personally reviewed and noted the following in the patient's chart:   . Medical and social history . Use of alcohol, tobacco or illicit drugs  . Current medications and supplements . Functional ability and status . Nutritional status . Physical activity . Advanced directives . List of other physicians . Vitals . Screenings to include cognitive, depression, and falls . Referrals and appointments  In addition, I have reviewed and discussed with patient certain preventive protocols, quality metrics, and best practice recommendations. A written personalized care plan for preventive services as well as general preventive health recommendations were provided to patient.     Michiel Cowboy, RN   07/17/2018

## 2018-07-17 NOTE — Assessment & Plan Note (Signed)
Taking allopurinol daily No gout symptoms Continue allopurinol

## 2018-07-17 NOTE — Assessment & Plan Note (Signed)
Check A1c Diet controlled Continue diabetic diet and regular exercise Follow-up in 6 months

## 2018-07-17 NOTE — Assessment & Plan Note (Signed)
He denies chest pain, palpitations and shortness of breath He is exercising regularly Continue aspirin 81 mg daily, metoprolol and statin Check CBC, CMP, lipid

## 2018-07-17 NOTE — Assessment & Plan Note (Signed)
BP well controlled Current regimen effective and well tolerated Continue current medications at current doses cmp  

## 2018-07-18 ENCOUNTER — Encounter: Payer: Self-pay | Admitting: Emergency Medicine

## 2018-07-19 ENCOUNTER — Other Ambulatory Visit: Payer: Self-pay | Admitting: Internal Medicine

## 2018-07-19 MED ORDER — METFORMIN HCL 500 MG PO TABS: 500.0000 mg | ORAL_TABLET | Freq: Every day | ORAL | 1 refills | Status: DC

## 2018-08-02 ENCOUNTER — Other Ambulatory Visit: Payer: Self-pay | Admitting: Internal Medicine

## 2018-08-11 ENCOUNTER — Other Ambulatory Visit: Payer: Self-pay | Admitting: Internal Medicine

## 2018-08-19 ENCOUNTER — Other Ambulatory Visit: Payer: Self-pay | Admitting: Internal Medicine

## 2018-08-19 DIAGNOSIS — I10 Essential (primary) hypertension: Secondary | ICD-10-CM

## 2018-10-08 ENCOUNTER — Other Ambulatory Visit: Payer: Self-pay | Admitting: Internal Medicine

## 2019-01-14 NOTE — Patient Instructions (Addendum)
  Tests ordered today. Your results will be released to MyChart (or called to you) after review.  If any changes need to be made, you will be notified at that same time.    Medications reviewed and updated.  Changes include :   none     Please followup in 6 months   

## 2019-01-14 NOTE — Progress Notes (Signed)
Subjective:    Patient ID: Juan [name], male    DOB: 06-Jul-1935, 83 y.o.   MRN: [id number]  HPI The patient is here for follow up.  He is not exercising regularly.     CAD, Hypertension: He is taking his medication daily. He is compliant with a low sodium diet.  He denies chest pain, palpitations, edema, shortness of breath and regular headaches.  He does not monitor his blood pressure at home.   Hyperlipidemia: He is taking his medication daily. He is compliant with a low fat/cholesterol diet. He denies myalgias.   Diabetes: He is taking his medication daily as prescribed. He is compliant with a diabetic diet.  Marland Kitchen He checks his feet daily and denies foot lesions. He is up-to-date with an ophthalmology examination.   Gout:  He takes allopurinol daily.  He denies gout symptoms.   CKD:  He drinks some water during the day.    Medications and allergies reviewed with patient and updated if appropriate.  Patient Active Problem List   Diagnosis Date Noted  . Diabetes mellitus without complication ([place]) 89/38/1017  . 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 Shuffling gait 12/29/2015  . Benign prostatic hyperplasia with urinary obstruction 08/28/2015  . Erectile dysfunction due to arterial insufficiency 08/28/2015  . Inguinal hernia, right s/p repair with mesh 09/15/15 08/28/2015  . Fracture, humerus, proximal 12/30/2014  . Dysplastic nevus 09/05/2014  . History of pancreatitis 06/25/2013  . CKD (chronic kidney disease) 06/24/2013  . Syncope 06/19/2013  . TIA (transient ischemic attack) 02/07/2012  . GERD 04/03/2010  . Essential hypertension, benign 03/02/2009  . DUODENITIS 03/01/2009  . DIVERTICULAR DISEASE 03/01/2009  . PERSONAL HX COLONIC POLYPS 03/19/2008  . History of gout 12/09/2007  . Coronary atherosclerosis 12/09/2007  . Mixed hyperlipidemia 06/04/2007    Current Outpatient Medications on File Prior to Visit  Medication Sig Dispense Refill  . allopurinol  (ZYLOPRIM) 100 MG tablet TAKE 1 TABLET BY MOUTH ONCE DAILY 90 tablet 1  . amLODipine (NORVASC) 2.5 MG tablet TAKE 1 TABLET(2.5 MG) BY MOUTH DAILY 90 tablet 1  . aspirin EC 81 MG tablet Take 81 mg by mouth daily.    . blood glucose meter kit and supplies KIT Dispense based on patient and insurance preference. Use up to four times daily as directed. (FOR ICD-9 250.00, 250.01). 1 each 0  . Choline Fenofibrate (FENOFIBRIC ACID) 135 MG CPDR Take 1 capsule by mouth daily. 90 capsule 1  . finasteride (PROSCAR) 5 MG tablet Take 5 mg by mouth daily.    . metFORMIN (GLUCOPHAGE) 500 MG tablet Take 1 tablet (500 mg total) by mouth daily with breakfast. 90 tablet 1  . metoprolol tartrate (LOPRESSOR) 50 MG tablet TAKE 1/2 TABLET BY MOUTH TWICE DAILY 90 tablet 1  . pravastatin (PRAVACHOL) 20 MG tablet Take 1 tablet (20 mg total) by mouth 3 (three) times a week. 36 tablet 3   No current facility-administered medications on file prior to visit.     Past Medical History:  Diagnosis Date  . Cancer ([place])    skin  . CKD (chronic kidney disease) 06/24/2013  . Coronary artery disease    a. s/p CABG 2000; b. myoview 9/08: inf-septal scar with mild peri-infarct ischemia (low risk);  c.  Echo 8/13: mild focal basal septal hypertrophy, Gr 1 DD, mild LAE;  d. Lexiscan Myoview (06/20/2013): No reversible ischemia, inferior and septal infarct, inferior and septal hypokinesis, EF 60%;  e.  Echo (  06/20/2011): EF 50-55%, normal wall motion, grade 1 diastolic dysfunction, mild LAE  . Diverticular disease   . Duodenitis   . Elevated PSA   . GERD (gastroesophageal reflux disease)   . Gout   . Humerus fracture    right  . Hydronephrosis with ureteropelvic junction obstruction   . Hypercalcemia 02/02/12   a. 01/2012: 10.7.  Marland Kitchen Hyperlipidemia   . Hypertension   . Pancreatitis    a. ? Simvastatin related  . Pneumonia age 71 or 78   "I think only once" (06/19/2013)  . Syncope    a. 06/2013  . TIA (transient ischemic attack)    a.  01/2012 - presentation as acute imbalance;  b. 01/2012 carotid U/S w/o signif stenosis;  c. 01/2012 Echo: EF 60-65%, Gr 1 DD.;  d.  Carotid US (06/20/2013): 1-39% bilateral ICA stenosis  . Tubular adenoma polyp of rectum   . Type II diabetes mellitus ([place])    "diet and exercise controlled at present" (06/19/2013)    Past Surgical History:  Procedure Laterality Date  . APPENDECTOMY    . CARDIAC CATHETERIZATION  2000  . CHOLECYSTECTOMY    . COCHLEAR IMPLANT Right 1990's  . COLONOSCOPY W/ POLYPECTOMY     no polyp 2012  . CORONARY ARTERY BYPASS GRAFT  2000   CABG X5  . INGUINAL HERNIA REPAIR Right 09/15/2015   Procedure: OPEN REPAIR RIGHT INGUINAL HERNIA ;  Surgeon: Jackolyn Confer, MD;  Location: [facility];  Service: General;  Laterality: Right;  . INSERTION OF MESH Right 09/15/2015   Procedure: INSERTION OF MESH;  Surgeon: Jackolyn Confer, MD;  Location: [facility];  Service: General;  Laterality: Right;  . PARATHYROIDECTOMY  1978  . POLYPECTOMY    . REVERSE SHOULDER ARTHROPLASTY Right 12/30/2014   Procedure: Right shoulder ORIF, Biomet S3 Plate and ZImmer cable system ;  Surgeon: Netta Cedars, MD;  Location: [facility];  Service: Orthopedics;  Laterality: Right;  . right ankle surgery  age 48  . TONSILLECTOMY  1943  . trachestomy as child for throat closing      Social History   Socioeconomic History  . Marital status: Widowed    Spouse name: Not on file  . Number of children: Not on file  . Years of education: Not on file  . Highest education level: Not on file  Occupational History  . Occupation: retired    Fish farm manager: RETIRED  Social Needs  . Financial resource strain: Not on file  . Food insecurity    Worry: Not on file    Inability: Not on file  . Transportation needs    Medical: Not on file    Non-medical: Not on file  Tobacco Use  . Smoking status: Current Some Day Smoker    Years: 0.00    Types: Cigars  . Smokeless tobacco: Never Used  . Tobacco comment: 06/19/2013 "rare cigar"   Substance and Sexual Activity  . Alcohol use: Yes    Comment: 12/29/14 "might have a drink once/month"  . Drug use: No  . Sexual activity: Not Currently  Lifestyle  . Physical activity    Days per week: Not on file    Minutes per session: Not on file  . Stress: Not on file  Relationships  . Social Herbalist on phone: Not on file    Gets together: Not on file    Attends religious service: Not on file    Active member of club or organization: Not on file  Attends meetings of clubs or organizations: Not on file    Relationship status: Not on file  Other Topics Concern  . Not on file  Social History Narrative   Does get regular exercise    Family History  Problem Relation Age of Onset  . Melanoma Father   . Heart attack Father         in 19s  . Diabetes Neg Hx     Review of Systems  Constitutional: Negative for chills and fever.  Respiratory: Negative for cough, shortness of breath and wheezing.   Cardiovascular: Negative for chest pain, palpitations and leg swelling.  Neurological: Negative for light-headedness and headaches.       Objective:   Vitals:   01/15/19 0943  BP: 124/80  Pulse: 77  Resp: 16  Temp: 98.1 F (36.7 C)  SpO2: 95%   BP Readings from Last 3 Encounters:  01/15/19 124/80  07/17/18 134/70  02/06/18 122/68   Wt Readings from Last 3 Encounters:  01/15/19 189 lb (85.7 kg)  07/17/18 188 lb (85.3 kg)  02/06/18 187 lb 9.6 oz (85.1 kg)   Body mass index is 26.36 kg/m.   Physical Exam    Constitutional: Appears well-developed and well-nourished. No distress.  HENT:  Head: Normocephalic and atraumatic.  Neck: Neck supple. No tracheal deviation present. No thyromegaly present.  No cervical lymphadenopathy Cardiovascular: Normal rate, regular rhythm and normal heart sounds.   No murmur heard. No carotid bruit .  No edema Pulmonary/Chest: Effort normal and breath sounds normal. No respiratory distress. No has no wheezes. No rales.   Skin: Skin is warm and dry. Not diaphoretic.  Psychiatric: Normal mood and affect. Behavior is normal.   Diabetic Foot Exam - Simple   Simple Foot Form Diabetic Foot exam was performed with the following findings: Yes 01/15/2019 10:10 AM  Visual Inspection No deformities, no ulcerations, no other skin breakdown bilaterally: Yes See comments: Yes Sensation Testing Intact to touch and monofilament testing bilaterally: Yes Pulse Check Posterior Tibialis and Dorsalis pulse intact bilaterally: Yes Comments Mild onychomycosis, dry skin dryness       Assessment & Plan:    See Problem List for Assessment and Plan of chronic medical problems.

## 2019-01-15 ENCOUNTER — Other Ambulatory Visit: Payer: Self-pay

## 2019-01-15 ENCOUNTER — Encounter: Payer: Self-pay | Admitting: Internal Medicine

## 2019-01-15 ENCOUNTER — Other Ambulatory Visit (INDEPENDENT_AMBULATORY_CARE_PROVIDER_SITE_OTHER): Payer: Federal, State, Local not specified - PPO

## 2019-01-15 ENCOUNTER — Ambulatory Visit (INDEPENDENT_AMBULATORY_CARE_PROVIDER_SITE_OTHER): Payer: Federal, State, Local not specified - PPO | Admitting: Internal Medicine

## 2019-01-15 VITALS — BP 124/80 | HR 77 | Temp 98.1°F | Resp 16 | Ht 71.0 in | Wt 189.0 lb

## 2019-01-15 DIAGNOSIS — Z8739 Personal history of other diseases of the musculoskeletal system and connective tissue: Secondary | ICD-10-CM

## 2019-01-15 DIAGNOSIS — I10 Essential (primary) hypertension: Secondary | ICD-10-CM | POA: Diagnosis not present

## 2019-01-15 DIAGNOSIS — N189 Chronic kidney disease, unspecified: Secondary | ICD-10-CM | POA: Diagnosis not present

## 2019-01-15 DIAGNOSIS — E782 Mixed hyperlipidemia: Secondary | ICD-10-CM

## 2019-01-15 DIAGNOSIS — E119 Type 2 diabetes mellitus without complications: Secondary | ICD-10-CM

## 2019-01-15 LAB — CBC WITH DIFFERENTIAL/PLATELET
Basophils Absolute: 0.1 10*3/uL (ref 0.0–0.1)
Basophils Relative: 1.2 % (ref 0.0–3.0)
Eosinophils Absolute: 0.2 10*3/uL (ref 0.0–0.7)
Eosinophils Relative: 3.7 % (ref 0.0–5.0)
HCT: 41.1 % (ref 39.0–52.0)
Hemoglobin: 13.9 g/dL (ref 13.0–17.0)
Lymphocytes Relative: 29.9 % (ref 12.0–46.0)
Lymphs Abs: 1.4 10*3/uL (ref 0.7–4.0)
MCHC: 33.9 g/dL (ref 30.0–36.0)
MCV: 89.3 fl (ref 78.0–100.0)
Monocytes Absolute: 0.6 10*3/uL (ref 0.1–1.0)
Monocytes Relative: 11.6 % (ref 3.0–12.0)
Neutro Abs: 2.6 10*3/uL (ref 1.4–7.7)
Neutrophils Relative %: 53.6 % (ref 43.0–77.0)
Platelets: 244 10*3/uL (ref 150.0–400.0)
RBC: 4.6 Mil/uL (ref 4.22–5.81)
RDW: 13.7 % (ref 11.5–15.5)
WBC: 4.8 10*3/uL (ref 4.0–10.5)

## 2019-01-15 LAB — LIPID PANEL
Cholesterol: 149 mg/dL (ref 0–200)
HDL: 33 mg/dL — ABNORMAL LOW (ref >39.00)
LDL Cholesterol: 80 mg/dL (ref 0–99)
NonHDL: 116.46
Total CHOL/HDL Ratio: 5
Triglycerides: 184 mg/dL — ABNORMAL HIGH (ref 0.0–149.0)
VLDL: 36.8 mg/dL (ref 0.0–40.0)

## 2019-01-15 LAB — COMPREHENSIVE METABOLIC PANEL
ALT: 18 U/L (ref 0–53)
AST: 21 U/L (ref 0–37)
Albumin: 4.3 g/dL (ref 3.5–5.2)
Alkaline Phosphatase: 38 U/L — ABNORMAL LOW (ref 39–117)
BUN: 36 mg/dL — ABNORMAL HIGH (ref 6–23)
CO2: 28 mEq/L (ref 19–32)
Calcium: 10.5 mg/dL (ref 8.4–10.5)
Chloride: 105 mEq/L (ref 96–112)
Creatinine, Ser: 1.37 mg/dL (ref 0.40–1.50)
GFR: 49.88 mL/min — ABNORMAL LOW (ref >60.00)
Glucose, Bld: 149 mg/dL — ABNORMAL HIGH (ref 70–99)
Potassium: 3.9 mEq/L (ref 3.5–5.1)
Sodium: 139 mEq/L (ref 135–145)
Total Bilirubin: 0.6 mg/dL (ref 0.2–1.2)
Total Protein: 6.8 g/dL (ref 6.0–8.3)

## 2019-01-15 LAB — MICROALBUMIN / CREATININE URINE RATIO
Creatinine,U: 77.7 mg/dL
Microalb Creat Ratio: 1 mg/g (ref 0.0–30.0)
Microalb, Ur: 0.8 mg/dL (ref 0.0–1.9)

## 2019-01-15 LAB — HEMOGLOBIN A1C: Hgb A1c MFr Bld: 6.7 % — ABNORMAL HIGH (ref 4.6–6.5)

## 2019-01-15 NOTE — Assessment & Plan Note (Signed)
Taking allopurinol daily No concerning symptoms Continue

## 2019-01-15 NOTE — Assessment & Plan Note (Signed)
Check lipid, CMP Continue pravastatin

## 2019-01-15 NOTE — Assessment & Plan Note (Signed)
Check A1c, urine microalbumin He is tolerating his medication well-we will adjust if necessary

## 2019-01-15 NOTE — Assessment & Plan Note (Signed)
BP well controlled Current regimen effective and well tolerated Continue current medications at current doses CMP 

## 2019-01-15 NOTE — Assessment & Plan Note (Signed)
CMP

## 2019-01-17 ENCOUNTER — Other Ambulatory Visit: Payer: Self-pay | Admitting: Internal Medicine

## 2019-01-30 ENCOUNTER — Telehealth: Payer: Self-pay

## 2019-01-30 NOTE — Telephone Encounter (Signed)
Copied from [place] [id number]. Topic: General - Call Back - No Documentation >> Jan 29, 2019  3:05 PM Erick Blinks wrote: Reason for CRM: Pt called back to discuss lab results and request to have them mailed to his results. Instructed about my chart message from PCP.  Best contact: [phone]

## 2019-01-30 NOTE — Telephone Encounter (Signed)
Spoke with daughter about labs that were sent through my chart. Will discuss results with patient. Results sent by mail.

## 2019-02-05 ENCOUNTER — Other Ambulatory Visit: Payer: Self-pay | Admitting: Internal Medicine

## 2019-02-21 ENCOUNTER — Other Ambulatory Visit: Payer: Self-pay | Admitting: Internal Medicine

## 2019-02-21 DIAGNOSIS — I10 Essential (primary) hypertension: Secondary | ICD-10-CM

## 2019-03-03 NOTE — Progress Notes (Signed)
Patient ID: Juan [name], male   DOB: 1935-11-27, 83 y.o.   MRN: [id number]     Juan [name] is a 83 y.o.  male with a hx of CAD, s/p CABG in 2000, HTN, HL, deafness s/p cochlear implant. Last myoview 2015: inf-septal scar with mild peri-infarct ischemia (low risk). 2015 also had vasovagal syncope with carotids showing plaque no stenosis  And EF 50-55% by TTE with no valve disease   01/16/15 fell on stairs and had right displaced humeral fracture surgically repaired by Dr Juan [name] Still with poor ROM  Wife died 33 years ago Has two daughters that look after him He has a place in [place] but has not been in a while  Moved to [facility] in May 2019  and this seems to be working well for him   No cardiac issues needs flu shot Noted shuffling gait when he came to clinic today   ROS: Denies fever, malais, weight loss, blurry vision, decreased visual acuity, cough, sputum, SOB, hemoptysis, pleuritic pain, palpitaitons, heartburn, abdominal pain, melena, lower extremity edema, claudication, or rash.  All other systems reviewed and negative  General: Affect appropriate Healthy:  appears stated age 70: choclear implant  Neck supple with no adenopathy JVP normal no bruits no thyromegaly Lungs clear with no wheezing and good diaphragmatic motion Heart:  S1/S2 SEM  murmur, no rub, gallop or click PMI normal Abdomen: benighn, BS positve, no tenderness, no AAA no bruit.  No HSM or HJR Distal pulses intact with no bruits No edema Neuro non-focal Skin warm and dry Post humeral fracture    Current Outpatient Medications  Medication Sig Dispense Refill  . allopurinol (ZYLOPRIM) 100 MG tablet TAKE 1 TABLET BY MOUTH ONCE DAILY 90 tablet 1  . amLODipine (NORVASC) 2.5 MG tablet TAKE 1 TABLET(2.5 MG) BY MOUTH DAILY 90 tablet 1  . aspirin EC 81 MG tablet Take 81 mg by mouth daily.    . blood glucose meter kit and supplies KIT Dispense based on patient and insurance preference. Use up to four times  daily as directed. (FOR ICD-9 250.00, 250.01). 1 each 0  . Choline Fenofibrate (FENOFIBRIC ACID) 135 MG CPDR TAKE 1 CAPSULE BY MOUTH DAILY 90 capsule 1  . finasteride (PROSCAR) 5 MG tablet Take 5 mg by mouth daily.    . metFORMIN (GLUCOPHAGE) 500 MG tablet TAKE 1 TABLET(500 MG) BY MOUTH DAILY WITH BREAKFAST 90 tablet 1  . metoprolol tartrate (LOPRESSOR) 50 MG tablet TAKE 1/2 TABLET BY MOUTH TWICE DAILY 90 tablet 1  . pravastatin (PRAVACHOL) 20 MG tablet Take 1 tablet (20 mg total) by mouth 3 (three) times a week. 36 tablet 3   No current facility-administered medications for this visit.     Allergies  Simvastatin  Electrocardiogram:  02/06/18 SR rate 69 low voltage otherwise no acute changes 03/16/19 SR rate 75 poor R wave progression   Assessment and Plan  CAD/CABG:  Distant 2000 no angina continue medical Rx Myovue 1/15 with old IMI no ischemia EF normal  Syncope:  Non cardiac carotids plaque no stenosis  Chol:   Cholesterol is at goal.  Continue current dose of statin and diet Rx.   Lab Results  Component Value Date   LDLCALC 80 01/15/2019           Prostate:  F/U PSA Dr Juan [name] continue Proscar Neuro:  He shuffles quit a bit with gait will defer to primary if neuro w/u needed for Parkinson's   [facility]

## 2019-03-16 ENCOUNTER — Other Ambulatory Visit: Payer: Self-pay

## 2019-03-16 ENCOUNTER — Encounter: Payer: Self-pay | Admitting: Cardiovascular Disease

## 2019-03-16 ENCOUNTER — Ambulatory Visit: Payer: Federal, State, Local not specified - PPO | Admitting: Cardiovascular Disease

## 2019-03-16 VITALS — BP 140/72 | HR 96 | Ht 71.0 in | Wt 185.0 lb

## 2019-03-16 DIAGNOSIS — Z951 Presence of aortocoronary bypass graft: Secondary | ICD-10-CM

## 2019-03-16 NOTE — Patient Instructions (Signed)
Medication Instructions:   If you need a refill on your cardiac medications before your next appointment, please call your pharmacy.   Lab work:  If you have labs (blood work) drawn today and your tests are completely normal, you will receive your results only by: . MyChart Message (if you have MyChart) OR . A paper copy in the mail If you have any lab test that is abnormal or we need to change your treatment, we will call you to review the results.  Testing/Procedures: None ordered today.  Follow-Up: At [facility], you and your health needs are our priority.  As part of our continuing mission to provide you with exceptional heart care, we have created designated Provider Care Teams.  These Care Teams include your primary Cardiologist (physician) and Advanced Practice Providers (APPs -  Physician Assistants and Nurse Practitioners) who all work together to provide you with the care you need, when you need it. You will need a follow up appointment in 1 years.  Please call our office 2 months in advance to schedule this appointment.  You may see Peter Nishan, MD or one of the following Advanced Practice Providers on your designated Care Team:   Lori Gerhardt, NP Laura Ingold, NP . Jill McDaniel, NP    

## 2019-04-13 ENCOUNTER — Other Ambulatory Visit: Payer: Self-pay | Admitting: Internal Medicine

## 2019-05-26 ENCOUNTER — Other Ambulatory Visit: Payer: Self-pay

## 2019-05-26 MED ORDER — ALLOPURINOL 100 MG PO TABS: 100.0000 mg | ORAL_TABLET | Freq: Every day | ORAL | 1 refills | Status: DC

## 2019-06-07 ENCOUNTER — Encounter: Payer: Self-pay | Admitting: Internal Medicine

## 2019-07-15 ENCOUNTER — Other Ambulatory Visit: Payer: Self-pay | Admitting: Internal Medicine

## 2019-07-20 NOTE — Patient Instructions (Addendum)
  Blood work was ordered.     Medications reviewed and updated.  Changes include :   None     Please followup in 6 months   

## 2019-07-20 NOTE — Progress Notes (Signed)
Subjective:    Patient ID: Juan [name], male    DOB: 1935/07/19, 84 y.o.   MRN: [id number]  HPI The patient is here for follow up of their chronic medical problems, including hypertension, CAD, hyperlipidemia, diabetes, gout, CKD.  His daughter is her with him today.   He is taking all of his medications as prescribed.    He is not exercising regularly.    His daughter has heard a rattle in his chest sometimes when he walks.    He has no concerns.   Medications and allergies reviewed with patient and updated if appropriate.  Patient Active Problem List   Diagnosis Date Noted  . Diabetes mellitus without complication ([place]) 22/63/3354  . 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 Shuffling gait 12/29/2015  . Benign prostatic hyperplasia with urinary obstruction 08/28/2015  . Erectile dysfunction due to arterial insufficiency 08/28/2015  . Inguinal hernia, right s/p repair with mesh 09/15/15 08/28/2015  . Fracture, humerus, proximal 12/30/2014  . Dysplastic nevus 09/05/2014  . History of pancreatitis 06/25/2013  . CKD (chronic kidney disease) 06/24/2013  . Syncope 06/19/2013  . TIA (transient ischemic attack) 02/07/2012  . Essential hypertension, benign 03/02/2009  . DUODENITIS 03/01/2009  . DIVERTICULAR DISEASE 03/01/2009  . PERSONAL HX COLONIC POLYPS 03/19/2008  . History of gout 12/09/2007  . Coronary atherosclerosis 12/09/2007  . Mixed hyperlipidemia 06/04/2007    Current Outpatient Medications on File Prior to Visit  Medication Sig Dispense Refill  . allopurinol (ZYLOPRIM) 100 MG tablet Take 1 tablet (100 mg total) by mouth daily. 90 tablet 1  . amLODipine (NORVASC) 2.5 MG tablet TAKE 1 TABLET(2.5 MG) BY MOUTH DAILY 90 tablet 1  . aspirin EC 81 MG tablet Take 81 mg by mouth daily.    . blood glucose meter kit and supplies KIT Dispense based on patient and insurance preference. Use up to four times daily as directed. (FOR ICD-9 250.00, 250.01). 1 each 0   . Choline Fenofibrate (FENOFIBRIC ACID) 135 MG CPDR TAKE 1 CAPSULE BY MOUTH DAILY 90 capsule 1  . finasteride (PROSCAR) 5 MG tablet Take 5 mg by mouth daily.    . metFORMIN (GLUCOPHAGE) 500 MG tablet TAKE 1 TABLET(500 MG) BY MOUTH DAILY WITH BREAKFAST 90 tablet 0  . metoprolol tartrate (LOPRESSOR) 50 MG tablet TAKE 1/2 TABLET BY MOUTH TWICE DAILY 90 tablet 1  . pravastatin (PRAVACHOL) 20 MG tablet Take 1 tablet (20 mg total) by mouth 3 (three) times a week. 36 tablet 3   No current facility-administered medications on file prior to visit.    Past Medical History:  Diagnosis Date  . Cancer ([place])    skin  . CKD (chronic kidney disease) 06/24/2013  . Coronary artery disease    a. s/p CABG 2000; b. myoview 9/08: inf-septal scar with mild peri-infarct ischemia (low risk);  c.  Echo 8/13: mild focal basal septal hypertrophy, Gr 1 DD, mild LAE;  d. Lexiscan Myoview (06/20/2013): No reversible ischemia, inferior and septal infarct, inferior and septal hypokinesis, EF 60%;  e.  Echo (06/20/2011): EF 50-55%, normal wall motion, grade 1 diastolic dysfunction, mild LAE  . Diverticular disease   . Duodenitis   . Elevated PSA   . GERD (gastroesophageal reflux disease)   . Gout   . Humerus fracture    right  . Hydronephrosis with ureteropelvic junction obstruction   . Hypercalcemia 02/02/12   a. 01/2012: 10.7.  Marland Kitchen Hyperlipidemia   . Hypertension   . Pancreatitis  a. ? Simvastatin related  . Pneumonia age 85 or 28   "I think only once" (06/19/2013)  . Syncope    a. 06/2013  . TIA (transient ischemic attack)    a. 01/2012 - presentation as acute imbalance;  b. 01/2012 carotid U/S w/o signif stenosis;  c. 01/2012 Echo: EF 60-65%, Gr 1 DD.;  d.  Carotid US (06/20/2013): 1-39% bilateral ICA stenosis  . Tubular adenoma polyp of rectum   . Type II diabetes mellitus ([place])    "diet and exercise controlled at present" (06/19/2013)    Past Surgical History:  Procedure Laterality Date  . APPENDECTOMY    . CARDIAC  CATHETERIZATION  2000  . CHOLECYSTECTOMY    . COCHLEAR IMPLANT Right 1990's  . COLONOSCOPY W/ POLYPECTOMY     no polyp 2012  . CORONARY ARTERY BYPASS GRAFT  2000   CABG X5  . INGUINAL HERNIA REPAIR Right 09/15/2015   Procedure: OPEN REPAIR RIGHT INGUINAL HERNIA ;  Surgeon: Jackolyn Confer, MD;  Location[facility];  Service: General;  Laterality: Right;  . INSERTION OF MESH Right 09/15/2015   Procedure: INSERTION OF MESH;  Surgeon: Jackolyn Confer, MD;  Location[facility];  Service: General;  Laterality: Right;  . PARATHYROIDECTOMY  1978  . POLYPECTOMY    . REVERSE SHOULDER ARTHROPLASTY Right 12/30/2014   Procedure: Right shoulder ORIF, Biomet S3 Plate and ZImmer cable system ;  Surgeon: Netta Cedars, MD;  Location: [place];  Service: Orthopedics;  Laterality: Right;  . right ankle surgery  age 34  . TONSILLECTOMY  1943  . trachestomy as child for throat closing      Social History   Socioeconomic History  . Marital status: Widowed    Spouse name: Not on file  . Number of children: Not on file  . Years of education: Not on file  . Highest education level: Not on file  Occupational History  . Occupation: retired    Fish farm manager: RETIRED  Tobacco Use  . Smoking status: Current Some Day Smoker    Years: 0.00    Types: Cigars  . Smokeless tobacco: Never Used  . Tobacco comment: 06/19/2013 "rare cigar"  Substance and Sexual Activity  . Alcohol use: Yes    Comment: 12/29/14 "might have a drink once/month"  . Drug use: No  . Sexual activity: Not Currently  Other Topics Concern  . Not on file  Social History Narrative   Does get regular exercise   Social Determinants of Health   Financial Resource Strain:   . Difficulty of Paying Living Expenses: Not on file  Food Insecurity:   . Worried About Charity fundraiser in the Last Year: Not on file  . Ran Out of Food in the Last Year: Not on file  Transportation Needs:   . Lack of Transportation (Medical): Not on file  . Lack of  Transportation (Non-Medical): Not on file  Physical Activity:   . Days of Exercise per Week: Not on file  . Minutes of Exercise per Session: Not on file  Stress:   . Feeling of Stress : Not on file  Social Connections:   . Frequency of Communication with Friends and Family: Not on file  . Frequency of Social Gatherings with Friends and Family: Not on file  . Attends Religious Services: Not on file  . Active Member of Clubs or Organizations: Not on file  . Attends Archivist Meetings: Not on file  . Marital Status: Not on file  Family History  Problem Relation Age of Onset  . Melanoma Father   . Heart attack Father         in 7s  . Diabetes Neg Hx     Review of Systems  Constitutional: Negative for chills, fatigue and fever.  Respiratory: Positive for cough (a little at night). Negative for shortness of breath and wheezing.   Cardiovascular: Negative for chest pain, palpitations and leg swelling.  Gastrointestinal:       No gerd  Neurological: Negative for dizziness and headaches.       Objective:   Vitals:   07/21/19 0930  BP: 122/70  Pulse: 67  Resp: 16  Temp: 98 F (36.7 C)  SpO2: 96%   BP Readings from Last 3 Encounters:  07/21/19 122/70  03/16/19 140/72  01/15/19 124/80   Wt Readings from Last 3 Encounters:  07/21/19 183 lb (83 kg)  03/16/19 185 lb (83.9 kg)  01/15/19 189 lb (85.7 kg)   Body mass index is 25.52 kg/m.   Physical Exam    Constitutional: Appears well-developed and well-nourished. No distress.  HENT:  Head: Normocephalic and atraumatic.  Neck: Neck supple. No tracheal deviation present. No thyromegaly present.  No cervical lymphadenopathy Cardiovascular: Normal rate, regular rhythm and normal heart sounds.   No murmur heard. No carotid bruit .  No edema Pulmonary/Chest: Effort normal and breath sounds normal. No respiratory distress. No has no wheezes. No rales.  Skin: Skin is warm and dry. Not diaphoretic.  Neuro:  shuffling gait Psychiatric: Normal mood and affect. Behavior is normal.    Diabetic Foot Exam - Simple   Simple Foot Form Diabetic Foot exam was performed with the following findings: Yes 07/21/2019 10:03 AM  Visual Inspection No deformities, no ulcerations, no other skin breakdown bilaterally: Yes See comments: Yes Sensation Testing Intact to touch and monofilament testing bilaterally: Yes Pulse Check Posterior Tibialis and Dorsalis pulse intact bilaterally: Yes Comments B/l toenail onychomycosis, dry skin w/o breaks in skin      Assessment & Plan:    See Problem List for Assessment and Plan of chronic medical problems.    This visit occurred during the SARS-CoV-2 public health emergency.  Safety protocols were in place, including screening questions prior to the visit, additional usage of staff PPE, and extensive cleaning of exam room while observing appropriate contact time as indicated for disinfecting solutions.

## 2019-07-21 ENCOUNTER — Ambulatory Visit: Payer: Federal, State, Local not specified - PPO | Admitting: Internal Medicine

## 2019-07-21 ENCOUNTER — Other Ambulatory Visit: Payer: Self-pay

## 2019-07-21 ENCOUNTER — Encounter: Payer: Self-pay | Admitting: Internal Medicine

## 2019-07-21 VITALS — BP 122/70 | HR 67 | Temp 98.0°F | Resp 16 | Ht 71.0 in | Wt 183.0 lb

## 2019-07-21 DIAGNOSIS — I2510 Atherosclerotic heart disease of native coronary artery without angina pectoris: Secondary | ICD-10-CM

## 2019-07-21 DIAGNOSIS — E782 Mixed hyperlipidemia: Secondary | ICD-10-CM

## 2019-07-21 DIAGNOSIS — Z8739 Personal history of other diseases of the musculoskeletal system and connective tissue: Secondary | ICD-10-CM

## 2019-07-21 DIAGNOSIS — N189 Chronic kidney disease, unspecified: Secondary | ICD-10-CM

## 2019-07-21 DIAGNOSIS — R2689 Other abnormalities of gait and mobility: Secondary | ICD-10-CM

## 2019-07-21 DIAGNOSIS — E119 Type 2 diabetes mellitus without complications: Secondary | ICD-10-CM

## 2019-07-21 DIAGNOSIS — I10 Essential (primary) hypertension: Secondary | ICD-10-CM

## 2019-07-21 LAB — COMPREHENSIVE METABOLIC PANEL
ALT: 16 U/L (ref 0–53)
AST: 19 U/L (ref 0–37)
Albumin: 4.1 g/dL (ref 3.5–5.2)
Alkaline Phosphatase: 36 U/L — ABNORMAL LOW (ref 39–117)
BUN: 35 mg/dL — ABNORMAL HIGH (ref 6–23)
CO2: 24 mEq/L (ref 19–32)
Calcium: 10.3 mg/dL (ref 8.4–10.5)
Chloride: 107 mEq/L (ref 96–112)
Creatinine, Ser: 1.38 mg/dL (ref 0.40–1.50)
GFR: 49.4 mL/min — ABNORMAL LOW (ref >60.00)
Glucose, Bld: 140 mg/dL — ABNORMAL HIGH (ref 70–99)
Potassium: 4.1 mEq/L (ref 3.5–5.1)
Sodium: 139 mEq/L (ref 135–145)
Total Bilirubin: 0.5 mg/dL (ref 0.2–1.2)
Total Protein: 6.7 g/dL (ref 6.0–8.3)

## 2019-07-21 LAB — LIPID PANEL
Cholesterol: 153 mg/dL (ref 0–200)
HDL: 38.3 mg/dL — ABNORMAL LOW (ref >39.00)
LDL Cholesterol: 87 mg/dL (ref 0–99)
NonHDL: 114.99
Total CHOL/HDL Ratio: 4
Triglycerides: 142 mg/dL (ref 0.0–149.0)
VLDL: 28.4 mg/dL (ref 0.0–40.0)

## 2019-07-21 LAB — URIC ACID: Uric Acid, Serum: 7.6 mg/dL (ref 4.0–7.8)

## 2019-07-21 LAB — HEMOGLOBIN A1C: Hgb A1c MFr Bld: 6.7 % — ABNORMAL HIGH (ref 4.6–6.5)

## 2019-07-21 NOTE — Assessment & Plan Note (Signed)
Chronic BP well controlled Current regimen effective and well tolerated Continue current medications at current doses cmp  

## 2019-07-21 NOTE — Assessment & Plan Note (Signed)
Chronic Check lipid panel  Continue daily statin Regular exercise and healthy diet encouraged  

## 2019-07-21 NOTE — Assessment & Plan Note (Signed)
Chronic No concerning cp, sob or palpitations Continue ASA, statin, BB Cmp, lipids

## 2019-07-21 NOTE — Assessment & Plan Note (Signed)
Chronic Not drinking much fluids - encouraged more water cmp

## 2019-07-21 NOTE — Assessment & Plan Note (Signed)
chronic No falls recently Deferred neuro referral - concern for parkinsons

## 2019-07-21 NOTE — Assessment & Plan Note (Signed)
Chronic Denies gout symptoms since last visit Check uric acid level Continue allopurinol

## 2019-07-21 NOTE — Assessment & Plan Note (Signed)
Chronic Check a1c Continue metformin Low sugar / carb diet Stressed regular exercise

## 2019-08-07 ENCOUNTER — Telehealth: Payer: Self-pay

## 2019-08-07 MED ORDER — FENOFIBRIC ACID 135 MG PO CPDR: 1.0000 | DELAYED_RELEASE_CAPSULE | Freq: Every day | ORAL | 1 refills | Status: DC

## 2019-08-07 NOTE — Telephone Encounter (Signed)
Medication Requested:  Choline Fenofibrate (FENOFIBRIC ACID) 135 MG CPDR  Is medication on med list: Yes  (if no, inform pt they may need an appointment)  Is medication a controled: No   (yes = last OV with PCP)  -Controlled Substances: Adderall, Ritalin, oxycodone, hydrocodone, methadone, alprazolam, etc  Last visit with PCP: 2.2.2021   Is the OV > than 4 months: (yes = schedule an appt if one is not already made)  Pharmacy (Name, [place]): [facility] on [address] / [place]

## 2019-08-24 ENCOUNTER — Telehealth: Payer: Self-pay | Admitting: Internal Medicine

## 2019-08-24 DIAGNOSIS — I10 Essential (primary) hypertension: Secondary | ICD-10-CM

## 2019-08-24 MED ORDER — ALLOPURINOL 100 MG PO TABS: 100.0000 mg | ORAL_TABLET | Freq: Every day | ORAL | 1 refills | Status: DC

## 2019-08-24 MED ORDER — PRAVASTATIN SODIUM 20 MG PO TABS: 20.0000 mg | ORAL_TABLET | ORAL | 1 refills | Status: DC

## 2019-08-24 MED ORDER — AMLODIPINE BESYLATE 2.5 MG PO TABS: ORAL_TABLET | ORAL | 1 refills | Status: DC

## 2019-08-24 MED ORDER — METFORMIN HCL 500 MG PO TABS: ORAL_TABLET | ORAL | 1 refills | Status: DC

## 2019-08-24 MED ORDER — METOPROLOL TARTRATE 50 MG PO TABS: 25.0000 mg | ORAL_TABLET | Freq: Two times a day (BID) | ORAL | 1 refills | Status: DC

## 2019-08-24 NOTE — Telephone Encounter (Signed)
New Message:   1.Medication Requested: metoprolol tartrate (LOPRESSOR) 50 MG tablet pravastatin (PRAVACHOL) 20 MG tablet(Expired) metFORMIN (GLUCOPHAGE) 500 MG tablet amLODipine (NORVASC) 2.5 MG tablet allopurinol (ZYLOPRIM) 100 MG tablet 2. Pharmacy (Name, Street, [place], [place] - [address] AT [place]  3. On Med List: Yes  4. Last Visit with PCP: 07/21/19  5. Next visit date with PCP: 01/19/20   Agent: Please be advised that RX refills may take up to 3 business days. We ask that you follow-up with your pharmacy.

## 2019-08-24 NOTE — Telephone Encounter (Signed)
Rx filled

## 2019-09-07 ENCOUNTER — Other Ambulatory Visit: Payer: Self-pay | Admitting: Cardiovascular Disease

## 2019-09-07 ENCOUNTER — Telehealth: Payer: Self-pay | Admitting: Cardiovascular Disease

## 2019-09-07 MED ORDER — PRAVASTATIN SODIUM 20 MG PO TABS: 20.0000 mg | ORAL_TABLET | ORAL | 3 refills | Status: DC

## 2019-09-07 NOTE — Telephone Encounter (Signed)
Sent refill in for patient.

## 2019-09-07 NOTE — Telephone Encounter (Signed)
[facility] pharmacy is requesting a refill on pravastatin. Would Dr. Johnsie Cancel like to refill this medication? Please address

## 2019-12-25 ENCOUNTER — Other Ambulatory Visit: Payer: Self-pay | Admitting: Internal Medicine

## 2020-01-18 NOTE — Progress Notes (Signed)
Subjective:    Patient ID: Juan [name], male    DOB: 1936/03/21, 84 y.o.   MRN: [id number]  HPI The patient is here for follow up of their chronic medical problems, including CAD, htn, hyperlipidemia, DM, gout, ckd.  His daughter is here with him.  He is taking all of his medications as prescribed.   He is not exercising regularly.     He has no concerns.   Medications and allergies reviewed with patient and updated if appropriate.  Patient Active Problem List   Diagnosis Date Noted  . Diabetes mellitus without complication ([place]) 46/50/3546  . 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 Shuffling gait 12/29/2015  . Benign prostatic hyperplasia with urinary obstruction 08/28/2015  . Erectile dysfunction due to arterial insufficiency 08/28/2015  . Inguinal hernia, right s/p repair with mesh 09/15/15 08/28/2015  . Fracture, humerus, proximal 12/30/2014  . Dysplastic nevus 09/05/2014  . History of pancreatitis 06/25/2013  . CKD (chronic kidney disease) 06/24/2013  . Syncope 06/19/2013  . TIA (transient ischemic attack) 02/07/2012  . Essential hypertension, benign 03/02/2009  . DUODENITIS 03/01/2009  . DIVERTICULAR DISEASE 03/01/2009  . PERSONAL HX COLONIC POLYPS 03/19/2008  . History of gout 12/09/2007  . Coronary atherosclerosis 12/09/2007  . Mixed hyperlipidemia 06/04/2007    Current Outpatient Medications on File Prior to Visit  Medication Sig Dispense Refill  . allopurinol (ZYLOPRIM) 100 MG tablet TAKE 1 TABLET(100 MG) BY MOUTH DAILY 90 tablet 1  . amLODipine (NORVASC) 2.5 MG tablet TAKE 1 TABLET(2.5 MG) BY MOUTH DAILY 90 tablet 1  . aspirin EC 81 MG tablet Take 81 mg by mouth daily.    . blood glucose meter kit and supplies KIT Dispense based on patient and insurance preference. Use up to four times daily as directed. (FOR ICD-9 250.00, 250.01). 1 each 0  . Choline Fenofibrate (FENOFIBRIC ACID) 135 MG CPDR Take 1 capsule by mouth daily. 90 capsule 1  .  finasteride (PROSCAR) 5 MG tablet Take 5 mg by mouth daily.    . metFORMIN (GLUCOPHAGE) 500 MG tablet TAKE 1 TABLET(500 MG) BY MOUTH DAILY WITH BREAKFAST 90 tablet 1  . metoprolol tartrate (LOPRESSOR) 50 MG tablet Take 0.5 tablets (25 mg total) by mouth 2 (two) times daily. 90 tablet 1  . pravastatin (PRAVACHOL) 20 MG tablet Take 1 tablet (20 mg total) by mouth 3 (three) times a week. 36 tablet 3  . hydrocortisone 2.5 % cream Apply topically 2 (two) times daily as needed.     No current facility-administered medications on file prior to visit.    Past Medical History:  Diagnosis Date  . Cancer ([place])    skin  . CKD (chronic kidney disease) 06/24/2013  . Coronary artery disease    a. s/p CABG 2000; b. myoview 9/08: inf-septal scar with mild peri-infarct ischemia (low risk);  c.  Echo 8/13: mild focal basal septal hypertrophy, Gr 1 DD, mild LAE;  d. Lexiscan Myoview (06/20/2013): No reversible ischemia, inferior and septal infarct, inferior and septal hypokinesis, EF 60%;  e.  Echo (06/20/2011): EF 50-55%, normal wall motion, grade 1 diastolic dysfunction, mild LAE  . Diverticular disease   . Duodenitis   . Elevated PSA   . GERD (gastroesophageal reflux disease)   . Gout   . Humerus fracture    right  . Hydronephrosis with ureteropelvic junction obstruction   . Hypercalcemia 02/02/12   a. 01/2012: 10.7.  Marland Kitchen Hyperlipidemia   . Hypertension   .  Pancreatitis    a. ? Simvastatin related  . Pneumonia age 40 or 40   "I think only once" (06/19/2013)  . Syncope    a. 06/2013  . TIA (transient ischemic attack)    a. 01/2012 - presentation as acute imbalance;  b. 01/2012 carotid U/S w/o signif stenosis;  c. 01/2012 Echo: EF 60-65%, Gr 1 DD.;  d.  Carotid US (06/20/2013): 1-39% bilateral ICA stenosis  . Tubular adenoma polyp of rectum   . Type II diabetes mellitus ([place])    "diet and exercise controlled at present" (06/19/2013)    Past Surgical History:  Procedure Laterality Date  . APPENDECTOMY    . CARDIAC  CATHETERIZATION  2000  . CHOLECYSTECTOMY    . COCHLEAR IMPLANT Right 1990's  . COLONOSCOPY W/ POLYPECTOMY     no polyp 2012  . CORONARY ARTERY BYPASS GRAFT  2000   CABG X5  . INGUINAL HERNIA REPAIR Right 09/15/2015   Procedure: OPEN REPAIR RIGHT INGUINAL HERNIA ;  Surgeon: Jackolyn Confer, MD;  Location: [facility];  Service: General;  Laterality: Right;  . INSERTION OF MESH Right 09/15/2015   Procedure: INSERTION OF MESH;  Surgeon: Jackolyn Confer, MD;  Location: [facility];  Service: General;  Laterality: Right;  . PARATHYROIDECTOMY  1978  . POLYPECTOMY    . REVERSE SHOULDER ARTHROPLASTY Right 12/30/2014   Procedure: Right shoulder ORIF, Biomet S3 Plate and ZImmer cable system ;  Surgeon: Netta Cedars, MD;  Location: [facility];  Service: Orthopedics;  Laterality: Right;  . right ankle surgery  age 29  . TONSILLECTOMY  1943  . trachestomy as child for throat closing      Social History   Socioeconomic History  . Marital status: Widowed    Spouse name: Not on file  . Number of children: Not on file  . Years of education: Not on file  . Highest education level: Not on file  Occupational History  . Occupation: retired    Fish farm manager: RETIRED  Tobacco Use  . Smoking status: Current Some Day Smoker    Years: 0.00    Types: Cigars  . Smokeless tobacco: Never Used  . Tobacco comment: 06/19/2013 "rare cigar"  Substance and Sexual Activity  . Alcohol use: Yes    Comment: 12/29/14 "might have a drink once/month"  . Drug use: No  . Sexual activity: Not Currently  Other Topics Concern  . Not on file  Social History Narrative   Does get regular exercise   Social Determinants of Health   Financial Resource Strain:   . Difficulty of Paying Living Expenses:   Food Insecurity:   . Worried About Charity fundraiser in the Last Year:   . Arboriculturist in the Last Year:   Transportation Needs:   . Film/video editor (Medical):   Marland Kitchen Lack of Transportation (Non-Medical):   Physical Activity:   .  Days of Exercise per Week:   . Minutes of Exercise per Session:   Stress:   . Feeling of Stress :   Social Connections:   . Frequency of Communication with Friends and Family:   . Frequency of Social Gatherings with Friends and Family:   . Attends Religious Services:   . Active Member of Clubs or Organizations:   . Attends Archivist Meetings:   Marland Kitchen Marital Status:     Family History  Problem Relation Age of Onset  . Melanoma Father   . Heart attack Father  in 90s  . Diabetes Neg Hx     Review of Systems  Constitutional: Negative for appetite change, chills and fever.  Respiratory: Negative for cough, shortness of breath and wheezing.   Cardiovascular: Negative for chest pain, palpitations and leg swelling.  Gastrointestinal: Negative for abdominal pain and nausea.  Neurological: Negative for dizziness and headaches.  Psychiatric/Behavioral: Negative for sleep disturbance.       Objective:   Vitals:   01/19/20 0955  BP: 128/74  Pulse: 70  Temp: 98 F (36.7 C)  SpO2: 95%   BP Readings from Last 3 Encounters:  01/19/20 128/74  07/21/19 122/70  03/16/19 140/72   Wt Readings from Last 3 Encounters:  01/19/20 183 lb (83 kg)  07/21/19 183 lb (83 kg)  03/16/19 185 lb (83.9 kg)   Body mass index is 25.52 kg/m.   Physical Exam    Constitutional: Appears well-developed and well-nourished. No distress.  HENT:  Head: Normocephalic and atraumatic.  Neck: Neck supple. No tracheal deviation present. No thyromegaly present.  No cervical lymphadenopathy Cardiovascular: Normal rate, regular rhythm and normal heart sounds.   No murmur heard. No carotid bruit .  No edema Pulmonary/Chest: Effort normal and breath sounds normal. No respiratory distress. No has no wheezes. No rales.  Skin: Skin is warm and dry. Not diaphoretic.  Psychiatric: Normal mood and affect. Behavior is normal.      Assessment & Plan:    See Problem List for Assessment and Plan of  chronic medical problems.    This visit occurred during the SARS-CoV-2 public health emergency.  Safety protocols were in place, including screening questions prior to the visit, additional usage of staff PPE, and extensive cleaning of exam room while observing appropriate contact time as indicated for disinfecting solutions.

## 2020-01-18 NOTE — Patient Instructions (Addendum)
  Blood work was ordered.     Medications reviewed and updated.  Changes include :   none  Your prescription(s) have been submitted to your pharmacy. Please take as directed and contact our office if you believe you are having problem(s) with the medication(s).   Please followup in 6 months   

## 2020-01-19 ENCOUNTER — Ambulatory Visit: Payer: Federal, State, Local not specified - PPO | Admitting: Internal Medicine

## 2020-01-19 ENCOUNTER — Encounter: Payer: Self-pay | Admitting: Internal Medicine

## 2020-01-19 ENCOUNTER — Other Ambulatory Visit: Payer: Self-pay

## 2020-01-19 VITALS — BP 128/74 | HR 70 | Temp 98.0°F | Ht 71.0 in | Wt 183.0 lb

## 2020-01-19 DIAGNOSIS — N1831 Chronic kidney disease, stage 3a: Secondary | ICD-10-CM

## 2020-01-19 DIAGNOSIS — I10 Essential (primary) hypertension: Secondary | ICD-10-CM

## 2020-01-19 DIAGNOSIS — I2510 Atherosclerotic heart disease of native coronary artery without angina pectoris: Secondary | ICD-10-CM | POA: Diagnosis not present

## 2020-01-19 DIAGNOSIS — E119 Type 2 diabetes mellitus without complications: Secondary | ICD-10-CM | POA: Diagnosis not present

## 2020-01-19 DIAGNOSIS — E782 Mixed hyperlipidemia: Secondary | ICD-10-CM | POA: Diagnosis not present

## 2020-01-19 DIAGNOSIS — Z8739 Personal history of other diseases of the musculoskeletal system and connective tissue: Secondary | ICD-10-CM

## 2020-01-19 MED ORDER — FENOFIBRIC ACID 135 MG PO CPDR: 1.0000 | DELAYED_RELEASE_CAPSULE | Freq: Every day | ORAL | 1 refills | Status: DC

## 2020-01-19 MED ORDER — METOPROLOL TARTRATE 50 MG PO TABS: 25.0000 mg | ORAL_TABLET | Freq: Two times a day (BID) | ORAL | 1 refills | Status: DC

## 2020-01-19 MED ORDER — AMLODIPINE BESYLATE 2.5 MG PO TABS: ORAL_TABLET | ORAL | 1 refills | Status: DC

## 2020-01-19 MED ORDER — METFORMIN HCL 500 MG PO TABS: ORAL_TABLET | ORAL | 1 refills | Status: DC

## 2020-01-19 NOTE — Assessment & Plan Note (Signed)
Chronic BP well controlled Current regimen effective and well tolerated Continue current medications at current doses cmp  

## 2020-01-19 NOTE — Assessment & Plan Note (Signed)
Chronic Taking allopurinol 100 mg daily No gout since his last visit Continue current dose of allopurinol Will check uric acid level

## 2020-01-19 NOTE — Assessment & Plan Note (Signed)
Chronic Has remained stable CMP 

## 2020-01-19 NOTE — Assessment & Plan Note (Signed)
Chronic °Continue metformin °Check a1c °Low sugar / carb diet °Stressed regular exercise ° ° °

## 2020-01-19 NOTE — Assessment & Plan Note (Signed)
Chronic Check lipid panel  Continue daily statin Regular exercise and healthy diet encouraged  

## 2020-01-19 NOTE — Assessment & Plan Note (Signed)
Chronic No CP, palps, sob Continue current meds

## 2020-01-20 LAB — CBC WITH DIFFERENTIAL/PLATELET
Absolute Monocytes: 667 cells/uL (ref 200–950)
Basophils Absolute: 52 cells/uL (ref 0–200)
Basophils Relative: 0.9 %
Eosinophils Absolute: 302 cells/uL (ref 15–500)
Eosinophils Relative: 5.2 %
HCT: 40.3 % (ref 38.5–50.0)
Hemoglobin: 13.6 g/dL (ref 13.2–17.1)
Lymphs Abs: 1705 cells/uL (ref 850–3900)
MCH: 30.2 pg (ref 27.0–33.0)
MCHC: 33.7 g/dL (ref 32.0–36.0)
MCV: 89.4 fL (ref 80.0–100.0)
MPV: 8.7 fL (ref 7.5–12.5)
Monocytes Relative: 11.5 %
Neutro Abs: 3074 cells/uL (ref 1500–7800)
Neutrophils Relative %: 53 %
Platelets: 231 10*3/uL (ref 140–400)
RBC: 4.51 10*6/uL (ref 4.20–5.80)
RDW: 12.6 % (ref 11.0–15.0)
Total Lymphocyte: 29.4 %
WBC: 5.8 10*3/uL (ref 3.8–10.8)

## 2020-01-20 LAB — COMPLETE METABOLIC PANEL WITH GFR
AG Ratio: 1.6 (calc) (ref 1.0–2.5)
ALT: 14 U/L (ref 9–46)
AST: 18 U/L (ref 10–35)
Albumin: 4.2 g/dL (ref 3.6–5.1)
Alkaline phosphatase (APISO): 41 U/L (ref 35–144)
BUN/Creatinine Ratio: 28 (calc) — ABNORMAL HIGH (ref 6–22)
BUN: 39 mg/dL — ABNORMAL HIGH (ref 7–25)
CO2: 26 mmol/L (ref 20–32)
Calcium: 10.4 mg/dL — ABNORMAL HIGH (ref 8.6–10.3)
Chloride: 107 mmol/L (ref 98–110)
Creat: 1.38 mg/dL — ABNORMAL HIGH (ref 0.70–1.11)
GFR, Est African American: 55 mL/min/{1.73_m2} — ABNORMAL LOW (ref >=60)
GFR, Est Non African American: 48 mL/min/{1.73_m2} — ABNORMAL LOW (ref >=60)
Globulin: 2.6 g/dL (calc) (ref 1.9–3.7)
Glucose, Bld: 180 mg/dL — ABNORMAL HIGH (ref 65–99)
Potassium: 4.2 mmol/L (ref 3.5–5.3)
Sodium: 139 mmol/L (ref 135–146)
Total Bilirubin: 0.6 mg/dL (ref 0.2–1.2)
Total Protein: 6.8 g/dL (ref 6.1–8.1)

## 2020-01-20 LAB — HEMOGLOBIN A1C
Hgb A1c MFr Bld: 7.2 % of total Hgb — ABNORMAL HIGH (ref <5.7)
Mean Plasma Glucose: 160 (calc)
eAG (mmol/L): 8.9 (calc)

## 2020-01-20 LAB — LIPID PANEL
Cholesterol: 158 mg/dL (ref <200)
HDL: 37 mg/dL — ABNORMAL LOW (ref >=40)
LDL Cholesterol (Calc): 91 mg/dL (calc)
Non-HDL Cholesterol (Calc): 121 mg/dL (calc) (ref <130)
Total CHOL/HDL Ratio: 4.3 (calc) (ref <5.0)
Triglycerides: 198 mg/dL — ABNORMAL HIGH (ref <150)

## 2020-01-20 LAB — URIC ACID: Uric Acid, Serum: 7.8 mg/dL (ref 4.0–8.0)

## 2020-04-01 ENCOUNTER — Other Ambulatory Visit: Payer: Self-pay | Admitting: Internal Medicine

## 2020-04-04 MED ORDER — AMLODIPINE BESYLATE 2.5 MG PO TABS: ORAL_TABLET | ORAL | 1 refills | Status: DC

## 2020-04-04 NOTE — Addendum Note (Signed)
Addended by: Earnstine Regal on: 04/04/2020 12:28 PM   Modules accepted: Orders

## 2020-04-04 NOTE — Telephone Encounter (Signed)
Resent to pof../lmb 

## 2020-06-20 NOTE — Progress Notes (Signed)
Patient ID: Juan [name], male   DOB: 1935/10/24, 85 y.o.   MRN: [id number]     Juan [name] is a 85 y.o.  male with a hx of CAD, s/p CABG in 2000, HTN, HL, deafness s/p cochlear implant. Last myoview 2015: inf-septal scar with mild peri-infarct ischemia (low risk).  01/16/15 fell on stairs and had right displaced humeral fracture surgically repaired by Dr Veverly Fells Still with poor ROM  Wife died 85 years ago Has two daughters that look after him Moved to [facility] in May 2019  and this seems to be working well for him   Balance is poor and more shuffling gait  Daughter from [place] with him today   ROS: Denies fever, malais, weight loss, blurry vision, decreased visual acuity, cough, sputum, SOB, hemoptysis, pleuritic pain, palpitaitons, heartburn, abdominal pain, melena, lower extremity edema, claudication, or rash.  All other systems reviewed and negative  General: Affect appropriate Healthy:  appears stated age 44: choclear implant  Neck supple with no adenopathy JVP normal no bruits no thyromegaly Lungs clear with no wheezing and good diaphragmatic motion Heart:  S1/S2 SEM  murmur, no rub, gallop or click PMI normal Abdomen: benighn, BS positve, no tenderness, no AAA no bruit.  No HSM or HJR Distal pulses intact with no bruits No edema Neuro non-focal Skin warm and dry Post humeral fracture    Current Outpatient Medications  Medication Sig Dispense Refill  . allopurinol (ZYLOPRIM) 100 MG tablet TAKE 1 TABLET(100 MG) BY MOUTH DAILY 90 tablet 1  . amLODipine (NORVASC) 2.5 MG tablet TAKE 1 TABLET(2.5 MG) BY MOUTH DAILY 90 tablet 1  . aspirin EC 81 MG tablet Take 81 mg by mouth daily.    . blood glucose meter kit and supplies KIT Dispense based on patient and insurance preference. Use up to four times daily as directed. (FOR ICD-9 250.00, 250.01). 1 each 0  . Choline Fenofibrate (FENOFIBRIC ACID) 135 MG CPDR Take 1 capsule by mouth daily. 90 capsule 1  . finasteride  (PROSCAR) 5 MG tablet Take 5 mg by mouth daily.    . metFORMIN (GLUCOPHAGE) 500 MG tablet TAKE 1 TABLET(500 MG) BY MOUTH DAILY WITH BREAKFAST 90 tablet 1  . metoprolol tartrate (LOPRESSOR) 50 MG tablet Take 0.5 tablets (25 mg total) by mouth 2 (two) times daily. 90 tablet 1  . pravastatin (PRAVACHOL) 20 MG tablet Take 1 tablet (20 mg total) by mouth 3 (three) times a week. 36 tablet 3   No current facility-administered medications for this visit.    Allergies  Simvastatin  Electrocardiogram:  02/06/18 SR rate 69 low voltage otherwise no acute changes 03/16/19 SR rate 75 poor R wave progression 06/23/2020 SR rate 76 poor R wave progression stable   Assessment and Plan  CAD/CABG:  Distant 2000 no angina continue medical Rx Myovue 1/15 with old IMI no ischemia EF normal Given Age and lack of chest pain observe   Chol:  Given age LDL 60 acceptable on statin pravachol 20 mg daily   Prostate:  F/U PSA Dr Braulio Bosch continue Proscar  Neuro:  He shuffles quit a bit with gait will defer to primary if neuro w/u needed for Parkinson's   DM:  Discussed low carb diet.  Target hemoglobin A1c is 6.5 or less.  Continue current medications.  F/U in a year    Jenkins Rouge

## 2020-06-23 ENCOUNTER — Other Ambulatory Visit: Payer: Self-pay

## 2020-06-23 ENCOUNTER — Ambulatory Visit: Payer: Federal, State, Local not specified - PPO | Admitting: Cardiovascular Disease

## 2020-06-23 ENCOUNTER — Encounter: Payer: Self-pay | Admitting: Cardiovascular Disease

## 2020-06-23 VITALS — BP 116/72 | HR 76 | Ht 71.0 in | Wt 161.0 lb

## 2020-06-23 DIAGNOSIS — Z951 Presence of aortocoronary bypass graft: Secondary | ICD-10-CM

## 2020-06-23 NOTE — Patient Instructions (Signed)

## 2020-07-01 ENCOUNTER — Other Ambulatory Visit: Payer: Self-pay | Admitting: Internal Medicine

## 2020-07-21 ENCOUNTER — Encounter: Payer: Self-pay | Admitting: Internal Medicine

## 2020-08-02 LAB — HM DIABETES EYE EXAM

## 2020-08-03 ENCOUNTER — Other Ambulatory Visit: Payer: Self-pay | Admitting: Internal Medicine

## 2020-08-03 ENCOUNTER — Encounter: Payer: Self-pay | Admitting: Internal Medicine

## 2020-08-03 DIAGNOSIS — R2689 Other abnormalities of gait and mobility: Secondary | ICD-10-CM

## 2020-08-03 DIAGNOSIS — R5381 Other malaise: Secondary | ICD-10-CM

## 2020-08-22 NOTE — Patient Instructions (Addendum)
    Blood work was ordered.      Medications changes include :   none     Please followup in 6 months  

## 2020-08-22 NOTE — Progress Notes (Signed)
Subjective:    Patient ID: Juan [name], male    DOB: [birth year], 85 y.o.   MRN: [id number]  HPI The patient is here for follow up of their chronic medical problems, including CAD, htn, hyperlipidemia, DM, gout, CKD  Walking some.   Eating well.    He has no concerns.  Medications and allergies reviewed with patient and updated if appropriate.  Patient Active Problem List   Diagnosis Date Noted  . Diabetes mellitus without complication ([place]) 13/24/4010  . 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 Shuffling gait 12/29/2015  . Benign prostatic hyperplasia with urinary obstruction 08/28/2015  . Erectile dysfunction due to arterial insufficiency 08/28/2015  . Inguinal hernia, right s/p repair with mesh 09/15/15 08/28/2015  . Fracture, humerus, proximal 12/30/2014  . Dysplastic nevus 09/05/2014  . History of pancreatitis 06/25/2013  . CKD (chronic kidney disease) 06/24/2013  . Syncope 06/19/2013  . TIA (transient ischemic attack) 02/07/2012  . Essential hypertension, benign 03/02/2009  . DUODENITIS 03/01/2009  . DIVERTICULAR DISEASE 03/01/2009  . PERSONAL HX COLONIC POLYPS 03/19/2008  . History of gout 12/09/2007  . Coronary atherosclerosis 12/09/2007  . Mixed hyperlipidemia 06/04/2007    Current Outpatient Medications on File Prior to Visit  Medication Sig Dispense Refill  . allopurinol (ZYLOPRIM) 100 MG tablet TAKE 1 TABLET(100 MG) BY MOUTH DAILY 90 tablet 1  . amLODipine (NORVASC) 2.5 MG tablet TAKE 1 TABLET(2.5 MG) BY MOUTH DAILY 90 tablet 1  . aspirin EC 81 MG tablet Take 81 mg by mouth daily.    . blood glucose meter kit and supplies KIT Dispense based on patient and insurance preference. Use up to four times daily as directed. (FOR ICD-9 250.00, 250.01). 1 each 0  . Choline Fenofibrate (FENOFIBRIC ACID) 135 MG CPDR TAKE 1 CAPSULE BY MOUTH DAILY 90 capsule 1  . finasteride (PROSCAR) 5 MG tablet Take 5 mg by mouth daily.    . metFORMIN (GLUCOPHAGE) 500 MG  tablet TAKE 1 TABLET(500 MG) BY MOUTH DAILY WITH BREAKFAST 90 tablet 1  . metoprolol tartrate (LOPRESSOR) 50 MG tablet Take 0.5 tablets (25 mg total) by mouth 2 (two) times daily. 90 tablet 1  . pravastatin (PRAVACHOL) 20 MG tablet Take 1 tablet (20 mg total) by mouth 3 (three) times a week. 36 tablet 3   No current facility-administered medications on file prior to visit.    Past Medical History:  Diagnosis Date  . Cancer ([place])    skin  . CKD (chronic kidney disease) 06/24/2013  . Coronary artery disease    a. s/p CABG 2000; b. myoview 9/08: inf-septal scar with mild peri-infarct ischemia (low risk);  c.  Echo 8/13: mild focal basal septal hypertrophy, Gr 1 DD, mild LAE;  d. Lexiscan Myoview (06/20/2013): No reversible ischemia, inferior and septal infarct, inferior and septal hypokinesis, EF 60%;  e.  Echo (06/20/2011): EF 50-55%, normal wall motion, grade 1 diastolic dysfunction, mild LAE  . Diverticular disease   . Duodenitis   . Elevated PSA   . GERD (gastroesophageal reflux disease)   . Gout   . Humerus fracture    right  . Hydronephrosis with ureteropelvic junction obstruction   . Hypercalcemia 02/02/12   a. 01/2012: 10.7.  Marland Kitchen Hyperlipidemia   . Hypertension   . Pancreatitis    a. ? Simvastatin related  . Pneumonia age 68 or 30   "I think only once" (06/19/2013)  . Syncope    a. 06/2013  . TIA (transient ischemic  attack)    a. 01/2012 - presentation as acute imbalance;  b. 01/2012 carotid U/S w/o signif stenosis;  c. 01/2012 Echo: EF 60-65%, Gr 1 DD.;  d.  Carotid US (06/20/2013): 1-39% bilateral ICA stenosis  . Tubular adenoma polyp of rectum   . Type II diabetes mellitus ([facility])    "diet and exercise controlled at present" (06/19/2013)    Past Surgical History:  Procedure Laterality Date  . APPENDECTOMY    . CARDIAC CATHETERIZATION  2000  . CHOLECYSTECTOMY    . COCHLEAR IMPLANT Right 1990's  . COLONOSCOPY W/ POLYPECTOMY     no polyp 2012  . CORONARY ARTERY BYPASS GRAFT  2000   CABG  X5  . INGUINAL HERNIA REPAIR Right 09/15/2015   Procedure: OPEN REPAIR RIGHT INGUINAL HERNIA ;  Surgeon: Avel Peace, MD;  Location: [facility];  Service: General;  Laterality: Right;  . INSERTION OF MESH Right 09/15/2015   Procedure: INSERTION OF MESH;  Surgeon: Avel Peace, MD;  Location: [facility];  Service: General;  Laterality: Right;  . PARATHYROIDECTOMY  1978  . POLYPECTOMY    . REVERSE SHOULDER ARTHROPLASTY Right 12/30/2014   Procedure: Right shoulder ORIF, Biomet S3 Plate and ZImmer cable system ;  Surgeon: Beverely Low, MD;  [facility];  Service: Orthopedics;  Laterality: Right;  . right ankle surgery  age 68  . TONSILLECTOMY  1943  . trachestomy as child for throat closing      Social History   Socioeconomic History  . Marital status: Widowed    Spouse name: Not on file  . Number of children: Not on file  . Years of education: Not on file  . Highest education level: Not on file  Occupational History  . Occupation: retired    Associate Professor: RETIRED  Tobacco Use  . Smoking status: Current Some Day Smoker    Years: 0.00    Types: Cigars  . Smokeless tobacco: Never Used  . Tobacco comment: 06/19/2013 "rare cigar"  Substance and Sexual Activity  . Alcohol use: Yes    Comment: 12/29/14 "might have a drink once/month"  . Drug use: No  . Sexual activity: Not Currently  Other Topics Concern  . Not on file  Social History Narrative   Does get regular exercise   Social Determinants of Health   Financial Resource Strain: Not on file  Food Insecurity: Not on file  Transportation Needs: Not on file  Physical Activity: Not on file  Stress: Not on file  Social Connections: Not on file    Family History  Problem Relation Age of Onset  . Melanoma Father   . Heart attack Father         in 90s  . Diabetes Neg Hx     Review of Systems  Constitutional: Negative for appetite change, chills and fever.  Respiratory: Negative for cough, shortness of breath and wheezing.    Cardiovascular: Negative for chest pain, palpitations and leg swelling.  Gastrointestinal: Negative for abdominal pain.  Neurological: Negative for dizziness, light-headedness and headaches.       Objective:   Vitals:   08/23/20 1122  BP: 124/78  Pulse: 68  Temp: 98 F (36.7 C)  SpO2: 97%   BP Readings from Last 3 Encounters:  08/23/20 124/78  06/23/20 116/72  01/19/20 128/74   Wt Readings from Last 3 Encounters:  08/23/20 176 lb (79.8 kg)  06/23/20 161 lb (73 kg)  01/19/20 183 lb (83 kg)   Body mass index is 24.55  kg/m.   Physical Exam    Constitutional: Appears well-developed and well-nourished. No distress.  HENT:  Head: Normocephalic and atraumatic.  Neck: Neck supple. No tracheal deviation present. No thyromegaly present.  No cervical lymphadenopathy Cardiovascular: Normal rate, regular rhythm and normal heart sounds.   No murmur heard. No carotid bruit .  No edema Pulmonary/Chest: Effort normal and breath sounds normal. No respiratory distress. No has no wheezes. No rales. Neurological: Slow, shuffling gait-chronic and unchanged Skin: Skin is warm and dry. Not diaphoretic.  Psychiatric: Normal mood and affect. Behavior is normal.      Assessment & Plan:    See Problem List for Assessment and Plan of chronic medical problems.    This visit occurred during the SARS-CoV-2 public health emergency.  Safety protocols were in place, including screening questions prior to the visit, additional usage of staff PPE, and extensive cleaning of exam room while observing appropriate contact time as indicated for disinfecting solutions.

## 2020-08-23 ENCOUNTER — Ambulatory Visit: Payer: Federal, State, Local not specified - PPO | Admitting: Internal Medicine

## 2020-08-23 ENCOUNTER — Encounter: Payer: Self-pay | Admitting: Internal Medicine

## 2020-08-23 ENCOUNTER — Other Ambulatory Visit: Payer: Self-pay

## 2020-08-23 VITALS — BP 124/78 | HR 68 | Temp 98.0°F | Ht 71.0 in | Wt 176.0 lb

## 2020-08-23 DIAGNOSIS — I10 Essential (primary) hypertension: Secondary | ICD-10-CM

## 2020-08-23 DIAGNOSIS — I2510 Atherosclerotic heart disease of native coronary artery without angina pectoris: Secondary | ICD-10-CM

## 2020-08-23 DIAGNOSIS — N1831 Chronic kidney disease, stage 3a: Secondary | ICD-10-CM

## 2020-08-23 DIAGNOSIS — E119 Type 2 diabetes mellitus without complications: Secondary | ICD-10-CM | POA: Diagnosis not present

## 2020-08-23 DIAGNOSIS — E782 Mixed hyperlipidemia: Secondary | ICD-10-CM

## 2020-08-23 DIAGNOSIS — Z8739 Personal history of other diseases of the musculoskeletal system and connective tissue: Secondary | ICD-10-CM

## 2020-08-23 LAB — CBC WITH DIFFERENTIAL/PLATELET
Basophils Absolute: 0.1 10*3/uL (ref 0.0–0.1)
Basophils Relative: 1.1 % (ref 0.0–3.0)
Eosinophils Absolute: 0.3 10*3/uL (ref 0.0–0.7)
Eosinophils Relative: 6 % — ABNORMAL HIGH (ref 0.0–5.0)
HCT: 38.5 % — ABNORMAL LOW (ref 39.0–52.0)
Hemoglobin: 13 g/dL (ref 13.0–17.0)
Lymphocytes Relative: 27.4 % (ref 12.0–46.0)
Lymphs Abs: 1.5 10*3/uL (ref 0.7–4.0)
MCHC: 33.8 g/dL (ref 30.0–36.0)
MCV: 87.6 fl (ref 78.0–100.0)
Monocytes Absolute: 0.6 10*3/uL (ref 0.1–1.0)
Monocytes Relative: 11.3 % (ref 3.0–12.0)
Neutro Abs: 3.1 10*3/uL (ref 1.4–7.7)
Neutrophils Relative %: 54.2 % (ref 43.0–77.0)
Platelets: 264 10*3/uL (ref 150.0–400.0)
RBC: 4.39 Mil/uL (ref 4.22–5.81)
RDW: 14.4 % (ref 11.5–15.5)
WBC: 5.6 10*3/uL (ref 4.0–10.5)

## 2020-08-23 LAB — COMPREHENSIVE METABOLIC PANEL
ALT: 19 U/L (ref 0–53)
AST: 22 U/L (ref 0–37)
Albumin: 4 g/dL (ref 3.5–5.2)
Alkaline Phosphatase: 36 U/L — ABNORMAL LOW (ref 39–117)
BUN: 32 mg/dL — ABNORMAL HIGH (ref 6–23)
CO2: 29 mEq/L (ref 19–32)
Calcium: 10.6 mg/dL — ABNORMAL HIGH (ref 8.4–10.5)
Chloride: 105 mEq/L (ref 96–112)
Creatinine, Ser: 1.34 mg/dL (ref 0.40–1.50)
GFR: 49.32 mL/min — ABNORMAL LOW (ref >60.00)
Glucose, Bld: 134 mg/dL — ABNORMAL HIGH (ref 70–99)
Potassium: 4.3 mEq/L (ref 3.5–5.1)
Sodium: 138 mEq/L (ref 135–145)
Total Bilirubin: 0.5 mg/dL (ref 0.2–1.2)
Total Protein: 6.9 g/dL (ref 6.0–8.3)

## 2020-08-23 LAB — LIPID PANEL
Cholesterol: 154 mg/dL (ref 0–200)
HDL: 38.4 mg/dL — ABNORMAL LOW (ref >39.00)
NonHDL: 116.05
Total CHOL/HDL Ratio: 4
Triglycerides: 202 mg/dL — ABNORMAL HIGH (ref 0.0–149.0)
VLDL: 40.4 mg/dL — ABNORMAL HIGH (ref 0.0–40.0)

## 2020-08-23 LAB — HEMOGLOBIN A1C: Hgb A1c MFr Bld: 6.7 % — ABNORMAL HIGH (ref 4.6–6.5)

## 2020-08-23 LAB — LDL CHOLESTEROL, DIRECT: Direct LDL: 96 mg/dL

## 2020-08-23 NOTE — Assessment & Plan Note (Signed)
Chronic Check lipids, CMP Continue pravastatin 20 mg 3 times a week Encouraged regular exercise and healthy diet

## 2020-08-23 NOTE — Assessment & Plan Note (Signed)
Chronic No symptoms suggestive of angina Continue aspirin 81 mg daily, metoprolol 25 mg twice daily and pravastatin 20 mg 3 times a week

## 2020-08-23 NOTE — Assessment & Plan Note (Signed)
Chronic Controlled Continue amlodipine 2.5 mg daily, metoprolol 25 mg twice daily

## 2020-08-23 NOTE — Assessment & Plan Note (Signed)
Chronic Continue allopurinol 100 mg daily No symptoms of gout since his last visit

## 2020-08-23 NOTE — Assessment & Plan Note (Signed)
Chronic Check A1c Continue Metformin 500 mg daily-can adjust if necessary Encouraged regular walking States he is compliant with a diabetic diet

## 2020-08-23 NOTE — Assessment & Plan Note (Signed)
Chronic States he is drinking fluids throughout the day CMP

## 2020-08-26 ENCOUNTER — Other Ambulatory Visit: Payer: Self-pay | Admitting: Internal Medicine

## 2020-08-26 DIAGNOSIS — I10 Essential (primary) hypertension: Secondary | ICD-10-CM

## 2020-09-27 ENCOUNTER — Other Ambulatory Visit: Payer: Self-pay | Admitting: Internal Medicine

## 2020-10-03 ENCOUNTER — Other Ambulatory Visit: Payer: Self-pay | Admitting: Internal Medicine

## 2020-10-06 NOTE — Progress Notes (Deleted)
Subjective:    Patient ID: Juan [name], male    DOB: 01/19/1936, 85 y.o.   MRN: [id number]  HPI The patient is here for an acute visit.   Pain in feet, ? Gout:    Medications and allergies reviewed with patient and updated if appropriate.  Patient Active Problem List   Diagnosis Date Noted  . Diabetes mellitus without complication ([place]) 74/16/3845  . 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 Shuffling gait 12/29/2015  . Benign prostatic hyperplasia with urinary obstruction 08/28/2015  . Erectile dysfunction due to arterial insufficiency 08/28/2015  . Inguinal hernia, right s/p repair with mesh 09/15/15 08/28/2015  . Fracture, humerus, proximal 12/30/2014  . Dysplastic nevus 09/05/2014  . History of pancreatitis 06/25/2013  . CKD (chronic kidney disease) 06/24/2013  . Syncope 06/19/2013  . TIA (transient ischemic attack) 02/07/2012  . Essential hypertension, benign 03/02/2009  . DUODENITIS 03/01/2009  . DIVERTICULAR DISEASE 03/01/2009  . PERSONAL HX COLONIC POLYPS 03/19/2008  . History of gout 12/09/2007  . Coronary atherosclerosis 12/09/2007  . Mixed hyperlipidemia 06/04/2007    Current Outpatient Medications on File Prior to Visit  Medication Sig Dispense Refill  . allopurinol (ZYLOPRIM) 100 MG tablet TAKE 1 TABLET(100 MG) BY MOUTH DAILY 90 tablet 1  . amLODipine (NORVASC) 2.5 MG tablet TAKE 1 TABLET(2.5 MG) BY MOUTH DAILY 90 tablet 1  . aspirin EC 81 MG tablet Take 81 mg by mouth daily.    . blood glucose meter kit and supplies KIT Dispense based on patient and insurance preference. Use up to four times daily as directed. (FOR ICD-9 250.00, 250.01). 1 each 0  . Choline Fenofibrate (FENOFIBRIC ACID) 135 MG CPDR TAKE 1 CAPSULE BY MOUTH DAILY 90 capsule 1  . finasteride (PROSCAR) 5 MG tablet Take 5 mg by mouth daily.    . metFORMIN (GLUCOPHAGE) 500 MG tablet TAKE 1 TABLET(500 MG) BY MOUTH DAILY WITH BREAKFAST 90 tablet 1  . metoprolol tartrate (LOPRESSOR) 50  MG tablet TAKE 1/2 TABLET(25 MG) BY MOUTH TWICE DAILY 90 tablet 1  . pravastatin (PRAVACHOL) 20 MG tablet Take 1 tablet (20 mg total) by mouth 3 (three) times a week. 36 tablet 3   No current facility-administered medications on file prior to visit.    Past Medical History:  Diagnosis Date  . Cancer ([place])    skin  . CKD (chronic kidney disease) 06/24/2013  . Coronary artery disease    a. s/p CABG 2000; b. myoview 9/08: inf-septal scar with mild peri-infarct ischemia (low risk);  c.  Echo 8/13: mild focal basal septal hypertrophy, Gr 1 DD, mild LAE;  d. Lexiscan Myoview (06/20/2013): No reversible ischemia, inferior and septal infarct, inferior and septal hypokinesis, EF 60%;  e.  Echo (06/20/2011): EF 50-55%, normal wall motion, grade 1 diastolic dysfunction, mild LAE  . Diverticular disease   . Duodenitis   . Elevated PSA   . GERD (gastroesophageal reflux disease)   . Gout   . Humerus fracture    right  . Hydronephrosis with ureteropelvic junction obstruction   . Hypercalcemia 02/02/12   a. 01/2012: 10.7.  Marland Kitchen Hyperlipidemia   . Hypertension   . Pancreatitis    a. ? Simvastatin related  . Pneumonia age 57 or 75   "I think only once" (06/19/2013)  . Syncope    a. 06/2013  . TIA (transient ischemic attack)    a. 01/2012 - presentation as acute imbalance;  b. 01/2012 carotid U/S w/o signif stenosis;  c. 01/2012 Echo: EF 60-65%, Gr 1 DD.;  d.  Carotid US (06/20/2013): 1-39% bilateral ICA stenosis  . Tubular adenoma polyp of rectum   . Type II diabetes mellitus ([place])    "diet and exercise controlled at present" (06/19/2013)    Past Surgical History:  Procedure Laterality Date  . APPENDECTOMY    . CARDIAC CATHETERIZATION  2000  . CHOLECYSTECTOMY    . COCHLEAR IMPLANT Right 1990's  . COLONOSCOPY W/ POLYPECTOMY     no polyp 2012  . CORONARY ARTERY BYPASS GRAFT  2000   CABG X5  . INGUINAL HERNIA REPAIR Right 09/15/2015   Procedure: OPEN REPAIR RIGHT INGUINAL HERNIA ;  Surgeon: Jackolyn Confer, MD;   Location: [facility];  Service: General;  Laterality: Right;  . INSERTION OF MESH Right 09/15/2015   Procedure: INSERTION OF MESH;  Surgeon: Jackolyn Confer, MD;  Location: [facility];  Service: General;  Laterality: Right;  . PARATHYROIDECTOMY  1978  . POLYPECTOMY    . REVERSE SHOULDER ARTHROPLASTY Right 12/30/2014   Procedure: Right shoulder ORIF, Biomet S3 Plate and ZImmer cable system ;  Surgeon: Netta Cedars, MD;  Location: [facility];  Service: Orthopedics;  Laterality: Right;  . right ankle surgery  age 83  . TONSILLECTOMY  1943  . trachestomy as child for throat closing      Social History   Socioeconomic History  . Marital status: Widowed    Spouse name: Not on file  . Number of children: Not on file  . Years of education: Not on file  . Highest education level: Not on file  Occupational History  . Occupation: retired    Fish farm manager: RETIRED  Tobacco Use  . Smoking status: Former Smoker    Years: 0.00    Types: Cigars  . Smokeless tobacco: Never Used  . Tobacco comment: 06/19/2013 "rare cigar"  Substance and Sexual Activity  . Alcohol use: Yes    Comment: 12/29/14 "might have a drink once/month"  . Drug use: No  . Sexual activity: Not Currently    Birth control/protection: Injection  Other Topics Concern  . Not on file  Social History Narrative   Does get regular exercise   Social Determinants of Health   Financial Resource Strain: Not on file  Food Insecurity: Not on file  Transportation Needs: Not on file  Physical Activity: Not on file  Stress: Not on file  Social Connections: Not on file    Family History  Problem Relation Age of Onset  . Melanoma Father   . Heart attack Father         in 3s  . Diabetes Neg Hx     Review of Systems     Objective:  There were no vitals filed for this visit. BP Readings from Last 3 Encounters:  08/23/20 124/78  06/23/20 116/72  01/19/20 128/74   Wt Readings from Last 3 Encounters:  08/23/20 176 lb (79.8 kg)  06/23/20 161 lb  (73 kg)  01/19/20 183 lb (83 kg)   There is no height or weight on file to calculate BMI.   Physical Exam         Assessment & Plan:    See Problem List for Assessment and Plan of chronic medical problems.    This visit occurred during the SARS-CoV-2 public health emergency.  Safety protocols were in place, including screening questions prior to the visit, additional usage of staff PPE, and extensive cleaning of exam room while observing appropriate contact time as  indicated for disinfecting solutions.

## 2020-10-07 ENCOUNTER — Ambulatory Visit: Payer: Federal, State, Local not specified - PPO | Admitting: Internal Medicine

## 2020-10-13 ENCOUNTER — Encounter: Payer: Self-pay | Admitting: Emergency Medicine

## 2020-10-13 ENCOUNTER — Ambulatory Visit: Payer: Federal, State, Local not specified - PPO | Admitting: Emergency Medicine

## 2020-10-13 ENCOUNTER — Other Ambulatory Visit: Payer: Self-pay

## 2020-10-13 ENCOUNTER — Ambulatory Visit (INDEPENDENT_AMBULATORY_CARE_PROVIDER_SITE_OTHER): Payer: Federal, State, Local not specified - PPO

## 2020-10-13 VITALS — BP 122/62 | HR 71 | Temp 97.7°F | Ht 68.0 in | Wt 193.8 lb

## 2020-10-13 DIAGNOSIS — R269 Unspecified abnormalities of gait and mobility: Secondary | ICD-10-CM | POA: Diagnosis not present

## 2020-10-13 DIAGNOSIS — S8002XA Contusion of left knee, initial encounter: Secondary | ICD-10-CM

## 2020-10-13 DIAGNOSIS — R0989 Other specified symptoms and signs involving the circulatory and respiratory systems: Secondary | ICD-10-CM

## 2020-10-13 DIAGNOSIS — R296 Repeated falls: Secondary | ICD-10-CM

## 2020-10-13 DIAGNOSIS — S40022A Contusion of left upper arm, initial encounter: Secondary | ICD-10-CM

## 2020-10-13 NOTE — Progress Notes (Signed)
Juan [name] 85 y.o.   Chief Complaint  Patient presents with  . Fall    Has fallen 4 times in the 2 weeks    HISTORY OF PRESENT ILLNESS: This is a 85 y.o. male brought in by daughter who states patient has fallen 4 times in the past 2 weeks.  No syncopal episodes. Injured left arm and left knee with swelling and minimal pain.  Daughter concerned because in the past he had broken right arm with minimal symptoms. She also states visiting nurse this morning detected "rales" on left side of his lungs.  No flulike symptoms.  No fever or chills.  No cough or difficulty breathing. Presently using a cane. No other injuries or significant symptoms. No recent change of medications or diet. No head injury or altered mental status.  HPI   Prior to Admission medications   Medication Sig Start Date End Date Taking? Authorizing Provider  allopurinol (ZYLOPRIM) 100 MG tablet TAKE 1 TABLET(100 MG) BY MOUTH DAILY 07/01/20   Binnie Rail, MD  amLODipine (NORVASC) 2.5 MG tablet TAKE 1 TABLET(2.5 MG) BY MOUTH DAILY 10/04/20   Binnie Rail, MD  aspirin EC 81 MG tablet Take 81 mg by mouth daily.    [provider]  blood glucose meter kit and supplies KIT Dispense based on patient and insurance preference. Use up to four times daily as directed. (FOR ICD-9 250.00, 250.01). 01/04/17   Golden Circle, FNP  Choline Fenofibrate (FENOFIBRIC ACID) 135 MG CPDR TAKE 1 CAPSULE BY MOUTH DAILY 08/03/20   Binnie Rail, MD  finasteride (PROSCAR) 5 MG tablet Take 5 mg by mouth daily.    [provider]  metFORMIN (GLUCOPHAGE) 500 MG tablet TAKE 1 TABLET(500 MG) BY MOUTH DAILY WITH BREAKFAST 09/27/20   Burns, Claudina Lick, MD  metoprolol tartrate (LOPRESSOR) 50 MG tablet TAKE 1/2 TABLET(25 MG) BY MOUTH TWICE DAILY 08/26/20   Binnie Rail, MD  pravastatin (PRAVACHOL) 20 MG tablet Take 1 tablet (20 mg total) by mouth 3 (three) times a week. 09/07/19   [name], MD    Allergies  Allergen  Reactions  . Simvastatin     REACTION:  Possible statin induced pancreatitis    Patient Active Problem List   Diagnosis Date Noted  . Diabetes mellitus without complication ([place]) 44/31/5400  . 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 Shuffling gait 12/29/2015  . Benign prostatic hyperplasia with urinary obstruction 08/28/2015  . Erectile dysfunction due to arterial insufficiency 08/28/2015  . Inguinal hernia, right s/p repair with mesh 09/15/15 08/28/2015  . Dysplastic nevus 09/05/2014  . History of pancreatitis 06/25/2013  . CKD (chronic kidney disease) 06/24/2013  . TIA (transient ischemic attack) 02/07/2012  . Essential hypertension, benign 03/02/2009  . DIVERTICULAR DISEASE 03/01/2009  . PERSONAL HX COLONIC POLYPS 03/19/2008  . History of gout 12/09/2007  . Coronary atherosclerosis 12/09/2007  . Mixed hyperlipidemia 06/04/2007    Past Medical History:  Diagnosis Date  . Cancer ([place])    skin  . CKD (chronic kidney disease) 06/24/2013  . Coronary artery disease    a. s/p CABG 2000; b. myoview 9/08: inf-septal scar with mild peri-infarct ischemia (low risk);  c.  Echo 8/13: mild focal basal septal hypertrophy, Gr 1 DD, mild LAE;  d. Lexiscan Myoview (06/20/2013): No reversible ischemia, inferior and septal infarct, inferior and septal hypokinesis, EF 60%;  e.  Echo (06/20/2011): EF 50-55%, normal wall motion, grade 1 diastolic dysfunction, mild LAE  .  Diverticular disease   . Duodenitis   . Elevated PSA   . GERD (gastroesophageal reflux disease)   . Gout   . Humerus fracture    right  . Hydronephrosis with ureteropelvic junction obstruction   . Hypercalcemia 02/02/12   a. 01/2012: 10.7.  Marland Kitchen Hyperlipidemia   . Hypertension   . Pancreatitis    a. ? Simvastatin related  . Pneumonia age 22 or 87   "I think only once" (06/19/2013)  . Syncope    a. 06/2013  . TIA (transient ischemic attack)    a. 01/2012 - presentation as acute imbalance;  b. 01/2012 carotid U/S w/o signif  stenosis;  c. 01/2012 Echo: EF 60-65%, Gr 1 DD.;  d.  Carotid US (06/20/2013): 1-39% bilateral ICA stenosis  . Tubular adenoma polyp of rectum   . Type II diabetes mellitus ([place])    "diet and exercise controlled at present" (06/19/2013)    Past Surgical History:  Procedure Laterality Date  . APPENDECTOMY    . CARDIAC CATHETERIZATION  2000  . CHOLECYSTECTOMY    . COCHLEAR IMPLANT Right 1990's  . COLONOSCOPY W/ POLYPECTOMY     no polyp 2012  . CORONARY ARTERY BYPASS GRAFT  2000   CABG X5  . INGUINAL HERNIA REPAIR Right 09/15/2015   Procedure: OPEN REPAIR RIGHT INGUINAL HERNIA ;  Surgeon: Jackolyn Confer, MD;  [facility];  Service: General;  Laterality: Right;  . INSERTION OF MESH Right 09/15/2015   Procedure: INSERTION OF MESH;  Surgeon: Jackolyn Confer, MD;  [facility];  Service: General;  Laterality: Right;  . PARATHYROIDECTOMY  1978  . POLYPECTOMY    . REVERSE SHOULDER ARTHROPLASTY Right 12/30/2014   Procedure: Right shoulder ORIF, Biomet S3 Plate and ZImmer cable system ;  Surgeon: Netta Cedars, MD;  Location: [facility];  Service: Orthopedics;  Laterality: Right;  . right ankle surgery  age 5  . TONSILLECTOMY  1943  . trachestomy as child for throat closing      Social History   Socioeconomic History  . Marital status: Widowed    Spouse name: Not on file  . Number of children: Not on file  . Years of education: Not on file  . Highest education level: Not on file  Occupational History  . Occupation: retired    Fish farm manager: RETIRED  Tobacco Use  . Smoking status: Former Smoker    Years: 0.00    Types: Cigars  . Smokeless tobacco: Never Used  . Tobacco comment: 06/19/2013 "rare cigar"  Substance and Sexual Activity  . Alcohol use: Yes    Comment: 12/29/14 "might have a drink once/month"  . Drug use: No  . Sexual activity: Not Currently    Birth control/protection: Injection  Other Topics Concern  . Not on file  Social History Narrative   Does get regular exercise    Social Determinants of Health   Financial Resource Strain: Not on file  Food Insecurity: Not on file  Transportation Needs: Not on file  Physical Activity: Not on file  Stress: Not on file  Social Connections: Not on file  Intimate Partner Violence: Not on file    Family History  Problem Relation Age of Onset  . Melanoma Father   . Heart attack Father         in 53s  . Diabetes Neg Hx      Review of Systems  Constitutional: Negative.  Negative for chills and fever.  HENT: Negative.  Negative for congestion and sore  throat.   Respiratory: Negative.  Negative for cough and shortness of breath.   Cardiovascular: Negative.  Negative for chest pain and palpitations.  Gastrointestinal: Negative.  Negative for abdominal pain, diarrhea, nausea and vomiting.  Genitourinary: Negative.  Negative for dysuria and hematuria.  Skin: Negative.  Negative for rash.  Neurological: Negative.  Negative for dizziness, speech change, focal weakness, seizures, loss of consciousness and headaches.  All other systems reviewed and are negative.  Today's Vitals   10/13/20 1345  BP: 122/62  Pulse: 71  Temp: 97.7 F (36.5 C)  TempSrc: Oral  SpO2: 96%  Weight: 193 lb 12.8 oz (87.9 kg)  Height: _0  (1.727 m)   Body mass index is 29.47 kg/m.   Physical Exam Vitals reviewed.  Constitutional:      Appearance: Normal appearance.  HENT:     Head: Normocephalic and atraumatic.  Eyes:     Extraocular Movements: Extraocular movements intact.     Conjunctiva/sclera: Conjunctivae normal.     Pupils: Pupils are equal, round, and reactive to light.  Cardiovascular:     Rate and Rhythm: Normal rate and regular rhythm.     Pulses: Normal pulses.     Heart sounds: Normal heart sounds.  Pulmonary:     Effort: Pulmonary effort is normal.     Breath sounds: Normal breath sounds.  Abdominal:     Palpations: Abdomen is soft.     Tenderness: There is no abdominal tenderness.  Musculoskeletal:      Cervical back: Normal range of motion and neck supple. No tenderness.     Comments: Left upper extremity: Full range of motion at shoulder elbow and wrist levels.  No ecchymosis noted.  No tenderness to palpation.  Patient points to left upper humerus area. Left knee: Mild erythema with swelling and mild tenderness.  Still has full range of motion. Rest of exam unremarkable.  Skin:    General: Skin is warm and dry.  Neurological:     General: No focal deficit present.     Mental Status: He is alert and oriented to person, place, and time.  Psychiatric:        Mood and Affect: Mood normal.        Behavior: Behavior normal.    DG Chest 1 View  Result Date: 10/13/2020 CLINICAL DATA:  Previous CABG EXAM: CHEST  1 VIEW COMPARISON:  12/28/2014 FINDINGS: The films are mismarked right left. Previous median sternotomy and CABG. Chronic aortic atherosclerosis. Pulmonary vascularity is normal. Mild linear scarring at both lung bases. No evidence of heart failure or effusion. No acute bone finding. IMPRESSION: The films are mismarked right left. Previous CABG. Linear scarring at both lung bases. No active disease. Electronically Signed   By: Nelson Chimes M.D.   On: 10/13/2020 14:57   DG Knee 1-2 Views Left  Result Date: 10/13/2020 CLINICAL DATA:  Contusion EXAM: LEFT KNEE - 1-2 VIEW COMPARISON:  None. FINDINGS: No evidence of fracture or joint effusion. Ordinary osteoarthritis at the patellofemoral joint. Age related chondrocalcinosis of the menisci. IMPRESSION: No acute or traumatic finding. Ordinary patellofemoral osteoarthritis. Electronically Signed   By: Nelson Chimes M.D.   On: 10/13/2020 14:55   DG Humerus Left  Result Date: 10/13/2020 CLINICAL DATA:  Fall with contusion EXAM: LEFT HUMERUS - 2+ VIEW COMPARISON:  None. FINDINGS: There is no evidence of fracture or other focal bone lesions. Soft tissues are unremarkable. IMPRESSION: Normal Electronically Signed   By: Nelson Chimes M.D.   On:  10/13/2020 14:57     ASSESSMENT & PLAN: No symptoms of a stroke.  No loss of consciousness.  No significant injuries.  No other significant neurological findings.  Mental status at baseline's. X-rays negative for fracture.  No recent changes in medications.  Stable vital signs. However continues to be at high risk for falls. Fall precautions given.  Refer to PT for gait evaluation. Prescription for walker provided. Must follow-up with his PCP in the next 1 to 2 weeks. Yu was seen today for fall.  Diagnoses and all orders for this visit:  Gait abnormality -     PT gait training; Future -     For home use only DME 4 wheeled rolling walker with seat ([id number])  Frequent falls  Contusion of left knee, initial encounter -     DG Knee 1-2 Views Left  Arm contusion, left, initial encounter -     DG Humerus Left  Chest congestion -     Cancel: DG Chest 2 View -     DG Chest 1 View    Patient Instructions   Fall Prevention in the Home, Adult Falls can cause injuries and can happen to people of all ages. There are many things you can do to make your home safe and to help prevent falls. Ask for help when making these changes. What actions can I take to prevent falls? General Instructions  Use good lighting in all rooms. Replace any light bulbs that burn out.  Turn on the lights in dark areas. Use night-lights.  Keep items that you use often in easy-to-reach places. Lower the shelves around your home if needed.  Set up your furniture so you have a clear path. Avoid moving your furniture around.  Do not have throw rugs or other things on the floor that can make you trip.  Avoid walking on wet floors.  If any of your floors are uneven, fix them.  Add color or contrast paint or tape to clearly mark and help you see: ? Grab bars or handrails. ? First and last steps of staircases. ? Where the edge of each step is.  If you use a stepladder: ? Make sure that it is fully  opened. Do not climb a closed stepladder. ? Make sure the sides of the stepladder are locked in place. ? Ask someone to hold the stepladder while you use it.  Know where your pets are when moving through your home. What can I do in the bathroom?  Keep the floor dry. Clean up any water on the floor right away.  Remove soap buildup in the tub or shower.  Use nonskid mats or decals on the floor of the tub or shower.  Attach bath mats securely with double-sided, nonslip rug tape.  If you need to sit down in the shower, use a plastic, nonslip stool.  Install grab bars by the toilet and in the tub and shower. Do not use towel bars as grab bars.      What can I do in the bedroom?  Make sure that you have a light by your bed that is easy to reach.  Do not use any sheets or blankets for your bed that hang to the floor.  Have a firm chair with side arms that you can use for support when you get dressed. What can I do in the kitchen?  Clean up any spills right away.  If you need to reach something above you, use a step  stool with a grab bar.  Keep electrical cords out of the way.  Do not use floor polish or wax that makes floors slippery. What can I do with my stairs?  Do not leave any items on the stairs.  Make sure that you have a light switch at the top and the bottom of the stairs.  Make sure that there are handrails on both sides of the stairs. Fix handrails that are broken or loose.  Install nonslip stair treads on all your stairs.  Avoid having throw rugs at the top or bottom of the stairs.  Choose a carpet that does not hide the edge of the steps on the stairs.  Check carpeting to make sure that it is firmly attached to the stairs. Fix carpet that is loose or worn. What can I do on the outside of my home?  Use bright outdoor lighting.  Fix the edges of walkways and driveways and fix any cracks.  Remove anything that might make you trip as you walk through a door,  such as a raised step or threshold.  Trim any bushes or trees on paths to your home.  Check to see if handrails are loose or broken and that both sides of all steps have handrails.  Install guardrails along the edges of any raised decks and porches.  Clear paths of anything that can make you trip, such as tools or rocks.  Have leaves, snow, or ice cleared regularly.  Use sand or salt on paths during winter.  Clean up any spills in your garage right away. This includes grease or oil spills. What other actions can I take?  Wear shoes that: ? Have a low heel. Do not wear high heels. ? Have rubber bottoms. ? Feel good on your feet and fit well. ? Are closed at the toe. Do not wear open-toe sandals.  Use tools that help you move around if needed. These include: ? Canes. ? Walkers. ? Scooters. ? Crutches.  Review your medicines with your doctor. Some medicines can make you feel dizzy. This can increase your chance of falling. Ask your doctor what else you can do to help prevent falls. Where to find more information  Centers for Disease Control and Prevention, STEADI: http://www.wolf.info/  National Institute on Aging: http://kim-miller.com/ Contact a doctor if:  You are afraid of falling at home.  You feel weak, drowsy, or dizzy at home.  You fall at home. Summary  There are many simple things that you can do to make your home safe and to help prevent falls.  Ways to make your home safe include removing things that can make you trip and installing grab bars in the bathroom.  Ask for help when making these changes in your home. This information is not intended to replace advice given to you by your health care provider. Make sure you discuss any questions you have with your health care provider. Document Revised: 01/06/2020 Document Reviewed: 01/06/2020 Elsevier Patient Education  2021 [place], MD [facility]

## 2020-10-13 NOTE — Patient Instructions (Signed)
Fall Prevention in the Home, Adult Falls can cause injuries and can happen to people of all ages. There are many things you can do to make your home safe and to help prevent falls. Ask for help when making these changes. What actions can I take to prevent falls? General Instructions  Use good lighting in all rooms. Replace any light bulbs that burn out.  Turn on the lights in dark areas. Use night-lights.  Keep items that you use often in easy-to-reach places. Lower the shelves around your home if needed.  Set up your furniture so you have a clear path. Avoid moving your furniture around.  Do not have throw rugs or other things on the floor that can make you trip.  Avoid walking on wet floors.  If any of your floors are uneven, fix them.  Add color or contrast paint or tape to clearly mark and help you see: ? Grab bars or handrails. ? First and last steps of staircases. ? Where the edge of each step is.  If you use a stepladder: ? Make sure that it is fully opened. Do not climb a closed stepladder. ? Make sure the sides of the stepladder are locked in place. ? Ask someone to hold the stepladder while you use it.  Know where your pets are when moving through your home. What can I do in the bathroom?  Keep the floor dry. Clean up any water on the floor right away.  Remove soap buildup in the tub or shower.  Use nonskid mats or decals on the floor of the tub or shower.  Attach bath mats securely with double-sided, nonslip rug tape.  If you need to sit down in the shower, use a plastic, nonslip stool.  Install grab bars by the toilet and in the tub and shower. Do not use towel bars as grab bars.      What can I do in the bedroom?  Make sure that you have a light by your bed that is easy to reach.  Do not use any sheets or blankets for your bed that hang to the floor.  Have a firm chair with side arms that you can use for support when you get dressed. What can I do in  the kitchen?  Clean up any spills right away.  If you need to reach something above you, use a step stool with a grab bar.  Keep electrical cords out of the way.  Do not use floor polish or wax that makes floors slippery. What can I do with my stairs?  Do not leave any items on the stairs.  Make sure that you have a light switch at the top and the bottom of the stairs.  Make sure that there are handrails on both sides of the stairs. Fix handrails that are broken or loose.  Install nonslip stair treads on all your stairs.  Avoid having throw rugs at the top or bottom of the stairs.  Choose a carpet that does not hide the edge of the steps on the stairs.  Check carpeting to make sure that it is firmly attached to the stairs. Fix carpet that is loose or worn. What can I do on the outside of my home?  Use bright outdoor lighting.  Fix the edges of walkways and driveways and fix any cracks.  Remove anything that might make you trip as you walk through a door, such as a raised step or threshold.  Trim any   bushes or trees on paths to your home.  Check to see if handrails are loose or broken and that both sides of all steps have handrails.  Install guardrails along the edges of any raised decks and porches.  Clear paths of anything that can make you trip, such as tools or rocks.  Have leaves, snow, or ice cleared regularly.  Use sand or salt on paths during winter.  Clean up any spills in your garage right away. This includes grease or oil spills. What other actions can I take?  Wear shoes that: ? Have a low heel. Do not wear high heels. ? Have rubber bottoms. ? Feel good on your feet and fit well. ? Are closed at the toe. Do not wear open-toe sandals.  Use tools that help you move around if needed. These include: ? Canes. ? Walkers. ? Scooters. ? Crutches.  Review your medicines with your doctor. Some medicines can make you feel dizzy. This can increase your chance  of falling. Ask your doctor what else you can do to help prevent falls. Where to find more information  Centers for Disease Control and Prevention, [employer]: www.cdc.gov  National Institute on Aging: www.nia.nih.gov Contact a doctor if:  You are afraid of falling at home.  You feel weak, drowsy, or dizzy at home.  You fall at home. Summary  There are many simple things that you can do to make your home safe and to help prevent falls.  Ways to make your home safe include removing things that can make you trip and installing grab bars in the bathroom.  Ask for help when making these changes in your home. This information is not intended to replace advice given to you by your health care provider. Make sure you discuss any questions you have with your health care provider. Document Revised: 01/06/2020 Document Reviewed: 01/06/2020 Elsevier Patient Education  2021 Elsevier Inc.  

## 2020-10-14 ENCOUNTER — Telehealth: Payer: Self-pay | Admitting: Internal Medicine

## 2020-10-14 ENCOUNTER — Telehealth: Payer: Self-pay | Admitting: *Deleted

## 2020-10-14 DIAGNOSIS — R2689 Other abnormalities of gait and mobility: Secondary | ICD-10-CM

## 2020-10-14 DIAGNOSIS — R5381 Other malaise: Secondary | ICD-10-CM | POA: Insufficient documentation

## 2020-10-14 NOTE — Telephone Encounter (Signed)
Patients daughter called and said that [facility] has not received any orders for PT. She said that it can be faxed to [facility] at [phone]. Please advise

## 2020-10-14 NOTE — Telephone Encounter (Signed)
Pt has already been seen with last referral. Pretty sure new referral will need to be entered.

## 2020-10-14 NOTE — Telephone Encounter (Signed)
ordered

## 2020-10-14 NOTE — Telephone Encounter (Signed)
Faxed DME Rx for 4 wheeled rolling walker with seat to [facility], per daughter's request because he falls. Confirmation received OK.

## 2020-10-17 ENCOUNTER — Telehealth: Payer: Self-pay | Admitting: Internal Medicine

## 2020-10-17 NOTE — Telephone Encounter (Signed)
Faxed today

## 2020-10-17 NOTE — Telephone Encounter (Signed)
[place] physical therapist has called saying their fax number has changed so they have not received anything that was sent over.  Please resend to [phone]

## 2020-10-18 DIAGNOSIS — R296 Repeated falls: Secondary | ICD-10-CM | POA: Insufficient documentation

## 2020-10-18 NOTE — Progress Notes (Addendum)
Subjective:    Patient ID: Juan [name], male    DOB: [birth year], 85 y.o.   MRN: [id number]  HPI The patient is here for follow up.  He is here with his daughter.   He saw Dr Mitchel Honour on 4/28 for falling 4 times in the past 2 weeks.    No stroke symptoms. No LOC, no significant injuries. Xray neg for fracture. Referred to PT. Prescription for walker given.  He has started PT. he has not started to use the walker and refuses to do so.  He does have a cane with him, but he carries it with him and does not use it.  He does not feel that he needs it.  In talking with him and his daughter he feels the first 2 falls were related to gout and having foot pain and swelling.  The next 2 falls he thinks that his lower left leg gave out on him.  He denies any knee pain and states is not the knee and still lower leg.  He feels his balance is good.  His daughter is concerned about him falling again, staying in independent living and being able to drive.  He does have a life alert but refuses to use it.  When he falls in the past he has crawled to his pull cord for security where he lives.   He has a chronic shuffling step that is unchanged.  He has deferred any further evaluation with neurology in the past.  X-rays that he had recently does show some arthritis of his left knee.  He denies pain and does not feel that he needs to see anyone for that.    Medications and allergies reviewed with patient and updated if appropriate.  Patient Active Problem List   Diagnosis Date Noted  . Recurrent falls 10/18/2020  . Physical deconditioning 10/14/2020  . Diabetes mellitus without complication ([place]) 88/50/2774  . 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 Shuffling gait 12/29/2015  . Benign prostatic hyperplasia with urinary obstruction 08/28/2015  . Erectile dysfunction due to arterial insufficiency 08/28/2015  . Inguinal hernia, right s/p repair with mesh 09/15/15 08/28/2015  . Dysplastic  nevus 09/05/2014  . History of pancreatitis 06/25/2013  . CKD (chronic kidney disease) 06/24/2013  . TIA (transient ischemic attack) 02/07/2012  . Essential hypertension, benign 03/02/2009  . DIVERTICULAR DISEASE 03/01/2009  . PERSONAL HX COLONIC POLYPS 03/19/2008  . History of gout 12/09/2007  . Coronary atherosclerosis 12/09/2007  . Mixed hyperlipidemia 06/04/2007    Current Outpatient Medications on File Prior to Visit  Medication Sig Dispense Refill  . allopurinol (ZYLOPRIM) 100 MG tablet TAKE 1 TABLET(100 MG) BY MOUTH DAILY 90 tablet 1  . amLODipine (NORVASC) 2.5 MG tablet TAKE 1 TABLET(2.5 MG) BY MOUTH DAILY 90 tablet 1  . aspirin EC 81 MG tablet Take 81 mg by mouth daily.    . blood glucose meter kit and supplies KIT Dispense based on patient and insurance preference. Use up to four times daily as directed. (FOR ICD-9 250.00, 250.01). 1 each 0  . Choline Fenofibrate (FENOFIBRIC ACID) 135 MG CPDR TAKE 1 CAPSULE BY MOUTH DAILY 90 capsule 1  . finasteride (PROSCAR) 5 MG tablet Take 5 mg by mouth daily.    . metFORMIN (GLUCOPHAGE) 500 MG tablet TAKE 1 TABLET(500 MG) BY MOUTH DAILY WITH BREAKFAST 90 tablet 1  . metoprolol tartrate (LOPRESSOR) 50 MG tablet TAKE 1/2 TABLET(25 MG) BY MOUTH TWICE DAILY 90 tablet 1  .  pravastatin (PRAVACHOL) 20 MG tablet Take 1 tablet (20 mg total) by mouth 3 (three) times a week. 36 tablet 3   No current facility-administered medications on file prior to visit.    Past Medical History:  Diagnosis Date  . Cancer ([place])    skin  . CKD (chronic kidney disease) 06/24/2013  . Coronary artery disease    a. s/p CABG 2000; b. myoview 9/08: inf-septal scar with mild peri-infarct ischemia (low risk);  c.  Echo 8/13: mild focal basal septal hypertrophy, Gr 1 DD, mild LAE;  d. Lexiscan Myoview (06/20/2013): No reversible ischemia, inferior and septal infarct, inferior and septal hypokinesis, EF 60%;  e.  Echo (06/20/2011): EF 50-55%, normal wall motion, grade 1 diastolic  dysfunction, mild LAE  . Diverticular disease   . Duodenitis   . Elevated PSA   . GERD (gastroesophageal reflux disease)   . Gout   . Humerus fracture    right  . Hydronephrosis with ureteropelvic junction obstruction   . Hypercalcemia 02/02/12   a. 01/2012: 10.7.  Marland Kitchen Hyperlipidemia   . Hypertension   . Pancreatitis    a. ? Simvastatin related  . Pneumonia age 65 or 33   "I think only once" (06/19/2013)  . Syncope    a. 06/2013  . TIA (transient ischemic attack)    a. 01/2012 - presentation as acute imbalance;  b. 01/2012 carotid U/S w/o signif stenosis;  c. 01/2012 Echo: EF 60-65%, Gr 1 DD.;  d.  Carotid US (06/20/2013): 1-39% bilateral ICA stenosis  . Tubular adenoma polyp of rectum   . Type II diabetes mellitus ([place])    "diet and exercise controlled at present" (06/19/2013)    Past Surgical History:  Procedure Laterality Date  . APPENDECTOMY    . CARDIAC CATHETERIZATION  2000  . CHOLECYSTECTOMY    . COCHLEAR IMPLANT Right 1990's  . COLONOSCOPY W/ POLYPECTOMY     no polyp 2012  . CORONARY ARTERY BYPASS GRAFT  2000   CABG X5  . INGUINAL HERNIA REPAIR Right 09/15/2015   Procedure: OPEN REPAIR RIGHT INGUINAL HERNIA ;  Surgeon: Jackolyn Confer, MD;  Location: [facility];  Service: General;  Laterality: Right;  . INSERTION OF MESH Right 09/15/2015   Procedure: INSERTION OF MESH;  Surgeon: Jackolyn Confer, MD;  Location: [facility];  Service: General;  Laterality: Right;  . PARATHYROIDECTOMY  1978  . POLYPECTOMY    . REVERSE SHOULDER ARTHROPLASTY Right 12/30/2014   Procedure: Right shoulder ORIF, Biomet S3 Plate and ZImmer cable system ;  Surgeon: Netta Cedars, MD;  Location: [place];  Service: Orthopedics;  Laterality: Right;  . right ankle surgery  age 37  . TONSILLECTOMY  1943  . trachestomy as child for throat closing      Social History   Socioeconomic History  . Marital status: Widowed    Spouse name: Not on file  . Number of children: Not on file  . Years of education: Not on file  .  Highest education level: Not on file  Occupational History  . Occupation: retired    Fish farm manager: RETIRED  Tobacco Use  . Smoking status: Former Smoker    Years: 0.00    Types: Cigars  . Smokeless tobacco: Never Used  . Tobacco comment: 06/19/2013 "rare cigar"  Substance and Sexual Activity  . Alcohol use: Yes    Comment: 12/29/14 "might have a drink once/month"  . Drug use: No  . Sexual activity: Not Currently    Birth control/protection: Injection  Other Topics Concern  . Not on file  Social History Narrative   Does get regular exercise   Social Determinants of Health   Financial Resource Strain: Not on file  Food Insecurity: Not on file  Transportation Needs: Not on file  Physical Activity: Not on file  Stress: Not on file  Social Connections: Not on file    Family History  Problem Relation Age of Onset  . Melanoma Father   . Heart attack Father         in 2s  . Diabetes Neg Hx     Review of Systems  Constitutional: Negative for chills and fever.  Respiratory: Negative for cough, shortness of breath and wheezing.   Cardiovascular: Negative for chest pain and palpitations.  Gastrointestinal: Negative for abdominal pain.  Musculoskeletal: Positive for back pain (sometimes). Negative for arthralgias.       Left lower leg pain - dull pain - constant  Neurological: Negative for dizziness, weakness, light-headedness and numbness.       Objective:   Vitals:   10/19/20 1027  BP: 124/68  Pulse: 64  Temp: 98.2 F (36.8 C)  SpO2: 97%   BP Readings from Last 3 Encounters:  10/19/20 124/68  10/13/20 122/62  08/23/20 124/78   Wt Readings from Last 3 Encounters:  10/19/20 174 lb 12.8 oz (79.3 kg)  10/13/20 193 lb 12.8 oz (87.9 kg)  08/23/20 176 lb (79.8 kg)   Body mass index is 26.58 kg/m.   Physical Exam    Constitutional: Appears well-developed and well-nourished. No distress.  HENT:  Head: Normocephalic and atraumatic.  Neck: Neck supple. No tracheal  deviation present. No thyromegaly present.  No cervical lymphadenopathy Cardiovascular: Normal rate, regular rhythm and normal heart sounds.   No murmur heard. No carotid bruit .  No edema Pulmonary/Chest: Effort normal and breath sounds normal. No respiratory distress. No has no wheezes. No rales. Msk: scoliosis Neurological: Shuffling, small step gait-this is not new. Skin: Skin is warm and dry. Not diaphoretic.  Psychiatric: Normal mood and affect. Behavior is normal.      Assessment & Plan:    See Problem List for Assessment and Plan of chronic medical problems.    This visit occurred during the SARS-CoV-2 public health emergency.  Safety protocols were in place, including screening questions prior to the visit, additional usage of staff PPE, and extensive cleaning of exam room while observing appropriate contact time as indicated for disinfecting solutions.

## 2020-10-19 ENCOUNTER — Other Ambulatory Visit: Payer: Self-pay

## 2020-10-19 ENCOUNTER — Encounter: Payer: Self-pay | Admitting: Internal Medicine

## 2020-10-19 ENCOUNTER — Ambulatory Visit: Payer: Federal, State, Local not specified - PPO | Admitting: Internal Medicine

## 2020-10-19 VITALS — BP 124/68 | HR 64 | Temp 98.2°F | Ht 68.0 in | Wt 174.8 lb

## 2020-10-19 DIAGNOSIS — I10 Essential (primary) hypertension: Secondary | ICD-10-CM

## 2020-10-19 DIAGNOSIS — R296 Repeated falls: Secondary | ICD-10-CM | POA: Diagnosis not present

## 2020-10-19 DIAGNOSIS — E119 Type 2 diabetes mellitus without complications: Secondary | ICD-10-CM

## 2020-10-19 DIAGNOSIS — R2689 Other abnormalities of gait and mobility: Secondary | ICD-10-CM

## 2020-10-19 DIAGNOSIS — R5381 Other malaise: Secondary | ICD-10-CM

## 2020-10-19 NOTE — Assessment & Plan Note (Signed)
Chronic Well-controlled Denies any hypotension Continue amlodipine 2.5 mg daily, metoprolol 12.5 mg twice daily

## 2020-10-19 NOTE — Assessment & Plan Note (Signed)
Chronic Unchanged Has deferred further evaluation in the past, but agrees to see neurology now for further evaluation of his recurrent falls Discussed that his gait/balance/weakness could be contributing to the falls

## 2020-10-19 NOTE — Assessment & Plan Note (Signed)
Chronic Lab Results  Component Value Date   HGBA1C 6.7 (H) 08/23/2020   Well-controlled Continue metformin 500 mg daily No symptoms to suggest hypoglycemia

## 2020-10-19 NOTE — Assessment & Plan Note (Signed)
Chronic His daughter states he has been less active and they have noticed some weakness Continue PT

## 2020-10-19 NOTE — Assessment & Plan Note (Addendum)
He has had 4 falls in the past few weeks but do not seem to be cardiac, sugar, infection related He feels the first 2 falls were related to gout and the second 2 falls were likely related to his left lower leg giving out on him.  He denies knee pain, back pain.  Is difficult to say if that pain is referred pain from the knee or back He has had some difficulty getting up and sitting down at home so his daughters have gotten him a raised toilet seat and elevated the couch, which has helped Discussed that his falls may be multifactorial from arthritis, poor balance, leg weakness, etc. Stressed that he needs to be using a walker or cane to ambulate to prevent falls Continue PT-work on lower extremity strength, balance He agrees to see neurology for further evaluation-referred

## 2020-10-19 NOTE — Patient Instructions (Addendum)
    Medications changes include : none     A referral was ordered for neurology.        Someone from their office will call you to schedule an appointment.

## 2020-10-20 ENCOUNTER — Emergency Department (HOSPITAL_COMMUNITY): Payer: Federal, State, Local not specified - PPO

## 2020-10-20 ENCOUNTER — Observation Stay (HOSPITAL_COMMUNITY)
Admission: EM | Admit: 2020-10-20 | Discharge: 2020-10-25 | Disposition: A | Discharge status: Home / Self Care | Payer: Federal, State, Local not specified - PPO | Attending: Internal Medicine | Admitting: Internal Medicine

## 2020-10-20 ENCOUNTER — Other Ambulatory Visit: Payer: Self-pay

## 2020-10-20 ENCOUNTER — Encounter (HOSPITAL_COMMUNITY): Payer: Self-pay

## 2020-10-20 DIAGNOSIS — G459 Transient cerebral ischemic attack, unspecified: Principal | ICD-10-CM | POA: Diagnosis present

## 2020-10-20 DIAGNOSIS — Z87891 Personal history of nicotine dependence: Secondary | ICD-10-CM | POA: Insufficient documentation

## 2020-10-20 DIAGNOSIS — Z79899 Other long term (current) drug therapy: Secondary | ICD-10-CM | POA: Diagnosis not present

## 2020-10-20 DIAGNOSIS — E1129 Type 2 diabetes mellitus with other diabetic kidney complication: Secondary | ICD-10-CM

## 2020-10-20 DIAGNOSIS — Z7984 Long term (current) use of oral hypoglycemic drugs: Secondary | ICD-10-CM | POA: Diagnosis not present

## 2020-10-20 DIAGNOSIS — E119 Type 2 diabetes mellitus without complications: Secondary | ICD-10-CM

## 2020-10-20 DIAGNOSIS — Z20822 Contact with and (suspected) exposure to covid-19: Secondary | ICD-10-CM | POA: Diagnosis not present

## 2020-10-20 DIAGNOSIS — N1831 Chronic kidney disease, stage 3a: Secondary | ICD-10-CM | POA: Diagnosis not present

## 2020-10-20 DIAGNOSIS — Z85828 Personal history of other malignant neoplasm of skin: Secondary | ICD-10-CM | POA: Insufficient documentation

## 2020-10-20 DIAGNOSIS — Z96611 Presence of right artificial shoulder joint: Secondary | ICD-10-CM | POA: Insufficient documentation

## 2020-10-20 DIAGNOSIS — I2510 Atherosclerotic heart disease of native coronary artery without angina pectoris: Secondary | ICD-10-CM | POA: Insufficient documentation

## 2020-10-20 DIAGNOSIS — Z7982 Long term (current) use of aspirin: Secondary | ICD-10-CM | POA: Diagnosis not present

## 2020-10-20 DIAGNOSIS — E1122 Type 2 diabetes mellitus with diabetic chronic kidney disease: Secondary | ICD-10-CM | POA: Insufficient documentation

## 2020-10-20 DIAGNOSIS — R410 Disorientation, unspecified: Secondary | ICD-10-CM | POA: Diagnosis present

## 2020-10-20 DIAGNOSIS — I10 Essential (primary) hypertension: Secondary | ICD-10-CM | POA: Diagnosis present

## 2020-10-20 DIAGNOSIS — R296 Repeated falls: Secondary | ICD-10-CM

## 2020-10-20 DIAGNOSIS — I129 Hypertensive chronic kidney disease with stage 1 through stage 4 chronic kidney disease, or unspecified chronic kidney disease: Secondary | ICD-10-CM | POA: Insufficient documentation

## 2020-10-20 DIAGNOSIS — Z8739 Personal history of other diseases of the musculoskeletal system and connective tissue: Secondary | ICD-10-CM

## 2020-10-20 DIAGNOSIS — R2689 Other abnormalities of gait and mobility: Secondary | ICD-10-CM | POA: Diagnosis present

## 2020-10-20 DIAGNOSIS — N189 Chronic kidney disease, unspecified: Secondary | ICD-10-CM | POA: Diagnosis present

## 2020-10-20 LAB — COMPREHENSIVE METABOLIC PANEL
ALT: 14 U/L (ref 0–44)
AST: 17 U/L (ref 15–41)
Albumin: 3.7 g/dL (ref 3.5–5.0)
Alkaline Phosphatase: 32 U/L — ABNORMAL LOW (ref 38–126)
Anion gap: 9 (ref 5–15)
BUN: 34 mg/dL — ABNORMAL HIGH (ref 8–23)
CO2: 21 mmol/L — ABNORMAL LOW (ref 22–32)
Calcium: 10.2 mg/dL (ref 8.9–10.3)
Chloride: 109 mmol/L (ref 98–111)
Creatinine, Ser: 1.4 mg/dL — ABNORMAL HIGH (ref 0.61–1.24)
GFR, Estimated: 50 mL/min — ABNORMAL LOW (ref >60)
Glucose, Bld: 137 mg/dL — ABNORMAL HIGH (ref 70–99)
Potassium: 4.2 mmol/L (ref 3.5–5.1)
Sodium: 139 mmol/L (ref 135–145)
Total Bilirubin: 0.4 mg/dL (ref 0.3–1.2)
Total Protein: 6.3 g/dL — ABNORMAL LOW (ref 6.5–8.1)

## 2020-10-20 LAB — URINALYSIS, ROUTINE W REFLEX MICROSCOPIC
Bilirubin Urine: NEGATIVE
Glucose, UA: NEGATIVE mg/dL
Hgb urine dipstick: NEGATIVE
Ketones, ur: NEGATIVE mg/dL
Leukocytes,Ua: NEGATIVE
Nitrite: NEGATIVE
Protein, ur: NEGATIVE mg/dL
Specific Gravity, Urine: 1.012 (ref 1.005–1.030)
pH: 5 (ref 5.0–8.0)

## 2020-10-20 LAB — CBC WITH DIFFERENTIAL/PLATELET
Abs Immature Granulocytes: 0.02 10*3/uL (ref 0.00–0.07)
Basophils Absolute: 0.1 10*3/uL (ref 0.0–0.1)
Basophils Relative: 1 %
Eosinophils Absolute: 0.3 10*3/uL (ref 0.0–0.5)
Eosinophils Relative: 5 %
HCT: 39.1 % (ref 39.0–52.0)
Hemoglobin: 12.5 g/dL — ABNORMAL LOW (ref 13.0–17.0)
Immature Granulocytes: 0 %
Lymphocytes Relative: 21 %
Lymphs Abs: 1.3 10*3/uL (ref 0.7–4.0)
MCH: 29.6 pg (ref 26.0–34.0)
MCHC: 32 g/dL (ref 30.0–36.0)
MCV: 92.7 fL (ref 80.0–100.0)
Monocytes Absolute: 0.7 10*3/uL (ref 0.1–1.0)
Monocytes Relative: 11 %
Neutro Abs: 3.9 10*3/uL (ref 1.7–7.7)
Neutrophils Relative %: 62 %
Platelets: 265 10*3/uL (ref 150–400)
RBC: 4.22 MIL/uL (ref 4.22–5.81)
RDW: 13.2 % (ref 11.5–15.5)
WBC: 6.3 10*3/uL (ref 4.0–10.5)
nRBC: 0 % (ref 0.0–0.2)

## 2020-10-20 LAB — SARS CORONAVIRUS 2 (TAT 6-24 HRS): SARS Coronavirus 2: NEGATIVE

## 2020-10-20 LAB — GLUCOSE, CAPILLARY: Glucose-Capillary: 123 mg/dL — ABNORMAL HIGH (ref 70–99)

## 2020-10-20 MED ORDER — ACETAMINOPHEN 160 MG/5ML PO SOLN: 650.0000 mg | ORAL | Status: DC | PRN

## 2020-10-20 MED ORDER — STROKE: EARLY STAGES OF RECOVERY BOOK
Freq: Once | Status: AC
  Filled 2020-10-20: qty 1

## 2020-10-20 MED ORDER — SODIUM CHLORIDE 0.9 % IV SOLN: INTRAVENOUS | Status: DC

## 2020-10-20 MED ORDER — ALLOPURINOL 100 MG PO TABS
100.0000 mg | ORAL_TABLET | Freq: Every day | ORAL | Status: DC
  Administered 2020-10-20 – 2020-10-25 (×6): 100 mg via ORAL
  Filled 2020-10-20 (×6): qty 1

## 2020-10-20 MED ORDER — ASPIRIN EC 81 MG PO TBEC
81.0000 mg | DELAYED_RELEASE_TABLET | Freq: Every day | ORAL | Status: DC
  Administered 2020-10-21 – 2020-10-25 (×5): 81 mg via ORAL
  Filled 2020-10-20 (×5): qty 1

## 2020-10-20 MED ORDER — HEPARIN SODIUM (PORCINE) 5000 UNIT/ML IJ SOLN
5000.0000 [IU] | Freq: Three times a day (TID) | INTRAMUSCULAR | Status: DC
  Administered 2020-10-20 – 2020-10-25 (×15): 5000 [IU] via SUBCUTANEOUS
  Filled 2020-10-20 (×14): qty 1

## 2020-10-20 MED ORDER — ACETAMINOPHEN 325 MG PO TABS
650.0000 mg | ORAL_TABLET | ORAL | Status: DC | PRN
  Administered 2020-10-21 – 2020-10-25 (×13): 650 mg via ORAL
  Filled 2020-10-20 (×13): qty 2

## 2020-10-20 MED ORDER — PRAVASTATIN SODIUM 10 MG PO TABS
20.0000 mg | ORAL_TABLET | ORAL | Status: DC
  Administered 2020-10-21 – 2020-10-24 (×2): 20 mg via ORAL
  Filled 2020-10-20 (×2): qty 2

## 2020-10-20 MED ORDER — FINASTERIDE 5 MG PO TABS
5.0000 mg | ORAL_TABLET | Freq: Every day | ORAL | Status: DC
  Administered 2020-10-20 – 2020-10-25 (×6): 5 mg via ORAL
  Filled 2020-10-20 (×6): qty 1

## 2020-10-20 MED ORDER — ACETAMINOPHEN 650 MG RE SUPP: 650.0000 mg | RECTAL | Status: DC | PRN

## 2020-10-20 MED ORDER — INSULIN ASPART 100 UNIT/ML IJ SOLN
0.0000 [IU] | Freq: Three times a day (TID) | INTRAMUSCULAR | Status: DC
  Administered 2020-10-21 – 2020-10-22 (×3): 1 [IU] via SUBCUTANEOUS
  Administered 2020-10-22 – 2020-10-23 (×2): 2 [IU] via SUBCUTANEOUS
  Administered 2020-10-24: 1 [IU] via SUBCUTANEOUS
  Administered 2020-10-24: 2 [IU] via SUBCUTANEOUS
  Administered 2020-10-25 (×2): 1 [IU] via SUBCUTANEOUS

## 2020-10-20 MED ORDER — SENNOSIDES-DOCUSATE SODIUM 8.6-50 MG PO TABS: 1.0000 | ORAL_TABLET | Freq: Every evening | ORAL | Status: DC | PRN

## 2020-10-20 NOTE — ED Provider Notes (Signed)
[facility] EMERGENCY DEPARTMENT Provider Note   CSN: [id number] Arrival date & time: 10/20/20  1312     History Chief Complaint  Patient presents with  . Altered Mental Status    Juan [name] is a 85 y.o. male.  HPI Patient presented from the grocery store.  Reportedly was confused and had a shuffling gait.  Reportedly attempted to hand his keys to the cashier instead of the credit card.  Patient states he felt dizzy after he went to the bathroom.  States he feels better now.  Patient is hard of hearing.  No headache.  Has been doing well otherwise.  Reviewing records he has been seen recently for shuffling gait.  No headache.  No fevers or chills.  No nausea or vomiting.  No dysuria.    Past Medical History:  Diagnosis Date  . Cancer ([place])    skin  . CKD (chronic kidney disease) 06/24/2013  . Coronary artery disease    a. s/p CABG 2000; b. myoview 9/08: inf-septal scar with mild peri-infarct ischemia (low risk);  c.  Echo 8/13: mild focal basal septal hypertrophy, Gr 1 DD, mild LAE;  d. Lexiscan Myoview (06/20/2013): No reversible ischemia, inferior and septal infarct, inferior and septal hypokinesis, EF 60%;  e.  Echo (06/20/2011): EF 50-55%, normal wall motion, grade 1 diastolic dysfunction, mild LAE  . Diverticular disease   . Duodenitis   . Elevated PSA   . GERD (gastroesophageal reflux disease)   . Gout   . Humerus fracture    right  . Hydronephrosis with ureteropelvic junction obstruction   . Hypercalcemia 02/02/12   a. 01/2012: 10.7.  Marland Kitchen Hyperlipidemia   . Hypertension   . Pancreatitis    a. ? Simvastatin related  . Pneumonia age 34 or 47   "I think only once" (06/19/2013)  . Syncope    a. 06/2013  . TIA (transient ischemic attack)    a. 01/2012 - presentation as acute imbalance;  b. 01/2012 carotid U/S w/o signif stenosis;  c. 01/2012 Echo: EF 60-65%, Gr 1 DD.;  d.  Carotid US (06/20/2013): 1-39% bilateral ICA stenosis  . Tubular adenoma polyp of rectum   .  Type II diabetes mellitus ([place])    "diet and exercise controlled at present" (06/19/2013)    Patient Active Problem List   Diagnosis Date Noted  . Recurrent falls 10/18/2020  . Physical deconditioning 10/14/2020  . Diabetes mellitus without complication ([place]) 27/25/3664  . 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 Shuffling gait 12/29/2015  . Benign prostatic hyperplasia with urinary obstruction 08/28/2015  . Erectile dysfunction due to arterial insufficiency 08/28/2015  . Inguinal hernia, right s/p repair with mesh 09/15/15 08/28/2015  . Dysplastic nevus 09/05/2014  . History of pancreatitis 06/25/2013  . CKD (chronic kidney disease) 06/24/2013  . TIA (transient ischemic attack) 02/07/2012  . Essential hypertension, benign 03/02/2009  . DIVERTICULAR DISEASE 03/01/2009  . PERSONAL HX COLONIC POLYPS 03/19/2008  . History of gout 12/09/2007  . Coronary atherosclerosis 12/09/2007  . Mixed hyperlipidemia 06/04/2007    Past Surgical History:  Procedure Laterality Date  . APPENDECTOMY    . CARDIAC CATHETERIZATION  2000  . CHOLECYSTECTOMY    . COCHLEAR IMPLANT Right 1990's  . COLONOSCOPY W/ POLYPECTOMY     no polyp 2012  . CORONARY ARTERY BYPASS GRAFT  2000   CABG X5  . INGUINAL HERNIA REPAIR Right 09/15/2015   Procedure: OPEN REPAIR RIGHT INGUINAL HERNIA ;  Surgeon: Sherren Mocha  Rosenbower, MD;  Location: [facility];  Service: General;  Laterality: Right;  . INSERTION OF MESH Right 09/15/2015   Procedure: INSERTION OF MESH;  Surgeon: Jackolyn Confer, MD;  Location: [facility];  Service: General;  Laterality: Right;  . PARATHYROIDECTOMY  1978  . POLYPECTOMY    . REVERSE SHOULDER ARTHROPLASTY Right 12/30/2014   Procedure: Right shoulder ORIF, Biomet S3 Plate and ZImmer cable system ;  Surgeon: Netta Cedars, MD;  Location: [facility];  Service: Orthopedics;  Laterality: Right;  . right ankle surgery  age 62  . TONSILLECTOMY  1943  . trachestomy as child for throat closing         Family  History  Problem Relation Age of Onset  . Melanoma Father   . Heart attack Father         in 24s  . Diabetes Neg Hx     Social History   Tobacco Use  . Smoking status: Former Smoker    Years: 0.00    Types: Cigars  . Smokeless tobacco: Never Used  . Tobacco comment: 06/19/2013 "rare cigar"  Substance Use Topics  . Alcohol use: Yes    Comment: 12/29/14 "might have a drink once/month"  . Drug use: No    Home Medications Prior to Admission medications   Medication Sig Start Date End Date Taking? Authorizing Provider  allopurinol (ZYLOPRIM) 100 MG tablet TAKE 1 TABLET(100 MG) BY MOUTH DAILY 07/01/20   Binnie Rail, MD  amLODipine (NORVASC) 2.5 MG tablet TAKE 1 TABLET(2.5 MG) BY MOUTH DAILY 10/04/20   Binnie Rail, MD  aspirin EC 81 MG tablet Take 81 mg by mouth daily.    [provider]  blood glucose meter kit and supplies KIT Dispense based on patient and insurance preference. Use up to four times daily as directed. (FOR ICD-9 250.00, 250.01). 01/04/17   Golden Circle, FNP  Choline Fenofibrate (FENOFIBRIC ACID) 135 MG CPDR TAKE 1 CAPSULE BY MOUTH DAILY 08/03/20   Binnie Rail, MD  finasteride (PROSCAR) 5 MG tablet Take 5 mg by mouth daily.    [provider]  metFORMIN (GLUCOPHAGE) 500 MG tablet TAKE 1 TABLET(500 MG) BY MOUTH DAILY WITH BREAKFAST 09/27/20   Burns, Claudina Lick, MD  metoprolol tartrate (LOPRESSOR) 50 MG tablet TAKE 1/2 TABLET(25 MG) BY MOUTH TWICE DAILY 08/26/20   Binnie Rail, MD  pravastatin (PRAVACHOL) 20 MG tablet Take 1 tablet (20 mg total) by mouth 3 (three) times a week. 09/07/19   [name], MD    Allergies    Simvastatin  Review of Systems   Review of Systems  Constitutional: Negative for appetite change, chills and fever.  HENT: Negative for congestion.   Respiratory: Negative for shortness of breath.   Cardiovascular: Negative for chest pain.  Gastrointestinal: Negative for abdominal pain.  Genitourinary: Negative for flank  pain.  Musculoskeletal: Negative for back pain.  Skin: Negative for rash.  Neurological: Positive for speech difficulty.  Psychiatric/Behavioral: Positive for confusion.    Physical Exam Updated Vital Signs BP (!) 162/80   Pulse (!) 58   Temp 98.7 F (37.1 C) (Oral)   Resp 15   SpO2 98%   Physical Exam Vitals and nursing note reviewed.  HENT:     Head: Atraumatic.  Eyes:     Pupils: Pupils are equal, round, and reactive to light.  Pulmonary:     Breath sounds: No wheezing or rhonchi.  Abdominal:     Tenderness: There is no  abdominal tenderness.  Musculoskeletal:        General: No tenderness.     Cervical back: Neck supple.  Skin:    General: Skin is warm.     Capillary Refill: Capillary refill takes less than 2 seconds.  Neurological:     Mental Status: He is alert.     Comments: Eye movement intact.  Awake and appropriate.  Moves all extremities.  Has hearing aids.   Patient has a right-sided cochlear implant.  ED Results / Procedures / Treatments   Labs (all labs ordered are listed, but only abnormal results are displayed) Labs Reviewed  COMPREHENSIVE METABOLIC PANEL - Abnormal; Notable for the following components:      Result Value   CO2 21 (*)    Glucose, Bld 137 (*)    BUN 34 (*)    Creatinine, Ser 1.40 (*)    Total Protein 6.3 (*)    Alkaline Phosphatase 32 (*)    GFR, Estimated 50 (*)    All other components within normal limits  CBC WITH DIFFERENTIAL/PLATELET - Abnormal; Notable for the following components:   Hemoglobin 12.5 (*)    All other components within normal limits  URINALYSIS, ROUTINE W REFLEX MICROSCOPIC    EKG EKG Interpretation  Date/Time:  Thursday Oct 20 2020 14:21:23 EDT Ventricular Rate:  62 PR Interval:  177 QRS Duration: 106 QT Interval:  401 QTC Calculation: 408 R Axis:   -21 Text Interpretation: Sinus rhythm Borderline left axis deviation Abnormal R-wave progression, early transition Consider anterior infarct  Nonspecific T abnormalities, lateral leads Confirmed by Davonna Belling [phone]) on 10/20/2020 2:33:30 PM   Radiology CT Head Wo Contrast  Result Date: 10/20/2020 CLINICAL DATA:  Neuro deficit.  Acute stroke suspected. EXAM: CT HEAD WITHOUT CONTRAST TECHNIQUE: Contiguous axial images were obtained from the base of the skull through the vertex without intravenous contrast. COMPARISON:  12/28/2014 FINDINGS: Brain: There is no evidence for acute hemorrhage, hydrocephalus, mass lesion, or abnormal extra-axial fluid collection. No definite CT evidence for acute infarction. Portions of the right temporal and parietal lobes are obscured by beam hardening artifact from right cochlear implant hardware. Diffuse loss of parenchymal volume is consistent with atrophy. Patchy low attenuation in the deep hemispheric and periventricular white matter is nonspecific, but likely reflects chronic microvascular ischemic demyelination. Vascular: No hyperdense vessel or unexpected calcification. Skull: No evidence for fracture. No worrisome lytic or sclerotic lesion. Sinuses/Orbits: Right maxillary sinus is opacified. Opacification of anterior right ethmoid air cells noted. Frontal sinuses are clear. Surgical changes noted right your region consistent with cochlear implant. Other: None. IMPRESSION: 1. Stable exam.  No acute intracranial abnormality. 2. Atrophy with chronic small vessel white matter disease. 3. Right cochlear implant. Electronically Signed   By: Misty Stanley M.D.   On: 10/20/2020 14:37    Procedures Procedures   Medications Ordered in ED Medications - No data to display  ED Course  I have reviewed the triage vital signs and the nursing notes.  Pertinent labs & imaging results that were available during my care of the patient were reviewed by me and considered in my medical decision making (see chart for details).    MDM Rules/Calculators/A&P                          Patient presents with reported  confusion and difficulty walking.  Improved now.  Now back at baseline.  Patient states he was just feeling a little bad  after going to the bathroom.  However patient's daughter later arrived and stated that she was told that he was having difficulty moving the left side.  Difficulty walking.  Not using his arm and was dragging his leg on the left side.  Improved now.  Patient does have a cochlear implant.  Has an appointment to see neurology for some shuffling gait and more frequent falls but reportedly that is in another month.  Discussed with Dr. Cheral Marker from neurology.  Patient's ABCD2 score is 5.  Would benefit from mission to the hospital.  Will discuss with hospitalist. Final Clinical Impression(s) / ED Diagnoses Final diagnoses:  TIA (transient ischemic attack)    Rx / DC Orders ED Discharge Orders    None       Davonna Belling, MD 10/20/20 1640

## 2020-10-20 NOTE — ED Triage Notes (Signed)
Pt arrived by EMS from grocery store. Staff at store called because pt was confused and walking different than his normal gait  Staff report to EMS that pt attempted use hand cashier keys instead of credit card and seemed to be having trouble walking  By the time EMS arrived pt was back to baseline

## 2020-10-20 NOTE — Consult Note (Signed)
NEURO HOSPITALIST CONSULT NOTE   Requestig physician: Dr. Maylene Roes  Reason for Consult: Transient LLE weakness, LUE weakness and slurred speech  History obtained from:  Patient, Daughter and Chart     HPI:                                                                                                                                          Juan [name] is an 85 y.o. male with a PMHx of CKD, skin cancer, CAD, prior CABG, elevated PSA, HLD, HTN, prior pancreatitis thought possibly to be due to simvastatin, prior TIA and DM2, who presents to the ED after an episode of acute LLE weakness while shopping today. At baseline, the patient uses a cane intermittently. Per daughter, he often carries it around with him but does not actually use it to ambulate. Today, while leaving the grocery store, he did need to rely on the cane after his LLE started to feel week. People at the store who are acquainted with Juan [name] saw the left leg dragging and the patient's right arm hanging relatively motionless at his side, in addition to slurred speech. Of note, the patient is severely hard of hearing and has a cochlear implant, but mostly lip-reads and has the characteristic scanning quality to his speech with dysarthria which is commonly present in individuals with severe deafness; in this context, the people who know the patient felt that his speech was significantly slurred relative to his baseline. The patient states that the weakness he experienced is currently resolved. Has chronic back pain but with no exacerbation of such during the spell at the grocery store. No symptoms of sciatica. No leg pain. No headache, SOB, CP or vision loss.   Home medications include daily ASA.   Past Medical History:  Diagnosis Date  . Cancer ([place])    skin  . CKD (chronic kidney disease) 06/24/2013  . Coronary artery disease    a. s/p CABG 2000; b. myoview 9/08: inf-septal scar with mild peri-infarct ischemia (low  risk);  c.  Echo 8/13: mild focal basal septal hypertrophy, Gr 1 DD, mild LAE;  d. Lexiscan Myoview (06/20/2013): No reversible ischemia, inferior and septal infarct, inferior and septal hypokinesis, EF 60%;  e.  Echo (06/20/2011): EF 50-55%, normal wall motion, grade 1 diastolic dysfunction, mild LAE  . Diverticular disease   . Duodenitis   . Elevated PSA   . GERD (gastroesophageal reflux disease)   . Gout   . Humerus fracture    right  . Hydronephrosis with ureteropelvic junction obstruction   . Hypercalcemia 02/02/12   a. 01/2012: 10.7.  Marland Kitchen Hyperlipidemia   . Hypertension   . Pancreatitis    a. ? Simvastatin related  . Pneumonia age 48 or 89   "I  think only once" (06/19/2013)  . Syncope    a. 06/2013  . TIA (transient ischemic attack)    a. 01/2012 - presentation as acute imbalance;  b. 01/2012 carotid U/S w/o signif stenosis;  c. 01/2012 Echo: EF 60-65%, Gr 1 DD.;  d.  Carotid US (06/20/2013): 1-39% bilateral ICA stenosis  . Tubular adenoma polyp of rectum   . Type II diabetes mellitus ([place])    "diet and exercise controlled at present" (06/19/2013)    Past Surgical History:  Procedure Laterality Date  . APPENDECTOMY    . CARDIAC CATHETERIZATION  2000  . CHOLECYSTECTOMY    . COCHLEAR IMPLANT Right 1990's  . COLONOSCOPY W/ POLYPECTOMY     no polyp 2012  . CORONARY ARTERY BYPASS GRAFT  2000   CABG X5  . INGUINAL HERNIA REPAIR Right 09/15/2015   Procedure: OPEN REPAIR RIGHT INGUINAL HERNIA ;  Surgeon: Jackolyn Confer, MD;  [facility]: [facility];  Service: General;  Laterality: Right;  . INSERTION OF MESH Right 09/15/2015   Procedure: INSERTION OF MESH;  Surgeon: Jackolyn Confer, MD;  [facility];  Service: General;  Laterality: Right;  . PARATHYROIDECTOMY  1978  . POLYPECTOMY    . REVERSE SHOULDER ARTHROPLASTY Right 12/30/2014   Procedure: Right shoulder ORIF, Biomet S3 Plate and ZImmer cable system ;  Surgeon: Netta Cedars, MD;  Location: [facility];  Service: Orthopedics;  Laterality: Right;  .  right ankle surgery  age 68  . TONSILLECTOMY  1943  . trachestomy as child for throat closing      Family History  Problem Relation Age of Onset  . Melanoma Father   . Heart attack Father         in 64s  . Diabetes Neg Hx               Social History:  reports that he has quit smoking. His smoking use included cigars. He quit after 0.00 years of use. He has never used smokeless tobacco. He reports current alcohol use. He reports that he does not use drugs.  Allergies  Allergen Reactions  . Simvastatin     REACTION:  Possible statin induced pancreatitis    HOME MEDICATIONS:                                                                                                                      No current facility-administered medications on file prior to encounter.   Current Outpatient Medications on File Prior to Encounter  Medication Sig Dispense Refill  . allopurinol (ZYLOPRIM) 100 MG tablet TAKE 1 TABLET(100 MG) BY MOUTH DAILY 90 tablet 1  . amLODipine (NORVASC) 2.5 MG tablet TAKE 1 TABLET(2.5 MG) BY MOUTH DAILY 90 tablet 1  . aspirin EC 81 MG tablet Take 81 mg by mouth daily.    . blood glucose meter kit and supplies KIT Dispense based on patient and insurance preference. Use up to four times daily as directed. (FOR ICD-9 250.00, 250.01). 1 each 0  .  Choline Fenofibrate (FENOFIBRIC ACID) 135 MG CPDR TAKE 1 CAPSULE BY MOUTH DAILY 90 capsule 1  . finasteride (PROSCAR) 5 MG tablet Take 5 mg by mouth daily.    . metFORMIN (GLUCOPHAGE) 500 MG tablet TAKE 1 TABLET(500 MG) BY MOUTH DAILY WITH BREAKFAST 90 tablet 1  . metoprolol tartrate (LOPRESSOR) 50 MG tablet TAKE 1/2 TABLET(25 MG) BY MOUTH TWICE DAILY 90 tablet 1  . pravastatin (PRAVACHOL) 20 MG tablet Take 1 tablet (20 mg total) by mouth 3 (three) times a week. 36 tablet 3     ROS:                                                                                                                                       As per HPI.  Comprehensive ROS otherwise negative.    Blood pressure (!) 153/99, pulse 80, temperature 98.1 F (36.7 C), temperature source Oral, resp. rate 18, SpO2 95 %.   General Examination:                                                                                                       Physical Exam  HEENT-  [place]/AT. External portions of cochlear hearing aid device are seen on the right.  Lungs- Respirations unlabored Extremities- Mild pitting edema to distal BLE   Neurological Examination Mental Status: Awake and alert. Primarily reads lips when communicating. Speech is with a scanning quality and dysarthria which daughter states is his baseline due to deafness. Oriented x 5. Speech is fluent without errors of grammar or syntax. Able to name a thumb and had trouble with pinky and index finger, but eventually could find the words. Comprehension intact. Able to recite the months of the year forwards without difficulty, but made several errors trying to recite the months backwards.  Cranial Nerves: II: Temporal visual fields intact with no extinction to DSS. PERRL  III,IV, VI: No ptosis. EOMI with saccadic quality of pursuits noted.  V,VII: Smile symmetric, facial temp sensation equal bilaterally VIII: Severe hearing loss IX,X: Palate rises symmetrically XI: Symmetric shoulder shrug XII: Midline tongue extension Motor: RUE and LUE 5/5 proximally and distally. No pronator drift.  RLE 5/5 proximally and distally LLE 5/5 proximally and distally Normal latencies of motor responses No asymmetry No rest or action tremor Sensory: Temp and light touch intact to BUE and BLE. No extinction to DSS.  Deep Tendon Reflexes:  2+ bilateral brachioradialis and biceps.  2+ right patellar. Low amplitude left patellar with  consistent right adductor response (4+) 0 achilles bilaterally Plantars: Right: downgoing   Left: downgoing Cerebellar: No ataxia with FNF bilaterally  Gait: Deferred   Lab  Results: Basic Metabolic Panel: Recent Labs  Lab 10/20/20 1315  NA 139  K 4.2  CL 109  CO2 21*  GLUCOSE 137*  BUN 34*  CREATININE 1.40*  CALCIUM 10.2    CBC: Recent Labs  Lab 10/20/20 1315  WBC 6.3  NEUTROABS 3.9  HGB 12.5*  HCT 39.1  MCV 92.7  PLT 265    Cardiac Enzymes: No results for input(s): CKTOTAL, CKMB, CKMBINDEX, TROPONINI in the last 168 hours.  Lipid Panel: No results for input(s): CHOL, TRIG, HDL, CHOLHDL, VLDL, LDLCALC in the last 168 hours.  Imaging: CT Head Wo Contrast  Result Date: 10/20/2020 CLINICAL DATA:  Neuro deficit.  Acute stroke suspected. EXAM: CT HEAD WITHOUT CONTRAST TECHNIQUE: Contiguous axial images were obtained from the base of the skull through the vertex without intravenous contrast. COMPARISON:  12/28/2014 FINDINGS: Brain: There is no evidence for acute hemorrhage, hydrocephalus, mass lesion, or abnormal extra-axial fluid collection. No definite CT evidence for acute infarction. Portions of the right temporal and parietal lobes are obscured by beam hardening artifact from right cochlear implant hardware. Diffuse loss of parenchymal volume is consistent with atrophy. Patchy low attenuation in the deep hemispheric and periventricular white matter is nonspecific, but likely reflects chronic microvascular ischemic demyelination. Vascular: No hyperdense vessel or unexpected calcification. Skull: No evidence for fracture. No worrisome lytic or sclerotic lesion. Sinuses/Orbits: Right maxillary sinus is opacified. Opacification of anterior right ethmoid air cells noted. Frontal sinuses are clear. Surgical changes noted right your region consistent with cochlear implant. Other: None. IMPRESSION: 1. Stable exam.  No acute intracranial abnormality. 2. Atrophy with chronic small vessel white matter disease. 3. Right cochlear implant. Electronically Signed   By: Misty Stanley M.D.   On: 10/20/2020 14:37    Assessment: 85 year old male presenting with  transient LLE weakness limiting ambulation, in conjunction with decreased movement of LUE and slurred speech.  1. HIstory suggestive of a TIA. Symptoms now resolved. Neurological exam is nonfocal.  2. CT head: No acute intracranial abnormality. Atrophy with chronic small vessel white matter disease. Right cochlear implant.  3. Unable to obtain MRI due to cochlear implant 4. Stroke risk factors: CKD, history of cancer, CAD, HLD, HTN, prior TIA and DM2  Recommendations: 1. CTA of head and neck 2. TTE.  3. Cardiac telemetry 4. HgbA1c, fasting lipid panel 5. PT consult, OT consult, Speech consult 6. Modified permissive HTN due to advanced age. Treat if SBP > 180. After 24 hours, gradually normalize SBP to goal of 120-140.  7. Classifiable as having failed ASA monotherapy. Add Plavix to antiplatelet regimen.  8. Risk factor modification 9. Frequent neuro checks 10. NPO until passes stroke swallow screen   Electronically signed: Dr. Kerney Elbe 10/20/2020, 6:41 PM

## 2020-10-20 NOTE — H&P (Signed)
History and Physical    Juan [name] VPX:[id number] DOB: 04-Jan-1936 DOA: 10/20/2020  PCP: Binnie Rail, MD  Patient coming from: Home  Chief Complaint: Confusion and shuffling gait   HPI: Juan [name] is a 85 y.o. male with medical history significant of coronary artery disease, gout, hyperlipidemia, hypertension, diabetes who presents to the hospital after being found confused and with a shuffling gait at the grocery store this afternoon.  His daughter at bedside is able to add to the history.  She states that over the past couple of weeks, patient has had increasing frequency of falls at home.  Patient resides at a [facility] independent living facility.  Today, he went to get his haircut, was at the grocery store and states that he felt "off."  Apparently, he had attempted to hand his keys to the cashier instead of a credit card, was weak and needed assistance to sit down until EMS arrived.  Currently in the emergency department, patient is symptom-free and feeling well.  He states the entire episode lasted about 15 minutes.  He denies any recent illnesses, denies any lightheadedness, dizziness, chest pain, shortness of breath, cough, nausea, vomiting, diarrhea.  He was recently seen at urgent care for presumed gout.  He has an appointment to see neurologist outpatient for shuffling gait and frequent falls.  Review of Systems: As per HPI. Otherwise, all other review of systems reviewed and are negative.   Past Medical History:  Diagnosis Date  . Cancer ([place])    skin  . CKD (chronic kidney disease) 06/24/2013  . Coronary artery disease    a. s/p CABG 2000; b. myoview 9/08: inf-septal scar with mild peri-infarct ischemia (low risk);  c.  Echo 8/13: mild focal basal septal hypertrophy, Gr 1 DD, mild LAE;  d. Lexiscan Myoview (06/20/2013): No reversible ischemia, inferior and septal infarct, inferior and septal hypokinesis, EF 60%;  e.  Echo (06/20/2011): EF 50-55%, normal wall motion, grade 1  diastolic dysfunction, mild LAE  . Diverticular disease   . Duodenitis   . Elevated PSA   . GERD (gastroesophageal reflux disease)   . Gout   . Humerus fracture    right  . Hydronephrosis with ureteropelvic junction obstruction   . Hypercalcemia 02/02/12   a. 01/2012: 10.7.  Marland Kitchen Hyperlipidemia   . Hypertension   . Pancreatitis    a. ? Simvastatin related  . Pneumonia age 39 or 77   "I think only once" (06/19/2013)  . Syncope    a. 06/2013  . TIA (transient ischemic attack)    a. 01/2012 - presentation as acute imbalance;  b. 01/2012 carotid U/S w/o signif stenosis;  c. 01/2012 Echo: EF 60-65%, Gr 1 DD.;  d.  Carotid US (06/20/2013): 1-39% bilateral ICA stenosis  . Tubular adenoma polyp of rectum   . Type II diabetes mellitus ([facility])    "diet and exercise controlled at present" (06/19/2013)    Past Surgical History:  Procedure Laterality Date  . APPENDECTOMY    . CARDIAC CATHETERIZATION  2000  . CHOLECYSTECTOMY    . COCHLEAR IMPLANT Right 1990's  . COLONOSCOPY W/ POLYPECTOMY     no polyp 2012  . CORONARY ARTERY BYPASS GRAFT  2000   CABG X5  . INGUINAL HERNIA REPAIR Right 09/15/2015   Procedure: OPEN REPAIR RIGHT INGUINAL HERNIA ;  Surgeon: Jackolyn Confer, MD;  Location: [facility];  Service: General;  Laterality: Right;  . INSERTION OF MESH Right 09/15/2015   Procedure: INSERTION OF  MESH;  Surgeon: Jackolyn Confer, MD;  Location: [facility];  Service: General;  Laterality: Right;  . PARATHYROIDECTOMY  1978  . POLYPECTOMY    . REVERSE SHOULDER ARTHROPLASTY Right 12/30/2014   Procedure: Right shoulder ORIF, Biomet S3 Plate and ZImmer cable system ;  Surgeon: Netta Cedars, MD;  Location: [place];  Service: Orthopedics;  Laterality: Right;  . right ankle surgery  age 15  . TONSILLECTOMY  1943  . trachestomy as child for throat closing       reports that he has quit smoking. His smoking use included cigars. He quit after 0.00 years of use. He has never used smokeless tobacco. He reports current alcohol  use. He reports that he does not use drugs.  Allergies  Allergen Reactions  . Simvastatin     REACTION:  Possible statin induced pancreatitis    Family History  Problem Relation Age of Onset  . Melanoma Father   . Heart attack Father         in 72s  . Diabetes Neg Hx      Prior to Admission medications   Medication Sig Start Date End Date Taking? Authorizing Provider  allopurinol (ZYLOPRIM) 100 MG tablet TAKE 1 TABLET(100 MG) BY MOUTH DAILY 07/01/20   Binnie Rail, MD  amLODipine (NORVASC) 2.5 MG tablet TAKE 1 TABLET(2.5 MG) BY MOUTH DAILY 10/04/20   Binnie Rail, MD  aspirin EC 81 MG tablet Take 81 mg by mouth daily.    [provider]  blood glucose meter kit and supplies KIT Dispense based on patient and insurance preference. Use up to four times daily as directed. (FOR ICD-9 250.00, 250.01). 01/04/17   Golden Circle, FNP  Choline Fenofibrate (FENOFIBRIC ACID) 135 MG CPDR TAKE 1 CAPSULE BY MOUTH DAILY 08/03/20   Binnie Rail, MD  finasteride (PROSCAR) 5 MG tablet Take 5 mg by mouth daily.    [provider]  metFORMIN (GLUCOPHAGE) 500 MG tablet TAKE 1 TABLET(500 MG) BY MOUTH DAILY WITH BREAKFAST 09/27/20   Burns, Claudina Lick, MD  metoprolol tartrate (LOPRESSOR) 50 MG tablet TAKE 1/2 TABLET(25 MG) BY MOUTH TWICE DAILY 08/26/20   Binnie Rail, MD  pravastatin (PRAVACHOL) 20 MG tablet Take 1 tablet (20 mg total) by mouth 3 (three) times a week. 09/07/19   [name], MD    Physical Exam: Vitals:   10/20/20 1550 10/20/20 1600 10/20/20 1615 10/20/20 1645  BP: (!) 145/91 140/78 (!) 162/80 (!) 171/93  Pulse: 68 60 (!) 58 69  Resp: _0 (!) 21  Temp:      TempSrc:      SpO2: 98% 99% 98% 97%      Constitutional: NAD, calm, comfortable Eyes: PERRL, lids and conjunctivae normal ENMT: Mucous membranes are moist. Normal dentition.  Respiratory: Clear to auscultation bilaterally, no wheezing, no crackles. Normal respiratory effort. No accessory muscle  use. No conversational dyspnea  Cardiovascular: Regular rate and rhythm, no murmurs. No extremity edema.  Abdomen: Soft, nondistended, nontender to palpation. Bowel sounds positive.  Musculoskeletal: No joint deformity upper and lower extremities. No contractures. Normal muscle tone.  Skin: no rashes, lesions, ulcers on exposed skin  Neurologic: Alert and oriented, speech fluent, CN 2-12 grossly intact. No focal deficits.   Psychiatric: Normal judgment and insight. Normal mood and affect   Labs on Admission: I have personally reviewed following labs and imaging studies  CBC: Recent Labs  Lab 10/20/20 1315  WBC 6.3  NEUTROABS 3.9  HGB 12.5*  HCT 39.1  MCV 92.7  PLT 664   Basic Metabolic Panel: Recent Labs  Lab 10/20/20 1315  NA 139  K 4.2  CL 109  CO2 21*  GLUCOSE 137*  BUN 34*  CREATININE 1.40*  CALCIUM 10.2   GFR: Estimated Creatinine Clearance: 39.4 mL/min (A) (by C-G formula based on SCr of 1.4 mg/dL (H)). Liver Function Tests: Recent Labs  Lab 10/20/20 1315  AST 17  ALT 14  ALKPHOS 32*  BILITOT 0.4  PROT 6.3*  ALBUMIN 3.7   No results for input(s): LIPASE, AMYLASE in the last 168 hours. No results for input(s): AMMONIA in the last 168 hours. Coagulation Profile: No results for input(s): INR, PROTIME in the last 168 hours. Cardiac Enzymes: No results for input(s): CKTOTAL, CKMB, CKMBINDEX, TROPONINI in the last 168 hours. BNP (last 3 results) No results for input(s): PROBNP in the last 8760 hours. HbA1C: No results for input(s): HGBA1C in the last 72 hours. CBG: No results for input(s): GLUCAP in the last 168 hours. Lipid Profile: No results for input(s): CHOL, HDL, LDLCALC, TRIG, CHOLHDL, LDLDIRECT in the last 72 hours. Thyroid Function Tests: No results for input(s): TSH, T4TOTAL, FREET4, T3FREE, THYROIDAB in the last 72 hours. Anemia Panel: No results for input(s): VITAMINB12, FOLATE, FERRITIN, TIBC, IRON, RETICCTPCT in the last 72 hours. Urine  analysis:    Component Value Date/Time   COLORURINE YELLOW 10/20/2020 1325   APPEARANCEUR CLEAR 10/20/2020 1325   LABSPEC 1.012 10/20/2020 1325   PHURINE 5.0 10/20/2020 1325   GLUCOSEU NEGATIVE 10/20/2020 1325   HGBUR NEGATIVE 10/20/2020 [place] 10/20/2020 1325   [place] 10/20/2020 1325   PROTEINUR NEGATIVE 10/20/2020 1325   UROBILINOGEN 0.2 06/20/2013 0744   NITRITE NEGATIVE 10/20/2020 1325   LEUKOCYTESUR NEGATIVE 10/20/2020 1325   Sepsis Labs: !!!!!!!!!!!!!!!!!!!!!!!!!!!!!!!!!!!!!!!!!!!! _0 (procalcitonin:4,lacticidven:4) )No results found for this or any previous visit (from the past 240 hour(s)).   Radiological Exams on Admission: CT Head Wo Contrast  Result Date: 10/20/2020 CLINICAL DATA:  Neuro deficit.  Acute stroke suspected. EXAM: CT HEAD WITHOUT CONTRAST TECHNIQUE: Contiguous axial images were obtained from the base of the skull through the vertex without intravenous contrast. COMPARISON:  12/28/2014 FINDINGS: Brain: There is no evidence for acute hemorrhage, hydrocephalus, mass lesion, or abnormal extra-axial fluid collection. No definite CT evidence for acute infarction. Portions of the right temporal and parietal lobes are obscured by beam hardening artifact from right cochlear implant hardware. Diffuse loss of parenchymal volume is consistent with atrophy. Patchy low attenuation in the deep hemispheric and periventricular white matter is nonspecific, but likely reflects chronic microvascular ischemic demyelination. Vascular: No hyperdense vessel or unexpected calcification. Skull: No evidence for fracture. No worrisome lytic or sclerotic lesion. Sinuses/Orbits: Right maxillary sinus is opacified. Opacification of anterior right ethmoid air cells noted. Frontal sinuses are clear. Surgical changes noted right your region consistent with cochlear implant. Other: None. IMPRESSION: 1. Stable exam.  No acute intracranial abnormality. 2. Atrophy with  chronic small vessel white matter disease. 3. Right cochlear implant. Electronically Signed   By: Misty Stanley M.D.   On: 10/20/2020 14:37    EKG: Independently reviewed. NSR   Assessment/Plan Principal Problem:   TIA (transient ischemic attack) Active Problems:   History of gout   Essential hypertension, benign   CKD (chronic kidney disease)   Shuffling gait   Diabetes mellitus without complication (HCC)   Recurrent falls    Possible TIA -CTA head stable without acute intracranial abnormality -  Unable to obtain MRI due to cochlear implant -Neurology consulted -PT OT SLP -Continue aspirin, Pravachol -Echo -Carotid US   CKD stage IIIa -Baseline creatinine around 1.3 -Stable  Diabetes mellitus type 2, well controlled -Hemoglobin A1c in March 2022 was 6.7 -Hold metformin -Sliding scale insulin while inpatient  Hypertension -Hold Norvasc, Lopressor in setting of neurologic event  Gout -Continue allopurinol   DVT prophylaxis: Subcutaneous heparin   Code Status: Full code Family Communication: Daughter at bedside Disposition Plan: Discharge back to independent living facility Consults called: Neurology   Status is: Observation  The patient remains OBS appropriate and will d/c before 2 midnights.  Dispo: The patient is from: Independent living facility              Anticipated d/c is to: Independent living facility              Patient currently is not medically stable to d/c.   Difficult to place patient No    Severity of Illness: The appropriate patient status for this patient is OBSERVATION. Observation status is judged to be reasonable and necessary in order to provide the required intensity of service to ensure the patient's safety. The patient's presenting symptoms, physical exam findings, and initial radiographic and laboratory data in the context of their medical condition is felt to place them at decreased risk for further clinical deterioration.  Furthermore, it is anticipated that the patient will be medically stable for discharge from the hospital within 2 midnights of admission.   Dessa Phi, DO [facility] 10/20/2020, 5:21 PM   Available via Epic secure chat 7am-7pm After these hours, please refer to coverage provider listed on [email or web address]

## 2020-10-21 ENCOUNTER — Ambulatory Visit: Payer: Federal, State, Local not specified - PPO | Admitting: Internal Medicine

## 2020-10-21 ENCOUNTER — Observation Stay (HOSPITAL_BASED_OUTPATIENT_CLINIC_OR_DEPARTMENT_OTHER): Payer: Federal, State, Local not specified - PPO

## 2020-10-21 ENCOUNTER — Observation Stay (HOSPITAL_COMMUNITY): Payer: Federal, State, Local not specified - PPO

## 2020-10-21 DIAGNOSIS — I10 Essential (primary) hypertension: Secondary | ICD-10-CM | POA: Diagnosis not present

## 2020-10-21 DIAGNOSIS — G459 Transient cerebral ischemic attack, unspecified: Secondary | ICD-10-CM

## 2020-10-21 LAB — BASIC METABOLIC PANEL
Anion gap: 7 (ref 5–15)
BUN: 28 mg/dL — ABNORMAL HIGH (ref 8–23)
CO2: 25 mmol/L (ref 22–32)
Calcium: 10 mg/dL (ref 8.9–10.3)
Chloride: 108 mmol/L (ref 98–111)
Creatinine, Ser: 1.46 mg/dL — ABNORMAL HIGH (ref 0.61–1.24)
GFR, Estimated: 48 mL/min — ABNORMAL LOW (ref >60)
Glucose, Bld: 121 mg/dL — ABNORMAL HIGH (ref 70–99)
Potassium: 3.5 mmol/L (ref 3.5–5.1)
Sodium: 140 mmol/L (ref 135–145)

## 2020-10-21 LAB — LIPID PANEL
Cholesterol: 135 mg/dL (ref 0–200)
HDL: 31 mg/dL — ABNORMAL LOW (ref >40)
LDL Cholesterol: 75 mg/dL (ref 0–99)
Total CHOL/HDL Ratio: 4.4 RATIO
Triglycerides: 145 mg/dL (ref <150)
VLDL: 29 mg/dL (ref 0–40)

## 2020-10-21 LAB — GLUCOSE, CAPILLARY
Glucose-Capillary: 124 mg/dL — ABNORMAL HIGH (ref 70–99)
Glucose-Capillary: 135 mg/dL — ABNORMAL HIGH (ref 70–99)
Glucose-Capillary: 114 mg/dL — ABNORMAL HIGH (ref 70–99)
Glucose-Capillary: 127 mg/dL — ABNORMAL HIGH (ref 70–99)

## 2020-10-21 LAB — ECHOCARDIOGRAM COMPLETE
Area-P 1/2: 2.05 cm2
S' Lateral: 3 cm

## 2020-10-21 LAB — HEMOGLOBIN A1C
Hgb A1c MFr Bld: 7.1 % — ABNORMAL HIGH (ref 4.8–5.6)
Mean Plasma Glucose: 157.07 mg/dL

## 2020-10-21 MED ORDER — IOHEXOL 350 MG/ML SOLN
60.0000 mL | Freq: Once | INTRAVENOUS | Status: AC | PRN
  Administered 2020-10-21: 60 mL via INTRAVENOUS

## 2020-10-21 MED ORDER — CLOPIDOGREL BISULFATE 75 MG PO TABS
75.0000 mg | ORAL_TABLET | Freq: Every day | ORAL | Status: DC
  Administered 2020-10-21 – 2020-10-25 (×5): 75 mg via ORAL
  Filled 2020-10-21 (×5): qty 1

## 2020-10-21 NOTE — Progress Notes (Addendum)
STROKE TEAM PROGRESS NOTE  Brieflty: 85 year old male presenting with transient LLE weakness limiting ambulation, in conjunction with decreased movement of LUE and slurred speech noted by staff at [facility] where he was shopping at the time. He presented to the ED for this episode.  INTERVAL HISTORY No acute events  His daughter is at the bedside.   He has a cochlear implant and is dysarthric at baseline per daughter.  He is a lip reader. Communication is impaired by his hearing loss.  Today he is feeling much better. He reports continued LLE pain, reports some intermittent back pain as well. Daughter relates that there has been 5 falls in the past 3 weeks. She reports the [facility] staff told her he was trying to pay with his keys and seemed confused. Patient describes these falls as legs just giving out on him. He denies any pre-syncopal symptoms such as vision changes, lightheadedness, dizziness, chest pain, shortness of breath or palpitations. He also does not recall feeling confused or having trouble with speech.  He endorses intermittent back pain ongoing left knee pain as well. Knee Xray obtained at last fall 4/28 was negative. He live in an assisted living type setting but is independent. With the recent falls she worries the setting is not adequate.  Privately in the hallway daughter shares that she has had to take over appointments and financial issues over the past six months as he has declined. 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 No formal diagnosis of dementia or medications on board. We discussed his ongoing stroke work up and plan of care. Questions were addressed.   Vitals:   10/21/20 0116 10/21/20 0518 10/21/20 0804 10/21/20 1223  BP: (!) 144/85 137/84 (!) 148/83 (!) 145/77  Pulse: 73 83 72 86  Resp: 16 18 17 19   Temp: 97.9 F (36.6 C) 97.9 F (36.6 C) 97.9 F (36.6 C) 97.9 F (36.6 C)   TempSrc: Oral Oral Oral Oral  SpO2: 94% 99% 97% 98%   CBC:  Recent Labs  Lab 10/20/20 1315  WBC 6.3  NEUTROABS 3.9  HGB 12.5*  HCT 39.1  MCV 92.7  PLT 536   Basic Metabolic Panel:  Recent Labs  Lab 10/20/20 1315 10/21/20 0259  NA 139 140  K 4.2 3.5  CL 109 108  CO2 21* 25  GLUCOSE 137* 121*  BUN 34* 28*  CREATININE 1.40* 1.46*  CALCIUM 10.2 10.0   Lipid Panel:  Recent Labs  Lab 10/21/20 0259  CHOL 135  TRIG 145  HDL 31*  CHOLHDL 4.4  VLDL 29  LDLCALC 75   HgbA1c:  Recent Labs  Lab 10/21/20 0259  HGBA1C 7.1*   Urine Drug Screen: No results for input(s): LABOPIA, COCAINSCRNUR, LABBENZ, AMPHETMU, THCU, LABBARB in the last 168 hours.  Alcohol Level No results for input(s): ETH in the last 168 hours.  IMAGING past 24 hours ECHOCARDIOGRAM COMPLETE  Result Date: 10/21/2020    ECHOCARDIOGRAM REPORT   Patient Name:   Juan [name] Date of Exam: 10/21/2020 Medical Rec #:  [id number]    Height:       68.0 in Accession #:    [id number]   Weight:       174.8 lb Date of Birth:  Mar 25, 1936    BSA:          1.930 m Patient Age:    76 years  BP:           137/84 mmHg Patient Gender: M            HR:           67 bpm. Exam Location:  Inpatient Procedure: 2D Echo, Color Doppler and Cardiac Doppler Indications:    TIA G45.9  History:        Patient has prior history of Echocardiogram examinations, most                 recent 06/19/2013. CAD, TIA, Signs/Symptoms:Syncope; Risk                 Factors:Diabetes, Hypertension and Dyslipidemia.  Sonographer:    Bernadene Person RDCS Referring Phys: [id number] [place]  1. Left ventricular ejection fraction, by estimation, is 60 to 65%. The left ventricle has normal function. The left ventricle has no regional wall motion abnormalities. Left ventricular diastolic parameters were normal.  2. Right ventricular systolic function is normal. The right ventricular size is normal.  3. The IAS appears hypermobile. Borderline aneurysmal. No  definitive PFO.  4. The mitral valve is grossly normal. Trivial mitral valve regurgitation. No evidence of mitral stenosis.  5. The aortic valve is tricuspid. There is mild calcification of the aortic valve. There is mild thickening of the aortic valve. Aortic valve regurgitation is not visualized. Mild aortic valve sclerosis is present, with no evidence of aortic valve stenosis.  6. The inferior vena cava is normal in size with greater than 50% respiratory variability, suggesting right atrial pressure of 3 mmHg. Conclusion(s)/Recommendation(s): No intracardiac source of embolism detected on this transthoracic study. A transesophageal echocardiogram is recommended to exclude cardiac source of embolism if clinically indicated. FINDINGS  Left Ventricle: Left ventricular ejection fraction, by estimation, is 60 to 65%. The left ventricle has normal function. The left ventricle has no regional wall motion abnormalities. The left ventricular internal cavity size was normal in size. There is  no left ventricular hypertrophy. Left ventricular diastolic parameters were normal. Right Ventricle: The right ventricular size is normal. No increase in right ventricular wall thickness. Right ventricular systolic function is normal. Left Atrium: Left atrial size was normal in size. Right Atrium: Right atrial size was normal in size. Pericardium: Trivial pericardial effusion is present. Presence of pericardial fat pad. Mitral Valve: The mitral valve is grossly normal. Trivial mitral valve regurgitation. No evidence of mitral valve stenosis. Tricuspid Valve: The tricuspid valve is grossly normal. Tricuspid valve regurgitation is trivial. No evidence of tricuspid stenosis. Aortic Valve: The aortic valve is tricuspid. There is mild calcification of the aortic valve. There is mild thickening of the aortic valve. Aortic valve regurgitation is not visualized. Mild aortic valve sclerosis is present, with no evidence of aortic valve  stenosis. Pulmonic Valve: The pulmonic valve was grossly normal. Pulmonic valve regurgitation is not visualized. No evidence of pulmonic stenosis. Aorta: The aortic root and ascending aorta are structurally normal, with no evidence of dilitation. Venous: The inferior vena cava is normal in size with greater than 50% respiratory variability, suggesting right atrial pressure of 3 mmHg. IAS/Shunts: There is redundancy of the interatrial septum. The atrial septum is grossly normal.  LEFT VENTRICLE PLAX 2D LVIDd:         4.50 cm  Diastology LVIDs:         3.00 cm  LV e' medial:    8.27 cm/s LV PW:         1.00 cm  LV  E/e' medial:  6.0 LV IVS:        1.00 cm  LV e' lateral:   9.32 cm/s LVOT diam:     2.20 cm  LV E/e' lateral: 5.4 LV SV:         80 LV SV Index:   42 LVOT Area:     3.80 cm  RIGHT VENTRICLE RV S prime:     12.50 cm/s TAPSE (M-mode): 1.9 cm LEFT ATRIUM             Index       RIGHT ATRIUM           Index LA diam:        3.80 cm 1.97 cm/m  RA Area:     13.70 cm LA Vol (A2C):   34.3 ml 17.77 ml/m RA Volume:   27.50 ml  14.25 ml/m LA Vol (A4C):   24.0 ml 12.43 ml/m LA Biplane Vol: 30.9 ml 16.01 ml/m  AORTIC VALVE LVOT Vmax:   124.00 cm/s LVOT Vmean:  80.700 cm/s LVOT VTI:    0.211 m  AORTA Ao Root diam: 3.50 cm Ao Asc diam:  3.90 cm MITRAL VALVE MV Area (PHT): 2.05 cm    SHUNTS MV Decel Time: 370 msec    Systemic VTI:  0.21 m MV E velocity: 49.90 cm/s  Systemic Diam: 2.20 cm MV A velocity: 70.60 cm/s MV E/A ratio:  0.71 Eleonore Chiquito MD Electronically signed by Eleonore Chiquito MD Signature Date/Time: 10/21/2020/2:50:55 PM    Final    PHYSICAL EXAM Mental Status: Awake and alert. Primarily reads lips when communicating. Speech is dysarthric (baseline). Oriented x 5. Speech is fluent. Names 3/3.  Repeat 5 word sentence. Follows 2 step command but not 3 step. Comprehension intact.  Cranial Nerves: II: VFF.   PERRL  III,IV, VI: No ptosis. EOMI with saccadic quality of pursuits noted.  V,VII: Smile  symmetric, facial temp sensation equal bilaterally VIII: Severe hearing loss IX,X: Palate rises symmetrically XI: Symmetric shoulder shrug XII: Midline tongue extension Motor: RUE and LUE 5/5 proximally and distally. No pronator drift.  RLE 5/5 proximally and distally LLE 5/5 proximally and distally No asymmetry No rest or action tremor Sensory: Temp and light touch intact to BUE and BLE. No extinction to DSS.  Plantars: Right: downgoing                           Left: downgoing Cerebellar: No ataxia with FNF bilaterally  Gait: Deferred   ASSESSMENT/PLAN 85 year old male with PMH of DM2, HTN, HLD, former smoker, hearing loss with cochlear implant now presenting with stroke like episode/TIA consisting of transient LLE weakness limiting ambulation, in conjunction with decreased movement of LUE and slurred speech Stroke work up ongoing but limited by inability to obtain MRI due to cochlear implant.    Code Stroke CT head: No acute abnormality.   CTA head & neck: pending  MRI: unable to obtain due to cochlear implant  2D Echo EF 60-65%, The IAS appears hypermobile. Borderline aneurysmal. No definitive PFO. No thrombus, wall motion abnormality.     LDL 75  HgbA1c 7.1  VTE prophylaxis - heparin sq on board     Diet   Diet Carb Modified Fluid consistency: Thin; Room service appropriate? Yes   ASA 81mg  prior to admission  Therapy recommendations:  TBD  Disposition:  TBD  Hypertension  Home meds:  Lopressor, norvasc . Long-term BP goal normotensive  Hyperlipidemia  Home meds:  Pravachol 20mg    LDL 75, goal < 70  High intensity statin: not clear if indicated at this time  Continue statin at discharge  Back pain Leg weakness  5 falls in past 3 weeks  Intermittent lumbar pain   Gait disturbance  Consider lumbar spine imaging with MRI   Cognitive decline  Has lost ability to manage own appointments and finances in past 6 months  Daughter reports slow,  steady decline  Neurology follow up   Diabetes type II Uncontrolled  HgbA1c 7.1, goal < 7.0  CBGs Recent Labs    10/21/20 0613 10/21/20 1058 10/21/20 1603  GLUCAP 124* 135* 114*      Management Per primary team   Other Stroke Risk Factors  Advanced Age >/= 60   Former Cigar smoker  Current ETOH use, alcohol level. advised to drink no more than 2 drink(s) a day  Other Active Problems  Hospital day # 0 This plan of care was directed by Dr. Erlinda Hong.  Hetty Blend, NP-C  ATTENDING NOTE: I reviewed above note and agree with the assessment and plan. Pt was seen and examined.   85 year old male with history of CKD, CAD status post CABG, hypertension, hyperlipidemia, DM, TIA admitted for worsening slurred speech and left lower extremity weakness causing fall.  Patient has severe hearing loss since age 53, per daughter, patient baseline speech was slurred and difficulty with pronunciation.  Patient has frequent fall recently, complaining of left lower extremity give out with weakness and pain due to recent gout.  Once takes Tylenol, patient weakness and pain goes away normally.  However, denies lower back pain or buttock pain.    On this admission, patient daughter felt patient has some worsening slurred speech and the following because left lower extremity weakness.  Symptoms resolved on ED arrival.  CT no acute abnormality.  Not able to have MRI due to cochlear implant.  CTA head and neck and repeat CT pending.  EF 60 to 65%.  LDL 75, A1c 7.1.   On exam, patient awake alert, orientated.  No aphasia, however, severe dysarthria with difficulty understanding.  Daughter has to repeat answers for me most of the time.  However, patient daughter stated that was normal for him.  No focal deficit.  However left lower extremity has old ecchymosis and looks bigger than the right concerning for some swelling. No pain on palpation.  Given reported pain on the left lower extremity, will check  left LE venous Doppler to rule out DVT.  Given patient significant stroke risk factor to, TIA cannot be completely ruled out, recommend aspirin 81, Plavix 75 DAPT for 3 weeks and then aspirin alone after.  Continue pravastatin.  PT/OT recommend home health PT/OT.  Will follow.   Rosalin Hawking, MD PhD Stroke Neurology 10/21/2020 8:52 PM     To contact Stroke Continuity provider, please refer to [email or web address] After hours, contact General Neurology

## 2020-10-21 NOTE — Progress Notes (Signed)
  Echocardiogram 2D Echocardiogram has been performed.  Juan [name] 10/21/2020, 10:46 AM

## 2020-10-21 NOTE — Evaluation (Signed)
Physical Therapy Evaluation Patient Details Name: Juan [name] MRN: [id number] DOB: 08/01/1935 Today's Date: 10/21/2020   History of Present Illness  85 y.o. male presenting to ED on 5/5 with AMS and gait disturbance resolving after 15 min. before resolving. Patient/family also note frequent falls x1 wk. CT  (-) for acute findings. Further work-up pending. PMHx significant for Hx of TIA 2013, CAD s/p CABG 2000, CKD, HLD, HTN and DM II.    Clinical Impression  Pt admitted with above diagnosis. PTA pt resided at [facility]. Pt still drives and does his own grocery shopping. On eval, pt required min assist transfers and min guard assist ambulation 150'. He uses his cane to power up to stand, but just carries it off the ground during ambulation. He presents with a shuffle gait pattern and balance deficits. Son in law reports multiple recent falls.  Pt currently with functional limitations due to the deficits listed below (see PT Problem List). Pt will benefit from skilled PT to increase their independence and safety with mobility to allow discharge to the venue listed below.  Pt most appropriate for transition to ALF at this time due to falls. Family reports pt has been resistant to the transition.      Follow Up Recommendations Home health PT    Equipment Recommendations  None recommended by PT    Recommendations for Other Services       Precautions / Restrictions Precautions Precautions: Fall Precaution Comments: High fall risk Restrictions Weight Bearing Restrictions: No      Mobility  Bed Mobility Overal bed mobility: Needs Assistance Bed Mobility: Supine to Sit;Sit to Supine     Supine to sit: Supervision;HOB elevated Sit to supine: Supervision;HOB elevated   General bed mobility comments: increased time and effort, supervision for safety    Transfers Overall transfer level: Needs assistance Equipment used: Straight cane Transfers: Sit to/from Stand Sit to Stand:  Min assist         General transfer comment: assist to power up  Ambulation/Gait Ambulation/Gait assistance: Min guard Gait Distance (Feet): 150 Feet Assistive device: Straight cane Gait Pattern/deviations: Shuffle Gait velocity: decreased Gait velocity interpretation: 1.31 - 2.62 ft/sec, indicative of limited community ambulator General Gait Details: Shuffle gait pattern. Pt uses SPC to power up to stand, but just carries it off the ground during  ambulation.  Stairs            Wheelchair Mobility    Modified Rankin (Stroke Patients Only) Modified Rankin (Stroke Patients Only) Pre-Morbid Rankin Score: Slight disability Modified Rankin: Moderate disability     Balance Overall balance assessment: Needs assistance Sitting-balance support: No upper extremity supported;Feet supported Sitting balance-Leahy Scale: Good Sitting balance - Comments: Patient able to maintain sitting balance at EOB without external assist. Able to reach outside of BOS in sitting without LOB.   Standing balance support: Single extremity supported;During functional activity Standing balance-Leahy Scale: Fair Standing balance comment: Patient requires external assist to maintain dynamic standing balance. Does not use SPC properly increasing fall risk.                             Pertinent Vitals/Pain Pain Assessment: No/denies pain    Home Living Family/patient expects to be discharged to:: Other (Comment) ([facility]) Living Arrangements: Alone Available Help at Discharge: Family;Available PRN/intermittently Type of Home: Independent living facility Home Access: Level entry     Home Layout: One level Home Equipment:  Walker - standard;Cane - single point;Bedside commode;Shower seat - built in;Grab bars - tub/shower;Hand held shower head      Prior Function Level of Independence: Independent with assistive device(s)         Comments: Per patient report he was  independent with ADLs/IADLs and drives. Paid assist with heavy cleaning every 3 weeks.     Hand Dominance   Dominant Hand: Right    Extremity/Trunk Assessment   Upper Extremity Assessment Upper Extremity Assessment: Overall WFL for tasks assessed    Lower Extremity Assessment Lower Extremity Assessment: Overall WFL for tasks assessed    Cervical / Trunk Assessment Cervical / Trunk Assessment: Kyphotic  Communication   Communication: Deaf (Pt has cochlear implant but prefers to read lips.)  Cognition Arousal/Alertness: Awake/alert Behavior During Therapy: WFL for tasks assessed/performed Overall Cognitive Status: Impaired/Different from baseline Area of Impairment: Safety/judgement;Problem solving                         Safety/Judgement: Decreased awareness of safety;Decreased awareness of deficits   Problem Solving: Slow processing General Comments: Patient able to answer home set-up quetions with some inconsistencies noted several times. Patient initially states that he no longer drives, then states that he does drive. Patient also initially states ability for 24hr supervision/assist post d/c but then states ability for only PRN supervision/assist. Patient able to follow 2-step verbal commands with good accuracy.      General Comments General comments (skin integrity, edema, etc.): SIL present at beginning of session to confirm pt appears to be at baseline for mobility. He, however, has had multiple recents falls. Pt has been resistant to transitioning from ILF to ALF.    Exercises     Assessment/Plan    PT Assessment Patient needs continued PT services  PT Problem List Decreased mobility;Decreased safety awareness;Decreased balance       PT Treatment Interventions Therapeutic activities;Gait training;Therapeutic exercise;Patient/family education;Balance training;Functional mobility training    PT Goals (Current goals can be found in the Care Plan section)   Acute Rehab PT Goals Patient Stated Goal: return to ILF PT Goal Formulation: With patient Time For Goal Achievement: 11/04/20 Potential to Achieve Goals: Good    Frequency Min 3X/week   Barriers to discharge        Co-evaluation               AM-PAC PT "6 Clicks" Mobility  Outcome Measure Help needed turning from your back to your side while in a flat bed without using bedrails?: None Help needed moving from lying on your back to sitting on the side of a flat bed without using bedrails?: None Help needed moving to and from a bed to a chair (including a wheelchair)?: A Little Help needed standing up from a chair using your arms (e.g., wheelchair or bedside chair)?: A Little Help needed to walk in hospital room?: A Little Help needed climbing 3-5 steps with a railing? : A Lot 6 Click Score: 19    End of Session Equipment Utilized During Treatment: Gait belt Activity Tolerance: Patient tolerated treatment well Patient left: in bed;with call bell/phone within reach;with bed alarm set Nurse Communication: Mobility status PT Visit Diagnosis: History of falling (Z91.81);Difficulty in walking, not elsewhere classified (R26.2)    Time: 8295-6213 PT Time Calculation (min) (ACUTE ONLY): 17 min   Charges:   PT Evaluation $PT Eval Moderate Complexity: 1 Mod          Lorrin Goodell, PT  Office # [phone] Pager [phone]   Lorriane Shire 10/21/2020, 11:05 AM

## 2020-10-21 NOTE — Progress Notes (Signed)
Progress Note    Juan [name]  [id number] DOB: Dec 02, 1935  DOA: 10/20/2020 PCP: Binnie Rail, MD    Brief Narrative:     Medical records reviewed and are as summarized below:  Juan [name] is an 85 y.o. male with medical history significant of coronary artery disease, gout, hyperlipidemia, hypertension, diabetes who presents to the hospital after being found confused and with a shuffling gait at the grocery store this afternoon.  His daughter at bedside is able to add to the history.  She states that over the past couple of weeks, patient has had increasing frequency of falls at home.  Patient resides at a [facility] independent living facility.  Today, he went to get his haircut, was at the grocery store and states that he felt "off.  Assessment/Plan:   Principal Problem:   TIA (transient ischemic attack) Active Problems:   History of gout   Essential hypertension, benign   CKD (chronic kidney disease)   Shuffling gait   Diabetes mellitus without complication (HCC)   Recurrent falls   Possible TIA -CT head stable without acute intracranial abnormality -Unable to obtain MRI due to cochlear implant -CTA ordered and STILL pending -Neurology consulted -PT OT SLP- home health -Continue aspirin, Pravachol -Echo: No intracardiac source of embolism  detected on this transthoracic study. A transesophageal echocardiogram is  recommended to exclude cardiac source of embolism if clinically indicated.   CKD stage IIIa -Baseline creatinine around 1.3 -recheck in AM  Diabetes mellitus type 2, well controlled -Hemoglobin A1c in March 2022 was 6.7 -Hold metformin -Sliding scale insulin while inpatient  Hypertension -Hold Norvasc, Lopressor in setting of neurologic event  Gout -Continue allopurinol   Family Communication/Anticipated D/C date and plan/Code Status   DVT prophylaxis: Lovenox ordered. Code Status: Full Code.  Family Communication: at  bedside Disposition Plan: Status is: Observation  The patient remains OBS appropriate and will d/c before 2 midnights.  Dispo: The patient is from: Home              Anticipated d/c is to: Home              Patient currently is not medically stable to d/c.   Difficult to place patient No         Medical Consultants:    neurology     Subjective:   Would like to go home tonight  Objective:    Vitals:   10/21/20 0116 10/21/20 0518 10/21/20 0804 10/21/20 1223  BP: (!) 144/85 137/84 (!) 148/83 (!) 145/77  Pulse: 73 83 72 86  Resp: 16 18 17 19   Temp: 97.9 F (36.6 C) 97.9 F (36.6 C) 97.9 F (36.6 C) 97.9 F (36.6 C)  TempSrc: Oral Oral Oral Oral  SpO2: 94% 99% 97% 98%    Intake/Output Summary (Last 24 hours) at 10/21/2020 1523 Last data filed at 10/20/2020 1626 Gross per 24 hour  Intake --  Output 400 ml  Net -400 ml   There were no vitals filed for this visit.  Exam:  General: Appearance:     Overweight male in no acute distress- reads lips     Lungs:     respirations unlabored  Heart:    Normal heart rate.   MS:   All extremities are intact.   Neurologic:   Awake, alert    Data Reviewed:   I have personally reviewed following labs and imaging studies:  Labs: Labs show the following:  Basic Metabolic Panel: Recent Labs  Lab 10/20/20 1315 10/21/20 0259  NA 139 140  K 4.2 3.5  CL 109 108  CO2 21* 25  GLUCOSE 137* 121*  BUN 34* 28*  CREATININE 1.40* 1.46*  CALCIUM 10.2 10.0   GFR Estimated Creatinine Clearance: 37.7 mL/min (A) (by C-G formula based on SCr of 1.46 mg/dL (H)). Liver Function Tests: Recent Labs  Lab 10/20/20 1315  AST 17  ALT 14  ALKPHOS 32*  BILITOT 0.4  PROT 6.3*  ALBUMIN 3.7   No results for input(s): LIPASE, AMYLASE in the last 168 hours. No results for input(s): AMMONIA in the last 168 hours. Coagulation profile No results for input(s): INR, PROTIME in the last 168 hours.  CBC: Recent Labs  Lab  10/20/20 1315  WBC 6.3  NEUTROABS 3.9  HGB 12.5*  HCT 39.1  MCV 92.7  PLT 265   Cardiac Enzymes: No results for input(s): CKTOTAL, CKMB, CKMBINDEX, TROPONINI in the last 168 hours. BNP (last 3 results) No results for input(s): PROBNP in the last 8760 hours. CBG: Recent Labs  Lab 10/20/20 2142 10/21/20 0613 10/21/20 1058  GLUCAP 123* 124* 135*   D-Dimer: No results for input(s): DDIMER in the last 72 hours. Hgb A1c: Recent Labs    10/21/20 0259  HGBA1C 7.1*   Lipid Profile: Recent Labs    10/21/20 0259  CHOL 135  HDL 31*  LDLCALC 75  TRIG 145  CHOLHDL 4.4   Thyroid function studies: No results for input(s): TSH, T4TOTAL, T3FREE, THYROIDAB in the last 72 hours.  Invalid input(s): FREET3 Anemia work up: No results for input(s): VITAMINB12, FOLATE, FERRITIN, TIBC, IRON, RETICCTPCT in the last 72 hours. Sepsis Labs: Recent Labs  Lab 10/20/20 1315  WBC 6.3    Microbiology Recent Results (from the past 240 hour(s))  SARS CORONAVIRUS 2 (TAT 6-24 HRS) Nasopharyngeal Nasopharyngeal Swab     Status: None   Collection Time: 10/20/20  5:06 PM   Specimen: Nasopharyngeal Swab  Result Value Ref Range Status   SARS Coronavirus 2 NEGATIVE NEGATIVE Final    Comment: (NOTE) SARS-CoV-2 target nucleic acids are NOT DETECTED.  The SARS-CoV-2 RNA is generally detectable in upper and lower respiratory specimens during the acute phase of infection. Negative results do not preclude SARS-CoV-2 infection, do not rule out co-infections with other pathogens, and should not be used as the sole basis for treatment or other patient management decisions. Negative results must be combined with clinical observations, patient history, and epidemiological information. The expected result is Negative.  Fact Sheet for Patients: SugarRoll.be  Fact Sheet for Healthcare Providers: https://www.woods-mathews.com/  This test is not yet approved or  cleared by the [place] FDA and  has been authorized for detection and/or diagnosis of SARS-CoV-2 by FDA under an Emergency Use Authorization (EUA). This EUA will remain  in effect (meaning this test can be used) for the duration of the COVID-19 declaration under Se ction 564(b)(1) of the Act, 21 U.S.C. section 360bbb-3(b)(1), unless the authorization is terminated or revoked sooner.  Performed at [facility], [place] [address] [place], [place] [address]     Procedures and diagnostic studies:  CT Head Wo Contrast  Result Date: 10/20/2020 CLINICAL DATA:  Neuro deficit.  Acute stroke suspected. EXAM: CT HEAD WITHOUT CONTRAST TECHNIQUE: Contiguous axial images were obtained from the base of the skull through the vertex without intravenous contrast. COMPARISON:  12/28/2014 FINDINGS: Brain: There is no evidence for acute hemorrhage, hydrocephalus, mass lesion, or  abnormal extra-axial fluid collection. No definite CT evidence for acute infarction. Portions of the right temporal and parietal lobes are obscured by beam hardening artifact from right cochlear implant hardware. Diffuse loss of parenchymal volume is consistent with atrophy. Patchy low attenuation in the deep hemispheric and periventricular white matter is nonspecific, but likely reflects chronic microvascular ischemic demyelination. Vascular: No hyperdense vessel or unexpected calcification. Skull: No evidence for fracture. No worrisome lytic or sclerotic lesion. Sinuses/Orbits: Right maxillary sinus is opacified. Opacification of anterior right ethmoid air cells noted. Frontal sinuses are clear. Surgical changes noted right your region consistent with cochlear implant. Other: None. IMPRESSION: 1. Stable exam.  No acute intracranial abnormality. 2. Atrophy with chronic small vessel white matter disease. 3. Right cochlear implant. Electronically Signed   By: Misty Stanley M.D.   On: 10/20/2020 14:37   ECHOCARDIOGRAM COMPLETE  Result  Date: 10/21/2020    ECHOCARDIOGRAM REPORT   Patient Name:   JERAMI TAMMEN Date of Exam: 10/21/2020 Medical Rec #:  [id number]    Height:       68.0 in Accession #:    [id number]   Weight:       174.8 lb Date of Birth:  29-Nov-1935    BSA:          1.930 m Patient Age:    17 years     BP:           137/84 mmHg Patient Gender: M            HR:           67 bpm. Exam Location:  Inpatient Procedure: 2D Echo, Color Doppler and Cardiac Doppler Indications:    TIA G45.9  History:        Patient has prior history of Echocardiogram examinations, most                 recent 06/19/2013. CAD, TIA, Signs/Symptoms:Syncope; Risk                 Factors:Diabetes, Hypertension and Dyslipidemia.  Sonographer:    Bernadene Person RDCS Referring Phys: [id number] [place]  1. Left ventricular ejection fraction, by estimation, is 60 to 65%. The left ventricle has normal function. The left ventricle has no regional wall motion abnormalities. Left ventricular diastolic parameters were normal.  2. Right ventricular systolic function is normal. The right ventricular size is normal.  3. The IAS appears hypermobile. Borderline aneurysmal. No definitive PFO.  4. The mitral valve is grossly normal. Trivial mitral valve regurgitation. No evidence of mitral stenosis.  5. The aortic valve is tricuspid. There is mild calcification of the aortic valve. There is mild thickening of the aortic valve. Aortic valve regurgitation is not visualized. Mild aortic valve sclerosis is present, with no evidence of aortic valve stenosis.  6. The inferior vena cava is normal in size with greater than 50% respiratory variability, suggesting right atrial pressure of 3 mmHg. Conclusion(s)/Recommendation(s): No intracardiac source of embolism detected on this transthoracic study. A transesophageal echocardiogram is recommended to exclude cardiac source of embolism if clinically indicated. FINDINGS  Left Ventricle: Left ventricular ejection fraction, by estimation,  is 60 to 65%. The left ventricle has normal function. The left ventricle has no regional wall motion abnormalities. The left ventricular internal cavity size was normal in size. There is  no left ventricular hypertrophy. Left ventricular diastolic parameters were normal. Right Ventricle: The right ventricular size is normal. No increase in right ventricular wall thickness. Right  ventricular systolic function is normal. Left Atrium: Left atrial size was normal in size. Right Atrium: Right atrial size was normal in size. Pericardium: Trivial pericardial effusion is present. Presence of pericardial fat pad. Mitral Valve: The mitral valve is grossly normal. Trivial mitral valve regurgitation. No evidence of mitral valve stenosis. Tricuspid Valve: The tricuspid valve is grossly normal. Tricuspid valve regurgitation is trivial. No evidence of tricuspid stenosis. Aortic Valve: The aortic valve is tricuspid. There is mild calcification of the aortic valve. There is mild thickening of the aortic valve. Aortic valve regurgitation is not visualized. Mild aortic valve sclerosis is present, with no evidence of aortic valve stenosis. Pulmonic Valve: The pulmonic valve was grossly normal. Pulmonic valve regurgitation is not visualized. No evidence of pulmonic stenosis. Aorta: The aortic root and ascending aorta are structurally normal, with no evidence of dilitation. Venous: The inferior vena cava is normal in size with greater than 50% respiratory variability, suggesting right atrial pressure of 3 mmHg. IAS/Shunts: There is redundancy of the interatrial septum. The atrial septum is grossly normal.  LEFT VENTRICLE PLAX 2D LVIDd:         4.50 cm  Diastology LVIDs:         3.00 cm  LV e' medial:    8.27 cm/s LV PW:         1.00 cm  LV E/e' medial:  6.0 LV IVS:        1.00 cm  LV e' lateral:   9.32 cm/s LVOT diam:     2.20 cm  LV E/e' lateral: 5.4 LV SV:         80 LV SV Index:   42 LVOT Area:     3.80 cm  RIGHT VENTRICLE RV S  prime:     12.50 cm/s TAPSE (M-mode): 1.9 cm LEFT ATRIUM             Index       RIGHT ATRIUM           Index LA diam:        3.80 cm 1.97 cm/m  RA Area:     13.70 cm LA Vol (A2C):   34.3 ml 17.77 ml/m RA Volume:   27.50 ml  14.25 ml/m LA Vol (A4C):   24.0 ml 12.43 ml/m LA Biplane Vol: 30.9 ml 16.01 ml/m  AORTIC VALVE LVOT Vmax:   124.00 cm/s LVOT Vmean:  80.700 cm/s LVOT VTI:    0.211 m  AORTA Ao Root diam: 3.50 cm Ao Asc diam:  3.90 cm MITRAL VALVE MV Area (PHT): 2.05 cm    SHUNTS MV Decel Time: 370 msec    Systemic VTI:  0.21 m MV E velocity: 49.90 cm/s  Systemic Diam: 2.20 cm MV A velocity: 70.60 cm/s MV E/A ratio:  0.71 Eleonore Chiquito MD Electronically signed by Eleonore Chiquito MD Signature Date/Time: 10/21/2020/2:50:55 PM    Final     Medications:   . allopurinol  100 mg Oral Daily  . aspirin EC  81 mg Oral Daily  . clopidogrel  75 mg Oral Daily  . finasteride  5 mg Oral Daily  . heparin  5,000 Units Subcutaneous Q8H  . insulin aspart  0-9 Units Subcutaneous TID WC  . pravastatin  20 mg Oral Once per day on Mon Wed Fri   Continuous Infusions: . sodium chloride       LOS: 0 days   Geradine Girt  [facility]   How to contact the [facility] Attending  or Consulting provider [place] or covering provider during after hours [place], for this patient?  1. Check the care team in [facility] and look for a) attending/consulting [facility] provider listed and b) the [facility] team listed 2. Log into [email or web address] and use 's universal password to access. If you do not have the password, please contact the hospital operator. 3. Locate the [facility] provider you are looking for under [facility] and page to a number that you can be directly reached. 4. If you still have difficulty reaching the provider, please page the [facility] (Director on Call) for the Hospitalists listed on amion for assistance.  10/21/2020, 3:23 PM

## 2020-10-21 NOTE — Evaluation (Signed)
SLP Cancellation Note  Patient Details Name: AMADU SCHLAGETER MRN: [id number] DOB: [birth year]   Cancelled treatment:        Earlier when SLP attempted to see pt, he was with PT.  Note pt is negative for acute CVA.  Will continue efforts.    Kathleen Lime, MS [facility] SLP Acute Rehab Services Office [phone] Pager [phone]     Macario Golds 10/21/2020, 10:52 AM

## 2020-10-21 NOTE — Evaluation (Signed)
Occupational Therapy Evaluation Patient Details Name: Juan [name] MRN: [id number] DOB: 1935/10/28 Today's Date: 10/21/2020    History of Present Illness 85 y.o. male presenting to ED on 5/5 with AMS and gait disturbance resolving after 15 min. before resolving. Patient/family also note frequent falls x1 wk. CT  (-) for acute findings. Further work-up pending. PMHx significant for Hx of TIA 2013, CAD s/p CABG 2000, CKD, HLD, HTN and DM II.   Clinical Impression   PTA patient was living at [facility] in an independent living apartment and was completing ADLs/IADLs with and without use of SPC. Patient also reports driving, cooking, doing grocery shopping independently and being active with [place] therapies. Per patient report he's had 5 falls in the last 6 months. Patient currently functioning below baseline demonstrating decreased balance, decreased awareness of the extent of his balance deficits, decreased safety with AD presenting increased fall risk and need for Min guard to Min A grossly for safe completion of observed ADLs. Patient would benefit from continued acute OT services to maximize safety and independence with self-care tasks in prep for safe d/c to next level of care. Patient would prefer to return home with resumption of [place] services and assist from his daughters and son-in-law. Will need to confirm families ability to provide initial 24hr supervision/assist as patient is a questionable historian.     Follow Up Recommendations  Home health OT;Supervision/Assistance - 24 hour    Equipment Recommendations  None recommended by OT    Recommendations for Other Services       Precautions / Restrictions Precautions Precautions: Fall Precaution Comments: High fall risk Restrictions Weight Bearing Restrictions: No      Mobility Bed Mobility Overal bed mobility: Needs Assistance Bed Mobility: Supine to Sit;Sit to Supine     Supine to sit: Min guard Sit to supine: Supervision    General bed mobility comments: Min guard for supine to sit for safety. Return to supine with supervision A.    Transfers Overall transfer level: Needs assistance Equipment used: Straight cane;1 person hand held assist Transfers: Sit to/from Stand Sit to Stand: Min assist;Min guard         General transfer comment: Min A initially progressing to Min guard for sit to stand from EOB. Cues for hand placement and safety.    Balance Overall balance assessment: Needs assistance Sitting-balance support: No upper extremity supported;Feet supported Sitting balance-Leahy Scale: Good Sitting balance - Comments: Patient able to maintain sitting balance at EOB without external assist. Able to reach outside of BOS in sitting without LOB.   Standing balance support: Single extremity supported;During functional activity Standing balance-Leahy Scale: Poor Standing balance comment: Patient requires external assist to maintain dynamic standing balance. Does not use SPC properly increasing fall risk.                           ADL either performed or assessed with clinical judgement   ADL Overall ADL's : Needs assistance/impaired     Grooming: Min guard;Standing Grooming Details (indicate cue type and reason): 2/3 grooming tasks standing at sink with Min guard for safety/balance.             Lower Body Dressing: Minimal assistance;Sit to/from stand Lower Body Dressing Details (indicate cue type and reason): Min A for balance/safety. Toilet Transfer: Magazine features editor Details (indicate cue type and reason): Min guard for balance/safety. Toileting- Water quality scientist and Hygiene: Min Child psychotherapist  Details (indicate cue type and reason): Min guard for steading during toileting/hygiene/clothing management in standing.     Functional mobility during ADLs: Minimal assistance (Shuffling gait) General ADL Comments: Patient limited by decreased  awareness of deficits, decreased safety awareness, and need for Min guard to Min A grossly for safe completion of ADLs.     Vision Baseline Vision/History: Wears glasses Wears Glasses: At all times Patient Visual Report: No change from baseline Vision Assessment?: No apparent visual deficits     Perception     Praxis      Pertinent Vitals/Pain Pain Assessment: No/denies pain     Hand Dominance Right   Extremity/Trunk Assessment Upper Extremity Assessment Upper Extremity Assessment: Overall WFL for tasks assessed (5/5 and symmetrical bilaterally.)   Lower Extremity Assessment Lower Extremity Assessment: Defer to PT evaluation   Cervical / Trunk Assessment Cervical / Trunk Assessment: Kyphotic   Communication Communication Communication: Other (comment) (Difficult to understand. Has cochlear implant.)   Cognition Arousal/Alertness: Awake/alert Behavior During Therapy: WFL for tasks assessed/performed Overall Cognitive Status: Impaired/Different from baseline Area of Impairment: Safety/judgement;Problem solving                         Safety/Judgement: Decreased awareness of safety;Decreased awareness of deficits   Problem Solving: Slow processing General Comments: Patient able to answer home set-up quetions with some inconsistencies noted several times. Patient initially states that he no longer drives, then states that he does drive. Patient also initially states ability for 24hr supervision/assist post d/c but then states ability for only PRN supervision/assist. Patient able to follow 2-step verbal commands with good accuracy.   General Comments  Patient somewhat difficulty to understand. Has cochlear implant. Patient with poor safety awareness and poor awareness of balance deficits.    Exercises     Shoulder Instructions      Home Living Family/patient expects to be discharged to:: Other (Comment) ([place] (apartment)) Living Arrangements:  Alone Available Help at Discharge: Family;Available PRN/intermittently Type of Home: Apartment Home Access: Level entry     Home Layout: One level     Bathroom Shower/Tub: Occupational psychologist: Standard (BSC over toilet) Bathroom Accessibility: Yes   Home Equipment: Walker - standard;Cane - single point;Bedside commode;Shower seat - built in;Grab bars - tub/shower;Hand held shower head          Prior Functioning/Environment Level of Independence: Independent with assistive device(s)        Comments: Per patient report he was independent with ADLs/IADLs and drives. Paid assist with heavy cleaning every 3 weeks. Patient is a questionable historian.        OT Problem List: Impaired balance (sitting and/or standing);Decreased safety awareness;Decreased knowledge of use of DME or AE      OT Treatment/Interventions: Self-care/ADL training;Therapeutic exercise;Energy conservation;DME and/or AE instruction;Therapeutic activities;Patient/family education;Balance training    OT Goals(Current goals can be found in the care plan section) Acute Rehab OT Goals Patient Stated Goal: To return home. OT Goal Formulation: With patient Time For Goal Achievement: 11/04/20 Potential to Achieve Goals: Good ADL Goals Additional ADL Goal #1: Patient will complete ADL tranfsers with Mod I and safe use of AE. Additional ADL Goal #2: Patient will complete 3/3 grooming tasks standing at sink with Mod I and safe use of AD. Additional ADL Goal #3: Patient will recall 3 fall risk reducing strategies in prep for safe d/c to next level of care.  OT Frequency: Min 2X/week  Barriers to D/C: Decreased caregiver support  Lives alone       Co-evaluation              AM-PAC OT "6 Clicks" Daily Activity     Outcome Measure Help from another person eating meals?: None Help from another person taking care of personal grooming?: A Little Help from another person toileting, which  includes using toliet, bedpan, or urinal?: A Little Help from another person bathing (including washing, rinsing, drying)?: A Little Help from another person to put on and taking off regular upper body clothing?: A Little Help from another person to put on and taking off regular lower body clothing?: A Little 6 Click Score: 19   End of Session Equipment Utilized During Treatment: Gait belt;Other (comment) [facility]) Nurse Communication: Mobility status  Activity Tolerance: Patient tolerated treatment well Patient left: in bed;with call bell/phone within reach;with bed alarm set  OT Visit Diagnosis: Unsteadiness on feet (R26.81);Muscle weakness (generalized) (M62.81);Repeated falls (R29.6)                Time: 3474-2595 OT Time Calculation (min): 23 min Charges:  OT General Charges $OT Visit: 1 Visit OT Evaluation $OT Eval Moderate Complexity: 1 Mod OT Treatments $Self Care/Home Management : 8-22 mins  Jenin Birdsall H. OTR/L Supplemental OT, Department of rehab services [phone]  Javari Bufkin R H. 10/21/2020, 8:26 AM

## 2020-10-22 ENCOUNTER — Observation Stay (HOSPITAL_BASED_OUTPATIENT_CLINIC_OR_DEPARTMENT_OTHER): Payer: Federal, State, Local not specified - PPO

## 2020-10-22 DIAGNOSIS — G459 Transient cerebral ischemic attack, unspecified: Secondary | ICD-10-CM | POA: Diagnosis not present

## 2020-10-22 DIAGNOSIS — M79662 Pain in left lower leg: Secondary | ICD-10-CM

## 2020-10-22 DIAGNOSIS — I10 Essential (primary) hypertension: Secondary | ICD-10-CM | POA: Diagnosis not present

## 2020-10-22 LAB — BASIC METABOLIC PANEL
Anion gap: 6 (ref 5–15)
Anion gap: 7 (ref 5–15)
BUN: 31 mg/dL — ABNORMAL HIGH (ref 8–23)
BUN: 24 mg/dL — ABNORMAL HIGH (ref 8–23)
CO2: 24 mmol/L (ref 22–32)
CO2: 24 mmol/L (ref 22–32)
Calcium: 9.7 mg/dL (ref 8.9–10.3)
Calcium: 10 mg/dL (ref 8.9–10.3)
Chloride: 110 mmol/L (ref 98–111)
Chloride: 107 mmol/L (ref 98–111)
Creatinine, Ser: 1.73 mg/dL — ABNORMAL HIGH (ref 0.61–1.24)
Creatinine, Ser: 1.3 mg/dL — ABNORMAL HIGH (ref 0.61–1.24)
GFR, Estimated: 39 mL/min — ABNORMAL LOW (ref >60)
GFR, Estimated: 55 mL/min — ABNORMAL LOW (ref >60)
Glucose, Bld: 110 mg/dL — ABNORMAL HIGH (ref 70–99)
Glucose, Bld: 121 mg/dL — ABNORMAL HIGH (ref 70–99)
Potassium: 3.6 mmol/L (ref 3.5–5.1)
Potassium: 3.6 mmol/L (ref 3.5–5.1)
Sodium: 140 mmol/L (ref 135–145)
Sodium: 138 mmol/L (ref 135–145)

## 2020-10-22 LAB — GLUCOSE, CAPILLARY
Glucose-Capillary: 118 mg/dL — ABNORMAL HIGH (ref 70–99)
Glucose-Capillary: 131 mg/dL — ABNORMAL HIGH (ref 70–99)
Glucose-Capillary: 187 mg/dL — ABNORMAL HIGH (ref 70–99)
Glucose-Capillary: 139 mg/dL — ABNORMAL HIGH (ref 70–99)

## 2020-10-22 NOTE — Progress Notes (Addendum)
Progress Note    Juan [name]  [id number] DOB: 08/01/1935  DOA: 10/20/2020 PCP: Binnie Rail, MD    Brief Narrative:     Medical records reviewed and are as summarized below:  Juan [name] is an 85 y.o. male with medical history significant of coronary artery disease, gout, hyperlipidemia, hypertension, diabetes who presents to the hospital after being found confused and with a shuffling gait at the grocery store this afternoon.  His daughter at bedside is able to add to the history.  She states that over the past couple of weeks, patient has had increasing frequency of falls at home.  Patient resides at a [facility] independent living facility.  Work up negative for CVA-- needs SNF placement.  Assessment/Plan:   Principal Problem:   TIA (transient ischemic attack) Active Problems:   History of gout   Essential hypertension, benign   CKD (chronic kidney disease)   Shuffling gait   Diabetes mellitus without complication (HCC)   Recurrent falls   Possible TIA -CT head stable without acute intracranial abnormality -Unable to obtain MRI due to cochlear implant -CTA: No intracranial arterial occlusion or high-grade stenosis. 2. 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 -Neurology consulted -Continue aspirin, Pravachol -Echo: No intracardiac source of embolism  detected on this transthoracic study. A transesophageal echocardiogram is  recommended to exclude cardiac source of embolism if clinically indicated.   CKD stage IIIa -Baseline creatinine around 1.3 -stable  Diabetes mellitus type 2, well controlled -Hemoglobin A1c in March 2022 was 6.7 -Hold metformin -Sliding scale insulin while inpatient  Hypertension -Hold Norvasc, Lopressor in setting of neurologic event  Gout -Continue allopurinol  Duplex negative for DVT  Family Communication/Anticipated D/C date and plan/Code Status   DVT prophylaxis: Lovenox ordered. Code Status: Full  Code.  Family Communication: at bedside Disposition Plan: Status is: Observation    Dispo: The patient is from: Home              Anticipated d/c is to: rehab              Patient currently is not medically stable to d/c.   Difficult to place patient No         Medical Consultants:    neurology     Subjective:   Not excited about going to rehab  Objective:    Vitals:   10/21/20 2015 10/21/20 2350 10/22/20 0349 10/22/20 1230  BP: 140/79 130/65 (!) 151/88 139/83  Pulse: 79 63 70 87  Resp: 18 17 19 16   Temp: 98.5 F (36.9 C) 98.9 F (37.2 C) 98.1 F (36.7 C) 98.4 F (36.9 C)  TempSrc: Oral Oral Oral Oral  SpO2: 96% 99% 95% 94%    Intake/Output Summary (Last 24 hours) at 10/22/2020 1410 Last data filed at 10/22/2020 0600 Gross per 24 hour  Intake 480 ml  Output --  Net 480 ml   There were no vitals filed for this visit.  Exam:  General: Appearance:     Overweight male in no acute distress     Lungs:      respirations unlabored  Heart:    Normal heart rate.  MS:   All extremities are intact.   Neurologic:   Hard of hearing     Data Reviewed:   I have personally reviewed following labs and imaging studies:  Labs: Labs show the following:   Basic Metabolic Panel: Recent Labs  Lab 10/20/20 1315 10/21/20 0259 10/22/20 0021  10/22/20 1222  NA 139 140 140 138  K 4.2 3.5 3.6 3.6  CL 109 108 110 107  CO2 21* 25 24 24   GLUCOSE 137* 121* 110* 121*  BUN 34* 28* 31* 24*  CREATININE 1.40* 1.46* 1.73* 1.30*  CALCIUM 10.2 10.0 9.7 10.0   GFR Estimated Creatinine Clearance: 42.4 mL/min (A) (by C-G formula based on SCr of 1.3 mg/dL (H)). Liver Function Tests: Recent Labs  Lab 10/20/20 1315  AST 17  ALT 14  ALKPHOS 32*  BILITOT 0.4  PROT 6.3*  ALBUMIN 3.7   No results for input(s): LIPASE, AMYLASE in the last 168 hours. No results for input(s): AMMONIA in the last 168 hours. Coagulation profile No results for input(s): INR, PROTIME in the  last 168 hours.  CBC: Recent Labs  Lab 10/20/20 1315  WBC 6.3  NEUTROABS 3.9  HGB 12.5*  HCT 39.1  MCV 92.7  PLT 265   Cardiac Enzymes: No results for input(s): CKTOTAL, CKMB, CKMBINDEX, TROPONINI in the last 168 hours. BNP (last 3 results) No results for input(s): PROBNP in the last 8760 hours. CBG: Recent Labs  Lab 10/21/20 1058 10/21/20 1603 10/21/20 2112 10/22/20 0603 10/22/20 1101  GLUCAP 135* 114* 127* 118* 131*   D-Dimer: No results for input(s): DDIMER in the last 72 hours. Hgb A1c: Recent Labs    10/21/20 0259  HGBA1C 7.1*   Lipid Profile: Recent Labs    10/21/20 0259  CHOL 135  HDL 31*  LDLCALC 75  TRIG 145  CHOLHDL 4.4   Thyroid function studies: No results for input(s): TSH, T4TOTAL, T3FREE, THYROIDAB in the last 72 hours.  Invalid input(s): FREET3 Anemia work up: No results for input(s): VITAMINB12, FOLATE, FERRITIN, TIBC, IRON, RETICCTPCT in the last 72 hours. Sepsis Labs: Recent Labs  Lab 10/20/20 1315  WBC 6.3    Microbiology Recent Results (from the past 240 hour(s))  SARS CORONAVIRUS 2 (TAT 6-24 HRS) Nasopharyngeal Nasopharyngeal Swab     Status: None   Collection Time: 10/20/20  5:06 PM   Specimen: Nasopharyngeal Swab  Result Value Ref Range Status   SARS Coronavirus 2 NEGATIVE NEGATIVE Final    Comment: (NOTE) SARS-CoV-2 target nucleic acids are NOT DETECTED.  The SARS-CoV-2 RNA is generally detectable in upper and lower respiratory specimens during the acute phase of infection. Negative results do not preclude SARS-CoV-2 infection, do not rule out co-infections with other pathogens, and should not be used as the sole basis for treatment or other patient management decisions. Negative results must be combined with clinical observations, patient history, and epidemiological information. The expected result is Negative.  Fact Sheet for Patients: SugarRoll.be  Fact Sheet for Healthcare  Providers: https://www.woods-mathews.com/  This test is not yet approved or cleared by the [place] FDA and  has been authorized for detection and/or diagnosis of SARS-CoV-2 by FDA under an Emergency Use Authorization (EUA). This EUA will remain  in effect (meaning this test can be used) for the duration of the COVID-19 declaration under Se ction 564(b)(1) of the Act, 21 U.S.C. section 360bbb-3(b)(1), unless the authorization is terminated or revoked sooner.  Performed at [facility], [place] [address], [place], [place] [address]     Procedures and diagnostic studies:  CT ANGIO HEAD NECK W WO CM  Result Date: 10/21/2020 CLINICAL DATA:  Stroke/TIA, assess intracranial arteries EXAM: CT ANGIOGRAPHY HEAD AND NECK TECHNIQUE: Multidetector CT imaging of the head and neck was performed using the standard protocol during bolus administration of  intravenous contrast. Multiplanar CT image reconstructions and MIPs were obtained to evaluate the vascular anatomy. Carotid stenosis measurements (when applicable) are obtained utilizing NASCET criteria, using the distal internal carotid diameter as the denominator. CONTRAST:  70mL OMNIPAQUE IOHEXOL 350 MG/ML SOLN COMPARISON:  None. FINDINGS: CT HEAD FINDINGS Brain: There is no mass, hemorrhage or extra-axial collection. The size and configuration of the ventricles and extra-axial CSF spaces are normal. There is an old right basal ganglia lacunar infarct. Bone anchored hearing aid causes extensive streak artifact. The brain parenchyma is normal. Skull: The visualized skull base, calvarium and extracranial soft tissues are normal. Sinuses/Orbits: No fluid levels or advanced mucosal thickening of the visualized paranasal sinuses. No mastoid or middle ear effusion. The orbits are normal. CTA NECK FINDINGS SKELETON: There is no bony spinal canal stenosis. No lytic or blastic lesion. OTHER NECK: Normal pharynx, larynx and major salivary glands. No  cervical lymphadenopathy. Unremarkable thyroid gland. UPPER CHEST: No pneumothorax or pleural effusion. No nodules or masses. AORTIC ARCH: There is calcific atherosclerosis of the aortic arch. There is no aneurysm, dissection or hemodynamically significant stenosis of the visualized portion of the aorta. Conventional 3 vessel aortic branching pattern. The visualized proximal subclavian arteries are widely patent. RIGHT CAROTID SYSTEM: Normal without aneurysm, dissection or stenosis. LEFT CAROTID SYSTEM: Normal without aneurysm, dissection or stenosis. VERTEBRAL ARTERIES: Codominant configuration. Severe stenosis of the right vertebral artery origin (12:161, 162). There is no dissection, occlusion or flow-limiting stenosis to the skull base (V1-V3 segments). CTA HEAD FINDINGS POSTERIOR CIRCULATION: --Vertebral arteries: Normal V4 segments. --Inferior cerebellar arteries: Normal. --Basilar artery: Normal. --Superior cerebellar arteries: Normal. --Posterior cerebral arteries (PCA): Mild narrowing of the right P2 segment. ANTERIOR CIRCULATION: --Intracranial internal carotid arteries: Normal. --Anterior cerebral arteries (ACA): Normal. Both A1 segments are present. Patent anterior communicating artery (a-comm). --Middle cerebral arteries (MCA): Normal. VENOUS SINUSES: As permitted by contrast timing, patent. ANATOMIC VARIANTS: None Review of the MIP images confirms the above findings. IMPRESSION: 1. No intracranial arterial occlusion or high-grade stenosis. 2. 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 Aortic Atherosclerosis (ICD10-I70.0). Electronically Signed   By: Ulyses Jarred M.D.   On: 10/21/2020 20:13   ECHOCARDIOGRAM COMPLETE  Result Date: 10/21/2020    ECHOCARDIOGRAM REPORT   Patient Name:   SHAUNT GUNTRUM Date of Exam: 10/21/2020 Medical Rec #:  [id number]    Height:       68.0 in Accession #:    [id number]   Weight:       174.8 lb Date of Birth:  [birth year]    BSA:          1.930 m Patient Age:    87  years     BP:           137/84 mmHg Patient Gender: M            HR:           67 bpm. Exam Location:  Inpatient Procedure: 2D Echo, Color Doppler and Cardiac Doppler Indications:    TIA G45.9  History:        Patient has prior history of Echocardiogram examinations, most                 recent 06/19/2013. CAD, TIA, Signs/Symptoms:Syncope; Risk                 Factors:Diabetes, Hypertension and Dyslipidemia.  Sonographer:    Bernadene Person RDCS Referring Phys: [id number] [place]  1. Left ventricular ejection fraction,  by estimation, is 60 to 65%. The left ventricle has normal function. The left ventricle has no regional wall motion abnormalities. Left ventricular diastolic parameters were normal.  2. Right ventricular systolic function is normal. The right ventricular size is normal.  3. The IAS appears hypermobile. Borderline aneurysmal. No definitive PFO.  4. The mitral valve is grossly normal. Trivial mitral valve regurgitation. No evidence of mitral stenosis.  5. The aortic valve is tricuspid. There is mild calcification of the aortic valve. There is mild thickening of the aortic valve. Aortic valve regurgitation is not visualized. Mild aortic valve sclerosis is present, with no evidence of aortic valve stenosis.  6. The inferior vena cava is normal in size with greater than 50% respiratory variability, suggesting right atrial pressure of 3 mmHg. Conclusion(s)/Recommendation(s): No intracardiac source of embolism detected on this transthoracic study. A transesophageal echocardiogram is recommended to exclude cardiac source of embolism if clinically indicated. FINDINGS  Left Ventricle: Left ventricular ejection fraction, by estimation, is 60 to 65%. The left ventricle has normal function. The left ventricle has no regional wall motion abnormalities. The left ventricular internal cavity size was normal in size. There is  no left ventricular hypertrophy. Left ventricular diastolic parameters were  normal. Right Ventricle: The right ventricular size is normal. No increase in right ventricular wall thickness. Right ventricular systolic function is normal. Left Atrium: Left atrial size was normal in size. Right Atrium: Right atrial size was normal in size. Pericardium: Trivial pericardial effusion is present. Presence of pericardial fat pad. Mitral Valve: The mitral valve is grossly normal. Trivial mitral valve regurgitation. No evidence of mitral valve stenosis. Tricuspid Valve: The tricuspid valve is grossly normal. Tricuspid valve regurgitation is trivial. No evidence of tricuspid stenosis. Aortic Valve: The aortic valve is tricuspid. There is mild calcification of the aortic valve. There is mild thickening of the aortic valve. Aortic valve regurgitation is not visualized. Mild aortic valve sclerosis is present, with no evidence of aortic valve stenosis. Pulmonic Valve: The pulmonic valve was grossly normal. Pulmonic valve regurgitation is not visualized. No evidence of pulmonic stenosis. Aorta: The aortic root and ascending aorta are structurally normal, with no evidence of dilitation. Venous: The inferior vena cava is normal in size with greater than 50% respiratory variability, suggesting right atrial pressure of 3 mmHg. IAS/Shunts: There is redundancy of the interatrial septum. The atrial septum is grossly normal.  LEFT VENTRICLE PLAX 2D LVIDd:         4.50 cm  Diastology LVIDs:         3.00 cm  LV e' medial:    8.27 cm/s LV PW:         1.00 cm  LV E/e' medial:  6.0 LV IVS:        1.00 cm  LV e' lateral:   9.32 cm/s LVOT diam:     2.20 cm  LV E/e' lateral: 5.4 LV SV:         80 LV SV Index:   42 LVOT Area:     3.80 cm  RIGHT VENTRICLE RV S prime:     12.50 cm/s TAPSE (M-mode): 1.9 cm LEFT ATRIUM             Index       RIGHT ATRIUM           Index LA diam:        3.80 cm 1.97 cm/m  RA Area:     13.70 cm LA Vol (A2C):  34.3 ml 17.77 ml/m RA Volume:   27.50 ml  14.25 ml/m LA Vol (A4C):   24.0 ml 12.43  ml/m LA Biplane Vol: 30.9 ml 16.01 ml/m  AORTIC VALVE LVOT Vmax:   124.00 cm/s LVOT Vmean:  80.700 cm/s LVOT VTI:    0.211 m  AORTA Ao Root diam: 3.50 cm Ao Asc diam:  3.90 cm MITRAL VALVE MV Area (PHT): 2.05 cm    SHUNTS MV Decel Time: 370 msec    Systemic VTI:  0.21 m MV E velocity: 49.90 cm/s  Systemic Diam: 2.20 cm MV A velocity: 70.60 cm/s MV E/A ratio:  0.71 Eleonore Chiquito MD Electronically signed by Eleonore Chiquito MD Signature Date/Time: 10/21/2020/2:50:55 PM    Final    VAS [place] LOWER EXTREMITY VENOUS (DVT)  Result Date: 10/22/2020  Lower Venous DVT Study Patient Name:  THORSTEN CLIMER  Date of Exam:   10/22/2020 Medical Rec #: [id number]     Accession #:    [id number] Date of Birth: [birth year]     Patient Gender: M Patient Age:   84Y Exam Location:  [facility] Procedure:      VAS [place] LOWER EXTREMITY VENOUS (DVT) Referring Phys: [id number] Cornelius Moras XU --------------------------------------------------------------------------------  Indications: Left lower extremity pain.  Comparison Study: No prior study Performing Technologist: Maudry Mayhew MHA, RDMS, RVT, RDCS  Examination Guidelines: A complete evaluation includes B-mode imaging, spectral Doppler, color Doppler, and power Doppler as needed of all accessible portions of each vessel. Bilateral testing is considered an integral part of a complete examination. Limited examinations for reoccurring indications may be performed as noted. The reflux portion of the exam is performed with the patient in reverse Trendelenburg.  +-----+---------------+---------+-----------+----------+--------------+ RIGHTCompressibilityPhasicitySpontaneityPropertiesThrombus Aging +-----+---------------+---------+-----------+----------+--------------+ CFV  Full           Yes      Yes                                 +-----+---------------+---------+-----------+----------+--------------+   +---------+---------------+---------+-----------+----------+--------------+  LEFT     CompressibilityPhasicitySpontaneityPropertiesThrombus Aging +---------+---------------+---------+-----------+----------+--------------+ CFV      Full           Yes      Yes                                 +---------+---------------+---------+-----------+----------+--------------+ SFJ      Full                                                        +---------+---------------+---------+-----------+----------+--------------+ FV Prox  Full                                                        +---------+---------------+---------+-----------+----------+--------------+ FV Mid   Full                                                        +---------+---------------+---------+-----------+----------+--------------+  FV DistalFull                                                        +---------+---------------+---------+-----------+----------+--------------+ PFV      Full                                                        +---------+---------------+---------+-----------+----------+--------------+ POP      Full           Yes      Yes                                 +---------+---------------+---------+-----------+----------+--------------+ PTV      Full                                                        +---------+---------------+---------+-----------+----------+--------------+ PERO     Full                                                        +---------+---------------+---------+-----------+----------+--------------+     Summary: RIGHT: - No evidence of common femoral vein obstruction.  LEFT: - There is no evidence of deep vein thrombosis in the lower extremity.  - No cystic structure found in the popliteal fossa.  *See table(s) above for measurements and observations.    Preliminary     Medications:   . allopurinol  100 mg Oral Daily  . aspirin EC  81 mg Oral Daily  . clopidogrel  75 mg Oral Daily  . finasteride  5 mg Oral Daily   . heparin  5,000 Units Subcutaneous Q8H  . insulin aspart  0-9 Units Subcutaneous TID WC  . pravastatin  20 mg Oral Once per day on Mon Wed Fri   Continuous Infusions:    LOS: 0 days   Geradine Girt  [facility]   How to contact the [facility] Attending or Consulting provider [place] or covering provider during after hours [place], for this patient?  1. Check the care team in [facility] and look for a) attending/consulting [facility] provider listed and b) the [facility] team listed 2. Log into [email or web address] and use [facility]'s universal password to access. If you do not have the password, please contact the hospital operator. 3. Locate the [facility] provider you are looking for under [facility] and page to a number that you can be directly reached. 4. If you still have difficulty reaching the provider, please page the [facility] (Director on Call) for the Hospitalists listed on amion for assistance.  10/22/2020, 2:10 PM

## 2020-10-22 NOTE — Progress Notes (Addendum)
STROKE TEAM PROGRESS NOTE  Juan [name]: 85 year old male presenting with transient LLE weakness limiting ambulation, in conjunction with decreased movement of LUE and slurred speech noted by staff at [facility] where he was shopping at the time. He presented to the ED for this episode.  INTERVAL HISTORY No acute events  His daughter is at the bedside.   He has a cochlear implant and is dysarthric at baseline per daughter.  He is a lip reader. Communication is impaired by his hearing loss.  Today he is feeling much better. He reports continued LLE pain, reports some intermittent back pain as well. Daughter relates that there has been 5 falls in the past 3 weeks. She reports the [facility] staff told her he was trying to pay with his keys and seemed confused. Patient describes these falls as legs just giving out on him. He denies any pre-syncopal symptoms such as vision changes, lightheadedness, dizziness, chest pain, shortness of breath or palpitations. He also does not recall feeling confused or having trouble with speech.  He endorses intermittent back pain ongoing left knee pain as well. Knee Xray obtained at last fall 4/28 was negative. He live in an assisted living type setting but is independent. With the recent falls she worries the setting is not adequate.  Privately in the hallway daughter shares that she has had to take over appointments and financial issues over the past six months as he has declined. 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 No formal diagnosis of dementia or medications on board. We discussed his ongoing stroke work up and plan of care. Questions were addressed.   Vitals:   10/21/20 2015 10/21/20 2350 10/22/20 0349 10/22/20 1230  BP: 140/79 130/65 (!) 151/88 139/83  Pulse: 79 63 70 87  Resp: 18 17 19 16   Temp: 98.5 F (36.9 C) 98.9 F (37.2 C) 98.1 F (36.7 C) 98.4 F (36.9 C)   TempSrc: Oral Oral Oral Oral  SpO2: 96% 99% 95% 94%   CBC:  Recent Labs  Lab 10/20/20 1315  WBC 6.3  NEUTROABS 3.9  HGB 12.5*  HCT 39.1  MCV 92.7  PLT 017   Basic Metabolic Panel:  Recent Labs  Lab 10/22/20 0021 10/22/20 1222  NA 140 138  K 3.6 3.6  CL 110 107  CO2 24 24  GLUCOSE 110* 121*  BUN 31* 24*  CREATININE 1.73* 1.30*  CALCIUM 9.7 10.0   Lipid Panel:  Recent Labs  Lab 10/21/20 0259  CHOL 135  TRIG 145  HDL 31*  CHOLHDL 4.4  VLDL 29  LDLCALC 75   HgbA1c:  Recent Labs  Lab 10/21/20 0259  HGBA1C 7.1*   Urine Drug Screen: No results for input(s): LABOPIA, COCAINSCRNUR, LABBENZ, AMPHETMU, THCU, LABBARB in the last 168 hours.  Alcohol Level No results for input(s): ETH in the last 168 hours.  IMAGING past 24 hours CT ANGIO HEAD NECK W WO CM  Result Date: 10/21/2020 CLINICAL DATA:  Stroke/TIA, assess intracranial arteries EXAM: CT ANGIOGRAPHY HEAD AND NECK TECHNIQUE: Multidetector CT imaging of the head and neck was performed using the standard protocol during bolus administration of intravenous contrast. Multiplanar CT image reconstructions and MIPs were obtained to evaluate the vascular anatomy. Carotid stenosis measurements (when applicable) are obtained utilizing NASCET criteria, using the distal internal carotid diameter as the denominator. CONTRAST:  80mL OMNIPAQUE IOHEXOL 350 MG/ML SOLN COMPARISON:  None.  FINDINGS: CT HEAD FINDINGS Brain: There is no mass, hemorrhage or extra-axial collection. The size and configuration of the ventricles and extra-axial CSF spaces are normal. There is an old right basal ganglia lacunar infarct. Bone anchored hearing aid causes extensive streak artifact. The brain parenchyma is normal. Skull: The visualized skull base, calvarium and extracranial soft tissues are normal. Sinuses/Orbits: No fluid levels or advanced mucosal thickening of the visualized paranasal sinuses. No mastoid or middle ear effusion. The orbits are normal.  CTA NECK FINDINGS SKELETON: There is no bony spinal canal stenosis. No lytic or blastic lesion. OTHER NECK: Normal pharynx, larynx and major salivary glands. No cervical lymphadenopathy. Unremarkable thyroid gland. UPPER CHEST: No pneumothorax or pleural effusion. No nodules or masses. AORTIC ARCH: There is calcific atherosclerosis of the aortic arch. There is no aneurysm, dissection or hemodynamically significant stenosis of the visualized portion of the aorta. Conventional 3 vessel aortic branching pattern. The visualized proximal subclavian arteries are widely patent. RIGHT CAROTID SYSTEM: Normal without aneurysm, dissection or stenosis. LEFT CAROTID SYSTEM: Normal without aneurysm, dissection or stenosis. VERTEBRAL ARTERIES: Codominant configuration. Severe stenosis of the right vertebral artery origin (12:161, 162). There is no dissection, occlusion or flow-limiting stenosis to the skull base (V1-V3 segments). CTA HEAD FINDINGS POSTERIOR CIRCULATION: --Vertebral arteries: Normal V4 segments. --Inferior cerebellar arteries: Normal. --Basilar artery: Normal. --Superior cerebellar arteries: Normal. --Posterior cerebral arteries (PCA): Mild narrowing of the right P2 segment. ANTERIOR CIRCULATION: --Intracranial internal carotid arteries: Normal. --Anterior cerebral arteries (ACA): Normal. Both A1 segments are present. Patent anterior communicating artery (a-comm). --Middle cerebral arteries (MCA): Normal. VENOUS SINUSES: As permitted by contrast timing, patent. ANATOMIC VARIANTS: None Review of the MIP images confirms the above findings. IMPRESSION: 1. No intracranial arterial occlusion or high-grade stenosis. 2. 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 Aortic Atherosclerosis (ICD10-I70.0). Electronically Signed   By: Ulyses Jarred M.D.   On: 10/21/2020 20:13   VAS [place] LOWER EXTREMITY VENOUS (DVT)  Result Date: 10/22/2020  Lower Venous DVT Study Patient Name:  Juan [name]  Date of Exam:   10/22/2020  Medical Rec #: [id number]     Accession #:    [id number] Date of Birth: [birth year]     Patient Gender: M Patient Age:   84Y Exam Location:  [facility] Procedure:      VAS [place] LOWER EXTREMITY VENOUS (DVT) Referring Phys: [id number] Cornelius Moras XU --------------------------------------------------------------------------------  Indications: Left lower extremity pain.  Comparison Study: No prior study Performing Technologist: Maudry Mayhew MHA, RDMS, RVT, RDCS  Examination Guidelines: A complete evaluation includes B-mode imaging, spectral Doppler, color Doppler, and power Doppler as needed of all accessible portions of each vessel. Bilateral testing is considered an integral part of a complete examination. Limited examinations for reoccurring indications may be performed as noted. The reflux portion of the exam is performed with the patient in reverse Trendelenburg.  +-----+---------------+---------+-----------+----------+--------------+ RIGHTCompressibilityPhasicitySpontaneityPropertiesThrombus Aging +-----+---------------+---------+-----------+----------+--------------+ CFV  Full           Yes      Yes                                 +-----+---------------+---------+-----------+----------+--------------+   +---------+---------------+---------+-----------+----------+--------------+ LEFT     CompressibilityPhasicitySpontaneityPropertiesThrombus Aging +---------+---------------+---------+-----------+----------+--------------+ CFV      Full           Yes      Yes                                 +---------+---------------+---------+-----------+----------+--------------+  SFJ      Full                                                        +---------+---------------+---------+-----------+----------+--------------+ FV Prox  Full                                                        +---------+---------------+---------+-----------+----------+--------------+ FV Mid   Full                                                         +---------+---------------+---------+-----------+----------+--------------+ FV DistalFull                                                        +---------+---------------+---------+-----------+----------+--------------+ PFV      Full                                                        +---------+---------------+---------+-----------+----------+--------------+ POP      Full           Yes      Yes                                 +---------+---------------+---------+-----------+----------+--------------+ PTV      Full                                                        +---------+---------------+---------+-----------+----------+--------------+ PERO     Full                                                        +---------+---------------+---------+-----------+----------+--------------+     Summary: RIGHT: - No evidence of common femoral vein obstruction.  LEFT: - There is no evidence of deep vein thrombosis in the lower extremity.  - No cystic structure found in the popliteal fossa.  *See table(s) above for measurements and observations.    Preliminary    PHYSICAL EXAM: pt seen with two daughters at bedside.  Mental Status: Awake and alert. Primarily reads lips when communicating. Speech is dysarthric (baseline). Oriented x 5. Speech is fluent. Names 3/3.  Repeat 5 word sentence. Follows 2 step command but not 3 step. Comprehension intact.  Cranial Nerves: II: VFF.   PERRL  III,IV, VI:  No ptosis. EOMI with saccadic quality of pursuits noted.  V,VII: Smile symmetric, facial temp sensation equal bilaterally VIII: Severe hearing loss IX,X: Palate rises symmetrically XI: Symmetric shoulder shrug XII: Midline tongue extension Motor: RUE and LUE 5/5 proximally and distally. No pronator drift.  RLE 5/5 proximally and distally LLE 5/5 proximally and distally No asymmetry No rest or action tremor Sensory: Temp and  light touch intact to BUE and BLE. No extinction to DSS.  Plantars: Right: downgoing                           Left: downgoing Cerebellar: No ataxia with FNF bilaterally  Gait: Deferred   ASSESSMENT/PLAN 85 year old male with PMH of DM2, HTN, HLD, former smoker, hearing loss with cochlear implant now presenting with stroke like episode/TIA consisting of transient LLE weakness limiting ambulation, in conjunction with decreased movement of LUE and slurred speech Stroke work up ongoing but limited by inability to obtain MRI due to cochlear implant.    Code Stroke CT head: No acute abnormality.   CTA head & neck:  Done 10/21/20 shows no high grade stenosis or occlusion but does reeal severe stenosis of the right vertebral artery origin.   MRI: unable to obtain due to cochlear implant  2D Echo EF 60-65%, The IAS appears hypermobile. Borderline aneurysmal. No definitive PFO. No thrombus, wall motion abnormality.     LDL 75  HgbA1c 7.1  VTE prophylaxis - heparin sq on board     Diet   Diet Carb Modified Fluid consistency: Thin; Room service appropriate? Yes   ASA 81mg  prior to admission.  recommend aspirin 81, Plavix 75 DAPT for 3 weeks and then aspirin alone after.  Continue pravastatin.    Therapy recommendations:   Home health PT.   Disposition:  TBD  Hypertension  Home meds:  Lopressor, norvasc . Long-term BP goal normotensive  Hyperlipidemia  Home meds:  Pravachol 20mg    LDL 75, goal < 70  High intensity statin: not clear if indicated at this time  Continue statin at discharge  Back pain Leg weakness  5 falls in past 3 weeks  Intermittent lumbar pain   Gait disturbance   ultrasound of BLE done today shows no DVT.    Cognitive decline  Has lost ability to manage own appointments and finances in past 6 months  Daughter reports slow, steady decline  Neurology/neuropsychology follow up as outpatient.   Diabetes type II Uncontrolled  HgbA1c 7.1, goal <  7.0  CBGs Recent Labs    10/21/20 2112 10/22/20 0603 10/22/20 1101  GLUCAP 127* 118* 131*      Management Per primary team   Other Stroke Risk Factors  Advanced Age >/= 20   Former Cigar smoker  Current ETOH use, alcohol level. advised to drink no more than 2 drink(s) a day  Other Active Problems  Hospital day # [place], PA-C     To contact Stroke Continuity provider, please refer to [email or web address] After hours, contact General Neurology

## 2020-10-22 NOTE — Progress Notes (Signed)
Left lower extremity venous duplex completed. Refer to "CV Proc" under chart review to view preliminary results.  10/22/2020 11:38 AM Kelby Aline., MHA, RVT, RDCS, RDMS

## 2020-10-22 NOTE — Progress Notes (Signed)
Physical Therapy Treatment Patient Details Name: Juan [name] MRN: [id number] DOB: 09-19-1935 Today's Date: 10/22/2020    History of Present Illness 85 y.o. male presenting to ED on 5/5 with AMS and gait disturbance resolving after 15 min. before resolving. Patient/family also note frequent falls x1 wk. CT  (-) for acute findings. Further work-up pending. PMHx significant for Hx of TIA 2013, CAD s/p CABG 2000, CKD, HLD, HTN and DM II.    PT Comments    Pt was reassessed today with notable incoordination on LLE that was better with use of RW.  Unsafe with gait on SPC, and pt appears to be somewhat likely to furniture walk at home.  Due to his mult falls at home and relative struggle to properly use AD's, would recommend him to go briefly to rehab to work on coordination and timing of mobility.  Pt is reluctant to commit to this and may decline entirely.  Continue to work on focused balance tasks in case pt elects to go directly home.   Follow Up Recommendations  SNF     Equipment Recommendations  None recommended by PT    Recommendations for Other Services       Precautions / Restrictions Precautions Precautions: Fall Precaution Comments: High fall risk Restrictions Weight Bearing Restrictions: No Other Position/Activity Restrictions: requires RW due to balance skills    Mobility  Bed Mobility Overal bed mobility: Needs Assistance Bed Mobility: Supine to Sit;Sit to Supine     Supine to sit: Min guard Sit to supine: Min guard   General bed mobility comments: cues for safety and sequence    Transfers Overall transfer level: Needs assistance Equipment used: Straight cane;Rolling walker (2 wheeled) Transfers: Sit to/from Stand Sit to Stand: Mod assist         General transfer comment: mod to power up  Ambulation/Gait Ambulation/Gait assistance: Min guard;Min assist Gait Distance (Feet): 200 Feet (100 x2 using different AD's) Assistive device: Rolling walker (2  wheeled);Straight cane Gait Pattern/deviations: Step-to pattern;Wide base of support Gait velocity: rapid pace   General Gait Details:  (pt is clearing his feet but step/stride length is quite short.  Rapid pace with awkward quality)   Stairs             Wheelchair Mobility    Modified Rankin (Stroke Patients Only)       Balance Overall balance assessment: Needs assistance Sitting-balance support: Feet supported Sitting balance-Leahy Scale: Good     Standing balance support: Bilateral upper extremity supported;Single extremity supported;During functional activity Standing balance-Leahy Scale: Fair Standing balance comment: dynamic balance tasks of narrow based stance, SLstance and tandem stance require min to mod assist to maintain balance                            Cognition Arousal/Alertness: Awake/alert Behavior During Therapy: WFL for tasks assessed/performed;Impulsive (impulsive at times) Overall Cognitive Status: Impaired/Different from baseline Area of Impairment: Problem solving;Awareness;Safety/judgement;Following commands;Memory;Attention;Orientation                 Orientation Level: Situation Current Attention Level: Selective Memory: Decreased short-term memory;Decreased recall of precautions Following Commands: Follows one step commands inconsistently;Follows one step commands with increased time Safety/Judgement: Decreased awareness of safety;Decreased awareness of deficits Awareness: Intellectual;Emergent Problem Solving: Slow processing;Requires verbal cues;Requires tactile cues General Comments: inconsistent information but also unrealistic about his ability to safely balance      Exercises Other Exercises Other Exercises: coordination on LLE  is moderately impaired    General Comments General comments (skin integrity, edema, etc.): Pt was hands on support for three of the Berg skills, dropped down to walker requirement based on  test protocol.  Pt is able to walk in a more controlled way on walker but tends to speed up and avoid support on walker      Pertinent Vitals/Pain Pain Assessment: No/denies pain    Home Living                      Prior Function            PT Goals (current goals can now be found in the care plan section) Acute Rehab PT Goals Patient Stated Goal: return to ILF Progress towards PT goals: Progressing toward goals    Frequency    Min 3X/week      PT Plan Discharge plan needs to be updated    Co-evaluation              AM-PAC PT "6 Clicks" Mobility   Outcome Measure  Help needed turning from your back to your side while in a flat bed without using bedrails?: None Help needed moving from lying on your back to sitting on the side of a flat bed without using bedrails?: A Little Help needed moving to and from a bed to a chair (including a wheelchair)?: A Little Help needed standing up from a chair using your arms (e.g., wheelchair or bedside chair)?: A Lot Help needed to walk in hospital room?: A Little Help needed climbing 3-5 steps with a railing? : A Lot 6 Click Score: 17    End of Session Equipment Utilized During Treatment: Gait belt Activity Tolerance: Patient tolerated treatment well;Treatment limited secondary to medical complications (Comment) (coordination and timing on LLE are an issue) Patient left: in bed;with call bell/phone within reach;with bed alarm set Nurse Communication: Mobility status PT Visit Diagnosis: History of falling (Z91.81);Difficulty in walking, not elsewhere classified (R26.2)     Time: 1239-1310 PT Time Calculation (min) (ACUTE ONLY): 31 min  Charges:  $Gait Training: 8-22 mins $Neuromuscular Re-education: 8-22 mins                 Ramond Dial 10/22/2020, 2:55 PM Mee Hives, PT MS Acute Rehab Dept. Number: [place] [facility] [place]

## 2020-10-23 DIAGNOSIS — I10 Essential (primary) hypertension: Secondary | ICD-10-CM | POA: Diagnosis not present

## 2020-10-23 LAB — GLUCOSE, CAPILLARY
Glucose-Capillary: 119 mg/dL — ABNORMAL HIGH (ref 70–99)
Glucose-Capillary: 120 mg/dL — ABNORMAL HIGH (ref 70–99)
Glucose-Capillary: 177 mg/dL — ABNORMAL HIGH (ref 70–99)
Glucose-Capillary: 161 mg/dL — ABNORMAL HIGH (ref 70–99)

## 2020-10-23 NOTE — TOC Progression Note (Signed)
Transition of Care [facility]) - Progression Note    Patient Details  Name: Juan [name] MRN: [id number] Date of Birth: 09/30/1935  Transition of Care [facility]) CM/SW [place], LCSW Phone Number: 10/23/2020, 2:16 PM  Clinical Narrative:    CSW spoke with patient's daughter Arbie Cookey over the phone to discuss SNF. CSW noted family wanted him to go to the SNF at [facility]. CSW informed patient they can reach out to their admissions tomorrow. CSW noted they already started a conversation with them regarding the possible need. CSW will go ahead and prepare an FL-2 and send notes over to [facility]. TOC will follow-up with facility 10/24/2020 regarding acceptance and insurance auth.      Expected Discharge Plan and Services                                                 Social Determinants of Health (SDOH) Interventions    Readmission Risk Interventions No flowsheet data found.

## 2020-10-23 NOTE — NC FL2 (Signed)
[place] LEVEL OF CARE SCREENING TOOL     IDENTIFICATION  Patient Name: Juan [name] Birthdate: 1935/09/29 Sex: male Admission Date (Current Location): 10/20/2020  [facility] and [place] Number:  Herbalist and Address:  [facility] [place]. [facility], [place] [address] [place], [place] [address]      Provider Number: [id number]  Attending Physician Name and Address:  Geradine Girt, DO  Relative Name and Phone Number:  Dom[place] Sep, daughter, [phone]    Current Level of Care: Hospital Recommended Level of Care: [place] Prior Approval Number:    Date Approved/Denied:   PASRR Number:    Discharge Plan: Other (Comment) (When DC from SNF return to ALF)    Current Diagnoses: Patient Active Problem List   Diagnosis Date Noted  . Recurrent falls 10/18/2020  . Physical deconditioning 10/14/2020  . Diabetes mellitus without complication ([place]) 12/09/7626  . 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 Shuffling gait 12/29/2015  . Benign prostatic hyperplasia with urinary obstruction 08/28/2015  . Erectile dysfunction due to arterial insufficiency 08/28/2015  . Inguinal hernia, right s/p repair with mesh 09/15/15 08/28/2015  . Dysplastic nevus 09/05/2014  . History of pancreatitis 06/25/2013  . CKD (chronic kidney disease) 06/24/2013  . TIA (transient ischemic attack) 02/07/2012  . Essential hypertension, benign 03/02/2009  . DIVERTICULAR DISEASE 03/01/2009  . PERSONAL HX COLONIC POLYPS 03/19/2008  . History of gout 12/09/2007  . Coronary atherosclerosis 12/09/2007  . Mixed hyperlipidemia 06/04/2007    Orientation RESPIRATION BLADDER Height & Weight        Normal Continent Weight:   Height:     BEHAVIORAL SYMPTOMS/MOOD NEUROLOGICAL BOWEL NUTRITION STATUS      Continent Diet (carb modified)  AMBULATORY STATUS COMMUNICATION OF NEEDS Skin   Limited Assist Verbally Normal                       Personal Care  Assistance Level of Assistance  Bathing,Feeding,Dressing Bathing Assistance: Limited assistance Feeding assistance: Independent Dressing Assistance: Limited assistance     Functional Limitations Info  Hearing   Hearing Info: Impaired      SPECIAL CARE FACTORS FREQUENCY  PT (By licensed PT),OT (By licensed OT)     PT Frequency: 5x weekly OT Frequency: 5x weekly            Contractures Contractures Info: Not present    Additional Factors Info  Code Status,Allergies Code Status Info: Full Allergies Info: simvastatin           Current Medications (10/23/2020):  This is the current hospital active medication list Current Facility-Administered Medications  Medication Dose Route Frequency Provider Last Rate Last Admin  . acetaminophen (TYLENOL) tablet 650 mg  650 mg Oral Q4H PRN Dessa Phi, DO   650 mg at 10/23/20 0857   Or  . acetaminophen (TYLENOL) 160 MG/5ML solution 650 mg  650 mg Per Tube Q4H PRN Dessa Phi, DO       Or  . acetaminophen (TYLENOL) suppository 650 mg  650 mg Rectal Q4H PRN Dessa Phi, DO      . allopurinol (ZYLOPRIM) tablet 100 mg  100 mg Oral Daily Dessa Phi, DO   100 mg at 10/23/20 0818  . aspirin EC tablet 81 mg  81 mg Oral Daily Dessa Phi, DO   81 mg at 10/23/20 0818  . clopidogrel (PLAVIX) tablet 75 mg  75 mg Oral Daily Rosalin Hawking, MD   75  mg at 10/23/20 0817  . finasteride (PROSCAR) tablet 5 mg  5 mg Oral Daily Dessa Phi, DO   5 mg at 10/23/20 0817  . heparin injection 5,000 Units  5,000 Units Subcutaneous Q8H Dessa Phi, DO   5,000 Units at 10/23/20 1314  . insulin aspart (novoLOG) injection 0-9 Units  0-9 Units Subcutaneous TID WC Dessa Phi, DO   2 Units at 10/22/20 1709  . pravastatin (PRAVACHOL) tablet 20 mg  20 mg Oral Once per day on Mon Wed Fri Dessa Phi, DO   20 mg at 10/21/20 0859  . senna-docusate (Senokot-S) tablet 1 tablet  1 tablet Oral QHS PRN Dessa Phi, DO         Discharge  Medications: Please see discharge summary for a list of discharge medications.  Relevant Imaging Results:  Relevant Lab Results:   Additional Information SSN: [id number]  Oretha Milch, LCSW

## 2020-10-23 NOTE — Progress Notes (Signed)
Progress Note    Juan [name]  NLG:[id number] DOB: 1935-07-14  DOA: 10/20/2020 PCP: Binnie Rail, MD    Brief Narrative:     Medical records reviewed and are as summarized below:  Juan [name] is an 85 y.o. male with medical history significant of coronary artery disease, gout, hyperlipidemia, hypertension, diabetes who presents to the hospital after being found confused and with a shuffling gait at the grocery store this afternoon.  His daughter at bedside is able to add to the history.  She states that over the past couple of weeks, patient has had increasing frequency of falls at home.  Patient resides at a [facility] facility.  Work up negative for CVA-- needs SNF placement.  Assessment/Plan:   Principal Problem:   TIA (transient ischemic attack) Active Problems:   History of gout   Essential hypertension, benign   CKD (chronic kidney disease)   Shuffling gait   Diabetes mellitus without complication (HCC)   Recurrent falls   small lacunar infarct  -CT head stable without acute intracranial abnormality -Unable to obtain MRI due to cochlear implant -CTA: No intracranial arterial occlusion or high-grade stenosis. 2. 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 -Neurology consulted: recommend aspirin 81, Plavix 75 DAPT for 3 weeks and then aspirin alone after. -Pravachol -Echo: No intracardiac source of embolism  detected on this transthoracic study. A transesophageal echocardiogram is  recommended to exclude cardiac source of embolism if clinically indicated.   CKD stage IIIa -Baseline creatinine around 1.3 -stable  Diabetes mellitus type 2, well controlled -Hemoglobin A1c in March 2022 was 6.7 -Hold metformin -Sliding scale insulin while inpatient  Hypertension -Hold Norvasc, Lopressor in setting of neurologic event  Gout -Continue allopurinol  Duplex negative for DVT  Family Communication/Anticipated D/C date and  plan/Code Status   DVT prophylaxis: Lovenox ordered. Code Status: Full Code.  Family Communication: at bedside Disposition Plan: Status is: Observation-    Dispo: The patient is from: Home              Anticipated d/c is to: rehab              Patient currently is not medically stable to d/c.   Difficult to place patient No         Medical Consultants:    neurology     Subjective:   Wants to go home  Objective:    Vitals:   10/22/20 1952 10/22/20 2345 10/23/20 0348 10/23/20 1156  BP: (!) 145/73 117/78 115/65 126/82  Pulse: 82 82 74 75  Resp: 18 18 15 16   Temp: 98.9 F (37.2 C) 97.7 F (36.5 C) 98.6 F (37 C) 98.1 F (36.7 C)  TempSrc: Oral Oral Oral Oral  SpO2: 95% 96% 96% 95%    Intake/Output Summary (Last 24 hours) at 10/23/2020 1344 Last data filed at 10/23/2020 0600 Gross per 24 hour  Intake 960 ml  Output --  Net 960 ml   There were no vitals filed for this visit.  Exam:  General: Appearance:     Overweight male in no acute distress     Lungs:      respirations unlabored  Heart:    Normal heart rate. Normal rhythm. No murmurs, rubs, or gallops.   MS:   All extremities are intact.   Neurologic:   Hard of hearing      Data Reviewed:   I have personally reviewed following labs and imaging studies:  Labs: Labs show the following:   Basic Metabolic Panel: Recent Labs  Lab 10/20/20 1315 10/21/20 0259 10/22/20 0021 10/22/20 1222  NA 139 140 140 138  K 4.2 3.5 3.6 3.6  CL 109 108 110 107  CO2 21* 25 24 24   GLUCOSE 137* 121* 110* 121*  BUN 34* 28* 31* 24*  CREATININE 1.40* 1.46* 1.73* 1.30*  CALCIUM 10.2 10.0 9.7 10.0   GFR Estimated Creatinine Clearance: 42.4 mL/min (A) (by C-G formula based on SCr of 1.3 mg/dL (H)). Liver Function Tests: Recent Labs  Lab 10/20/20 1315  AST 17  ALT 14  ALKPHOS 32*  BILITOT 0.4  PROT 6.3*  ALBUMIN 3.7   No results for input(s): LIPASE, AMYLASE in the last 168 hours. No results for  input(s): AMMONIA in the last 168 hours. Coagulation profile No results for input(s): INR, PROTIME in the last 168 hours.  CBC: Recent Labs  Lab 10/20/20 1315  WBC 6.3  NEUTROABS 3.9  HGB 12.5*  HCT 39.1  MCV 92.7  PLT 265   Cardiac Enzymes: No results for input(s): CKTOTAL, CKMB, CKMBINDEX, TROPONINI in the last 168 hours. BNP (last 3 results) No results for input(s): PROBNP in the last 8760 hours. CBG: Recent Labs  Lab 10/22/20 1101 10/22/20 1558 10/22/20 2110 10/23/20 0605 10/23/20 1100  GLUCAP 131* 187* 139* 119* 120*   D-Dimer: No results for input(s): DDIMER in the last 72 hours. Hgb A1c: Recent Labs    10/21/20 0259  HGBA1C 7.1*   Lipid Profile: Recent Labs    10/21/20 0259  CHOL 135  HDL 31*  LDLCALC 75  TRIG 145  CHOLHDL 4.4   Thyroid function studies: No results for input(s): TSH, T4TOTAL, T3FREE, THYROIDAB in the last 72 hours.  Invalid input(s): FREET3 Anemia work up: No results for input(s): VITAMINB12, FOLATE, FERRITIN, TIBC, IRON, RETICCTPCT in the last 72 hours. Sepsis Labs: Recent Labs  Lab 10/20/20 1315  WBC 6.3    Microbiology Recent Results (from the past 240 hour(s))  SARS CORONAVIRUS 2 (TAT 6-24 HRS) Nasopharyngeal Nasopharyngeal Swab     Status: None   Collection Time: 10/20/20  5:06 PM   Specimen: Nasopharyngeal Swab  Result Value Ref Range Status   SARS Coronavirus 2 NEGATIVE NEGATIVE Final    Comment: (NOTE) SARS-CoV-2 target nucleic acids are NOT DETECTED.  The SARS-CoV-2 RNA is generally detectable in upper and lower respiratory specimens during the acute phase of infection. Negative results do not preclude SARS-CoV-2 infection, do not rule out co-infections with other pathogens, and should not be used as the sole basis for treatment or other patient management decisions. Negative results must be combined with clinical observations, patient history, and epidemiological information. The expected result is  Negative.  Fact Sheet for Patients: SugarRoll.be  Fact Sheet for Healthcare Providers: https://www.woods-mathews.com/  This test is not yet approved or cleared by the [place] FDA and  has been authorized for detection and/or diagnosis of SARS-CoV-2 by FDA under an Emergency Use Authorization (EUA). This EUA will remain  in effect (meaning this test can be used) for the duration of the COVID-19 declaration under Se ction 564(b)(1) of the Act, 21 U.S.C. section 360bbb-3(b)(1), unless the authorization is terminated or revoked sooner.  Performed at [facility], [place] [address] [place], [place] [address]     Procedures and diagnostic studies:  CT ANGIO HEAD NECK W WO CM  Result Date: 10/21/2020 CLINICAL DATA:  Stroke/TIA, assess intracranial arteries EXAM: CT ANGIOGRAPHY HEAD AND  NECK TECHNIQUE: Multidetector CT imaging of the head and neck was performed using the standard protocol during bolus administration of intravenous contrast. Multiplanar CT image reconstructions and MIPs were obtained to evaluate the vascular anatomy. Carotid stenosis measurements (when applicable) are obtained utilizing NASCET criteria, using the distal internal carotid diameter as the denominator. CONTRAST:  54mL OMNIPAQUE IOHEXOL 350 MG/ML SOLN COMPARISON:  None. FINDINGS: CT HEAD FINDINGS Brain: There is no mass, hemorrhage or extra-axial collection. The size and configuration of the ventricles and extra-axial CSF spaces are normal. There is an old right basal ganglia lacunar infarct. Bone anchored hearing aid causes extensive streak artifact. The brain parenchyma is normal. Skull: The visualized skull base, calvarium and extracranial soft tissues are normal. Sinuses/Orbits: No fluid levels or advanced mucosal thickening of the visualized paranasal sinuses. No mastoid or middle ear effusion. The orbits are normal. CTA NECK FINDINGS SKELETON: There is no bony spinal  canal stenosis. No lytic or blastic lesion. OTHER NECK: Normal pharynx, larynx and major salivary glands. No cervical lymphadenopathy. Unremarkable thyroid gland. UPPER CHEST: No pneumothorax or pleural effusion. No nodules or masses. AORTIC ARCH: There is calcific atherosclerosis of the aortic arch. There is no aneurysm, dissection or hemodynamically significant stenosis of the visualized portion of the aorta. Conventional 3 vessel aortic branching pattern. The visualized proximal subclavian arteries are widely patent. RIGHT CAROTID SYSTEM: Normal without aneurysm, dissection or stenosis. LEFT CAROTID SYSTEM: Normal without aneurysm, dissection or stenosis. VERTEBRAL ARTERIES: Codominant configuration. Severe stenosis of the right vertebral artery origin (12:161, 162). There is no dissection, occlusion or flow-limiting stenosis to the skull base (V1-V3 segments). CTA HEAD FINDINGS POSTERIOR CIRCULATION: --Vertebral arteries: Normal V4 segments. --Inferior cerebellar arteries: Normal. --Basilar artery: Normal. --Superior cerebellar arteries: Normal. --Posterior cerebral arteries (PCA): Mild narrowing of the right P2 segment. ANTERIOR CIRCULATION: --Intracranial internal carotid arteries: Normal. --Anterior cerebral arteries (ACA): Normal. Both A1 segments are present. Patent anterior communicating artery (a-comm). --Middle cerebral arteries (MCA): Normal. VENOUS SINUSES: As permitted by contrast timing, patent. ANATOMIC VARIANTS: None Review of the MIP images confirms the above findings. IMPRESSION: 1. No intracranial arterial occlusion or high-grade stenosis. 2. 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 Aortic Atherosclerosis (ICD10-I70.0). Electronically Signed   By: Ulyses Jarred M.D.   On: 10/21/2020 20:13   VAS [place] LOWER EXTREMITY VENOUS (DVT)  Result Date: 10/22/2020  Lower Venous DVT Study Patient Name:  KAINEN YAX  Date of Exam:   10/22/2020 Medical Rec #: [id number]     Accession #:     [id number] Date of Birth: 11-03-1935     Patient Gender: M Patient Age:   84Y Exam Location:  [facility] Procedure:      VAS [place] LOWER EXTREMITY VENOUS (DVT) Referring Phys: [id number] Cornelius Moras XU --------------------------------------------------------------------------------  Indications: Left lower extremity pain.  Comparison Study: No prior study Performing Technologist: Maudry Mayhew MHA, RDMS, RVT, RDCS  Examination Guidelines: A complete evaluation includes B-mode imaging, spectral Doppler, color Doppler, and power Doppler as needed of all accessible portions of each vessel. Bilateral testing is considered an integral part of a complete examination. Limited examinations for reoccurring indications may be performed as noted. The reflux portion of the exam is performed with the patient in reverse Trendelenburg.  +-----+---------------+---------+-----------+----------+--------------+ RIGHTCompressibilityPhasicitySpontaneityPropertiesThrombus Aging +-----+---------------+---------+-----------+----------+--------------+ CFV  Full           Yes      Yes                                 +-----+---------------+---------+-----------+----------+--------------+   +---------+---------------+---------+-----------+----------+--------------+  LEFT     CompressibilityPhasicitySpontaneityPropertiesThrombus Aging +---------+---------------+---------+-----------+----------+--------------+ CFV      Full           Yes      Yes                                 +---------+---------------+---------+-----------+----------+--------------+ SFJ      Full                                                        +---------+---------------+---------+-----------+----------+--------------+ FV Prox  Full                                                        +---------+---------------+---------+-----------+----------+--------------+ FV Mid   Full                                                         +---------+---------------+---------+-----------+----------+--------------+ FV DistalFull                                                        +---------+---------------+---------+-----------+----------+--------------+ PFV      Full                                                        +---------+---------------+---------+-----------+----------+--------------+ POP      Full           Yes      Yes                                 +---------+---------------+---------+-----------+----------+--------------+ PTV      Full                                                        +---------+---------------+---------+-----------+----------+--------------+ PERO     Full                                                        +---------+---------------+---------+-----------+----------+--------------+     Summary: RIGHT: - No evidence of common femoral vein obstruction.  LEFT: - There is no evidence of deep vein thrombosis in the lower extremity.  - No cystic structure found in the popliteal fossa.  *See table(s) above for measurements and observations.  Preliminary     Medications:   . allopurinol  100 mg Oral Daily  . aspirin EC  81 mg Oral Daily  . clopidogrel  75 mg Oral Daily  . finasteride  5 mg Oral Daily  . heparin  5,000 Units Subcutaneous Q8H  . insulin aspart  0-9 Units Subcutaneous TID WC  . pravastatin  20 mg Oral Once per day on Mon Wed Fri   Continuous Infusions:    LOS: 0 days   Geradine Girt  [facility]   How to contact the [facility] Attending or Consulting provider [place] or covering provider during after hours [place], for this patient?  1. Check the care team in [facility] and look for a) attending/consulting TRH provider listed and b) the [facility] team listed 2. Log into [email or web address] and use [place]'s universal password to access. If you do not have the password, please contact the hospital operator. 3. Locate the [facility] provider you are  looking for under [facility] and page to a number that you can be directly reached. 4. If you still have difficulty reaching the provider, please page the [facility] (Director on Call) for the Hospitalists listed on amion for assistance.  10/23/2020, 1:44 PM

## 2020-10-24 DIAGNOSIS — I10 Essential (primary) hypertension: Secondary | ICD-10-CM | POA: Diagnosis not present

## 2020-10-24 LAB — RESP PANEL BY RT-PCR (FLU A&B, COVID) ARPGX2
Influenza A by PCR: NEGATIVE
Influenza B by PCR: NEGATIVE
SARS Coronavirus 2 by RT PCR: NEGATIVE

## 2020-10-24 LAB — GLUCOSE, CAPILLARY
Glucose-Capillary: 122 mg/dL — ABNORMAL HIGH (ref 70–99)
Glucose-Capillary: 120 mg/dL — ABNORMAL HIGH (ref 70–99)
Glucose-Capillary: 185 mg/dL — ABNORMAL HIGH (ref 70–99)
Glucose-Capillary: 182 mg/dL — ABNORMAL HIGH (ref 70–99)

## 2020-10-24 MED ORDER — CLOPIDOGREL BISULFATE 75 MG PO TABS: 75.0000 mg | ORAL_TABLET | Freq: Every day | ORAL | Status: DC

## 2020-10-24 NOTE — TOC Progression Note (Addendum)
Transition of Care [facility]) - Progression Note    Patient Details  Name: Juan [name] MRN: [id number] Date of Birth: 02-Feb-1936  Transition of Care [facility]) CM/SW [place], [place] Phone Number: 10/24/2020, 11:08 AM  Clinical Narrative:     CSW spoke to pt and daughter at bedside. CSW explained that they are waiting for a phone call from [place] to see if pt will be able to go there today or tomorrow, pending insurance auth.   3:30- Pts daughter gave CSW copy of medicare part A insurance card. CSW put it in pts chart. CSW requested covid test from MD. [facility] will work on Insurance underwriter auth with plan to DC 10/25/20. Pts daughter will transport via car.  Expected Discharge Plan: Skilled Nursing Facility Barriers to Discharge: SNF Pending bed offer,Insurance Authorization  Expected Discharge Plan and Services Expected Discharge Plan: [place] Choice: [place] arrangements for the past 2 months: [place] Expected Discharge Date: 10/24/20                                     Social Determinants of Health (SDOH) Interventions    Readmission Risk Interventions No flowsheet data found.  Emeterio Reeve, Latanya Presser, [place] Social Worker [phone]

## 2020-10-24 NOTE — Evaluation (Signed)
Speech Language Pathology Evaluation Patient Details Name: Juan [name] MRN: [id number] DOB: [birth year] Today's Date: 10/24/2020 Time: 2778-2423 SLP Time Calculation (min) (ACUTE ONLY): 26 min  Problem List:  Patient Active Problem List   Diagnosis Date Noted  . Recurrent falls 10/18/2020  . Physical deconditioning 10/14/2020  . Diabetes mellitus without complication ([place]) 53/61/4431  . 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 Shuffling gait 12/29/2015  . Benign prostatic hyperplasia with urinary obstruction 08/28/2015  . Erectile dysfunction due to arterial insufficiency 08/28/2015  . Inguinal hernia, right s/p repair with mesh 09/15/15 08/28/2015  . Dysplastic nevus 09/05/2014  . History of pancreatitis 06/25/2013  . CKD (chronic kidney disease) 06/24/2013  . TIA (transient ischemic attack) 02/07/2012  . Essential hypertension, benign 03/02/2009  . DIVERTICULAR DISEASE 03/01/2009  . PERSONAL HX COLONIC POLYPS 03/19/2008  . History of gout 12/09/2007  . Coronary atherosclerosis 12/09/2007  . Mixed hyperlipidemia 06/04/2007   Past Medical History:  Past Medical History:  Diagnosis Date  . Cancer ([place])    skin  . CKD (chronic kidney disease) 06/24/2013  . Coronary artery disease    a. s/p CABG 2000; b. myoview 9/08: inf-septal scar with mild peri-infarct ischemia (low risk);  c.  Echo 8/13: mild focal basal septal hypertrophy, Gr 1 DD, mild LAE;  d. Lexiscan Myoview (06/20/2013): No reversible ischemia, inferior and septal infarct, inferior and septal hypokinesis, EF 60%;  e.  Echo (06/20/2011): EF 50-55%, normal wall motion, grade 1 diastolic dysfunction, mild LAE  . Diverticular disease   . Duodenitis   . Elevated PSA   . GERD (gastroesophageal reflux disease)   . Gout   . Humerus fracture    right  . Hydronephrosis with ureteropelvic junction obstruction   . Hypercalcemia 02/02/12   a. 01/2012: 10.7.  Marland Kitchen Hyperlipidemia   . Hypertension   . Pancreatitis    a. ?  Simvastatin related  . Pneumonia age 62 or 67   "I think only once" (06/19/2013)  . Syncope    a. 06/2013  . TIA (transient ischemic attack)    a. 01/2012 - presentation as acute imbalance;  b. 01/2012 carotid U/S w/o signif stenosis;  c. 01/2012 Echo: EF 60-65%, Gr 1 DD.;  d.  Carotid US (06/20/2013): 1-39% bilateral ICA stenosis  . Tubular adenoma polyp of rectum   . Type II diabetes mellitus ([place])    "diet and exercise controlled at present" (06/19/2013)   Past Surgical History:  Past Surgical History:  Procedure Laterality Date  . APPENDECTOMY    . CARDIAC CATHETERIZATION  2000  . CHOLECYSTECTOMY    . COCHLEAR IMPLANT Right 1990's  . COLONOSCOPY W/ POLYPECTOMY     no polyp 2012  . CORONARY ARTERY BYPASS GRAFT  2000   CABG X5  . INGUINAL HERNIA REPAIR Right 09/15/2015   Procedure: OPEN REPAIR RIGHT INGUINAL HERNIA ;  Surgeon: Jackolyn Confer, MD;  Location: [facility];  Service: General;  Laterality: Right;  . INSERTION OF MESH Right 09/15/2015   Procedure: INSERTION OF MESH;  Surgeon: Jackolyn Confer, MD;  Location: [facility];  Service: General;  Laterality: Right;  . PARATHYROIDECTOMY  1978  . POLYPECTOMY    . REVERSE SHOULDER ARTHROPLASTY Right 12/30/2014   Procedure: Right shoulder ORIF, Biomet S3 Plate and ZImmer cable system ;  Surgeon: Netta Cedars, MD;  Location: [place];  Service: Orthopedics;  Laterality: Right;  . right ankle surgery  age 83  . TONSILLECTOMY  1943  . trachestomy as child  for throat closing     HPI:  85 y.o. male presenting to ED on 5/5 with AMS and gait disturbance resolving after 15 min. before resolving. Patient/family also note frequent falls x1 wk. CT  (-) for acute findings. Further work-up pending. PMHx significant for Hx of TIA 2013, CAD s/p CABG 2000, CKD, HLD, HTN and DM II.   Assessment / Plan / Recommendation Clinical Impression  From a cognitive-linguistic standpoint, patient is at baseline, confirmed per daughter in room for evaluation. Patient with mild  memory deficit and HOH which does impact communication both from an expressive and receptive standpoint for which patient compensates well. Per daughter, speech was less dysarthric up until a few years ago when wife passed and patient became less active socially, uses only closed caption on tv, has less conversation partners, etc. Speech is moderately dysarthric, appearing related to hearing impairment as oral motor exam unremarkable. Had plans to go to the [facility] clinic prior to [place]. Recommend skilled SLP services after d/c with possible f/u at [facility] clinic once returning to ALF.    SLP Assessment  SLP Recommendation/Assessment: All further Speech Lanaguage Pathology  needs can be addressed in the next venue of care SLP Visit Diagnosis: Dysarthria and anarthria (R47.1)    Follow Up Recommendations  Skilled Nursing facility          SLP Evaluation Cognition  Overall Cognitive Status: History of cognitive impairments - at baseline (mild, per daughter, patient at baseline) Orientation Level: Oriented X4       Comprehension  Auditory Comprehension Overall Auditory Comprehension: Appears within functional limits for tasks assessed Yes/No Questions:  (impacted by hearing loss, SLP wearing mask and patient does also lip read) Visual Recognition/Discrimination Discrimination: Within Function Limits Reading Comprehension Reading Status: Within funtional limits    Expression Expression Primary Mode of Expression: Verbal Verbal Expression Overall Verbal Expression: Appears within functional limits for tasks assessed Written Expression Dominant Hand: Right   Oral / Motor  Oral Motor/Sensory Function Overall Oral Motor/Sensory Function: Within functional limits Motor Speech Overall Motor Speech: Impaired at baseline Respiration: Within functional limits Phonation: Normal Resonance: Within functional limits Articulation: Impaired Level of Impairment: Word Intelligibility: Intelligibility  reduced Word: 75-100% accurate Phrase: 75-100% accurate Sentence: 75-100% accurate Conversation: 50-74% accurate Motor Planning: Witnin functional limits Motor Speech Errors: Not applicable Interfering Components: Hearing loss Effective Techniques: Over-articulate   GO             Shataria Crist MA, CCC-SLP         [place] Dann Meryl 10/24/2020, 10:52 AM

## 2020-10-24 NOTE — Discharge Summary (Signed)
Physician Discharge Summary  BELL CAI BHA:[id number] DOB: [birth year] DOA: 10/20/2020  PCP: Binnie Rail, MD  Admit date: 10/20/2020 Discharge date: 10/24/2020  Admitted From: home/ILF Discharge disposition: SNF   Recommendations for Outpatient Follow-Up:    recommend aspirin 81, Plavix 75 DAPT for 3 weeks and then aspirin alone after Close neurology follow up for TIA/CVA and memory issues   Discharge Diagnosis:   Principal Problem:   TIA (transient ischemic attack) Active Problems:   History of gout   Essential hypertension, benign   CKD (chronic kidney disease)   Shuffling gait   Diabetes mellitus without complication ([place])   Recurrent falls    Discharge Condition: Improved.  Diet recommendation: Low sodium, heart healthy.  Carbohydrate-modified  Wound care: None.  Code status: Full.   History of Present Illness:  Juan [name] is a 85 y.o. male with medical history significant of coronary artery disease, gout, hyperlipidemia, hypertension, diabetes who presents to the hospital after being found confused and with a shuffling gait at the grocery store this afternoon.  His daughter at bedside is able to add to the history.  She states that over the past couple of weeks, patient has had increasing frequency of falls at home.  Patient resides at a [facility] independent living facility.  Today, he went to get his haircut, was at the grocery store and states that he felt "off."  Apparently, he had attempted to hand his keys to the cashier instead of a credit card, was weak and needed assistance to sit down until EMS arrived.  Currently in the emergency department, patient is symptom-free and feeling well.  He states the entire episode lasted about 15 minutes.  He denies any recent illnesses, denies any lightheadedness, dizziness, chest pain, shortness of breath, cough, nausea, vomiting, diarrhea.  He was recently seen at urgent care for presumed gout.  He has an  appointment to see neurologist outpatient for shuffling gait and frequent falls.   Hospital Course by Problem:   small lacunar infarct -CT head stable without acute intracranial abnormality -Unable to obtain MRI due to cochlear implant -CTA: No intracranial arterial occlusion or high-grade stenosis. 2. 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 -Neurology consulted: recommend aspirin 81, Plavix 75 DAPT for 3 weeks and then aspirin alone after. -Pravachol -Echo: No intracardiac source of embolism  detected on this transthoracic study. A transesophageal echocardiogram is  recommended to exclude cardiac source of embolism if clinically indicated.   CKD stage IIIa -Baseline creatinine around 1.3 -stable  Diabetes mellitus type 2, well controlled -Hemoglobin A1c in March2022 was 6.7 -goal <7 Resume home meds  Hypertension -Hold Norvasc, Lopressor in setting of neurologic event  Gout -Continue allopurinol  Duplex negative for DVT     Medical Consultants:   neurology   Discharge Exam:   Vitals:   10/24/20 0412 10/24/20 0834  BP: 138/76 (!) 127/93  Pulse: 86 90  Resp: 18 17  Temp: 98 F (36.7 C) 97.8 F (36.6 C)  SpO2: 96% 97%   Vitals:   10/23/20 2007 10/23/20 2352 10/24/20 0412 10/24/20 0834  BP: 134/73 (!) 142/77 138/76 (!) 127/93  Pulse: 85 71 86 90  Resp: _0 Temp: 98.6 F (37 C) 97.9 F (36.6 C) 98 F (36.7 C) 97.8 F (36.6 C)  TempSrc: Oral Oral Oral Oral  SpO2: 95% 96% 96% 97%    General exam: Appears calm and comfortable.   The results  of significant diagnostics from this hospitalization (including imaging, microbiology, ancillary and laboratory) are listed below for reference.     Procedures and Diagnostic Studies:   CT ANGIO HEAD NECK W WO CM  Result Date: 10/21/2020 CLINICAL DATA:  Stroke/TIA, assess intracranial arteries EXAM: CT ANGIOGRAPHY HEAD AND NECK TECHNIQUE: Multidetector CT imaging of the head and  neck was performed using the standard protocol during bolus administration of intravenous contrast. Multiplanar CT image reconstructions and MIPs were obtained to evaluate the vascular anatomy. Carotid stenosis measurements (when applicable) are obtained utilizing NASCET criteria, using the distal internal carotid diameter as the denominator. CONTRAST:  34m OMNIPAQUE IOHEXOL 350 MG/ML SOLN COMPARISON:  None. FINDINGS: CT HEAD FINDINGS Brain: There is no mass, hemorrhage or extra-axial collection. The size and configuration of the ventricles and extra-axial CSF spaces are normal. There is an old right basal ganglia lacunar infarct. Bone anchored hearing aid causes extensive streak artifact. The brain parenchyma is normal. Skull: The visualized skull base, calvarium and extracranial soft tissues are normal. Sinuses/Orbits: No fluid levels or advanced mucosal thickening of the visualized paranasal sinuses. No mastoid or middle ear effusion. The orbits are normal. CTA NECK FINDINGS SKELETON: There is no bony spinal canal stenosis. No lytic or blastic lesion. OTHER NECK: Normal pharynx, larynx and major salivary glands. No cervical lymphadenopathy. Unremarkable thyroid gland. UPPER CHEST: No pneumothorax or pleural effusion. No nodules or masses. AORTIC ARCH: There is calcific atherosclerosis of the aortic arch. There is no aneurysm, dissection or hemodynamically significant stenosis of the visualized portion of the aorta. Conventional 3 vessel aortic branching pattern. The visualized proximal subclavian arteries are widely patent. RIGHT CAROTID SYSTEM: Normal without aneurysm, dissection or stenosis. LEFT CAROTID SYSTEM: Normal without aneurysm, dissection or stenosis. VERTEBRAL ARTERIES: Codominant configuration. Severe stenosis of the right vertebral artery origin (12:161, 162). There is no dissection, occlusion or flow-limiting stenosis to the skull base (V1-V3 segments). CTA HEAD FINDINGS POSTERIOR CIRCULATION:  --Vertebral arteries: Normal V4 segments. --Inferior cerebellar arteries: Normal. --Basilar artery: Normal. --Superior cerebellar arteries: Normal. --Posterior cerebral arteries (PCA): Mild narrowing of the right P2 segment. ANTERIOR CIRCULATION: --Intracranial internal carotid arteries: Normal. --Anterior cerebral arteries (ACA): Normal. Both A1 segments are present. Patent anterior communicating artery (a-comm). --Middle cerebral arteries (MCA): Normal. VENOUS SINUSES: As permitted by contrast timing, patent. ANATOMIC VARIANTS: None Review of the MIP images confirms the above findings. IMPRESSION: 1. No intracranial arterial occlusion or high-grade stenosis. 2. 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 Aortic Atherosclerosis (ICD10-I70.0). Electronically Signed   By: KUlyses JarredM.D.   On: 10/21/2020 20:13   CT Head Wo Contrast  Result Date: 10/20/2020 CLINICAL DATA:  Neuro deficit.  Acute stroke suspected. EXAM: CT HEAD WITHOUT CONTRAST TECHNIQUE: Contiguous axial images were obtained from the base of the skull through the vertex without intravenous contrast. COMPARISON:  12/28/2014 FINDINGS: Brain: There is no evidence for acute hemorrhage, hydrocephalus, mass lesion, or abnormal extra-axial fluid collection. No definite CT evidence for acute infarction. Portions of the right temporal and parietal lobes are obscured by beam hardening artifact from right cochlear implant hardware. Diffuse loss of parenchymal volume is consistent with atrophy. Patchy low attenuation in the deep hemispheric and periventricular white matter is nonspecific, but likely reflects chronic microvascular ischemic demyelination. Vascular: No hyperdense vessel or unexpected calcification. Skull: No evidence for fracture. No worrisome lytic or sclerotic lesion. Sinuses/Orbits: Right maxillary sinus is opacified. Opacification of anterior right ethmoid air cells noted. Frontal sinuses are clear. Surgical changes noted right  your region consistent with cochlear implant. Other: None. IMPRESSION: 1. Stable exam.  No acute intracranial abnormality. 2. Atrophy with chronic small vessel white matter disease. 3. Right cochlear implant. Electronically Signed   By: Misty Stanley M.D.   On: 10/20/2020 14:37   ECHOCARDIOGRAM COMPLETE  Result Date: 10/21/2020    ECHOCARDIOGRAM REPORT   Patient Name:   KALIK HOARE Date of Exam: 10/21/2020 Medical Rec #:  [id number]    Height:       68.0 in Accession #:    [id number]   Weight:       174.8 lb Date of Birth:  1936-01-13    BSA:          1.930 m Patient Age:    81 years     BP:           137/84 mmHg Patient Gender: M            HR:           67 bpm. Exam Location:  Inpatient Procedure: 2D Echo, Color Doppler and Cardiac Doppler Indications:    TIA G45.9  History:        Patient has prior history of Echocardiogram examinations, most                 recent 06/19/2013. CAD, TIA, Signs/Symptoms:Syncope; Risk                 Factors:Diabetes, Hypertension and Dyslipidemia.  Sonographer:    Bernadene Person RDCS Referring Phys: [id number] [place]  1. Left ventricular ejection fraction, by estimation, is 60 to 65%. The left ventricle has normal function. The left ventricle has no regional wall motion abnormalities. Left ventricular diastolic parameters were normal.  2. Right ventricular systolic function is normal. The right ventricular size is normal.  3. The IAS appears hypermobile. Borderline aneurysmal. No definitive PFO.  4. The mitral valve is grossly normal. Trivial mitral valve regurgitation. No evidence of mitral stenosis.  5. The aortic valve is tricuspid. There is mild calcification of the aortic valve. There is mild thickening of the aortic valve. Aortic valve regurgitation is not visualized. Mild aortic valve sclerosis is present, with no evidence of aortic valve stenosis.  6. The inferior vena cava is normal in size with greater than 50% respiratory variability, suggesting right  atrial pressure of 3 mmHg. Conclusion(s)/Recommendation(s): No intracardiac source of embolism detected on this transthoracic study. A transesophageal echocardiogram is recommended to exclude cardiac source of embolism if clinically indicated. FINDINGS  Left Ventricle: Left ventricular ejection fraction, by estimation, is 60 to 65%. The left ventricle has normal function. The left ventricle has no regional wall motion abnormalities. The left ventricular internal cavity size was normal in size. There is  no left ventricular hypertrophy. Left ventricular diastolic parameters were normal. Right Ventricle: The right ventricular size is normal. No increase in right ventricular wall thickness. Right ventricular systolic function is normal. Left Atrium: Left atrial size was normal in size. Right Atrium: Right atrial size was normal in size. Pericardium: Trivial pericardial effusion is present. Presence of pericardial fat pad. Mitral Valve: The mitral valve is grossly normal. Trivial mitral valve regurgitation. No evidence of mitral valve stenosis. Tricuspid Valve: The tricuspid valve is grossly normal. Tricuspid valve regurgitation is trivial. No evidence of tricuspid stenosis. Aortic Valve: The aortic valve is tricuspid. There is mild calcification of the aortic valve. There is mild thickening of the aortic valve. Aortic valve regurgitation is not visualized. Mild  aortic valve sclerosis is present, with no evidence of aortic valve stenosis. Pulmonic Valve: The pulmonic valve was grossly normal. Pulmonic valve regurgitation is not visualized. No evidence of pulmonic stenosis. Aorta: The aortic root and ascending aorta are structurally normal, with no evidence of dilitation. Venous: The inferior vena cava is normal in size with greater than 50% respiratory variability, suggesting right atrial pressure of 3 mmHg. IAS/Shunts: There is redundancy of the interatrial septum. The atrial septum is grossly normal.  LEFT VENTRICLE  PLAX 2D LVIDd:         4.50 cm  Diastology LVIDs:         3.00 cm  LV e' medial:    8.27 cm/s LV PW:         1.00 cm  LV E/e' medial:  6.0 LV IVS:        1.00 cm  LV e' lateral:   9.32 cm/s LVOT diam:     2.20 cm  LV E/e' lateral: 5.4 LV SV:         80 LV SV Index:   42 LVOT Area:     3.80 cm  RIGHT VENTRICLE RV S prime:     12.50 cm/s TAPSE (M-mode): 1.9 cm LEFT ATRIUM             Index       RIGHT ATRIUM           Index LA diam:        3.80 cm 1.97 cm/m  RA Area:     13.70 cm LA Vol (A2C):   34.3 ml 17.77 ml/m RA Volume:   27.50 ml  14.25 ml/m LA Vol (A4C):   24.0 ml 12.43 ml/m LA Biplane Vol: 30.9 ml 16.01 ml/m  AORTIC VALVE LVOT Vmax:   124.00 cm/s LVOT Vmean:  80.700 cm/s LVOT VTI:    0.211 m  AORTA Ao Root diam: 3.50 cm Ao Asc diam:  3.90 cm MITRAL VALVE MV Area (PHT): 2.05 cm    SHUNTS MV Decel Time: 370 msec    Systemic VTI:  0.21 m MV E velocity: 49.90 cm/s  Systemic Diam: 2.20 cm MV A velocity: 70.60 cm/s MV E/A ratio:  0.71 Eleonore Chiquito MD Electronically signed by Eleonore Chiquito MD Signature Date/Time: 10/21/2020/2:50:55 PM    Final      Labs:   Basic Metabolic Panel: Recent Labs  Lab 10/20/20 1315 10/21/20 0259 10/22/20 0021 10/22/20 1222  NA 139 140 140 138  K 4.2 3.5 3.6 3.6  CL 109 108 110 107  CO2 21* _0 GLUCOSE 137* 121* 110* 121*  BUN 34* 28* 31* 24*  CREATININE 1.40* 1.46* 1.73* 1.30*  CALCIUM 10.2 10.0 9.7 10.0   GFR Estimated Creatinine Clearance: 42.4 mL/min (A) (by C-G formula based on SCr of 1.3 mg/dL (H)). Liver Function Tests: Recent Labs  Lab 10/20/20 1315  AST 17  ALT 14  ALKPHOS 32*  BILITOT 0.4  PROT 6.3*  ALBUMIN 3.7   No results for input(s): LIPASE, AMYLASE in the last 168 hours. No results for input(s): AMMONIA in the last 168 hours. Coagulation profile No results for input(s): INR, PROTIME in the last 168 hours.  CBC: Recent Labs  Lab 10/20/20 1315  WBC 6.3  NEUTROABS 3.9  HGB 12.5*  HCT 39.1  MCV 92.7  PLT 265    Cardiac Enzymes: No results for input(s): CKTOTAL, CKMB, CKMBINDEX, TROPONINI in the last 168 hours. BNP: Invalid input(s): POCBNP CBG: Recent  Labs  Lab 10/23/20 0605 10/23/20 1100 10/23/20 1555 10/23/20 2102 10/24/20 0613  GLUCAP 119* 120* 177* 161* 122*   D-Dimer No results for input(s): DDIMER in the last 72 hours. Hgb A1c No results for input(s): HGBA1C in the last 72 hours. Lipid Profile No results for input(s): CHOL, HDL, LDLCALC, TRIG, CHOLHDL, LDLDIRECT in the last 72 hours. Thyroid function studies No results for input(s): TSH, T4TOTAL, T3FREE, THYROIDAB in the last 72 hours.  Invalid input(s): FREET3 Anemia work up No results for input(s): VITAMINB12, FOLATE, FERRITIN, TIBC, IRON, RETICCTPCT in the last 72 hours. Microbiology Recent Results (from the past 240 hour(s))  SARS CORONAVIRUS 2 (TAT 6-24 HRS) Nasopharyngeal Nasopharyngeal Swab     Status: None   Collection Time: 10/20/20  5:06 PM   Specimen: Nasopharyngeal Swab  Result Value Ref Range Status   SARS Coronavirus 2 NEGATIVE NEGATIVE Final    Comment: (NOTE) SARS-CoV-2 target nucleic acids are NOT DETECTED.  The SARS-CoV-2 RNA is generally detectable in upper and lower respiratory specimens during the acute phase of infection. Negative results do not preclude SARS-CoV-2 infection, do not rule out co-infections with other pathogens, and should not be used as the sole basis for treatment or other patient management decisions. Negative results must be combined with clinical observations, patient history, and epidemiological information. The expected result is Negative.  Fact Sheet for Patients: SugarRoll.be  Fact Sheet for Healthcare Providers: https://www.woods-mathews.com/  This test is not yet approved or cleared by the [place] FDA and  has been authorized for detection and/or diagnosis of SARS-CoV-2 by FDA under an Emergency Use Authorization  (EUA). This EUA will remain  in effect (meaning this test can be used) for the duration of the COVID-19 declaration under Se ction 564(b)(1) of the Act, 21 U.S.C. section 360bbb-3(b)(1), unless the authorization is terminated or revoked sooner.  Performed at [facility], [place] [address] [place], [place] [address]      Discharge Instructions:   Discharge Instructions    Ambulatory referral to Neurology   Complete by: As directed    An appointment is requested in approximately: {6 weeks   Diet - low sodium heart healthy   Complete by: As directed    Diet Carb Modified   Complete by: As directed    Increase activity slowly   Complete by: As directed      Allergies as of 10/24/2020      Reactions   Simvastatin Other (See Comments)   Possible statin-induced pancreatitis      Medication List    STOP taking these medications   amLODipine 2.5 MG tablet Commonly known as: NORVASC   metoprolol tartrate 50 MG tablet Commonly known as: LOPRESSOR     TAKE these medications   acetaminophen 500 MG tablet Commonly known as: TYLENOL Take 500-1,000 mg by mouth every 6 (six) hours as needed for mild pain (or headaches).   allopurinol 100 MG tablet Commonly known as: ZYLOPRIM TAKE 1 TABLET(100 MG) BY MOUTH DAILY What changed: See the new instructions.   aspirin EC 81 MG tablet Take 81 mg by mouth daily.   blood glucose meter kit and supplies Kit Dispense based on patient and insurance preference. Use up to four times daily as directed. (FOR ICD-9 250.00, 250.01).   clopidogrel 75 MG tablet Commonly known as: PLAVIX Take 1 tablet (75 mg total) by mouth daily. Start taking on: Oct 25, 2020   colchicine 0.6 MG tablet Take 0.6 mg by mouth daily as  needed (as directed for gout flares).   Fenofibric Acid 135 MG Cpdr TAKE 1 CAPSULE BY MOUTH DAILY What changed: how much to take   finasteride 5 MG tablet Commonly known as: PROSCAR Take 5 mg by mouth every evening.    metFORMIN 500 MG tablet Commonly known as: GLUCOPHAGE TAKE 1 TABLET(500 MG) BY MOUTH DAILY WITH BREAKFAST What changed: See the new instructions.   pravastatin 20 MG tablet Commonly known as: PRAVACHOL Take 1 tablet (20 mg total) by mouth 3 (three) times a week. What changed: when to take this       Follow-up Information    Binnie Rail, MD Follow up in 1 week(s).   Specialty: Internal Medicine Contact information: [place] [address] [phone]        [name], MD .   Specialty: Cardiology Contact information: [phone] [address] [place] [address] [phone]                Time coordinating discharge: 35 min  Signed:  Geradine Girt DO  [facility] 10/24/2020, 9:52 AM

## 2020-10-24 NOTE — Progress Notes (Signed)
Physical Therapy Treatment Patient Details Name: Juan [name] MRN: [id number] DOB: April 05, 1936 Today's Date: 10/24/2020    History of Present Illness 85 y.o. male presenting to ED on 5/5 with AMS and gait disturbance resolving after 15 min. before resolving. Patient/family also note frequent falls x1 wk. CT  (-) for acute findings. Further work-up pending. PMHx significant for Hx of TIA 2013, CAD s/p CABG 2000, CKD, HLD, HTN and DM II.    PT Comments    Continuing work on functional mobility and activity tolerance;  Session focused on progressive amb, education to decr fall risk, and serial sit<>stands; Overall tolerated quite well, and participated beautifully; Fall risk still very present, and agree with rehab stay at [facility] to maximize independence and safety with mobility and ADLs prior to return to [place]   Follow Up Recommendations  SNF     Equipment Recommendations  None recommended by PT    Recommendations for Other Services       Precautions / Restrictions Precautions Precautions: Fall Precaution Comments: High fall risk Restrictions Other Position/Activity Restrictions: requires RW due to balance skills    Mobility  Bed Mobility               General bed mobility comments: received in chair    Transfers Overall transfer level: Needs assistance Equipment used: Rolling walker (2 wheeled) Transfers: Sit to/from Stand Sit to Stand: Min assist;Min guard         General transfer comment: Min A for power up from chair initially; Difficulty with rise when trying to pull up on RW to stand; better rise pushing up from armrests; performed serial sit to stands; 3 sets of  5 reps in quick succession  Ambulation/Gait Ambulation/Gait assistance: Min assist Gait Distance (Feet): 150 Feet Assistive device: Rolling walker (2 wheeled);Straight cane Gait Pattern/deviations: Decreased stance time - right;Decreased stance time - left;Wide base of  support     General Gait Details: Initially with short steps, wide step length, festination-like pattern; imporved with cues to take slow, long steps; heavy dependence on RW   Stairs             Wheelchair Mobility    Modified Rankin (Stroke Patients Only)       Balance     Sitting balance-Leahy Scale: Good       Standing balance-Leahy Scale: Fair                              Cognition Arousal/Alertness: Awake/alert Behavior During Therapy: WFL for tasks assessed/performed;Impulsive Overall Cognitive Status: History of cognitive impairments - at baseline                                        Exercises      General Comments General comments (skin integrity, edema, etc.): Patient somewhat difficulty to understand. Has cochlear implant. Patient with poor safety awareness and poor awareness of balance deficits.      Pertinent Vitals/Pain Pain Assessment: No/denies pain    Home Living                      Prior Function            PT Goals (current goals can now be found in the care plan section) Acute Rehab PT Goals Patient Stated Goal: return  to ILF PT Goal Formulation: With patient Time For Goal Achievement: 11/04/20 Potential to Achieve Goals: Good Progress towards PT goals: Progressing toward goals    Frequency    Min 3X/week      PT Plan Current plan remains appropriate    Co-evaluation              AM-PAC PT "6 Clicks" Mobility   Outcome Measure  Help needed turning from your back to your side while in a flat bed without using bedrails?: None Help needed moving from lying on your back to sitting on the side of a flat bed without using bedrails?: A Little Help needed moving to and from a bed to a chair (including a wheelchair)?: A Little Help needed standing up from a chair using your arms (e.g., wheelchair or bedside chair)?: A Lot Help needed to walk in hospital room?: A Little Help needed  climbing 3-5 steps with a railing? : A Lot 6 Click Score: 17    End of Session Equipment Utilized During Treatment: Gait belt Activity Tolerance: Patient tolerated treatment well;Treatment limited secondary to medical complications (Comment) Patient left: in chair;with call bell/phone within reach;with family/visitor present Nurse Communication: Mobility status PT Visit Diagnosis: History of falling (Z91.81);Difficulty in walking, not elsewhere classified (R26.2)     Time: 3888-2800 PT Time Calculation (min) (ACUTE ONLY): 38 min  Charges:  $Gait Training: 23-37 mins $Therapeutic Activity: 8-22 mins                     Roney Marion, PT  Acute Rehabilitation Services Pager [phone] Office [place] 10/24/2020, 5:43 PM

## 2020-10-24 NOTE — Progress Notes (Signed)
Occupational Therapy Treatment Patient Details Name: Juan [name] MRN: [id number] DOB: [birth year] Today's Date: 10/24/2020    History of present illness 85 y.o. male presenting to ED on 5/5 with AMS and gait disturbance resolving after 15 min. before resolving. Patient/family also note frequent falls x1 wk. CT  (-) for acute findings. Further work-up pending. PMHx significant for Hx of TIA 2013, CAD s/p CABG 2000, CKD, HLD, HTN and DM II.   OT comments  Pt progressing towards OT goals and motivated to participate. Pt continues to present with increased risk for falls during dynamic tasks. Pt with improved steadiness when using RW with cues for pacing during DME use, but did require one instance of Min A to correct a lateral LOB with hallway mobility. Daughter present at start of session and supportive. Provided fall prevention handout and education on strategies to use during LB ADLs, as well as home modification considerations to decrease fall risk. Pt agreeable for rehab at [facility] before returning to his ILF.    Follow Up Recommendations  SNF;Supervision - Intermittent    Equipment Recommendations  Other (comment) (Rolling walker)    Recommendations for Other Services      Precautions / Restrictions Precautions Precautions: Fall Restrictions Weight Bearing Restrictions: No       Mobility Bed Mobility               General bed mobility comments: received in chair    Transfers Overall transfer level: Needs assistance Equipment used: Rolling walker (2 wheeled) Transfers: Sit to/from Stand Sit to Stand: Min assist         General transfer comment: Min A for power up from chair    Balance Overall balance assessment: Needs assistance Sitting-balance support: Feet supported Sitting balance-Leahy Scale: Good     Standing balance support: Bilateral upper extremity supported;Single extremity supported;During functional activity Standing balance-Leahy Scale:  Fair Standing balance comment: fair static standing, need for UE support for dynamic tasks and Min A to correct one lateral LOB                           ADL either performed or assessed with clinical judgement   ADL Overall ADL's : Needs assistance/impaired                                     Functional mobility during ADLs: Minimal assistance;Rolling walker;Cueing for sequencing;Cueing for safety General ADL Comments: Pt reports having just ambulated from bathroom. Provided fall prevention strategy handout and educated on home environment modifications to use, as well as safety strategies for LB ADLs (use of shower chair, performing dressing tasks seated). pt requires Min A at times to correct balance with mobility using RW. When turning head or distracted, balanace deficits noted     Vision   Vision Assessment?: No apparent visual deficits   Perception     Praxis      Cognition Arousal/Alertness: Awake/alert Behavior During Therapy: WFL for tasks assessed/performed;Impulsive Overall Cognitive Status: History of cognitive impairments - at baseline Area of Impairment: Problem solving;Awareness;Safety/judgement;Following commands;Memory;Attention;Orientation                         Safety/Judgement: Decreased awareness of safety;Decreased awareness of deficits Awareness: Emergent Problem Solving: Slow processing;Requires verbal cues;Requires tactile cues General Comments: mild cognitive impairments at baseline, per daughter. decreased  awareness of fall risk        Exercises     Shoulder Instructions       General Comments      Pertinent Vitals/ Pain       Pain Assessment: No/denies pain  Home Living     Available Help at Discharge: Family;Available PRN/intermittently Type of Home: Independent living facility                                  Prior Functioning/Environment              Frequency  Min 2X/week         Progress Toward Goals  OT Goals(current goals can now be found in the care plan section)  Progress towards OT goals: Progressing toward goals  Acute Rehab OT Goals Patient Stated Goal: return to ILF OT Goal Formulation: With patient Time For Goal Achievement: 11/04/20 Potential to Achieve Goals: Good ADL Goals Additional ADL Goal #1: Patient will complete ADL tranfsers with Mod I and safe use of AE. Additional ADL Goal #2: Patient will complete 3/3 grooming tasks standing at sink with Mod I and safe use of AD. Additional ADL Goal #3: Patient will recall 3 fall risk reducing strategies in prep for safe d/c to next level of care.  Plan Discharge plan remains appropriate    Co-evaluation                 AM-PAC OT "6 Clicks" Daily Activity     Outcome Measure   Help from another person eating meals?: None Help from another person taking care of personal grooming?: A Little Help from another person toileting, which includes using toliet, bedpan, or urinal?: A Little Help from another person bathing (including washing, rinsing, drying)?: A Little Help from another person to put on and taking off regular upper body clothing?: A Little Help from another person to put on and taking off regular lower body clothing?: A Little 6 Click Score: 19    End of Session Equipment Utilized During Treatment: Gait belt;Rolling walker  OT Visit Diagnosis: Unsteadiness on feet (R26.81);Muscle weakness (generalized) (M62.81);Repeated falls (R29.6)   Activity Tolerance Patient tolerated treatment well   Patient Left in chair;with call bell/phone within reach;Other (comment) (no chair alarm in place on entry)   Nurse Communication          Time: 4580-9983 OT Time Calculation (min): 19 min  Charges: OT General Charges $OT Visit: 1 Visit OT Treatments $Therapeutic Activity: 8-22 mins  Malachy Chamber, OTR/L Acute Rehab Services Office: [phone]   Layla Maw 10/24/2020,  11:41 AM

## 2020-10-25 ENCOUNTER — Non-Acute Institutional Stay (SKILLED_NURSING_FACILITY): Payer: Federal, State, Local not specified - PPO | Admitting: Nurse Practitioner

## 2020-10-25 DIAGNOSIS — G459 Transient cerebral ischemic attack, unspecified: Secondary | ICD-10-CM

## 2020-10-25 DIAGNOSIS — I2510 Atherosclerotic heart disease of native coronary artery without angina pectoris: Secondary | ICD-10-CM

## 2020-10-25 DIAGNOSIS — H9191 Unspecified hearing loss, right ear: Secondary | ICD-10-CM

## 2020-10-25 DIAGNOSIS — N1831 Chronic kidney disease, stage 3a: Secondary | ICD-10-CM | POA: Diagnosis not present

## 2020-10-25 DIAGNOSIS — Z8739 Personal history of other diseases of the musculoskeletal system and connective tissue: Secondary | ICD-10-CM

## 2020-10-25 DIAGNOSIS — E119 Type 2 diabetes mellitus without complications: Secondary | ICD-10-CM

## 2020-10-25 DIAGNOSIS — R2689 Other abnormalities of gait and mobility: Secondary | ICD-10-CM

## 2020-10-25 DIAGNOSIS — R413 Other amnesia: Secondary | ICD-10-CM

## 2020-10-25 DIAGNOSIS — N401 Enlarged prostate with lower urinary tract symptoms: Secondary | ICD-10-CM

## 2020-10-25 DIAGNOSIS — I10 Essential (primary) hypertension: Secondary | ICD-10-CM | POA: Diagnosis not present

## 2020-10-25 DIAGNOSIS — E782 Mixed hyperlipidemia: Secondary | ICD-10-CM

## 2020-10-25 DIAGNOSIS — N138 Other obstructive and reflux uropathy: Secondary | ICD-10-CM

## 2020-10-25 DIAGNOSIS — R296 Repeated falls: Secondary | ICD-10-CM

## 2020-10-25 LAB — GLUCOSE, CAPILLARY
Glucose-Capillary: 136 mg/dL — ABNORMAL HIGH (ref 70–99)
Glucose-Capillary: 126 mg/dL — ABNORMAL HIGH (ref 70–99)

## 2020-10-25 NOTE — Care Management (Signed)
PAtient's social security number has been corrected in [place] system: [id number]. [place] spoke to Dale. She looked patient up in [place] and confirmed number. Patient can transfer today. Nurse to call report to [phone] ext [phone]. Secure chatted nurse and charge nurse on [facility]. Daughter can transport. Daughter aware.  Magdalen Spatz RN

## 2020-10-25 NOTE — Progress Notes (Addendum)
D/c done 5/9.  Continue to await insurance authorization.  Will go to rehab at [facility].   Patient in chair, NAD- no overnight events Eulogio Bear DO

## 2020-10-25 NOTE — Social Work (Signed)
Patients SSN is registered to another pt in [place]. Family provided copy of medicare information that has pts SSN attached. CSW sent it to [employer] to be verified. CSW is waiting for [employer] to verify so pt can be issued a new passr number and then DC to [facility] [place], [place], [place] Social Worker [phone]

## 2020-10-25 NOTE — Progress Notes (Signed)
Patient discharge and given all instructions about medicine and follow up visit. Report given to the Nurse Jefm Bryant from [facility]. All  Belongings given to the patient's daughter. No iv assess. Patient's daughter is transporting via car.

## 2020-10-26 ENCOUNTER — Encounter: Payer: Self-pay | Admitting: Nurse Practitioner

## 2020-10-26 ENCOUNTER — Encounter: Payer: Self-pay | Admitting: Internal Medicine

## 2020-10-26 DIAGNOSIS — H9190 Unspecified hearing loss, unspecified ear: Secondary | ICD-10-CM | POA: Insufficient documentation

## 2020-10-26 DIAGNOSIS — R413 Other amnesia: Secondary | ICD-10-CM | POA: Insufficient documentation

## 2020-10-26 NOTE — Assessment & Plan Note (Signed)
takes Allopurinol, prn Colchicine,  venous US was negative for DVT

## 2020-10-26 NOTE — Assessment & Plan Note (Signed)
Hospitalized 10/20/20-10/24/20 for TIA/CVA,  placed on Plavix 75mg , ASA 81mg  x 3 weeks then ASA 81mg  alone, f/u Neurology. 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 Small lacunar infarct.

## 2020-10-26 NOTE — Assessment & Plan Note (Signed)
Hgb a1c 7.1 10/21/20, takes Metformin

## 2020-10-26 NOTE — Assessment & Plan Note (Signed)
Controlled, off meds.  

## 2020-10-26 NOTE — Assessment & Plan Note (Signed)
,   shuffling.

## 2020-10-26 NOTE — Assessment & Plan Note (Signed)
F/u neurology 

## 2020-10-26 NOTE — Assessment & Plan Note (Signed)
Bun/creat 24/1.30 10/22/20

## 2020-10-26 NOTE — Assessment & Plan Note (Signed)
Echo no intracardiac source of embolism 

## 2020-10-26 NOTE — Assessment & Plan Note (Signed)
R cochlear implant.

## 2020-10-26 NOTE — Progress Notes (Signed)
Location:   [facility] Room Number: Juan [name] of Service:  SNF (31) Provider:  Sanel Stemmer Otho Darner, NP    Patient Care Team: Binnie Rail, MD as PCP - General (Internal Medicine) [name], MD as PCP - Cardiology (Cardiology)  Extended Emergency Contact Information Primary Emergency Contact: [name],[name]          [place], [place] Phone: [phone] Mobile Phone: [phone] Relation: Daughter Secondary Emergency Contact: [name],[name] Address: [address]           [place], [place] [address] [name] of [place] Phone: [phone] Mobile Phone: [phone] Relation: Daughter  Code Status:  Full Code Goals of care: Advanced Directive information Advanced Directives 10/27/2020  Does Patient Have a Medical Advance Directive? Yes  Type of Paramedic of [place];Living will  Does patient want to make changes to medical advance directive? No - Patient declined  Copy of [place] in Chart? Yes - validated most recent copy scanned in chart (See row information)  Would patient like information on creating a medical advance directive? -     Chief Complaint  Patient presents with  . Hospitalization Follow-up    Hospital follow up and medication review.    HPI:  Pt is a 85 y.o. male seen today for medication review following hospital stay.   Hospitalized 10/20/20-10/24/20 for TIA/CVA,  placed on Plavix 75mg , ASA 81mg  x 3 weeks then ASA 81mg  alone, f/u Neurology. 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 Small lacunar infarct.   Hx of Gout, takes Allopurinol, prn Colchicine,  venous US was negative for DVT  HTN, off meds.   CKD Bun/creat 24/1.30 10/22/20  Gait abnormality, shuffling.   T2DM Hgb a1c 7.1 10/21/20, takes Metformin  Recurrent falls, SNF [facility] for therapy.   CAD Echo no intracardiac source of embolism  Hyperlipidemia,  on Pravachol, Fenofibric acid,  LDL 75 10/21/20  Memory issues f/u neurology  HOH, cochlear implant R  Urinary frequency, takes Finasteride,     Past Medical History:  Diagnosis Date  . Cancer ([place])    skin  . CKD (chronic kidney disease) 06/24/2013  . Coronary artery disease    a. s/p CABG 2000; b. myoview 9/08: inf-septal scar with mild peri-infarct ischemia (low risk);  c.  Echo 8/13: mild focal basal septal hypertrophy, Gr 1 DD, mild LAE;  d. Lexiscan Myoview (06/20/2013): No reversible ischemia, inferior and septal infarct, inferior and septal hypokinesis, EF 60%;  e.  Echo (06/20/2011): EF 50-55%, normal wall motion, grade 1 diastolic dysfunction, mild LAE  . Diverticular disease   . Duodenitis   . Elevated PSA   . GERD (gastroesophageal reflux disease)   . Gout   . Humerus fracture    right  . Hydronephrosis with ureteropelvic junction obstruction   . Hypercalcemia 02/02/12   a. 01/2012: 10.7.  Marland Kitchen Hyperlipidemia   . Hypertension   . Pancreatitis    a. ? Simvastatin related  . Pneumonia age 8 or 56   "I think only once" (06/19/2013)  . Syncope    a. 06/2013  . TIA (transient ischemic attack)    a. 01/2012 - presentation as acute imbalance;  b. 01/2012 carotid U/S w/o signif stenosis;  c. 01/2012 Echo: EF 60-65%, Gr 1 DD.;  d.  Carotid US (06/20/2013): 1-39% bilateral ICA stenosis  . Tubular adenoma polyp of rectum   . Type II  diabetes mellitus ([place])    "diet and exercise controlled at present" (06/19/2013)   Past Surgical History:  Procedure Laterality Date  . APPENDECTOMY    . CARDIAC CATHETERIZATION  2000  . CHOLECYSTECTOMY    . COCHLEAR IMPLANT Right 1990's  . COLONOSCOPY W/ POLYPECTOMY     no polyp 2012  . CORONARY ARTERY BYPASS GRAFT  2000   CABG X5  . INGUINAL HERNIA REPAIR Right 09/15/2015   Procedure: OPEN REPAIR RIGHT INGUINAL HERNIA ;  Surgeon: Jackolyn Confer, MD;  Location: [facility];  Service: General;  Laterality: Right;  . INSERTION OF MESH Right 09/15/2015   Procedure:  INSERTION OF MESH;  Surgeon: Jackolyn Confer, MD;  Location: [facility];  Service: General;  Laterality: Right;  . PARATHYROIDECTOMY  1978  . POLYPECTOMY    . REVERSE SHOULDER ARTHROPLASTY Right 12/30/2014   Procedure: Right shoulder ORIF, Biomet S3 Plate and ZImmer cable system ;  Surgeon: Netta Cedars, MD;  Location: [facility];  Service: Orthopedics;  Laterality: Right;  . right ankle surgery  age 82  . TONSILLECTOMY  1943  . trachestomy as child for throat closing      Allergies  Allergen Reactions  . Simvastatin Other (See Comments)    Possible statin-induced pancreatitis    Allergies as of 10/25/2020      Reactions   Simvastatin Other (See Comments)   Possible statin-induced pancreatitis      Medication List    Notice   This visit is on the same day as an admission, and a visit start time could not be determined. If the visit took place after discharge, manually review the med list with the patient.     Review of Systems  Constitutional: Negative for activity change, appetite change and fever.  HENT: Positive for hearing loss. Negative for congestion and voice change.        R cochlear implant  Eyes: Negative for visual disturbance.  Respiratory: Negative for cough, shortness of breath and wheezing.   Cardiovascular: Negative for leg swelling.  Gastrointestinal: Negative for abdominal pain, constipation, nausea and vomiting.       Last BM yesterday.   Genitourinary: Negative for dysuria, frequency and urgency.       1-2x/night.   Musculoskeletal: Positive for arthralgias, back pain and gait problem.       Lower back pain, left leg pain.   Skin: Negative for color change.  Neurological: Negative for speech difficulty, weakness and headaches.       Memory lapses.   Psychiatric/Behavioral: Negative for behavioral problems and sleep disturbance. The patient is not nervous/anxious.     Immunization History  Administered Date(s) Administered  . Fluad Quad(high Dose 65+)  04/01/2019  . Influenza Split 04/13/2011, 05/02/2012  . Influenza Whole 05/22/2007, 04/12/2008, 05/02/2009, 04/14/2010  . Influenza, High Dose Seasonal PF 04/17/2013, 03/16/2014, 03/05/2016, 03/18/2017  . Influenza,inj,Quad PF,6+ Mos 03/02/2015, 03/18/2018  . Influenza-Unspecified 03/18/2018  . Moderna Sars-Covid-2 Vaccination 06/22/2019, 07/20/2019, 05/02/2020  . PPD Test 01/02/2015  . Pneumococcal Conjugate-13 12/29/2015  . Pneumococcal Polysaccharide-23 04/19/2006  . Tdap 09/04/2016  . Zoster 06/29/2015   Pertinent  Health Maintenance Due  Topic Date Due  . URINE MICROALBUMIN  01/15/2020  . FOOT EXAM  07/20/2020  . INFLUENZA VACCINE  01/16/2021  . HEMOGLOBIN A1C  04/23/2021  . OPHTHALMOLOGY EXAM  08/31/2021  . PNA vac Low Risk Adult  Completed   Fall Risk  10/13/2020 08/23/2020 07/21/2019 01/15/2019 03/18/2017  Falls in the past year? 1 0 0  0 No  Number falls in past yr: 1 0 0 0 -  Injury with Fall? 1 0 - - -  Comment left arm and knee pain - - - -  Risk for fall due to : - No Fall Risks - - -  Follow up Falls evaluation completed Falls evaluation completed - - -  Comment - - - - -   Functional Status Survey:    Vitals:   10/26/20 0830  BP: 119/83  Pulse: (!) 102  Resp: 16  Temp: (!) 97.3 F (36.3 C)  SpO2: 96%  Weight: 171 lb 6.4 oz (77.7 kg)  Height: 5\' 8"  (1.727 m)   Body mass index is 26.06 kg/m. Physical Exam Vitals and nursing note reviewed.  Constitutional:      Appearance: Normal appearance.  HENT:     Head: Normocephalic and atraumatic.     Nose: Nose normal.     Mouth/Throat:     Mouth: Mucous membranes are moist.  Eyes:     Extraocular Movements: Extraocular movements intact.     Conjunctiva/sclera: Conjunctivae normal.     Pupils: Pupils are equal, round, and reactive to light.  Cardiovascular:     Rate and Rhythm: Normal rate and regular rhythm.     Heart sounds: No murmur heard.   Pulmonary:     Effort: Pulmonary effort is normal.      Breath sounds: No wheezing, rhonchi or rales.  Abdominal:     General: Bowel sounds are normal.     Palpations: Abdomen is soft.     Tenderness: There is no abdominal tenderness.  Musculoskeletal:        General: Normal range of motion.     Cervical back: Normal range of motion and neck supple.     Right lower leg: No edema.     Left lower leg: No edema.  Skin:    General: Skin is warm and dry.  Neurological:     General: No focal deficit present.     Mental Status: He is alert and oriented to person, place, and time. Mental status is at baseline.     Motor: No weakness.     Coordination: Coordination normal.     Gait: Gait abnormal.  Psychiatric:        Mood and Affect: Mood normal.        Behavior: Behavior normal.        Thought Content: Thought content normal.     Labs reviewed: Recent Labs    10/21/20 0259 10/22/20 0021 10/22/20 1222  NA 140 140 138  K 3.5 3.6 3.6  CL 108 110 107  CO2 25 24 24   GLUCOSE 121* 110* 121*  BUN 28* 31* 24*  CREATININE 1.46* 1.73* 1.30*  CALCIUM 10.0 9.7 10.0   Recent Labs    01/19/20 1030 08/23/20 1154 10/20/20 1315  AST 18 22 17   ALT 14 19 14   ALKPHOS  --  36* 32*  BILITOT 0.6 0.5 0.4  PROT 6.8 6.9 6.3*  ALBUMIN  --  4.0 3.7   Recent Labs    01/19/20 1030 08/23/20 1154 10/20/20 1315  WBC 5.8 5.6 6.3  NEUTROABS 3,074 3.1 3.9  HGB 13.6 13.0 12.5*  HCT 40.3 38.5* 39.1  MCV 89.4 87.6 92.7  PLT 231 264.0 265   Lab Results  Component Value Date   TSH 2.21 07/03/2016   Lab Results  Component Value Date   HGBA1C 7.1 (H) 10/21/2020   Lab Results  Component Value Date   CHOL 135 10/21/2020   HDL 31 (L) 10/21/2020   LDLCALC 75 10/21/2020   LDLDIRECT 96.0 08/23/2020   TRIG 145 10/21/2020   CHOLHDL 4.4 10/21/2020    Significant Diagnostic Results in last 30 days:  CT ANGIO HEAD NECK W WO CM  Result Date: 10/21/2020 CLINICAL DATA:  Stroke/TIA, assess intracranial arteries EXAM: CT ANGIOGRAPHY HEAD AND NECK  TECHNIQUE: Multidetector CT imaging of the head and neck was performed using the standard protocol during bolus administration of intravenous contrast. Multiplanar CT image reconstructions and MIPs were obtained to evaluate the vascular anatomy. Carotid stenosis measurements (when applicable) are obtained utilizing NASCET criteria, using the distal internal carotid diameter as the denominator. CONTRAST:  65mL OMNIPAQUE IOHEXOL 350 MG/ML SOLN COMPARISON:  None. FINDINGS: CT HEAD FINDINGS Brain: There is no mass, hemorrhage or extra-axial collection. The size and configuration of the ventricles and extra-axial CSF spaces are normal. There is an old right basal ganglia lacunar infarct. Bone anchored hearing aid causes extensive streak artifact. The brain parenchyma is normal. Skull: The visualized skull base, calvarium and extracranial soft tissues are normal. Sinuses/Orbits: No fluid levels or advanced mucosal thickening of the visualized paranasal sinuses. No mastoid or middle ear effusion. The orbits are normal. CTA NECK FINDINGS SKELETON: There is no bony spinal canal stenosis. No lytic or blastic lesion. OTHER NECK: Normal pharynx, larynx and major salivary glands. No cervical lymphadenopathy. Unremarkable thyroid gland. UPPER CHEST: No pneumothorax or pleural effusion. No nodules or masses. AORTIC ARCH: There is calcific atherosclerosis of the aortic arch. There is no aneurysm, dissection or hemodynamically significant stenosis of the visualized portion of the aorta. Conventional 3 vessel aortic branching pattern. The visualized proximal subclavian arteries are widely patent. RIGHT CAROTID SYSTEM: Normal without aneurysm, dissection or stenosis. LEFT CAROTID SYSTEM: Normal without aneurysm, dissection or stenosis. VERTEBRAL ARTERIES: Codominant configuration. Severe stenosis of the right vertebral artery origin (12:161, 162). There is no dissection, occlusion or flow-limiting stenosis to the skull base (V1-V3  segments). CTA HEAD FINDINGS POSTERIOR CIRCULATION: --Vertebral arteries: Normal V4 segments. --Inferior cerebellar arteries: Normal. --Basilar artery: Normal. --Superior cerebellar arteries: Normal. --Posterior cerebral arteries (PCA): Mild narrowing of the right P2 segment. ANTERIOR CIRCULATION: --Intracranial internal carotid arteries: Normal. --Anterior cerebral arteries (ACA): Normal. Both A1 segments are present. Patent anterior communicating artery (a-comm). --Middle cerebral arteries (MCA): Normal. VENOUS SINUSES: As permitted by contrast timing, patent. ANATOMIC VARIANTS: None Review of the MIP images confirms the above findings. IMPRESSION: 1. No intracranial arterial occlusion or high-grade stenosis. 2. 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 Aortic Atherosclerosis (ICD10-I70.0). Electronically Signed   By: Ulyses Jarred M.D.   On: 10/21/2020 20:13   CT Head Wo Contrast  Result Date: 10/20/2020 CLINICAL DATA:  Neuro deficit.  Acute stroke suspected. EXAM: CT HEAD WITHOUT CONTRAST TECHNIQUE: Contiguous axial images were obtained from the base of the skull through the vertex without intravenous contrast. COMPARISON:  12/28/2014 FINDINGS: Brain: There is no evidence for acute hemorrhage, hydrocephalus, mass lesion, or abnormal extra-axial fluid collection. No definite CT evidence for acute infarction. Portions of the right temporal and parietal lobes are obscured by beam hardening artifact from right cochlear implant hardware. Diffuse loss of parenchymal volume is consistent with atrophy. Patchy low attenuation in the deep hemispheric and periventricular white matter is nonspecific, but likely reflects chronic microvascular ischemic demyelination. Vascular: No hyperdense vessel or unexpected calcification. Skull: No evidence for fracture. No worrisome lytic or sclerotic lesion. Sinuses/Orbits: Right maxillary sinus is  opacified. Opacification of anterior right ethmoid air cells noted.  Frontal sinuses are clear. Surgical changes noted right your region consistent with cochlear implant. Other: None. IMPRESSION: 1. Stable exam.  No acute intracranial abnormality. 2. Atrophy with chronic small vessel white matter disease. 3. Right cochlear implant. Electronically Signed   By: Misty Stanley M.D.   On: 10/20/2020 14:37   ECHOCARDIOGRAM COMPLETE  Result Date: 10/21/2020    ECHOCARDIOGRAM REPORT   Patient Name:   Juan [name] Date of Exam: 10/21/2020 Medical Rec #:  [id number]    Height:       68.0 in Accession #:    [id number]   Weight:       174.8 lb Date of Birth:  1935-10-27    BSA:          1.930 m Patient Age:    44 years     BP:           137/84 mmHg Patient Gender: M            HR:           67 bpm. Exam Location:  Inpatient Procedure: 2D Echo, Color Doppler and Cardiac Doppler Indications:    TIA G45.9  History:        Patient has prior history of Echocardiogram examinations, most                 recent 06/19/2013. CAD, TIA, Signs/Symptoms:Syncope; Risk                 Factors:Diabetes, Hypertension and Dyslipidemia.  Sonographer:    Bernadene Person RDCS Referring Phys: [id number] [place]  1. Left ventricular ejection fraction, by estimation, is 60 to 65%. The left ventricle has normal function. The left ventricle has no regional wall motion abnormalities. Left ventricular diastolic parameters were normal.  2. Right ventricular systolic function is normal. The right ventricular size is normal.  3. The IAS appears hypermobile. Borderline aneurysmal. No definitive PFO.  4. The mitral valve is grossly normal. Trivial mitral valve regurgitation. No evidence of mitral stenosis.  5. The aortic valve is tricuspid. There is mild calcification of the aortic valve. There is mild thickening of the aortic valve. Aortic valve regurgitation is not visualized. Mild aortic valve sclerosis is present, with no evidence of aortic valve stenosis.  6. The inferior vena cava is normal in size with greater  than 50% respiratory variability, suggesting right atrial pressure of 3 mmHg. Conclusion(s)/Recommendation(s): No intracardiac source of embolism detected on this transthoracic study. A transesophageal echocardiogram is recommended to exclude cardiac source of embolism if clinically indicated. FINDINGS  Left Ventricle: Left ventricular ejection fraction, by estimation, is 60 to 65%. The left ventricle has normal function. The left ventricle has no regional wall motion abnormalities. The left ventricular internal cavity size was normal in size. There is  no left ventricular hypertrophy. Left ventricular diastolic parameters were normal. Right Ventricle: The right ventricular size is normal. No increase in right ventricular wall thickness. Right ventricular systolic function is normal. Left Atrium: Left atrial size was normal in size. Right Atrium: Right atrial size was normal in size. Pericardium: Trivial pericardial effusion is present. Presence of pericardial fat pad. Mitral Valve: The mitral valve is grossly normal. Trivial mitral valve regurgitation. No evidence of mitral valve stenosis. Tricuspid Valve: The tricuspid valve is grossly normal. Tricuspid valve regurgitation is trivial. No evidence of tricuspid stenosis. Aortic Valve: The aortic valve is tricuspid. There is mild calcification of  the aortic valve. There is mild thickening of the aortic valve. Aortic valve regurgitation is not visualized. Mild aortic valve sclerosis is present, with no evidence of aortic valve stenosis. Pulmonic Valve: The pulmonic valve was grossly normal. Pulmonic valve regurgitation is not visualized. No evidence of pulmonic stenosis. Aorta: The aortic root and ascending aorta are structurally normal, with no evidence of dilitation. Venous: The inferior vena cava is normal in size with greater than 50% respiratory variability, suggesting right atrial pressure of 3 mmHg. IAS/Shunts: There is redundancy of the interatrial septum. The  atrial septum is grossly normal.  LEFT VENTRICLE PLAX 2D LVIDd:         4.50 cm  Diastology LVIDs:         3.00 cm  LV e' medial:    8.27 cm/s LV PW:         1.00 cm  LV E/e' medial:  6.0 LV IVS:        1.00 cm  LV e' lateral:   9.32 cm/s LVOT diam:     2.20 cm  LV E/e' lateral: 5.4 LV SV:         80 LV SV Index:   42 LVOT Area:     3.80 cm  RIGHT VENTRICLE RV S prime:     12.50 cm/s TAPSE (M-mode): 1.9 cm LEFT ATRIUM             Index       RIGHT ATRIUM           Index LA diam:        3.80 cm 1.97 cm/m  RA Area:     13.70 cm LA Vol (A2C):   34.3 ml 17.77 ml/m RA Volume:   27.50 ml  14.25 ml/m LA Vol (A4C):   24.0 ml 12.43 ml/m LA Biplane Vol: 30.9 ml 16.01 ml/m  AORTIC VALVE LVOT Vmax:   124.00 cm/s LVOT Vmean:  80.700 cm/s LVOT VTI:    0.211 m  AORTA Ao Root diam: 3.50 cm Ao Asc diam:  3.90 cm MITRAL VALVE MV Area (PHT): 2.05 cm    SHUNTS MV Decel Time: 370 msec    Systemic VTI:  0.21 m MV E velocity: 49.90 cm/s  Systemic Diam: 2.20 cm MV A velocity: 70.60 cm/s MV E/A ratio:  0.71 Eleonore Chiquito MD Electronically signed by Eleonore Chiquito MD Signature Date/Time: 10/21/2020/2:50:55 PM    Final     Assessment/Plan TIA (transient ischemic attack) Hospitalized 10/20/20-10/24/20 for TIA/CVA,  placed on Plavix 75mg , ASA 81mg  x 3 weeks then ASA 81mg  alone, f/u Neurology. 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 Small lacunar infarct.    History of gout  takes Allopurinol, prn Colchicine,  venous US was negative for DVT  Essential hypertension, benign Controlled, off meds.   CKD (chronic kidney disease) Bun/creat 24/1.30 10/22/20  Shuffling gait , shuffling.   Diabetes mellitus without complication ([place]) Hgb [id number] 7.1 10/21/20, takes Metformin  Recurrent falls SNF [facility] for therapy.   Coronary atherosclerosis Echo no intracardiac source of embolism   Mixed hyperlipidemia on Pravachol, Fenofibric acid,  LDL 75 10/21/20   Memory deficit F/u  neurology  HOH (hard of hearing) R cochlear implant.   Benign prostatic hyperplasia with urinary obstruction  takes Finasteride,       Family/ staff Communication: plan of care reviewed with the patient and charge nurse.   Labs/tests ordered:  none  Time spend 35 minutes.

## 2020-10-26 NOTE — Assessment & Plan Note (Signed)
SNF [facility] for therapy.  

## 2020-10-26 NOTE — Assessment & Plan Note (Signed)
on Pravachol, Fenofibric acid,  LDL 75 10/21/20 

## 2020-10-26 NOTE — Assessment & Plan Note (Signed)
takes Finasteride, 

## 2020-10-27 ENCOUNTER — Non-Acute Institutional Stay (SKILLED_NURSING_FACILITY): Payer: Federal, State, Local not specified - PPO | Admitting: Internal Medicine

## 2020-10-27 ENCOUNTER — Telehealth: Payer: Self-pay

## 2020-10-27 ENCOUNTER — Encounter: Payer: Self-pay | Admitting: Internal Medicine

## 2020-10-27 DIAGNOSIS — G459 Transient cerebral ischemic attack, unspecified: Secondary | ICD-10-CM

## 2020-10-27 DIAGNOSIS — N1831 Chronic kidney disease, stage 3a: Secondary | ICD-10-CM

## 2020-10-27 DIAGNOSIS — E119 Type 2 diabetes mellitus without complications: Secondary | ICD-10-CM

## 2020-10-27 DIAGNOSIS — Z8739 Personal history of other diseases of the musculoskeletal system and connective tissue: Secondary | ICD-10-CM | POA: Diagnosis not present

## 2020-10-27 DIAGNOSIS — R296 Repeated falls: Secondary | ICD-10-CM

## 2020-10-27 DIAGNOSIS — I10 Essential (primary) hypertension: Secondary | ICD-10-CM | POA: Diagnosis not present

## 2020-10-27 DIAGNOSIS — N138 Other obstructive and reflux uropathy: Secondary | ICD-10-CM

## 2020-10-27 DIAGNOSIS — E782 Mixed hyperlipidemia: Secondary | ICD-10-CM

## 2020-10-27 DIAGNOSIS — H9191 Unspecified hearing loss, right ear: Secondary | ICD-10-CM

## 2020-10-27 DIAGNOSIS — N401 Enlarged prostate with lower urinary tract symptoms: Secondary | ICD-10-CM

## 2020-10-27 NOTE — Progress Notes (Signed)
Provider:   Location:  [facility] Room Number: 40 Place of Service:  SNF (31)  PCP: Binnie Rail, MD Patient Care Team: Binnie Rail, MD as PCP - General (Internal Medicine) [name], MD as PCP - Cardiology (Cardiology)  Extended Emergency Contact Information Primary Emergency Contact: [place], Luckey [place] of [place] Phone: [phone] Mobile Phone: [phone] Relation: Daughter Secondary Emergency Contact: [name],[name] Address: [address]           [place], [place] [address] [name] of [place] Phone: [phone] Mobile Phone: [phone] Relation: Daughter  Code Status:  Goals of Care: Advanced Directive information Advanced Directives 10/26/2020  Does Patient Have a Medical Advance Directive? Yes  Type of Paramedic of [place];Living will  Does patient want to make changes to medical advance directive? No - Patient declined  Copy of [place] in Chart? Yes - validated most recent copy scanned in chart (See row information)  Would patient like information on creating a medical advance directive? -      Chief Complaint  Patient presents with  . New Admit To SNF    Admission to SNF    HPI: Patient is a 85 y.o. male seen today for admission to SNF for therapy  Patient was admitted in the hospital from 5/5-5/9 for Possible CVA  Patient has a history of CAD, gout, HLD, hypertension and diabetes.  Patient was admitted in the hospital after he had a fall in a grocery store and had acute onset of confusion.  Was found to have mild slurred speech and mild weakness of the left side. CT of the head showed no acute change.  Showed atrophy with chronic small vessel disease Unable to do the MRI due to cochlear implant CTA showed severe stenosis of right vertebral artery Was seen by neurology who recommended dual antiplatelet therapy for 3 weeks and then  aspirin.  Patient already on statin. Echo was negative for any source of Embolism  He is doing well.Only c/o Pain Or discomfort in left leg Takes Tylenol PRN Able to walk now but stays unstable and needs Mild assist Spech more Clearer Lives by himself in [facility] apartment   Past Medical History:  Diagnosis Date  . Cancer ([place])    skin  . CKD (chronic kidney disease) 06/24/2013  . Coronary artery disease    a. s/p CABG 2000; b. myoview 9/08: inf-septal scar with mild peri-infarct ischemia (low risk);  c.  Echo 8/13: mild focal basal septal hypertrophy, Gr 1 DD, mild LAE;  d. Lexiscan Myoview (06/20/2013): No reversible ischemia, inferior and septal infarct, inferior and septal hypokinesis, EF 60%;  e.  Echo (06/20/2011): EF 50-55%, normal wall motion, grade 1 diastolic dysfunction, mild LAE  . Diverticular disease   . Duodenitis   . Elevated PSA   . GERD (gastroesophageal reflux disease)   . Gout   . Humerus fracture    right  . Hydronephrosis with ureteropelvic junction obstruction   . Hypercalcemia 02/02/12   a. 01/2012: 10.7.  Marland Kitchen Hyperlipidemia   . Hypertension   . Pancreatitis    a. ? Simvastatin related  . Pneumonia age 70 or 20   "I think only once" (06/19/2013)  . Syncope    a. 06/2013  . TIA (transient ischemic attack)    a. 01/2012 - presentation as acute imbalance;  b. 01/2012 carotid U/S w/o signif  stenosis;  c. 01/2012 Echo: EF 60-65%, Gr 1 DD.;  d.  Carotid US (06/20/2013): 1-39% bilateral ICA stenosis  . Tubular adenoma polyp of rectum   . Type II diabetes mellitus ([place])    "diet and exercise controlled at present" (06/19/2013)   Past Surgical History:  Procedure Laterality Date  . APPENDECTOMY    . CARDIAC CATHETERIZATION  2000  . CHOLECYSTECTOMY    . COCHLEAR IMPLANT Right 1990's  . COLONOSCOPY W/ POLYPECTOMY     no polyp 2012  . CORONARY ARTERY BYPASS GRAFT  2000   CABG X5  . INGUINAL HERNIA REPAIR Right 09/15/2015   Procedure: OPEN REPAIR RIGHT INGUINAL HERNIA ;   Surgeon: Jackolyn Confer, MD;  Location: [facility];  Service: General;  Laterality: Right;  . INSERTION OF MESH Right 09/15/2015   Procedure: INSERTION OF MESH;  Surgeon: Jackolyn Confer, MD;  Location: [facility];  Service: General;  Laterality: Right;  . PARATHYROIDECTOMY  1978  . POLYPECTOMY    . REVERSE SHOULDER ARTHROPLASTY Right 12/30/2014   Procedure: Right shoulder ORIF, Biomet S3 Plate and ZImmer cable system ;  Surgeon: Netta Cedars, MD;  Location: [facility];  Service: Orthopedics;  Laterality: Right;  . right ankle surgery  age 28  . TONSILLECTOMY  1943  . trachestomy as child for throat closing      reports that he has quit smoking. His smoking use included cigars. He quit after 0.00 years of use. He has never used smokeless tobacco. He reports current alcohol use. He reports that he does not use drugs. Social History   Socioeconomic History  . Marital status: Widowed    Spouse name: Not on file  . Number of children: Not on file  . Years of education: Not on file  . Highest education level: Not on file  Occupational History  . Occupation: retired    Fish farm manager: RETIRED  Tobacco Use  . Smoking status: Former Smoker    Years: 0.00    Types: Cigars  . Smokeless tobacco: Never Used  . Tobacco comment: 06/19/2013 "rare cigar"  Substance and Sexual Activity  . Alcohol use: Yes    Comment: 12/29/14 "might have a drink once/month"  . Drug use: No  . Sexual activity: Not Currently    Birth control/protection: Injection  Other Topics Concern  . Not on file  Social History Narrative   Does get regular exercise   Social Determinants of Health   Financial Resource Strain: Not on file  Food Insecurity: Not on file  Transportation Needs: Not on file  Physical Activity: Not on file  Stress: Not on file  Social Connections: Not on file  Intimate Partner Violence: Not on file    Functional Status Survey:    Family History  Problem Relation Age of Onset  . Melanoma Father   . Heart  attack Father         in 47s  . Diabetes Neg Hx     Health Maintenance  Topic Date Due  . URINE MICROALBUMIN  01/15/2020  . FOOT EXAM  07/20/2020  . INFLUENZA VACCINE  01/16/2021  . HEMOGLOBIN A1C  04/23/2021  . OPHTHALMOLOGY EXAM  08/31/2021  . TETANUS/TDAP  09/05/2026  . COVID-19 Vaccine  Completed  . PNA vac Low Risk Adult  Completed  . HPV VACCINES  Aged Out    Allergies  Allergen Reactions  . Simvastatin Other (See Comments)    Possible statin-induced pancreatitis    Outpatient Encounter Medications as of 10/27/2020  Medication Sig  . acetaminophen (TYLENOL) 500 MG tablet Take 500-1,000 mg by mouth every 6 (six) hours as needed for mild pain (or headaches).  Marland Kitchen allopurinol (ZYLOPRIM) 100 MG tablet TAKE 1 TABLET(100 MG) BY MOUTH DAILY (Patient taking differently: Take 100 mg by mouth in the morning.)  . aspirin EC 81 MG tablet Take 81 mg by mouth daily.  . blood glucose meter kit and supplies KIT Dispense based on patient and insurance preference. Use up to four times daily as directed. (FOR ICD-9 250.00, 250.01).  . Choline Fenofibrate (FENOFIBRIC ACID) 135 MG CPDR TAKE 1 CAPSULE BY MOUTH DAILY (Patient taking differently: Take 135 mg by mouth daily.)  . clopidogrel (PLAVIX) 75 MG tablet Take 1 tablet (75 mg total) by mouth daily. (Patient taking differently: Take 75 mg by mouth daily. End date: 11/14/2020)  . colchicine 0.6 MG tablet Take 0.6 mg by mouth daily as needed (as directed for gout flares).  . finasteride (PROSCAR) 5 MG tablet Take 5 mg by mouth every evening.  . metFORMIN (GLUCOPHAGE) 500 MG tablet TAKE 1 TABLET(500 MG) BY MOUTH DAILY WITH BREAKFAST (Patient taking differently: Take 500 mg by mouth daily with breakfast.)  . pravastatin (PRAVACHOL) 20 MG tablet Take 1 tablet (20 mg total) by mouth 3 (three) times a week. (Patient taking differently: Take 20 mg by mouth every Monday, Wednesday, and Friday.)  . tuberculin (TUBERSOL) 5 UNIT/0.1ML injection Inject 0.1  mLs into the skin once. Every 14 days  . zinc oxide 20 % ointment Apply 1 application topically as needed for irritation.   No facility-administered encounter medications on file as of 10/27/2020.    Review of Systems  Review of Systems  Constitutional: Negative for activity change, appetite change, chills, diaphoresis, fatigue and fever.  HENT: Negative for mouth sores, postnasal drip, rhinorrhea, sinus pain and sore throat.   Respiratory: Negative for apnea, cough, chest tightness, shortness of breath and wheezing.   Cardiovascular: Negative for chest pain, palpitations and leg swelling.  Gastrointestinal: Negative for abdominal distention, abdominal pain, constipation, diarrhea, nausea and vomiting.  Genitourinary: Negative for dysuria and frequency.  Musculoskeletal: Negative for arthralgias, joint swelling and myalgias.  Skin: Negative for rash.  Neurological: Negative for dizziness, syncope, weakness, light-headedness and numbness.  Psychiatric/Behavioral: Negative for behavioral problems, confusion and sleep disturbance.     Vitals:   10/27/20 1000  BP: 127/88  Pulse: 98  Resp: 16  Temp: 97.7 F (36.5 C)  SpO2: 96%  Weight: 171 lb 6.4 oz (77.7 kg)  Height: _0  (1.753 m)   Body mass index is 25.31 kg/m. Physical Exam  Constitutional: Oriented to person, place, and time. Well-developed and well-nourished.  HENT:  Head: Normocephalic.  Mouth/Throat: Oropharynx is clear and moist.  Eyes: Pupils are equal, round, and reactive to light.  Neck: Neck supple.  Cardiovascular: Normal rate and normal heart sounds.  No murmur heard. Pulmonary/Chest: Effort normal and breath sounds normal. No respiratory distress. No wheezes. She has no rales.  Abdominal: Soft. Bowel sounds are normal. No distension. There is no tenderness. There is no rebound.  Musculoskeletal: No edema.  Lymphadenopathy: none Neurological: Alert and oriented to person, place, and time.  Mild Slurred  speech. No Facial Droop No Focal Deficits Gait Mildily unstable Skin: Skin is warm and dry.  Psychiatric: Normal mood and affect. Behavior is normal. Thought content normal.    Labs reviewed: Basic Metabolic Panel: Recent Labs    10/21/20 0259 10/22/20 0021 10/22/20 1222  NA 140 140  138  K 3.5 3.6 3.6  CL 108 110 107  CO2 _0 GLUCOSE 121* 110* 121*  BUN 28* 31* 24*  CREATININE 1.46* 1.73* 1.30*  CALCIUM 10.0 9.7 10.0   Liver Function Tests: Recent Labs    01/19/20 1030 08/23/20 1154 10/20/20 1315  AST _1 ALT _2 ALKPHOS  --  36* 32*  BILITOT 0.6 0.5 0.4  PROT 6.8 6.9 6.3*  ALBUMIN  --  4.0 3.7   No results for input(s): LIPASE, AMYLASE in the last 8760 hours. No results for input(s): AMMONIA in the last 8760 hours. CBC: Recent Labs    01/19/20 1030 08/23/20 1154 10/20/20 1315  WBC 5.8 5.6 6.3  NEUTROABS 3,074 3.1 3.9  HGB 13.6 13.0 12.5*  HCT 40.3 38.5* 39.1  MCV 89.4 87.6 92.7  PLT 231 264.0 265   Cardiac Enzymes: No results for input(s): CKTOTAL, CKMB, CKMBINDEX, TROPONINI in the last 8760 hours. BNP: Invalid input(s): POCBNP Lab Results  Component Value Date   HGBA1C 7.1 (H) 10/21/2020   Lab Results  Component Value Date   TSH 2.21 07/03/2016   No results found for: VITAMINB12 No results found for: FOLATE No results found for: IRON, TIBC, FERRITIN  Imaging and Procedures obtained prior to SNF admission: CT ANGIO HEAD NECK W WO CM  Result Date: 10/21/2020 CLINICAL DATA:  Stroke/TIA, assess intracranial arteries EXAM: CT ANGIOGRAPHY HEAD AND NECK TECHNIQUE: Multidetector CT imaging of the head and neck was performed using the standard protocol during bolus administration of intravenous contrast. Multiplanar CT image reconstructions and MIPs were obtained to evaluate the vascular anatomy. Carotid stenosis measurements (when applicable) are obtained utilizing NASCET criteria, using the distal internal carotid diameter as the  denominator. CONTRAST:  71m OMNIPAQUE IOHEXOL 350 MG/ML SOLN COMPARISON:  None. FINDINGS: CT HEAD FINDINGS Brain: There is no mass, hemorrhage or extra-axial collection. The size and configuration of the ventricles and extra-axial CSF spaces are normal. There is an old right basal ganglia lacunar infarct. Bone anchored hearing aid causes extensive streak artifact. The brain parenchyma is normal. Skull: The visualized skull base, calvarium and extracranial soft tissues are normal. Sinuses/Orbits: No fluid levels or advanced mucosal thickening of the visualized paranasal sinuses. No mastoid or middle ear effusion. The orbits are normal. CTA NECK FINDINGS SKELETON: There is no bony spinal canal stenosis. No lytic or blastic lesion. OTHER NECK: Normal pharynx, larynx and major salivary glands. No cervical lymphadenopathy. Unremarkable thyroid gland. UPPER CHEST: No pneumothorax or pleural effusion. No nodules or masses. AORTIC ARCH: There is calcific atherosclerosis of the aortic arch. There is no aneurysm, dissection or hemodynamically significant stenosis of the visualized portion of the aorta. Conventional 3 vessel aortic branching pattern. The visualized proximal subclavian arteries are widely patent. RIGHT CAROTID SYSTEM: Normal without aneurysm, dissection or stenosis. LEFT CAROTID SYSTEM: Normal without aneurysm, dissection or stenosis. VERTEBRAL ARTERIES: Codominant configuration. Severe stenosis of the right vertebral artery origin (12:161, 162). There is no dissection, occlusion or flow-limiting stenosis to the skull base (V1-V3 segments). CTA HEAD FINDINGS POSTERIOR CIRCULATION: --Vertebral arteries: Normal V4 segments. --Inferior cerebellar arteries: Normal. --Basilar artery: Normal. --Superior cerebellar arteries: Normal. --Posterior cerebral arteries (PCA): Mild narrowing of the right P2 segment. ANTERIOR CIRCULATION: --Intracranial internal carotid arteries: Normal. --Anterior cerebral arteries (ACA):  Normal. Both A1 segments are present. Patent anterior communicating artery (a-comm). --Middle cerebral arteries (MCA): Normal. VENOUS SINUSES: As permitted by contrast timing, patent. ANATOMIC VARIANTS: None Review of the MIP  images confirms the above findings. IMPRESSION: 1. No intracranial arterial occlusion or high-grade stenosis. 2. 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 Aortic Atherosclerosis (ICD10-I70.0). Electronically Signed   By: Ulyses Jarred M.D.   On: 10/21/2020 20:13   CT Head Wo Contrast  Result Date: 10/20/2020 CLINICAL DATA:  Neuro deficit.  Acute stroke suspected. EXAM: CT HEAD WITHOUT CONTRAST TECHNIQUE: Contiguous axial images were obtained from the base of the skull through the vertex without intravenous contrast. COMPARISON:  12/28/2014 FINDINGS: Brain: There is no evidence for acute hemorrhage, hydrocephalus, mass lesion, or abnormal extra-axial fluid collection. No definite CT evidence for acute infarction. Portions of the right temporal and parietal lobes are obscured by beam hardening artifact from right cochlear implant hardware. Diffuse loss of parenchymal volume is consistent with atrophy. Patchy low attenuation in the deep hemispheric and periventricular white matter is nonspecific, but likely reflects chronic microvascular ischemic demyelination. Vascular: No hyperdense vessel or unexpected calcification. Skull: No evidence for fracture. No worrisome lytic or sclerotic lesion. Sinuses/Orbits: Right maxillary sinus is opacified. Opacification of anterior right ethmoid air cells noted. Frontal sinuses are clear. Surgical changes noted right your region consistent with cochlear implant. Other: None. IMPRESSION: 1. Stable exam.  No acute intracranial abnormality. 2. Atrophy with chronic small vessel white matter disease. 3. Right cochlear implant. Electronically Signed   By: Misty Stanley M.D.   On: 10/20/2020 14:37   ECHOCARDIOGRAM COMPLETE  Result Date: 10/21/2020     ECHOCARDIOGRAM REPORT   Patient Name:   AZTLAN COLL Date of Exam: 10/21/2020 Medical Rec #:  [id number]    Height:       68.0 in Accession #:    [id number]   Weight:       174.8 lb Date of Birth:  17-May-1936    BSA:          1.930 m Patient Age:    89 years     BP:           137/84 mmHg Patient Gender: M            HR:           67 bpm. Exam Location:  Inpatient Procedure: 2D Echo, Color Doppler and Cardiac Doppler Indications:    TIA G45.9  History:        Patient has prior history of Echocardiogram examinations, most                 recent 06/19/2013. CAD, TIA, Signs/Symptoms:Syncope; Risk                 Factors:Diabetes, Hypertension and Dyslipidemia.  Sonographer:    Bernadene Person RDCS Referring Phys: [id number] [place]  1. Left ventricular ejection fraction, by estimation, is 60 to 65%. The left ventricle has normal function. The left ventricle has no regional wall motion abnormalities. Left ventricular diastolic parameters were normal.  2. Right ventricular systolic function is normal. The right ventricular size is normal.  3. The IAS appears hypermobile. Borderline aneurysmal. No definitive PFO.  4. The mitral valve is grossly normal. Trivial mitral valve regurgitation. No evidence of mitral stenosis.  5. The aortic valve is tricuspid. There is mild calcification of the aortic valve. There is mild thickening of the aortic valve. Aortic valve regurgitation is not visualized. Mild aortic valve sclerosis is present, with no evidence of aortic valve stenosis.  6. The inferior vena cava is normal in size with greater than 50% respiratory variability, suggesting right atrial  pressure of 3 mmHg. Conclusion(s)/Recommendation(s): No intracardiac source of embolism detected on this transthoracic study. A transesophageal echocardiogram is recommended to exclude cardiac source of embolism if clinically indicated. FINDINGS  Left Ventricle: Left ventricular ejection fraction, by estimation, is 60 to 65%. The  left ventricle has normal function. The left ventricle has no regional wall motion abnormalities. The left ventricular internal cavity size was normal in size. There is  no left ventricular hypertrophy. Left ventricular diastolic parameters were normal. Right Ventricle: The right ventricular size is normal. No increase in right ventricular wall thickness. Right ventricular systolic function is normal. Left Atrium: Left atrial size was normal in size. Right Atrium: Right atrial size was normal in size. Pericardium: Trivial pericardial effusion is present. Presence of pericardial fat pad. Mitral Valve: The mitral valve is grossly normal. Trivial mitral valve regurgitation. No evidence of mitral valve stenosis. Tricuspid Valve: The tricuspid valve is grossly normal. Tricuspid valve regurgitation is trivial. No evidence of tricuspid stenosis. Aortic Valve: The aortic valve is tricuspid. There is mild calcification of the aortic valve. There is mild thickening of the aortic valve. Aortic valve regurgitation is not visualized. Mild aortic valve sclerosis is present, with no evidence of aortic valve stenosis. Pulmonic Valve: The pulmonic valve was grossly normal. Pulmonic valve regurgitation is not visualized. No evidence of pulmonic stenosis. Aorta: The aortic root and ascending aorta are structurally normal, with no evidence of dilitation. Venous: The inferior vena cava is normal in size with greater than 50% respiratory variability, suggesting right atrial pressure of 3 mmHg. IAS/Shunts: There is redundancy of the interatrial septum. The atrial septum is grossly normal.  LEFT VENTRICLE PLAX 2D LVIDd:         4.50 cm  Diastology LVIDs:         3.00 cm  LV e' medial:    8.27 cm/s LV PW:         1.00 cm  LV E/e' medial:  6.0 LV IVS:        1.00 cm  LV e' lateral:   9.32 cm/s LVOT diam:     2.20 cm  LV E/e' lateral: 5.4 LV SV:         80 LV SV Index:   42 LVOT Area:     3.80 cm  RIGHT VENTRICLE RV S prime:     12.50 cm/s  TAPSE (M-mode): 1.9 cm LEFT ATRIUM             Index       RIGHT ATRIUM           Index LA diam:        3.80 cm 1.97 cm/m  RA Area:     13.70 cm LA Vol (A2C):   34.3 ml 17.77 ml/m RA Volume:   27.50 ml  14.25 ml/m LA Vol (A4C):   24.0 ml 12.43 ml/m LA Biplane Vol: 30.9 ml 16.01 ml/m  AORTIC VALVE LVOT Vmax:   124.00 cm/s LVOT Vmean:  80.700 cm/s LVOT VTI:    0.211 m  AORTA Ao Root diam: 3.50 cm Ao Asc diam:  3.90 cm MITRAL VALVE MV Area (PHT): 2.05 cm    SHUNTS MV Decel Time: 370 msec    Systemic VTI:  0.21 m MV E velocity: 49.90 cm/s  Systemic Diam: 2.20 cm MV A velocity: 70.60 cm/s MV E/A ratio:  0.71 Eleonore Chiquito MD Electronically signed by Eleonore Chiquito MD Signature Date/Time: 10/21/2020/2:50:55 PM    Final     Assessment/Plan  TIA (transient ischemic attack)/ Lacunar Infarct DPAT for 3 weeks Followed by Aspirin Follow up with Neurology Statin and BP controlled  Essential hypertension, benign Controlled Not on any meds Stage 3a chronic kidney disease (HCC) Creat stable History of gout On Allopurinal Mixed hyperlipidemia Continue Statin and Fenofibrate Diabetes mellitus without complication (HCC) On Metformin  A1C good Levels Recurrent falls Is working with therapy Hearing loss of right ear, unspecified hearing loss type S/p Cochlear Implant Benign prostatic hyperplasia with urinary obstruction On Proscar   Family/ staff Communication:   Labs/tests ordered:

## 2020-10-27 NOTE — Telephone Encounter (Signed)
Pt d/c'd to rehab facility within his nursing home. Daughter states that pt has appt on 5/17 with Dr Quay Burow, however, she is going to try to "talk him into going" Daughter has no quest/concerns at this time.

## 2020-11-01 ENCOUNTER — Ambulatory Visit: Payer: Federal, State, Local not specified - PPO | Admitting: Internal Medicine

## 2020-11-18 ENCOUNTER — Other Ambulatory Visit: Payer: Self-pay | Admitting: Internal Medicine

## 2020-11-18 ENCOUNTER — Telehealth: Payer: Self-pay | Admitting: Internal Medicine

## 2020-11-18 NOTE — Telephone Encounter (Signed)
I talked to Mr Juan [name] his daughter and she is Pharmacist According to her he was on baby aspirin before this episode of ? TIA/Stroke She is worried that if we take him off Plavix he can have another episode I would discontinue his Aspirin and start on Plavix He has appointment to see Dr Leta Baptist in few weeks. She will discuss this with him Change made in his Med list.

## 2020-11-24 ENCOUNTER — Encounter: Payer: Self-pay | Admitting: Orthopedic Surgery

## 2020-11-24 ENCOUNTER — Non-Acute Institutional Stay (SKILLED_NURSING_FACILITY): Payer: Federal, State, Local not specified - PPO | Admitting: Orthopedic Surgery

## 2020-11-24 DIAGNOSIS — E1122 Type 2 diabetes mellitus with diabetic chronic kidney disease: Secondary | ICD-10-CM

## 2020-11-24 DIAGNOSIS — I10 Essential (primary) hypertension: Secondary | ICD-10-CM

## 2020-11-24 DIAGNOSIS — I2510 Atherosclerotic heart disease of native coronary artery without angina pectoris: Secondary | ICD-10-CM | POA: Diagnosis not present

## 2020-11-24 DIAGNOSIS — N1831 Chronic kidney disease, stage 3a: Secondary | ICD-10-CM

## 2020-11-24 DIAGNOSIS — E782 Mixed hyperlipidemia: Secondary | ICD-10-CM

## 2020-11-24 DIAGNOSIS — N138 Other obstructive and reflux uropathy: Secondary | ICD-10-CM

## 2020-11-24 DIAGNOSIS — N401 Enlarged prostate with lower urinary tract symptoms: Secondary | ICD-10-CM

## 2020-11-24 DIAGNOSIS — H9191 Unspecified hearing loss, right ear: Secondary | ICD-10-CM

## 2020-11-24 DIAGNOSIS — Z8739 Personal history of other diseases of the musculoskeletal system and connective tissue: Secondary | ICD-10-CM

## 2020-11-24 DIAGNOSIS — G459 Transient cerebral ischemic attack, unspecified: Secondary | ICD-10-CM

## 2020-11-24 DIAGNOSIS — R296 Repeated falls: Secondary | ICD-10-CM

## 2020-11-24 NOTE — Progress Notes (Signed)
Location:  [place] Room Number: 60 Place of Service:  SNF ([phone]) Provider: Windell Moulding, AGNP-C  Binnie Rail, MD  Patient Care Team: Binnie Rail, MD as PCP - General (Internal Medicine) [name], MD as PCP - Cardiology (Cardiology)  Extended Emergency Contact Information Primary Emergency Contact: [name],[name]          [place], [place] of [place] Phone: [phone] Mobile Phone: [phone] Relation: Daughter Secondary Emergency Contact: [name],[name] Address: [address]           [place], [place] [address] [name] of [place] Phone: [phone] Mobile Phone: [phone] Relation: Daughter  Code Status: Full code Goals of care: Advanced Directive information Advanced Directives 10/27/2020  Does Patient Have a Medical Advance Directive? Yes  Type of Paramedic of [place];Living will  Does patient want to make changes to medical advance directive? No - Patient declined  Copy of [place] in Chart? Yes - validated most recent copy scanned in chart (See row information)  Would patient like information on creating a medical advance directive? -     Chief Complaint  Patient presents with   Medical Management of Chronic Issues    HPI:  Pt is a 85 y.o. male seen today for medical management of chronic diseases.    He currently resides in the skilled nursing unit at [facility]. Past medical history includes: coronary atherosclerosis, hypertension, TIA, diverticular disease, diabetes, BPH, CKD, hyperlipidemia, and recurrent falls.   Hospitalized 05/05-05/09 possible CVA. CT head with no acute changes, but confirmed chronic small vessel disease. MRI unable to be done due to cochlear implant. He was advised to remain on antiplatelet for 3 weeks then resume baby aspirin. Daughter concerned he will have another episode if he switches to baby aspirin. At this time he will  remain on antiplatelet until f/u with neurology 12/07/2020.   HTN, off medication, bp controlled Gout, stable with allopurinol 100 mg daily and prn colchicine, uric acid 7.8 01/19/2020.  Coronary atherosclerosis, remains on pravastatin  Diabetes, metformin 500 mg daily, A1c 7.1 10/21/2020 CKD stage III, BUN/creat 24/1.30, GFR 55 10/21/2020 BPH, no urinary issues, finasteride 5 mg daily Hyperlipidemia, pravastatin and fenofibrate, LDL 75 10/21/2020 HOH, right cochlear implant Recurrent falls, shuffling gait, ambulating well, no recent falls, requesting wheelchair today, not recommended by PT.   Plans to move to [place] 11/25/2020.   Recent blood pressures are as follows:  06/08- 114/61  06/07- 133/76  05/31- 116/71  Recent weights:  06/01- 170.7 lbs  05/10- 175 lbs  Nurse does not report any concerns, vitals stable.         Past Medical History:  Diagnosis Date   Cancer ([place])    skin   CKD (chronic kidney disease) 06/24/2013   Coronary artery disease    a. s/p CABG 2000; b. myoview 9/08: inf-septal scar with mild peri-infarct ischemia (low risk);  c.  Echo 8/13: mild focal basal septal hypertrophy, Gr 1 DD, mild LAE;  d. Lexiscan Myoview (06/20/2013): No reversible ischemia, inferior and septal infarct, inferior and septal hypokinesis, EF 60%;  e.  Echo (06/20/2011): EF 50-55%, normal wall motion, grade 1 diastolic dysfunction, mild LAE   Diverticular disease    Duodenitis    Elevated PSA    GERD (gastroesophageal reflux disease)    Gout    Humerus fracture    right   Hydronephrosis with ureteropelvic junction obstruction    Hypercalcemia 02/02/12  a. 01/2012: 10.7.   Hyperlipidemia    Hypertension    Pancreatitis    a. ? Simvastatin related   Pneumonia age 85 or 7   "I think only once" (06/19/2013)   Syncope    a. 06/2013   TIA (transient ischemic attack)    a. 01/2012 - presentation as acute imbalance;  b. 01/2012 carotid U/S w/o signif stenosis;  c. 01/2012 Echo: EF 60-65%, Gr  1 DD.;  d.  Carotid US (06/20/2013): 1-39% bilateral ICA stenosis   Tubular adenoma polyp of rectum    Type II diabetes mellitus ([place])    "diet and exercise controlled at present" (06/19/2013)   Past Surgical History:  Procedure Laterality Date   APPENDECTOMY     CARDIAC CATHETERIZATION  2000   CHOLECYSTECTOMY     COCHLEAR IMPLANT Right 1990's   COLONOSCOPY W/ POLYPECTOMY     no polyp 2012   CORONARY ARTERY BYPASS GRAFT  2000   CABG X5   INGUINAL HERNIA REPAIR Right 09/15/2015   Procedure: OPEN REPAIR RIGHT INGUINAL HERNIA ;  Surgeon: Jackolyn Confer, MD;  Location: [facility];  Service: General;  Laterality: Right;   INSERTION OF MESH Right 09/15/2015   Procedure: INSERTION OF MESH;  Surgeon: Jackolyn Confer, MD;  Location: [facility];  Service: General;  Laterality: Right;   PARATHYROIDECTOMY  1978   POLYPECTOMY     REVERSE SHOULDER ARTHROPLASTY Right 12/30/2014   Procedure: Right shoulder ORIF, Biomet S3 Plate and ZImmer cable system ;  Surgeon: Netta Cedars, MD;  Location: [facility];  Service: Orthopedics;  Laterality: Right;   right ankle surgery  age 9   TONSILLECTOMY  43   trachestomy as child for throat closing      Allergies  Allergen Reactions   Simvastatin Other (See Comments)    Possible statin-induced pancreatitis    Outpatient Encounter Medications as of 11/24/2020  Medication Sig   acetaminophen (TYLENOL) 500 MG tablet Take 500-1,000 mg by mouth every 6 (six) hours as needed for mild pain (or headaches).   allopurinol (ZYLOPRIM) 100 MG tablet TAKE 1 TABLET(100 MG) BY MOUTH DAILY (Patient taking differently: Take 100 mg by mouth in the morning.)   blood glucose meter kit and supplies KIT Dispense based on patient and insurance preference. Use up to four times daily as directed. (FOR ICD-9 250.00, 250.01).   Choline Fenofibrate (FENOFIBRIC ACID) 135 MG CPDR TAKE 1 CAPSULE BY MOUTH DAILY (Patient taking differently: Take 135 mg by mouth daily.)   clopidogrel (PLAVIX) 75 MG tablet  Take 1 tablet (75 mg total) by mouth daily.   colchicine 0.6 MG tablet Take 0.6 mg by mouth daily as needed (as directed for gout flares).   finasteride (PROSCAR) 5 MG tablet Take 5 mg by mouth every evening.   metFORMIN (GLUCOPHAGE) 500 MG tablet TAKE 1 TABLET(500 MG) BY MOUTH DAILY WITH BREAKFAST (Patient taking differently: Take 500 mg by mouth daily with breakfast.)   pravastatin (PRAVACHOL) 20 MG tablet Take 1 tablet (20 mg total) by mouth 3 (three) times a week. (Patient taking differently: Take 20 mg by mouth every Monday, Wednesday, and Friday.)   tuberculin (TUBERSOL) 5 UNIT/0.1ML injection Inject 0.1 mLs into the skin once. Every 14 days   zinc oxide 20 % ointment Apply 1 application topically as needed for irritation.   No facility-administered encounter medications on file as of 11/24/2020.    Review of Systems  Constitutional:  Negative for activity change, appetite change, fatigue and fever.  HENT:  Positive for hearing loss and trouble swallowing. Negative for dental problem.        Right cochlear implant  Eyes:  Negative for visual disturbance.       Glasses  Respiratory:  Negative for cough, shortness of breath and wheezing.   Cardiovascular:  Negative for chest pain and leg swelling.  Gastrointestinal:  Negative for abdominal distention, abdominal pain, constipation, diarrhea and nausea.  Genitourinary:  Positive for frequency. Negative for dysuria and hematuria.  Musculoskeletal:  Positive for gait problem.  Skin: Negative.   Neurological:  Positive for weakness. Negative for dizziness, facial asymmetry and headaches.  Hematological:  Bruises/bleeds easily.  Psychiatric/Behavioral:  Positive for confusion. Negative for dysphoric mood. The patient is not nervous/anxious.    Immunization History  Administered Date(s) Administered   Fluad Quad(high Dose 65+) 04/01/2019   Influenza Split 04/13/2011, 05/02/2012   Influenza Whole 05/22/2007, 04/12/2008, 05/02/2009,  04/14/2010   Influenza, High Dose Seasonal PF 04/17/2013, 03/16/2014, 03/05/2016, 03/18/2017   Influenza,inj,Quad PF,6+ Mos 03/02/2015, 03/18/2018   Influenza-Unspecified 03/18/2018   Moderna Sars-Covid-2 Vaccination 06/22/2019, 07/20/2019, 05/02/2020   PPD Test 01/02/2015   Pneumococcal Conjugate-13 12/29/2015   Pneumococcal Polysaccharide-23 04/19/2006   Tdap 09/04/2016   Zoster, Live 06/29/2015   Pertinent  Health Maintenance Due  Topic Date Due   URINE MICROALBUMIN  01/15/2020   FOOT EXAM  07/20/2020   INFLUENZA VACCINE  01/16/2021   HEMOGLOBIN A1C  04/23/2021   OPHTHALMOLOGY EXAM  08/31/2021   PNA vac Low Risk Adult  Completed   Fall Risk  10/13/2020 08/23/2020 07/21/2019 01/15/2019 03/18/2017  Falls in the past year? 1 0 0 0 No  Number falls in past yr: 1 0 0 0 -  Injury with Fall? 1 0 - - -  Comment left arm and knee pain - - - -  Risk for fall due to : - No Fall Risks - - -  Follow up Falls evaluation completed Falls evaluation completed - - -  Comment - - - - -   Functional Status Survey:    Vitals:   11/24/20 1339  BP: 114/61  Pulse: 80  Resp: 20  Temp: (!) 97.3 F (36.3 C)  SpO2: 96%  Weight: 170 lb 11.2 oz (77.4 kg)  Height: '5\' 9"'  (1.753 m)   Body mass index is 25.21 kg/m. Physical Exam Constitutional:      General: He is not in acute distress.    Appearance: He is normal weight.  HENT:     Head: Normocephalic.     Right Ear: There is no impacted cerumen.     Left Ear: There is no impacted cerumen.     Nose: Nose normal.     Mouth/Throat:     Mouth: Mucous membranes are moist.  Eyes:     General:        Right eye: No discharge.        Left eye: No discharge.  Cardiovascular:     Rate and Rhythm: Normal rate and regular rhythm.     Pulses: Normal pulses.     Heart sounds: Normal heart sounds. No murmur heard. Pulmonary:     Effort: Pulmonary effort is normal. No respiratory distress.     Breath sounds: Normal breath sounds. No wheezing.   Abdominal:     General: Bowel sounds are normal. There is no distension.     Palpations: Abdomen is soft.     Tenderness: There is no abdominal tenderness.  Musculoskeletal:     Cervical  back: Normal range of motion.     Right lower leg: No edema.     Left lower leg: No edema.  Lymphadenopathy:     Cervical: No cervical adenopathy.  Skin:    General: Skin is warm and dry.     Capillary Refill: Capillary refill takes less than 2 seconds.  Neurological:     General: No focal deficit present.     Mental Status: He is alert and oriented to person, place, and time.     Motor: Weakness present.     Gait: Gait abnormal.     Comments: walker  Psychiatric:        Mood and Affect: Mood normal.        Behavior: Behavior normal.    Labs reviewed: Recent Labs    10/21/20 0259 10/22/20 0021 10/22/20 1222  NA 140 140 138  K 3.5 3.6 3.6  CL 108 110 107  CO2 '25 24 24  ' GLUCOSE 121* 110* 121*  BUN 28* 31* 24*  CREATININE 1.46* 1.73* 1.30*  CALCIUM 10.0 9.7 10.0   Recent Labs    01/19/20 1030 08/23/20 1154 10/20/20 1315  AST '18 22 17  ' ALT '14 19 14  ' ALKPHOS  --  36* 32*  BILITOT 0.6 0.5 0.4  PROT 6.8 6.9 6.3*  ALBUMIN  --  4.0 3.7   Recent Labs    01/19/20 1030 08/23/20 1154 10/20/20 1315  WBC 5.8 5.6 6.3  NEUTROABS 3,074 3.1 3.9  HGB 13.6 13.0 12.5*  HCT 40.3 38.5* 39.1  MCV 89.4 87.6 92.7  PLT 231 264.0 265   Lab Results  Component Value Date   TSH 2.21 07/03/2016   Lab Results  Component Value Date   HGBA1C 7.1 (H) 10/21/2020   Lab Results  Component Value Date   CHOL 135 10/21/2020   HDL 31 (L) 10/21/2020   LDLCALC 75 10/21/2020   LDLDIRECT 96.0 08/23/2020   TRIG 145 10/21/2020   CHOLHDL 4.4 10/21/2020    Significant Diagnostic Results in last 30 days:  No results found.  Assessment/Plan 1. TIA (transient ischemic attack) - no recent events - cont plavix daily - f/u with neuro 06/22 - cont PT/OT  2. Atherosclerosis of native coronary artery  of native heart without angina pectoris - cont statin - will wait for neuro opinion on baby aspirin  3. Essential hypertension, benign - controlled without medications - continue low sodium diet  4. Stage 3a chronic kidney disease (HCC) - BUN/creat 24/1.30, GFR 55 10/21/2020 - continue to avoid nephrotoxic medications and dose adjust meds to be renally excreted - hydrate  5. History of gout - stable with daily allopurinol and prn colchicine - uric acid 7.8 01/19/2020  6. Mixed hyperlipidemia - LDL 75 10/21/2020 - cont statin and fenofibrate - cont low fat diet and avoid fried foods  7. Type 2 diabetes mellitus with stage 3a chronic kidney disease, without long-term current use of insulin (HCC) - A1c 7.1 10/21/2020 - cont metformin 500 mg daily - daily sugars averaging 140's, no hypoglycemic events  8. Benign prostatic hyperplasia with urinary obstruction - no urinary issues - cont finasteride  9. Hearing loss of right ear, unspecified hearing loss type - right cochlear implant  10. Recurrent falls - shuffles feet - requesting wheelchair, but not recommended by PT    Family/ staff Communication: plan discussed with patient and nurse  Labs/tests ordered:  none

## 2020-11-28 ENCOUNTER — Encounter: Payer: Self-pay | Admitting: Orthopedic Surgery

## 2020-11-28 ENCOUNTER — Non-Acute Institutional Stay: Payer: Federal, State, Local not specified - PPO | Admitting: Orthopedic Surgery

## 2020-11-28 DIAGNOSIS — R296 Repeated falls: Secondary | ICD-10-CM

## 2020-11-28 NOTE — Progress Notes (Signed)
Location:   [place] Room Number: [place] of Service:  ALF [phone]) Provider:  Windell Moulding, NP    Patient Care Team: Binnie Rail, MD as PCP - General (Internal Medicine) [name], MD as PCP - Cardiology (Cardiology)  Extended Emergency Contact Information Primary Emergency Contact: [place], [place] of [place] Phone: [phone] Mobile Phone: [phone] Relation: Daughter Secondary Emergency Contact: [name],[name] Address: [address]           [place], [place] [address] [name] of [place] Phone: [phone] Mobile Phone: [phone] Relation: Daughter  Code Status:  FULL CODE Goals of care: Advanced Directive information Advanced Directives 11/28/2020  Does Patient Have a Medical Advance Directive? Yes  Type of Paramedic of [place];Living will  Does patient want to make changes to medical advance directive? No - Patient declined  Copy of [place] in Chart? Yes - validated most recent copy scanned in chart (See row information)  Would patient like information on creating a medical advance directive? -     Chief Complaint  Patient presents with   Acute Visit    Patient presents after a fall with a skin tears.     HPI:  Pt is a 85 y.o. male seen today for an acute visit for fall.   He currently resides in assisted living at [facility] medical history includes: coronary atherosclerosis, hypertension, TIA, diverticular disease, diabetes, BPH, CKD, hyperlipidemia, and recurrent falls.   06/10 he was transferred from SNF to assisted living. Hospitalized 05/05- 05/09 for possible CVA. He did well with PT/OT and transferred ambulating with walker. 06/12 he was found by nurse laying naked on the bathroom floor. He states" I slipped off the toilet." He was unable to use call bell ust waited for assistance. Small bruising to right elbow. He  denies hitting his head, pain or weakness.   Neuro checks and vitals stable.   Nurse does not report any concerns, vitals stable.    Past Medical History:  Diagnosis Date   Cancer ([place])    skin   CKD (chronic kidney disease) 06/24/2013   Coronary artery disease    a. s/p CABG 2000; b. myoview 9/08: inf-septal scar with mild peri-infarct ischemia (low risk);  c.  Echo 8/13: mild focal basal septal hypertrophy, Gr 1 DD, mild LAE;  d. Lexiscan Myoview (06/20/2013): No reversible ischemia, inferior and septal infarct, inferior and septal hypokinesis, EF 60%;  e.  Echo (06/20/2011): EF 50-55%, normal wall motion, grade 1 diastolic dysfunction, mild LAE   Diverticular disease    Duodenitis    Elevated PSA    GERD (gastroesophageal reflux disease)    Gout    Humerus fracture    right   Hydronephrosis with ureteropelvic junction obstruction    Hypercalcemia 02/02/12   a. 01/2012: 10.7.   Hyperlipidemia    Hypertension    Pancreatitis    a. ? Simvastatin related   Pneumonia age 31 or 7   "I think only once" (06/19/2013)   Syncope    a. 06/2013   TIA (transient ischemic attack)    a. 01/2012 - presentation as acute imbalance;  b. 01/2012 carotid U/S w/o signif stenosis;  c. 01/2012 Echo: EF 60-65%, Gr 1 DD.;  d.  Carotid US (06/20/2013): 1-39% bilateral ICA stenosis   Tubular adenoma polyp of rectum    Type II diabetes mellitus ([place])    "  diet and exercise controlled at present" (06/19/2013)   Past Surgical History:  Procedure Laterality Date   APPENDECTOMY     CARDIAC CATHETERIZATION  2000   CHOLECYSTECTOMY     COCHLEAR IMPLANT Right 1990's   COLONOSCOPY W/ POLYPECTOMY     no polyp 2012   CORONARY ARTERY BYPASS GRAFT  2000   CABG X5   INGUINAL HERNIA REPAIR Right 09/15/2015   Procedure: OPEN REPAIR RIGHT INGUINAL HERNIA ;  Surgeon: Jackolyn Confer, MD;  [facility];  Service: General;  Laterality: Right;   INSERTION OF MESH Right 09/15/2015   Procedure: INSERTION OF MESH;  Surgeon: Jackolyn Confer, MD;  [facility];  Service: General;  Laterality: Right;   PARATHYROIDECTOMY  1978   POLYPECTOMY     REVERSE SHOULDER ARTHROPLASTY Right 12/30/2014   Procedure: Right shoulder ORIF, Biomet S3 Plate and ZImmer cable system ;  Surgeon: Netta Cedars, MD;  Location: [facility];  Service: Orthopedics;  Laterality: Right;   right ankle surgery  age 79   TONSILLECTOMY  71   trachestomy as child for throat closing      Allergies  Allergen Reactions   Simvastatin Other (See Comments)    Possible statin-induced pancreatitis    Allergies as of 11/28/2020       Reactions   Simvastatin Other (See Comments)   Possible statin-induced pancreatitis        Medication List        Accurate as of November 28, 2020  2:25 PM. If you have any questions, ask your nurse or doctor.          STOP taking these medications    Tubersol 5 UNIT/0.1ML injection Generic drug: tuberculin Stopped by: [name], NP   zinc oxide 20 % ointment Stopped by: [name], NP       TAKE these medications    acetaminophen 500 MG tablet Commonly known as: TYLENOL Take 500 mg by mouth 2 (two) times daily.   blood glucose meter kit and supplies Kit Dispense based on patient and insurance preference. Use up to four times daily as directed. (FOR ICD-9 250.00, 250.01).   clopidogrel 75 MG tablet Commonly known as: PLAVIX Take 1 tablet (75 mg total) by mouth daily.   colchicine 0.6 MG tablet Take 0.6 mg by mouth daily as needed (as directed for gout flares).   Fenofibric Acid 135 MG Cpdr Take 1 tablet by mouth in the morning. What changed: Another medication with the same name was removed. Continue taking this medication, and follow the directions you see here. Changed by: [name], NP   finasteride 5 MG tablet Commonly known as: PROSCAR Take 5 mg by mouth every evening.   metFORMIN 500 MG tablet Commonly known as: GLUCOPHAGE Take 500 mg by mouth in the morning. What changed:  Another medication with the same name was removed. Continue taking this medication, and follow the directions you see here. Changed by: [name], NP   pravastatin 20 MG tablet Commonly known as: PRAVACHOL Take 20 mg by mouth daily. On MON, WED, FRI What changed: Another medication with the same name was removed. Continue taking this medication, and follow the directions you see here. Changed by: [name], NP   Zyloprim 100 MG tablet Generic drug: allopurinol Take 100 mg by mouth daily. What changed: Another medication with the same name was removed. Continue taking this medication, and follow the directions you see here. Changed by: [name],  NP        Review of Systems  Constitutional:  Negative for activity change and appetite change.  HENT:  Positive for hearing loss.        Right cochlear implant  Respiratory:  Negative for cough, shortness of breath and wheezing.   Cardiovascular:  Negative for chest pain and leg swelling.  Musculoskeletal:  Positive for gait problem.  Skin:  Positive for wound.  Psychiatric/Behavioral:  Negative for dysphoric mood. The patient is not nervous/anxious.    Immunization History  Administered Date(s) Administered   Fluad Quad(high Dose 65+) 04/01/2019   Influenza Split 04/13/2011, 05/02/2012   Influenza Whole 05/22/2007, 04/12/2008, 05/02/2009, 04/14/2010   Influenza, High Dose Seasonal PF 04/17/2013, 03/16/2014, 03/05/2016, 03/18/2017   Influenza,inj,Quad PF,6+ Mos 03/02/2015, 03/18/2018   Influenza-Unspecified 03/18/2018   Moderna Sars-Covid-2 Vaccination 06/22/2019, 07/20/2019, 05/02/2020   PPD Test 01/02/2015   Pneumococcal Conjugate-13 12/29/2015   Pneumococcal Polysaccharide-23 04/19/2006   Tdap 09/04/2016   Zoster, Live 06/29/2015   Pertinent  Health Maintenance Due  Topic Date Due   URINE MICROALBUMIN  01/15/2020   FOOT EXAM  07/20/2020   INFLUENZA VACCINE  01/16/2021   HEMOGLOBIN A1C  04/23/2021   OPHTHALMOLOGY  EXAM  08/31/2021   PNA vac Low Risk Adult  Completed   Fall Risk  10/13/2020 08/23/2020 07/21/2019 01/15/2019 03/18/2017  Falls in the past year? 1 0 0 0 No  Number falls in past yr: 1 0 0 0 -  Injury with Fall? 1 0 - - -  Comment left arm and knee pain - - - -  Risk for fall due to : - No Fall Risks - - -  Follow up Falls evaluation completed Falls evaluation completed - - -  Comment - - - - -   Functional Status Survey:    Vitals:   11/28/20 1412  BP: 122/76  Pulse: 94  Resp: 18  Temp: (!) 97 F (36.1 C)  SpO2: 96%  Weight: 169 lb (76.7 kg)  Height: '5\' 9"'  (1.753 m)   Body mass index is 24.96 kg/m. Physical Exam Vitals reviewed.  Constitutional:      General: He is not in acute distress. HENT:     Head: Normocephalic and atraumatic.  Cardiovascular:     Rate and Rhythm: Normal rate and regular rhythm.     Pulses: Normal pulses.     Heart sounds: Normal heart sounds. No murmur heard. Pulmonary:     Effort: Pulmonary effort is normal. No respiratory distress.     Breath sounds: Normal breath sounds. No wheezing.  Skin:    General: Skin is warm and dry.     Capillary Refill: Capillary refill takes less than 2 seconds.     Findings: Bruising present.     Comments: Quarter sized purple bruise to right elbow, no skin breakdown, FROM.   Neurological:     General: No focal deficit present.     Mental Status: He is alert and oriented to person, place, and time.     Motor: Weakness present.     Gait: Gait abnormal.     Comments: walker  Psychiatric:        Mood and Affect: Mood normal.        Behavior: Behavior normal.    Labs reviewed: Recent Labs    10/21/20 0259 10/22/20 0021 10/22/20 1222  NA 140 140 138  K 3.5 3.6 3.6  CL 108 110 107  CO2 '25 24 24  ' GLUCOSE 121* 110*  121*  BUN 28* 31* 24*  CREATININE 1.46* 1.73* 1.30*  CALCIUM 10.0 9.7 10.0   Recent Labs    01/19/20 1030 08/23/20 1154 10/20/20 1315  AST '18 22 17  ' ALT '14 19 14  ' ALKPHOS  --  36* 32*   BILITOT 0.6 0.5 0.4  PROT 6.8 6.9 6.3*  ALBUMIN  --  4.0 3.7   Recent Labs    01/19/20 1030 08/23/20 1154 10/20/20 1315  WBC 5.8 5.6 6.3  NEUTROABS 3,074 3.1 3.9  HGB 13.6 13.0 12.5*  HCT 40.3 38.5* 39.1  MCV 89.4 87.6 92.7  PLT 231 264.0 265   Lab Results  Component Value Date   TSH 2.21 07/03/2016   Lab Results  Component Value Date   HGBA1C 7.1 (H) 10/21/2020   Lab Results  Component Value Date   CHOL 135 10/21/2020   HDL 31 (L) 10/21/2020   LDLCALC 75 10/21/2020   LDLDIRECT 96.0 08/23/2020   TRIG 145 10/21/2020   CHOLHDL 4.4 10/21/2020    Significant Diagnostic Results in last 30 days:  No results found.  Assessment/Plan 1. Recurrent falls - 06/12 fall in bathroom - right elbow bruised, no other injury - right elbow FROM - neuro checks and vitals wnl - cont tylenol prn - cont PT/OT    Family/ staff Communication: plan discussed with patient and nurse  Labs/tests ordered:  none

## 2020-12-07 ENCOUNTER — Encounter: Payer: Self-pay | Admitting: Diagnostic Neuroimaging

## 2020-12-07 ENCOUNTER — Ambulatory Visit (INDEPENDENT_AMBULATORY_CARE_PROVIDER_SITE_OTHER): Payer: Federal, State, Local not specified - PPO | Admitting: Diagnostic Neuroimaging

## 2020-12-07 VITALS — BP 114/65 | HR 91 | Ht 71.0 in | Wt 170.2 lb

## 2020-12-07 DIAGNOSIS — R413 Other amnesia: Secondary | ICD-10-CM | POA: Diagnosis not present

## 2020-12-07 DIAGNOSIS — R269 Unspecified abnormalities of gait and mobility: Secondary | ICD-10-CM

## 2020-12-07 NOTE — Progress Notes (Signed)
 [facility]  PATIENT: Juan [name] DOB: 05/13/1936  REFERRING CLINICIAN: Burns, Stacy J, MD HISTORY FROM: patient  REASON FOR VISIT: new consult    HISTORICAL  CHIEF COMPLAINT:  Chief Complaint  Patient presents with   Transient Ischemic Attack    Rm 7 New Pt ED referral, dgtr- HC POA- Juan [name] Also Referred for shuffling gait and falls    HISTORY OF PRESENT ILLNESS:   84-year-old male here for evaluation of TIA and gait difficulty.  History of hypertension, hyperlipidemia, coronary artery disease, CABG, diabetes and TIA.  Patient also has hearing impairment status post cochlear implant.  Patient presented to hospital on 10/20/2020 after developing slurred speech and left-sided weakness.  He was admitted to the hospital for TIA stroke work-up.  Symptoms resolved within 15 to 30 minutes.  Stroke work-up was completed.  He was not able to get MRI due to cochlear implant.  Patient has had some chronic gait difficulty going back to 2017.  At that time he fell down and broke his right arm.  Since that time he started to take very small short steps.  This was described as a "shuffling gait".  This is progressively worsened over time.  In the last 3 to 6 months he has been having increasing gait and balance difficulties.  He was having increasing falls.  Also was having some decline in cognitive abilities and ADLs.    REVIEW OF SYSTEMS: Full 14 system review of systems performed and negative with exception of: As per HPI.  ALLERGIES: Allergies  Allergen Reactions   Simvastatin Other (See Comments)    Possible statin-induced pancreatitis    HOME MEDICATIONS: Outpatient Medications Prior to Visit  Medication Sig Dispense Refill   acetaminophen (TYLENOL) 500 MG tablet Take 500 mg by mouth 2 (two) times daily.     allopurinol (ZYLOPRIM) 100 MG tablet Take 100 mg by mouth daily.     blood glucose meter kit and supplies KIT Dispense based on patient and insurance  preference. Use up to four times daily as directed. (FOR ICD-9 250.00, 250.01). 1 each 0   Choline Fenofibrate (FENOFIBRIC ACID) 135 MG CPDR Take 1 tablet by mouth in the morning.     clopidogrel (PLAVIX) 75 MG tablet Take 1 tablet (75 mg total) by mouth daily.     colchicine 0.6 MG tablet Take 0.6 mg by mouth daily as needed (as directed for gout flares).     finasteride (PROSCAR) 5 MG tablet Take 5 mg by mouth every evening.     metFORMIN (GLUCOPHAGE) 500 MG tablet Take 500 mg by mouth in the morning.     pravastatin (PRAVACHOL) 20 MG tablet Take 20 mg by mouth daily. On MON, WED, FRI     No facility-administered medications prior to visit.    PAST MEDICAL HISTORY: Past Medical History:  Diagnosis Date   Cancer (HCC)    skin   CKD (chronic kidney disease) 06/24/2013   Cochlear implant in place    Coronary artery disease    a. s/p CABG 2000; b. myoview 9/08: inf-septal scar with mild peri-infarct ischemia (low risk);  c.  Echo 8/13: mild focal basal septal hypertrophy, Gr 1 DD, mild LAE;  d. Lexiscan Myoview (06/20/2013): No reversible ischemia, inferior and septal infarct, inferior and septal hypokinesis, EF 60%;  e.  Echo (06/20/2011): EF 50-55%, normal wall motion, grade 1 diastolic dysfunction, mild LAE   Diverticular disease    Duodenitis    Elevated PSA      GERD (gastroesophageal reflux disease)    Gout    Humerus fracture    right   Hydronephrosis with ureteropelvic junction obstruction    Hypercalcemia 02/02/2012   a. 01/2012: 10.7.   Hyperlipidemia    Hypertension    Pancreatitis    a. ? Simvastatin related   Pneumonia age 21 or 7   "I think only once" (06/19/2013)   Syncope    a. 06/2013   TIA (transient ischemic attack)    a. 01/2012 - presentation as acute imbalance;  b. 01/2012 carotid U/S w/o signif stenosis;  c. 01/2012 Echo: EF 60-65%, Gr 1 DD.;  d.  Carotid US (06/20/2013): 1-39% bilateral ICA stenosis   Tubular adenoma polyp of rectum    Type II diabetes mellitus ([place])     "diet and exercise controlled at present" (06/19/2013)    PAST SURGICAL HISTORY: Past Surgical History:  Procedure Laterality Date   APPENDECTOMY     CARDIAC CATHETERIZATION  2000   CHOLECYSTECTOMY     COCHLEAR IMPLANT Right 1990's   COLONOSCOPY W/ POLYPECTOMY     no polyp 2012   CORONARY ARTERY BYPASS GRAFT  2000   CABG X5   INGUINAL HERNIA REPAIR Right 09/15/2015   Procedure: OPEN REPAIR RIGHT INGUINAL HERNIA ;  Surgeon: Jackolyn Confer, MD;  Location: [facility];  Service: General;  Laterality: Right;   INSERTION OF MESH Right 09/15/2015   Procedure: INSERTION OF MESH;  Surgeon: Jackolyn Confer, MD;  Location: [facility];  Service: General;  Laterality: Right;   PARATHYROIDECTOMY  1978   POLYPECTOMY     REVERSE SHOULDER ARTHROPLASTY Right 12/30/2014   Procedure: Right shoulder ORIF, Biomet S3 Plate and ZImmer cable system ;  Surgeon: Netta Cedars, MD;  Location: [place] [facility];  Service: Orthopedics;  Laterality: Right;   right ankle surgery  age 51   TONSILLECTOMY  26   trachestomy as child for throat closing      FAMILY HISTORY: Family History  Problem Relation Age of Onset   Diabetes Mother    Melanoma Father    Heart attack Father         in 37s    SOCIAL HISTORY: Social History   Socioeconomic History   Marital status: Widowed    Spouse name: Not on file   Number of children: 2   Years of education: college   Highest education level: Not on file  Occupational History   Occupation: retired    Fish farm manager: RETIRED  Tobacco Use   Smoking status: Former    Pack years: 0.00    Types: Cigars   Smokeless tobacco: Never   Tobacco comments:    06/19/2013 "rare cigar", 12/07/20 quit  Substance and Sexual Activity   Alcohol use: Yes    Comment: 12/07/20 1-2 a week   Drug use: No   Sexual activity: Not Currently    Birth control/protection: Injection  Other Topics Concern   Not on file  Social History Narrative   12/07/20 lives at  [place]   Does get regular exercise   No  caffeine   Social Determinants of Health   Financial Resource Strain: Not on file  Food Insecurity: Not on file  Transportation Needs: Not on file  Physical Activity: Not on file  Stress: Not on file  Social Connections: Not on file  Intimate Partner Violence: Not on file     PHYSICAL EXAM  GENERAL EXAM/CONSTITUTIONAL: Vitals:  Vitals:   12/07/20 1054  BP: 114/65  Pulse: 91  Weight: 170 lb 3.2 oz (77.2 kg)  Height: 5' 11" (1.803 m)   Body mass index is 23.74 kg/m. Wt Readings from Last 3 Encounters:  12/07/20 170 lb 3.2 oz (77.2 kg)  11/28/20 169 lb (76.7 kg)  11/24/20 170 lb 11.2 oz (77.4 kg)   Patient is in no distress; well developed, nourished and groomed; neck is supple  CARDIOVASCULAR: Examination of carotid arteries is normal; no carotid bruits Regular rate and rhythm, no murmurs Examination of peripheral vascular system by observation and palpation is normal  EYES: Ophthalmoscopic exam of optic discs and posterior segments is normal; no papilledema or hemorrhages No results found.  MUSCULOSKELETAL: Gait, strength, tone, movements noted in Neurologic exam below  NEUROLOGIC: MENTAL STATUS:  No flowsheet data found. awake, alert, oriented to person, place and time recent and remote memory intact normal attention and concentration language fluent, comprehension intact, naming intact fund of knowledge appropriate  CRANIAL NERVE:  2nd - no papilledema on fundoscopic exam 2nd, 3rd, 4th, 6th - pupils equal and reactive to light, visual fields full to confrontation, extraocular muscles intact, no nystagmus 5th - facial sensation symmetric 7th - facial strength symmetric 8th - hearing --> DECR 9th - palate elevates symmetrically, uvula midline 11th - shoulder shrug symmetric 12th - tongue protrusion midline  MOTOR:  normal bulk and tone, full strength in the BUE, BLE MILD BRADYKINESIA IN RUE AND BLE MILD APRAXIA  SENSORY:  normal and symmetric to  light touch, temperature, vibration  COORDINATION:  finger-nose-finger, fine finger movements SLOW  REFLEXES:  deep tendon reflexes TRACE  and symmetric  GAIT/STATION:  SHORT STEPS; USING WALKER     DIAGNOSTIC DATA (LABS, IMAGING, TESTING) - I reviewed patient records, labs, notes, testing and imaging myself where available.  Lab Results  Component Value Date   WBC 6.3 10/20/2020   HGB 12.5 (L) 10/20/2020   HCT 39.1 10/20/2020   MCV 92.7 10/20/2020   PLT 265 10/20/2020      Component Value Date/Time   NA 138 10/22/2020 1222   K 3.6 10/22/2020 1222   CL 107 10/22/2020 1222   CO2 24 10/22/2020 1222   GLUCOSE 121 (H) 10/22/2020 1222   GLUCOSE 127 (H) 05/16/2006 1101   BUN 24 (H) 10/22/2020 1222   CREATININE 1.30 (H) 10/22/2020 1222   CREATININE 1.38 (H) 01/19/2020 1030   CALCIUM 10.0 10/22/2020 1222   PROT 6.3 (L) 10/20/2020 1315   ALBUMIN 3.7 10/20/2020 1315   AST 17 10/20/2020 1315   ALT 14 10/20/2020 1315   ALKPHOS 32 (L) 10/20/2020 1315   BILITOT 0.4 10/20/2020 1315   GFRNONAA 55 (L) 10/22/2020 1222   GFRNONAA 48 (L) 01/19/2020 1030   GFRAA 55 (L) 01/19/2020 1030   Lab Results  Component Value Date   CHOL 135 10/21/2020   HDL 31 (L) 10/21/2020   LDLCALC 75 10/21/2020   LDLDIRECT 96.0 08/23/2020   TRIG 145 10/21/2020   CHOLHDL 4.4 10/21/2020   Lab Results  Component Value Date   HGBA1C 7.1 (H) 10/21/2020   No results found for: [id number] Lab Results  Component Value Date   TSH 2.21 07/03/2016    CTA head / neck 1. No intracranial arterial occlusion or high-grade stenosis. 2. 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  10/21/20 TTE - No intracardiac source of embolism detected on this transthoracic study. A transesophageal  echocardiogram is recommended to exclude cardiac source of embolism if clinically indicated.     ASSESSMENT AND PLAN  84  y.o. year old male here with hypertension, diabetes, hyperlipidemia, coronary artery  disease, presenting with transient spell of speech abnormality and left-sided weakness, possibly TIA.  Also with underlying progressive gait and balance difficulty and cognitive changes for several months to several years.   Dx:  1. Gait difficulty   2. Memory deficit     PLAN:  TRANSIENT CONFUSION / SLURRED SPEECH / LUE WEAKNESS (possible TIA) - continue plavix  daily (single anti-platelet) long term - continue metformin, statin  GAIT DIFFICULTY / SHORT STEPS / SHUFFLING GAIT - possible underlying neurodegenerative dz (ddx: postural instability gait d/o parkinsonism vs mild dementia) - continue PT evaluation; use walker  Return in about 4 months (around 04/08/2021).    Penni Bombard, MD 2/83/1517, 61:60 AM Certified in Neurology, Neurophysiology and Neuroimaging  [facility] [address] [place], [place] [address] [phone]

## 2020-12-07 NOTE — Patient Instructions (Signed)
  TRANSIENT CONFUSION / SLURRED SPEECH / LUE WEAKNESS (possible TIA) - continue plavix  daily (single anti-platelet) long term - continue metformin, statin   GAIT DIFFICULTY / SHORT STEPS / SHUFFLING GAIT - suspect underlying neurodegenerative dz (ddx: postural instability gait d/o parkinsonism vs mild dementia) - continue PT evaluation

## 2020-12-21 ENCOUNTER — Non-Acute Institutional Stay (SKILLED_NURSING_FACILITY): Payer: Federal, State, Local not specified - PPO | Admitting: Orthopedic Surgery

## 2020-12-21 ENCOUNTER — Encounter: Payer: Self-pay | Admitting: Orthopedic Surgery

## 2020-12-21 DIAGNOSIS — R059 Cough, unspecified: Secondary | ICD-10-CM

## 2020-12-21 DIAGNOSIS — R0981 Nasal congestion: Secondary | ICD-10-CM | POA: Diagnosis not present

## 2020-12-21 NOTE — Progress Notes (Signed)
Location:   [facility] Room Number: [place] of Service:  ALF [phone]) Provider:  Windell Moulding, NP  [name], NP  Patient Care Team: [name], NP as PCP - General (Adult Health Nurse Practitioner) [name], MD as PCP - Cardiology (Cardiology)  Extended Emergency Contact Information Primary Emergency Contact: [name],[name]          [place], [place] of [place] Phone: [phone] Mobile Phone: [phone] Relation: Daughter Secondary Emergency Contact: [name],[name] Address: [address]           [place], [place] [address] [name] of [place] Phone: [phone] Mobile Phone: [phone] Relation: Daughter  Code Status:  FULL CODE Goals of care: Advanced Directive information Advanced Directives 12/21/2020  Does Patient Have a Medical Advance Directive? Yes  Type of Paramedic of [place];Living will  Does patient want to make changes to medical advance directive? No - Patient declined  Copy of [place] in Chart? Yes - validated most recent copy scanned in chart (See row information)  Would patient like information on creating a medical advance directive? -     Chief Complaint  Patient presents with   Acute Visit    Patient complains off nasal congestion and fatigue     HPI:  Pt is a 85 y.o. male seen today for an acute visit for cough, nasal congestion and fatigue.   Symptoms began two days ago. He reports feeling tired and began to have a dry cough 07/04. Nasal congestion clear, began this morning. Covid test 07/05 negative. Facility plans to retest 16/60 per policy. NP started Robafen yesterday for cough. He denies chest pain, sob, fever, body aches and sore throat.   He is up to date on his covid vaccination, including second booster.    Past Medical History:  Diagnosis Date   Cancer ([place])    skin   CKD (chronic kidney disease) 06/24/2013   Cochlear implant in  place    Coronary artery disease    a. s/p CABG 2000; b. myoview 9/08: inf-septal scar with mild peri-infarct ischemia (low risk);  c.  Echo 8/13: mild focal basal septal hypertrophy, Gr 1 DD, mild LAE;  d. Lexiscan Myoview (06/20/2013): No reversible ischemia, inferior and septal infarct, inferior and septal hypokinesis, EF 60%;  e.  Echo (06/20/2011): EF 50-55%, normal wall motion, grade 1 diastolic dysfunction, mild LAE   Diverticular disease    Duodenitis    Elevated PSA    GERD (gastroesophageal reflux disease)    Gout    Humerus fracture    right   Hydronephrosis with ureteropelvic junction obstruction    Hypercalcemia 02/02/2012   a. 01/2012: 10.7.   Hyperlipidemia    Hypertension    Pancreatitis    a. ? Simvastatin related   Pneumonia age 35 or 7   "I think only once" (06/19/2013)   Syncope    a. 06/2013   TIA (transient ischemic attack)    a. 01/2012 - presentation as acute imbalance;  b. 01/2012 carotid U/S w/o signif stenosis;  c. 01/2012 Echo: EF 60-65%, Gr 1 DD.;  d.  Carotid US (06/20/2013): 1-39% bilateral ICA stenosis   Tubular adenoma polyp of rectum    Type II diabetes mellitus ([place])    "diet and exercise controlled at present" (06/19/2013)   Past Surgical History:  Procedure Laterality Date   [place]  COCHLEAR IMPLANT Right 1990's   COLONOSCOPY W/ POLYPECTOMY     no polyp 2012   CORONARY ARTERY BYPASS GRAFT  2000   CABG X5   INGUINAL HERNIA REPAIR Right 09/15/2015   Procedure: OPEN REPAIR RIGHT INGUINAL HERNIA ;  Surgeon: Jackolyn Confer, MD;  Location: [facility];  Service: General;  Laterality: Right;   INSERTION OF MESH Right 09/15/2015   Procedure: INSERTION OF MESH;  Surgeon: Jackolyn Confer, MD;  Location: [facility];  Service: General;  Laterality: Right;   PARATHYROIDECTOMY  1978   POLYPECTOMY     REVERSE SHOULDER ARTHROPLASTY Right 12/30/2014   Procedure: Right shoulder ORIF, Biomet S3 Plate and ZImmer cable  system ;  Surgeon: Netta Cedars, MD;  Location: [facility];  Service: Orthopedics;  Laterality: Right;   right ankle surgery  age 89   TONSILLECTOMY  49   trachestomy as child for throat closing      Allergies  Allergen Reactions   Simvastatin Other (See Comments)    Possible statin-induced pancreatitis    Allergies as of 12/21/2020       Reactions   Simvastatin Other (See Comments)   Possible statin-induced pancreatitis        Medication List        Accurate as of December 21, 2020 11:35 AM. If you have any questions, ask your nurse or doctor.          acetaminophen 500 MG tablet Commonly known as: TYLENOL Take 500 mg by mouth 2 (two) times daily.   allopurinol 100 MG tablet Commonly known as: ZYLOPRIM Take 100 mg by mouth daily.   blood glucose meter kit and supplies Kit Dispense based on patient and insurance preference. Use up to four times daily as directed. (FOR ICD-9 250.00, 250.01).   clopidogrel 75 MG tablet Commonly known as: PLAVIX Take 1 tablet (75 mg total) by mouth daily.   colchicine 0.6 MG tablet Take 0.6 mg by mouth daily as needed (as directed for gout flares).   Dextromethorphan-guaiFENesin 10-100 MG/5ML liquid Take 10 mLs by mouth every 6 (six) hours as needed.   Fenofibric Acid 135 MG Cpdr Take 1 tablet by mouth in the morning.   finasteride 5 MG tablet Commonly known as: PROSCAR Take 5 mg by mouth every evening.   metFORMIN 500 MG tablet Commonly known as: GLUCOPHAGE Take 500 mg by mouth in the morning.   pravastatin 20 MG tablet Commonly known as: PRAVACHOL Take 20 mg by mouth daily. On MON, WED, FRI        Review of Systems  Constitutional:  Positive for fatigue. Negative for activity change, appetite change, chills and fever.  HENT:  Positive for congestion and rhinorrhea. Negative for sinus pressure and sinus pain.   Respiratory:  Positive for cough. Negative for shortness of breath and wheezing.   Cardiovascular:  Negative for  chest pain and leg swelling.  Gastrointestinal:  Negative for diarrhea, nausea and vomiting.  Musculoskeletal:  Negative for arthralgias and myalgias.  Psychiatric/Behavioral:  Negative for dysphoric mood. The patient is not nervous/anxious.    Immunization History  Administered Date(s) Administered   Fluad Quad(high Dose 65+) 04/01/2019   Influenza Split 04/13/2011, 05/02/2012   Influenza Whole 05/22/2007, 04/12/2008, 05/02/2009, 04/14/2010   Influenza, High Dose Seasonal PF 04/17/2013, 03/16/2014, 03/05/2016, 03/18/2017   Influenza,inj,Quad PF,6+ Mos 03/02/2015, 03/18/2018   Influenza-Unspecified 03/18/2018   Moderna Sars-Covid-2 Vaccination 06/22/2019, 07/20/2019, 05/02/2020   PPD Test 01/02/2015   Pneumococcal Conjugate-13 12/29/2015   Pneumococcal Polysaccharide-23 04/19/2006  Tdap 09/04/2016   Zoster, Live 06/29/2015   Pertinent  Health Maintenance Due  Topic Date Due   URINE MICROALBUMIN  01/15/2020   FOOT EXAM  07/20/2020   INFLUENZA VACCINE  01/16/2021   HEMOGLOBIN A1C  04/23/2021   OPHTHALMOLOGY EXAM  08/31/2021   PNA vac Low Risk Adult  Completed   Fall Risk  12/07/2020 10/13/2020 08/23/2020 07/21/2019 01/15/2019  Falls in the past year? 1 1 0 0 0  Number falls in past yr: 1 1 0 0 0  Comment abrasions - - - -  Injury with Fall? 0 1 0 - -  Comment - left arm and knee pain - - -  Risk for fall due to : - - No Fall Risks - -  Follow up - Falls evaluation completed Falls evaluation completed - -  Comment - - - - -   Functional Status Survey:    Vitals:   12/21/20 1130  BP: (!) 151/90  Pulse: 93  Resp: (!) 24  Temp: 97.8 F (36.6 C)  SpO2: 95%  Weight: 169 lb (76.7 kg)  Height: _0  (1.803 m)   Body mass index is 23.57 kg/m. Physical Exam Vitals reviewed.  Constitutional:      General: He is not in acute distress. HENT:     Head: Normocephalic.  Cardiovascular:     Rate and Rhythm: Normal rate and regular rhythm.     Pulses: Normal pulses.     Heart  sounds: Normal heart sounds. No murmur heard. Pulmonary:     Effort: Pulmonary effort is normal. No respiratory distress.     Breath sounds: Examination of the right-middle field reveals decreased breath sounds. Examination of the left-middle field reveals decreased breath sounds. Decreased breath sounds present. No wheezing.  Musculoskeletal:     Right lower leg: No edema.     Left lower leg: No edema.  Lymphadenopathy:     Cervical: No cervical adenopathy.  Skin:    General: Skin is warm and dry.     Capillary Refill: Capillary refill takes less than 2 seconds.  Neurological:     General: No focal deficit present.     Mental Status: He is alert and oriented to person, place, and time.     Motor: Weakness present.     Gait: Gait abnormal.  Psychiatric:        Mood and Affect: Mood normal.        Behavior: Behavior normal.    Labs reviewed: Recent Labs    10/21/20 0259 10/22/20 0021 10/22/20 1222  NA 140 140 138  K 3.5 3.6 3.6  CL 108 110 107  CO2 _1 GLUCOSE 121* 110* 121*  BUN 28* 31* 24*  CREATININE 1.46* 1.73* 1.30*  CALCIUM 10.0 9.7 10.0   Recent Labs    01/19/20 1030 08/23/20 1154 10/20/20 1315  AST _2 ALT _3 ALKPHOS  --  36* 32*  BILITOT 0.6 0.5 0.4  PROT 6.8 6.9 6.3*  ALBUMIN  --  4.0 3.7   Recent Labs    01/19/20 1030 08/23/20 1154 10/20/20 1315  WBC 5.8 5.6 6.3  NEUTROABS 3,074 3.1 3.9  HGB 13.6 13.0 12.5*  HCT 40.3 38.5* 39.1  MCV 89.4 87.6 92.7  PLT 231 264.0 265   Lab Results  Component Value Date   TSH 2.21 07/03/2016   Lab Results  Component Value Date   HGBA1C 7.1 (H) 10/21/2020   Lab Results  Component Value Date   CHOL 135 10/21/2020   HDL 31 (L) 10/21/2020   LDLCALC 75 10/21/2020   LDLDIRECT 96.0 08/23/2020   TRIG 145 10/21/2020   CHOLHDL 4.4 10/21/2020    Significant Diagnostic Results in last 30 days:  No results found.  Assessment/Plan 1. Cough - dry cough started 07/04, fatigued, afebrile -  diminished lung sounds mid bases - CXR - cont robafen  2. Nasal congestion - clear congestion began today - covid pcr negative 07/05, plans to retest 07/07 - vitamin C 1000 mg daily x 10 days - zyrtec 10 mg po qhs x 10 days    Family/ staff Communication: plan discussed with patient and nurse  Labs/tests ordered:  CXR

## 2020-12-24 ENCOUNTER — Encounter (HOSPITAL_COMMUNITY): Payer: Self-pay | Admitting: Emergency Medicine

## 2020-12-24 ENCOUNTER — Other Ambulatory Visit: Payer: Self-pay

## 2020-12-24 ENCOUNTER — Emergency Department (HOSPITAL_COMMUNITY)
Admission: EM | Admit: 2020-12-24 | Discharge: 2020-12-24 | Disposition: A | Discharge status: Home / Self Care | Payer: Federal, State, Local not specified - PPO | Attending: Emergency Medicine | Admitting: Emergency Medicine

## 2020-12-24 ENCOUNTER — Emergency Department (HOSPITAL_COMMUNITY): Payer: Federal, State, Local not specified - PPO

## 2020-12-24 DIAGNOSIS — R0602 Shortness of breath: Secondary | ICD-10-CM | POA: Diagnosis present

## 2020-12-24 DIAGNOSIS — I2510 Atherosclerotic heart disease of native coronary artery without angina pectoris: Secondary | ICD-10-CM | POA: Diagnosis not present

## 2020-12-24 DIAGNOSIS — Z955 Presence of coronary angioplasty implant and graft: Secondary | ICD-10-CM | POA: Insufficient documentation

## 2020-12-24 DIAGNOSIS — Z96611 Presence of right artificial shoulder joint: Secondary | ICD-10-CM | POA: Diagnosis not present

## 2020-12-24 DIAGNOSIS — Z7984 Long term (current) use of oral hypoglycemic drugs: Secondary | ICD-10-CM | POA: Insufficient documentation

## 2020-12-24 DIAGNOSIS — R4182 Altered mental status, unspecified: Secondary | ICD-10-CM | POA: Insufficient documentation

## 2020-12-24 DIAGNOSIS — Z85828 Personal history of other malignant neoplasm of skin: Secondary | ICD-10-CM | POA: Insufficient documentation

## 2020-12-24 DIAGNOSIS — E119 Type 2 diabetes mellitus without complications: Secondary | ICD-10-CM | POA: Diagnosis not present

## 2020-12-24 DIAGNOSIS — N189 Chronic kidney disease, unspecified: Secondary | ICD-10-CM | POA: Insufficient documentation

## 2020-12-24 DIAGNOSIS — Z87891 Personal history of nicotine dependence: Secondary | ICD-10-CM | POA: Diagnosis not present

## 2020-12-24 DIAGNOSIS — I129 Hypertensive chronic kidney disease with stage 1 through stage 4 chronic kidney disease, or unspecified chronic kidney disease: Secondary | ICD-10-CM | POA: Insufficient documentation

## 2020-12-24 DIAGNOSIS — Z20822 Contact with and (suspected) exposure to covid-19: Secondary | ICD-10-CM | POA: Diagnosis not present

## 2020-12-24 DIAGNOSIS — J019 Acute sinusitis, unspecified: Secondary | ICD-10-CM

## 2020-12-24 LAB — CBC WITH DIFFERENTIAL/PLATELET
Abs Immature Granulocytes: 0.02 10*3/uL (ref 0.00–0.07)
Basophils Absolute: 0 10*3/uL (ref 0.0–0.1)
Basophils Relative: 0 %
Eosinophils Absolute: 0.1 10*3/uL (ref 0.0–0.5)
Eosinophils Relative: 1 %
HCT: 35.4 % — ABNORMAL LOW (ref 39.0–52.0)
Hemoglobin: 11.2 g/dL — ABNORMAL LOW (ref 13.0–17.0)
Immature Granulocytes: 0 %
Lymphocytes Relative: 14 %
Lymphs Abs: 0.7 10*3/uL (ref 0.7–4.0)
MCH: 28.6 pg (ref 26.0–34.0)
MCHC: 31.6 g/dL (ref 30.0–36.0)
MCV: 90.5 fL (ref 80.0–100.0)
Monocytes Absolute: 0.6 10*3/uL (ref 0.1–1.0)
Monocytes Relative: 10 %
Neutro Abs: 3.9 10*3/uL (ref 1.7–7.7)
Neutrophils Relative %: 75 %
Platelets: 234 10*3/uL (ref 150–400)
RBC: 3.91 MIL/uL — ABNORMAL LOW (ref 4.22–5.81)
RDW: 13.1 % (ref 11.5–15.5)
WBC: 5.3 10*3/uL (ref 4.0–10.5)
nRBC: 0 % (ref 0.0–0.2)

## 2020-12-24 LAB — COMPREHENSIVE METABOLIC PANEL
ALT: 24 U/L (ref 0–44)
AST: 34 U/L (ref 15–41)
Albumin: 3.4 g/dL — ABNORMAL LOW (ref 3.5–5.0)
Alkaline Phosphatase: 31 U/L — ABNORMAL LOW (ref 38–126)
Anion gap: 6 (ref 5–15)
BUN: 34 mg/dL — ABNORMAL HIGH (ref 8–23)
CO2: 25 mmol/L (ref 22–32)
Calcium: 10.2 mg/dL (ref 8.9–10.3)
Chloride: 108 mmol/L (ref 98–111)
Creatinine, Ser: 1.32 mg/dL — ABNORMAL HIGH (ref 0.61–1.24)
GFR, Estimated: 54 mL/min — ABNORMAL LOW (ref >60)
Glucose, Bld: 195 mg/dL — ABNORMAL HIGH (ref 70–99)
Potassium: 3.4 mmol/L — ABNORMAL LOW (ref 3.5–5.1)
Sodium: 139 mmol/L (ref 135–145)
Total Bilirubin: 0.7 mg/dL (ref 0.3–1.2)
Total Protein: 7 g/dL (ref 6.5–8.1)

## 2020-12-24 LAB — RESP PANEL BY RT-PCR (FLU A&B, COVID) ARPGX2
Influenza A by PCR: NEGATIVE
Influenza B by PCR: NEGATIVE
SARS Coronavirus 2 by RT PCR: NEGATIVE

## 2020-12-24 LAB — URINALYSIS, ROUTINE W REFLEX MICROSCOPIC
Bilirubin Urine: NEGATIVE
Glucose, UA: NEGATIVE mg/dL
Hgb urine dipstick: NEGATIVE
Ketones, ur: NEGATIVE mg/dL
Leukocytes,Ua: NEGATIVE
Nitrite: NEGATIVE
Protein, ur: NEGATIVE mg/dL
Specific Gravity, Urine: 1.017 (ref 1.005–1.030)
pH: 5 (ref 5.0–8.0)

## 2020-12-24 LAB — TROPONIN I (HIGH SENSITIVITY): Troponin I (High Sensitivity): 8 ng/L (ref <18)

## 2020-12-24 LAB — BRAIN NATRIURETIC PEPTIDE: B Natriuretic Peptide: 66.5 pg/mL (ref 0.0–100.0)

## 2020-12-24 MED ORDER — AMOXICILLIN-POT CLAVULANATE 875-125 MG PO TABS: 1.0000 | ORAL_TABLET | Freq: Two times a day (BID) | ORAL | 0 refills | Status: DC

## 2020-12-24 MED ORDER — FLUTICASONE PROPIONATE 50 MCG/ACT NA SUSP: 2.0000 | Freq: Every day | NASAL | 0 refills | Status: DC

## 2020-12-24 MED ORDER — SODIUM CHLORIDE 0.9 % IV SOLN
1.0000 g | Freq: Once | INTRAVENOUS | Status: AC
  Administered 2020-12-24: 1 g via INTRAVENOUS
  Filled 2020-12-24: qty 10

## 2020-12-24 MED ORDER — GUAIFENESIN ER 600 MG PO TB12: 600.0000 mg | ORAL_TABLET | Freq: Two times a day (BID) | ORAL | 0 refills | Status: AC

## 2020-12-24 NOTE — ED Provider Notes (Signed)
[place] [facility] DEPT Provider Note   CSN: [id number] Arrival date & time: 12/24/20  1133     History Chief Complaint  Patient presents with   Shortness of Breath   Cough    Juan [name] is a 85 y.o. male.  HPI  85 year old male with a history of skin cancer, CKD, CAD, diverticular disease, duodenitis, GERD, hyperlipidemia, hypertension, pancreatitis, pneumonia, syncope, TIA a, tubular adenoma, diabetes, who presents to the emergency department today for evaluation of increased work of breathing, decreased p.o. intake and increased fatigue.  Per daughter at bedside she states that the patient has not been complaining of shortness of breath but clinically he appears to have an increased respiratory rate and difficulty breathing.  She states he is more sleepy than normal and has not eaten in the last few days per the facility.  Patient denies any chest pain but does report some congestion and cough for the last few days.  He denies any nausea, vomiting, diarrhea, abdominal pain or constipation.  Past Medical History:  Diagnosis Date   Cancer ([place])    skin   CKD (chronic kidney disease) 06/24/2013   Cochlear implant in place    Coronary artery disease    a. s/p CABG 2000; b. myoview 9/08: inf-septal scar with mild peri-infarct ischemia (low risk);  c.  Echo 8/13: mild focal basal septal hypertrophy, Gr 1 DD, mild LAE;  d. Lexiscan Myoview (06/20/2013): No reversible ischemia, inferior and septal infarct, inferior and septal hypokinesis, EF 60%;  e.  Echo (06/20/2011): EF 50-55%, normal wall motion, grade 1 diastolic dysfunction, mild LAE   Diverticular disease    Duodenitis    Elevated PSA    GERD (gastroesophageal reflux disease)    Gout    Humerus fracture    right   Hydronephrosis with ureteropelvic junction obstruction    Hypercalcemia 02/02/2012   a. 01/2012: 10.7.   Hyperlipidemia    Hypertension    Pancreatitis    a. ? Simvastatin related   Pneumonia  age 8 or 7   "I think only once" (06/19/2013)   Syncope    a. 06/2013   TIA (transient ischemic attack)    a. 01/2012 - presentation as acute imbalance;  b. 01/2012 carotid U/S w/o signif stenosis;  c. 01/2012 Echo: EF 60-65%, Gr 1 DD.;  d.  Carotid US (06/20/2013): 1-39% bilateral ICA stenosis   Tubular adenoma polyp of rectum    Type II diabetes mellitus ([place])    "diet and exercise controlled at present" (06/19/2013)    Patient Active Problem List   Diagnosis Date Noted   Memory deficit 10/26/2020   HOH (hard of hearing) 10/26/2020   Recurrent falls 10/18/2020   Physical deconditioning 10/14/2020   Diabetes mellitus without complication ([place]) 36/46/8032   Mallet deformity of left middle finger 03/25/2017   Shuffling gait 12/29/2015   Benign prostatic hyperplasia with urinary obstruction 08/28/2015   Erectile dysfunction due to arterial insufficiency 08/28/2015   Inguinal hernia, right s/p repair with mesh 09/15/15 08/28/2015   Dysplastic nevus 09/05/2014   History of pancreatitis 06/25/2013   CKD (chronic kidney disease) 06/24/2013   TIA (transient ischemic attack) 02/07/2012   Essential hypertension, benign 03/02/2009   DIVERTICULAR DISEASE 03/01/2009   PERSONAL HX COLONIC POLYPS 03/19/2008   History of gout 12/09/2007   Coronary atherosclerosis 12/09/2007   Mixed hyperlipidemia 06/04/2007    Past Surgical History:  Procedure Laterality Date   APPENDECTOMY     CARDIAC CATHETERIZATION  2000   CHOLECYSTECTOMY     COCHLEAR IMPLANT Right 1990's   COLONOSCOPY W/ POLYPECTOMY     no polyp 2012   CORONARY ARTERY BYPASS GRAFT  2000   CABG X5   INGUINAL HERNIA REPAIR Right 09/15/2015   Procedure: OPEN REPAIR RIGHT INGUINAL HERNIA ;  Surgeon: Jackolyn Confer, MD;  Location: [facility];  Service: General;  Laterality: Right;   INSERTION OF MESH Right 09/15/2015   Procedure: INSERTION OF MESH;  Surgeon: Jackolyn Confer, MD;  Location: [facility];  Service: General;  Laterality: Right;    PARATHYROIDECTOMY  1978   POLYPECTOMY     REVERSE SHOULDER ARTHROPLASTY Right 12/30/2014   Procedure: Right shoulder ORIF, Biomet S3 Plate and ZIm[employer] cable system ;  Surgeon: Netta Cedars, MD;  Location: [facility];  Service: Orthopedics;  Laterality: Right;   right ankle surgery  age 58   TONSILLECTOMY  26   trachestomy as child for throat closing         Family History  Problem Relation Age of Onset   Diabetes Mother    Melanoma Father    Heart attack Father         in 84s    Social History   Tobacco Use   Smoking status: Former    Pack years: 0.00    Types: Cigars   Smokeless tobacco: Never   Tobacco comments:    06/19/2013 "rare cigar", 12/07/20 quit  Substance Use Topics   Alcohol use: Yes    Comment: 12/07/20 1-2 a week   Drug use: No    Home Medications Prior to Admission medications   Medication Sig Start Date End Date Taking? Authorizing Provider  acetaminophen (TYLENOL) 500 MG tablet Take 500 mg by mouth 2 (two) times daily as needed for mild pain.   Yes [provider]  allopurinol (ZYLOPRIM) 100 MG tablet Take 100 mg by mouth daily.   Yes [provider]  amoxicillin-clavulanate (AUGMENTIN) 875-125 MG tablet Take 1 tablet by mouth every 12 (twelve) hours. 12/24/20  Yes Koa Zoeller S, PA-C  Choline Fenofibrate (FENOFIBRIC ACID) 135 MG CPDR Take 1 tablet by mouth in the morning.   Yes [provider]  clopidogrel (PLAVIX) 75 MG tablet Take 1 tablet (75 mg total) by mouth daily. 10/25/20  Yes Eulogio Bear U, DO  colchicine 0.6 MG tablet Take 0.6 mg by mouth daily as needed (as directed for gout flares).   Yes [provider]  finasteride (PROSCAR) 5 MG tablet Take 5 mg by mouth every evening.   Yes [provider]  fluticasone (FLONASE) 50 MCG/ACT nasal spray Place 2 sprays into both nostrils daily. 12/24/20  Yes Jamika Sadek S, PA-C  guaiFENesin (MUCINEX) 600 MG 12 hr tablet Take 1 tablet (600 mg total) by mouth 2 (two)  times daily for 7 days. 12/24/20 12/31/20 Yes Santresa Levett S, PA-C  metFORMIN (GLUCOPHAGE) 500 MG tablet Take 500 mg by mouth in the morning.   Yes [provider]  pravastatin (PRAVACHOL) 20 MG tablet Take 20 mg by mouth daily. On MON, WED, FRI   Yes [provider]  blood glucose meter kit and supplies KIT Dispense based on patient and insurance preference. Use up to four times daily as directed. (FOR ICD-9 250.00, 250.01). 01/04/17   [place], FNP  Dextromethorphan-guaiFENesin 10-100 MG/5ML liquid Take 10 mLs by mouth every 6 (six) hours as needed.    [provider]    Allergies    Simvastatin  Review of Systems   Review of Systems  Constitutional:  Negative for chills and fever.  HENT:  Positive for congestion. Negative for ear pain and sore throat.   Eyes:  Negative for pain and visual disturbance.  Respiratory:  Positive for cough. Negative for shortness of breath.   Cardiovascular:  Negative for chest pain.  Gastrointestinal:  Negative for abdominal pain, constipation, diarrhea, nausea and vomiting.  Genitourinary:  Negative for dysuria and hematuria.  Musculoskeletal:  Negative for back pain.  Skin:  Negative for rash.  Neurological:  Negative for headaches.  All other systems reviewed and are negative.  Physical Exam Updated Vital Signs BP 133/79 (BP Location: Right Arm)   Pulse 96   Temp 98.3 F (36.8 C) (Oral)   Resp 17   SpO2 98%   Physical Exam Vitals and nursing note reviewed.  Constitutional:      Appearance: He is well-developed.  HENT:     Head: Normocephalic and atraumatic.     Nose: Congestion present.  Eyes:     Conjunctiva/sclera: Conjunctivae normal.  Cardiovascular:     Rate and Rhythm: Normal rate and regular rhythm.     Heart sounds: No murmur heard. Pulmonary:     Effort: Pulmonary effort is normal. Tachypnea present. No respiratory distress.     Breath sounds: Examination of the right-lower field reveals  decreased breath sounds and rales. Examination of the left-lower field reveals decreased breath sounds and rales. Decreased breath sounds and rales present.  Abdominal:     General: Bowel sounds are normal.     Palpations: Abdomen is soft.     Tenderness: There is no abdominal tenderness. There is no guarding or rebound.  Musculoskeletal:     Cervical back: Neck supple.  Skin:    General: Skin is warm and dry.  Neurological:     Mental Status: He is alert.     Comments: Sleepy but easily arousable to voice. Answers questions appropriately and follows commands.     ED Results / Procedures / Treatments   Labs (all labs ordered are listed, but only abnormal results are displayed) Labs Reviewed  CBC WITH DIFFERENTIAL/PLATELET - Abnormal; Notable for the following components:      Result Value   RBC 3.91 (*)    Hemoglobin 11.2 (*)    HCT 35.4 (*)    All other components within normal limits  COMPREHENSIVE METABOLIC PANEL - Abnormal; Notable for the following components:   Potassium 3.4 (*)    Glucose, Bld 195 (*)    BUN 34 (*)    Creatinine, Ser 1.32 (*)    Albumin 3.4 (*)    Alkaline Phosphatase 31 (*)    GFR, Estimated 54 (*)    All other components within normal limits  RESP PANEL BY RT-PCR (FLU A&B, COVID) ARPGX2  BRAIN NATRIURETIC PEPTIDE  URINALYSIS, ROUTINE W REFLEX MICROSCOPIC  TROPONIN I (HIGH SENSITIVITY)    EKG None       Radiology CT Head Wo Contrast  Result Date: 12/24/2020 CLINICAL DATA:  Altered mental status. EXAM: CT HEAD WITHOUT CONTRAST TECHNIQUE: Contiguous axial images were obtained from the base of the skull through the vertex without intravenous contrast. COMPARISON:  For head CT 10/20/2020. FINDINGS: Brain: No evidence of acute infarction, hemorrhage, hydrocephalus, extra-axial collection or mass lesion/mass effect. Extensive streak artifact from the patient's right cochlear implant is noted. Chronic microvascular ischemic change and mild cortical  atrophy are again seen. Vascular: No hyperdense vessel or unexpected calcification. Skull:  Intact.  No focal lesion. Sinuses/Orbits: Complete opacification of the right maxillary sinus is again seen. Ethmoid air cell disease is much worse than on the prior examination. There is a new air-fluid level in the left maxillary sinus. Mild mucosal thickening in the sphenoid sinuses has also progressed. Other: None. IMPRESSION: No acute intracranial abnormality. Extensive sinus disease is much worse than on the comparison head CT and includes a new air-fluid level in the left maxillary sinus consistent with acute sinusitis. Atrophy and chronic microvascular ischemic change. Electronically Signed   By: Inge Rise M.D.   On: 12/24/2020 13:02   DG Chest Portable 1 View  Result Date: 12/24/2020 CLINICAL DATA:  Shortness of breath and nonproductive cough for the past week. EXAM: PORTABLE CHEST 1 VIEW COMPARISON:  10/13/2020 FINDINGS: Normal sized heart. Stable post CABG changes. Stable linear scarring at the lung bases. Stable right proximal humerus fixation hardware. IMPRESSION: No acute abnormality. Electronically Signed   By: Claudie Revering M.D.   On: 12/24/2020 12:12    Procedures Procedures    12:21 PM Cardiac monitoring reveals NSR HR 90 (Rate & rhythm), as reviewed and interpreted by me. Cardiac monitoring was ordered due to sob and to monitor patient for dysrhythmia.  Medications Ordered in ED Medications  cefTRIAXone (ROCEPHIN) 1 g in sodium chloride 0.9 % 100 mL IVPB (0 g Intravenous Stopped 12/24/20 1432)    ED Course  I have reviewed the triage vital signs and the nursing notes.  Pertinent labs & imaging results that were available during my care of the patient were reviewed by me and considered in my medical decision making (see chart for details).    MDM Rules/Calculators/A&P                          85 y/o M presenting for eval of increased work of breathing, fatigue, decreased appetite.  Also with cough.   Reviewed/interpreted labs CBC with no leukocytosis, mild anemia CMP with mild hypokalemia, elevated cr which is baseline, otherwise reassuring Trop neg BNP neg COVID neg UA ua for uti  EKG with nsr, left anterior fascicular block  Reviewed/interpreted imaging CT head -  No acute intracranial abnormality. Extensive sinus disease is much worse than on the comparison head CT and includes a new air-fluid level in the left maxillary sinus consistent with acute sinusitis. Atrophy and chronic microvascular ischemic change CXR - No acute abnormality.  Pt with sinusitis, w/u is otherwise reassuring. He does not appear to require admission at this time. He was given a dose of rocephin in the ED. He is given rx for augmentin, fluticasone and mucinex. Have advised close pcp f/u and strict return precautions. Pt and his family at bedside voices understanding of the plan and reasons to return. All questions answered, pt stable for discharge.  Final Clinical Impression(s) / ED Diagnoses Final diagnoses:  Acute sinusitis, recurrence not specified, unspecified location    Rx / DC Orders ED Discharge Orders          Ordered    guaiFENesin (MUCINEX) 600 MG 12 hr tablet  2 times daily        12/24/20 1421    fluticasone (FLONASE) 50 MCG/ACT nasal spray  Daily        12/24/20 1421    amoxicillin-clavulanate (AUGMENTIN) 875-125 MG tablet  Every 12 hours        12/24/20 1421  Rodney Booze, PA-C 12/24/20 1644    Charlesetta Shanks, MD 01/08/21 1106

## 2020-12-24 NOTE — Discharge Instructions (Addendum)
You were given a prescription for antibiotics. Please take the antibiotic prescription fully.   You were also given fluticasone nasal spray and guaifenesin. Use as directed   Please follow up with your primary care provider within 5-7 days for re-evaluation of your symptoms. If you do not have a primary care provider, information for a healthcare clinic has been provided for you to make arrangements for follow up care. Please return to the emergency department for any new or worsening symptoms.

## 2020-12-24 NOTE — ED Provider Notes (Signed)
I provided a substantive portion of the care of this patient.  I personally performed the entirety of the history for this encounter.  EKG Interpretation  Date/Time:  Saturday December 24 2020 11:46:22 EDT Ventricular Rate:  90 PR Interval:  153 QRS Duration: 100 QT Interval:  362 QTC Calculation: 443 R Axis:   -50 Text Interpretation: Sinus rhythm Left anterior fascicular block Consider anterior infarct Confirmed by Dorie Rank [id number]) on 12/25/2020 4:28:42 PM   Patient has been sent from assisted living for increased difficulty breathing.  Also decreased oral intake and fatigue.  Patient denies that he feels short of breath.  He does report a lot of nasal congestion and drainage.  He denies any chest pain.  He has had some cough for several days  Patient is alert.  He does not have respiratory distress at rest.  He has pronounced nasal congestion and drainage.  Heart regular.  Lungs with slightly decreased breath sounds at the bases and occasional crackle.  CT shows extensive sinus disease with air-fluid levels.  Chest x-ray does not show any congestion.  Clinically patient is nontoxic and without significant respiratory distress.  Agree with plan of treating sinusitis with strict return precautions.   Charlesetta Shanks, MD 01/08/21 1106

## 2020-12-24 NOTE — ED Triage Notes (Signed)
From [facility], C/C SOB and non-productive cough x1 week, Duoneb w/o improvement this AM, EMS states he was 96% RA. Tested COVID negative 2 days ago.   20 L Hand, about 200 cc NS and 125 Solumedrol IV.

## 2020-12-26 ENCOUNTER — Non-Acute Institutional Stay: Payer: Federal, State, Local not specified - PPO | Admitting: Nurse Practitioner

## 2020-12-26 ENCOUNTER — Encounter: Payer: Self-pay | Admitting: Nurse Practitioner

## 2020-12-26 DIAGNOSIS — J0100 Acute maxillary sinusitis, unspecified: Secondary | ICD-10-CM | POA: Diagnosis not present

## 2020-12-26 DIAGNOSIS — H9191 Unspecified hearing loss, right ear: Secondary | ICD-10-CM

## 2020-12-26 DIAGNOSIS — E119 Type 2 diabetes mellitus without complications: Secondary | ICD-10-CM

## 2020-12-26 DIAGNOSIS — I10 Essential (primary) hypertension: Secondary | ICD-10-CM

## 2020-12-26 DIAGNOSIS — N1831 Chronic kidney disease, stage 3a: Secondary | ICD-10-CM

## 2020-12-26 DIAGNOSIS — R413 Other amnesia: Secondary | ICD-10-CM

## 2020-12-26 DIAGNOSIS — Z8739 Personal history of other diseases of the musculoskeletal system and connective tissue: Secondary | ICD-10-CM

## 2020-12-26 DIAGNOSIS — N138 Other obstructive and reflux uropathy: Secondary | ICD-10-CM

## 2020-12-26 DIAGNOSIS — G459 Transient cerebral ischemic attack, unspecified: Secondary | ICD-10-CM | POA: Diagnosis not present

## 2020-12-26 DIAGNOSIS — N401 Enlarged prostate with lower urinary tract symptoms: Secondary | ICD-10-CM

## 2020-12-26 DIAGNOSIS — E782 Mixed hyperlipidemia: Secondary | ICD-10-CM

## 2020-12-26 DIAGNOSIS — R296 Repeated falls: Secondary | ICD-10-CM | POA: Diagnosis not present

## 2020-12-26 DIAGNOSIS — I2510 Atherosclerotic heart disease of native coronary artery without angina pectoris: Secondary | ICD-10-CM

## 2020-12-26 DIAGNOSIS — R2689 Other abnormalities of gait and mobility: Secondary | ICD-10-CM

## 2020-12-26 NOTE — Progress Notes (Signed)
Location:   [facility] Room Number: FG18 Place of Service:  [facility] (13) Provider: Lennie Odor Moxon Messler NP  [name], NP  Patient Care Team: [name], NP as PCP - General (Adult Health Nurse Practitioner) [name], MD as PCP - Cardiology (Cardiology)  Extended Emergency Contact Information Primary Emergency Contact: [name],[name]          [place], [place] of [place] Phone: [phone] Mobile Phone: [phone] Relation: Daughter Secondary Emergency Contact: [name],[name] Address: [address]           [place], [place] [address] [name] of [place] Phone: [phone] Mobile Phone: [phone] Relation: Daughter  Code Status:  DNR Goals of care: Advanced Directive information Advanced Directives 12/26/2020  Does Patient Have a Medical Advance Directive? Yes  Type of Paramedic of [place];Living will  Does patient want to make changes to medical advance directive? No - Patient declined  Copy of [place] in Chart? Yes - validated most recent copy scanned in chart (See row information)  Would patient like information on creating a medical advance directive? -     Chief Complaint  Patient presents with   Hospitalization Follow-up    Follow up for emergency department evaluation    HPI:  Pt is a 85 y.o. male seen today for an acute visit for fall x2, ED eval for SOB, cough, congestion,  treated for URI sinusitis, with DuoNeb prn, Augmentin, Fluticasone, Guaifenesin. Had BNP, CBC/diff, CMP, Troponin, UA CT head acute sinusitis, CXR, EKG, unremarkable.   Falling, recurrent, no focal weakness, denied dizziness or change of vision.   CKD Bun/creat 34/1.32 eGFR 54 12/24/20  TIA/CVA,  on ASA 48m alone, f/u Neurology. 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 Small lacunar infarct.             Hx of Gout, takes Allopurinol, prn Colchicine,   venous [place] negative for DVT             HTN, off meds             Gait abnormality, shuffling.             T2DM Hgb a1c 7.1 10/21/20, takes Metformin             Recurrent falls, SNF [facility] for therapy.             CAD Echo no intracardiac source of embolism             Hyperlipidemia, on Pravachol, Fenofibric acid,  LDL 75 10/21/20             Memory issues f/u neurology             HOH, cochlear implant R             Urinary frequency, takes Finasteride,   Past Medical History:  Diagnosis Date   Cancer ([place]    skin   CKD (chronic kidney disease) 06/24/2013   Cochlear implant in place    Coronary artery disease    a. s/p CABG 2000; b. myoview 9/08: inf-septal scar with mild peri-infarct ischemia (low risk);  c.  Echo 8/13: mild focal basal septal hypertrophy, Gr 1 DD, mild LAE;  d. Lexiscan Myoview (06/20/2013): No reversible ischemia, inferior and septal infarct, inferior and septal hypokinesis, EF 60%;  e.  Echo (06/20/2011): EF 50-55%, normal wall motion, grade 1 diastolic dysfunction, mild  LAE   Diverticular disease    Duodenitis    Elevated PSA    GERD (gastroesophageal reflux disease)    Gout    Humerus fracture    right   Hydronephrosis with ureteropelvic junction obstruction    Hypercalcemia 02/02/2012   a. 01/2012: 10.7.   Hyperlipidemia    Hypertension    Pancreatitis    a. ? Simvastatin related   Pneumonia age 35 or 7   "I think only once" (06/19/2013)   Syncope    a. 06/2013   TIA (transient ischemic attack)    a. 01/2012 - presentation as acute imbalance;  b. 01/2012 carotid U/S w/o signif stenosis;  c. 01/2012 Echo: EF 60-65%, Gr 1 DD.;  d.  Carotid US (06/20/2013): 1-39% bilateral ICA stenosis   Tubular adenoma polyp of rectum    Type II diabetes mellitus ([place])    "diet and exercise controlled at present" (06/19/2013)   Past Surgical History:  Procedure Laterality Date   APPENDECTOMY     CARDIAC CATHETERIZATION  2000   CHOLECYSTECTOMY     COCHLEAR IMPLANT Right 1990's    COLONOSCOPY W/ POLYPECTOMY     no polyp 2012   CORONARY ARTERY BYPASS GRAFT  2000   CABG X5   INGUINAL HERNIA REPAIR Right 09/15/2015   Procedure: OPEN REPAIR RIGHT INGUINAL HERNIA ;  Surgeon: Jackolyn Confer, MD;  Location: [facility];  Service: General;  Laterality: Right;   INSERTION OF MESH Right 09/15/2015   Procedure: INSERTION OF MESH;  Surgeon: Jackolyn Confer, MD;  Location: [facility];  Service: General;  Laterality: Right;   PARATHYROIDECTOMY  1978   POLYPECTOMY     REVERSE SHOULDER ARTHROPLASTY Right 12/30/2014   Procedure: Right shoulder ORIF, Biomet S3 Plate and ZImmer cable system ;  Surgeon: Netta Cedars, MD;  Location: [facility];  Service: Orthopedics;  Laterality: Right;   right ankle surgery  age 46   TONSILLECTOMY  64   trachestomy as child for throat closing      Allergies  Allergen Reactions   Simvastatin Other (See Comments)    Possible statin-induced pancreatitis    Allergies as of 12/26/2020       Reactions   Simvastatin Other (See Comments)   Possible statin-induced pancreatitis        Medication List        Accurate as of December 26, 2020 11:59 PM. If you have any questions, ask your nurse or doctor.          STOP taking these medications    Dextromethorphan-guaiFENesin 10-100 MG/5ML liquid Stopped by: Daleigh Pollinger X Deboraha Goar, NP       TAKE these medications    acetaminophen 500 MG tablet Commonly known as: TYLENOL Take 500 mg by mouth 2 (two) times daily as needed for mild pain.   allopurinol 100 MG tablet Commonly known as: ZYLOPRIM Take 100 mg by mouth daily.   amoxicillin-clavulanate 875-125 MG tablet Commonly known as: AUGMENTIN Take 1 tablet by mouth every 12 (twelve) hours.   blood glucose meter kit and supplies Kit Dispense based on patient and insurance preference. Use up to four times daily as directed. (FOR ICD-9 250.00, 250.01).   cetirizine 10 MG tablet Commonly known as: ZYRTEC Take 10 mg by mouth daily.   clopidogrel 75 MG  tablet Commonly known as: PLAVIX Take 1 tablet (75 mg total) by mouth daily.   colchicine 0.6 MG tablet Take 0.6 mg by mouth daily as needed (as directed for gout flares).  Fenofibric Acid 135 MG Cpdr Take 1 tablet by mouth in the morning.   finasteride 5 MG tablet Commonly known as: PROSCAR Take 5 mg by mouth every evening.   fluticasone 50 MCG/ACT nasal spray Commonly known as: FLONASE Place 2 sprays into both nostrils daily.   guaiFENesin 600 MG 12 hr tablet Commonly known as: Mucinex Take 1 tablet (600 mg total) by mouth 2 (two) times daily for 7 days.   ipratropium-albuterol 0.5-2.5 (3) MG/3ML Soln Commonly known as: DUONEB Take 3 mLs by nebulization every 6 (six) hours as needed.   metFORMIN 500 MG tablet Commonly known as: GLUCOPHAGE Take 500 mg by mouth in the morning.   pravastatin 20 MG tablet Commonly known as: PRAVACHOL Take 20 mg by mouth daily. On MON, WED, FRI   vitamin C 1000 MG tablet Take 1,000 mg by mouth daily.        Review of Systems  Constitutional:  Positive for fatigue. Negative for appetite change and fever.  HENT:  Positive for congestion, hearing loss, rhinorrhea and sinus pressure. Negative for facial swelling, mouth sores, sinus pain, sore throat, trouble swallowing and voice change.        R cochlear implant  Eyes:  Negative for visual disturbance.  Respiratory:  Positive for cough and shortness of breath. Negative for wheezing.   Cardiovascular:  Negative for leg swelling.  Gastrointestinal:  Negative for abdominal pain, constipation, nausea and vomiting.       Last BM yesterday.   Genitourinary:  Negative for dysuria, frequency and urgency.       1-2x/night.   Musculoskeletal:  Positive for arthralgias, back pain and gait problem.       Lower back pain, left leg pain.   Skin:  Negative for color change.  Neurological:  Negative for speech difficulty, weakness and headaches.       Memory lapses.   Psychiatric/Behavioral:   Negative for behavioral problems and sleep disturbance. The patient is not nervous/anxious.    Immunization History  Administered Date(s) Administered   Fluad Quad(high Dose 65+) 04/01/2019   Influenza Split 04/13/2011, 05/02/2012   Influenza Whole 05/22/2007, 04/12/2008, 05/02/2009, 04/14/2010   Influenza, High Dose Seasonal PF 04/17/2013, 03/16/2014, 03/05/2016, 03/18/2017   Influenza,inj,Quad PF,6+ Mos 03/02/2015, 03/18/2018   Influenza-Unspecified 03/18/2018   Moderna Sars-Covid-2 Vaccination 06/22/2019, 07/20/2019, 05/02/2020, 11/23/2020   PPD Test 01/02/2015   Pneumococcal Conjugate-13 12/29/2015   Pneumococcal Polysaccharide-23 04/19/2006   Tdap 09/04/2016   Zoster, Live 06/29/2015   Pertinent  Health Maintenance Due  Topic Date Due   URINE MICROALBUMIN  01/15/2020   FOOT EXAM  07/20/2020   INFLUENZA VACCINE  01/16/2021   HEMOGLOBIN A1C  04/23/2021   OPHTHALMOLOGY EXAM  08/31/2021   PNA vac Low Risk Adult  Completed   Fall Risk  12/07/2020 10/13/2020 08/23/2020 07/21/2019 01/15/2019  Falls in the past year? 1 1 0 0 0  Number falls in past yr: 1 1 0 0 0  Comment abrasions - - - -  Injury with Fall? 0 1 0 - -  Comment - left arm and knee pain - - -  Risk for fall due to : - - No Fall Risks - -  Follow up - Falls evaluation completed Falls evaluation completed - -  Comment - - - - -   Functional Status Survey:    Vitals:   12/26/20 1224  BP: (!) 144/60  Pulse: (!) 108  Resp: (!) 24  Temp: (!) 97.3 F (36.3 C)  SpO2:  95%  Weight: 169 lb (76.7 kg)  Height: _0  (1.803 m)   Body mass index is 23.57 kg/m. Physical Exam Vitals and nursing note reviewed.  Constitutional:      Comments: Appears tired.   HENT:     Head: Normocephalic and atraumatic.     Nose: Congestion and rhinorrhea present.     Comments: No facial pain when pressed.     Mouth/Throat:     Mouth: Mucous membranes are moist.     Pharynx: No oropharyngeal exudate or posterior oropharyngeal  erythema.  Eyes:     Extraocular Movements: Extraocular movements intact.     Conjunctiva/sclera: Conjunctivae normal.     Pupils: Pupils are equal, round, and reactive to light.  Cardiovascular:     Rate and Rhythm: Normal rate and regular rhythm.     Heart sounds: No murmur heard. Pulmonary:     Effort: Pulmonary effort is normal.     Breath sounds: No wheezing, rhonchi or rales.  Abdominal:     General: Bowel sounds are normal.     Palpations: Abdomen is soft.     Tenderness: There is no abdominal tenderness.  Musculoskeletal:        General: Normal range of motion.     Cervical back: Normal range of motion and neck supple.     Right lower leg: No edema.     Left lower leg: No edema.  Skin:    General: Skin is warm and dry.  Neurological:     General: No focal deficit present.     Mental Status: He is alert and oriented to person, place, and time. Mental status is at baseline.     Motor: No weakness.     Coordination: Coordination normal.     Gait: Gait abnormal.  Psychiatric:        Mood and Affect: Mood normal.        Behavior: Behavior normal.        Thought Content: Thought content normal.    Labs reviewed: Recent Labs    10/22/20 0021 10/22/20 1222 12/24/20 1224  NA 140 138 139  K 3.6 3.6 3.4*  CL 110 107 108  CO2 _1 GLUCOSE 110* 121* 195*  BUN 31* 24* 34*  CREATININE 1.73* 1.30* 1.32*  CALCIUM 9.7 10.0 10.2   Recent Labs    08/23/20 1154 10/20/20 1315 12/24/20 1224  AST 22 17 34  ALT _2 ALKPHOS 36* 32* 31*  BILITOT 0.5 0.4 0.7  PROT 6.9 6.3* 7.0  ALBUMIN 4.0 3.7 3.4*   Recent Labs    08/23/20 1154 10/20/20 1315 12/24/20 1224  WBC 5.6 6.3 5.3  NEUTROABS 3.1 3.9 3.9  HGB 13.0 12.5* 11.2*  HCT 38.5* 39.1 35.4*  MCV 87.6 92.7 90.5  PLT 264.0 265 234   Lab Results  Component Value Date   TSH 2.21 07/03/2016   Lab Results  Component Value Date   HGBA1C 7.1 (H) 10/21/2020   Lab Results  Component Value Date   CHOL 135  10/21/2020   HDL 31 (L) 10/21/2020   LDLCALC 75 10/21/2020   LDLDIRECT 96.0 08/23/2020   TRIG 145 10/21/2020   CHOLHDL 4.4 10/21/2020    Significant Diagnostic Results in last 30 days:  CT Head Wo Contrast  Result Date: 12/24/2020 CLINICAL DATA:  Altered mental status. EXAM: CT HEAD WITHOUT CONTRAST TECHNIQUE: Contiguous axial images were obtained from the base of the skull through the vertex without intravenous  contrast. COMPARISON:  For head CT 10/20/2020. FINDINGS: Brain: No evidence of acute infarction, hemorrhage, hydrocephalus, extra-axial collection or mass lesion/mass effect. Extensive streak artifact from the patient's right cochlear implant is noted. Chronic microvascular ischemic change and mild cortical atrophy are again seen. Vascular: No hyperdense vessel or unexpected calcification. Skull: Intact.  No focal lesion. Sinuses/Orbits: Complete opacification of the right maxillary sinus is again seen. Ethmoid air cell disease is much worse than on the prior examination. There is a new air-fluid level in the left maxillary sinus. Mild mucosal thickening in the sphenoid sinuses has also progressed. Other: None. IMPRESSION: No acute intracranial abnormality. Extensive sinus disease is much worse than on the comparison head CT and includes a new air-fluid level in the left maxillary sinus consistent with acute sinusitis. Atrophy and chronic microvascular ischemic change. Electronically Signed   By: Inge Rise M.D.   On: 12/24/2020 13:02   DG Chest Portable 1 View  Result Date: 12/24/2020 CLINICAL DATA:  Shortness of breath and nonproductive cough for the past week. EXAM: PORTABLE CHEST 1 VIEW COMPARISON:  10/13/2020 FINDINGS: Normal sized heart. Stable post CABG changes. Stable linear scarring at the lung bases. Stable right proximal humerus fixation hardware. IMPRESSION: No acute abnormality. Electronically Signed   By: Claudie Revering M.D.   On: 12/24/2020 12:12    Assessment/Plan Acute  sinusitis all x2, ED eval for SOB, cough, congestion,  treated for URI sinusitis, with DuoNeb prn, Augmentin, Fluticasone, Guaifenesin. Had BNP, CBC/diff, CMP, Troponin, UA CT head acute sinusitis, CXR, EKG, unremarkable.   Recurrent falls recurrent, no focal weakness, denied dizziness or change of vision. May need higher level of care, neurology f/u. Hx of imbalance per note back 2013, shuffling gait, memory deficit, acute sinusitis are contributory to falling. Close supervision for safety. Ortho Bp   CKD (chronic kidney disease) Bun/creat 34/1.32 eGFR 54 12/24/20  TIA (transient ischemic attack) TIA/CVA,  on ASA 80m alone, f/u Neurology. 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 Small lacunar infarct. (02/02/12 presentation as acute imbalance without other neurologic or cardiac symptoms. Negative CT of the brain 8/17 and 8/18. Full dose aspirin initiated. He had been on 81 mg of aspirin for years)  History of gout Stable, takes Allopurinol, prn Colchicine,  venous [place] negative for DVT  Essential hypertension, benign Blood pressure is controlled, off meds.   Shuffling gait Chronic.   Diabetes mellitus without complication ([place] Hgb aM0N7.1 10/21/20, takes Metformin  Coronary atherosclerosis Echo no intracardiac source of embolism  Mixed hyperlipidemia on Pravachol, Fenofibric acid,  LDL 75 10/21/20  Memory deficit F/u neurology, needs higher level of care.   Benign prostatic hyperplasia with urinary obstruction Urinary frequency, takes Finasteride.   HOH (hard of hearing)  R cochlear implant.     Family/ staff Communication: plan of care reviewed with the patient and charge nurse.   Labs/tests ordered:  none  Time spend 40 minutes.

## 2020-12-27 ENCOUNTER — Encounter: Payer: Self-pay | Admitting: Nurse Practitioner

## 2020-12-27 ENCOUNTER — Encounter: Payer: Self-pay | Admitting: Orthopedic Surgery

## 2020-12-27 DIAGNOSIS — J310 Chronic rhinitis: Secondary | ICD-10-CM | POA: Insufficient documentation

## 2020-12-27 DIAGNOSIS — J019 Acute sinusitis, unspecified: Secondary | ICD-10-CM | POA: Insufficient documentation

## 2020-12-27 NOTE — Assessment & Plan Note (Addendum)
TIA/CVA,  on ASA 81mg  alone, f/u Neurology. 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 Small lacunar infarct. (02/02/12 presentation as acute imbalance without other neurologic or cardiac symptoms. Negative CT of the brain 8/17 and 8/18. Full dose aspirin initiated. He had been on 81 mg of aspirin for years)

## 2020-12-27 NOTE — Assessment & Plan Note (Signed)
Chronic. 

## 2020-12-27 NOTE — Assessment & Plan Note (Signed)
R cochlear implant.

## 2020-12-27 NOTE — Assessment & Plan Note (Signed)
F/u neurology, needs higher level of care.

## 2020-12-27 NOTE — Assessment & Plan Note (Signed)
Bun/creat 34/1.32 eGFR 54 12/24/20

## 2020-12-27 NOTE — Assessment & Plan Note (Signed)
Blood pressure is controlled, off meds.

## 2020-12-27 NOTE — Assessment & Plan Note (Signed)
Echo no intracardiac source of embolism

## 2020-12-27 NOTE — Assessment & Plan Note (Signed)
on Pravachol, Fenofibric acid,  LDL 75 10/21/20

## 2020-12-27 NOTE — Assessment & Plan Note (Signed)
Stable, takes Allopurinol, prn Colchicine,  venous US was negative for DVT

## 2020-12-27 NOTE — Assessment & Plan Note (Signed)
Hgb a1c 7.1 10/21/20, takes Metformin

## 2020-12-27 NOTE — Assessment & Plan Note (Addendum)
recurrent, no focal weakness, denied dizziness or change of vision. May need higher level of care, neurology f/u. Hx of imbalance per note back 2013, shuffling gait, memory deficit, acute sinusitis are contributory to falling. Close supervision for safety. Ortho Bp

## 2020-12-27 NOTE — Assessment & Plan Note (Signed)
all x2, ED eval for SOB, cough, congestion,  treated for URI sinusitis, with DuoNeb prn, Augmentin, Fluticasone, Guaifenesin. Had BNP, CBC/diff, CMP, Troponin, UA CT head acute sinusitis, CXR, EKG, unremarkable.

## 2020-12-27 NOTE — Assessment & Plan Note (Signed)
Urinary frequency, takes Finasteride.

## 2020-12-28 ENCOUNTER — Encounter: Payer: Self-pay | Admitting: Nurse Practitioner

## 2020-12-28 ENCOUNTER — Non-Acute Institutional Stay: Payer: Federal, State, Local not specified - PPO | Admitting: Nurse Practitioner

## 2020-12-28 DIAGNOSIS — N138 Other obstructive and reflux uropathy: Secondary | ICD-10-CM

## 2020-12-28 DIAGNOSIS — E119 Type 2 diabetes mellitus without complications: Secondary | ICD-10-CM

## 2020-12-28 DIAGNOSIS — R195 Other fecal abnormalities: Secondary | ICD-10-CM | POA: Insufficient documentation

## 2020-12-28 DIAGNOSIS — I2510 Atherosclerotic heart disease of native coronary artery without angina pectoris: Secondary | ICD-10-CM

## 2020-12-28 DIAGNOSIS — G459 Transient cerebral ischemic attack, unspecified: Secondary | ICD-10-CM

## 2020-12-28 DIAGNOSIS — R2689 Other abnormalities of gait and mobility: Secondary | ICD-10-CM

## 2020-12-28 DIAGNOSIS — E782 Mixed hyperlipidemia: Secondary | ICD-10-CM

## 2020-12-28 DIAGNOSIS — R296 Repeated falls: Secondary | ICD-10-CM | POA: Diagnosis not present

## 2020-12-28 DIAGNOSIS — Z8739 Personal history of other diseases of the musculoskeletal system and connective tissue: Secondary | ICD-10-CM

## 2020-12-28 DIAGNOSIS — N401 Enlarged prostate with lower urinary tract symptoms: Secondary | ICD-10-CM

## 2020-12-28 DIAGNOSIS — N1831 Chronic kidney disease, stage 3a: Secondary | ICD-10-CM | POA: Diagnosis not present

## 2020-12-28 DIAGNOSIS — R413 Other amnesia: Secondary | ICD-10-CM

## 2020-12-28 DIAGNOSIS — J0100 Acute maxillary sinusitis, unspecified: Secondary | ICD-10-CM

## 2020-12-28 DIAGNOSIS — I10 Essential (primary) hypertension: Secondary | ICD-10-CM

## 2020-12-28 NOTE — Assessment & Plan Note (Addendum)
Falling, recurrent, no focal weakness, denied dizziness or change of vision. checking for orthostatic bp changes. Long standing gait issue, increased frailty, lack of safety awareness are contributory, recommend SNF [facility] for care needs and safety.

## 2020-12-28 NOTE — Progress Notes (Signed)
Location:   [place] Room Number: XA12 Place of Service:  SNF (31) Provider: [facility] Casandra Dallaire NP  [name], NP  Patient Care Team: [name], NP as PCP - General (Adult Health Nurse Practitioner) [name], MD as PCP - Cardiology (Cardiology)  Extended Emergency Contact Information Primary Emergency Contact: [name],[name]          [place], [place] Phone: [phone] Mobile Phone: [phone] Relation: Daughter Secondary Emergency Contact: [name],[name] Address: [address]           [place], [place] [address] [name] of [place] Phone: [phone] Mobile Phone: [phone] Relation: Daughter  Code Status:  DNR Goals of care: Advanced Directive information Advanced Directives 12/28/2020  Does Patient Have a Medical Advance Directive? Yes  Type of Paramedic of [place];Living will  Does patient want to make changes to medical advance directive? No - Patient declined  Copy of [place] in Chart? Yes - validated most recent copy scanned in chart (See row information)  Would patient like information on creating a medical advance directive? -     Chief Complaint  Patient presents with   Acute Visit    Patient complains of loose stools     HPI:  Pt is a 85 y.o. male seen today for an acute visit for loose stools x 2 days. Denied abd pain, nausea, vomiting, or fever.  Sinusitis, on DuoNeb prn, Augmentin x 7 days started 12/24/20, taking Fluticasone, Guaifenesin. CT head acute sinusitis             Falling, recurrent, no focal weakness, denied dizziness or change of vision. checking for orthostatic bp changes.              CKD Bun/creat 34/1.32 eGFR 54 12/24/20             TIA/CVA,  on ASA 46m alone, f/u Neurology. 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 Small lacunar infarct.             Hx of Gout, takes Allopurinol,  prn Colchicine,  venous [place] negative for DVT             HTN, off meds             Gait abnormality, shuffling.             T2DM Hgb a1c 7.1 10/21/20, takes Metformin             Recurrent falls since 2013             CAD Echo no intracardiac source of embolism             Hyperlipidemia, on Pravachol, Fenofibric acid,  LDL 75 10/21/20             Memory issues f/u neurology             HOH, cochlear implant R             Urinary frequency, takes Finasteride,     Past Medical History:  Diagnosis Date   [place]cer [place]    skin   CKD (chronic kidney disease) 06/24/2013   Cochlear implant in place    Coronary artery disease    a. s/p CABG 2000; b. myoview 9/08: inf-septal scar with mild peri-infarct ischemia (low risk);  c.  Echo 8/13: mild focal basal septal hypertrophy, Gr 1 DD,  mild LAE;  d. [place] [facility] Myoview (06/20/2013): No reversible ischemia, inferior and septal infarct, inferior and septal hypokinesis, EF 60%;  e.  Echo (06/20/2011): EF 50-55%, normal wall motion, grade 1 diastolic dysfunction, mild LAE   Diverticular disease    Duodenitis    Elevated PSA    GERD (gastroesophageal reflux disease)    Gout    Humerus fracture    right   Hydronephrosis with ureteropelvic junction obstruction    Hypercalcemia 02/02/2012   a. 01/2012: 10.7.   Hyperlipidemia    Hypertension    Pancreatitis    a. ? Simvastatin related   Pneumonia age 80 or 7   "I think only once" (06/19/2013)   Syncope    a. 06/2013   TIA (transient ischemic attack)    a. 01/2012 - presentation as acute imbalance;  b. 01/2012 carotid U/S w/o signif stenosis;  c. 01/2012 Echo: EF 60-65%, Gr 1 DD.;  d.  Carotid US (06/20/2013): 1-39% bilateral ICA stenosis   Tubular adenoma polyp of rectum    Type II diabetes mellitus ([place])    "diet and exercise controlled at present" (06/19/2013)   Past Surgical History:  Procedure Laterality Date   APPENDECTOMY     CARDIAC CATHETERIZATION  2000   CHOLECYSTECTOMY     COCHLEAR IMPLANT Right  1990's   COLONOSCOPY W/ POLYPECTOMY     no polyp 2012   CORONARY ARTERY BYPASS GRAFT  2000   CABG X5   INGUINAL HERNIA REPAIR Right 09/15/2015   Procedure: OPEN REPAIR RIGHT INGUINAL HERNIA ;  Surgeon: Jackolyn Confer, MD;  Location: [facility];  Service: General;  Laterality: Right;   INSERTION OF MESH Right 09/15/2015   Procedure: INSERTION OF MESH;  Surgeon: Jackolyn Confer, MD;  Location: [facility];  Service: General;  Laterality: Right;   PARATHYROIDECTOMY  1978   POLYPECTOMY     REVERSE SHOULDER ARTHROPLASTY Right 12/30/2014   Procedure: Right shoulder ORIF, Biomet S3 Plate and ZImmer cable system ;  Surgeon: Netta Cedars, MD;  Location: [place];  Service: Orthopedics;  Laterality: Right;   right ankle surgery  age 65   TONSILLECTOMY  53   trachestomy as child for throat closing      Allergies  Allergen Reactions   Simvastatin Other (See Comments)    Possible statin-induced pancreatitis    Allergies as of 12/28/2020       Reactions   Simvastatin Other (See Comments)   Possible statin-induced pancreatitis        Medication List        Accurate as of December 28, 2020  1:23 PM. If you have any questions, ask your nurse or doctor.          acetaminophen 500 MG tablet Commonly known as: TYLENOL Take 500 mg by mouth 2 (two) times daily as needed for mild pain.   allopurinol 100 MG tablet Commonly known as: ZYLOPRIM Take 100 mg by mouth daily.   amoxicillin-clavulanate 875-125 MG tablet Commonly known as: AUGMENTIN Take 1 tablet by mouth every 12 (twelve) hours.   blood glucose meter kit and supplies Kit Dispense based on patient and insurance preference. Use up to four times daily as directed. (FOR ICD-9 250.00, 250.01).   cetirizine 10 MG tablet Commonly known as: ZYRTEC Take 10 mg by mouth daily.   clopidogrel 75 MG tablet Commonly known as: PLAVIX Take 1 tablet (75 mg total) by mouth daily.   colchicine 0.6 MG tablet Take 0.6 mg by mouth daily as needed (  as  directed for gout flares).   Fenofibric Acid 135 MG Cpdr Take 1 tablet by mouth in the morning.   finasteride 5 MG tablet Commonly known as: PROSCAR Take 5 mg by mouth every evening.   fluticasone 50 MCG/ACT nasal spray Commonly known as: FLONASE Place 2 sprays into both nostrils daily.   guaiFENesin 600 MG 12 hr tablet Commonly known as: Mucinex Take 1 tablet (600 mg total) by mouth 2 (two) times daily for 7 days.   ipratropium-albuterol 0.5-2.5 (3) MG/3ML Soln Commonly known as: DUONEB Take 3 mLs by nebulization every 6 (six) hours as needed.   metFORMIN 500 MG tablet Commonly known as: GLUCOPHAGE Take 500 mg by mouth in the morning.   pravastatin 20 MG tablet Commonly known as: PRAVACHOL Take 20 mg by mouth daily. On MON, WED, FRI   vitamin C 1000 MG tablet Take 1,000 mg by mouth daily.        Review of Systems  Constitutional:  Positive for fatigue. Negative for appetite change and fever.  HENT:  Positive for congestion, hearing loss, rhinorrhea and sinus pressure. Negative for facial swelling, mouth sores, sinus pain, sore throat, trouble swallowing and voice change.        R cochlear implant  Eyes:  Negative for visual disturbance.  Respiratory:  Positive for cough and shortness of breath. Negative for wheezing.   Cardiovascular:  Negative for leg swelling.  Gastrointestinal:  Positive for diarrhea. Negative for abdominal pain, nausea and vomiting.       Loose stools.   Genitourinary:  Negative for dysuria, frequency and urgency.       1-2x/night.   Musculoskeletal:  Positive for arthralgias, back pain and gait problem.       Lower back pain, left leg pain.   Skin:  Negative for color change.  Neurological:  Negative for speech difficulty, weakness and headaches.       Memory lapses.   Psychiatric/Behavioral:  Negative for behavioral problems and sleep disturbance. The patient is not nervous/anxious.    Immunization History  Administered Date(s)  Administered   Fluad Quad(high Dose 65+) 04/01/2019   Influenza Split 04/13/2011, 05/02/2012   Influenza Whole 05/22/2007, 04/12/2008, 05/02/2009, 04/14/2010   Influenza, High Dose Seasonal PF 04/17/2013, 03/16/2014, 03/05/2016, 03/18/2017   Influenza,inj,Quad PF,6+ Mos 03/02/2015, 03/18/2018   Influenza-Unspecified 03/18/2018   Moderna Sars-Covid-2 Vaccination 06/22/2019, 07/20/2019, 05/02/2020, 11/23/2020   PPD Test 01/02/2015   Pneumococcal Conjugate-13 12/29/2015   Pneumococcal Polysaccharide-23 04/19/2006   Tdap 09/04/2016   Zoster, Live 06/29/2015   Pertinent  Health Maintenance Due  Topic Date Due   URINE MICROALBUMIN  01/15/2020   FOOT EXAM  07/20/2020   INFLUENZA VACCINE  01/16/2021   HEMOGLOBIN A1C  04/23/2021   OPHTHALMOLOGY EXAM  08/31/2021   PNA vac Low Risk Adult  Completed   Fall Risk  12/07/2020 10/13/2020 08/23/2020 07/21/2019 01/15/2019  Falls in the past year? 1 1 0 0 0  Number falls in past yr: 1 1 0 0 0  Comment abrasions - - - -  Injury with Fall? 0 1 0 - -  Comment - left arm and knee pain - - -  Risk for fall due to : - - No Fall Risks - -  Follow up - Falls evaluation completed Falls evaluation completed - -  Comment - - - - -   Functional Status Survey:    Vitals:   12/28/20 1044  BP: 125/69  Pulse: 88  Resp: (!) 24  Temp:  97.7 F (36.5 C)  SpO2: 95%  Weight: 169 lb (76.7 kg)  Height: '5\' 11"'  (1.803 m)   Body mass index is 23.57 kg/m. Physical Exam Vitals and nursing note reviewed.  Constitutional:      Comments: Appears tired.   HENT:     Head: Normocephalic and atraumatic.     Nose: Congestion present. No rhinorrhea.     Comments: No facial pain when pressed.     Mouth/Throat:     Mouth: Mucous membranes are moist.     Pharynx: No oropharyngeal exudate or posterior oropharyngeal erythema.  Eyes:     Extraocular Movements: Extraocular movements intact.     Conjunctiva/sclera: Conjunctivae normal.     Pupils: Pupils are equal, round,  and reactive to light.  Cardiovascular:     Rate and Rhythm: Normal rate and regular rhythm.     Heart sounds: No murmur heard. Pulmonary:     Effort: Pulmonary effort is normal.     Breath sounds: No wheezing, rhonchi or rales.  Abdominal:     General: Bowel sounds are normal.     Palpations: Abdomen is soft.     Tenderness: There is no abdominal tenderness. There is no right CVA tenderness, left CVA tenderness, guarding or rebound.  Musculoskeletal:        General: Normal range of motion.     Cervical back: Normal range of motion and neck supple.     Right lower leg: No edema.     Left lower leg: No edema.  Skin:    General: Skin is warm and dry.  Neurological:     General: No focal deficit present.     Mental Status: He is alert and oriented to person, place, and time. Mental status is at baseline.     Motor: No weakness.     Coordination: Coordination normal.     Gait: Gait abnormal.  Psychiatric:        Mood and Affect: Mood normal.        Behavior: Behavior normal.        Thought Content: Thought content normal.    Labs reviewed: Recent Labs    10/22/20 0021 10/22/20 1222 12/24/20 1224  NA 140 138 139  K 3.6 3.6 3.4*  CL 110 107 108  CO2 '24 24 25  ' GLUCOSE 110* 121* 195*  BUN 31* 24* 34*  CREATININE 1.73* 1.30* 1.32*  CALCIUM 9.7 10.0 10.2   Recent Labs    08/23/20 1154 10/20/20 1315 12/24/20 1224  AST 22 17 34  ALT '19 14 24  ' ALKPHOS 36* 32* 31*  BILITOT 0.5 0.4 0.7  PROT 6.9 6.3* 7.0  ALBUMIN 4.0 3.7 3.4*   Recent Labs    08/23/20 1154 10/20/20 1315 12/24/20 1224  WBC 5.6 6.3 5.3  NEUTROABS 3.1 3.9 3.9  HGB 13.0 12.5* 11.2*  HCT 38.5* 39.1 35.4*  MCV 87.6 92.7 90.5  PLT 264.0 265 234   Lab Results  Component Value Date   TSH 2.21 07/03/2016   Lab Results  Component Value Date   HGBA1C 7.1 (H) 10/21/2020   Lab Results  Component Value Date   CHOL 135 10/21/2020   HDL 31 (L) 10/21/2020   LDLCALC 75 10/21/2020   LDLDIRECT 96.0  08/23/2020   TRIG 145 10/21/2020   CHOLHDL 4.4 10/21/2020    Significant Diagnostic Results in last 30 days:  CT Head Wo Contrast  Result Date: 12/24/2020 CLINICAL DATA:  Altered mental status. EXAM: CT HEAD WITHOUT  CONTRAST TECHNIQUE: Contiguous axial images were obtained from the base of the skull through the vertex without intravenous contrast. COMPARISON:  For head CT 10/20/2020. FINDINGS: Brain: No evidence of acute infarction, hemorrhage, hydrocephalus, extra-axial collection or mass lesion/mass effect. Extensive streak artifact from the patient's right cochlear implant is noted. Chronic microvascular ischemic change and mild cortical atrophy are again seen. Vascular: No hyperdense vessel or unexpected calcification. Skull: Intact.  No focal lesion. Sinuses/Orbits: Complete opacification of the right maxillary sinus is again seen. Ethmoid air cell disease is much worse than on the prior examination. There is a new air-fluid level in the left maxillary sinus. Mild mucosal thickening in the sphenoid sinuses has also progressed. Other: None. IMPRESSION: No acute intracranial abnormality. Extensive sinus disease is much worse than on the comparison head CT and includes a new air-fluid level in the left maxillary sinus consistent with acute sinusitis. Atrophy and chronic microvascular ischemic change. Electronically Signed   By: Inge Rise M.D.   On: 12/24/2020 13:02   DG Chest Portable 1 View  Result Date: 12/24/2020 CLINICAL DATA:  Shortness of breath and nonproductive cough for the past week. EXAM: PORTABLE CHEST 1 VIEW COMPARISON:  10/13/2020 FINDINGS: Normal sized heart. Stable post CABG changes. Stable linear scarring at the lung bases. Stable right proximal humerus fixation hardware. IMPRESSION: No acute abnormality. Electronically Signed   By: Claudie Revering M.D.   On: 12/24/2020 12:12    Assessment/Plan Loose stools loose stools x 2 days. Denied abd pain, nausea, vomiting, or fever. Prn  Imodium for symptomatic management, update CBC/diff, CMP/eGFR, may stool C-diff toxin A/B of diarrhea persists, adding Florastor bid x 7 days.   Acute sinusitis Sinusitis, on DuoNeb prn, Augmentin x 7 days started 12/24/20, taking Fluticasone, Guaifenesin. CT head acute sinusitis  Recurrent falls Falling, recurrent, no focal weakness, denied dizziness or change of vision. checking for orthostatic bp changes. Long standing gait issue, increased frailty, lack of safety awareness are contributory, recommend SNF [facility] for care needs and safety.   CKD (chronic kidney disease) Bun/creat 34/1.32 eGFR 54 12/24/20  TIA (transient ischemic attack) on ASA 17m alone, f/u Neurology. 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 Small lacunar infarct.  History of gout akes Allopurinol, prn Colchicine,  venous [place] negative for DVT  Essential hypertension, benign Controlled, no meds.   Shuffling gait Risk of falling.   Diabetes mellitus without complication ([place] Hgb aB3U7.1 10/21/20, takes Metformin  Coronary atherosclerosis Echo no intracardiac source of embolism  Mixed hyperlipidemia on Pravachol, Fenofibric acid,  LDL 75 10/21/20  Memory deficit F/u neurology  Benign prostatic hyperplasia with urinary obstruction Chronic, takes Finasteride.     Family/ staff Communication: plan of care reviewed with the patient and charge nurse.   Labs/tests ordered:  CBC/diff, CMP/eGFR, C-diff toxin A/B x3  Time spend 40 minutes.

## 2020-12-28 NOTE — Assessment & Plan Note (Signed)
Echo no intracardiac source of embolism

## 2020-12-28 NOTE — Assessment & Plan Note (Signed)
F/u neurology 

## 2020-12-28 NOTE — Assessment & Plan Note (Signed)
Risk of falling.

## 2020-12-28 NOTE — Assessment & Plan Note (Signed)
akes Allopurinol, prn Colchicine,  venous US was negative for DVT

## 2020-12-28 NOTE — Assessment & Plan Note (Signed)
Chronic, takes Finasteride.

## 2020-12-28 NOTE — Assessment & Plan Note (Signed)
on ASA 81mg  alone, f/u Neurology. 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 Small lacunar infarct.

## 2020-12-28 NOTE — Telephone Encounter (Signed)
Message forwarded to [facility] to address.

## 2020-12-28 NOTE — Assessment & Plan Note (Signed)
Sinusitis, on DuoNeb prn, Augmentin x 7 days started 12/24/20, taking Fluticasone, Guaifenesin. CT head acute sinusitis

## 2020-12-28 NOTE — Assessment & Plan Note (Signed)
Controlled, no meds.

## 2020-12-28 NOTE — Assessment & Plan Note (Signed)
Bun/creat 34/1.32 eGFR 54 12/24/20

## 2020-12-28 NOTE — Assessment & Plan Note (Signed)
Hgb a1c 7.1 10/21/20, takes Metformin

## 2020-12-28 NOTE — Assessment & Plan Note (Signed)
loose stools x 2 days. Denied abd pain, nausea, vomiting, or fever. Prn Imodium for symptomatic management, update CBC/diff, CMP/eGFR, may stool C-diff toxin A/B of diarrhea persists, adding Florastor bid x 7 days.

## 2020-12-28 NOTE — Assessment & Plan Note (Signed)
on Pravachol, Fenofibric acid,  LDL 75 10/21/20

## 2020-12-29 LAB — CBC AND DIFFERENTIAL
HCT: 39 — AB (ref 41–53)
Hemoglobin: 12.5 — AB (ref 13.5–17.5)
Neutrophils Absolute: 5810
Platelets: 378 (ref 150–399)
WBC: 8.3

## 2020-12-29 LAB — BASIC METABOLIC PANEL
BUN: 28 — AB (ref 4–21)
CO2: 28 — AB (ref 13–22)
Chloride: 107 (ref 99–108)
Creatinine: 1.3 (ref 0.6–1.3)
Glucose: 125
Potassium: 3.7 (ref 3.4–5.3)
Sodium: 142 (ref 137–147)

## 2020-12-29 LAB — COMPREHENSIVE METABOLIC PANEL
Albumin: 3.7 (ref 3.5–5.0)
Calcium: 10.3 (ref 8.7–10.7)
Globulin: 3

## 2020-12-29 LAB — HEPATIC FUNCTION PANEL
ALT: 16 (ref 10–40)
AST: 20 (ref 14–40)
Alkaline Phosphatase: 37 (ref 25–125)
Bilirubin, Total: 0.5

## 2020-12-29 LAB — CBC: RBC: 4.34 (ref 3.87–5.11)

## 2020-12-31 ENCOUNTER — Encounter: Payer: Self-pay | Admitting: Internal Medicine

## 2021-01-02 ENCOUNTER — Encounter: Payer: Self-pay | Admitting: Orthopedic Surgery

## 2021-01-02 ENCOUNTER — Non-Acute Institutional Stay (SKILLED_NURSING_FACILITY): Payer: Federal, State, Local not specified - PPO | Admitting: Orthopedic Surgery

## 2021-01-02 DIAGNOSIS — R296 Repeated falls: Secondary | ICD-10-CM | POA: Diagnosis not present

## 2021-01-02 DIAGNOSIS — F0151 Vascular dementia with behavioral disturbance: Secondary | ICD-10-CM | POA: Diagnosis not present

## 2021-01-02 DIAGNOSIS — R195 Other fecal abnormalities: Secondary | ICD-10-CM

## 2021-01-02 DIAGNOSIS — F01518 Vascular dementia, unspecified severity, with other behavioral disturbance: Secondary | ICD-10-CM

## 2021-01-02 DIAGNOSIS — J0100 Acute maxillary sinusitis, unspecified: Secondary | ICD-10-CM

## 2021-01-02 NOTE — Progress Notes (Signed)
Location:   [facility] Room Number: [place] of Service:  SNF (31) Provider:  Windell Moulding, NP  [name], NP  Patient Care Team: [name], NP as PCP - General (Adult Health Nurse Practitioner) [name], MD as PCP - Cardiology (Cardiology)  Extended Emergency Contact Information Primary Emergency Contact: [name],[name]          [place], [place] Phone: [phone] Mobile Phone: [phone] Relation: Daughter Secondary Emergency Contact: [name],[name] Address: [address]           [place], [place] [address] [name] of [place] Phone: [phone] Mobile Phone: [phone] Relation: Daughter  Code Status:  FULL CODE Goals of care: Advanced Directive information Advanced Directives 01/02/2021  Does Patient Have a Medical Advance Directive? Yes  Type of Paramedic of [place];Living will  Does patient want to make changes to medical advance directive? No - Patient declined  Copy of [place] in Chart? Yes - validated most recent copy scanned in chart (See row information)  Would patient like information on creating a medical advance directive? -     Chief Complaint  Patient presents with   Acute Visit    Patient complains of loose stools and nasal congestion.     HPI:  Pt is a 85 y.o. male seen today for an acute visit for unresolved loose stools and nasal congestion.   He currently resides on the skilled nursing unit at [facility]. Past medical history includes: CAD, HTN, TIA, diverticular disease, diabetes, HOH, CKD, BPH, and recurrent falls.   07/09 he was taken to [facility] ED for increased cough an SOB. He was diagnosed with acute sinusitis and discharged with Augmentin, guaifenesin, and Flonase prescription. While on Augmentin he developed diarrhea and florastor was started. Augmentin completed 07/16. Today, he reports frontal lobe tenderness and  increased nasal congestion. He denies loose stools in the past 24 hours.   07/15 [facility] was [facility] due to increased weakness and recurrent falls. 07/17 he was found taking a shower on his own by CMA in the middle of the night. Incident discusses with social work and daughters. Daughters requesting bed alarm at night. 07/09 CT head revealed atrophy and chronic microvascular ischemic change from previous study.   Nurse does not report any other concerns, vitals stable.      Past Medical History:  Diagnosis Date   Cancer ([place])    skin   CKD (chronic kidney disease) 06/24/2013   Cochlear implant in place    Coronary artery disease    a. s/p CABG 2000; b. myoview 9/08: inf-septal scar with mild peri-infarct ischemia (low risk);  c.  Echo 8/13: mild focal basal septal hypertrophy, Gr 1 DD, mild LAE;  d. Lexiscan Myoview (06/20/2013): No reversible ischemia, inferior and septal infarct, inferior and septal hypokinesis, EF 60%;  e.  Echo (06/20/2011): EF 50-55%, normal wall motion, grade 1 diastolic dysfunction, mild LAE   Diverticular disease    Duodenitis    Elevated PSA    GERD (gastroesophageal reflux disease)    Gout    Humerus fracture    right   Hydronephrosis with ureteropelvic junction obstruction    Hypercalcemia 02/02/2012   a. 01/2012: 10.7.   Hyperlipidemia    Hypertension    Pancreatitis    a. ? Simvastatin related   Pneumonia age 46 or 7   "I think only once" (  06/19/2013)   Syncope    a. 06/2013   TIA (transient ischemic attack)    a. 01/2012 - presentation as acute imbalance;  b. 01/2012 carotid U/S w/o signif stenosis;  c. 01/2012 Echo: EF 60-65%, Gr 1 DD.;  d.  Carotid US (06/20/2013): 1-39% bilateral ICA stenosis   Tubular adenoma polyp of rectum    Type II diabetes mellitus ([place])    "diet and exercise controlled at present" (06/19/2013)   Past Surgical History:  Procedure Laterality Date   APPENDECTOMY     CARDIAC CATHETERIZATION  2000   CHOLECYSTECTOMY      COCHLEAR IMPLANT Right 1990's   COLONOSCOPY W/ POLYPECTOMY     no polyp 2012   CORONARY ARTERY BYPASS GRAFT  2000   CABG X5   INGUINAL HERNIA REPAIR Right 09/15/2015   Procedure: OPEN REPAIR RIGHT INGUINAL HERNIA ;  Surgeon: Jackolyn Confer, MD;  Location: [facility];  Service: General;  Laterality: Right;   INSERTION OF MESH Right 09/15/2015   Procedure: INSERTION OF MESH;  Surgeon: Jackolyn Confer, MD;  Location: [facility];  Service: General;  Laterality: Right;   PARATHYROIDECTOMY  1978   POLYPECTOMY     REVERSE SHOULDER ARTHROPLASTY Right 12/30/2014   Procedure: Right shoulder ORIF, Biomet S3 Plate and ZImmer cable system ;  Surgeon: Netta Cedars, MD;  Location: [facility];  Service: Orthopedics;  Laterality: Right;   right ankle surgery  age 62   TONSILLECTOMY  82   trachestomy as child for throat closing      Allergies  Allergen Reactions   Simvastatin Other (See Comments)    Possible statin-induced pancreatitis    Allergies as of 01/02/2021       Reactions   Simvastatin Other (See Comments)   Possible statin-induced pancreatitis        Medication List        Accurate as of January 02, 2021  1:42 PM. If you have any questions, ask your nurse or doctor.          STOP taking these medications    amoxicillin-clavulanate 875-125 MG tablet Commonly known as: AUGMENTIN Stopped by: [name], NP   blood glucose meter kit and supplies Kit Stopped by: [name], NP   cetirizine 10 MG tablet Commonly known as: ZYRTEC Stopped by: [name], NP   clopidogrel 75 MG tablet Commonly known as: PLAVIX Stopped by: [name], NP   pravastatin 20 MG tablet Commonly known as: PRAVACHOL Stopped by: [name], NP   vitamin C 1000 MG tablet Stopped by: [name], NP       TAKE these medications    acetaminophen 500 MG tablet Commonly known as: TYLENOL Take 500 mg by mouth 2 (two) times daily as needed for mild pain.   allopurinol 100 MG tablet Commonly known as:  ZYLOPRIM Take 100 mg by mouth daily.   colchicine 0.6 MG tablet Take 0.6 mg by mouth daily as needed (as directed for gout flares).   Fenofibric Acid 135 MG Cpdr Take 1 tablet by mouth in the morning.   finasteride 5 MG tablet Commonly known as: PROSCAR Take 5 mg by mouth every evening.   fluticasone 50 MCG/ACT nasal spray Commonly known as: FLONASE Place 2 sprays into both nostrils daily.   ipratropium-albuterol 0.5-2.5 (3) MG/3ML Soln Commonly known as: DUONEB Take 3 mLs by nebulization every 6 (six) hours as needed.   loperamide 2 MG tablet Commonly known as: IMODIUM A-D Take  2 mg by mouth 3 (three) times daily as needed for diarrhea or loose stools.   metFORMIN 500 MG tablet Commonly known as: GLUCOPHAGE Take 500 mg by mouth in the morning.   saccharomyces boulardii 250 MG capsule Commonly known as: FLORASTOR Take 250 mg by mouth 2 (two) times daily.   zinc oxide 20 % ointment Apply 1 application topically as needed for irritation.        Review of Systems  Constitutional:  Negative for activity change, appetite change and fatigue.  HENT:  Positive for congestion, hearing loss, rhinorrhea, sinus pressure and sinus pain. Negative for dental problem and trouble swallowing.   Respiratory:  Negative for cough, shortness of breath and wheezing.   Cardiovascular:  Positive for leg swelling. Negative for chest pain.  Neurological:  Positive for weakness. Negative for dizziness, facial asymmetry and headaches.  Psychiatric/Behavioral:  Positive for confusion. Negative for dysphoric mood. The patient is not nervous/anxious.    Immunization History  Administered Date(s) Administered   Fluad Quad(high Dose 65+) 04/01/2019   Influenza Split 04/13/2011, 05/02/2012   Influenza Whole 05/22/2007, 04/12/2008, 05/02/2009, 04/14/2010   Influenza, High Dose Seasonal PF 04/17/2013, 03/16/2014, 03/05/2016, 03/18/2017   Influenza,inj,Quad PF,6+ Mos 03/02/2015, 03/18/2018    Influenza-Unspecified 03/18/2018   Moderna Sars-Covid-2 Vaccination 06/22/2019, 07/20/2019, 05/02/2020, 11/23/2020   PPD Test 01/02/2015   Pneumococcal Conjugate-13 12/29/2015   Pneumococcal Polysaccharide-23 04/19/2006   Tdap 09/04/2016   Zoster, Live 06/29/2015   Pertinent  Health Maintenance Due  Topic Date Due   URINE MICROALBUMIN  01/15/2020   FOOT EXAM  07/20/2020   INFLUENZA VACCINE  01/16/2021   HEMOGLOBIN A1C  04/23/2021   OPHTHALMOLOGY EXAM  08/31/2021   PNA vac Low Risk Adult  Completed   Fall Risk  12/07/2020 10/13/2020 08/23/2020 07/21/2019 01/15/2019  Falls in the past year? 1 1 0 0 0  Number falls in past yr: 1 1 0 0 0  Comment abrasions - - - -  Injury with Fall? 0 1 0 - -  Comment - left arm and knee pain - - -  Risk for fall due to : - - No Fall Risks - -  Follow up - Falls evaluation completed Falls evaluation completed - -  Comment - - - - -   Functional Status Survey:    Vitals:   01/02/21 1330  BP: 120/67  Pulse: 89  Resp: 16  Temp: 97.7 F (36.5 C)  SpO2: 94%  Weight: 170 lb 11.2 oz (77.4 kg)  Height: '5\' 11"'  (1.803 m)   Body mass index is 23.81 kg/m. Physical Exam Vitals reviewed.  Constitutional:      General: He is not in acute distress. HENT:     Head: Normocephalic.     Right Ear: There is no impacted cerumen.     Left Ear: There is no impacted cerumen.     Nose: Nasal tenderness and congestion present.     Right Turbinates: Not enlarged or swollen.     Left Turbinates: Not enlarged or swollen.     Right Sinus: No maxillary sinus tenderness.     Left Sinus: Frontal sinus tenderness present. No maxillary sinus tenderness.  Cardiovascular:     Rate and Rhythm: Normal rate and regular rhythm.     Pulses: Normal pulses.     Heart sounds: Normal heart sounds. No murmur heard. Pulmonary:     Effort: Pulmonary effort is normal. No respiratory distress.     Breath sounds: Normal breath sounds. No  wheezing.  Musculoskeletal:     Right lower  leg: Edema present.     Left lower leg: Edema present.     Comments: Non-pitting  Skin:    General: Skin is warm and dry.  Neurological:     General: No focal deficit present.     Mental Status: He is alert. Mental status is at baseline.     Motor: Weakness present.     Gait: Gait abnormal.     Comments: Wheelchair/walker  Psychiatric:        Mood and Affect: Mood normal.        Behavior: Behavior normal.        Cognition and Memory: Memory is impaired.    Labs reviewed: Recent Labs    10/22/20 0021 10/22/20 1222 12/24/20 1224  NA 140 138 139  K 3.6 3.6 3.4*  CL 110 107 108  CO2 '24 24 25  ' GLUCOSE 110* 121* 195*  BUN 31* 24* 34*  CREATININE 1.73* 1.30* 1.32*  CALCIUM 9.7 10.0 10.2   Recent Labs    08/23/20 1154 10/20/20 1315 12/24/20 1224  AST 22 17 34  ALT '19 14 24  ' ALKPHOS 36* 32* 31*  BILITOT 0.5 0.4 0.7  PROT 6.9 6.3* 7.0  ALBUMIN 4.0 3.7 3.4*   Recent Labs    08/23/20 1154 10/20/20 1315 12/24/20 1224  WBC 5.6 6.3 5.3  NEUTROABS 3.1 3.9 3.9  HGB 13.0 12.5* 11.2*  HCT 38.5* 39.1 35.4*  MCV 87.6 92.7 90.5  PLT 264.0 265 234   Lab Results  Component Value Date   TSH 2.21 07/03/2016   Lab Results  Component Value Date   HGBA1C 7.1 (H) 10/21/2020   Lab Results  Component Value Date   CHOL 135 10/21/2020   HDL 31 (L) 10/21/2020   LDLCALC 75 10/21/2020   LDLDIRECT 96.0 08/23/2020   TRIG 145 10/21/2020   CHOLHDL 4.4 10/21/2020    Significant Diagnostic Results in last 30 days:  CT Head Wo Contrast  Result Date: 12/24/2020 CLINICAL DATA:  Altered mental status. EXAM: CT HEAD WITHOUT CONTRAST TECHNIQUE: Contiguous axial images were obtained from the base of the skull through the vertex without intravenous contrast. COMPARISON:  For head CT 10/20/2020. FINDINGS: Brain: No evidence of acute infarction, hemorrhage, hydrocephalus, extra-axial collection or mass lesion/mass effect. Extensive streak artifact from the patient's right cochlear implant is  noted. Chronic microvascular ischemic change and mild cortical atrophy are again seen. Vascular: No hyperdense vessel or unexpected calcification. Skull: Intact.  No focal lesion. Sinuses/Orbits: Complete opacification of the right maxillary sinus is again seen. Ethmoid air cell disease is much worse than on the prior examination. There is a new air-fluid level in the left maxillary sinus. Mild mucosal thickening in the sphenoid sinuses has also progressed. Other: None. IMPRESSION: No acute intracranial abnormality. Extensive sinus disease is much worse than on the comparison head CT and includes a new air-fluid level in the left maxillary sinus consistent with acute sinusitis. Atrophy and chronic microvascular ischemic change. Electronically Signed   By: Inge Rise M.D.   On: 12/24/2020 13:02   DG Chest Portable 1 View  Result Date: 12/24/2020 CLINICAL DATA:  Shortness of breath and nonproductive cough for the past week. EXAM: PORTABLE CHEST 1 VIEW COMPARISON:  10/13/2020 FINDINGS: Normal sized heart. Stable post CABG changes. Stable linear scarring at the lung bases. Stable right proximal humerus fixation hardware. IMPRESSION: No acute abnormality. Electronically Signed   By: Percell Locus.D.  On: 12/24/2020 12:12    Assessment/Plan 1. Acute maxillary sinusitis, recurrence not specified - ongoing, frontal tenderness and nasal congestion persist after 1 week course of Augmentin - nasal turbinates swollen, nasal congestion clear - doxycycline 100 mg po bid x 5 days - cont Flonase - cont zyrtec  2. Vascular dementia with behavior disturbance ([place]) - CT head 07/09 revealed atrophy and chronic microvascular ischemic change - in the past 8 weeks his gait has declined, continues to have falls - cont skilled nursing care  3. Loose stools - suspect due to Augmentin - no reports within last 24 hours - cont Florastor  4. Recurrent falls - bed alarm at night - ambulating with wheelchair more   - cont skilled nursing care    Family/ staff Communication: plan discussed with patient, daughters, and nurse  Labs/tests ordered:  none

## 2021-01-05 ENCOUNTER — Non-Acute Institutional Stay (SKILLED_NURSING_FACILITY): Payer: Federal, State, Local not specified - PPO | Admitting: Internal Medicine

## 2021-01-05 ENCOUNTER — Encounter: Payer: Self-pay | Admitting: Internal Medicine

## 2021-01-05 DIAGNOSIS — E119 Type 2 diabetes mellitus without complications: Secondary | ICD-10-CM

## 2021-01-05 DIAGNOSIS — N138 Other obstructive and reflux uropathy: Secondary | ICD-10-CM

## 2021-01-05 DIAGNOSIS — R2681 Unsteadiness on feet: Secondary | ICD-10-CM

## 2021-01-05 DIAGNOSIS — N401 Enlarged prostate with lower urinary tract symptoms: Secondary | ICD-10-CM

## 2021-01-05 DIAGNOSIS — J0100 Acute maxillary sinusitis, unspecified: Secondary | ICD-10-CM

## 2021-01-05 DIAGNOSIS — N1831 Chronic kidney disease, stage 3a: Secondary | ICD-10-CM

## 2021-01-05 DIAGNOSIS — G459 Transient cerebral ischemic attack, unspecified: Secondary | ICD-10-CM

## 2021-01-05 DIAGNOSIS — R296 Repeated falls: Secondary | ICD-10-CM | POA: Diagnosis not present

## 2021-01-05 DIAGNOSIS — Z8739 Personal history of other diseases of the musculoskeletal system and connective tissue: Secondary | ICD-10-CM

## 2021-01-05 NOTE — Progress Notes (Signed)
Provider:  Veleta Miners MD  Location:   [facility] Room Number: 11 Place of Service:  SNF ([id number])  PCP: [name], NP Patient Care Team: [name], NP as PCP - General (Adult Health Nurse Practitioner) [name], MD as PCP - Cardiology (Cardiology)  Extended Emergency Contact Information Primary Emergency Contact: [name],[name]          [place], [place] of [place] Phone: [phone] Mobile Phone: [phone] Relation: Daughter Secondary Emergency Contact: [name],[name] Address: [address]           [place], [place] [address] [name] of [place] Phone: [phone] Mobile Phone: [phone] Relation: Daughter  Code Status: Full Code Goals of Care: Advanced Directive information Advanced Directives 01/05/2021  Does Patient Have a Medical Advance Directive? Yes  Type of Paramedic of [place];Living will  Does patient want to make changes to medical advance directive? No - Patient declined  Copy of [place] in Chart? Yes - validated most recent copy scanned in chart (See row information)  Would patient like information on creating a medical advance directive? -      Chief Complaint  Patient presents with   New Admit To SNF    Admission to SNF    HPI: Patient is a 85 y.o. male seen today for Readmission to SNF   Patient has a history of CAD, gout, HLD, hypertension and diabetes Patient was admitted in the hospital from 5/5-5/9 for Possible CVA  He made good progress with therapy and was able to transition to AL but he was falling in the AL requiring higher level of care Was seen by  Neurology. Their Note says ? Neurodegenerative disorder Parkinson vs Dementia He was also diagnosed with Acute Left Maxillary sinusitis. Treated with Augmentin Symptoms return and now on Doxycyline Feeling much better denies any acute symptoms Continues to be to have unstable gait. Talked  to the therapist and he still is mod assist.for Ambulation and Mild Assist for transfers Able to answer all Commands and is alert   Past Medical History:  Diagnosis Date   Cancer ([place])    skin   CKD (chronic kidney disease) 06/24/2013   Cochlear implant in place    Coronary artery disease    a. s/p CABG 2000; b. myoview 9/08: inf-septal scar with mild peri-infarct ischemia (low risk);  c.  Echo 8/13: mild focal basal septal hypertrophy, Gr 1 DD, mild LAE;  d. Lexiscan Myoview (06/20/2013): No reversible ischemia, inferior and septal infarct, inferior and septal hypokinesis, EF 60%;  e.  Echo (06/20/2011): EF 50-55%, normal wall motion, grade 1 diastolic dysfunction, mild LAE   Diverticular disease    Duodenitis    Elevated PSA    GERD (gastroesophageal reflux disease)    Gout    Humerus fracture    right   Hydronephrosis with ureteropelvic junction obstruction    Hypercalcemia 02/02/2012   a. 01/2012: 10.7.   Hyperlipidemia    Hypertension    Pancreatitis    a. ? Simvastatin related   Pneumonia age 9 or 7   "I think only once" (06/19/2013)   Syncope    a. 06/2013   TIA (transient ischemic attack)    a. 01/2012 - presentation as acute imbalance;  b. 01/2012 carotid U/S w/o signif stenosis;  c. 01/2012 Echo: EF 60-65%, Gr 1 DD.;  d.  Carotid US (06/20/2013): 1-39% bilateral ICA stenosis   Tubular adenoma polyp  of rectum    Type II diabetes mellitus ([place])    "diet and exercise controlled at present" (06/19/2013)   Past Surgical History:  Procedure Laterality Date   APPENDECTOMY     CARDIAC CATHETERIZATION  2000   CHOLECYSTECTOMY     COCHLEAR IMPLANT Right 1990's   COLONOSCOPY W/ POLYPECTOMY     no polyp 2012   CORONARY ARTERY BYPASS GRAFT  2000   CABG X5   INGUINAL HERNIA REPAIR Right 09/15/2015   Procedure: OPEN REPAIR RIGHT INGUINAL HERNIA ;  Surgeon: Jackolyn Confer, MD;  Location: [facility];  Service: General;  Laterality: Right;   INSERTION OF MESH Right 09/15/2015   Procedure:  INSERTION OF MESH;  Surgeon: Jackolyn Confer, MD;  Location: [facility];  Service: General;  Laterality: Right;   PARATHYROIDECTOMY  1978   POLYPECTOMY     REVERSE SHOULDER ARTHROPLASTY Right 12/30/2014   Procedure: Right shoulder ORIF, Biomet S3 Plate and ZImmer cable system ;  Surgeon: Netta Cedars, MD;  Location: [facility];  Service: Orthopedics;  Laterality: Right;   right ankle surgery  age 37   TONSILLECTOMY  1943   trachestomy as child for throat closing      reports that he has quit smoking. His smoking use included cigars. He has never used smokeless tobacco. He reports current alcohol use. He reports that he does not use drugs. Social History   Socioeconomic History   Marital status: Widowed    Spouse name: Not on file   Number of children: 2   Years of education: college   Highest education level: Not on file  Occupational History   Occupation: retired    Fish farm manager: RETIRED  Tobacco Use   Smoking status: Former    Types: Cigars   Smokeless tobacco: Never   Tobacco comments:    06/19/2013 "rare cigar", 12/07/20 quit  Substance and Sexual Activity   Alcohol use: Yes    Comment: 12/07/20 1-2 a week   Drug use: No   Sexual activity: Not Currently    Birth control/protection: Injection  Other Topics Concern   Not on file  Social History Narrative   12/07/20 lives at  [place]   Does get regular exercise   No caffeine   Social Determinants of Health   Financial Resource Strain: Not on file  Food Insecurity: Not on file  Transportation Needs: Not on file  Physical Activity: Not on file  Stress: Not on file  Social Connections: Not on file  Intimate Partner Violence: Not on file    Functional Status Survey:    Family History  Problem Relation Age of Onset   Diabetes Mother    Melanoma Father    Heart attack Father         in [place] Maintenance  Topic Date Due   Zoster Vaccines- Shingrix (1 of 2) Never done   URINE MICROALBUMIN  01/15/2020   FOOT EXAM   07/20/2020   INFLUENZA VACCINE  01/16/2021   HEMOGLOBIN A1C  04/23/2021   OPHTHALMOLOGY EXAM  08/31/2021   TETANUS/TDAP  09/05/2026   COVID-19 Vaccine  Completed   PNA vac Low Risk Adult  Completed   HPV VACCINES  Aged Out    Allergies  Allergen Reactions   Simvastatin Other (See Comments)    Possible statin-induced pancreatitis    Allergies as of 01/05/2021       Reactions   Simvastatin Other (See Comments)   Possible statin-induced pancreatitis  Medication List        Accurate as of January 05, 2021  1:38 PM. If you have any questions, ask your nurse or doctor.          STOP taking these medications    loperamide 2 MG tablet Commonly known as: IMODIUM A-D Stopped by: Virgie Dad, MD   saccharomyces boulardii 250 MG capsule Commonly known as: FLORASTOR Stopped by: Virgie Dad, MD       TAKE these medications    acetaminophen 500 MG tablet Commonly known as: TYLENOL Take 500 mg by mouth 2 (two) times daily as needed for mild pain.   allopurinol 100 MG tablet Commonly known as: ZYLOPRIM Take 100 mg by mouth daily.   clopidogrel 75 MG tablet Commonly known as: PLAVIX Take 75 mg by mouth daily.   colchicine 0.6 MG tablet Take 0.6 mg by mouth daily as needed (as directed for gout flares).   doxycycline 100 MG tablet Commonly known as: VIBRA-TABS Take 100 mg by mouth 2 (two) times daily.   Fenofibric Acid 135 MG Cpdr Take 1 tablet by mouth in the morning.   finasteride 5 MG tablet Commonly known as: PROSCAR Take 5 mg by mouth every evening.   fluticasone 50 MCG/ACT nasal spray Commonly known as: FLONASE Place 2 sprays into both nostrils daily.   ipratropium-albuterol 0.5-2.5 (3) MG/3ML Soln Commonly known as: DUONEB Take 3 mLs by nebulization every 6 (six) hours as needed.   metFORMIN 500 MG tablet Commonly known as: GLUCOPHAGE Take 500 mg by mouth in the morning.   zinc oxide 20 % ointment Apply 1 application topically as  needed for irritation.        Review of Systems Review of Systems  Constitutional: Negative for activity change, appetite change, chills, diaphoresis, fatigue and fever.  HENT: Negative for mouth sores, postnasal drip, rhinorrhea, sinus pain and sore throat.   Respiratory: Negative for apnea, cough, chest tightness, shortness of breath and wheezing.   Cardiovascular: Negative for chest pain, palpitations and leg swelling.  Gastrointestinal: Negative for abdominal distention, abdominal pain, constipation, diarrhea, nausea and vomiting.  Genitourinary: Negative for dysuria and frequency.  Musculoskeletal: Negative for arthralgias, joint swelling and myalgias.  Skin: Negative for rash.  Neurological: Negative for dizziness, syncope, weakness, light-headedness and numbness.  Psychiatric/Behavioral: Negative for behavioral problems, confusion and sleep disturbance.   Vitals:   01/05/21 1051  BP: 132/70  Pulse: 68  Resp: 20  Temp: (!) 97.4 F (36.3 C)  SpO2: 94%  Weight: 170 lb 11.2 oz (77.4 kg)  Height: 5\' 9"  (1.753 m)   Body mass index is 25.21 kg/m. Physical Exam Constitutional: Oriented to person, place, and time. Well-developed and well-nourished.  HENT:  Head: Normocephalic.  Mouth/Throat: Oropharynx is clear and moist.  No Sinus Tenderness  Eyes: Pupils are equal, round, and reactive to light.  Neck: Neck supple.  Cardiovascular: Normal rate and normal heart sounds.  No murmur heard. Pulmonary/Chest: Effort normal and breath sounds normal. No respiratory distress. No wheezes.has no rales.  Abdominal: Soft. Bowel sounds are normal. No distension. There is no tenderness. There is no rebound.  Musculoskeletal: No edema.  Lymphadenopathy: none Neurological: Alert and oriented to person, place, and time.  No Focal Deficits Did not check the gait  Skin: Skin is warm and dry.  Psychiatric: Normal mood and affect. Behavior is normal. Thought content normal.   Labs  reviewed: Basic Metabolic Panel: Recent Labs    10/22/20 0021 10/22/20 1222 12/24/20  1224  NA 140 138 139  K 3.6 3.6 3.4*  CL 110 107 108  CO2 24 24 25   GLUCOSE 110* 121* 195*  BUN 31* 24* 34*  CREATININE 1.73* 1.30* 1.32*  CALCIUM 9.7 10.0 10.2   Liver Function Tests: Recent Labs    08/23/20 1154 10/20/20 1315 12/24/20 1224  AST 22 17 34  ALT 19 14 24   ALKPHOS 36* 32* 31*  BILITOT 0.5 0.4 0.7  PROT 6.9 6.3* 7.0  ALBUMIN 4.0 3.7 3.4*   No results for input(s): LIPASE, AMYLASE in the last 8760 hours. No results for input(s): AMMONIA in the last 8760 hours. CBC: Recent Labs    08/23/20 1154 10/20/20 1315 12/24/20 1224  WBC 5.6 6.3 5.3  NEUTROABS 3.1 3.9 3.9  HGB 13.0 12.5* 11.2*  HCT 38.5* 39.1 35.4*  MCV 87.6 92.7 90.5  PLT 264.0 265 234   Cardiac Enzymes: No results for input(s): CKTOTAL, CKMB, CKMBINDEX, TROPONINI in the last 8760 hours. BNP: Invalid input(s): POCBNP Lab Results  Component Value Date   HGBA1C 7.1 (H) 10/21/2020   Lab Results  Component Value Date   TSH 2.21 07/03/2016   No results found for: VITAMINB12 No results found for: FOLATE No results found for: IRON, TIBC, FERRITIN  Imaging and Procedures obtained prior to SNF admission: CT Head Wo Contrast  Result Date: 12/24/2020 CLINICAL DATA:  Altered mental status. EXAM: CT HEAD WITHOUT CONTRAST TECHNIQUE: Contiguous axial images were obtained from the base of the skull through the vertex without intravenous contrast. COMPARISON:  For head CT 10/20/2020. FINDINGS: Brain: No evidence of acute infarction, hemorrhage, hydrocephalus, extra-axial collection or mass lesion/mass effect. Extensive streak artifact from the patient's right cochlear implant is noted. Chronic microvascular ischemic change and mild cortical atrophy are again seen. Vascular: No hyperdense vessel or unexpected calcification. Skull: Intact.  No focal lesion. Sinuses/Orbits: Complete opacification of the right maxillary  sinus is again seen. Ethmoid air cell disease is much worse than on the prior examination. There is a new air-fluid level in the left maxillary sinus. Mild mucosal thickening in the sphenoid sinuses has also progressed. Other: None. IMPRESSION: No acute intracranial abnormality. Extensive sinus disease is much worse than on the comparison head CT and includes a new air-fluid level in the left maxillary sinus consistent with acute sinusitis. Atrophy and chronic microvascular ischemic change. Electronically Signed   By: Inge Rise M.D.   On: 12/24/2020 13:02   DG Chest Portable 1 View  Result Date: 12/24/2020 CLINICAL DATA:  Shortness of breath and nonproductive cough for the past week. EXAM: PORTABLE CHEST 1 VIEW COMPARISON:  10/13/2020 FINDINGS: Normal sized heart. Stable post CABG changes. Stable linear scarring at the lung bases. Stable right proximal humerus fixation hardware. IMPRESSION: No acute abnormality. Electronically Signed   By: Claudie Revering M.D.   On: 12/24/2020 12:12    Assessment/Plan Acute maxillary sinusitis, recurrence not specified Is doing better on Doxycyline   Unstable gait with Recurent falls D/W Therapy and he has made some progress. And will benefit with continued therapy He continues to need Mod assist and not able to do his transfers.  Per therapy he is very responsive to therapy and cognitively able to follow Commands Does need early  follow up with Neurology  CT scan in ED did not show any acute changes Cannot do MRI due to Auditory Implant  Stage 3a chronic kidney disease ([place]) Creat stable TIA (transient ischemic attack) On Plavix Allergic to statin On Fenofibrate History  of gout On Allopurinol Diabetes mellitus without complication (HCC) X7D good on Metformin Benign prostatic hyperplasia  Symptoms controlled on Proscar   Family/ staff Communication:   Labs/tests ordered:

## 2021-01-06 ENCOUNTER — Encounter: Payer: Self-pay | Admitting: Orthopedic Surgery

## 2021-01-06 ENCOUNTER — Non-Acute Institutional Stay (INDEPENDENT_AMBULATORY_CARE_PROVIDER_SITE_OTHER): Payer: Federal, State, Local not specified - PPO | Admitting: Orthopedic Surgery

## 2021-01-06 ENCOUNTER — Telehealth: Payer: Self-pay | Admitting: Diagnostic Neuroimaging

## 2021-01-06 DIAGNOSIS — Z0000 Encounter for general adult medical examination without abnormal findings: Secondary | ICD-10-CM

## 2021-01-06 NOTE — Progress Notes (Signed)
Provider:  Windell Moulding, NP  Location:  [facility] Room Number: N11 Place of Service:  SNF (72)   PCP: [name], NP Patient Care Team: [name], NP as PCP - General (Adult Health Nurse Practitioner) [name], MD as PCP - Cardiology (Cardiology)  Extended Emergency Contact Information Primary Emergency Contact: [name],[name]          [place], [place] Phone: [phone] Mobile Phone: [phone] Relation: Daughter Secondary Emergency Contact: [name],[name] Address: [address]           [place], [place] [address] [name] of [place] Phone: [phone] Mobile Phone: [phone] Relation: Daughter  Code Status: FULL CODE Goals of Care: Advanced Directive information Advanced Directives 01/06/2021  Does Patient Have a Medical Advance Directive? Yes  Type of Paramedic of [place];Living will  Does patient want to make changes to medical advance directive? No - Patient declined  Copy of [place] in Chart? Yes - validated most recent copy scanned in chart (See row information)  Would patient like information on creating a medical advance directive? -     Chief Complaint  Patient presents with   Annual Exam    Annual wellness     HPI: Patient is a 85 y.o. male seen today for an annual comprehensive examination.  Past Medical History:  Diagnosis Date   Cancer ([place])    skin   CKD (chronic kidney disease) 06/24/2013   Cochlear implant in place    Coronary artery disease    a. s/p CABG 2000; b. myoview 9/08: inf-septal scar with mild peri-infarct ischemia (low risk);  c.  Echo 8/13: mild focal basal septal hypertrophy, Gr 1 DD, mild LAE;  d. Lexiscan Myoview (06/20/2013): No reversible ischemia, inferior and septal infarct, inferior and septal hypokinesis, EF 60%;  e.  Echo (06/20/2011): EF 50-55%, normal wall motion, grade 1 diastolic dysfunction, mild LAE    Diverticular disease    Duodenitis    Elevated PSA    GERD (gastroesophageal reflux disease)    Gout    Humerus fracture    right   Hydronephrosis with ureteropelvic junction obstruction    Hypercalcemia 02/02/2012   a. 01/2012: 10.7.   Hyperlipidemia    Hypertension    Pancreatitis    a. ? Simvastatin related   Pneumonia age 70 or 7   "I think only once" (06/19/2013)   Syncope    a. 06/2013   TIA (transient ischemic attack)    a. 01/2012 - presentation as acute imbalance;  b. 01/2012 carotid U/S w/o signif stenosis;  c. 01/2012 Echo: EF 60-65%, Gr 1 DD.;  d.  Carotid US (06/20/2013): 1-39% bilateral ICA stenosis   Tubular adenoma polyp of rectum    Type II diabetes mellitus ([place])    "diet and exercise controlled at present" (06/19/2013)   Past Surgical History:  Procedure Laterality Date   APPENDECTOMY     CARDIAC CATHETERIZATION  2000   CHOLECYSTECTOMY     COCHLEAR IMPLANT Right 1990's   COLONOSCOPY W/ POLYPECTOMY     no polyp 2012   CORONARY ARTERY BYPASS GRAFT  2000   CABG X5   INGUINAL HERNIA REPAIR Right 09/15/2015   Procedure: OPEN REPAIR RIGHT INGUINAL HERNIA ;  Surgeon: Jackolyn Confer, MD;  Location[facility];  Service: General;  Laterality: Right;   INSERTION OF MESH Right 09/15/2015   Procedure: INSERTION OF MESH;  Surgeon: Jackolyn Confer, MD;  [facility];  Service: General;  Laterality: Right;   PARATHYROIDECTOMY  1978   POLYPECTOMY     REVERSE SHOULDER ARTHROPLASTY Right 12/30/2014   Procedure: Right shoulder ORIF, Biomet S3 Plate and ZImmer cable system ;  Surgeon: Netta Cedars, MD;  Location: [facility];  Service: Orthopedics;  Laterality: Right;   right ankle surgery  age 70   TONSILLECTOMY  1943   trachestomy as child for throat closing      reports that he has quit smoking. His smoking use included cigars. He has never used smokeless tobacco. He reports current alcohol use. He reports that he does not use drugs. Social History   Socioeconomic History   Marital  status: Widowed    Spouse name: Not on file   Number of children: 2   Years of education: college   Highest education level: Not on file  Occupational History   Occupation: retired    Fish farm manager: RETIRED  Tobacco Use   Smoking status: Former    Types: Cigars   Smokeless tobacco: Never   Tobacco comments:    06/19/2013 "rare cigar", 12/07/20 quit  Substance and Sexual Activity   Alcohol use: Yes    Comment: 12/07/20 1-2 a week   Drug use: No   Sexual activity: Not Currently    Birth control/protection: Injection  Other Topics Concern   Not on file  Social History Narrative   12/07/20 lives at  [place]   Does get regular exercise   No caffeine   Social Determinants of Health   Financial Resource Strain: Low Risk    Difficulty of Paying Living Expenses: Not hard at all  Food Insecurity: No Food Insecurity   Worried About Charity fundraiser in the Last Year: Never true   [place] in the Last Year: Never true  Transportation Needs: No Transportation Needs   Lack of Transportation (Medical): No   Lack of Transportation (Non-Medical): No  Physical Activity: Not on file  Stress: No Stress Concern Present   Feeling of Stress : Only a little  Social Connections: Unknown   Frequency of Communication with Friends and Family: Three times a week   Frequency of Social Gatherings with Friends and Family: Once a week   Attends Religious Services: Never   Marine scientist or Organizations: No   Attends Music therapist: Never   Marital Status: Patient refused  Human resources officer Violence: Not At Risk   Fear of Current or Ex-Partner: No   Emotionally Abused: No   Physically Abused: No   Sexually Abused: No   Family History  Problem Relation Age of Onset   Diabetes Mother    Melanoma Father    Heart attack Father         in 89s    Pertinent  Health Maintenance Due  Topic Date Due   URINE MICROALBUMIN  01/15/2020   FOOT EXAM  07/20/2020    INFLUENZA VACCINE  01/16/2021   HEMOGLOBIN A1C  04/23/2021   OPHTHALMOLOGY EXAM  08/31/2021   PNA vac Low Risk Adult  Completed   Fall Risk  01/09/2021 12/07/2020 10/13/2020 08/23/2020 07/21/2019  Falls in the past year? - 1 1 0 0  Number falls in past yr: '1 1 1 '$ 0 0  Comment - abrasions - - -  Injury with Fall? 1 0 1 0 -  Comment - - left arm and knee pain - -  Risk for fall due to :  History of fall(s);Impaired balance/gait;Impaired mobility;Mental status change - - No Fall Risks -  Follow up Falls evaluation completed;Education provided;Falls prevention discussed - Falls evaluation completed Falls evaluation completed -  Comment - - - - -   Depression screen [facility] 2/9 01/09/2021 01/19/2020 01/15/2019 03/18/2017 03/05/2016  Decreased Interest 0 0 0 0 0  Down, Depressed, Hopeless 0 0 0 0 0  PHQ - 2 Score 0 0 0 0 0  Some recent data might be hidden    Functional Status Survey: Is the patient deaf or have difficulty hearing?: Yes Does the patient have difficulty seeing, even when wearing glasses/contacts?: Yes Does the patient have difficulty concentrating, remembering, or making decisions?: Yes Does the patient have difficulty walking or climbing stairs?: Yes Does the patient have difficulty dressing or bathing?: Yes Does the patient have difficulty doing errands alone such as visiting a doctor's office or shopping?: Yes  Allergies  Allergen Reactions   Simvastatin Other (See Comments)    Possible statin-induced pancreatitis    Allergies as of 01/06/2021       Reactions   Simvastatin Other (See Comments)   Possible statin-induced pancreatitis        Medication List        Accurate as of January 06, 2021 11:59 PM. If you have any questions, ask your nurse or doctor.          acetaminophen 500 MG tablet Commonly known as: TYLENOL Take 500 mg by mouth 2 (two) times daily as needed for mild pain.   allopurinol 100 MG tablet Commonly known as: ZYLOPRIM Take 100 mg by mouth daily.    clopidogrel 75 MG tablet Commonly known as: PLAVIX Take 75 mg by mouth daily.   colchicine 0.6 MG tablet Take 0.6 mg by mouth daily as needed (as directed for gout flares).   doxycycline 100 MG tablet Commonly known as: VIBRA-TABS Take 100 mg by mouth 2 (two) times daily.   Fenofibric Acid 135 MG Cpdr Take 1 tablet by mouth in the morning.   finasteride 5 MG tablet Commonly known as: PROSCAR Take 5 mg by mouth every evening.   fluticasone 50 MCG/ACT nasal spray Commonly known as: FLONASE Place 2 sprays into both nostrils daily.   ipratropium-albuterol 0.5-2.5 (3) MG/3ML Soln Commonly known as: DUONEB Take 3 mLs by nebulization every 6 (six) hours as needed.   metFORMIN 500 MG tablet Commonly known as: GLUCOPHAGE Take 500 mg by mouth in the morning.   zinc oxide 20 % ointment Apply 1 application topically as needed for irritation.        Review of Systems  Vitals:   01/06/21 1428  BP: (!) 145/80  Pulse: 92  Resp: 20  Temp: (!) 97.4 F (36.3 C)  SpO2: 96%  Weight: 170 lb 11.2 oz (77.4 kg)  Height: '5\' 9"'$  (1.753 m)   Body mass index is 25.21 kg/m. Physical Exam  Labs reviewed: Basic Metabolic Panel: Recent Labs    10/22/20 0021 10/22/20 1222 12/24/20 1224  NA 140 138 139  K 3.6 3.6 3.4*  CL 110 107 108  CO2 '24 24 25  '$ GLUCOSE 110* 121* 195*  BUN 31* 24* 34*  CREATININE 1.73* 1.30* 1.32*  CALCIUM 9.7 10.0 10.2   Liver Function Tests: Recent Labs    08/23/20 1154 10/20/20 1315 12/24/20 1224  AST 22 17 34  ALT '19 14 24  '$ ALKPHOS 36* 32* 31*  BILITOT 0.5 0.4 0.7  PROT 6.9 6.3* 7.0  ALBUMIN 4.0 3.7  3.4*   No results for input(s): LIPASE, AMYLASE in the last 8760 hours. No results for input(s): AMMONIA in the last 8760 hours. CBC: Recent Labs    08/23/20 1154 10/20/20 1315 12/24/20 1224  WBC 5.6 6.3 5.3  NEUTROABS 3.1 3.9 3.9  HGB 13.0 12.5* 11.2*  HCT 38.5* 39.1 35.4*  MCV 87.6 92.7 90.5  PLT 264.0 265 234   Cardiac Enzymes: No  results for input(s): CKTOTAL, CKMB, CKMBINDEX, TROPONINI in the last 8760 hours. BNP: Invalid input(s): POCBNP Lab Results  Component Value Date   HGBA1C 7.1 (H) 10/21/2020   Lab Results  Component Value Date   TSH 2.21 07/03/2016   No results found for: VITAMINB12 No results found for: FOLATE No results found for: IRON, TIBC, FERRITIN  Imaging and Procedures obtained recently: CT Head Wo Contrast  Result Date: 12/24/2020 CLINICAL DATA:  Altered mental status. EXAM: CT HEAD WITHOUT CONTRAST TECHNIQUE: Contiguous axial images were obtained from the base of the skull through the vertex without intravenous contrast. COMPARISON:  For head CT 10/20/2020. FINDINGS: Brain: No evidence of acute infarction, hemorrhage, hydrocephalus, extra-axial collection or mass lesion/mass effect. Extensive streak artifact from the patient's right cochlear implant is noted. Chronic microvascular ischemic change and mild cortical atrophy are again seen. Vascular: No hyperdense vessel or unexpected calcification. Skull: Intact.  No focal lesion. Sinuses/Orbits: Complete opacification of the right maxillary sinus is again seen. Ethmoid air cell disease is much worse than on the prior examination. There is a new air-fluid level in the left maxillary sinus. Mild mucosal thickening in the sphenoid sinuses has also progressed. Other: None. IMPRESSION: No acute intracranial abnormality. Extensive sinus disease is much worse than on the comparison head CT and includes a new air-fluid level in the left maxillary sinus consistent with acute sinusitis. Atrophy and chronic microvascular ischemic change. Electronically Signed   By: Inge Rise M.D.   On: 12/24/2020 13:02   DG Chest Portable 1 View  Result Date: 12/24/2020 CLINICAL DATA:  Shortness of breath and nonproductive cough for the past week. EXAM: PORTABLE CHEST 1 VIEW COMPARISON:  10/13/2020 FINDINGS: Normal sized heart. Stable post CABG changes. Stable linear  scarring at the lung bases. Stable right proximal humerus fixation hardware. IMPRESSION: No acute abnormality. Electronically Signed   By: Claudie Revering M.D.   On: 12/24/2020 12:12    Assessment/Plan There are no diagnoses linked to this encounter.   Family/ staff Communication:   Labs/tests ordered:    Subjective:   LAQUINTA WIDER is a 85 y.o. male who presents for Medicare Annual/Subsequent preventive examination.  Review of Systems     Cardiac Risk Factors include: advanced age (>51mn, >>15women);hypertension;male gender     Objective:    Today's Vitals   01/06/21 1428 01/09/21 1146  BP: (!) 145/80   Pulse: 92   Resp: 20   Temp: (!) 97.4 F (36.3 C)   SpO2: 96%   Weight: 170 lb 11.2 oz (77.4 kg)   Height: '5\' 9"'$  (1.753 m)   PainSc:  0-No pain   Body mass index is 25.21 kg/m.  Advanced Directives 01/06/2021 01/05/2021 01/02/2021 12/28/2020 12/26/2020 12/24/2020 12/21/2020  Does Patient Have a Medical Advance Directive? Yes Yes Yes Yes Yes Yes Yes  Type of AParamedicof APalmyraLiving will HCountry KnollsLiving will HAshkumLiving will HCrystal BayLiving will HWestportLiving will HFarmersburgLiving will HCharlestonLiving will  Does patient want  to make changes to medical advance directive? No - Patient declined No - Patient declined No - Patient declined No - Patient declined No - Patient declined - No - Patient declined  Copy of [place] in Chart? Yes - validated most recent copy scanned in chart (See row information) Yes - validated most recent copy scanned in chart (See row information) Yes - validated most recent copy scanned in chart (See row information) Yes - validated most recent copy scanned in chart (See row information) Yes - validated most recent copy scanned in chart (See row information) - Yes - validated most recent copy  scanned in chart (See row information)  Would patient like information on creating a medical advance directive? - - - - - - -    Current Medications (verified) Outpatient Encounter Medications as of 01/06/2021  Medication Sig   acetaminophen (TYLENOL) 500 MG tablet Take 500 mg by mouth 2 (two) times daily as needed for mild pain.   allopurinol (ZYLOPRIM) 100 MG tablet Take 100 mg by mouth daily.   Choline Fenofibrate (FENOFIBRIC ACID) 135 MG CPDR Take 1 tablet by mouth in the morning.   clopidogrel (PLAVIX) 75 MG tablet Take 75 mg by mouth daily.   colchicine 0.6 MG tablet Take 0.6 mg by mouth daily as needed (as directed for gout flares).   [EXPIRED] doxycycline (VIBRA-TABS) 100 MG tablet Take 100 mg by mouth 2 (two) times daily.   finasteride (PROSCAR) 5 MG tablet Take 5 mg by mouth every evening.   fluticasone (FLONASE) 50 MCG/ACT nasal spray Place 2 sprays into both nostrils daily.   ipratropium-albuterol (DUONEB) 0.5-2.5 (3) MG/3ML SOLN Take 3 mLs by nebulization every 6 (six) hours as needed.   metFORMIN (GLUCOPHAGE) 500 MG tablet Take 500 mg by mouth in the morning.   zinc oxide 20 % ointment Apply 1 application topically as needed for irritation.   No facility-administered encounter medications on file as of 01/06/2021.    Allergies (verified) Simvastatin   History: Past Medical History:  Diagnosis Date   Cancer ([place])    skin   CKD (chronic kidney disease) 06/24/2013   Cochlear implant in place    Coronary artery disease    a. s/p CABG 2000; b. myoview 9/08: inf-septal scar with mild peri-infarct ischemia (low risk);  c.  Echo 8/13: mild focal basal septal hypertrophy, Gr 1 DD, mild LAE;  d. Lexiscan Myoview (06/20/2013): No reversible ischemia, inferior and septal infarct, inferior and septal hypokinesis, EF 60%;  e.  Echo (06/20/2011): EF 50-55%, normal wall motion, grade 1 diastolic dysfunction, mild LAE   Diverticular disease    Duodenitis    Elevated PSA    GERD  (gastroesophageal reflux disease)    Gout    Humerus fracture    right   Hydronephrosis with ureteropelvic junction obstruction    Hypercalcemia 02/02/2012   a. 01/2012: 10.7.   Hyperlipidemia    Hypertension    Pancreatitis    a. ? Simvastatin related   Pneumonia age 33 or 7   "I think only once" (06/19/2013)   Syncope    a. 06/2013   TIA (transient ischemic attack)    a. 01/2012 - presentation as acute imbalance;  b. 01/2012 carotid U/S w/o signif stenosis;  c. 01/2012 Echo: EF 60-65%, Gr 1 DD.;  d.  Carotid US (06/20/2013): 1-39% bilateral ICA stenosis   Tubular adenoma polyp of rectum    Type II diabetes mellitus ([place])    "diet and  exercise controlled at present" (06/19/2013)   Past Surgical History:  Procedure Laterality Date   APPENDECTOMY     CARDIAC CATHETERIZATION  2000   CHOLECYSTECTOMY     COCHLEAR IMPLANT Right 1990's   COLONOSCOPY W/ POLYPECTOMY     no polyp 2012   CORONARY ARTERY BYPASS GRAFT  2000   CABG X5   INGUINAL HERNIA REPAIR Right 09/15/2015   Procedure: OPEN REPAIR RIGHT INGUINAL HERNIA ;  Surgeon: Jackolyn Confer, MD;  [facility];  Service: General;  Laterality: Right;   INSERTION OF MESH Right 09/15/2015   Procedure: INSERTION OF MESH;  Surgeon: Jackolyn Confer, MD;  [facility];  Service: General;  Laterality: Right;   PARATHYROIDECTOMY  1978   POLYPECTOMY     REVERSE SHOULDER ARTHROPLASTY Right 12/30/2014   Procedure: Right shoulder ORIF, Biomet S3 Plate and ZImmer cable system ;  Surgeon: Netta Cedars, MD;  Location: [facility];  Service: Orthopedics;  Laterality: Right;   right ankle surgery  age 77   TONSILLECTOMY  89   trachestomy as child for throat closing     Family History  Problem Relation Age of Onset   Diabetes Mother    Melanoma Father    Heart attack Father         in 40s   Social History   Socioeconomic History   Marital status: Widowed    Spouse name: Not on file   Number of children: 2   Years of education: college   Highest  education level: Not on file  Occupational History   Occupation: retired    Fish farm manager: RETIRED  Tobacco Use   Smoking status: Former    Types: Cigars   Smokeless tobacco: Never   Tobacco comments:    06/19/2013 "rare cigar", 12/07/20 quit  Substance and Sexual Activity   Alcohol use: Yes    Comment: 12/07/20 1-2 a week   Drug use: No   Sexual activity: Not Currently    Birth control/protection: Injection  Other Topics Concern   Not on file  Social History Narrative   12/07/20 lives at  [place]   Does get regular exercise   No caffeine   Social Determinants of Health   Financial Resource Strain: Low Risk    Difficulty of Paying Living Expenses: Not hard at all  Food Insecurity: No Food Insecurity   Worried About Charity fundraiser in the Last Year: Never true   [place] in the Last Year: Never true  Transportation Needs: No Transportation Needs   Lack of Transportation (Medical): No   Lack of Transportation (Non-Medical): No  Physical Activity: Not on file  Stress: No Stress Concern Present   Feeling of Stress : Only a little  Social Connections: Unknown   Frequency of Communication with Friends and Family: Three times a week   Frequency of Social Gatherings with Friends and Family: Once a week   Attends Religious Services: Never   Marine scientist or Organizations: No   Attends Archivist Meetings: Never   Marital Status: Patient refused    Tobacco Counseling Counseling given: Not Answered Tobacco comments: 06/19/2013 "rare cigar", 12/07/20 quit   Clinical Intake:  Pre-visit preparation completed: Yes  Pain : No/denies pain Pain Score: 0-No pain     BMI - recorded: 25.21 Nutritional Status: BMI 25 -29 Overweight Nutritional Risks: None Diabetes: No  How often do you need to have someone help you when you read instructions, pamphlets,  or other written materials from your doctor or pharmacy?: 3 -  Sometimes  Diabetic?No  Interpreter Needed?: No      Activities of Daily Living In your present state of health, do you have any difficulty performing the following activities: 01/09/2021 10/20/2020  Hearing? Tempie Donning  Vision? Y N  Difficulty concentrating or making decisions? Y N  Walking or climbing stairs? Y Y  Dressing or bathing? Y N  Doing errands, shopping? Y N  Preparing Food and eating ? Y -  Using the Toilet? Y -  In the past six months, have you accidently leaked urine? Y -  Do you have problems with loss of bowel control? Y -  Managing your Medications? Y -  Managing your Finances? Y -  Housekeeping or managing your Housekeeping? Y -  Some recent data might be hidden    Patient Care Team: [name], NP as PCP - General (Adult Health Nurse Practitioner) [name], MD as PCP - Cardiology (Cardiology)  Indicate any recent Medical Services you may have received from other than [facility] providers in the past year (date may be approximate).     Assessment:   This is a routine wellness examination for [place].  Hearing/Vision screen No results found.  Dietary issues and exercise activities discussed: Current Exercise Habits: The patient does not participate in regular exercise at present, Exercise limited by: neurologic condition(s)   Goals Addressed             This Visit's Progress    Maintain Mobility and Function   Not on track    Evidence-based guidance:  Emphasize the importance of physical activity and aerobic exercise as included in treatment plan; assess barriers to adherence; consider patient's abilities and preferences.  Encourage gradual increase in activity or exercise instead of stopping if pain occurs.  Reinforce individual therapy exercise prescription, such as strengthening, stabilization and stretching programs.  Promote optimal body mechanics to stabilize the spine with lifting and functional activity.  Encourage activity and mobility  modifications to facilitate optimal function, such as using a log roll for bed mobility or dressing from a seated position.  Reinforce individual adaptive equipment recommendations to limit excessive spinal movements, such as a Systems analyst.  Assess adequacy of sleep; encourage use of sleep hygiene techniques, such as bedtime routine; use of white noise; dark, cool bedroom; avoiding daytime naps, heavy meals or exercise before bedtime.  Promote positions and modification to optimize sleep and sexual activity; consider pillows or positioning devices to assist in maintaining neutral spine.  Explore options for applying ergonomic principles at work and home, such as frequent position changes, using ergonomically designed equipment and working at optimal height.  Promote modifications to increase comfort with driving such as lumbar support, optimizing seat and steering wheel position, using cruise control and taking frequent rest stops to stretch and walk.   Notes:        Depression Screen PHQ 2/9 Scores 01/09/2021 01/19/2020 01/15/2019 03/18/2017 03/05/2016 09/20/2014 07/23/2013  PHQ - 2 Score 0 0 0 0 0 0 0    Fall Risk Fall Risk  01/09/2021 12/07/2020 10/13/2020 08/23/2020 07/21/2019  Falls in the past year? - 1 1 0 0  Number falls in past yr: '1 1 1 '$ 0 0  Comment - abrasions - - -  Injury with Fall? 1 0 1 0 -  Comment - - left arm and knee pain - -  Risk for fall due to : History of fall(s);Impaired balance/gait;Impaired  mobility;Mental status change - - No Fall Risks -  Follow up Falls evaluation completed;Education provided;Falls prevention discussed - Falls evaluation completed Falls evaluation completed -  Comment - - - - -    FALL RISK PREVENTION PERTAINING TO THE HOME:  Any stairs in or around the home? No  If so, are there any without handrails? No  Home free of loose throw rugs in walkways, pet beds, electrical cords, etc? Yes  Adequate lighting in your home to reduce risk of falls? Yes    ASSISTIVE DEVICES UTILIZED TO PREVENT FALLS:  Life alert? No  Use of a cane, walker or w/c? Yes  Grab bars in the bathroom? Yes  Shower chair or bench in shower? Yes  Elevated toilet seat or a handicapped toilet? Yes   TIMED UP AND GO:  Was the test performed? No .  Length of time to ambulate 10 feet:  sec.   Gait unsteady without use of assistive device, provider informed and interventions were implemented  Cognitive Function: MMSE - Mini Mental State Exam 01/09/2021  Not completed: Unable to complete     6CIT Screen 01/09/2021  What Year? 0 points  What month? 0 points  What time? 3 points  Count back from 20 2 points  Months in reverse 2 points  Repeat phrase 6 points  Total Score 13    Immunizations Immunization History  Administered Date(s) Administered   Fluad Quad(high Dose 65+) 04/01/2019   Influenza Split 04/13/2011, 05/02/2012   Influenza Whole 05/22/2007, 04/12/2008, 05/02/2009, 04/14/2010   Influenza, High Dose Seasonal PF 04/17/2013, 03/16/2014, 03/05/2016, 03/18/2017   Influenza,inj,Quad PF,6+ Mos 03/02/2015, 03/18/2018   Influenza-Unspecified 03/18/2018   Moderna Sars-Covid-2 Vaccination 06/22/2019, 07/20/2019, 05/02/2020, 11/23/2020   PPD Test 01/02/2015   Pneumococcal Conjugate-13 12/29/2015   Pneumococcal Polysaccharide-23 04/19/2006   Tdap 09/04/2016   Zoster, Live 06/29/2015    TDAP status: Up to date  Flu Vaccine status: Up to date  Pneumococcal vaccine status: Up to date  Covid-19 vaccine status: Completed vaccines  Qualifies for Shingles Vaccine? Yes   Zostavax completed Yes   Shingrix Completed?: No.    Education has been provided regarding the importance of this vaccine. Patient has been advised to call insurance company to determine out of pocket expense if they have not yet received this vaccine. Advised may also receive vaccine at local pharmacy or Health Dept. Verbalized acceptance and understanding.  Screening Tests Health  Maintenance  Topic Date Due   Zoster Vaccines- Shingrix (1 of 2) Never done   URINE MICROALBUMIN  01/15/2020   FOOT EXAM  07/20/2020   INFLUENZA VACCINE  01/16/2021   HEMOGLOBIN A1C  04/23/2021   OPHTHALMOLOGY EXAM  08/31/2021   TETANUS/TDAP  09/05/2026   COVID-19 Vaccine  Completed   PNA vac Low Risk Adult  Completed   HPV VACCINES  Aged Out    Health Maintenance  Health Maintenance Due  Topic Date Due   Zoster Vaccines- Shingrix (1 of 2) Never done   URINE MICROALBUMIN  01/15/2020   FOOT EXAM  07/20/2020    Colorectal cancer screening: No longer required.   Lung Cancer Screening: (Low Dose CT Chest recommended if Age 37-80 years, 30 pack-year currently smoking OR have quit w/in 15years.) does not qualify.   Lung Cancer Screening Referral: No  Additional Screening:  Hepatitis C Screening: does not qualify; Completed   Vision Screening: Recommended annual ophthalmology exams for early detection of glaucoma and other disorders of the eye. Is the patient up  to date with their annual eye exam?  No  Who is the provider or what is the name of the office in which the patient attends annual eye exams? N/A- cannot recall If pt is not established with a provider, would they like to be referred to a provider to establish care? No .   Dental Screening: Recommended annual dental exams for proper oral hygiene  Community Resource Referral / Chronic Care Management: CRR required this visit?  No   CCM required this visit?  No      Plan:     I have personally reviewed and noted the following in the patient's chart:   Medical and social history Use of alcohol, tobacco or illicit drugs  Current medications and supplements including opioid prescriptions. Patient is not currently taking opioid prescriptions. Functional ability and status Nutritional status Physical activity Advanced directives List of other physicians Hospitalizations, surgeries, and ER visits in previous 12  months Vitals Screenings to include cognitive, depression, and falls Referrals and appointments  In addition, I have reviewed and discussed with patient certain preventive protocols, quality metrics, and best practice recommendations. A written personalized care plan for preventive services as well as general preventive health recommendations were provided to patient.     [name], NP   01/09/2021

## 2021-01-06 NOTE — Telephone Encounter (Signed)
Pt's daughter, Domingo Sep (on [place]) called, he is falling more, lower leg weakness and coordination issues and solely in a wheelchair. Would like a call from the nurse to discuss a sooner appt.

## 2021-01-09 ENCOUNTER — Ambulatory Visit: Payer: Federal, State, Local not specified - PPO | Admitting: Neurology

## 2021-01-09 NOTE — Telephone Encounter (Signed)
Called Juan [name] who stated he has 6 visits/2 weeks left on insurance for PT. Daughter has asked [facility] on site to document his need for more PT. The PT there is also working on [place] to approve more PT.  I advised her that is what Dr Leta Baptist would recommend to help him regain strength, coordination. She  stated she will call his insurance as well to find out what they may require to approve more PT, verbalized understanding, appreciation.

## 2021-01-09 NOTE — Patient Instructions (Signed)
  Mr. Juan [name] , Thank you for taking time to come for your Medicare Wellness Visit. I appreciate your ongoing commitment to your health goals. Please review the following plan we discussed and let me know if I can assist you in the future.   These are the goals we discussed:  Goals      Maintain Mobility and Function     Evidence-based guidance:  Emphasize the importance of physical activity and aerobic exercise as included in treatment plan; assess barriers to adherence; consider patient's abilities and preferences.  Encourage gradual increase in activity or exercise instead of stopping if pain occurs.  Reinforce individual therapy exercise prescription, such as strengthening, stabilization and stretching programs.  Promote optimal body mechanics to stabilize the spine with lifting and functional activity.  Encourage activity and mobility modifications to facilitate optimal function, such as using a log roll for bed mobility or dressing from a seated position.  Reinforce individual adaptive equipment recommendations to limit excessive spinal movements, such as a Systems analyst.  Assess adequacy of sleep; encourage use of sleep hygiene techniques, such as bedtime routine; use of white noise; dark, cool bedroom; avoiding daytime naps, heavy meals or exercise before bedtime.  Promote positions and modification to optimize sleep and sexual activity; consider pillows or positioning devices to assist in maintaining neutral spine.  Explore options for applying ergonomic principles at work and home, such as frequent position changes, using ergonomically designed equipment and working at optimal height.  Promote modifications to increase comfort with driving such as lumbar support, optimizing seat and steering wheel position, using cruise control and taking frequent rest stops to stretch and walk.   Notes:         This is a list of the screening recommended for you and due dates:  Health  Maintenance  Topic Date Due   Zoster (Shingles) Vaccine (1 of 2) Never done   Urine Protein Check  01/15/2020   Complete foot exam   07/20/2020   Flu Shot  01/16/2021   Hemoglobin A1C  04/23/2021   Eye exam for diabetics  08/31/2021   Tetanus Vaccine  09/05/2026   COVID-19 Vaccine  Completed   Pneumonia vaccines  Completed   HPV Vaccine  Aged Out

## 2021-01-10 ENCOUNTER — Other Ambulatory Visit: Payer: Self-pay | Admitting: Internal Medicine

## 2021-01-19 ENCOUNTER — Encounter: Payer: Self-pay | Admitting: Internal Medicine

## 2021-01-19 ENCOUNTER — Non-Acute Institutional Stay (SKILLED_NURSING_FACILITY): Payer: Federal, State, Local not specified - PPO | Admitting: Internal Medicine

## 2021-01-19 DIAGNOSIS — R634 Abnormal weight loss: Secondary | ICD-10-CM

## 2021-01-19 NOTE — Progress Notes (Signed)
Location: [facility] Room Number: 11 Place of Service:  SNF [phone])  Provider: Veleta Miners MD  Code Status: Full Code Goals of Care:  Advanced Directives 01/19/2021  Does Patient Have a Medical Advance Directive? Yes  Type of Paramedic of [place];Living will  Does patient want to make changes to medical advance directive? No - Patient declined  Copy of [place] in Chart? Yes - validated most recent copy scanned in chart (See row information)  Would patient like information on creating a medical advance directive? -     Chief Complaint  Patient presents with   Acute Visit    Weight loss    HPI: Patient is a 85 y.o. male seen today for an acute visit for Weight loss  Patient has a history of CAD, gout, HLD, hypertension and diabetes Patient was admitted in the hospital from 5/5-5/9 for Possible CVA  He made good progress with therapy and was able to transition to AL but he was falling in the AL requiring higher level of care Was seen by  Neurology. Their Note says ? Neurodegenerative disorder Parkinson vs Dementia He was also diagnosed with Acute Left Maxillary sinusitis. Treated with Augmentin Symptoms return and now on Doxycyline Feeling much better denies any acute symptoms   Weight loss Since been in SNF has lost almost 8 lbs He says he is eating well but not able to finish his meals as they have been serving big portions No Issue with Nausea or Dysphagia.  Continues to ne Mod Assist for his Ambulation Therapy plans to continue working with him Mild Assist for transfers Able to answer all Commands and is alert Past Medical History:  Diagnosis Date   Cancer ([place])    skin   CKD (chronic kidney disease) 06/24/2013   Cochlear implant in place    Coronary artery disease    a. s/p CABG 2000; b. myoview 9/08: inf-septal scar with mild peri-infarct ischemia (low risk);  c.  Echo 8/13: mild focal basal septal  hypertrophy, Gr 1 DD, mild LAE;  d. Lexiscan Myoview (06/20/2013): No reversible ischemia, inferior and septal infarct, inferior and septal hypokinesis, EF 60%;  e.  Echo (06/20/2011): EF 50-55%, normal wall motion, grade 1 diastolic dysfunction, mild LAE   Diverticular disease    Duodenitis    Elevated PSA    GERD (gastroesophageal reflux disease)    Gout    Humerus fracture    right   Hydronephrosis with ureteropelvic junction obstruction    Hypercalcemia 02/02/2012   a. 01/2012: 10.7.   Hyperlipidemia    Hypertension    Pancreatitis    a. ? Simvastatin related   Pneumonia age 15 or 7   "I think only once" (06/19/2013)   Syncope    a. 06/2013   TIA (transient ischemic attack)    a. 01/2012 - presentation as acute imbalance;  b. 01/2012 carotid U/S w/o signif stenosis;  c. 01/2012 Echo: EF 60-65%, Gr 1 DD.;  d.  Carotid US (06/20/2013): 1-39% bilateral ICA stenosis   Tubular adenoma polyp of rectum    Type II diabetes mellitus ([place])    "diet and exercise controlled at present" (06/19/2013)    Past Surgical History:  Procedure Laterality Date   APPENDECTOMY     CARDIAC CATHETERIZATION  2000   CHOLECYSTECTOMY     COCHLEAR IMPLANT Right 1990's   COLONOSCOPY W/ POLYPECTOMY     no polyp 2012   CORONARY ARTERY  BYPASS GRAFT  2000   CABG X5   INGUINAL HERNIA REPAIR Right 09/15/2015   Procedure: OPEN REPAIR RIGHT INGUINAL HERNIA ;  Surgeon: Jackolyn Confer, MD;  Location: [facility];  Service: General;  Laterality: Right;   INSERTION OF MESH Right 09/15/2015   Procedure: INSERTION OF MESH;  Surgeon: Jackolyn Confer, MD;  Location: [facility];  Service: General;  Laterality: Right;   PARATHYROIDECTOMY  1978   POLYPECTOMY     REVERSE SHOULDER ARTHROPLASTY Right 12/30/2014   Procedure: Right shoulder ORIF, Biomet S3 Plate and ZImmer cable system ;  Surgeon: Netta Cedars, MD;  Location: [facility];  Service: Orthopedics;  Laterality: Right;   right ankle surgery  age 50   TONSILLECTOMY  41   trachestomy as child  for throat closing      Allergies  Allergen Reactions   Simvastatin Other (See Comments)    Possible statin-induced pancreatitis    Outpatient Encounter Medications as of 01/19/2021  Medication Sig   acetaminophen (TYLENOL) 500 MG tablet Take 500 mg by mouth 2 (two) times daily as needed for mild pain.   allopurinol (ZYLOPRIM) 100 MG tablet Take 100 mg by mouth daily.   Choline Fenofibrate (FENOFIBRIC ACID) 135 MG CPDR Take 1 tablet by mouth in the morning.   clopidogrel (PLAVIX) 75 MG tablet Take 75 mg by mouth daily.   colchicine 0.6 MG tablet Take 0.6 mg by mouth daily as needed (as directed for gout flares).   finasteride (PROSCAR) 5 MG tablet Take 5 mg by mouth every evening.   fluticasone (FLONASE) 50 MCG/ACT nasal spray Place 2 sprays into both nostrils daily.   ipratropium-albuterol (DUONEB) 0.5-2.5 (3) MG/3ML SOLN Take 3 mLs by nebulization every 6 (six) hours as needed.   metFORMIN (GLUCOPHAGE) 500 MG tablet Take 500 mg by mouth in the morning.   zinc oxide 20 % ointment Apply 1 application topically as needed for irritation.   No facility-administered encounter medications on file as of 01/19/2021.    Review of Systems:  Review of Systems Review of Systems  Constitutional: Negative for activity change, appetite change, chills, diaphoresis, fatigue and fever.  HENT: Negative for mouth sores, postnasal drip, rhinorrhea, sinus pain and sore throat.   Respiratory: Negative for apnea, cough, chest tightness, shortness of breath and wheezing.   Cardiovascular: Negative for chest pain, palpitations and leg swelling.  Gastrointestinal: Negative for abdominal distention, abdominal pain, constipation, diarrhea, nausea and vomiting.  Genitourinary: Negative for dysuria and frequency.  Musculoskeletal: Negative for arthralgias, joint swelling and myalgias.  Skin: Negative for rash.  Neurological: Negative for dizziness, syncope, weakness, light-headedness and numbness.   Psychiatric/Behavioral: Negative for behavioral problems, confusion and sleep disturbance.    Health Maintenance  Topic Date Due   Zoster Vaccines- Shingrix (1 of 2) Never done   URINE MICROALBUMIN  01/15/2020   FOOT EXAM  07/20/2020   INFLUENZA VACCINE  01/16/2021   HEMOGLOBIN A1C  04/23/2021   OPHTHALMOLOGY EXAM  08/31/2021   TETANUS/TDAP  09/05/2026   COVID-19 Vaccine  Completed   PNA vac Low Risk Adult  Completed   HPV VACCINES  Aged Out    Physical Exam: Vitals:   01/19/21 1614  BP: 127/84  Pulse: 88  Resp: 16  Temp: 97.8 F (36.6 C)  SpO2: 93%  Weight: 166 lb 14.4 oz (75.7 kg)  Height: '5\' 9"'$  (1.753 m)   Body mass index is 24.65 kg/m. Physical Exam Constitutional:  Well-developed and well-nourished.  HENT:  Head: Normocephalic.  Mouth/Throat:  Oropharynx is clear and moist.  Eyes: Pupils are equal, round, and reactive to light.  Neck: Neck supple.  Cardiovascular: Normal rate and normal heart sounds.  No murmur heard. Pulmonary/Chest: Effort normal and breath sounds normal. No respiratory distress. No wheezes. She has no rales.  Abdominal: Soft. Bowel sounds are normal. No distension. There is no tenderness. There is no rebound.  Musculoskeletal: No edema.  Lymphadenopathy: none Neurological: No Focal Deficits Skin: Skin is warm and dry.  Psychiatric: Normal mood and affect. Behavior is normal. Thought content normal.   Labs reviewed: Basic Metabolic Panel: Recent Labs    10/22/20 0021 10/22/20 1222 12/24/20 1224 12/29/20 0000  NA 140 138 139 142  K 3.6 3.6 3.4* 3.7  CL 110 107 108 107  CO2 '24 24 25 '$ 28*  GLUCOSE 110* 121* 195*  --   BUN 31* 24* 34* 28*  CREATININE 1.73* 1.30* 1.32* 1.3  CALCIUM 9.7 10.0 10.2 10.3   Liver Function Tests: Recent Labs    08/23/20 1154 10/20/20 1315 12/24/20 1224 12/29/20 0000  AST 22 17 34 20  ALT '19 14 24 16  '$ ALKPHOS 36* 32* 31* 37  BILITOT 0.5 0.4 0.7  --   PROT 6.9 6.3* 7.0  --   ALBUMIN 4.0 3.7 3.4*  3.7   No results for input(s): LIPASE, AMYLASE in the last 8760 hours. No results for input(s): AMMONIA in the last 8760 hours. CBC: Recent Labs    08/23/20 1154 10/20/20 1315 12/24/20 1224 12/29/20 0000  WBC 5.6 6.3 5.3 8.3  NEUTROABS 3.1 3.9 3.9 5,810.00  HGB 13.0 12.5* 11.2* 12.5*  HCT 38.5* 39.1 35.4* 39*  MCV 87.6 92.7 90.5  --   PLT 264.0 265 234 378   Lipid Panel: Recent Labs    08/23/20 1154 10/21/20 0259  CHOL 154 135  HDL 38.40* 31*  LDLCALC  --  75  TRIG 202.0* 145  CHOLHDL 4 4.4  LDLDIRECT 96.0  --    Lab Results  Component Value Date   HGBA1C 7.1 (H) 10/21/2020    Procedures since last visit: CT Head Wo Contrast  Result Date: 12/24/2020 CLINICAL DATA:  Altered mental status. EXAM: CT HEAD WITHOUT CONTRAST TECHNIQUE: Contiguous axial images were obtained from the base of the skull through the vertex without intravenous contrast. COMPARISON:  For head CT 10/20/2020. FINDINGS: Brain: No evidence of acute infarction, hemorrhage, hydrocephalus, extra-axial collection or mass lesion/mass effect. Extensive streak artifact from the patient's right cochlear implant is noted. Chronic microvascular ischemic change and mild cortical atrophy are again seen. Vascular: No hyperdense vessel or unexpected calcification. Skull: Intact.  No focal lesion. Sinuses/Orbits: Complete opacification of the right maxillary sinus is again seen. Ethmoid air cell disease is much worse than on the prior examination. There is a new air-fluid level in the left maxillary sinus. Mild mucosal thickening in the sphenoid sinuses has also progressed. Other: None. IMPRESSION: No acute intracranial abnormality. Extensive sinus disease is much worse than on the comparison head CT and includes a new air-fluid level in the left maxillary sinus consistent with acute sinusitis. Atrophy and chronic microvascular ischemic change. Electronically Signed   By: Inge Rise M.D.   On: 12/24/2020 13:02   DG Chest  Portable 1 View  Result Date: 12/24/2020 CLINICAL DATA:  Shortness of breath and nonproductive cough for the past week. EXAM: PORTABLE CHEST 1 VIEW COMPARISON:  10/13/2020 FINDINGS: Normal sized heart. Stable post CABG changes. Stable linear scarring at the lung bases. Stable right  proximal humerus fixation hardware. IMPRESSION: No acute abnormality. Electronically Signed   By: Claudie Revering M.D.   On: 12/24/2020 12:12    Assessment/Plan Weight loss Seen For Weight loss per Nursing request Meds reviewed Discussed with patient  Will not make any changes for now. He is on Metformin but low dose Continue Dietary interventions and encouragement If weight loss continues will reval Labs done recently were all in Good Limits   Other issues Unstable gait with Recurent falls D/W Therapy and he has made some progress. And will benefit with continued therapy He continues to need Mod assist and not able to do his transfers.  Per therapy he is very responsive to therapy and cognitively able to follow Commands  CT scan in ED did not show any acute changes Cannot do MRI due to Auditory Implant   Stage 3a chronic kidney disease (HCC) Creat stable TIA (transient ischemic attack) On Plavix Allergic to statin On Fenofibrate History of gout On Allopurinol Diabetes mellitus without complication ([facility]CC) [id number] good on Metformin Benign prostatic hyperplasia  Symptoms controlled on Proscar   Labs/tests ordered:  * No order type specified * Next appt:  Visit date not found

## 2021-01-30 ENCOUNTER — Non-Acute Institutional Stay (SKILLED_NURSING_FACILITY): Payer: Federal, State, Local not specified - PPO | Admitting: Orthopedic Surgery

## 2021-01-30 DIAGNOSIS — R634 Abnormal weight loss: Secondary | ICD-10-CM

## 2021-01-30 DIAGNOSIS — E1122 Type 2 diabetes mellitus with diabetic chronic kidney disease: Secondary | ICD-10-CM | POA: Diagnosis not present

## 2021-01-30 DIAGNOSIS — E119 Type 2 diabetes mellitus without complications: Secondary | ICD-10-CM

## 2021-01-30 DIAGNOSIS — G459 Transient cerebral ischemic attack, unspecified: Secondary | ICD-10-CM

## 2021-01-30 DIAGNOSIS — R2681 Unsteadiness on feet: Secondary | ICD-10-CM | POA: Diagnosis not present

## 2021-01-30 DIAGNOSIS — E782 Mixed hyperlipidemia: Secondary | ICD-10-CM

## 2021-01-30 DIAGNOSIS — J0100 Acute maxillary sinusitis, unspecified: Secondary | ICD-10-CM

## 2021-01-30 DIAGNOSIS — N138 Other obstructive and reflux uropathy: Secondary | ICD-10-CM

## 2021-01-30 DIAGNOSIS — Z8739 Personal history of other diseases of the musculoskeletal system and connective tissue: Secondary | ICD-10-CM | POA: Diagnosis not present

## 2021-01-30 DIAGNOSIS — N401 Enlarged prostate with lower urinary tract symptoms: Secondary | ICD-10-CM

## 2021-01-30 DIAGNOSIS — N1831 Chronic kidney disease, stage 3a: Secondary | ICD-10-CM

## 2021-01-30 NOTE — Progress Notes (Signed)
Location:  [facility] Room Number: 11 Place of Service:  SNF ([phone]) Provider:  Windell Moulding, AGNP-C  [name], NP  Patient Care Team: [name], NP as PCP - General (Adult Health Nurse Practitioner) [name], MD as PCP - Cardiology (Cardiology)  Extended Emergency Contact Information Primary Emergency Contact: [name],[name]          [employer], [place] of [place] Phone: [phone] Mobile Phone: [phone] Relation: Daughter Secondary Emergency Contact: [name],[name] Address: [address]           [place], [place] [address] [name] of [place] Phone: [phone] Mobile Phone: [phone] Relation: Daughter  Code Status:  Full code Goals of care: Advanced Directive information Advanced Directives 01/19/2021  Does Patient Have a Medical Advance Directive? Yes  Type of Paramedic of [place];Living will  Does patient want to make changes to medical advance directive? No - Patient declined  Copy of [place] in Chart? Yes - validated most recent copy scanned in chart (See row information)  Would patient like information on creating a medical advance directive? -     Chief Complaint  Patient presents with   Medical Management of Chronic Issues    HPI:  Pt is a 85 y.o. male seen today for medical management of chronic diseases.    He currently resides on the skilled nursing unit at [facility]. Past medical history includes: CAD, HTN, TIA, diverticular disease, diabetes, HOH, CKD, BPH, and recurrent falls.   Sinusitis- denies nasal congestion today. Given Augmentin and doxycycline, remains on flonase nasal spray daily  Weight loss- reports eating 3 meals daily, will also have one snack, monthly weights Unstable gait- doing well with PT, moved back to SNF [place] due to frequent falls, ambulates with wheelchair most of day, will use walker for transfers, shuffling gait, poor  safety awareness CKD stage III- BUN/creat 28/1.28 12/29/2020 TIA- plavix 75 mg daily Gout- no recent flares, allopurinol 100 mg daily, prn colchicine, uric acid 7.8 01/19/2020 Diabetes- A1c 7.1 10/21/2020, metformin 500 mg daily, no recent hypoglycemic events BPH- no recent urinary retention, finasteride 5 mg daily HLD- LDL 75 10/21/2020, triglycerides 145, fenofibrate 135 mg daily  No recent falls, injuries or behavioral outbursts. BIMS 15/15 01/09/2021.   Recent blood pressures:  08/09- 105/60  08/06- 127/79  08/02- 127/84  Recent weights:  08/04- 166.9 lbs  08/02- 155.2 lbs  07/15- 172.4 lbs  06/01- 170.7 lbs  He looks forward to seeing his daughter and celebrating his birthday Wednesday.   Nurse does not report any other concerns, vitals stable.   Past Medical History:  Diagnosis Date   Cancer ([place])    skin   CKD (chronic kidney disease) 06/24/2013   Cochlear implant in place    Coronary artery disease    a. s/p CABG 2000; b. myoview 9/08: inf-septal scar with mild peri-infarct ischemia (low risk);  c.  Echo 8/13: mild focal basal septal hypertrophy, Gr 1 DD, mild LAE;  d. Lexiscan Myoview (06/20/2013): No reversible ischemia, inferior and septal infarct, inferior and septal hypokinesis, EF 60%;  e.  Echo (06/20/2011): EF 50-55%, normal wall motion, grade 1 diastolic dysfunction, mild LAE   Diverticular disease    Duodenitis    Elevated PSA    GERD (gastroesophageal reflux disease)    Gout    Humerus fracture    right   Hydronephrosis with ureteropelvic junction obstruction    Hypercalcemia  02/02/2012   a. 01/2012: 10.7.   Hyperlipidemia    Hypertension    Pancreatitis    a. ? Simvastatin related   Pneumonia age 75 or 7   "I think only once" (06/19/2013)   Syncope    a. 06/2013   TIA (transient ischemic attack)    a. 01/2012 - presentation as acute imbalance;  b. 01/2012 carotid U/S w/o signif stenosis;  c. 01/2012 Echo: EF 60-65%, Gr 1 DD.;  d.  Carotid US (06/20/2013):  1-39% bilateral ICA stenosis   Tubular adenoma polyp of rectum    Type II diabetes mellitus ([place])    "diet and exercise controlled at present" (06/19/2013)   Past Surgical History:  Procedure Laterality Date   APPENDECTOMY     CARDIAC CATHETERIZATION  2000   CHOLECYSTECTOMY     COCHLEAR IMPLANT Right 1990's   COLONOSCOPY W/ POLYPECTOMY     no polyp 2012   CORONARY ARTERY BYPASS GRAFT  2000   CABG X5   INGUINAL HERNIA REPAIR Right 09/15/2015   Procedure: OPEN REPAIR RIGHT INGUINAL HERNIA ;  Surgeon: Jackolyn Confer, MD;  Location: [facility];  Service: General;  Laterality: Right;   INSERTION OF MESH Right 09/15/2015   Procedure: INSERTION OF MESH;  Surgeon: Jackolyn Confer, MD;  Location: [facility];  Service: General;  Laterality: Right;   PARATHYROIDECTOMY  1978   POLYPECTOMY     REVERSE SHOULDER ARTHROPLASTY Right 12/30/2014   Procedure: Right shoulder ORIF, Biomet S3 Plate and ZImmer cable system ;  Surgeon: Netta Cedars, MD;  Location: [facility];  Service: Orthopedics;  Laterality: Right;   right ankle surgery  age 66   TONSILLECTOMY  27   trachestomy as child for throat closing      Allergies  Allergen Reactions   Simvastatin Other (See Comments)    Possible statin-induced pancreatitis    Outpatient Encounter Medications as of 01/30/2021  Medication Sig   acetaminophen (TYLENOL) 500 MG tablet Take 500 mg by mouth 2 (two) times daily as needed for mild pain.   allopurinol (ZYLOPRIM) 100 MG tablet Take 100 mg by mouth daily.   Choline Fenofibrate (FENOFIBRIC ACID) 135 MG CPDR Take 1 tablet by mouth in the morning.   clopidogrel (PLAVIX) 75 MG tablet Take 75 mg by mouth daily.   colchicine 0.6 MG tablet Take 0.6 mg by mouth daily as needed (as directed for gout flares).   finasteride (PROSCAR) 5 MG tablet Take 5 mg by mouth every evening.   fluticasone (FLONASE) 50 MCG/ACT nasal spray Place 2 sprays into both nostrils daily.   ipratropium-albuterol (DUONEB) 0.5-2.5 (3) MG/3ML SOLN Take 3  mLs by nebulization every 6 (six) hours as needed.   metFORMIN (GLUCOPHAGE) 500 MG tablet Take 500 mg by mouth in the morning.   zinc oxide 20 % ointment Apply 1 application topically as needed for irritation.   No facility-administered encounter medications on file as of 01/30/2021.    Review of Systems  Constitutional:  Negative for activity change, appetite change, fatigue and fever.  HENT:  Positive for hearing loss. Negative for congestion, sinus pressure, sinus pain and trouble swallowing.        Cochlear implant  Eyes:  Negative for visual disturbance.       Glasses  Respiratory:  Negative for cough, shortness of breath and wheezing.   Cardiovascular:  Negative for chest pain and leg swelling.  Gastrointestinal:  Negative for abdominal distention, abdominal pain, blood in stool, constipation, diarrhea and nausea.  Endocrine:  Negative for polydipsia, polyphagia and polyuria.  Genitourinary:  Negative for dysuria, frequency and hematuria.  Musculoskeletal:  Positive for arthralgias and gait problem.  Skin: Negative.   Neurological:  Positive for weakness. Negative for dizziness and headaches.  Hematological:  Bruises/bleeds easily.  Psychiatric/Behavioral:  Positive for confusion. Negative for dysphoric mood and sleep disturbance. The patient is not nervous/anxious.    Immunization History  Administered Date(s) Administered   Fluad Quad(high Dose 65+) 04/01/2019   Influenza Split 04/13/2011, 05/02/2012   Influenza Whole 05/22/2007, 04/12/2008, 05/02/2009, 04/14/2010   Influenza, High Dose Seasonal PF 04/17/2013, 03/16/2014, 03/05/2016, 03/18/2017   Influenza,inj,Quad PF,6+ Mos 03/02/2015, 03/18/2018   Influenza-Unspecified 03/18/2018   Moderna Sars-Covid-2 Vaccination 06/22/2019, 07/20/2019, 05/02/2020, 11/23/2020   PPD Test 01/02/2015   Pneumococcal Conjugate-13 12/29/2015   Pneumococcal Polysaccharide-23 04/19/2006   Tdap 09/04/2016   Zoster, Live 06/29/2015   Pertinent   Health Maintenance Due  Topic Date Due   URINE MICROALBUMIN  01/15/2020   FOOT EXAM  07/20/2020   INFLUENZA VACCINE  01/16/2021   HEMOGLOBIN A1C  04/23/2021   OPHTHALMOLOGY EXAM  08/31/2021   PNA vac Low Risk Adult  Completed   Fall Risk  01/09/2021 12/07/2020 10/13/2020 08/23/2020 07/21/2019  Falls in the past year? - 1 1 0 0  Number falls in past yr: '1 1 1 '$ 0 0  Comment - abrasions - - -  Injury with Fall? 1 0 1 0 -  Comment - - left arm and knee pain - -  Risk for fall due to : History of fall(s);Impaired balance/gait;Impaired mobility;Mental status change - - No Fall Risks -  Follow up Falls evaluation completed;Education provided;Falls prevention discussed - Falls evaluation completed Falls evaluation completed -  Comment - - - - -   Functional Status Survey:    Vitals:   01/30/21 1216  BP: 105/60  Pulse: 77  Resp: 18  Temp: 98.1 F (36.7 C)  SpO2: 96%  Weight: 166 lb 14.4 oz (75.7 kg)  Height: '5\' 9"'$  (1.753 m)   Body mass index is 24.65 kg/m. Physical Exam  Labs reviewed: Recent Labs    10/22/20 0021 10/22/20 1222 12/24/20 1224 12/29/20 0000  NA 140 138 139 142  K 3.6 3.6 3.4* 3.7  CL 110 107 108 107  CO2 '24 24 25 '$ 28*  GLUCOSE 110* 121* 195*  --   BUN 31* 24* 34* 28*  CREATININE 1.73* 1.30* 1.32* 1.3  CALCIUM 9.7 10.0 10.2 10.3   Recent Labs    08/23/20 1154 10/20/20 1315 12/24/20 1224 12/29/20 0000  AST 22 17 34 20  ALT '19 14 24 16  '$ ALKPHOS 36* 32* 31* 37  BILITOT 0.5 0.4 0.7  --   PROT 6.9 6.3* 7.0  --   ALBUMIN 4.0 3.7 3.4* 3.7   Recent Labs    08/23/20 1154 10/20/20 1315 12/24/20 1224 12/29/20 0000  WBC 5.6 6.3 5.3 8.3  NEUTROABS 3.1 3.9 3.9 5,810.00  HGB 13.0 12.5* 11.2* 12.5*  HCT 38.5* 39.1 35.4* 39*  MCV 87.6 92.7 90.5  --   PLT 264.0 265 234 378   Lab Results  Component Value Date   TSH 2.21 07/03/2016   Lab Results  Component Value Date   HGBA1C 7.1 (H) 10/21/2020   Lab Results  Component Value Date   CHOL 135 10/21/2020    HDL 31 (L) 10/21/2020   LDLCALC 75 10/21/2020   LDLDIRECT 96.0 08/23/2020   TRIG 145 10/21/2020   CHOLHDL 4.4 10/21/2020  Significant Diagnostic Results in last 30 days:  No results found.  Assessment/Plan 1. Unstable gait - shuffling gait at times, improved with PT - majority of ambulating with wheelchair, FWW for transfers - continues to have poor safety awareness - cont falls safety plan  2. TIA (transient ischemic attack) - CTA severe stenosis of right vertebral artery - cont Plavix 75 mg daily  3. History of gout - no recent flares - uric acid 7.8 01/19/2020 - allopurinol 100 mg daily and prn colchicine  4. Type 2 diabetes mellitus with stage 3a chronic kidney disease, without long-term current use of insulin (HCC) - hgb a1c 7.1 10/21/2020 - cont metformin 500 mg daily  5. Benign prostatic hyperplasia with urinary obstruction - no recent urinary retention - cont finasteride 5 mg daily - uric acid- future  6. Mixed hyperlipidemia - LDL 75 10/21/2020 - allergic to statin - cont fenofibrate 135 mg daily  7. Acute maxillary sinusitis, recurrence not specified - resolved with Augmentin and Doxycycline - cont Flonase  8. Weight loss - reports he is eating well - weight 08/02 155.2 lbs and 08/04 166.9lbs ??? - cont monthly weights and snacks between meals  Family/ staff Communication: plan discussed with patient and nurse  Labs/tests ordered:  uric acid

## 2021-01-31 ENCOUNTER — Encounter: Payer: Self-pay | Admitting: Orthopedic Surgery

## 2021-02-23 ENCOUNTER — Ambulatory Visit: Payer: Federal, State, Local not specified - PPO | Admitting: Internal Medicine

## 2021-02-28 ENCOUNTER — Encounter: Payer: Self-pay | Admitting: Nurse Practitioner

## 2021-02-28 ENCOUNTER — Non-Acute Institutional Stay (SKILLED_NURSING_FACILITY): Payer: Federal, State, Local not specified - PPO | Admitting: Nurse Practitioner

## 2021-02-28 DIAGNOSIS — N138 Other obstructive and reflux uropathy: Secondary | ICD-10-CM

## 2021-02-28 DIAGNOSIS — I10 Essential (primary) hypertension: Secondary | ICD-10-CM

## 2021-02-28 DIAGNOSIS — H9191 Unspecified hearing loss, right ear: Secondary | ICD-10-CM

## 2021-02-28 DIAGNOSIS — R600 Localized edema: Secondary | ICD-10-CM | POA: Insufficient documentation

## 2021-02-28 DIAGNOSIS — N401 Enlarged prostate with lower urinary tract symptoms: Secondary | ICD-10-CM

## 2021-02-28 DIAGNOSIS — E782 Mixed hyperlipidemia: Secondary | ICD-10-CM

## 2021-02-28 DIAGNOSIS — R609 Edema, unspecified: Secondary | ICD-10-CM

## 2021-02-28 DIAGNOSIS — Z8739 Personal history of other diseases of the musculoskeletal system and connective tissue: Secondary | ICD-10-CM

## 2021-02-28 DIAGNOSIS — N1831 Chronic kidney disease, stage 3a: Secondary | ICD-10-CM | POA: Diagnosis not present

## 2021-02-28 DIAGNOSIS — G459 Transient cerebral ischemic attack, unspecified: Secondary | ICD-10-CM

## 2021-02-28 DIAGNOSIS — E119 Type 2 diabetes mellitus without complications: Secondary | ICD-10-CM

## 2021-02-28 DIAGNOSIS — I2510 Atherosclerotic heart disease of native coronary artery without angina pectoris: Secondary | ICD-10-CM

## 2021-02-28 DIAGNOSIS — R296 Repeated falls: Secondary | ICD-10-CM

## 2021-02-28 DIAGNOSIS — R2689 Other abnormalities of gait and mobility: Secondary | ICD-10-CM

## 2021-02-28 DIAGNOSIS — R413 Other amnesia: Secondary | ICD-10-CM

## 2021-02-28 NOTE — Assessment & Plan Note (Signed)
TIA/CVA,  on ASA '81mg'$  alone, f/u Neurology. 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 Small lacunar infarct.

## 2021-02-28 NOTE — Assessment & Plan Note (Signed)
takes Finasteride,

## 2021-02-28 NOTE — Assessment & Plan Note (Signed)
In legs, no skin breakdown, lungs remains clear. Encourage elevate leg frequently, weight 2x /wk.

## 2021-02-28 NOTE — Assessment & Plan Note (Signed)
takes Allopurinol, prn Colchicine,  venous US was negative for DVT

## 2021-02-28 NOTE — Assessment & Plan Note (Signed)
Bun/creat 29/1.3 12/29/20

## 2021-02-28 NOTE — Assessment & Plan Note (Signed)
SNF [facility] for supportive care, f/u neurology.

## 2021-02-28 NOTE — Assessment & Plan Note (Signed)
Echo no intracardiac source of embolism

## 2021-02-28 NOTE — Assessment & Plan Note (Signed)
on DuoNeb prn, Fluticasone,

## 2021-02-28 NOTE — Assessment & Plan Note (Addendum)
Since 2013, therapy was beneficial.

## 2021-02-28 NOTE — Assessment & Plan Note (Signed)
on Fenofibric acid,  LDL 75 10/21/20

## 2021-02-28 NOTE — Assessment & Plan Note (Signed)
R cochlear implant.

## 2021-02-28 NOTE — Progress Notes (Signed)
Location:   SNF [place] Room Number: 11 Place of Service:  SNF (31) Provider: [facility] Meyer Dockery NP  [name], NP  Patient Care Team: [name], NP as PCP - General (Adult Health Nurse Practitioner) [name], MD as PCP - Cardiology (Cardiology)  Extended Emergency Contact Information Primary Emergency Contact: [place], [place] of [place] Phone: [phone] Mobile Phone: [phone] Relation: Daughter Secondary Emergency Contact: [name],[name] Address: [address]           [place], [place] [address] [name] of [place] Phone: [phone] Mobile Phone: [phone] Relation: Daughter  Code Status:  DNR Goals of care: Advanced Directive information Advanced Directives 01/19/2021  Does Patient Have a Medical Advance Directive? Yes  Type of Paramedic of [place];Living will  Does patient want to make changes to medical advance directive? No - Patient declined  Copy of [place] in Chart? Yes - validated most recent copy scanned in chart (See row information)  Would patient like information on creating a medical advance directive? -     Chief Complaint  Patient presents with   Medical Management of Chronic Issues    HPI:  Pt is a 85 y.o. male seen today for medical management of chronic diseases.      Sinusitis, on DuoNeb prn, Fluticasone             CKD Bun/creat 29/1.3 12/29/20             TIA/CVA,  on ASA '81mg'$  alone, f/u Neurology. 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 Small lacunar infarct.             Hx of Gout, takes Allopurinol, prn Colchicine,  venous US was negative for DVT             HTN, off meds             Gait abnormality, shuffling.             T2DM Hgb a1c 7.1 10/21/20, takes Metformin             Recurrent falls since 2013             CAD Echo no intracardiac source of embolism              Hyperlipidemia, on Fenofibric acid,  LDL 75 10/21/20             Memory issues f/u neurology             HOH, cochlear implant R             Urinary frequency, takes Finasteride,    Past Medical History:  Diagnosis Date   Cancer ([place])    skin   CKD (chronic kidney disease) 06/24/2013   Cochlear implant in place    Coronary artery disease    a. s/p CABG 2000; b. myoview 9/08: inf-septal scar with mild peri-infarct ischemia (low risk);  c.  Echo 8/13: mild focal basal septal hypertrophy, Gr 1 DD, mild LAE;  d. Lexiscan Myoview (06/20/2013): No reversible ischemia, inferior and septal infarct, inferior and septal hypokinesis, EF 60%;  e.  Echo (06/20/2011): EF 50-55%, normal wall motion, grade 1 diastolic dysfunction, mild LAE   Diverticular disease    Duodenitis    Elevated PSA    GERD (gastroesophageal reflux disease)  Gout    Humerus fracture    right   Hydronephrosis with ureteropelvic junction obstruction    Hypercalcemia 02/02/2012   a. 01/2012: 10.7.   Hyperlipidemia    Hypertension    Pancreatitis    a. ? Simvastatin related   Pneumonia age 38 or 7   "I think only once" (06/19/2013)   Syncope    a. 06/2013   TIA (transient ischemic attack)    a. 01/2012 - presentation as acute imbalance;  b. 01/2012 carotid U/S w/o signif stenosis;  c. 01/2012 Echo: EF 60-65%, Gr 1 DD.;  d.  Carotid US (06/20/2013): 1-39% bilateral ICA stenosis   Tubular adenoma polyp of rectum    Type II diabetes mellitus ([place])    "diet and exercise controlled at present" (06/19/2013)   Past Surgical History:  Procedure Laterality Date   APPENDECTOMY     CARDIAC CATHETERIZATION  2000   CHOLECYSTECTOMY     COCHLEAR IMPLANT Right 1990's   COLONOSCOPY W/ POLYPECTOMY     no polyp 2012   CORONARY ARTERY BYPASS GRAFT  2000   CABG X5   INGUINAL HERNIA REPAIR Right 09/15/2015   Procedure: OPEN REPAIR RIGHT INGUINAL HERNIA ;  Surgeon: Jackolyn Confer, MD;  [facility];  Service: General;  Laterality: Right;    INSERTION OF MESH Right 09/15/2015   Procedure: INSERTION OF MESH;  Surgeon: Jackolyn Confer, MD;  [facility];  Service: General;  Laterality: Right;   PARATHYROIDECTOMY  1978   POLYPECTOMY     REVERSE SHOULDER ARTHROPLASTY Right 12/30/2014   Procedure: Right shoulder ORIF, Biomet S3 Plate and ZImmer cable system ;  Surgeon: Netta Cedars, MD;  Location: [facility];  Service: Orthopedics;  Laterality: Right;   right ankle surgery  age 89   TONSILLECTOMY  34   trachestomy as child for throat closing      Allergies  Allergen Reactions   Simvastatin Other (See Comments)    Possible statin-induced pancreatitis    Allergies as of 02/28/2021       Reactions   Simvastatin Other (See Comments)   Possible statin-induced pancreatitis        Medication List        Accurate as of February 28, 2021  3:59 PM. If you have any questions, ask your nurse or doctor.          acetaminophen 500 MG tablet Commonly known as: TYLENOL Take 500 mg by mouth 2 (two) times daily as needed for mild pain.   allopurinol 100 MG tablet Commonly known as: ZYLOPRIM Take 100 mg by mouth daily.   clopidogrel 75 MG tablet Commonly known as: PLAVIX Take 75 mg by mouth daily.   colchicine 0.6 MG tablet Take 0.6 mg by mouth daily as needed (as directed for gout flares).   Fenofibric Acid 135 MG Cpdr Take 1 tablet by mouth in the morning.   finasteride 5 MG tablet Commonly known as: PROSCAR Take 5 mg by mouth every evening.   fluticasone 50 MCG/ACT nasal spray Commonly known as: FLONASE Place 2 sprays into both nostrils daily.   ipratropium-albuterol 0.5-2.5 (3) MG/3ML Soln Commonly known as: DUONEB Take 3 mLs by nebulization every 6 (six) hours as needed.   metFORMIN 500 MG tablet Commonly known as: GLUCOPHAGE Take 500 mg by mouth in the morning.   zinc oxide 20 % ointment Apply 1 application topically as needed for irritation.        Review of Systems  Constitutional:  Negative  for appetite change, fatigue and fever.  HENT:  Positive for hearing loss and rhinorrhea. Negative for congestion, facial swelling and trouble swallowing.        R cochlear implant  Eyes:  Negative for visual disturbance.  Respiratory:  Positive for cough and shortness of breath. Negative for wheezing.        DOE  Cardiovascular:  Positive for leg swelling.  Gastrointestinal:  Negative for abdominal pain and diarrhea.  Genitourinary:  Negative for dysuria, frequency and urgency.       1-2x/night.   Musculoskeletal:  Positive for arthralgias, back pain and gait problem.       Lower back pain, left leg pain.   Skin:  Negative for color change.  Neurological:  Negative for speech difficulty, weakness and light-headedness.       Memory lapses.   Psychiatric/Behavioral:  Negative for behavioral problems and sleep disturbance. The patient is not nervous/anxious.    Immunization History  Administered Date(s) Administered   Fluad Quad(high Dose 65+) 04/01/2019   Influenza Split 04/13/2011, 05/02/2012   Influenza Whole 05/22/2007, 04/12/2008, 05/02/2009, 04/14/2010   Influenza, High Dose Seasonal PF 04/17/2013, 03/16/2014, 03/05/2016, 03/18/2017   Influenza,inj,Quad PF,6+ Mos 03/02/2015, 03/18/2018   Influenza-Unspecified 03/18/2018   Moderna Sars-Covid-2 Vaccination 06/22/2019, 07/20/2019, 05/02/2020, 11/23/2020   PPD Test 01/02/2015   Pneumococcal Conjugate-13 12/29/2015   Pneumococcal Polysaccharide-23 04/19/2006   Tdap 09/04/2016   Zoster, Live 06/29/2015   Pertinent  Health Maintenance Due  Topic Date Due   URINE MICROALBUMIN  01/15/2020   FOOT EXAM  07/20/2020   INFLUENZA VACCINE  01/16/2021   HEMOGLOBIN A1C  04/23/2021   OPHTHALMOLOGY EXAM  08/31/2021   PNA vac Low Risk Adult  Completed   Fall Risk  01/09/2021 12/07/2020 10/13/2020 08/23/2020 07/21/2019  Falls in the past year? - 1 1 0 0  Number falls in past yr: '1 1 1 '$ 0 0  Comment - abrasions - - -  Injury with Fall? 1 0 1 0 -   Comment - - left arm and knee pain - -  Risk for fall due to : History of fall(s);Impaired balance/gait;Impaired mobility;Mental status change - - No Fall Risks -  Follow up Falls evaluation completed;Education provided;Falls prevention discussed - Falls evaluation completed Falls evaluation completed -  Comment - - - - -   Functional Status Survey:    Vitals:   02/28/21 1414  BP: 139/88  Pulse: 96  Resp: 20  Temp: (!) 97.5 F (36.4 C)  SpO2: 96%   There is no height or weight on file to calculate BMI. Physical Exam Vitals and nursing note reviewed.  Constitutional:      Appearance: Normal appearance.  HENT:     Head: Normocephalic and atraumatic.     Nose: Rhinorrhea present. No congestion.     Comments: No facial pain when pressed.     Mouth/Throat:     Mouth: Mucous membranes are moist.  Eyes:     Extraocular Movements: Extraocular movements intact.     Conjunctiva/sclera: Conjunctivae normal.     Pupils: Pupils are equal, round, and reactive to light.  Cardiovascular:     Rate and Rhythm: Normal rate and regular rhythm.     Heart sounds: No murmur heard. Pulmonary:     Effort: Pulmonary effort is normal.     Breath sounds: No wheezing, rhonchi or rales.  Abdominal:     General: Bowel sounds are normal.     Palpations: Abdomen is soft.  Tenderness: There is no guarding.  Musculoskeletal:        General: Normal range of motion.     Cervical back: Normal range of motion and neck supple.     Right lower leg: Edema present.     Left lower leg: Edema present.     Comments: !+ edema BLE  Skin:    General: Skin is warm and dry.  Neurological:     General: No focal deficit present.     Mental Status: He is alert and oriented to person, place, and time. Mental status is at baseline.     Motor: No weakness.     Coordination: Coordination normal.     Gait: Gait abnormal.  Psychiatric:        Mood and Affect: Mood normal.        Behavior: Behavior normal.         Thought Content: Thought content normal.    Labs reviewed: Recent Labs    10/22/20 0021 10/22/20 1222 12/24/20 1224 12/29/20 0000  NA 140 138 139 142  K 3.6 3.6 3.4* 3.7  CL 110 107 108 107  CO2 '24 24 25 '$ 28*  GLUCOSE 110* 121* 195*  --   BUN 31* 24* 34* 28*  CREATININE 1.73* 1.30* 1.32* 1.3  CALCIUM 9.7 10.0 10.2 10.3   Recent Labs    08/23/20 1154 10/20/20 1315 12/24/20 1224 12/29/20 0000  AST 22 17 34 20  ALT '19 14 24 16  '$ ALKPHOS 36* 32* 31* 37  BILITOT 0.5 0.4 0.7  --   PROT 6.9 6.3* 7.0  --   ALBUMIN 4.0 3.7 3.4* 3.7   Recent Labs    08/23/20 1154 10/20/20 1315 12/24/20 1224 12/29/20 0000  WBC 5.6 6.3 5.3 8.3  NEUTROABS 3.1 3.9 3.9 5,810.00  HGB 13.0 12.5* 11.2* 12.5*  HCT 38.5* 39.1 35.4* 39*  MCV 87.6 92.7 90.5  --   PLT 264.0 265 234 378   Lab Results  Component Value Date   TSH 2.21 07/03/2016   Lab Results  Component Value Date   HGBA1C 7.1 (H) 10/21/2020   Lab Results  Component Value Date   CHOL 135 10/21/2020   HDL 31 (L) 10/21/2020   LDLCALC 75 10/21/2020   LDLDIRECT 96.0 08/23/2020   TRIG 145 10/21/2020   CHOLHDL 4.4 10/21/2020    Significant Diagnostic Results in last 30 days:  No results found.  Assessment/Plan CKD (chronic kidney disease) Bun/creat 29/1.3 12/29/20  TIA (transient ischemic attack) TIA/CVA,  on ASA '81mg'$  alone, f/u Neurology. 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 Small lacunar infarct.  History of gout takes Allopurinol, prn Colchicine,  venous US was negative for DVT  Essential hypertension, benign Blood pressure is controlled with no medication.   Recurrent falls Since 2013, therapy was beneficial.   Shuffling gait Chronic, therapy is beneficial to him.   Chronic rhinitis on DuoNeb prn, Fluticasone,  Diabetes mellitus without complication (HCC) Hgb [id number] 7.1 10/21/20, takes Metformin  Coronary atherosclerosis Echo no intracardiac source  of embolism  Mixed hyperlipidemia on Fenofibric acid,  LDL 75 10/21/20  Memory deficit SNF [facility] for supportive care, f/u neurology.   HOH (hard of hearing) R cochlear implant.   Benign prostatic hyperplasia with urinary obstruction takes Finasteride,   Family/ staff Communication: plan of care reviewed with the patient and charge nurse.   Labs/tests ordered: none  Time spend 35 minutes.

## 2021-02-28 NOTE — Assessment & Plan Note (Signed)
Chronic, therapy is beneficial to him.

## 2021-02-28 NOTE — Assessment & Plan Note (Signed)
Hgb a1c 7.1 10/21/20, takes Metformin

## 2021-02-28 NOTE — Assessment & Plan Note (Signed)
Blood pressure is controlled with no medication.

## 2021-03-02 ENCOUNTER — Non-Acute Institutional Stay (SKILLED_NURSING_FACILITY): Payer: Federal, State, Local not specified - PPO | Admitting: Internal Medicine

## 2021-03-02 ENCOUNTER — Encounter: Payer: Self-pay | Admitting: Internal Medicine

## 2021-03-02 DIAGNOSIS — G8929 Other chronic pain: Secondary | ICD-10-CM | POA: Diagnosis not present

## 2021-03-02 DIAGNOSIS — R300 Dysuria: Secondary | ICD-10-CM

## 2021-03-02 DIAGNOSIS — M25561 Pain in right knee: Secondary | ICD-10-CM | POA: Diagnosis not present

## 2021-03-02 LAB — URIC ACID: Uric Acid: 6.1

## 2021-03-02 NOTE — Progress Notes (Signed)
Location:   [facility] Room Number: 11 Place of Service:  SNF [phone]) Provider:  Veleta Miners MD  [name], NP  Patient Care Team: [name], NP as PCP - General (Adult Health Nurse Practitioner) [name], MD as PCP - Cardiology (Cardiology)  Extended Emergency Contact Information Primary Emergency Contact: [place], [place] of [place] Phone: [phone] Mobile Phone: [phone] Relation: Daughter Secondary Emergency Contact: [name],[name] Address: [address]           [place], [place] [address] [name] of [place] Phone: [phone] Mobile Phone: [phone] Relation: Daughter  Code Status:  Full Code Goals of care: Advanced Directive information Advanced Directives 03/02/2021  Does Patient Have a Medical Advance Directive? Yes  Type of Paramedic of [place];Living will  Does patient want to make changes to medical advance directive? No - Patient declined  Copy of [place] in Chart? Yes - validated most recent copy scanned in chart (See row information)  Would patient like information on creating a medical advance directive? -     Chief Complaint  Patient presents with   Acute Visit    HPI:  Pt is a 85 y.o. male seen today for an acute visit for Confusion and Dysuria and Urinary Frequency Also Right knee pain  Patient has a history of CAD, gout, HLD, hypertension and diabetes Patient was admitted in the hospital from 5/5-5/9 for Possible CVA Cannot do MRI due to Auditory implant He initally got better but was readmitted in SNF for recurent falls No More Work up just therapy since then per Neurology   Patient has been in SNF since then Since yesterday he has been c/o Frequency and Dysuria Also more confused per Nurses. He is also c/o Pain in his Right leg He cannot walk anymore and uses Wheelchair to get around No Fever or  Hematuria  His mental status seemed at baseline to me today    Past Medical History:  Diagnosis Date   Cancer ([place])    skin   CKD (chronic kidney disease) 06/24/2013   Cochlear implant in place    Coronary artery disease    a. s/p CABG 2000; b. myoview 9/08: inf-septal scar with mild peri-infarct ischemia (low risk);  c.  Echo 8/13: mild focal basal septal hypertrophy, Gr 1 DD, mild LAE;  d. Lexiscan Myoview (06/20/2013): No reversible ischemia, inferior and septal infarct, inferior and septal hypokinesis, EF 60%;  e.  Echo (06/20/2011): EF 50-55%, normal wall motion, grade 1 diastolic dysfunction, mild LAE   Diverticular disease    Duodenitis    Elevated PSA    GERD (gastroesophageal reflux disease)    Gout    Humerus fracture    right   Hydronephrosis with ureteropelvic junction obstruction    Hypercalcemia 02/02/2012   a. 01/2012: 10.7.   Hyperlipidemia    Hypertension    Pancreatitis    a. ? Simvastatin related   Pneumonia age 46 or 7   "I think only once" (06/19/2013)   Syncope    a. 06/2013   TIA (transient ischemic attack)    a. 01/2012 - presentation as acute imbalance;  b. 01/2012 carotid U/S w/o signif stenosis;  c. 01/2012 Echo: EF 60-65%, Gr 1 DD.;  d.  Carotid US (06/20/2013): 1-39% bilateral ICA stenosis   Tubular adenoma polyp of rectum    Type II diabetes mellitus (  [place])    "diet and exercise controlled at present" (06/19/2013)   Past Surgical History:  Procedure Laterality Date   APPENDECTOMY     CARDIAC CATHETERIZATION  2000   CHOLECYSTECTOMY     COCHLEAR IMPLANT Right 1990's   COLONOSCOPY W/ POLYPECTOMY     no polyp 2012   CORONARY ARTERY BYPASS GRAFT  2000   CABG X5   INGUINAL HERNIA REPAIR Right 09/15/2015   Procedure: OPEN REPAIR RIGHT INGUINAL HERNIA ;  Surgeon: Jackolyn Confer, MD;  Location: [facility];  Service: General;  Laterality: Right;   INSERTION OF MESH Right 09/15/2015   Procedure: INSERTION OF MESH;  Surgeon: Jackolyn Confer, MD;  Location: [facility];   Service: General;  Laterality: Right;   PARATHYROIDECTOMY  1978   POLYPECTOMY     REVERSE SHOULDER ARTHROPLASTY Right 12/30/2014   Procedure: Right shoulder ORIF, Biomet S3 Plate and ZImmer cable system ;  Surgeon: Netta Cedars, MD;  Location: [facility];  Service: Orthopedics;  Laterality: Right;   right ankle surgery  age 40   TONSILLECTOMY  39   trachestomy as child for throat closing      Allergies  Allergen Reactions   Simvastatin Other (See Comments)    Possible statin-induced pancreatitis    Allergies as of 03/02/2021       Reactions   Simvastatin Other (See Comments)   Possible statin-induced pancreatitis        Medication List        Accurate as of March 02, 2021 10:44 AM. If you have any questions, ask your nurse or doctor.          acetaminophen 500 MG tablet Commonly known as: TYLENOL Take 500 mg by mouth 2 (two) times daily as needed for mild pain.   allopurinol 100 MG tablet Commonly known as: ZYLOPRIM Take 100 mg by mouth daily.   clopidogrel 75 MG tablet Commonly known as: PLAVIX Take 75 mg by mouth daily.   colchicine 0.6 MG tablet Take 0.6 mg by mouth daily as needed (as directed for gout flares).   Fenofibric Acid 135 MG Cpdr Take 1 tablet by mouth in the morning.   finasteride 5 MG tablet Commonly known as: PROSCAR Take 5 mg by mouth every evening.   fluticasone 50 MCG/ACT nasal spray Commonly known as: FLONASE Place 2 sprays into both nostrils daily.   ipratropium-albuterol 0.5-2.5 (3) MG/3ML Soln Commonly known as: DUONEB Take 3 mLs by nebulization every 6 (six) hours as needed.   metFORMIN 500 MG tablet Commonly known as: GLUCOPHAGE Take 500 mg by mouth in the morning.   zinc oxide 20 % ointment Apply 1 application topically as needed for irritation.        Review of Systems  Constitutional:  Positive for activity change.  HENT: Negative.    Respiratory: Negative.    Cardiovascular: Negative.   Gastrointestinal:  Negative.   Genitourinary:  Positive for dysuria, frequency and urgency. Negative for hematuria.  Musculoskeletal:  Positive for arthralgias, gait problem and myalgias.  Skin: Negative.   Neurological:  Negative for dizziness.  Psychiatric/Behavioral:  Positive for confusion.    Immunization History  Administered Date(s) Administered   Fluad Quad(high Dose 65+) 04/01/2019   Influenza Split 04/13/2011, 05/02/2012   Influenza Whole 05/22/2007, 04/12/2008, 05/02/2009, 04/14/2010   Influenza, High Dose Seasonal PF 04/17/2013, 03/16/2014, 03/05/2016, 03/18/2017   Influenza,inj,Quad PF,6+ Mos 03/02/2015, 03/18/2018   Influenza-Unspecified 03/18/2018   Moderna Sars-Covid-2 Vaccination 06/22/2019, 07/20/2019, 05/02/2020, 11/23/2020   PPD Test 01/02/2015  Pneumococcal Conjugate-13 12/29/2015   Pneumococcal Polysaccharide-23 04/19/2006   Tdap 09/04/2016   Zoster, Live 06/29/2015   Pertinent  Health Maintenance Due  Topic Date Due   URINE MICROALBUMIN  01/15/2020   FOOT EXAM  07/20/2020   INFLUENZA VACCINE  01/16/2021   HEMOGLOBIN A1C  04/23/2021   OPHTHALMOLOGY EXAM  08/31/2021   PNA vac Low Risk Adult  Completed   Fall Risk  01/09/2021 12/07/2020 10/13/2020 08/23/2020 07/21/2019  Falls in the past year? - 1 1 0 0  Number falls in past yr: '1 1 1 '$ 0 0  Comment - abrasions - - -  Injury with Fall? 1 0 1 0 -  Comment - - left arm and knee pain - -  Risk for fall due to : History of fall(s);Impaired balance/gait;Impaired mobility;Mental status change - - No Fall Risks -  Follow up Falls evaluation completed;Education provided;Falls prevention discussed - Falls evaluation completed Falls evaluation completed -  Comment - - - - -   Functional Status Survey:    Vitals:   03/02/21 1032  BP: 123/80  Pulse: 84  Resp: 17  Temp: (!) 97.1 F (36.2 C)  SpO2: 95%  Weight: 168 lb 3.2 oz (76.3 kg)  Height: '5\' 9"'$  (1.753 m)   Body mass index is 24.84 kg/m. Physical Exam Constitutional: Oriented  to person, place, and time. Well-developed and well-nourished.  HENT:  Head: Normocephalic.  Mouth/Throat: Oropharynx is clear and moist.  Eyes: Pupils are equal, round, and reactive to light.  Neck: Neck supple.  Cardiovascular: Normal rate and normal heart sounds.  No murmur heard. Pulmonary/Chest: Effort normal and breath sounds normal. No respiratory distress. No wheezes. has no rales.  Abdominal: Soft. Bowel sounds are normal. No distension. There is no tenderness. There is no rebound.  Musculoskeletal: No edema. Right Knee had no swelling or Redness or stiffness Lymphadenopathy: none Neurological: Alert and oriented to person, place, and time.  Skin: Skin is warm and dry.  Psychiatric: Normal mood and affect. Behavior is normal. Thought content normal.   Labs reviewed: Recent Labs    10/22/20 0021 10/22/20 1222 12/24/20 1224 12/29/20 0000  NA 140 138 139 142  K 3.6 3.6 3.4* 3.7  CL 110 107 108 107  CO2 '24 24 25 '$ 28*  GLUCOSE 110* 121* 195*  --   BUN 31* 24* 34* 28*  CREATININE 1.73* 1.30* 1.32* 1.3  CALCIUM 9.7 10.0 10.2 10.3   Recent Labs    08/23/20 1154 10/20/20 1315 12/24/20 1224 12/29/20 0000  AST 22 17 34 20  ALT '19 14 24 16  '$ ALKPHOS 36* 32* 31* 37  BILITOT 0.5 0.4 0.7  --   PROT 6.9 6.3* 7.0  --   ALBUMIN 4.0 3.7 3.4* 3.7   Recent Labs    08/23/20 1154 10/20/20 1315 12/24/20 1224 12/29/20 0000  WBC 5.6 6.3 5.3 8.3  NEUTROABS 3.1 3.9 3.9 5,810.00  HGB 13.0 12.5* 11.2* 12.5*  HCT 38.5* 39.1 35.4* 39*  MCV 87.6 92.7 90.5  --   PLT 264.0 265 234 378   Lab Results  Component Value Date   TSH 2.21 07/03/2016   Lab Results  Component Value Date   HGBA1C 7.1 (H) 10/21/2020   Lab Results  Component Value Date   CHOL 135 10/21/2020   HDL 31 (L) 10/21/2020   LDLCALC 75 10/21/2020   LDLDIRECT 96.0 08/23/2020   TRIG 145 10/21/2020   CHOLHDL 4.4 10/21/2020    Significant Diagnostic Results in last 30  days:  No results  found.  Assessment/Plan Dysuria Check UA and Culture  Chronic pain of right knee Most Likely due to Arthritis Xray of Knee Tylenol for Pain  Other Issues Unstable Gait Now mostly Wheelchair dependent Stage 3a chronic kidney disease (HCC) Creat stable TIA (transient ischemic attack) On Plavix Allergic to statin On Fenofibrate History of gout On Allopurinol Diabetes mellitus without complication (HCC) on Metformin. Will Need A1C Followed Benign prostatic hyperplasia  Symptoms controlled on Proscar  Family/ staff Communication:   Labs/tests ordered:

## 2021-03-06 ENCOUNTER — Encounter: Payer: Self-pay | Admitting: Orthopedic Surgery

## 2021-03-06 ENCOUNTER — Non-Acute Institutional Stay (SKILLED_NURSING_FACILITY): Payer: Federal, State, Local not specified - PPO | Admitting: Orthopedic Surgery

## 2021-03-06 DIAGNOSIS — G8929 Other chronic pain: Secondary | ICD-10-CM

## 2021-03-06 DIAGNOSIS — R296 Repeated falls: Secondary | ICD-10-CM | POA: Diagnosis not present

## 2021-03-06 DIAGNOSIS — M25561 Pain in right knee: Secondary | ICD-10-CM

## 2021-03-06 DIAGNOSIS — R2689 Other abnormalities of gait and mobility: Secondary | ICD-10-CM | POA: Diagnosis not present

## 2021-03-06 NOTE — Progress Notes (Signed)
Location:   [facility] Room Number: [place] of Service:  SNF (31) Provider:  Windell Moulding, NP  [name], NP  Patient Care Team: [name], NP as PCP - General (Adult Health Nurse Practitioner) [name], MD as PCP - Cardiology (Cardiology)  Extended Emergency Contact Information Primary Emergency Contact: [name],[name]          [place], [place] of [place] Phone: [phone] Mobile Phone: [phone] Relation: Daughter Secondary Emergency Contact: [name],[name] Address: [address]           [place], [place] [address] [name] of [place] Phone: [phone] Mobile Phone: [phone] Relation: Daughter  Code Status:  FULL CODE Goals of care: Advanced Directive information Advanced Directives 03/06/2021  Does Patient Have a Medical Advance Directive? Yes  Type of Paramedic of [place];Living will  Does patient want to make changes to medical advance directive? No - Patient declined  Copy of [place] in Chart? Yes - validated most recent copy scanned in chart (See row information)  Would patient like information on creating a medical advance directive? -     Chief Complaint  Patient presents with   Acute Visit    Right knee pain    HPI:  Pt is a 85 y.o. male seen today for an acute visit for right knee pain.   In the past few weeks he has had intermittent right knee pain. Xray right knee revealed no acute fracture or dislocation, mild to moderate degenerative changes with calcification of lateral meniscus, small joint effusion also present. He continues to have frequent falls due to poor safety awareness. He reports knee pain stimulated with movement in wheelchair and standing. Described pain as sharp. Nursing reports he will refuse tylenol from nursing staff. Treatment options discussed with Jeneen Rinks and daughter. They would like to start with conservative measures to treat  knee pain.   09/19, 09/15, 09/13 reported falls by nursing staff. Found on the floor by staff each time in the bathroom or beside wheelchair. Neuro checks normal. He continues to get up on his own without asking staff for help.     Past Medical History:  Diagnosis Date   Cancer ([place])    skin   CKD (chronic kidney disease) 06/24/2013   Cochlear implant in place    Coronary artery disease    a. s/p CABG 2000; b. myoview 9/08: inf-septal scar with mild peri-infarct ischemia (low risk);  c.  Echo 8/13: mild focal basal septal hypertrophy, Gr 1 DD, mild LAE;  d. Lexiscan Myoview (06/20/2013): No reversible ischemia, inferior and septal infarct, inferior and septal hypokinesis, EF 60%;  e.  Echo (06/20/2011): EF 50-55%, normal wall motion, grade 1 diastolic dysfunction, mild LAE   Diverticular disease    Duodenitis    Elevated PSA    GERD (gastroesophageal reflux disease)    Gout    Humerus fracture    right   Hydronephrosis with ureteropelvic junction obstruction    Hypercalcemia 02/02/2012   a. 01/2012: 10.7.   Hyperlipidemia    Hypertension    Pancreatitis    a. ? Simvastatin related   Pneumonia age 25 or 7   "I think only once" (06/19/2013)   Syncope    a. 06/2013   TIA (transient ischemic attack)    a. 01/2012 - presentation as acute imbalance;  b. 01/2012 carotid U/S w/o signif stenosis;  c. 01/2012 Echo: EF 60-65%, Gr 1  DD.;  d.  Carotid US (06/20/2013): 1-39% bilateral ICA stenosis   Tubular adenoma polyp of rectum    Type II diabetes mellitus ([place])    "diet and exercise controlled at present" (06/19/2013)   Past Surgical History:  Procedure Laterality Date   APPENDECTOMY     CARDIAC CATHETERIZATION  2000   CHOLECYSTECTOMY     COCHLEAR IMPLANT Right 1990's   COLONOSCOPY W/ POLYPECTOMY     no polyp 2012   CORONARY ARTERY BYPASS GRAFT  2000   CABG X5   INGUINAL HERNIA REPAIR Right 09/15/2015   Procedure: OPEN REPAIR RIGHT INGUINAL HERNIA ;  Surgeon: Jackolyn Confer, MD;  [facility]: [facility];  Service: General;  Laterality: Right;   INSERTION OF MESH Right 09/15/2015   Procedure: INSERTION OF MESH;  Surgeon: Jackolyn Confer, MD;  Location: [facility];  Service: General;  Laterality: Right;   PARATHYROIDECTOMY  1978   POLYPECTOMY     REVERSE SHOULDER ARTHROPLASTY Right 12/30/2014   Procedure: Right shoulder ORIF, Biomet S3 Plate and ZImmer cable system ;  Surgeon: Netta Cedars, MD;  Location: [facility];  Service: Orthopedics;  Laterality: Right;   right ankle surgery  age 15   TONSILLECTOMY  15   trachestomy as child for throat closing      Allergies  Allergen Reactions   Simvastatin Other (See Comments)    Possible statin-induced pancreatitis    Allergies as of 03/06/2021       Reactions   Simvastatin Other (See Comments)   Possible statin-induced pancreatitis        Medication List        Accurate as of March 06, 2021  4:19 PM. If you have any questions, ask your nurse or doctor.          acetaminophen 500 MG tablet Commonly known as: TYLENOL Take 500 mg by mouth 2 (two) times daily as needed for mild pain.   allopurinol 100 MG tablet Commonly known as: ZYLOPRIM Take 100 mg by mouth daily.   clopidogrel 75 MG tablet Commonly known as: PLAVIX Take 75 mg by mouth daily.   colchicine 0.6 MG tablet Take 0.6 mg by mouth daily as needed (as directed for gout flares).   Fenofibric Acid 135 MG Cpdr Take 1 tablet by mouth in the morning.   finasteride 5 MG tablet Commonly known as: PROSCAR Take 5 mg by mouth every evening.   fluticasone 50 MCG/ACT nasal spray Commonly known as: FLONASE Place 2 sprays into both nostrils daily.   ipratropium-albuterol 0.5-2.5 (3) MG/3ML Soln Commonly known as: DUONEB Take 3 mLs by nebulization every 6 (six) hours as needed.   metFORMIN 500 MG tablet Commonly known as: GLUCOPHAGE Take 500 mg by mouth in the morning.   Voltaren 1 % Gel Generic drug: diclofenac Sodium Apply topically 4 (four) times daily. Apply  to rt knee TID   zinc oxide 20 % ointment Apply 1 application topically as needed for irritation.        Review of Systems  Constitutional:  Negative for activity change, appetite change, fatigue and fever.  HENT:  Positive for hearing loss.   Respiratory:  Negative for cough, shortness of breath and wheezing.   Cardiovascular:  Negative for chest pain and leg swelling.  Musculoskeletal:  Positive for arthralgias, gait problem and myalgias.       Frequent falls  Neurological:  Positive for weakness. Negative for dizziness and light-headedness.  Psychiatric/Behavioral:  Negative for dysphoric mood. The patient is not  nervous/anxious.    Immunization History  Administered Date(s) Administered   Fluad Quad(high Dose 65+) 04/01/2019   Influenza Split 04/13/2011, 05/02/2012   Influenza Whole 05/22/2007, 04/12/2008, 05/02/2009, 04/14/2010   Influenza, High Dose Seasonal PF 04/17/2013, 03/16/2014, 03/05/2016, 03/18/2017   Influenza,inj,Quad PF,6+ Mos 03/02/2015, 03/18/2018   Influenza-Unspecified 03/18/2018   Moderna Sars-Covid-2 Vaccination 06/22/2019, 07/20/2019, 05/02/2020, 11/23/2020   PPD Test 01/02/2015   Pneumococcal Conjugate-13 12/29/2015   Pneumococcal Polysaccharide-23 04/19/2006   Tdap 09/04/2016   Zoster, Live 06/29/2015   Pertinent  Health Maintenance Due  Topic Date Due   URINE MICROALBUMIN  01/15/2020   FOOT EXAM  07/20/2020   INFLUENZA VACCINE  01/16/2021   HEMOGLOBIN A1C  04/23/2021   OPHTHALMOLOGY EXAM  08/31/2021   Fall Risk  01/09/2021 12/07/2020 10/13/2020 08/23/2020 07/21/2019  Falls in the past year? - 1 1 0 0  Number falls in past yr: '1 1 1 '$ 0 0  Comment - abrasions - - -  Injury with Fall? 1 0 1 0 -  Comment - - left arm and knee pain - -  Risk for fall due to : History of fall(s);Impaired balance/gait;Impaired mobility;Mental status change - - No Fall Risks -  Follow up Falls evaluation completed;Education provided;Falls prevention discussed - Falls  evaluation completed Falls evaluation completed -  Comment - - - - -   Functional Status Survey:    Vitals:   03/06/21 1612  BP: 126/70  Pulse: 89  Resp: 20  Temp: 97.7 F (36.5 C)  SpO2: 97%  Weight: 167 lb (75.8 kg)  Height: '5\' 9"'$  (1.753 m)   Body mass index is 24.66 kg/m. Physical Exam Vitals reviewed.  Constitutional:      General: He is not in acute distress. HENT:     Head: Normocephalic.     Right Ear: There is no impacted cerumen.     Left Ear: There is no impacted cerumen.  Cardiovascular:     Pulses: Normal pulses.     Heart sounds: Normal heart sounds.  Pulmonary:     Effort: Pulmonary effort is normal.     Breath sounds: Normal breath sounds.  Musculoskeletal:     Right knee: No swelling, erythema or crepitus. Normal range of motion. Tenderness present over the MCL.     Left knee: No swelling or erythema. Normal range of motion. No tenderness.     Right lower leg: No edema.     Left lower leg: No edema.  Skin:    General: Skin is warm and dry.     Capillary Refill: Capillary refill takes less than 2 seconds.  Neurological:     General: No focal deficit present.     Mental Status: He is alert. Mental status is at baseline.     Motor: Weakness present.     Gait: Gait abnormal.     Comments: wheelchair  Psychiatric:        Mood and Affect: Mood normal.        Behavior: Behavior normal.    Labs reviewed: Recent Labs    10/22/20 0021 10/22/20 1222 12/24/20 1224 12/29/20 0000  NA 140 138 139 142  K 3.6 3.6 3.4* 3.7  CL 110 107 108 107  CO2 '24 24 25 '$ 28*  GLUCOSE 110* 121* 195*  --   BUN 31* 24* 34* 28*  CREATININE 1.73* 1.30* 1.32* 1.3  CALCIUM 9.7 10.0 10.2 10.3   Recent Labs    08/23/20 1154 10/20/20 1315 12/24/20 1224 12/29/20 0000  AST 22 17 34 20  ALT '19 14 24 16  '$ ALKPHOS 36* 32* 31* 37  BILITOT 0.5 0.4 0.7  --   PROT 6.9 6.3* 7.0  --   ALBUMIN 4.0 3.7 3.4* 3.7   Recent Labs    08/23/20 1154 10/20/20 1315 12/24/20 1224  12/29/20 0000  WBC 5.6 6.3 5.3 8.3  NEUTROABS 3.1 3.9 3.9 5,810.00  HGB 13.0 12.5* 11.2* 12.5*  HCT 38.5* 39.1 35.4* 39*  MCV 87.6 92.7 90.5  --   PLT 264.0 265 234 378   Lab Results  Component Value Date   TSH 2.21 07/03/2016   Lab Results  Component Value Date   HGBA1C 7.1 (H) 10/21/2020   Lab Results  Component Value Date   CHOL 135 10/21/2020   HDL 31 (L) 10/21/2020   LDLCALC 75 10/21/2020   LDLDIRECT 96.0 08/23/2020   TRIG 145 10/21/2020   CHOLHDL 4.4 10/21/2020    Significant Diagnostic Results in last 30 days:  No results found.  Assessment/Plan: 1. Chronic pain of right knee - xray revealed mild to moderate degenerative changes in right knee - some tenderness along MCL otherwise unremarkable exam - start tylenol 1000 mg po bid  - start voltaren gel 1 %- apply to right knee tid  2. Recurrent falls - suspect due to knee pain - has been known to be non-compliant with call bell - cont skilled nursing care - cont falls safety plan  3. Shuffling gait - followed by Dr. Leta Baptist- possible progressive neurodegenerative disease - ambulating with wheelchair for past few months - next f/u 10/26   Family/ staff Communication: plan discussed with patient and daughter  Labs/tests ordered:  none

## 2021-03-16 ENCOUNTER — Encounter: Payer: Self-pay | Admitting: Internal Medicine

## 2021-03-16 LAB — HEMOGLOBIN A1C: Hemoglobin A1C: 5.7

## 2021-03-20 ENCOUNTER — Encounter: Payer: Self-pay | Admitting: Internal Medicine

## 2021-03-24 ENCOUNTER — Other Ambulatory Visit: Payer: Self-pay | Admitting: Orthopedic Surgery

## 2021-03-24 ENCOUNTER — Telehealth: Payer: Self-pay | Admitting: Orthopedic Surgery

## 2021-03-24 LAB — HEMOGLOBIN A1C: Hemoglobin A1C: 5.6

## 2021-03-24 NOTE — Telephone Encounter (Signed)
Hemoglobin A1c 5.6 03/23/2021. Results discussed with daughter Arbie Cookey. She would like to see how Clair Gulling does without metformin. Metformin discontinued. Orders placed to recheck a1c in 3 months.

## 2021-03-26 ENCOUNTER — Other Ambulatory Visit: Payer: Self-pay | Admitting: Internal Medicine

## 2021-03-28 ENCOUNTER — Encounter: Payer: Self-pay | Admitting: Nurse Practitioner

## 2021-03-28 ENCOUNTER — Non-Acute Institutional Stay (SKILLED_NURSING_FACILITY): Payer: Federal, State, Local not specified - PPO | Admitting: Nurse Practitioner

## 2021-03-28 DIAGNOSIS — G459 Transient cerebral ischemic attack, unspecified: Secondary | ICD-10-CM

## 2021-03-28 DIAGNOSIS — R2689 Other abnormalities of gait and mobility: Secondary | ICD-10-CM

## 2021-03-28 DIAGNOSIS — E119 Type 2 diabetes mellitus without complications: Secondary | ICD-10-CM

## 2021-03-28 DIAGNOSIS — N138 Other obstructive and reflux uropathy: Secondary | ICD-10-CM

## 2021-03-28 DIAGNOSIS — J310 Chronic rhinitis: Secondary | ICD-10-CM

## 2021-03-28 DIAGNOSIS — N401 Enlarged prostate with lower urinary tract symptoms: Secondary | ICD-10-CM

## 2021-03-28 DIAGNOSIS — N1831 Chronic kidney disease, stage 3a: Secondary | ICD-10-CM | POA: Diagnosis not present

## 2021-03-28 DIAGNOSIS — L02222 Furuncle of back [any part, except buttock]: Secondary | ICD-10-CM | POA: Insufficient documentation

## 2021-03-28 DIAGNOSIS — M159 Polyosteoarthritis, unspecified: Secondary | ICD-10-CM

## 2021-03-28 DIAGNOSIS — R296 Repeated falls: Secondary | ICD-10-CM

## 2021-03-28 DIAGNOSIS — I2510 Atherosclerotic heart disease of native coronary artery without angina pectoris: Secondary | ICD-10-CM

## 2021-03-28 DIAGNOSIS — E782 Mixed hyperlipidemia: Secondary | ICD-10-CM

## 2021-03-28 DIAGNOSIS — Z8739 Personal history of other diseases of the musculoskeletal system and connective tissue: Secondary | ICD-10-CM

## 2021-03-28 DIAGNOSIS — R634 Abnormal weight loss: Secondary | ICD-10-CM

## 2021-03-28 NOTE — Assessment & Plan Note (Signed)
takes Allopurinol, prn Colchicine,  venous US was negative for DVT. Uric acid 6.1 02/02/21

## 2021-03-28 NOTE — Assessment & Plan Note (Signed)
takes Plavix,  f/u Neurology. 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 Small lacunar infarct.

## 2021-03-28 NOTE — Assessment & Plan Note (Signed)
Hgb a1c 7.1 10/21/20

## 2021-03-28 NOTE — Assessment & Plan Note (Signed)
Stable, on DuoNeb prn, Fluticasone

## 2021-03-28 NOTE — Assessment & Plan Note (Signed)
Bun/creat 29/1.3 12/29/20

## 2021-03-28 NOTE — Assessment & Plan Note (Signed)
a boil in the left lower back, able to drainage with pressure, mild redness noted. Warm compress 20 min each, qid x5 days.

## 2021-03-28 NOTE — Assessment & Plan Note (Signed)
Recurrent falls since 2013

## 2021-03-28 NOTE — Assessment & Plan Note (Signed)
on Fenofibric acid,  LDL 75 10/21/20

## 2021-03-28 NOTE — Assessment & Plan Note (Signed)
Echo no intracardiac source of embolism

## 2021-03-28 NOTE — Assessment & Plan Note (Signed)
Gait abnormality, shuffling, neurodegenerative disease, w/c for mobility.

## 2021-03-28 NOTE — Assessment & Plan Note (Signed)
R knee pain, takes Tylenol, Voltaren gel

## 2021-03-28 NOTE — Progress Notes (Signed)
Location:   [facility] Room Number: [place] of Service:  SNF (31) Provider: Marlana Latus NP    Patient Care Team: [name], NP as PCP - General (Adult Health Nurse Practitioner) [name], MD as PCP - Cardiology (Cardiology)  Extended Emergency Contact Information Primary Emergency Contact: [place], [place] of [place] Phone: [phone] Mobile Phone: [phone] Relation: Daughter Secondary Emergency Contact: [name],[name] Address: [address]           [place], [place] [address] [name] of [place] Phone: [phone] Mobile Phone: [phone] Relation: Daughter  Code Status:  DNR Goals of care: Advanced Directive information Advanced Directives 03/28/2021  Does Patient Have a Medical Advance Directive? Yes  Type of Advance Directive Living will;Healthcare Power of Attorney  Does patient want to make changes to medical advance directive? No - Patient declined  Copy of [place] in Chart? Yes - validated most recent copy scanned in chart (See row information)  Would patient like information on creating a medical advance directive? -     Chief Complaint  Patient presents with   Acute Visit    Left side boil.     HPI:  Pt is a 85 y.o. male seen today for an acute visit for a boil in the left lower back, able to drainage with pressure, mild redness noted.     Sinusitis, on DuoNeb prn, Fluticasone             CKD Bun/creat 29/1.3 12/29/20             TIA/CVA,  takes Plavix,  f/u Neurology. 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 Small lacunar infarct.             Hx of Gout, takes Allopurinol, prn Colchicine,  venous US was negative for DVT. Uric acid 6.1 02/02/21             HTN, off meds             Gait abnormality, shuffling, neurodegenerative disease, w/c for mobility.              T2DM Hgb a1c 7.1  10/21/20             Recurrent falls since 2013             CAD Echo no intracardiac source of embolism             Hyperlipidemia, on Fenofibric acid,  LDL 75 10/21/20             Memory issues f/u neurology             HOH, cochlear implant R             Urinary frequency, takes Finasteride  OA, R knee pain, takes Tylenol, Voltaren gel  Past Medical History:  Diagnosis Date   Cancer ([place])    skin   CKD (chronic kidney disease) 06/24/2013   Cochlear implant in place    Coronary artery disease    a. s/p CABG 2000; b. myoview 9/08: inf-septal scar with mild peri-infarct ischemia (low risk);  c.  Echo 8/13: mild focal basal septal hypertrophy, Gr 1 DD, mild LAE;  d. Lexiscan Myoview (06/20/2013): No reversible ischemia, inferior and septal infarct, inferior and septal hypokinesis, EF 60%;  e.  Echo (06/20/2011): EF 50-55%, normal  wall motion, grade 1 diastolic dysfunction, mild LAE   Diverticular disease    Duodenitis    Elevated PSA    GERD (gastroesophageal reflux disease)    Gout    Humerus fracture    right   Hydronephrosis with ureteropelvic junction obstruction    Hypercalcemia 02/02/2012   a. 01/2012: 10.7.   Hyperlipidemia    Hypertension    Pancreatitis    a. ? Simvastatin related   Pneumonia age 36 or 7   "I think only once" (06/19/2013)   Syncope    a. 06/2013   TIA (transient ischemic attack)    a. 01/2012 - presentation as acute imbalance;  b. 01/2012 carotid U/S w/o signif stenosis;  c. 01/2012 Echo: EF 60-65%, Gr 1 DD.;  d.  Carotid US (06/20/2013): 1-39% bilateral ICA stenosis   Tubular adenoma polyp of rectum    Type II diabetes mellitus ([place])    "diet and exercise controlled at present" (06/19/2013)   Past Surgical History:  Procedure Laterality Date   APPENDECTOMY     CARDIAC CATHETERIZATION  2000   CHOLECYSTECTOMY     COCHLEAR IMPLANT Right 1990's   COLONOSCOPY W/ POLYPECTOMY     no polyp 2012   CORONARY ARTERY BYPASS GRAFT  2000   CABG X5   INGUINAL HERNIA REPAIR  Right 09/15/2015   Procedure: OPEN REPAIR RIGHT INGUINAL HERNIA ;  Surgeon: Jackolyn Confer, MD;  Location: [facility];  Service: General;  Laterality: Right;   INSERTION OF MESH Right 09/15/2015   Procedure: INSERTION OF MESH;  Surgeon: Jackolyn Confer, MD;  Location: [facility];  Service: General;  Laterality: Right;   PARATHYROIDECTOMY  1978   POLYPECTOMY     REVERSE SHOULDER ARTHROPLASTY Right 12/30/2014   Procedure: Right shoulder ORIF, Biomet S3 Plate and ZImmer cable system ;  Surgeon: Netta Cedars, MD;  Location: [facility];  Service: Orthopedics;  Laterality: Right;   right ankle surgery  age 76   TONSILLECTOMY  56   trachestomy as child for throat closing      Allergies  Allergen Reactions   Simvastatin Other (See Comments)    Possible statin-induced pancreatitis    Allergies as of 03/28/2021       Reactions   Simvastatin Other (See Comments)   Possible statin-induced pancreatitis        Medication List        Accurate as of March 28, 2021 11:59 PM. If you have any questions, ask your nurse or doctor.          acetaminophen 500 MG tablet Commonly known as: TYLENOL Take 500 mg by mouth 2 (two) times daily as needed for mild pain.   allopurinol 100 MG tablet Commonly known as: ZYLOPRIM Take 100 mg by mouth daily.   clopidogrel 75 MG tablet Commonly known as: PLAVIX Take 75 mg by mouth daily.   colchicine 0.6 MG tablet Take 0.6 mg by mouth daily as needed (as directed for gout flares).   diclofenac Sodium 1 % Gel Commonly known as: VOLTAREN Apply topically 4 (four) times daily. Apply to rt knee TID   Fenofibric Acid 135 MG Cpdr Take 1 tablet by mouth in the morning.   finasteride 5 MG tablet Commonly known as: PROSCAR Take 5 mg by mouth every evening.   fluticasone 50 MCG/ACT nasal spray Commonly known as: FLONASE Place 2 sprays into both nostrils daily.   ipratropium-albuterol 0.5-2.5 (3) MG/3ML Soln Commonly known as: DUONEB Take 3 mLs by nebulization  every  6 (six) hours as needed.   zinc oxide 20 % ointment Apply 1 application topically as needed for irritation.        Review of Systems  Constitutional:  Positive for unexpected weight change. Negative for appetite change, fatigue and fever.       Weight loss #15Ibs in one month?  HENT:  Positive for hearing loss and rhinorrhea. Negative for congestion and trouble swallowing.        R cochlear implant  Eyes:  Negative for visual disturbance.  Respiratory:  Negative for cough, shortness of breath and wheezing.        DOE  Cardiovascular:  Negative for leg swelling.  Gastrointestinal:  Negative for abdominal pain.  Genitourinary:  Negative for dysuria, frequency and urgency.       1-2x/night.   Musculoskeletal:  Positive for arthralgias, back pain and gait problem.       Lower back pain, R knee pain  Skin:        Left lower back boil.   Neurological:  Negative for speech difficulty, weakness and headaches.       Memory lapses.   Psychiatric/Behavioral:  Negative for confusion and sleep disturbance. The patient is not nervous/anxious.    Immunization History  Administered Date(s) Administered   Fluad Quad(high Dose 65+) 04/01/2019   Influenza Split 04/13/2011, 05/02/2012   Influenza Whole 05/22/2007, 04/12/2008, 05/02/2009, 04/14/2010   Influenza, High Dose Seasonal PF 04/17/2013, 03/16/2014, 03/05/2016, 03/18/2017   Influenza,inj,Quad PF,6+ Mos 03/02/2015, 03/18/2018   Influenza-Unspecified 03/18/2018   Moderna Sars-Covid-2 Vaccination 06/22/2019, 07/20/2019, 05/02/2020, 11/23/2020   PPD Test 01/02/2015   Pneumococcal Conjugate-13 12/29/2015   Pneumococcal Polysaccharide-23 04/19/2006   Tdap 09/04/2016   Zoster, Live 06/29/2015   Pertinent  Health Maintenance Due  Topic Date Due   URINE MICROALBUMIN  01/15/2020   FOOT EXAM  07/20/2020   INFLUENZA VACCINE  01/16/2021   OPHTHALMOLOGY EXAM  08/31/2021   HEMOGLOBIN A1C  09/22/2021   Fall Risk  01/09/2021 12/07/2020  10/13/2020 08/23/2020 07/21/2019  Falls in the past year? - 1 1 0 0  Number falls in past yr: 1 1 1  0 0  Comment - abrasions - - -  Injury with Fall? 1 0 1 0 -  Comment - - left arm and knee pain - -  Risk for fall due to : History of fall(s);Impaired balance/gait;Impaired mobility;Mental status change - - No Fall Risks -  Follow up Falls evaluation completed;Education provided;Falls prevention discussed - Falls evaluation completed Falls evaluation completed -  Comment - - - - -   Functional Status Survey:    Vitals:   03/28/21 1454  BP: (!) 144/79  Pulse: 74  Resp: 18  Temp: (!) 97.2 F (36.2 C)  SpO2: 97%  Weight: 152 lb (68.9 kg)  Height: 5\' 9"  (1.753 m)   Body mass index is 22.45 kg/m. Physical Exam Vitals and nursing note reviewed.  Constitutional:      Appearance: Normal appearance.  HENT:     Head: Normocephalic and atraumatic.     Nose: No congestion.     Comments: No facial pain when pressed.     Mouth/Throat:     Mouth: Mucous membranes are moist.  Eyes:     Extraocular Movements: Extraocular movements intact.     Conjunctiva/sclera: Conjunctivae normal.     Pupils: Pupils are equal, round, and reactive to light.  Cardiovascular:     Rate and Rhythm: Normal rate and regular rhythm.     Heart  sounds: No murmur heard. Pulmonary:     Effort: Pulmonary effort is normal.     Breath sounds: No rales.  Abdominal:     General: Bowel sounds are normal.     Palpations: Abdomen is soft.     Tenderness: There is no guarding.  Musculoskeletal:        General: Normal range of motion.     Cervical back: Normal range of motion and neck supple.     Right lower leg: No edema.     Left lower leg: No edema.  Skin:    General: Skin is warm and dry.     Comments: Left lower back boil, drained, mild redness  Neurological:     General: No focal deficit present.     Mental Status: He is alert and oriented to person, place, and time. Mental status is at baseline.     Motor:  No weakness.     Coordination: Coordination normal.     Gait: Gait abnormal.  Psychiatric:        Mood and Affect: Mood normal.        Behavior: Behavior normal.        Thought Content: Thought content normal.    Labs reviewed: Recent Labs    10/22/20 0021 10/22/20 1222 12/24/20 1224 12/29/20 0000  NA 140 138 139 142  K 3.6 3.6 3.4* 3.7  CL 110 107 108 107  CO2 24 24 25  28*  GLUCOSE 110* 121* 195*  --   BUN 31* 24* 34* 28*  CREATININE 1.73* 1.30* 1.32* 1.3  CALCIUM 9.7 10.0 10.2 10.3   Recent Labs    08/23/20 1154 10/20/20 1315 12/24/20 1224 12/29/20 0000  AST 22 17 34 20  ALT 19 14 24 16   ALKPHOS 36* 32* 31* 37  BILITOT 0.5 0.4 0.7  --   PROT 6.9 6.3* 7.0  --   ALBUMIN 4.0 3.7 3.4* 3.7   Recent Labs    08/23/20 1154 10/20/20 1315 12/24/20 1224 12/29/20 0000  WBC 5.6 6.3 5.3 8.3  NEUTROABS 3.1 3.9 3.9 5,810.00  HGB 13.0 12.5* 11.2* 12.5*  HCT 38.5* 39.1 35.4* 39*  MCV 87.6 92.7 90.5  --   PLT 264.0 265 234 378   Lab Results  Component Value Date   TSH 2.21 07/03/2016   Lab Results  Component Value Date   HGBA1C 5.6 03/24/2021   Lab Results  Component Value Date   CHOL 135 10/21/2020   HDL 31 (L) 10/21/2020   LDLCALC 75 10/21/2020   LDLDIRECT 96.0 08/23/2020   TRIG 145 10/21/2020   CHOLHDL 4.4 10/21/2020    Significant Diagnostic Results in last 30 days:  No results found.  Assessment/Plan Boil, back a boil in the left lower back, able to drainage with pressure, mild redness noted. Warm compress 20 min each, qid x5 days.   Chronic rhinitis Stable, on DuoNeb prn, Fluticasone  CKD (chronic kidney disease) Bun/creat 29/1.3 12/29/20  TIA (transient ischemic attack) takes Plavix,  f/u Neurology. 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 Small lacunar infarct.  History of gout takes Allopurinol, prn Colchicine,  venous US was negative for DVT. Uric acid 6.1 02/02/21  Shuffling  gait Gait abnormality, shuffling, neurodegenerative disease, w/c for mobility.   Diabetes mellitus without complication ([place]) Hgb P3X 7.1 10/21/20  Recurrent falls Recurrent falls since 2013  Coronary atherosclerosis Echo no intracardiac source of embolism  Mixed hyperlipidemia  on Fenofibric acid,  LDL 75 10/21/20  Benign prostatic hyperplasia with urinary obstruction Urinary frequency, takes Finasteride  Osteoarthritis, multiple sites R knee pain, takes Tylenol, Voltaren gel  Weight loss Weight loss #15Ibs in one month? Continue monitor weight, dietary f/u, may consider labs if persists.     Family/ staff Communication: plan of care reviewed with the patient and charge nurse.   Labs/tests ordered:  none   Time spend 35 minutes.

## 2021-03-28 NOTE — Assessment & Plan Note (Signed)
Urinary frequency, takes Finasteride

## 2021-03-30 ENCOUNTER — Encounter: Payer: Self-pay | Admitting: Nurse Practitioner

## 2021-03-30 DIAGNOSIS — R634 Abnormal weight loss: Secondary | ICD-10-CM | POA: Insufficient documentation

## 2021-03-30 NOTE — Assessment & Plan Note (Signed)
Weight loss #15Ibs in one month? Continue monitor weight, dietary f/u, may consider labs if persists.

## 2021-04-06 ENCOUNTER — Encounter: Payer: Self-pay | Admitting: Internal Medicine

## 2021-04-06 ENCOUNTER — Non-Acute Institutional Stay (SKILLED_NURSING_FACILITY): Payer: Federal, State, Local not specified - PPO | Admitting: Internal Medicine

## 2021-04-06 DIAGNOSIS — E119 Type 2 diabetes mellitus without complications: Secondary | ICD-10-CM

## 2021-04-06 DIAGNOSIS — N138 Other obstructive and reflux uropathy: Secondary | ICD-10-CM

## 2021-04-06 DIAGNOSIS — Z8739 Personal history of other diseases of the musculoskeletal system and connective tissue: Secondary | ICD-10-CM

## 2021-04-06 DIAGNOSIS — R2689 Other abnormalities of gait and mobility: Secondary | ICD-10-CM | POA: Diagnosis not present

## 2021-04-06 DIAGNOSIS — G459 Transient cerebral ischemic attack, unspecified: Secondary | ICD-10-CM | POA: Diagnosis not present

## 2021-04-06 DIAGNOSIS — N401 Enlarged prostate with lower urinary tract symptoms: Secondary | ICD-10-CM

## 2021-04-06 NOTE — Progress Notes (Signed)
Location:   [facility] Room Number: 11 Place of Service:  SNF [phone]) Provider:  Veleta Miners MD  [name], NP  Patient Care Team: [name], NP as PCP - General (Adult Health Nurse Practitioner) [name], MD as PCP - Cardiology (Cardiology)  Extended Emergency Contact Information Primary Emergency Contact: [place], [place] of [place] Phone: [phone] Mobile Phone: [phone] Relation: Daughter Secondary Emergency Contact: [name],[name] Address: [address]           [place], [place] [address] [name] of [place] Phone: [phone] Mobile Phone: [phone] Relation: Daughter  Code Status:  Full Code Goals of care: Advanced Directive information Advanced Directives 04/06/2021  Does Patient Have a Medical Advance Directive? Yes  Type of Advance Directive Living will;Healthcare Power of Attorney  Does patient want to make changes to medical advance directive? No - Patient declined  Copy of [place] in Chart? Yes - validated most recent copy scanned in chart (See row information)  Would patient like information on creating a medical advance directive? -     Chief Complaint  Patient presents with   Medical Management of Chronic Issues   Quality Metric Gaps    Shingrix, urine micro., Foot exam, flu shot     HPI:  Pt is a 85 y.o. male seen today for medical management of chronic diseases.    Patient has a history of CAD, gout, HLD, hypertension and diabetes Patient was admitted in the hospital from 5/5-5/9 for Possible CVA Cannot do MRI due to Auditory implant He initally got better but was readmitted in SNF for recurent falls No More Work up just therapy since then per Neurology   Continues to be stable here Mostly wheelchair dependent. Per neurology he has neurodegenerative disease and continue Therapy Has lost almost 15 lbs since my last visit Per dietary  his appetite is good and continue sot work with them Was taken off Metformin due to Weight loss  Past Medical History:  Diagnosis Date   Cancer ([facility])    skin   CKD (chronic kidney disease) 06/24/2013   Cochlear implant in place    Coronary artery disease    a. s/p CABG 2000; b. myoview 9/08: inf-septal scar with mild peri-infarct ischemia (low risk);  c.  Echo 8/13: mild focal basal septal hypertrophy, Gr 1 DD, mild LAE;  d. Lexiscan Myoview (06/20/2013): No reversible ischemia, inferior and septal infarct, inferior and septal hypokinesis, EF 60%;  e.  Echo (06/20/2011): EF 50-55%, normal wall motion, grade 1 diastolic dysfunction, mild LAE   Diverticular disease    Duodenitis    Elevated PSA    GERD (gastroesophageal reflux disease)    Gout    Humerus fracture    right   Hydronephrosis with ureteropelvic junction obstruction    Hypercalcemia 02/02/2012   a. 01/2012: 10.7.   Hyperlipidemia    Hypertension    Pancreatitis    a. ? Simvastatin related   Pneumonia age 47 or 7   "I think only once" (06/19/2013)   Syncope    a. 06/2013   TIA (transient ischemic attack)    a. 01/2012 - presentation as acute imbalance;  b. 01/2012 carotid U/S w/o signif stenosis;  c. 01/2012 Echo: EF 60-65%, Gr 1 DD.;  d.  Carotid US (06/20/2013): 1-39% bilateral ICA stenosis   Tubular adenoma polyp of rectum    Type II  diabetes mellitus ([facility])    "diet and exercise controlled at present" (06/19/2013)   Past Surgical History:  Procedure Laterality Date   APPENDECTOMY     CARDIAC CATHETERIZATION  2000   CHOLECYSTECTOMY     COCHLEAR IMPLANT Right 1990's   COLONOSCOPY W/ POLYPECTOMY     no polyp 2012   CORONARY ARTERY BYPASS GRAFT  2000   CABG X5   INGUINAL HERNIA REPAIR Right 09/15/2015   Procedure: OPEN REPAIR RIGHT INGUINAL HERNIA ;  Surgeon: Jackolyn Confer, MD;  [facility];  Service: General;  Laterality: Right;   INSERTION OF MESH Right 09/15/2015   Procedure: INSERTION OF MESH;  Surgeon: Jackolyn Confer, MD;  [facility];  Service: General;  Laterality: Right;   PARATHYROIDECTOMY  1978   POLYPECTOMY     REVERSE SHOULDER ARTHROPLASTY Right 12/30/2014   Procedure: Right shoulder ORIF, Biomet S3 Plate and ZImmer cable system ;  Surgeon: Netta Cedars, MD;  Location: [facility];  Service: Orthopedics;  Laterality: Right;   right ankle surgery  age 75   TONSILLECTOMY  37   trachestomy as child for throat closing      Allergies  Allergen Reactions   Simvastatin Other (See Comments)    Possible statin-induced pancreatitis    Allergies as of 04/06/2021       Reactions   Simvastatin Other (See Comments)   Possible statin-induced pancreatitis        Medication List        Accurate as of April 06, 2021  9:59 AM. If you have any questions, ask your nurse or doctor.          acetaminophen 500 MG tablet Commonly known as: TYLENOL Take 1,000 mg by mouth 2 (two) times daily as needed for mild pain.   allopurinol 100 MG tablet Commonly known as: ZYLOPRIM Take 100 mg by mouth daily.   clopidogrel 75 MG tablet Commonly known as: PLAVIX Take 75 mg by mouth daily.   colchicine 0.6 MG tablet Take 0.6 mg by mouth daily as needed (as directed for gout flares).   diclofenac Sodium 1 % Gel Commonly known as: VOLTAREN Apply topically 4 (four) times daily. Apply to rt knee TID   Fenofibric Acid 135 MG Cpdr Take 1 tablet by mouth in the morning.   finasteride 5 MG tablet Commonly known as: PROSCAR Take 5 mg by mouth every evening.   fluticasone 50 MCG/ACT nasal spray Commonly known as: FLONASE Place 2 sprays into both nostrils daily.   ipratropium-albuterol 0.5-2.5 (3) MG/3ML Soln Commonly known as: DUONEB Take 3 mLs by nebulization every 6 (six) hours as needed.   zinc oxide 20 % ointment Apply 1 application topically as needed for irritation.        Review of Systems  Constitutional: Negative.   HENT: Negative.    Respiratory: Negative.     Cardiovascular: Negative.   Gastrointestinal: Negative.   Genitourinary: Negative.   Musculoskeletal:  Positive for gait problem.  Skin: Negative.   Neurological:  Negative for dizziness.  Psychiatric/Behavioral: Negative.     Immunization History  Administered Date(s) Administered   Fluad Quad(high Dose 65+) 04/01/2019   Influenza Split 04/13/2011, 05/02/2012   Influenza Whole 05/22/2007, 04/12/2008, 05/02/2009, 04/14/2010   Influenza, High Dose Seasonal PF 04/17/2013, 03/16/2014, 03/05/2016, 03/18/2017   Influenza,inj,Quad PF,6+ Mos 03/02/2015, 03/18/2018   Influenza-Unspecified 03/18/2018   Moderna SARS-COV2 Booster Vaccination 03/08/2021   Moderna Sars-Covid-2 Vaccination 06/22/2019, 07/20/2019, 05/02/2020, 11/23/2020   PPD Test 01/02/2015  Pneumococcal Conjugate-13 12/29/2015   Pneumococcal Polysaccharide-23 04/19/2006   Tdap 09/04/2016   Zoster, Live 06/29/2015   Pertinent  Health Maintenance Due  Topic Date Due   URINE MICROALBUMIN  01/15/2020   FOOT EXAM  07/20/2020   INFLUENZA VACCINE  01/16/2021   OPHTHALMOLOGY EXAM  08/31/2021   HEMOGLOBIN A1C  09/22/2021   Fall Risk  01/09/2021 12/07/2020 10/13/2020 08/23/2020 07/21/2019  Falls in the past year? - 1 1 0 0  Number falls in past yr: 1 1 1  0 0  Comment - abrasions - - -  Injury with Fall? 1 0 1 0 -  Comment - - left arm and knee pain - -  Risk for fall due to : History of fall(s);Impaired balance/gait;Impaired mobility;Mental status change - - No Fall Risks -  Follow up Falls evaluation completed;Education provided;Falls prevention discussed - Falls evaluation completed Falls evaluation completed -  Comment - - - - -   Functional Status Survey:    Vitals:   04/06/21 0953  BP: 128/68  Pulse: 75  Resp: 20  Temp: 98.1 F (36.7 C)  SpO2: 98%  Weight: 151 lb (68.5 kg)  Height: 5\' 9"  (1.753 m)   Body mass index is 22.3 kg/m. Physical Exam Vitals reviewed.  Constitutional:      Appearance: Normal appearance.   HENT:     Head: Normocephalic.     Nose: Nose normal.     Mouth/Throat:     Mouth: Mucous membranes are moist.     Pharynx: Oropharynx is clear.  Eyes:     Pupils: Pupils are equal, round, and reactive to light.  Cardiovascular:     Rate and Rhythm: Normal rate and regular rhythm.     Pulses: Normal pulses.     Heart sounds: Normal heart sounds.  Pulmonary:     Effort: Pulmonary effort is normal.     Breath sounds: Normal breath sounds.  Abdominal:     General: Abdomen is flat. Bowel sounds are normal.     Palpations: Abdomen is soft.  Musculoskeletal:        General: No swelling.     Cervical back: Neck supple.  Skin:    General: Skin is warm.  Neurological:     General: No focal deficit present.     Mental Status: He is alert.  Psychiatric:        Mood and Affect: Mood normal.        Thought Content: Thought content normal.    Labs reviewed: Recent Labs    10/22/20 0021 10/22/20 1222 12/24/20 1224 12/29/20 0000  NA 140 138 139 142  K 3.6 3.6 3.4* 3.7  CL 110 107 108 107  CO2 24 24 25  28*  GLUCOSE 110* 121* 195*  --   BUN 31* 24* 34* 28*  CREATININE 1.73* 1.30* 1.32* 1.3  CALCIUM 9.7 10.0 10.2 10.3   Recent Labs    08/23/20 1154 10/20/20 1315 12/24/20 1224 12/29/20 0000  AST 22 17 34 20  ALT 19 14 24 16   ALKPHOS 36* 32* 31* 37  BILITOT 0.5 0.4 0.7  --   PROT 6.9 6.3* 7.0  --   ALBUMIN 4.0 3.7 3.4* 3.7   Recent Labs    08/23/20 1154 10/20/20 1315 12/24/20 1224 12/29/20 0000  WBC 5.6 6.3 5.3 8.3  NEUTROABS 3.1 3.9 3.9 5,810.00  HGB 13.0 12.5* 11.2* 12.5*  HCT 38.5* 39.1 35.4* 39*  MCV 87.6 92.7 90.5  --   PLT 264.0 265  234 378   Lab Results  Component Value Date   TSH 2.21 07/03/2016   Lab Results  Component Value Date   HGBA1C 5.6 03/24/2021   Lab Results  Component Value Date   CHOL 135 10/21/2020   HDL 31 (L) 10/21/2020   LDLCALC 75 10/21/2020   LDLDIRECT 96.0 08/23/2020   TRIG 145 10/21/2020   CHOLHDL 4.4 10/21/2020     Significant Diagnostic Results in last 30 days:  No results found.  Assessment/Plan Shuffling gait Per Neurology it is due to Neurodegenerative disorder Continue Therapy PRN  TIA (transient ischemic attack) On Plavix Allergic to statin On Fenofibrate  Diabetes mellitus without complication ([facility]) M6K was 5.6 Metformin Discontinued  History of gout Uric acid good level On Allopurinol Benign prostatic hyperplasia  On Proscar Weight loss Working with Dietary If continue sot loose weight will need Imaging ?    Family/ staff Communication:   Labs/tests ordered:

## 2021-04-12 ENCOUNTER — Encounter: Payer: Self-pay | Admitting: Diagnostic Neuroimaging

## 2021-04-12 ENCOUNTER — Ambulatory Visit (INDEPENDENT_AMBULATORY_CARE_PROVIDER_SITE_OTHER): Payer: Federal, State, Local not specified - PPO | Admitting: Diagnostic Neuroimaging

## 2021-04-12 VITALS — BP 139/81 | HR 70 | Ht 69.0 in | Wt 151.0 lb

## 2021-04-12 DIAGNOSIS — R269 Unspecified abnormalities of gait and mobility: Secondary | ICD-10-CM

## 2021-04-12 DIAGNOSIS — I2510 Atherosclerotic heart disease of native coronary artery without angina pectoris: Secondary | ICD-10-CM

## 2021-04-12 DIAGNOSIS — F03A0 Unspecified dementia, mild, without behavioral disturbance, psychotic disturbance, mood disturbance, and anxiety: Secondary | ICD-10-CM

## 2021-04-12 DIAGNOSIS — R413 Other amnesia: Secondary | ICD-10-CM | POA: Diagnosis not present

## 2021-04-12 NOTE — Progress Notes (Addendum)
[facility]  PATIENT: Juan [name] DOB: 10/12/1935  REFERRING CLINICIAN: [name], NP HISTORY FROM: patient  REASON FOR VISIT: follow up   HISTORICAL  CHIEF COMPLAINT:  Chief Complaint  Patient presents with   Gait Problem    Rm 7, 4 month FU  resides at [facility], dgtr- Arbie Cookey  "still having falls, dgtr ordered wheelchair, lives in nursing facility now"  MMSE 22    HISTORY OF PRESENT ILLNESS:   UPDATE (04/12/21, VRP): Since last visit, patient now living at friend's home [place].  He has good support there and is enjoying his transition. Symptoms are progressive.  Memory loss and gait have worsened.  Now mainly using a wheelchair.    PRIOR HPI (12/07/20): 85 year old male here for evaluation of TIA and gait difficulty.  History of hypertension, hyperlipidemia, coronary artery disease, CABG, diabetes and TIA.  Patient also has hearing impairment status post cochlear implant.  Patient presented to hospital on 10/20/2020 after developing slurred speech and left-sided weakness.  He was admitted to the hospital for TIA stroke work-up.  Symptoms resolved within 15 to 30 minutes.  Stroke work-up was completed.  He was not able to get MRI due to cochlear implant.  Patient has had some chronic gait difficulty going back to 2017.  At that time he fell down and broke his right arm.  Since that time he started to take very small short steps.  This was described as a "shuffling gait".  This is progressively worsened over time.  In the last 3 to 6 months he has been having increasing gait and balance difficulties.  He was having increasing falls.  Also was having some decline in cognitive abilities and ADLs.    REVIEW OF SYSTEMS: Full 14 system review of systems performed and negative with exception of: As per HPI.  ALLERGIES: Allergies  Allergen Reactions   Simvastatin Other (See Comments)    Possible statin-induced pancreatitis    HOME MEDICATIONS: Outpatient  Medications Prior to Visit  Medication Sig Dispense Refill   acetaminophen (TYLENOL) 500 MG tablet Take 1,000 mg by mouth 2 (two) times daily as needed for mild pain.     allopurinol (ZYLOPRIM) 100 MG tablet Take 100 mg by mouth daily.     Choline Fenofibrate (FENOFIBRIC ACID) 135 MG CPDR Take 1 tablet by mouth in the morning.     clopidogrel (PLAVIX) 75 MG tablet Take 75 mg by mouth daily.     colchicine 0.6 MG tablet Take 0.6 mg by mouth daily as needed (as directed for gout flares).     diclofenac Sodium (VOLTAREN) 1 % GEL Apply topically 4 (four) times daily. Apply to rt knee TID     finasteride (PROSCAR) 5 MG tablet Take 5 mg by mouth every evening.     fluticasone (FLONASE) 50 MCG/ACT nasal spray Place 2 sprays into both nostrils daily. 16 g 0   ipratropium-albuterol (DUONEB) 0.5-2.5 (3) MG/3ML SOLN Take 3 mLs by nebulization every 6 (six) hours as needed.     zinc oxide 20 % ointment Apply 1 application topically as needed for irritation.     No facility-administered medications prior to visit.    PAST MEDICAL HISTORY: Past Medical History:  Diagnosis Date   Cancer ([place])    skin   CKD (chronic kidney disease) 06/24/2013   Cochlear implant in place    Coronary artery disease    a. s/p CABG 2000; b. myoview 9/08: inf-septal scar with mild peri-infarct  ischemia (low risk);  c.  Echo 8/13: mild focal basal septal hypertrophy, Gr 1 DD, mild LAE;  d. Lexiscan Myoview (06/20/2013): No reversible ischemia, inferior and septal infarct, inferior and septal hypokinesis, EF 60%;  e.  Echo (06/20/2011): EF 50-55%, normal wall motion, grade 1 diastolic dysfunction, mild LAE   Diverticular disease    Duodenitis    Elevated PSA    GERD (gastroesophageal reflux disease)    Gout    Humerus fracture    right   Hydronephrosis with ureteropelvic junction obstruction    Hypercalcemia 02/02/2012   a. 01/2012: 10.7.   Hyperlipidemia    Hypertension    Pancreatitis    a. ? Simvastatin related    Pneumonia age 65 or 7   "I think only once" (06/19/2013)   Syncope    a. 06/2013   TIA (transient ischemic attack)    a. 01/2012 - presentation as acute imbalance;  b. 01/2012 carotid U/S w/o signif stenosis;  c. 01/2012 Echo: EF 60-65%, Gr 1 DD.;  d.  Carotid US (06/20/2013): 1-39% bilateral ICA stenosis   Tubular adenoma polyp of rectum    Type II diabetes mellitus ([place])    "diet and exercise controlled at present" (06/19/2013)    PAST SURGICAL HISTORY: Past Surgical History:  Procedure Laterality Date   APPENDECTOMY     CARDIAC CATHETERIZATION  2000   CHOLECYSTECTOMY     COCHLEAR IMPLANT Right 1990's   COLONOSCOPY W/ POLYPECTOMY     no polyp 2012   CORONARY ARTERY BYPASS GRAFT  2000   CABG X5   INGUINAL HERNIA REPAIR Right 09/15/2015   Procedure: OPEN REPAIR RIGHT INGUINAL HERNIA ;  Surgeon: Jackolyn Confer, MD;  Location: [facility];  Service: General;  Laterality: Right;   INSERTION OF MESH Right 09/15/2015   Procedure: INSERTION OF MESH;  Surgeon: Jackolyn Confer, MD;  Location: [facility];  Service: General;  Laterality: Right;   PARATHYROIDECTOMY  1978   POLYPECTOMY     REVERSE SHOULDER ARTHROPLASTY Right 12/30/2014   Procedure: Right shoulder ORIF, Biomet S3 Plate and ZImmer cable system ;  Surgeon: Netta Cedars, MD;  Location: [facility];  Service: Orthopedics;  Laterality: Right;   right ankle surgery  age 44   TONSILLECTOMY  32   trachestomy as child for throat closing      FAMILY HISTORY: Family History  Problem Relation Age of Onset   Diabetes Mother    Melanoma Father    Heart attack Father         in 30s    SOCIAL HISTORY: Social History   Socioeconomic History   Marital status: Widowed    Spouse name: Not on file   Number of children: 2   Years of education: college   Highest education level: Not on file  Occupational History   Occupation: retired    Fish farm manager: RETIRED  Tobacco Use   Smoking status: Former    Types: Cigars   Smokeless tobacco: Never   Tobacco  comments:    06/19/2013 "rare cigar", 12/07/20 quit  Substance and Sexual Activity   Alcohol use: Yes    Comment: 12/07/20 1-2 a week   Drug use: No   Sexual activity: Not Currently    Birth control/protection: Injection  Other Topics Concern   Not on file  Social History Narrative   12/07/20 lives at  [place] area   Does get regular exercise   No caffeine   Social Determinants of Health   Financial  Resource Strain: Low Risk    Difficulty of Paying Living Expenses: Not hard at all  Food Insecurity: No Food Insecurity   Worried About Charity fundraiser in the Last Year: Never true   Ran Out of Food in the Last Year: Never true  Transportation Needs: No Transportation Needs   Lack of Transportation (Medical): No   Lack of Transportation (Non-Medical): No  Physical Activity: Not on file  Stress: No Stress Concern Present   Feeling of Stress : Only a little  Social Connections: Unknown   Frequency of Communication with Friends and Family: Three times a week   Frequency of Social Gatherings with Friends and Family: Once a week   Attends Religious Services: Never   Marine scientist or Organizations: No   Attends Music therapist: Never   Marital Status: Patient refused  Human resources officer Violence: Not At Risk   Fear of Current or Ex-Partner: No   Emotionally Abused: No   Physically Abused: No   Sexually Abused: No     PHYSICAL EXAM  GENERAL EXAM/CONSTITUTIONAL: Vitals:  Vitals:   04/12/21 1316  BP: 139/81  Pulse: 70  Weight: 151 lb (68.5 kg)  Height: 5\' 9"  (1.753 m)   Body mass index is 22.3 kg/m. Wt Readings from Last 3 Encounters:  04/12/21 151 lb (68.5 kg)  04/06/21 151 lb (68.5 kg)  03/28/21 152 lb (68.9 kg)   Patient is in no distress; well developed, nourished and groomed; neck is supple  CARDIOVASCULAR: Examination of carotid arteries is normal; no carotid bruits Regular rate and rhythm, no murmurs Examination of peripheral  vascular system by observation and palpation is normal  EYES: Ophthalmoscopic exam of optic discs and posterior segments is normal; no papilledema or hemorrhages No results found.  MUSCULOSKELETAL: Gait, strength, tone, movements noted in Neurologic exam below  NEUROLOGIC: MENTAL STATUS:  MMSE - [place] Exam 04/12/2021 01/09/2021  Not completed: - Unable to complete  Orientation to time 5 -  Orientation to Place 4 -  Registration 3 -  Attention/ Calculation 2 -  Recall 2 -  Language- name 2 objects 2 -  Language- repeat 0 -  Language- follow 3 step command 2 -  Language- read & follow direction 1 -  Write a sentence 1 -  Copy design 0 -  Total score 22 -   awake, alert, oriented to person, place and time recent and remote memory intact normal attention and concentration language fluent, comprehension intact, naming intact fund of knowledge appropriate  CRANIAL NERVE:  2nd - no papilledema on fundoscopic exam 2nd, 3rd, 4th, 6th - pupils equal and reactive to light, visual fields full to confrontation, extraocular muscles intact, no nystagmus 5th - facial sensation symmetric 7th - facial strength symmetric 8th - hearing --> DECR 9th - palate elevates symmetrically, uvula midline 11th - shoulder shrug symmetric 12th - tongue protrusion midline  MOTOR:  normal bulk and tone, full strength in the BUE, BLE MILD BRADYKINESIA IN RUE AND BLE MILD APRAXIA  SENSORY:  normal and symmetric to light touch, temperature, vibration  COORDINATION:  finger-nose-finger, fine finger movements SLOW  REFLEXES:  deep tendon reflexes TRACE  and symmetric  GAIT/STATION:  SHORT STEPS; GAIT APRAXIA; VERY UNSTEADY     DIAGNOSTIC DATA (LABS, IMAGING, TESTING) - I reviewed patient records, labs, notes, testing and imaging myself where available.  Lab Results  Component Value Date   WBC 8.3 12/29/2020   HGB 12.5 (A) 12/29/2020  HCT 39 (A) 12/29/2020   MCV 90.5  12/24/2020   PLT 378 12/29/2020      Component Value Date/Time   NA 142 12/29/2020 0000   K 3.7 12/29/2020 0000   CL 107 12/29/2020 0000   CO2 28 (A) 12/29/2020 0000   GLUCOSE 195 (H) 12/24/2020 1224   GLUCOSE 127 (H) 05/16/2006 1101   BUN 28 (A) 12/29/2020 0000   CREATININE 1.3 12/29/2020 0000   CREATININE 1.32 (H) 12/24/2020 1224   CREATININE 1.38 (H) 01/19/2020 1030   CALCIUM 10.3 12/29/2020 0000   PROT 7.0 12/24/2020 1224   ALBUMIN 3.7 12/29/2020 0000   AST 20 12/29/2020 0000   ALT 16 12/29/2020 0000   ALKPHOS 37 12/29/2020 0000   BILITOT 0.7 12/24/2020 1224   GFRNONAA 54 (L) 12/24/2020 1224   GFRNONAA 48 (L) 01/19/2020 1030   GFRAA 55 (L) 01/19/2020 1030   Lab Results  Component Value Date   CHOL 135 10/21/2020   HDL 31 (L) 10/21/2020   LDLCALC 75 10/21/2020   LDLDIRECT 96.0 08/23/2020   TRIG 145 10/21/2020   CHOLHDL 4.4 10/21/2020   Lab Results  Component Value Date   HGBA1C 5.6 03/24/2021   No results found for: [id number] Lab Results  Component Value Date   TSH 2.21 07/03/2016    10/21/20 CTA head / neck 1. No intracranial arterial occlusion or high-grade stenosis. 2. 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  10/21/20 TTE - No intracardiac source of embolism detected on this transthoracic study. A transesophageal  echocardiogram is recommended to exclude cardiac source of embolism if clinically indicated.   12/24/20 CT head  - No acute intracranial abnormality. - Extensive sinus disease is much worse than on the comparison head CT and includes a new air-fluid level in the left maxillary sinus consistent with acute sinusitis. - Atrophy and chronic microvascular ischemic change.    ASSESSMENT AND PLAN  85 y.o. year old male here with hypertension, diabetes, hyperlipidemia, coronary artery disease, presenting with transient spell of speech abnormality and left-sided weakness, possibly TIA.  Also with underlying progressive gait and balance  difficulty and cognitive changes for several months to several years.   Dx:  1. Memory deficit   2. Gait difficulty   3. Mild dementia without behavioral disturbance, psychotic disturbance, mood disturbance, or anxiety, unspecified dementia type      PLAN:   GAIT DIFFICULTY / SHORT STEPS / SHUFFLING GAIT - LIKELY underlying mild dementia (vascular vs neurodegenerative) - safety / supervision issues reviewed - daily physical activity / exercise (at least 15-30 minutes); likely needs supervision - eat more plants / vegetables - increase social activities, brain stimulation, games, puzzles, hobbies, crafts, arts, music - aim for at least 7-8 hours sleep per night (or more) - avoid smoking and alcohol - caution with medications, finances; no driving  TRANSIENT CONFUSION / SLURRED SPEECH / LUE WEAKNESS (possible TIA) - continue plavix  daily (single anti-platelet) long term  Return for return to PCP.    Penni Bombard, MD 00/71/2197, 5:88 PM Certified in Neurology, Neurophysiology and Neuroimaging  [facility] [address] [place], [place] [address] [phone]

## 2021-05-02 ENCOUNTER — Encounter: Payer: Self-pay | Admitting: Nurse Practitioner

## 2021-05-02 ENCOUNTER — Non-Acute Institutional Stay (SKILLED_NURSING_FACILITY): Payer: Federal, State, Local not specified - PPO | Admitting: Nurse Practitioner

## 2021-05-02 DIAGNOSIS — M150 Primary generalized (osteo)arthritis: Secondary | ICD-10-CM

## 2021-05-02 DIAGNOSIS — M159 Polyosteoarthritis, unspecified: Secondary | ICD-10-CM

## 2021-05-02 DIAGNOSIS — E119 Type 2 diabetes mellitus without complications: Secondary | ICD-10-CM

## 2021-05-02 DIAGNOSIS — R413 Other amnesia: Secondary | ICD-10-CM

## 2021-05-02 DIAGNOSIS — Z8739 Personal history of other diseases of the musculoskeletal system and connective tissue: Secondary | ICD-10-CM

## 2021-05-02 DIAGNOSIS — N1831 Chronic kidney disease, stage 3a: Secondary | ICD-10-CM

## 2021-05-02 DIAGNOSIS — N138 Other obstructive and reflux uropathy: Secondary | ICD-10-CM

## 2021-05-02 DIAGNOSIS — I2510 Atherosclerotic heart disease of native coronary artery without angina pectoris: Secondary | ICD-10-CM

## 2021-05-02 DIAGNOSIS — R2689 Other abnormalities of gait and mobility: Secondary | ICD-10-CM

## 2021-05-02 DIAGNOSIS — H9191 Unspecified hearing loss, right ear: Secondary | ICD-10-CM

## 2021-05-02 DIAGNOSIS — G459 Transient cerebral ischemic attack, unspecified: Secondary | ICD-10-CM

## 2021-05-02 DIAGNOSIS — N401 Enlarged prostate with lower urinary tract symptoms: Secondary | ICD-10-CM

## 2021-05-02 DIAGNOSIS — J310 Chronic rhinitis: Secondary | ICD-10-CM

## 2021-05-02 DIAGNOSIS — I10 Essential (primary) hypertension: Secondary | ICD-10-CM

## 2021-05-02 DIAGNOSIS — R296 Repeated falls: Secondary | ICD-10-CM

## 2021-05-02 DIAGNOSIS — E782 Mixed hyperlipidemia: Secondary | ICD-10-CM

## 2021-05-02 NOTE — Progress Notes (Signed)
Location:   [facility] Room Number: 11A Place of Service:  SNF (31) Provider:  Jaymie Mckiddy X, NP  [name], NP  Patient Care Team: [name], NP as PCP - General (Adult Health Nurse Practitioner) [name], MD as PCP - Cardiology (Cardiology)  Extended Emergency Contact Information Primary Emergency Contact: [place], [place] Phone: [phone] Mobile Phone: [phone] Relation: Daughter Secondary Emergency Contact: [name],[name] Address: [address]           [place], [place] [address] [name] Phone: [phone] Mobile Phone: [phone] Relation: Daughter  Code Status:  Full Code Goals of care: Advanced Directive information Advanced Directives 05/02/2021  Does Patient Have a Medical Advance Directive? Yes  Type of Advance Directive Living will;Healthcare Power of Attorney  Does patient want to make changes to medical advance directive? No - Patient declined  Copy of [place] in Chart? Yes - validated most recent copy scanned in chart (See row information)  Would patient like information on creating a medical advance directive? -     Chief Complaint  Patient presents with   Medical Management of Chronic Issues    Routine visit    HPI:  Pt is a 85 y.o. male seen today for medical management of chronic diseases.    Sinusitis, on DuoNeb prn, Fluticasone             CKD Bun/creat 29/1.3 12/29/20             TIA/CVA,  takes Plavix,  f/u Neurology. 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 Small lacunar infarct.             Hx of Gout, takes Allopurinol, prn Colchicine,  venous US was negative for DVT. Uric acid 6.1 02/02/21             HTN, off meds             Gait abnormality, shuffling, neurodegenerative disease, w/c for mobility.              T2DM Hgb a1c 5.6 03/24/21, diet controlled.               Recurrent falls since 2013             CAD Echo no intracardiac source of embolism             Hyperlipidemia, on Fenofibric acid,  LDL 75 10/21/20             Memory issues f/u neurology, functioning well in SNF [facility]             HOH, cochlear implant R             Urinary frequency, takes Finasteride             OA, R knee pain, improved,  takes Tylenol prn, wants to dc Voltaren gel    Past Medical History:  Diagnosis Date   Cancer ([place])    skin   CKD (chronic kidney disease) 06/24/2013   Cochlear implant in place    Coronary artery disease    a. s/p CABG 2000; b. myoview 9/08: inf-septal scar with mild peri-infarct ischemia (low risk);  c.  Echo 8/13: mild focal basal septal hypertrophy, Gr 1 DD, mild LAE;  d. Lexiscan Myoview (06/20/2013): No reversible  ischemia, inferior and septal infarct, inferior and septal hypokinesis, EF 60%;  e.  Echo (06/20/2011): EF 50-55%, normal wall motion, grade 1 diastolic dysfunction, mild LAE   Diverticular disease    Duodenitis    Elevated PSA    GERD (gastroesophageal reflux disease)    Gout    Humerus fracture    right   Hydronephrosis with ureteropelvic junction obstruction    Hypercalcemia 02/02/2012   a. 01/2012: 10.7.   Hyperlipidemia    Hypertension    Pancreatitis    a. ? Simvastatin related   Pneumonia age 32 or 7   "I think only once" (06/19/2013)   Syncope    a. 06/2013   TIA (transient ischemic attack)    a. 01/2012 - presentation as acute imbalance;  b. 01/2012 carotid U/S w/o signif stenosis;  c. 01/2012 Echo: EF 60-65%, Gr 1 DD.;  d.  Carotid US (06/20/2013): 1-39% bilateral ICA stenosis   Tubular adenoma polyp of rectum    Type II diabetes mellitus ([facility])    "diet and exercise controlled at present" (06/19/2013)   Past Surgical History:  Procedure Laterality Date   APPENDECTOMY     CARDIAC CATHETERIZATION  2000   CHOLECYSTECTOMY     COCHLEAR IMPLANT Right 1990's   COLONOSCOPY W/ POLYPECTOMY     no polyp 2012   CORONARY  ARTERY BYPASS GRAFT  2000   CABG X5   INGUINAL HERNIA REPAIR Right 09/15/2015   Procedure: OPEN REPAIR RIGHT INGUINAL HERNIA ;  Surgeon: Jackolyn Confer, MD;  Location: [facility];  Service: General;  Laterality: Right;   INSERTION OF MESH Right 09/15/2015   Procedure: INSERTION OF MESH;  Surgeon: Jackolyn Confer, MD;  Location: [facility];  Service: General;  Laterality: Right;   PARATHYROIDECTOMY  1978   POLYPECTOMY     REVERSE SHOULDER ARTHROPLASTY Right 12/30/2014   Procedure: Right shoulder ORIF, Biomet S3 Plate and ZImmer cable system ;  Surgeon: Netta Cedars, MD;  Location: [facility];  Service: Orthopedics;  Laterality: Right;   right ankle surgery  age 89   TONSILLECTOMY  68   trachestomy as child for throat closing      Allergies  Allergen Reactions   Simvastatin Other (See Comments)    Possible statin-induced pancreatitis    Allergies as of 05/02/2021       Reactions   Simvastatin Other (See Comments)   Possible statin-induced pancreatitis        Medication List        Accurate as of May 02, 2021 11:59 PM. If you have any questions, ask your nurse or doctor.          acetaminophen 500 MG tablet Commonly known as: TYLENOL Take 1,000 mg by mouth 2 (two) times daily as needed for mild pain.   allopurinol 100 MG tablet Commonly known as: ZYLOPRIM Take 100 mg by mouth daily.   clopidogrel 75 MG tablet Commonly known as: PLAVIX Take 75 mg by mouth daily.   colchicine 0.6 MG tablet Take 0.6 mg by mouth daily as needed (as directed for gout flares).   diclofenac Sodium 1 % Gel Commonly known as: VOLTAREN Apply topically 4 (four) times daily. Apply to rt knee TID   Fenofibric Acid 135 MG Cpdr Take 1 tablet by mouth in the morning.   finasteride 5 MG tablet Commonly known as: PROSCAR Take 5 mg by mouth every evening.   fluticasone 50 MCG/ACT nasal spray Commonly known as: FLONASE Place 2 sprays into both nostrils  daily.   ipratropium-albuterol 0.5-2.5 (3)  MG/3ML Soln Commonly known as: DUONEB Take 3 mLs by nebulization every 6 (six) hours as needed.   zinc oxide 20 % ointment Apply 1 application topically as needed for irritation.        Review of Systems  Constitutional:  Negative for fatigue, fever and unexpected weight change.  HENT:  Positive for hearing loss and rhinorrhea. Negative for congestion and trouble swallowing.        R cochlear implant  Eyes:  Negative for visual disturbance.  Respiratory:  Negative for cough, shortness of breath and wheezing.        DOE  Cardiovascular:  Negative for leg swelling.  Gastrointestinal:  Negative for abdominal pain and constipation.  Genitourinary:  Negative for dysuria, frequency and urgency.       1-2x/night.   Musculoskeletal:  Positive for arthralgias, back pain and gait problem.       Lower back pain, R knee pain, better controlled.   Skin:  Negative for color change.  Neurological:  Negative for speech difficulty, weakness and headaches.       Memory lapses.   Psychiatric/Behavioral:  Negative for confusion and sleep disturbance. The patient is not nervous/anxious.    Immunization History  Administered Date(s) Administered   Fluad Quad(high Dose 65+) 04/01/2019   Influenza Split 04/13/2011, 05/02/2012   Influenza Whole 05/22/2007, 04/12/2008, 05/02/2009, 04/14/2010   Influenza, High Dose Seasonal PF 04/17/2013, 03/16/2014, 03/05/2016, 03/18/2017   Influenza,inj,Quad PF,6+ Mos 03/02/2015, 03/18/2018   Influenza-Unspecified 03/18/2018, 04/12/2021   Moderna SARS-COV2 Booster Vaccination 03/08/2021   Moderna Sars-Covid-2 Vaccination 06/22/2019, 07/20/2019, 05/02/2020, 11/23/2020   PPD Test 01/02/2015   Pneumococcal Conjugate-13 12/29/2015   Pneumococcal Polysaccharide-23 04/19/2006   Tdap 09/04/2016   Zoster, Live 06/29/2015   Pertinent  Health Maintenance Due  Topic Date Due   URINE MICROALBUMIN  01/15/2020   FOOT EXAM  07/20/2020   OPHTHALMOLOGY EXAM  08/31/2021    HEMOGLOBIN A1C  09/22/2021   INFLUENZA VACCINE  Completed   Fall Risk 10/25/2020 12/07/2020 12/24/2020 01/09/2021 04/12/2021  Falls in the past year? - 1 - - 1  Number of falls in past year - abrasions - - -  Was there an injury with Fall? - 0 - 1 0  Was there an injury with Fall? - - - - -  Fall Risk Category Calculator - 2 - - 2  Fall Risk Category - Moderate - - Moderate  Patient Fall Risk Level High fall risk - High fall risk High fall risk -  Patient at Risk for Falls Due to - - - History of fall(s);Impaired balance/gait;Impaired mobility;Mental status change -  Fall risk Follow up - - - Falls evaluation completed;Education provided;Falls prevention discussed -  Fall risk Follow up - - - - -   Functional Status Survey:    Vitals:   05/02/21 1058  BP: 119/64  Pulse: 81  Resp: 19  Temp: (!) 97.3 F (36.3 C)  SpO2: 97%  Weight: 151 lb (68.5 kg)  Height: 5\' 9"  (1.753 m)   Body mass index is 22.3 kg/m. Physical Exam Vitals and nursing note reviewed.  Constitutional:      Appearance: Normal appearance.  HENT:     Head: Normocephalic and atraumatic.     Nose: No congestion.     Mouth/Throat:     Mouth: Mucous membranes are moist.  Eyes:     Extraocular Movements: Extraocular movements intact.     Conjunctiva/sclera: Conjunctivae normal.  Pupils: Pupils are equal, round, and reactive to light.  Cardiovascular:     Rate and Rhythm: Normal rate and regular rhythm.     Heart sounds: No murmur heard. Pulmonary:     Effort: Pulmonary effort is normal.     Breath sounds: No rales.  Abdominal:     General: Bowel sounds are normal.     Palpations: Abdomen is soft.     Tenderness: There is no guarding.  Musculoskeletal:        General: No tenderness. Normal range of motion.     Cervical back: Normal range of motion and neck supple.     Right lower leg: No edema.     Left lower leg: No edema.  Skin:    General: Skin is warm and dry.  Neurological:     General: No focal  deficit present.     Mental Status: He is alert and oriented to person, place, and time. Mental status is at baseline.     Motor: No weakness.     Gait: Gait abnormal.  Psychiatric:        Mood and Affect: Mood normal.        Behavior: Behavior normal.        Thought Content: Thought content normal.    Labs reviewed: Recent Labs    10/22/20 0021 10/22/20 1222 12/24/20 1224 12/29/20 0000  NA 140 138 139 142  K 3.6 3.6 3.4* 3.7  CL 110 107 108 107  CO2 24 24 25  28*  GLUCOSE 110* 121* 195*  --   BUN 31* 24* 34* 28*  CREATININE 1.73* 1.30* 1.32* 1.3  CALCIUM 9.7 10.0 10.2 10.3   Recent Labs    08/23/20 1154 10/20/20 1315 12/24/20 1224 12/29/20 0000  AST 22 17 34 20  ALT 19 14 24 16   ALKPHOS 36* 32* 31* 37  BILITOT 0.5 0.4 0.7  --   PROT 6.9 6.3* 7.0  --   ALBUMIN 4.0 3.7 3.4* 3.7   Recent Labs    08/23/20 1154 10/20/20 1315 12/24/20 1224 12/29/20 0000  WBC 5.6 6.3 5.3 8.3  NEUTROABS 3.1 3.9 3.9 5,810.00  HGB 13.0 12.5* 11.2* 12.5*  HCT 38.5* 39.1 35.4* 39*  MCV 87.6 92.7 90.5  --   PLT 264.0 265 234 378   Lab Results  Component Value Date   TSH 2.21 07/03/2016   Lab Results  Component Value Date   HGBA1C 5.6 03/24/2021   Lab Results  Component Value Date   CHOL 135 10/21/2020   HDL 31 (L) 10/21/2020   LDLCALC 75 10/21/2020   LDLDIRECT 96.0 08/23/2020   TRIG 145 10/21/2020   CHOLHDL 4.4 10/21/2020    Significant Diagnostic Results in last 30 days:  No results found.  Assessment/Plan Osteoarthritis, multiple sites R knee pain, improved,  takes Tylenol prn,  dc Voltaren gel-patient's request.     Benign prostatic hyperplasia with urinary obstruction Urinary frequency, no urinary retention, takes Finasteride, saw [facility].   HOH (hard of hearing)  cochlear implant R  Memory deficit  f/u neurology, functioning well in SNF [facility]  Mixed hyperlipidemia on Fenofibric acid,  LDL 75 10/21/20  Coronary atherosclerosis Echo no intracardiac  source of embolism  Recurrent falls  Recurrent falls since 2013  Diabetes mellitus without complication (HCC) Hgb Y4M 5.6 03/24/21, diet controlled  Shuffling gait shuffling, neurodegenerative disease, w/c for mobility.   Essential hypertension, benign Controlled Bp w/o meds.   History of gout  takes Allopurinol, prn  Colchicine,  venous US was negative for DVT. Uric acid 6.1 02/02/21. Onset age 62  TIA (transient ischemic attack) akes Plavix,  f/u Neurology. 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 Small lacunar infarct.  CKD (chronic kidney disease)  Bun/creat 29/1.3 12/29/20  Chronic rhinitis on DuoNeb prn, Fluticasone    Family/ staff Communication: plan of care reviewed with the patient, the patient's daughter HPOA, and charge nurse.   Labs/tests ordered:  none  Time spend 25 minutes.

## 2021-05-02 NOTE — Assessment & Plan Note (Signed)
cochlear implant R

## 2021-05-02 NOTE — Assessment & Plan Note (Signed)
on DuoNeb prn, Fluticasone

## 2021-05-02 NOTE — Assessment & Plan Note (Signed)
takes Allopurinol, prn Colchicine,  venous US was negative for DVT. Uric acid 6.1 02/02/21. Onset age 85

## 2021-05-02 NOTE — Assessment & Plan Note (Signed)
on Fenofibric acid,  LDL 75 10/21/20

## 2021-05-02 NOTE — Assessment & Plan Note (Signed)
Hgb a1c 5.6 03/24/21, diet controlled

## 2021-05-02 NOTE — Assessment & Plan Note (Signed)
akes Plavix,  f/u Neurology. 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 Small lacunar infarct.

## 2021-05-02 NOTE — Assessment & Plan Note (Signed)
Urinary frequency, no urinary retention, takes Finasteride, saw [facility].

## 2021-05-02 NOTE — Assessment & Plan Note (Signed)
shuffling, neurodegenerative disease, w/c for mobility.

## 2021-05-02 NOTE — Assessment & Plan Note (Signed)
f/u neurology, functioning well in SNF [facility]

## 2021-05-02 NOTE — Assessment & Plan Note (Signed)
Echo no intracardiac source of embolism

## 2021-05-02 NOTE — Assessment & Plan Note (Signed)
Controlled Bp w/o meds.

## 2021-05-02 NOTE — Assessment & Plan Note (Signed)
Bun/creat 29/1.3 12/29/20

## 2021-05-02 NOTE — Assessment & Plan Note (Signed)
R knee pain, improved,  takes Tylenol prn,  dc Voltaren gel-patient's request.

## 2021-05-02 NOTE — Assessment & Plan Note (Signed)
Recurrent falls since 2013

## 2021-05-04 ENCOUNTER — Encounter: Payer: Self-pay | Admitting: Nurse Practitioner

## 2021-06-07 ENCOUNTER — Encounter: Payer: Self-pay | Admitting: Orthopedic Surgery

## 2021-06-07 ENCOUNTER — Non-Acute Institutional Stay (SKILLED_NURSING_FACILITY): Payer: Federal, State, Local not specified - PPO | Admitting: Orthopedic Surgery

## 2021-06-07 DIAGNOSIS — R634 Abnormal weight loss: Secondary | ICD-10-CM

## 2021-06-07 DIAGNOSIS — E119 Type 2 diabetes mellitus without complications: Secondary | ICD-10-CM

## 2021-06-07 DIAGNOSIS — G459 Transient cerebral ischemic attack, unspecified: Secondary | ICD-10-CM

## 2021-06-07 DIAGNOSIS — N401 Enlarged prostate with lower urinary tract symptoms: Secondary | ICD-10-CM

## 2021-06-07 DIAGNOSIS — Z8739 Personal history of other diseases of the musculoskeletal system and connective tissue: Secondary | ICD-10-CM | POA: Diagnosis not present

## 2021-06-07 DIAGNOSIS — I10 Essential (primary) hypertension: Secondary | ICD-10-CM

## 2021-06-07 DIAGNOSIS — R2689 Other abnormalities of gait and mobility: Secondary | ICD-10-CM | POA: Diagnosis not present

## 2021-06-07 DIAGNOSIS — N138 Other obstructive and reflux uropathy: Secondary | ICD-10-CM

## 2021-06-07 NOTE — Progress Notes (Signed)
Location:   [facility] Room Number: 11 Place of Service:  SNF [phone]) Provider:  Windell Moulding, NP  [name], NP  Patient Care Team: [name], NP as PCP - General (Adult Health Nurse Practitioner) [name], MD as PCP - Cardiology (Cardiology)  Extended Emergency Contact Information Primary Emergency Contact: [name],[name]          [employer], [place] Phone: [phone] Mobile Phone: [phone] Relation: Daughter Secondary Emergency Contact: [name],[name] Address: [address]           [place], [place] [address] [name] of [place] Phone: [phone] Mobile Phone: [phone] Relation: Daughter  Code Status:  FULL CODE Goals of care: Advanced Directive information Advanced Directives 06/07/2021  Does Patient Have a Medical Advance Directive? Yes  Type of Paramedic of [place];Living will  Does patient want to make changes to medical advance directive? No - Patient declined  Copy of [place] in Chart? Yes - validated most recent copy scanned in chart (See row information)  Would patient like information on creating a medical advance directive? -     Chief Complaint  Patient presents with   Medical Management of Chronic Issues    Routine follow up visit.   Health Maintenance    Zoster vaccine,urine microalbumin, foot exam, 3rd COVID booster    HPI:  Pt is a 85 y.o. male seen today for medical management of chronic diseases.    He currently resides on the skilled nursing unit at [facility]. PMH: CAD, HTN, TIA, T2DM, HOH, OA, BPH, CKD, memory deficit, shuffling gait, and weight loss.   Diabetes without complication- M0Q 5.6 67/61/9509, taken off metformin due to weight loss and stable A1c, remains on carb modified diet Shuffling gait- ambulating with wheelchair more- especially with long distances, uses walker for transfers/shirt distances, no recent  falls TIA- remains on plavix and fenofibrate, allergy to statins Gout- Uric acid 02/02/2021, no recent flares, remains on allopurinol BPH- denies retention, remains on finasteride Weight loss- improved, eating 75-100% most meals  Recent blood pressures:  12/14- 127/73  12/13- 146/74  12/06- 148/75  Recent weights:  12/19- 157.3 lbs  11/17- 151 lbs  10/13- 151 lbs  09/22- 158 lbs        Past Medical History:  Diagnosis Date   Cancer ([place])    skin   CKD (chronic kidney disease) 06/24/2013   Cochlear implant in place    Coronary artery disease    a. s/p CABG 2000; b. myoview 9/08: inf-septal scar with mild peri-infarct ischemia (low risk);  c.  Echo 8/13: mild focal basal septal hypertrophy, Gr 1 DD, mild LAE;  d. Lexiscan Myoview (06/20/2013): No reversible ischemia, inferior and septal infarct, inferior and septal hypokinesis, EF 60%;  e.  Echo (06/20/2011): EF 50-55%, normal wall motion, grade 1 diastolic dysfunction, mild LAE   Diverticular disease    Duodenitis    Elevated PSA    GERD (gastroesophageal reflux disease)    Gout    Humerus fracture    right   Hydronephrosis with ureteropelvic junction obstruction    Hypercalcemia 02/02/2012   a. 01/2012: 10.7.   Hyperlipidemia    Hypertension    Pancreatitis    a. ? Simvastatin related   Pneumonia age 74 or 7   "I think only once" (06/19/2013)   Syncope    a. 06/2013   TIA (transient ischemic attack)  a. 01/2012 - presentation as acute imbalance;  b. 01/2012 carotid U/S w/o signif stenosis;  c. 01/2012 Echo: EF 60-65%, Gr 1 DD.;  d.  Carotid US (06/20/2013): 1-39% bilateral ICA stenosis   Tubular adenoma polyp of rectum    Type II diabetes mellitus ([place])    "diet and exercise controlled at present" (06/19/2013)   Past Surgical History:  Procedure Laterality Date   APPENDECTOMY     CARDIAC CATHETERIZATION  2000   CHOLECYSTECTOMY     COCHLEAR IMPLANT Right 1990's   COLONOSCOPY W/ POLYPECTOMY     no polyp 2012   CORONARY  ARTERY BYPASS GRAFT  2000   CABG X5   INGUINAL HERNIA REPAIR Right 09/15/2015   Procedure: OPEN REPAIR RIGHT INGUINAL HERNIA ;  Surgeon: Jackolyn Confer, MD;  Location: [facility];  Service: General;  Laterality: Right;   INSERTION OF MESH Right 09/15/2015   Procedure: INSERTION OF MESH;  Surgeon: Jackolyn Confer, MD;  Location: [facility];  Service: General;  Laterality: Right;   PARATHYROIDECTOMY  1978   POLYPECTOMY     REVERSE SHOULDER ARTHROPLASTY Right 12/30/2014   Procedure: Right shoulder ORIF, Biomet S3 Plate and ZImmer cable system ;  Surgeon: Netta Cedars, MD;  Location: [facility];  Service: Orthopedics;  Laterality: Right;   right ankle surgery  age 34   TONSILLECTOMY  12   trachestomy as child for throat closing      Allergies  Allergen Reactions   Simvastatin Other (See Comments)    Possible statin-induced pancreatitis    Allergies as of 06/07/2021       Reactions   Simvastatin Other (See Comments)   Possible statin-induced pancreatitis        Medication List        Accurate as of June 07, 2021  9:20 AM. If you have any questions, ask your nurse or doctor.          STOP taking these medications    diclofenac Sodium 1 % Gel Commonly known as: VOLTAREN Stopped by: [name], NP       TAKE these medications    acetaminophen 500 MG tablet Commonly known as: TYLENOL Take 1,000 mg by mouth 2 (two) times daily as needed for mild pain.   allopurinol 100 MG tablet Commonly known as: ZYLOPRIM Take 100 mg by mouth daily.   clopidogrel 75 MG tablet Commonly known as: PLAVIX Take 75 mg by mouth daily.   colchicine 0.6 MG tablet Take 0.6 mg by mouth daily as needed (as directed for gout flares).   Fenofibric Acid 135 MG Cpdr Take 1 tablet by mouth in the morning.   finasteride 5 MG tablet Commonly known as: PROSCAR Take 5 mg by mouth every evening.   fluticasone 50 MCG/ACT nasal spray Commonly known as: FLONASE Place 2 sprays into both nostrils  daily.   ipratropium-albuterol 0.5-2.5 (3) MG/3ML Soln Commonly known as: DUONEB Take 3 mLs by nebulization every 6 (six) hours as needed.   zinc oxide 20 % ointment Apply 1 application topically as needed for irritation.        Review of Systems  Constitutional:  Negative for activity change, appetite change, chills, fatigue and fever.  HENT:  Positive for hearing loss. Negative for trouble swallowing.   Eyes:  Negative for visual disturbance.  Respiratory:  Negative for cough, shortness of breath and wheezing.   Cardiovascular:  Negative for chest pain and leg swelling.  Gastrointestinal:  Negative for abdominal distention, abdominal pain, constipation,  diarrhea and nausea.  Genitourinary:  Negative for dysuria, frequency and hematuria.  Musculoskeletal:  Positive for arthralgias and gait problem.  Skin:  Negative for rash and wound.  Neurological:  Positive for weakness. Negative for dizziness and headaches.  Psychiatric/Behavioral:  Positive for confusion. Negative for dysphoric mood. The patient is not nervous/anxious.    Immunization History  Administered Date(s) Administered   Fluad Quad(high Dose 65+) 04/01/2019   Influenza Split 04/13/2011, 05/02/2012   Influenza Whole 05/22/2007, 04/12/2008, 05/02/2009, 04/14/2010   Influenza, High Dose Seasonal PF 04/17/2013, 03/16/2014, 03/05/2016, 03/18/2017   Influenza,inj,Quad PF,6+ Mos 03/02/2015, 03/18/2018   Influenza-Unspecified 03/18/2018, 04/12/2021   Moderna SARS-COV2 Booster Vaccination 03/08/2021   Moderna Sars-Covid-2 Vaccination 06/22/2019, 07/20/2019, 05/02/2020, 11/23/2020   PPD Test 01/02/2015   Pneumococcal Conjugate-13 12/29/2015   Pneumococcal Polysaccharide-23 04/19/2006   Tdap 09/04/2016   Zoster, Live 06/29/2015   Pertinent  Health Maintenance Due  Topic Date Due   URINE MICROALBUMIN  01/15/2020   FOOT EXAM  07/20/2020   OPHTHALMOLOGY EXAM  08/31/2021   HEMOGLOBIN A1C  09/22/2021   INFLUENZA VACCINE   Completed   Fall Risk 10/25/2020 12/07/2020 12/24/2020 01/09/2021 04/12/2021  Falls in the past year? - 1 - - 1  Number of falls in past year - abrasions - - -  Was there an injury with Fall? - 0 - 1 0  Was there an injury with Fall? - - - - -  Fall Risk Category Calculator - 2 - - 2  Fall Risk Category - Moderate - - Moderate  Patient Fall Risk Level High fall risk - High fall risk High fall risk -  Patient at Risk for Falls Due to - - - History of fall(s);Impaired balance/gait;Impaired mobility;Mental status change -  Fall risk Follow up - - - Falls evaluation completed;Education provided;Falls prevention discussed -  Fall risk Follow up - - - - -   Functional Status Survey:    Vitals:   06/07/21 0912  BP: (!) 164/94  Resp: 19  Temp: (!) 97.5 F (36.4 C)  SpO2: 96%  Weight: 157 lb 4.8 oz (71.4 kg)  Height: 5\' 9"  (1.753 m)   Body mass index is 23.23 kg/m. Physical Exam Vitals reviewed.  Constitutional:      General: He is not in acute distress. HENT:     Head: Normocephalic.     Right Ear: There is no impacted cerumen.     Left Ear: There is no impacted cerumen.     Ears:     Comments: Right cochlear implant    Nose: Nose normal.     Mouth/Throat:     Mouth: Mucous membranes are moist.  Eyes:     General:        Right eye: No discharge.        Left eye: No discharge.  Neck:     Vascular: No carotid bruit.  Cardiovascular:     Rate and Rhythm: Normal rate and regular rhythm.     Pulses: Normal pulses.     Heart sounds: Normal heart sounds. No murmur heard. Pulmonary:     Effort: Pulmonary effort is normal. No respiratory distress.     Breath sounds: Normal breath sounds. No wheezing.  Abdominal:     General: Bowel sounds are normal. There is no distension.     Palpations: Abdomen is soft.     Tenderness: There is no abdominal tenderness.  Musculoskeletal:     Cervical back: Normal range of motion.  Right lower leg: No edema.     Left lower leg: No edema.   Lymphadenopathy:     Cervical: No cervical adenopathy.  Skin:    General: Skin is warm and dry.     Capillary Refill: Capillary refill takes less than 2 seconds.  Neurological:     General: No focal deficit present.     Mental Status: He is alert. Mental status is at baseline.     Motor: Weakness present.     Gait: Gait abnormal.     Comments: Wheelchair/walker  Psychiatric:        Mood and Affect: Mood normal.        Behavior: Behavior normal.     Comments: Very pleasant, follows commands, alert to self, place and person    Labs reviewed: Recent Labs    10/22/20 0021 10/22/20 1222 12/24/20 1224 12/29/20 0000  NA 140 138 139 142  K 3.6 3.6 3.4* 3.7  CL 110 107 108 107  CO2 24 24 25  28*  GLUCOSE 110* 121* 195*  --   BUN 31* 24* 34* 28*  CREATININE 1.73* 1.30* 1.32* 1.3  CALCIUM 9.7 10.0 10.2 10.3   Recent Labs    08/23/20 1154 10/20/20 1315 12/24/20 1224 12/29/20 0000  AST 22 17 34 20  ALT 19 14 24 16   ALKPHOS 36* 32* 31* 37  BILITOT 0.5 0.4 0.7  --   PROT 6.9 6.3* 7.0  --   ALBUMIN 4.0 3.7 3.4* 3.7   Recent Labs    08/23/20 1154 10/20/20 1315 12/24/20 1224 12/29/20 0000  WBC 5.6 6.3 5.3 8.3  NEUTROABS 3.1 3.9 3.9 5,810.00  HGB 13.0 12.5* 11.2* 12.5*  HCT 38.5* 39.1 35.4* 39*  MCV 87.6 92.7 90.5  --   PLT 264.0 265 234 378   Lab Results  Component Value Date   TSH 2.21 07/03/2016   Lab Results  Component Value Date   HGBA1C 5.6 03/24/2021   Lab Results  Component Value Date   CHOL 135 10/21/2020   HDL 31 (L) 10/21/2020   LDLCALC 75 10/21/2020   LDLDIRECT 96.0 08/23/2020   TRIG 145 10/21/2020   CHOLHDL 4.4 10/21/2020    Significant Diagnostic Results in last 30 days:  No results found.  Assessment/Plan 1. Diabetes mellitus without complication (HCC) - Z6W controlled - not on medication at this time - cont carb modified diet  2. Shuffling gait - ambulating more in wheelchair- especially long distances - cont skilled nursing  care  3. TIA (transient ischemic attack) - cont Plavix and fenofibrate - allergic to statins  4. History of gout - no recent flares - cont allopurinol  5. Benign prostatic hyperplasia with urinary obstruction - denies retention - cont finasteride  6. Essential hypertension, benign - controlled - not on medication  7. Weight loss - improved, eating 75-100% of meals - cont weights twice weekly    Family/ staff Communication: plan discussed with patient and nurse  Labs/tests ordered:  none

## 2021-06-08 ENCOUNTER — Encounter: Payer: Self-pay | Admitting: Orthopedic Surgery

## 2021-07-06 ENCOUNTER — Encounter: Payer: Self-pay | Admitting: Internal Medicine

## 2021-07-06 ENCOUNTER — Non-Acute Institutional Stay (SKILLED_NURSING_FACILITY): Payer: Federal, State, Local not specified - PPO | Admitting: Internal Medicine

## 2021-07-06 DIAGNOSIS — R2689 Other abnormalities of gait and mobility: Secondary | ICD-10-CM

## 2021-07-06 DIAGNOSIS — E782 Mixed hyperlipidemia: Secondary | ICD-10-CM

## 2021-07-06 DIAGNOSIS — Z8739 Personal history of other diseases of the musculoskeletal system and connective tissue: Secondary | ICD-10-CM | POA: Diagnosis not present

## 2021-07-06 DIAGNOSIS — G459 Transient cerebral ischemic attack, unspecified: Secondary | ICD-10-CM | POA: Diagnosis not present

## 2021-07-06 DIAGNOSIS — N138 Other obstructive and reflux uropathy: Secondary | ICD-10-CM

## 2021-07-06 DIAGNOSIS — E119 Type 2 diabetes mellitus without complications: Secondary | ICD-10-CM

## 2021-07-06 DIAGNOSIS — N401 Enlarged prostate with lower urinary tract symptoms: Secondary | ICD-10-CM

## 2021-07-06 DIAGNOSIS — I10 Essential (primary) hypertension: Secondary | ICD-10-CM

## 2021-07-06 NOTE — Progress Notes (Signed)
Location:  [facility] features editor of Service:  SNF (31)  Provider:   Code Status: Full Code Goals of Care:  Advanced Directives 06/07/2021  Does Patient Have a Medical Advance Directive? Yes  Type of Paramedic of [place];Living will  Does patient want to make changes to medical advance directive? No - Patient declined  Copy of [place] in Chart? Yes - validated most recent copy scanned in chart (See row information)  Would patient like information on creating a medical advance directive? -     Chief Complaint  Patient presents with   Medical Management of Chronic Issues    HPI: Patient is a 86 y.o. male seen today for medical management of chronic diseases.     Patient has a history of CAD, gout, HLD, hypertension and diabetes Patient was admitted in the hospital from 5/5-5/9 for Possible CVA Cannot do MRI due to Auditory implant He initally got better but was readmitted in SNF for recurent falls No More Work up just therapy since then per Neurology    Was diagnosed with Covid in 01/05. Did well with no new complains   No new Nursing issues. No Behavior issues His weight is stable Needs mild assist but independent in his ADLS and transfers No Falls Daughter in the room. No New issue Wt Readings from Last 3 Encounters:  07/06/21 160 lb (72.6 kg)  06/07/21 157 lb 4.8 oz (71.4 kg)  05/02/21 151 lb (68.5 kg)   Past Medical History:  Diagnosis Date   Cancer ([place])    skin   CKD (chronic kidney disease) 06/24/2013   Cochlear implant in place    Coronary artery disease    a. s/p CABG 2000; b. myoview 9/08: inf-septal scar with mild peri-infarct ischemia (low risk);  c.  Echo 8/13: mild focal basal septal hypertrophy, Gr 1 DD, mild LAE;  d. Lexiscan Myoview (06/20/2013): No reversible ischemia, inferior and septal infarct, inferior and septal hypokinesis, EF 60%;  e.  Echo (06/20/2011): EF 50-55%, normal wall motion, grade 1  diastolic dysfunction, mild LAE   Diverticular disease    Duodenitis    Elevated PSA    GERD (gastroesophageal reflux disease)    Gout    Humerus fracture    right   Hydronephrosis with ureteropelvic junction obstruction    Hypercalcemia 02/02/2012   a. 01/2012: 10.7.   Hyperlipidemia    Hypertension    Pancreatitis    a. ? Simvastatin related   Pneumonia age 68 or 7   "I think only once" (06/19/2013)   Syncope    a. 06/2013   TIA (transient ischemic attack)    a. 01/2012 - presentation as acute imbalance;  b. 01/2012 carotid U/S w/o signif stenosis;  c. 01/2012 Echo: EF 60-65%, Gr 1 DD.;  d.  Carotid US (06/20/2013): 1-39% bilateral ICA stenosis   Tubular adenoma polyp of rectum    Type II diabetes mellitus ([place])    "diet and exercise controlled at present" (06/19/2013)    Past Surgical History:  Procedure Laterality Date   APPENDECTOMY     CARDIAC CATHETERIZATION  2000   CHOLECYSTECTOMY     COCHLEAR IMPLANT Right 1990's   COLONOSCOPY W/ POLYPECTOMY     no polyp 2012   CORONARY ARTERY BYPASS GRAFT  2000   CABG X5   INGUINAL HERNIA REPAIR Right 09/15/2015   Procedure: OPEN REPAIR RIGHT INGUINAL HERNIA ;  Surgeon: Jackolyn Confer, MD;  Location: [facility];  Service: General;  Laterality: Right;   INSERTION OF MESH Right 09/15/2015   Procedure: INSERTION OF MESH;  Surgeon: Jackolyn Confer, MD;  Location: [facility];  Service: General;  Laterality: Right;   PARATHYROIDECTOMY  1978   POLYPECTOMY     REVERSE SHOULDER ARTHROPLASTY Right 12/30/2014   Procedure: Right shoulder ORIF, Biomet S3 Plate and ZImmer cable system ;  Surgeon: Netta Cedars, MD;  Location: [facility];  Service: Orthopedics;  Laterality: Right;   right ankle surgery  age 64   TONSILLECTOMY  32   trachestomy as child for throat closing      Allergies  Allergen Reactions   Simvastatin Other (See Comments)    Possible statin-induced pancreatitis    Outpatient Encounter Medications as of 07/06/2021  Medication Sig    acetaminophen (TYLENOL) 500 MG tablet Take 1,000 mg by mouth 2 (two) times daily as needed for mild pain.   allopurinol (ZYLOPRIM) 100 MG tablet Take 100 mg by mouth daily.   Choline Fenofibrate (FENOFIBRIC ACID) 135 MG CPDR Take 1 tablet by mouth in the morning.   clopidogrel (PLAVIX) 75 MG tablet Take 75 mg by mouth daily.   colchicine 0.6 MG tablet Take 0.6 mg by mouth daily as needed (as directed for gout flares).   finasteride (PROSCAR) 5 MG tablet Take 5 mg by mouth every evening.   fluticasone (FLONASE) 50 MCG/ACT nasal spray Place 2 sprays into both nostrils daily.   ipratropium-albuterol (DUONEB) 0.5-2.5 (3) MG/3ML SOLN Take 3 mLs by nebulization every 6 (six) hours as needed.   zinc oxide 20 % ointment Apply 1 application topically as needed for irritation.   No facility-administered encounter medications on file as of 07/06/2021.    Review of Systems:  Review of Systems  Constitutional:  Negative for activity change, appetite change and unexpected weight change.  HENT: Negative.    Respiratory:  Negative for cough and shortness of breath.   Cardiovascular:  Negative for leg swelling.  Gastrointestinal:  Negative for constipation.  Genitourinary:  Negative for frequency.  Musculoskeletal:  Positive for gait problem. Negative for arthralgias and myalgias.  Skin: Negative.  Negative for rash.  Neurological:  Negative for dizziness and weakness.  Psychiatric/Behavioral:  Negative for confusion and sleep disturbance.   All other systems reviewed and are negative.  Health Maintenance  Topic Date Due   Zoster Vaccines- Shingrix (1 of 2) Never done   URINE MICROALBUMIN  01/15/2020   FOOT EXAM  07/20/2020   COVID-19 Vaccine (5 - Booster for Moderna series) 05/03/2021   OPHTHALMOLOGY EXAM  08/31/2021   HEMOGLOBIN A1C  09/22/2021   TETANUS/TDAP  09/05/2026   Pneumonia Vaccine 48+ Years old  Completed   INFLUENZA VACCINE  Completed   HPV VACCINES  Aged Out    Physical  Exam: Vitals:   07/06/21 1216  BP: (!) 156/86  Pulse: 86  Resp: 18  Temp: (!) 97.5 F (36.4 C)  SpO2: 96%  Weight: 160 lb (72.6 kg)   Body mass index is 23.63 kg/m. Physical Exam Vitals reviewed.  Constitutional:      Appearance: Normal appearance.  HENT:     Head: Normocephalic.     Mouth/Throat:     Mouth: Mucous membranes are moist.     Pharynx: Oropharynx is clear.  Eyes:     Pupils: Pupils are equal, round, and reactive to light.  Cardiovascular:     Rate and Rhythm: Normal rate and regular rhythm.     Pulses: Normal pulses.  Heart sounds: No murmur heard. Pulmonary:     Effort: Pulmonary effort is normal. No respiratory distress.     Breath sounds: Normal breath sounds. No rales.  Abdominal:     General: Abdomen is flat. Bowel sounds are normal.     Palpations: Abdomen is soft.  Musculoskeletal:        General: Swelling present.     Cervical back: Neck supple.     Comments: Mild swelling bilateral  Skin:    General: Skin is warm.  Neurological:     Mental Status: He is alert and oriented to person, place, and time.  Psychiatric:        Mood and Affect: Mood normal.        Thought Content: Thought content normal.    Labs reviewed: Basic Metabolic Panel: Recent Labs    10/22/20 0021 10/22/20 1222 12/24/20 1224 12/29/20 0000  NA 140 138 139 142  K 3.6 3.6 3.4* 3.7  CL 110 107 108 107  CO2 24 24 25  28*  GLUCOSE 110* 121* 195*  --   BUN 31* 24* 34* 28*  CREATININE 1.73* 1.30* 1.32* 1.3  CALCIUM 9.7 10.0 10.2 10.3   Liver Function Tests: Recent Labs    08/23/20 1154 10/20/20 1315 12/24/20 1224 12/29/20 0000  AST 22 17 34 20  ALT 19 14 24 16   ALKPHOS 36* 32* 31* 37  BILITOT 0.5 0.4 0.7  --   PROT 6.9 6.3* 7.0  --   ALBUMIN 4.0 3.7 3.4* 3.7   No results for input(s): LIPASE, AMYLASE in the last 8760 hours. No results for input(s): AMMONIA in the last 8760 hours. CBC: Recent Labs    08/23/20 1154 10/20/20 1315 12/24/20 1224  12/29/20 0000  WBC 5.6 6.3 5.3 8.3  NEUTROABS 3.1 3.9 3.9 5,810.00  HGB 13.0 12.5* 11.2* 12.5*  HCT 38.5* 39.1 35.4* 39*  MCV 87.6 92.7 90.5  --   PLT 264.0 265 234 378   Lipid Panel: Recent Labs    08/23/20 1154 10/21/20 0259  CHOL 154 135  HDL 38.40* 31*  LDLCALC  --  75  TRIG 202.0* 145  CHOLHDL 4 4.4  LDLDIRECT 96.0  --    Lab Results  Component Value Date   HGBA1C 5.6 03/24/2021    Procedures since last visit: No results found.  Assessment/Plan 1. Diabetes mellitus without complication ([place]) Repeat A1C in next visit Doing well Off all meds  2. Shuffling gait Wheelchair dependent Continues to do well with mild Assist  3. TIA (transient ischemic attack) Continue Plavix and Fenofibrate  4. History of gout Allopurinol  5. Benign prostatic hyperplasia with urinary obstruction Continue Proscar  6. Essential hypertension, benign Stable No Meds  7. Mixed hyperlipidemia Allergic to statin    Labs/tests ordered:  * No order type specified * Next appt:  Visit date not found

## 2021-07-25 ENCOUNTER — Ambulatory Visit: Payer: Federal, State, Local not specified - PPO | Admitting: Cardiovascular Disease

## 2021-07-28 ENCOUNTER — Encounter: Payer: Self-pay | Admitting: Orthopedic Surgery

## 2021-07-28 ENCOUNTER — Non-Acute Institutional Stay (SKILLED_NURSING_FACILITY): Payer: Federal, State, Local not specified - PPO | Admitting: Orthopedic Surgery

## 2021-07-28 DIAGNOSIS — R2689 Other abnormalities of gait and mobility: Secondary | ICD-10-CM

## 2021-07-28 DIAGNOSIS — Z66 Do not resuscitate: Secondary | ICD-10-CM

## 2021-07-28 DIAGNOSIS — N401 Enlarged prostate with lower urinary tract symptoms: Secondary | ICD-10-CM

## 2021-07-28 DIAGNOSIS — G459 Transient cerebral ischemic attack, unspecified: Secondary | ICD-10-CM

## 2021-07-28 DIAGNOSIS — E119 Type 2 diabetes mellitus without complications: Secondary | ICD-10-CM | POA: Diagnosis not present

## 2021-07-28 DIAGNOSIS — R413 Other amnesia: Secondary | ICD-10-CM

## 2021-07-28 DIAGNOSIS — E782 Mixed hyperlipidemia: Secondary | ICD-10-CM

## 2021-07-28 DIAGNOSIS — Z8739 Personal history of other diseases of the musculoskeletal system and connective tissue: Secondary | ICD-10-CM

## 2021-07-28 DIAGNOSIS — R634 Abnormal weight loss: Secondary | ICD-10-CM

## 2021-07-28 DIAGNOSIS — N138 Other obstructive and reflux uropathy: Secondary | ICD-10-CM

## 2021-07-28 DIAGNOSIS — I10 Essential (primary) hypertension: Secondary | ICD-10-CM

## 2021-07-28 NOTE — Progress Notes (Signed)
Location:  [facility] Room Number: 12/30/2020 Place of Service:  SNF (31) Provider:  Windell Moulding, AGNP-C  [name], NP  Patient Care Team: [name], NP as PCP - General (Adult Health Nurse Practitioner) [name], MD as PCP - Cardiology (Cardiology)  Extended Emergency Contact Information Primary Emergency Contact: [place], [place] Phone: [phone] Mobile Phone: [phone] Relation: Daughter Secondary Emergency Contact: [name],[name] Address: [address]           [place], [place] [address] [name] of [place] Phone: [phone] Mobile Phone: [phone] Relation: Daughter  Code Status:  DNR Goals of care: Advanced Directive information Advanced Directives 06/07/2021  Does Patient Have a Medical Advance Directive? Yes  Type of Paramedic of [place];Living will  Does patient want to make changes to medical advance directive? No - Patient declined  Copy of [place] in Chart? Yes - validated most recent copy scanned in chart (See row information)  Would patient like information on creating a medical advance directive? -     Chief Complaint  Patient presents with   Medical Management of Chronic Issues    HPI:  Pt is a 86 y.o. male seen today for medical management of chronic diseases.    He currently resides on the skilled nursing unit at [facility]. PMH: CAD, HTN, TIA, T2DM, HOH, OA, BPH, CKD, memory deficit, shuffling gait, and weight loss.   Diabetes without complication- B6L 6.1 89/37/3428, taken off metformin due to weight loss and stable A1c, remains on carb modified diet, diabetic foot exam today, diabetic eye exam due 08/2021 Memory deficit- BIMS score 15 07/03/2021, CT head 12/2020 atrophy and chronic microvascular ischemic changes noted, very pleasant, no behavioral outbursts or mood changes Shuffling gait- working  with PT, ambulating with walker 135 ft x 2, continues to use wheelchair most of time, no recent falls HxTIA- remains on plavix and fenofibrate, allergy to statins HTN- BUN/creat 28/1.28 12/2020, off medication at this time, on NAS diet HLD- LDL 75 10/2020, allergic to statins Hx of gout- uric acid 6.1 02/02/2021, no recent flares, remains on allopurinol BPH- denies retention, remains on finasteride Weight loss- see weights below, up 6 lbs from last month, he reports eating more protein and vegetables  Recent blood pressures:  02/07- 141/88  01/31- 134/66  01/24- 130/72  Recent weights:  02/09- 160 lbs  01/05- 154 lbs  12/05- 152.6 lbs       Past Medical History:  Diagnosis Date   Cancer ([place])    skin   CKD (chronic kidney disease) 06/24/2013   Cochlear implant in place    Coronary artery disease    a. s/p CABG 2000; b. myoview 9/08: inf-septal scar with mild peri-infarct ischemia (low risk);  c.  Echo 8/13: mild focal basal septal hypertrophy, Gr 1 DD, mild LAE;  d. Lexiscan Myoview (06/20/2013): No reversible ischemia, inferior and septal infarct, inferior and septal hypokinesis, EF 60%;  e.  Echo (06/20/2011): EF 50-55%, normal wall motion, grade 1 diastolic dysfunction, mild LAE   Diverticular disease    Duodenitis    Elevated PSA    GERD (gastroesophageal reflux disease)    Gout    Humerus fracture    right   Hydronephrosis with ureteropelvic junction obstruction    Hypercalcemia 02/02/2012   a. 01/2012: 10.7.   Hyperlipidemia    Hypertension  Pancreatitis    a. ? Simvastatin related   Pneumonia age 44 or 7   "I think only once" (06/19/2013)   Syncope    a. 06/2013   TIA (transient ischemic attack)    a. 01/2012 - presentation as acute imbalance;  b. 01/2012 carotid U/S w/o signif stenosis;  c. 01/2012 Echo: EF 60-65%, Gr 1 DD.;  d.  Carotid US (06/20/2013): 1-39% bilateral ICA stenosis   Tubular adenoma polyp of rectum    Type II diabetes mellitus ([place])    "diet and  exercise controlled at present" (06/19/2013)   Past Surgical History:  Procedure Laterality Date   APPENDECTOMY     CARDIAC CATHETERIZATION  2000   CHOLECYSTECTOMY     COCHLEAR IMPLANT Right 1990's   COLONOSCOPY W/ POLYPECTOMY     no polyp 2012   CORONARY ARTERY BYPASS GRAFT  2000   CABG X5   INGUINAL HERNIA REPAIR Right 09/15/2015   Procedure: OPEN REPAIR RIGHT INGUINAL HERNIA ;  Surgeon: Jackolyn Confer, MD;  Location: [facility];  Service: General;  Laterality: Right;   INSERTION OF MESH Right 09/15/2015   Procedure: INSERTION OF MESH;  Surgeon: Jackolyn Confer, MD;  Location: [facility];  Service: General;  Laterality: Right;   PARATHYROIDECTOMY  1978   POLYPECTOMY     REVERSE SHOULDER ARTHROPLASTY Right 12/30/2014   Procedure: Right shoulder ORIF, Biomet S3 Plate and ZImmer cable system ;  Surgeon: Netta Cedars, MD;  Location: [facility];  Service: Orthopedics;  Laterality: Right;   right ankle surgery  age 57   TONSILLECTOMY  51   trachestomy as child for throat closing      Allergies  Allergen Reactions   Simvastatin Other (See Comments)    Possible statin-induced pancreatitis    Outpatient Encounter Medications as of 07/28/2021  Medication Sig   acetaminophen (TYLENOL) 500 MG tablet Take 1,000 mg by mouth 2 (two) times daily as needed for mild pain.   allopurinol (ZYLOPRIM) 100 MG tablet Take 100 mg by mouth daily.   Choline Fenofibrate (FENOFIBRIC ACID) 135 MG CPDR Take 1 tablet by mouth in the morning.   clopidogrel (PLAVIX) 75 MG tablet Take 75 mg by mouth daily.   colchicine 0.6 MG tablet Take 0.6 mg by mouth daily as needed (as directed for gout flares).   finasteride (PROSCAR) 5 MG tablet Take 5 mg by mouth every evening.   fluticasone (FLONASE) 50 MCG/ACT nasal spray Place 2 sprays into both nostrils daily.   ipratropium-albuterol (DUONEB) 0.5-2.5 (3) MG/3ML SOLN Take 3 mLs by nebulization every 6 (six) hours as needed.   zinc oxide 20 % ointment Apply 1 application topically as  needed for irritation.   No facility-administered encounter medications on file as of 07/28/2021.    Review of Systems  Constitutional:  Negative for activity change, appetite change, chills, fatigue and fever.  HENT:  Positive for hearing loss. Negative for trouble swallowing.   Eyes:  Negative for visual disturbance.  Respiratory:  Negative for cough, shortness of breath and wheezing.   Cardiovascular:  Negative for chest pain and leg swelling.  Gastrointestinal:  Negative for abdominal distention, abdominal pain, blood in stool, constipation, diarrhea, nausea and vomiting.  Genitourinary:  Negative for dysuria, frequency and hematuria.  Musculoskeletal:  Positive for gait problem.  Skin:  Negative for wound.  Neurological:  Negative for dizziness and headaches.  Psychiatric/Behavioral:  Negative for dysphoric mood and sleep disturbance. The patient is not nervous/anxious.    Immunization History  Administered  Date(s) Administered   Fluad Quad(high Dose 65+) 04/01/2019   Influenza Split 04/13/2011, 05/02/2012   Influenza Whole 05/22/2007, 04/12/2008, 05/02/2009, 04/14/2010   Influenza, High Dose Seasonal PF 04/17/2013, 03/16/2014, 03/05/2016, 03/18/2017   Influenza,inj,Quad PF,6+ Mos 03/02/2015, 03/18/2018   Influenza-Unspecified 03/18/2018, 04/12/2021   Moderna SARS-COV2 Booster Vaccination 03/08/2021   Moderna Sars-Covid-2 Vaccination 06/22/2019, 07/20/2019, 05/02/2020, 11/23/2020   PPD Test 01/02/2015   Pneumococcal Conjugate-13 12/29/2015   Pneumococcal Polysaccharide-23 04/19/2006   Tdap 09/04/2016   Zoster, Live 06/29/2015   Pertinent  Health Maintenance Due  Topic Date Due   URINE MICROALBUMIN  01/15/2020   FOOT EXAM  07/20/2020   OPHTHALMOLOGY EXAM  08/31/2021   HEMOGLOBIN A1C  09/22/2021   INFLUENZA VACCINE  Completed   Fall Risk 10/25/2020 12/07/2020 12/24/2020 01/09/2021 04/12/2021  Falls in the past year? - 1 - - 1  Number of falls in past year - abrasions - - -   Was there an injury with Fall? - 0 - 1 0  Was there an injury with Fall? - - - - -  Fall Risk Category Calculator - 2 - - 2  Fall Risk Category - Moderate - - Moderate  Patient Fall Risk Level High fall risk - High fall risk High fall risk -  Patient at Risk for Falls Due to - - - History of fall(s);Impaired balance/gait;Impaired mobility;Mental status change -  Fall risk Follow up - - - Falls evaluation completed;Education provided;Falls prevention discussed -  Fall risk Follow up - - - - -   Functional Status Survey:    Vitals:   07/28/21 1430  BP: (!) 141/88  Pulse: 76  Resp: 20  Temp: (!) 97.2 F (36.2 C)  SpO2: 96%  Weight: 160 lb (72.6 kg)  Height: 5\' 9"  (1.753 m)   Body mass index is 23.63 kg/m. Physical Exam Vitals reviewed.  Constitutional:      General: He is not in acute distress. HENT:     Head: Normocephalic.     Right Ear: There is no impacted cerumen.     Left Ear: There is no impacted cerumen.     Ears:     Comments: Right cochlear implant    Mouth/Throat:     Mouth: Mucous membranes are moist.  Eyes:     General:        Right eye: No discharge.        Left eye: No discharge.     Pupils: Pupils are equal, round, and reactive to light.  Neck:     Vascular: No carotid bruit.  Cardiovascular:     Rate and Rhythm: Normal rate and regular rhythm.     Pulses:          Dorsalis pedis pulses are 1+ on the right side and 1+ on the left side.       Posterior tibial pulses are 1+ on the right side and 1+ on the left side.     Heart sounds: Normal heart sounds. No murmur heard. Pulmonary:     Effort: Pulmonary effort is normal. No respiratory distress.     Breath sounds: Normal breath sounds. No wheezing.  Abdominal:     General: Bowel sounds are normal. There is no distension.     Palpations: Abdomen is soft.     Tenderness: There is no abdominal tenderness.  Musculoskeletal:     Cervical back: Neck supple.     Right lower leg: No edema.     Left  lower  leg: No edema.  Feet:     Right foot:     Protective Sensation: 10 sites tested.  10 sites sensed.     Skin integrity: Skin integrity normal.     Toenail Condition: Right toenails are normal.     Left foot:     Protective Sensation: 10 sites tested.  10 sites sensed.     Skin integrity: Skin integrity normal.     Toenail Condition: Left toenails are normal.  Lymphadenopathy:     Cervical: No cervical adenopathy.  Skin:    General: Skin is warm and dry.     Capillary Refill: Capillary refill takes less than 2 seconds.  Neurological:     General: No focal deficit present.     Mental Status: He is alert and oriented to person, place, and time.     Motor: Weakness present.     Gait: Gait abnormal.     Comments: Wheelchair/walker  Psychiatric:        Mood and Affect: Mood normal.        Behavior: Behavior normal.     Comments: Very pleasant, responds appropriately, follows commands    Labs reviewed: Recent Labs    10/22/20 0021 10/22/20 1222 12/24/20 1224 12/29/20 0000  NA 140 138 139 142  K 3.6 3.6 3.4* 3.7  CL 110 107 108 107  CO2 24 24 25  28*  GLUCOSE 110* 121* 195*  --   BUN 31* 24* 34* 28*  CREATININE 1.73* 1.30* 1.32* 1.3  CALCIUM 9.7 10.0 10.2 10.3   Recent Labs    08/23/20 1154 10/20/20 1315 12/24/20 1224 12/29/20 0000  AST 22 17 34 20  ALT 19 14 24 16   ALKPHOS 36* 32* 31* 37  BILITOT 0.5 0.4 0.7  --   PROT 6.9 6.3* 7.0  --   ALBUMIN 4.0 3.7 3.4* 3.7   Recent Labs    08/23/20 1154 10/20/20 1315 12/24/20 1224 12/29/20 0000  WBC 5.6 6.3 5.3 8.3  NEUTROABS 3.1 3.9 3.9 5,810.00  HGB 13.0 12.5* 11.2* 12.5*  HCT 38.5* 39.1 35.4* 39*  MCV 87.6 92.7 90.5  --   PLT 264.0 265 234 378   Lab Results  Component Value Date   TSH 2.21 07/03/2016   Lab Results  Component Value Date   HGBA1C 5.6 03/24/2021   Lab Results  Component Value Date   CHOL 135 10/21/2020   HDL 31 (L) 10/21/2020   LDLCALC 75 10/21/2020   LDLDIRECT 96.0 08/23/2020    TRIG 145 10/21/2020   CHOLHDL 4.4 10/21/2020    Significant Diagnostic Results in last 30 days:  No results found.  Assessment/Plan 1. Do not resuscitate  2. Diabetes mellitus without complication ([place]) - P5W 6.1 06/2021 - off metformin - foot exam normal - needs diabetic eye exam- due 08/2021 - cont carb mod diet  3. Memory deficit - BIMS 15 06/2021 - very pleasant, answers appropriately  4. Shuffling gait - ambulating 135 ft x 2 with walker - cont PT  5. TIA (transient ischemic attack) - cont plavix and fenofibrate  6. Essential hypertension, benign - controlled without medication - cmp - cbc/diff  7. Mixed hyperlipidemia - LDL at goal - allergic to statins   8. History of gout - no recent flares - cont allopurinol  9. Benign prostatic hyperplasia with urinary obstruction - cont finasteride   10. Weight loss - improved - BMI 23.63    Family/ staff Communication: plan discussed with patient and nurse  Labs/tests ordered:  cbc/diff, cmp

## 2021-07-31 LAB — BASIC METABOLIC PANEL
BUN: 35 — AB (ref 4–21)
CO2: 26 — AB (ref 13–22)
Chloride: 109 — AB (ref 99–108)
Creatinine: 1.4 — AB (ref 0.6–1.3)
Glucose: 102
Potassium: 4.3 (ref 3.4–5.3)
Sodium: 144 (ref 137–147)

## 2021-07-31 LAB — HEPATIC FUNCTION PANEL
ALT: 8 — AB (ref 10–40)
AST: 16 (ref 14–40)
Alkaline Phosphatase: 38 (ref 25–125)
Bilirubin, Total: 0.5

## 2021-07-31 LAB — CBC AND DIFFERENTIAL
HCT: 41 (ref 41–53)
Hemoglobin: 13.6 (ref 13.5–17.5)
Neutrophils Absolute: 2433
Platelets: 239 (ref 150–399)
WBC: 4.6

## 2021-07-31 LAB — CBC: RBC: 4.51 (ref 3.87–5.11)

## 2021-07-31 LAB — COMPREHENSIVE METABOLIC PANEL
Albumin: 4.1 (ref 3.5–5.0)
Calcium: 10.5 (ref 8.7–10.7)
Globulin: 2.9

## 2021-08-07 LAB — COMPREHENSIVE METABOLIC PANEL WITH GFR: Calcium: 10.7 (ref 8.7–10.7)

## 2021-08-08 ENCOUNTER — Encounter: Payer: Self-pay | Admitting: Nurse Practitioner

## 2021-08-08 ENCOUNTER — Non-Acute Institutional Stay (SKILLED_NURSING_FACILITY): Payer: Federal, State, Local not specified - PPO | Admitting: Nurse Practitioner

## 2021-08-08 DIAGNOSIS — G459 Transient cerebral ischemic attack, unspecified: Secondary | ICD-10-CM

## 2021-08-08 DIAGNOSIS — E119 Type 2 diabetes mellitus without complications: Secondary | ICD-10-CM

## 2021-08-08 DIAGNOSIS — I10 Essential (primary) hypertension: Secondary | ICD-10-CM

## 2021-08-08 DIAGNOSIS — J310 Chronic rhinitis: Secondary | ICD-10-CM | POA: Diagnosis not present

## 2021-08-08 DIAGNOSIS — Z8739 Personal history of other diseases of the musculoskeletal system and connective tissue: Secondary | ICD-10-CM

## 2021-08-08 DIAGNOSIS — R2689 Other abnormalities of gait and mobility: Secondary | ICD-10-CM

## 2021-08-08 DIAGNOSIS — I2510 Atherosclerotic heart disease of native coronary artery without angina pectoris: Secondary | ICD-10-CM

## 2021-08-08 DIAGNOSIS — R296 Repeated falls: Secondary | ICD-10-CM

## 2021-08-08 DIAGNOSIS — N401 Enlarged prostate with lower urinary tract symptoms: Secondary | ICD-10-CM

## 2021-08-08 DIAGNOSIS — E782 Mixed hyperlipidemia: Secondary | ICD-10-CM

## 2021-08-08 DIAGNOSIS — R413 Other amnesia: Secondary | ICD-10-CM

## 2021-08-08 DIAGNOSIS — N138 Other obstructive and reflux uropathy: Secondary | ICD-10-CM

## 2021-08-08 DIAGNOSIS — N1831 Chronic kidney disease, stage 3a: Secondary | ICD-10-CM

## 2021-08-08 NOTE — Assessment & Plan Note (Signed)
shuffling, neurodegenerative disease, w/c for mobility.

## 2021-08-08 NOTE — Progress Notes (Signed)
Location:   SNF [place] Room Number: 11 Place of Service:  SNF (31) Provider: [facility] Josseline Reddin NP  [name], NP  Patient Care Team: [name], NP as PCP - General (Adult Health Nurse Practitioner) [name], MD as PCP - Cardiology (Cardiology)  Extended Emergency Contact Information Primary Emergency Contact: [place], [place] of [place] Phone: [phone] Mobile Phone: [phone] Relation: Daughter Secondary Emergency Contact: [name],[name] Address: [address]           [place], [place] [address] [name] of [place] Phone: [phone] Mobile Phone: [phone] Relation: Daughter  Code Status:  DNR Goals of care: Advanced Directive information Advanced Directives 06/07/2021  Does Patient Have a Medical Advance Directive? Yes  Type of Paramedic of [place];Living will  Does patient want to make changes to medical advance directive? No - Patient declined  Copy of [place] in Chart? Yes - validated most recent copy scanned in chart (See row information)  Would patient like information on creating a medical advance directive? -     Chief Complaint  Patient presents with   Acute Visit    elevated calcium level    HPI:  Pt is a 86 y.o. male seen today for an acute visit for mild elevated Ca10.5 07/31/21, Ca 10.7 08/07/21, denied increased thirst/urination, abd pain, nausea, bone pain, muscle weakness, or fatigue. He is at his baseline mentation.  Chronic rhinitis, on DuoNeb prn, Fluticasone             CKD Bun/creat 35/1.39 07/31/21             TIA/CVA,  takes Plavix,  f/u Neurology. 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 Small lacunar infarct.             Hx of Gout, takes Allopurinol, prn Colchicine,  venous US was negative for DVT. Uric acid 6.1 02/02/21             HTN, off meds             Gait  abnormality, shuffling, neurodegenerative disease, w/c for mobility.              T2DM Hgb a1c 5.6 03/24/21, diet controlled.              Recurrent falls since 2013             CAD Echo no intracardiac source of embolism             Hyperlipidemia, on Fenofibric acid,  LDL 75 10/21/20             Memory issues f/u neurology, functioning well in SNF [facility]             HOH, cochlear implant R             Urinary frequency, takes Finasteride             OA, R knee pain, improved,  takes Tylenol prn  Past Medical History:  Diagnosis Date   Cancer ([place])    skin   CKD (chronic kidney disease) 06/24/2013   Cochlear implant in place    Coronary artery disease    a. s/p CABG 2000; b. myoview 9/08: inf-septal scar with mild peri-infarct ischemia (low risk);  c.  Echo 8/13: mild focal basal septal hypertrophy, Gr 1 DD,  mild LAE;  d. [place] Adam Myoview (06/20/2013): No reversible ischemia, inferior and septal infarct, inferior and septal hypokinesis, EF 60%;  e.  Echo (06/20/2011): EF 50-55%, normal wall motion, grade 1 diastolic dysfunction, mild LAE   Diverticular disease    Duodenitis    Elevated PSA    GERD (gastroesophageal reflux disease)    Gout    Humerus fracture    right   Hydronephrosis with ureteropelvic junction obstruction    Hypercalcemia 02/02/2012   a. 01/2012: 10.7.   Hyperlipidemia    Hypertension    Pancreatitis    a. ? Simvastatin related   Pneumonia age 80 or 7   "I think only once" (06/19/2013)   Syncope    a. 06/2013   TIA (transient ischemic attack)    a. 01/2012 - presentation as acute imbalance;  b. 01/2012 carotid U/S w/o signif stenosis;  c. 01/2012 Echo: EF 60-65%, Gr 1 DD.;  d.  Carotid US (06/20/2013): 1-39% bilateral ICA stenosis   Tubular adenoma polyp of rectum    Type II diabetes mellitus ([place])    "diet and exercise controlled at present" (06/19/2013)   Past Surgical History:  Procedure Laterality Date   APPENDECTOMY     CARDIAC CATHETERIZATION  2000   CHOLECYSTECTOMY      COCHLEAR IMPLANT Right 1990's   COLONOSCOPY W/ POLYPECTOMY     no polyp 2012   CORONARY ARTERY BYPASS GRAFT  2000   CABG X5   INGUINAL HERNIA REPAIR Right 09/15/2015   Procedure: OPEN REPAIR RIGHT INGUINAL HERNIA ;  Surgeon: Jackolyn Confer, MD;  Location: [facility];  Service: General;  Laterality: Right;   INSERTION OF MESH Right 09/15/2015   Procedure: INSERTION OF MESH;  Surgeon: Jackolyn Confer, MD;  Location: [facility];  Service: General;  Laterality: Right;   PARATHYROIDECTOMY  1978   POLYPECTOMY     REVERSE SHOULDER ARTHROPLASTY Right 12/30/2014   Procedure: Right shoulder ORIF, Biomet S3 Plate and ZImmer cable system ;  Surgeon: Netta Cedars, MD;  Location: [facility];  Service: Orthopedics;  Laterality: Right;   right ankle surgery  age 39   TONSILLECTOMY  69   trachestomy as child for throat closing      Allergies  Allergen Reactions   Simvastatin Other (See Comments)    Possible statin-induced pancreatitis    Allergies as of 08/08/2021       Reactions   Simvastatin Other (See Comments)   Possible statin-induced pancreatitis        Medication List        Accurate as of August 08, 2021  1:40 PM. If you have any questions, ask your nurse or doctor.          acetaminophen 500 MG tablet Commonly known as: TYLENOL Take 1,000 mg by mouth 2 (two) times daily as needed for mild pain.   allopurinol 100 MG tablet Commonly known as: ZYLOPRIM Take 100 mg by mouth daily.   clopidogrel 75 MG tablet Commonly known as: PLAVIX Take 75 mg by mouth daily.   colchicine 0.6 MG tablet Take 0.6 mg by mouth daily as needed (as directed for gout flares).   Fenofibric Acid 135 MG Cpdr Take 1 tablet by mouth in the morning.   finasteride 5 MG tablet Commonly known as: PROSCAR Take 5 mg by mouth every evening.   fluticasone 50 MCG/ACT nasal spray Commonly known as: FLONASE Place 2 sprays into both nostrils daily.   ipratropium-albuterol 0.5-2.5 (3) MG/3ML Soln Commonly  known as:  DUONEB Take 3 mLs by nebulization every 6 (six) hours as needed.   zinc oxide 20 % ointment Apply 1 application topically as needed for irritation.        Review of Systems  Constitutional:  Negative for fatigue, fever and unexpected weight change.  HENT:  Positive for hearing loss and rhinorrhea. Negative for congestion and trouble swallowing.        R cochlear implant  Eyes:  Negative for visual disturbance.  Respiratory:  Negative for cough, shortness of breath and wheezing.        DOE  Cardiovascular:  Negative for leg swelling.  Gastrointestinal:  Negative for abdominal pain, constipation, nausea and vomiting.  Genitourinary:  Negative for dysuria, frequency and urgency.       1-2x/night.   Musculoskeletal:  Positive for arthralgias, back pain and gait problem. Negative for myalgias.       Lower back pain, R knee pain, better controlled.   Skin:  Negative for color change.  Neurological:  Negative for speech difficulty, weakness and headaches.       Memory lapses.   Psychiatric/Behavioral:  Negative for confusion and sleep disturbance. The patient is not nervous/anxious.    Immunization History  Administered Date(s) Administered   Fluad Quad(high Dose 65+) 04/01/2019   Influenza Split 04/13/2011, 05/02/2012   Influenza Whole 05/22/2007, 04/12/2008, 05/02/2009, 04/14/2010   Influenza, High Dose Seasonal PF 04/17/2013, 03/16/2014, 03/05/2016, 03/18/2017   Influenza,inj,Quad PF,6+ Mos 03/02/2015, 03/18/2018   Influenza-Unspecified 03/18/2018, 04/12/2021   Moderna SARS-COV2 Booster Vaccination 03/08/2021   Moderna Sars-Covid-2 Vaccination 06/22/2019, 07/20/2019, 05/02/2020, 11/23/2020   PPD Test 01/02/2015   Pneumococcal Conjugate-13 12/29/2015   Pneumococcal Polysaccharide-23 04/19/2006   Tdap 09/04/2016   Zoster, Live 06/29/2015   Pertinent  Health Maintenance Due  Topic Date Due   URINE MICROALBUMIN  01/15/2020   OPHTHALMOLOGY EXAM  08/31/2021    HEMOGLOBIN A1C  09/22/2021   FOOT EXAM  07/28/2022   INFLUENZA VACCINE  Completed   Fall Risk 10/25/2020 12/07/2020 12/24/2020 01/09/2021 04/12/2021  Falls in the past year? - 1 - - 1  Number of falls in past year - abrasions - - -  Was there an injury with Fall? - 0 - 1 0  Was there an injury with Fall? - - - - -  Fall Risk Category Calculator - 2 - - 2  Fall Risk Category - Moderate - - Moderate  Patient Fall Risk Level High fall risk - High fall risk High fall risk -  Patient at Risk for Falls Due to - - - History of fall(s);Impaired balance/gait;Impaired mobility;Mental status change -  Fall risk Follow up - - - Falls evaluation completed;Education provided;Falls prevention discussed -  Fall risk Follow up - - - - -   Functional Status Survey:    Vitals:   08/08/21 1335  BP: 127/60  Pulse: 70  Resp: 20  Temp: (!) 97 F (36.1 C)  SpO2: 96%  Weight: 162 lb (73.5 kg)  Height: _0  (1.753 m)   Body mass index is 23.92 kg/m. Physical Exam Vitals and nursing note reviewed.  Constitutional:      Appearance: Normal appearance.  HENT:     Head: Normocephalic and atraumatic.     Nose: No congestion.     Mouth/Throat:     Mouth: Mucous membranes are moist.  Eyes:     Extraocular Movements: Extraocular movements intact.     Conjunctiva/sclera: Conjunctivae normal.     Pupils: Pupils are equal, round,  and reactive to light.  Cardiovascular:     Rate and Rhythm: Normal rate and regular rhythm.     Heart sounds: No murmur heard. Pulmonary:     Effort: Pulmonary effort is normal.     Breath sounds: No rales.  Abdominal:     General: Bowel sounds are normal.     Palpations: Abdomen is soft.     Tenderness: There is no guarding.  Musculoskeletal:        General: No tenderness. Normal range of motion.     Cervical back: Normal range of motion and neck supple.     Right lower leg: No edema.     Left lower leg: No edema.  Skin:    General: Skin is warm and dry.  Neurological:      General: No focal deficit present.     Mental Status: He is alert and oriented to person, place, and time. Mental status is at baseline.     Gait: Gait abnormal.  Psychiatric:        Mood and Affect: Mood normal.        Behavior: Behavior normal.        Thought Content: Thought content normal.    Labs reviewed: Recent Labs    10/22/20 0021 10/22/20 1222 12/24/20 1224 12/29/20 0000 07/31/21 0000 08/07/21 0000  NA 140 138 139 142 144  --   K 3.6 3.6 3.4* 3.7 4.3  --   CL 110 107 108 107 109*  --   CO2 _0 28* 26*  --   GLUCOSE 110* 121* 195*  --   --   --   BUN 31* 24* 34* 28* 35*  --   CREATININE 1.73* 1.30* 1.32* 1.3 1.4*  --   CALCIUM 9.7 10.0 10.2 10.3 10.5 10.7   Recent Labs    08/23/20 1154 10/20/20 1315 12/24/20 1224 12/29/20 0000 07/31/21 0000  AST 22 17 34 20 16  ALT _1 8*  ALKPHOS 36* 32* 31* 37 38  BILITOT 0.5 0.4 0.7  --   --   PROT 6.9 6.3* 7.0  --   --   ALBUMIN 4.0 3.7 3.4* 3.7 4.1   Recent Labs    08/23/20 1154 10/20/20 1315 12/24/20 1224 12/29/20 0000 07/31/21 0000  WBC 5.6 6.3 5.3 8.3 4.6  NEUTROABS 3.1 3.9 3.9 5,810.00 2,433.00  HGB 13.0 12.5* 11.2* 12.5* 13.6  HCT 38.5* 39.1 35.4* 39* 41  MCV 87.6 92.7 90.5  --   --   PLT 264.0 265 234 378 239   Lab Results  Component Value Date   TSH 2.21 07/03/2016   Lab Results  Component Value Date   HGBA1C 5.6 03/24/2021   Lab Results  Component Value Date   CHOL 135 10/21/2020   HDL 31 (L) 10/21/2020   LDLCALC 75 10/21/2020   LDLDIRECT 96.0 08/23/2020   TRIG 145 10/21/2020   CHOLHDL 4.4 10/21/2020    Significant Diagnostic Results in last 30 days:  No results found.  Assessment/Plan Hypercalcemia  mild elevated Ca10.5 07/31/21, Ca 10.7 08/07/21, denied increased thirst/urination, abd pain, nausea, bone pain, muscle weakness, or fatigue. He is at his baseline mentation.  Will obtain CMP/eGFR, uric acid, PTH, Vit D  Chronic rhinitis on DuoNeb prn,  Fluticasone  CKD (chronic kidney disease) Bun/creat 35/1.39 07/31/21  TIA (transient ischemic attack)  takes Plavix,  f/u Neurology. 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  Small lacunar infarct.  History of gout takes Allopurinol, prn Colchicine,  venous US was negative for DVT. Uric acid 6.1 02/02/21. Update uric acid level.   Essential hypertension, benign Controlled, off meds.   Shuffling gait shuffling, neurodegenerative disease, w/c for mobility.   Diabetes mellitus without complication (HCC) Hgb L9J 5.6 03/24/21, diet controlled.   Recurrent falls Since 2013, better since residing in SNF [facility]  Coronary atherosclerosis Echo no intracardiac source of embolism  Mixed hyperlipidemia on Fenofibric acid,  LDL 75 10/21/20  Memory deficit f/u neurology, functioning well in SNF [facility]  Benign prostatic hyperplasia with urinary obstruction Urinary frequency is chronic, takes Finasteride, f/u Urology.     Family/ staff Communication: plan of care reviewed with the patient and charge nurse.   Labs/tests ordered:  CMP/eGFR, uric acid, PTH, Vit D  Time spend 35 minutes.

## 2021-08-08 NOTE — Assessment & Plan Note (Signed)
f/u neurology, functioning well in SNF [facility]

## 2021-08-08 NOTE — Progress Notes (Signed)
Patient ID: Juan [name], male   DOB: 1936/05/24, 86 y.o.   MRN: [id number]     Juan [name] is a 86 y.o.  male with a hx of CAD, s/p CABG in 2000, HTN, HL, deafness s/p cochlear implant. Last myoview 2015: inf-septal scar with mild peri-infarct ischemia (low risk).  01/16/15 fell on stairs and had right displaced humeral fracture surgically repaired by Dr Veverly Fells Still with poor ROM  Wife died 79 years ago Has two daughters that look after him Moved to [facility] in May 2019  and this seems to be working well for him   Balance is poor and more shuffling gait  Daughter from [place] with him today   Calcium mildly elevated Primary ordered PTH and labs 08/08/21 Pending   [facility] home Daughter [name] with him today She is a Transport planner D in [place] Daughter [name] in [place] works at [employer]   ROS: Denies fever, malais, weight loss, blurry vision, decreased visual acuity, cough, sputum, SOB, hemoptysis, pleuritic pain, palpitaitons, heartburn, abdominal pain, melena, lower extremity edema, claudication, or rash.  All other systems reviewed and negative  General: Affect appropriate Healthy:  appears stated age 49: choclear implant  Neck supple with no adenopathy JVP normal no bruits no thyromegaly Lungs clear with no wheezing and good diaphragmatic motion Heart:  S1/S2 SEM  murmur, no rub, gallop or click PMI normal Abdomen: benighn, BS positve, no tenderness, no AAA no bruit.  No HSM or HJR Distal pulses intact with no bruits No edema Neuro non-focal Skin warm and dry Post humeral fracture    Current Outpatient Medications  Medication Sig Dispense Refill   acetaminophen (TYLENOL) 500 MG tablet Take 1,000 mg by mouth 2 (two) times daily as needed for mild pain.     allopurinol (ZYLOPRIM) 100 MG tablet Take 100 mg by mouth daily.     Choline Fenofibrate (FENOFIBRIC ACID) 135 MG CPDR Take 1 tablet by mouth in the morning.     clopidogrel (PLAVIX) 75 MG tablet Take 75 mg by mouth  daily.     colchicine 0.6 MG tablet Take 0.6 mg by mouth daily as needed (as directed for gout flares).     finasteride (PROSCAR) 5 MG tablet Take 5 mg by mouth every evening.     fluticasone (FLONASE) 50 MCG/ACT nasal spray Place 2 sprays into both nostrils daily. 16 g 0   ipratropium-albuterol (DUONEB) 0.5-2.5 (3) MG/3ML SOLN Take 3 mLs by nebulization every 6 (six) hours as needed.     zinc oxide 20 % ointment Apply 1 application topically as needed for irritation.     No current facility-administered medications for this visit.    Allergies  Simvastatin  Electrocardiogram:08/11/2021 NSR normal ECG   Assessment and Plan  CAD/CABG:  Distant 2000 no angina continue medical Rx Myovue 1/15 with old IMI no ischemia EF normal Given Age and lack of chest pain observe   Chol:  Given age LDL 87 acceptable on statin pravachol 20 mg daily   Prostate:  F/U PSA Dr Braulio Bosch continue Proscar  Neuro:  He shuffles quit a bit with gait will defer to primary if neuro w/u needed for Parkinson's   DM:  Discussed low carb diet.  Target hemoglobin A1c is 6.5 or less.  Continue current medications.  TIA:  distant lacunar On plavix per neurology   Advanced Directives:  discussed DNR in place   F/U in a year    Jenkins Rouge

## 2021-08-08 NOTE — Assessment & Plan Note (Signed)
Controlled, off meds.  

## 2021-08-08 NOTE — Assessment & Plan Note (Signed)
takes Plavix,  f/u Neurology. 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 Small lacunar infarct.

## 2021-08-08 NOTE — Assessment & Plan Note (Signed)
Bun/creat 35/1.39 07/31/21

## 2021-08-08 NOTE — Assessment & Plan Note (Signed)
on Fenofibric acid,  LDL 75 10/21/20

## 2021-08-08 NOTE — Assessment & Plan Note (Signed)
takes Allopurinol, prn Colchicine,  venous US was negative for DVT. Uric acid 6.1 02/02/21. Update uric acid level.

## 2021-08-08 NOTE — Assessment & Plan Note (Signed)
Echo no intracardiac source of embolism

## 2021-08-08 NOTE — Assessment & Plan Note (Signed)
on DuoNeb prn, Fluticasone

## 2021-08-08 NOTE — Assessment & Plan Note (Signed)
Hgb a1c 5.6 03/24/21, diet controlled.

## 2021-08-08 NOTE — Assessment & Plan Note (Addendum)
mild elevated Ca10.5 07/31/21, Ca 10.7 08/07/21, denied increased thirst/urination, abd pain, nausea, bone pain, muscle weakness, or fatigue. He is at his baseline mentation.  Will obtain CMP/eGFR, uric acid, PTH, Vit D

## 2021-08-08 NOTE — Assessment & Plan Note (Signed)
Urinary frequency is chronic, takes Finasteride, f/u Urology.

## 2021-08-08 NOTE — Assessment & Plan Note (Signed)
Since 2013, better since residing in SNF [facility]

## 2021-08-10 ENCOUNTER — Encounter: Payer: Self-pay | Admitting: Nurse Practitioner

## 2021-08-10 LAB — BASIC METABOLIC PANEL
BUN: 33 — AB (ref 4–21)
CO2: 29 — AB (ref 13–22)
Chloride: 108 (ref 99–108)
Creatinine: 1.2 (ref 0.6–1.3)
Glucose: 122
Potassium: 4.1 mEq/L (ref 3.5–5.1)
Sodium: 141 (ref 137–147)

## 2021-08-10 LAB — COMPREHENSIVE METABOLIC PANEL
Albumin: 4.4 (ref 3.5–5.0)
Calcium: 10.7 (ref 8.7–10.7)
Globulin: 2.4
eGFR: 58

## 2021-08-10 LAB — HEPATIC FUNCTION PANEL
ALT: 12 U/L (ref 10–40)
AST: 17 (ref 14–40)
Alkaline Phosphatase: 36 (ref 25–125)
Bilirubin, Total: 0.5

## 2021-08-11 ENCOUNTER — Other Ambulatory Visit: Payer: Self-pay

## 2021-08-11 ENCOUNTER — Ambulatory Visit: Payer: Federal, State, Local not specified - PPO | Admitting: Cardiovascular Disease

## 2021-08-11 ENCOUNTER — Encounter: Payer: Self-pay | Admitting: Cardiovascular Disease

## 2021-08-11 VITALS — BP 130/74 | HR 74 | Ht 69.0 in | Wt 162.0 lb

## 2021-08-11 DIAGNOSIS — E782 Mixed hyperlipidemia: Secondary | ICD-10-CM | POA: Diagnosis not present

## 2021-08-11 DIAGNOSIS — Z951 Presence of aortocoronary bypass graft: Secondary | ICD-10-CM | POA: Diagnosis not present

## 2021-08-11 LAB — BASIC METABOLIC PANEL
BUN: 33 — AB (ref 4–21)
CO2: 29 — AB (ref 13–22)
Chloride: 108 (ref 99–108)
Creatinine: 1.2 (ref 0.6–1.3)
Glucose: 122
Potassium: 4.1 mEq/L (ref 3.5–5.1)
Sodium: 141 (ref 137–147)

## 2021-08-11 LAB — HEPATIC FUNCTION PANEL
ALT: 12 U/L (ref 10–40)
AST: 17 (ref 14–40)
Alkaline Phosphatase: 36 (ref 25–125)
Bilirubin, Total: 0.5

## 2021-08-11 LAB — COMPREHENSIVE METABOLIC PANEL
Albumin: 4.4 (ref 3.5–5.0)
Calcium: 10.7 (ref 8.7–10.7)
Globulin: 2.4

## 2021-08-11 LAB — VITAMIN D 25 HYDROXY (VIT D DEFICIENCY, FRACTURES): Vit D, 25-Hydroxy: 16

## 2021-08-11 NOTE — Patient Instructions (Signed)
Medication Instructions:  Your physician recommends that you continue on your current medications as directed. Please refer to the Current Medication list given to you today.  *If you need a refill on your cardiac medications before your next appointment, please call your pharmacy*   Lab Work: NONE If you have labs (blood work) drawn today and your tests are completely normal, you will receive your results only by: [place] (if you have MyChart) OR A paper copy in the mail If you have any lab test that is abnormal or we need to change your treatment, we will call you to review the results.   Testing/Procedures: NONE   Follow-Up: At [facility], you and your health needs are our priority.  As part of our continuing mission to provide you with exceptional heart care, we have created designated Provider Care Teams.  These Care Teams include your primary Cardiologist (physician) and Advanced Practice Providers (APPs -  Physician Assistants and Nurse Practitioners) who all work together to provide you with the care you need, when you need it.  We recommend signing up for the patient portal called "MyChart".  Sign up information is provided on this After Visit Summary.  MyChart is used to connect with patients for Virtual Visits (Telemedicine).  Patients are able to view lab/test results, encounter notes, upcoming appointments, etc.  Non-urgent messages can be sent to your provider as well.   To learn more about what you can do with MyChart, go to [email or web address].    Your next appointment:   6 month(s)  The format for your next appointment:   In Person  Provider:   Jenkins Rouge, MD {

## 2021-08-25 ENCOUNTER — Non-Acute Institutional Stay (SKILLED_NURSING_FACILITY): Payer: Federal, State, Local not specified - PPO | Admitting: Orthopedic Surgery

## 2021-08-25 ENCOUNTER — Encounter: Payer: Self-pay | Admitting: Orthopedic Surgery

## 2021-08-25 DIAGNOSIS — I10 Essential (primary) hypertension: Secondary | ICD-10-CM

## 2021-08-25 DIAGNOSIS — N401 Enlarged prostate with lower urinary tract symptoms: Secondary | ICD-10-CM

## 2021-08-25 DIAGNOSIS — E119 Type 2 diabetes mellitus without complications: Secondary | ICD-10-CM

## 2021-08-25 DIAGNOSIS — Z8739 Personal history of other diseases of the musculoskeletal system and connective tissue: Secondary | ICD-10-CM

## 2021-08-25 DIAGNOSIS — N138 Other obstructive and reflux uropathy: Secondary | ICD-10-CM

## 2021-08-25 DIAGNOSIS — E782 Mixed hyperlipidemia: Secondary | ICD-10-CM

## 2021-08-25 DIAGNOSIS — G459 Transient cerebral ischemic attack, unspecified: Secondary | ICD-10-CM

## 2021-08-25 DIAGNOSIS — E559 Vitamin D deficiency, unspecified: Secondary | ICD-10-CM

## 2021-08-25 DIAGNOSIS — R634 Abnormal weight loss: Secondary | ICD-10-CM

## 2021-08-25 DIAGNOSIS — R2689 Other abnormalities of gait and mobility: Secondary | ICD-10-CM | POA: Diagnosis not present

## 2021-08-25 DIAGNOSIS — R413 Other amnesia: Secondary | ICD-10-CM | POA: Diagnosis not present

## 2021-08-25 NOTE — Progress Notes (Signed)
Location:  [place] Room Number: N11/A Place of Service:  SNF (31) Provider: [name], NP  Patient Care Team: [name], NP as PCP - General (Adult Health Nurse Practitioner) [name], MD as PCP - Cardiology (Cardiology)  Extended Emergency Contact Information Primary Emergency Contact: [name],[name]          [place], [place] Phone: [phone] Mobile Phone: [phone] Relation: Daughter Secondary Emergency Contact: [name],[name] Address: [address]           [place], [place] [address] [name] of [place] Phone: [phone] Mobile Phone: [phone] Relation: Daughter  Code Status:  DNR Goals of care: Advanced Directive information Advanced Directives 08/25/2021  Does Patient Have a Medical Advance Directive? Yes  Type of Paramedic of [place];Living will;Out of facility DNR (pink MOST or yellow form)  Does patient want to make changes to medical advance directive? No - Patient declined  Copy of [place] in Chart? Yes - validated most recent copy scanned in chart (See row information)  Would patient like information on creating a medical advance directive? -  Pre-existing out of facility DNR order (yellow form or pink MOST form) Yellow form placed in chart (order not valid for inpatient use)     Chief Complaint  Patient presents with   Medical Management of Chronic Issues    Routine visit. Discuss the need for Shingrix vaccine, urine microalbumin, and additional Covid booster, or post pone if patient refuses.     HPI:  Pt is a 86 y.o. male seen today for medical management of chronic diseases.    He currently resides on the skilled nursing unit at [facility]. PMH: CAD, HTN, TIA, T2DM, HOH, OA, BPH, CKD, memory deficit, shuffling gait, and weight loss.    Diabetes- A1c 6.1 06/22/2021, taken off metformin due to weight loss and stable A1c,  remains on carb modified diet, diabetic eye exam due 08/2021 Memory deficit- BIMS score 15 07/03/2021, CT head 12/2020 atrophy and chronic microvascular ischemic changes noted, very pleasant, no behavioral outbursts or mood changes Shuffling gait- done with PT, continues to use wheelchair most of time, no recent falls Hypercalcemia- Calcium 10.7 02/20, asymptomatic Vitamin D deficiency- Vitamin D level 16 02/20, remains on cholecalciferol HxTIA- remains on plavix and fenofibrate, allergy to statins HTN- BUN/creat 33/1.2 02/23, off medication at this time, on NAS diet HLD- LDL 75 10/2020, allergic to statins Hx of gout- uric acid 7.4 02/23, no recent flares, remains on allopurinol/colchicine prn BPH- denies retention, urinates 1-2x/night, remains on finasteride Weight loss- stable, see weights below  Recent blood pressures:  03/07- 126/78  02/28- 147/85  02/21- 130/72  Recent weights:   03/09- 155.2 lbs  02/01- 155 lbs  01/02- 154 lbs  Past Medical History:  Diagnosis Date   Cancer ([name])    skin   CKD (chronic kidney disease) 06/24/2013   Cochlear implant in place    Coronary artery disease    a. s/p CABG 2000; b. myoview 9/08: inf-septal scar with mild peri-infarct ischemia (low risk);  c.  Echo 8/13: mild focal basal septal hypertrophy, Gr 1 DD, mild LAE;  d. Lexiscan Myoview (06/20/2013): No reversible ischemia, inferior and septal infarct, inferior and septal hypokinesis, EF 60%;  e.  Echo (06/20/2011): EF 50-55%, normal wall motion, grade 1 diastolic dysfunction, mild LAE   Diverticular disease    Duodenitis    Elevated PSA  GERD (gastroesophageal reflux disease)    Gout    Humerus fracture    right   Hydronephrosis with ureteropelvic junction obstruction    Hypercalcemia 02/02/2012   a. 01/2012: 10.7.   Hyperlipidemia    Hypertension    Pancreatitis    a. ? Simvastatin related   Pneumonia age 81 or 7   "I think only once" (06/19/2013)   Syncope    a. 06/2013   TIA  (transient ischemic attack)    a. 01/2012 - presentation as acute imbalance;  b. 01/2012 carotid U/S w/o signif stenosis;  c. 01/2012 Echo: EF 60-65%, Gr 1 DD.;  d.  Carotid US (06/20/2013): 1-39% bilateral ICA stenosis   Tubular adenoma polyp of rectum    Type II diabetes mellitus ([place])    "diet and exercise controlled at present" (06/19/2013)   Past Surgical History:  Procedure Laterality Date   APPENDECTOMY     CARDIAC CATHETERIZATION  2000   CHOLECYSTECTOMY     COCHLEAR IMPLANT Right 1990's   COLONOSCOPY W/ POLYPECTOMY     no polyp 2012   CORONARY ARTERY BYPASS GRAFT  2000   CABG X5   INGUINAL HERNIA REPAIR Right 09/15/2015   Procedure: OPEN REPAIR RIGHT INGUINAL HERNIA ;  Surgeon: Jackolyn Confer, MD;  Location: [facility];  Service: General;  Laterality: Right;   INSERTION OF MESH Right 09/15/2015   Procedure: INSERTION OF MESH;  Surgeon: Jackolyn Confer, MD;  Location: [facility];  Service: General;  Laterality: Right;   PARATHYROIDECTOMY  1978   POLYPECTOMY     REVERSE SHOULDER ARTHROPLASTY Right 12/30/2014   Procedure: Right shoulder ORIF, Biomet S3 Plate and ZImmer cable system ;  Surgeon: Netta Cedars, MD;  Location: [facility];  Service: Orthopedics;  Laterality: Right;   right ankle surgery  age 63   TONSILLECTOMY  59   trachestomy as child for throat closing      Allergies  Allergen Reactions   Simvastatin Other (See Comments)    Possible statin-induced pancreatitis    Outpatient Encounter Medications as of 08/25/2021  Medication Sig   acetaminophen (TYLENOL) 500 MG tablet Take 1,000 mg by mouth 2 (two) times daily as needed for mild pain.   allopurinol (ZYLOPRIM) 100 MG tablet Take 100 mg by mouth daily.   Cholecalciferol 50 MCG (2000 UT) TABS Take 2,000 Units by mouth in the morning.   Choline Fenofibrate (FENOFIBRIC ACID) 135 MG CPDR Take 1 tablet by mouth in the morning.   clopidogrel (PLAVIX) 75 MG tablet Take 75 mg by mouth daily.   colchicine 0.6 MG tablet Take 0.6 mg by mouth  daily as needed (as directed for gout flares).   finasteride (PROSCAR) 5 MG tablet Take 5 mg by mouth every evening.   fluticasone (FLONASE) 50 MCG/ACT nasal spray Place 2 sprays into both nostrils daily.   ipratropium-albuterol (DUONEB) 0.5-2.5 (3) MG/3ML SOLN Take 3 mLs by nebulization every 6 (six) hours as needed.   zinc oxide 20 % ointment Apply 1 application topically as needed for irritation.   No facility-administered encounter medications on file as of 08/25/2021.    Review of Systems  Constitutional:  Negative for activity change, appetite change, chills, fatigue and fever.  HENT:  Positive for hearing loss. Negative for sore throat, tinnitus and trouble swallowing.   Eyes:  Negative for visual disturbance.  Respiratory:  Negative for cough, shortness of breath and wheezing.   Cardiovascular:  Negative for chest pain and leg swelling.  Gastrointestinal:  Negative for  abdominal distention, abdominal pain, constipation, diarrhea, nausea and vomiting.  Genitourinary:  Positive for frequency. Negative for dysuria and hematuria.  Musculoskeletal:  Positive for arthralgias and gait problem.  Skin:  Negative for wound.  Neurological:  Positive for weakness. Negative for dizziness and headaches.  Psychiatric/Behavioral:  Positive for confusion. Negative for dysphoric mood and sleep disturbance. The patient is not nervous/anxious.    Immunization History  Administered Date(s) Administered   Fluad Quad(high Dose 65+) 04/01/2019   Influenza Split 04/13/2011, 05/02/2012   Influenza Whole 05/22/2007, 04/12/2008, 05/02/2009, 04/14/2010   Influenza, High Dose Seasonal PF 04/17/2013, 03/16/2014, 03/05/2016, 03/18/2017   Influenza,inj,Quad PF,6+ Mos 03/02/2015, 03/18/2018   Influenza-Unspecified 03/18/2018, 04/12/2021   Moderna SARS-COV2 Booster Vaccination 03/08/2021   Moderna Sars-Covid-2 Vaccination 06/22/2019, 07/20/2019, 05/02/2020, 11/23/2020   PPD Test 01/02/2015   Pneumococcal  Conjugate-13 12/29/2015   Pneumococcal Polysaccharide-23 04/19/2006   Tdap 09/04/2016   Zoster, Live 06/29/2015   Pertinent  Health Maintenance Due  Topic Date Due   URINE MICROALBUMIN  01/15/2020   OPHTHALMOLOGY EXAM  08/31/2021   HEMOGLOBIN A1C  09/22/2021   FOOT EXAM  07/28/2022   INFLUENZA VACCINE  Completed   Fall Risk 10/25/2020 12/07/2020 12/24/2020 01/09/2021 04/12/2021  Falls in the past year? - 1 - - 1  Number of falls in past year - abrasions - - -  Was there an injury with Fall? - 0 - 1 0  Was there an injury with Fall? - - - - -  Fall Risk Category Calculator - 2 - - 2  Fall Risk Category - Moderate - - Moderate  Patient Fall Risk Level High fall risk - High fall risk High fall risk -  Patient at Risk for Falls Due to - - - History of fall(s);Impaired balance/gait;Impaired mobility;Mental status change -  Fall risk Follow up - - - Falls evaluation completed;Education provided;Falls prevention discussed -  Fall risk Follow up - - - - -   Functional Status Survey:    Vitals:   08/25/21 1019  BP: 126/78  Pulse: 73  Resp: 18  Temp: (!) 97.3 F (36.3 C)  SpO2: 97%  Weight: 155 lb 3.2 oz (70.4 kg)  Height: '5\' 9"'$  (1.753 m)   Body mass index is 22.92 kg/m. Physical Exam Vitals reviewed.  Constitutional:      General: He is not in acute distress. HENT:     Head: Normocephalic.     Right Ear: There is no impacted cerumen.     Left Ear: There is no impacted cerumen.     Ears:     Comments: Right cochlear implant    Nose: Nose normal.     Mouth/Throat:     Mouth: Mucous membranes are moist.  Eyes:     General:        Right eye: No discharge.        Left eye: No discharge.  Neck:     Vascular: No carotid bruit.  Cardiovascular:     Rate and Rhythm: Normal rate and regular rhythm.     Pulses:          Dorsalis pedis pulses are 1+ on the right side and 1+ on the left side.       Posterior tibial pulses are 1+ on the right side and 1+ on the left side.      Heart sounds: Normal heart sounds.  Pulmonary:     Effort: Pulmonary effort is normal.     Breath sounds: Normal breath  sounds.  Abdominal:     General: Bowel sounds are normal. There is no distension.     Palpations: Abdomen is soft.     Tenderness: There is no abdominal tenderness.  Musculoskeletal:     Cervical back: Neck supple.     Right lower leg: No edema.     Left lower leg: No edema.  Feet:     Right foot:     Protective Sensation: 10 sites tested.  10 sites sensed.     Skin integrity: Skin integrity normal.     Toenail Condition: Right toenails are abnormally thick. Fungal disease present.    Left foot:     Protective Sensation: 10 sites tested.  10 sites sensed.     Skin integrity: Skin integrity normal.     Toenail Condition: Left toenails are abnormally thick. Fungal disease present. Lymphadenopathy:     Cervical: No cervical adenopathy.  Skin:    General: Skin is warm and dry.     Capillary Refill: Capillary refill takes less than 2 seconds.  Neurological:     General: No focal deficit present.     Mental Status: He is alert and oriented to person, place, and time.     Motor: Weakness present.     Gait: Gait abnormal.     Comments: wheelchair  Psychiatric:        Mood and Affect: Mood normal.        Behavior: Behavior normal.    Labs reviewed: Recent Labs    10/22/20 0021 10/22/20 1222 12/24/20 1224 12/29/20 0000 07/31/21 0000 08/07/21 0000 08/10/21 0000  NA 140 138 139 142 144  --  141  K 3.6 3.6 3.4* 3.7 4.3  --  4.1  CL 110 107 108 107 109*  --  108  CO2 '24 24 25 '$ 28* 26*  --  29*  GLUCOSE 110* 121* 195*  --   --   --   --   BUN 31* 24* 34* 28* 35*  --  33*  CREATININE 1.73* 1.30* 1.32* 1.3 1.4*  --  1.2  CALCIUM 9.7 10.0 10.2 10.3 10.5 10.7 10.7   Recent Labs    10/20/20 1315 12/24/20 1224 12/29/20 0000 07/31/21 0000 08/10/21 0000  AST 17 34 '20 16 17  '$ ALT '14 24 16 '$ 8* 12  ALKPHOS 32* 31* 37 38 36  BILITOT 0.4 0.7  --   --   --   PROT  6.3* 7.0  --   --   --   ALBUMIN 3.7 3.4* 3.7 4.1 4.4   Recent Labs    10/20/20 1315 12/24/20 1224 12/29/20 0000 07/31/21 0000  WBC 6.3 5.3 8.3 4.6  NEUTROABS 3.9 3.9 5,810.00 2,433.00  HGB 12.5* 11.2* 12.5* 13.6  HCT 39.1 35.4* 39* 41  MCV 92.7 90.5  --   --   PLT 265 234 378 239   Lab Results  Component Value Date   TSH 2.21 07/03/2016   Lab Results  Component Value Date   HGBA1C 5.6 03/24/2021   Lab Results  Component Value Date   CHOL 135 10/21/2020   HDL 31 (L) 10/21/2020   LDLCALC 75 10/21/2020   LDLDIRECT 96.0 08/23/2020   TRIG 145 10/21/2020   CHOLHDL 4.4 10/21/2020    Significant Diagnostic Results in last 30 days:  No results found.  Assessment/Plan 1. Diabetes mellitus without complication (HCC) - L5Q stable - off medications - cont carb mod diet - need diabetic eye exam soon  2.  Memory deficit - doing well in SNF - no behavioral outbursts  3. Shuffling gait - ambulates with wheelchair - no recent falls  4. Hypercalcemia - slightly elevated  - asymptomatic - PTH not completed   5. Vitamin D deficiency - V12 16 02/20 - cont supplement - recommend spending time outside when weather is nice  6. TIA (transient ischemic attack) - cont Plavix and Fenofibrate  7. Essential hypertension, benign - controlled without medication  8. Mixed hyperlipidemia - statin intolerance - cont fenofibrate  9. History of gout - no recent flares - cont allopurinol/ colchicine prn  10. Benign prostatic hyperplasia with urinary obstruction - urinates 1-2x/night - cont finasteride  11. Weight loss - stable - cont monthly weights    Family/ staff Communication: plan discussed with patient and nurse  Labs/tests ordered:  none

## 2021-09-08 IMAGING — CT CT ANGIO HEAD-NECK (W OR W/O PERF)
2 of 11 series · 7 of 33 positions shown · IV contrast (OMNI 350)
Comparison: None.

CLINICAL DATA: Stroke/TIA, assess intracranial arteries

EXAM:
CT ANGIOGRAPHY HEAD AND NECK
TECHNIQUE: Multidetector CT imaging of the head and neck was performed using
the standard protocol during bolus administration of intravenous
contrast. Multiplanar CT image reconstructions and MIPs were
obtained to evaluate the vascular anatomy. Carotid stenosis
measurements (when applicable) are obtained utilizing NASCET
criteria, using the distal internal carotid diameter as the
denominator.
CONTRAST:  60mL OMNIPAQUE IOHEXOL 350 MG/ML SOLN

[Series 9: cta neck (person_name) · axial · 0.54mm/px · z∈[-223,-99]mm · 2 of 187 slices shown]
[im 63/187  soft-tissue]
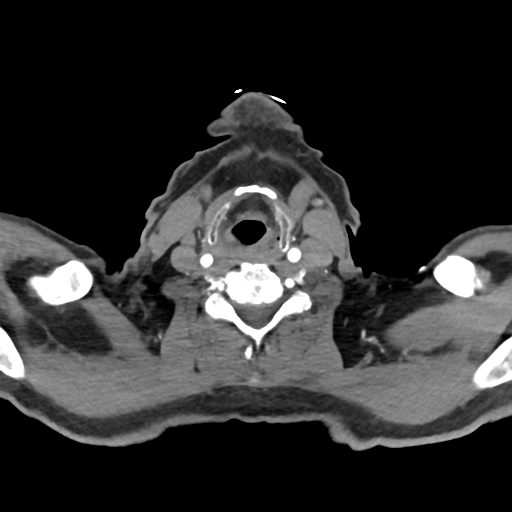
[im 125/187  soft-tissue]
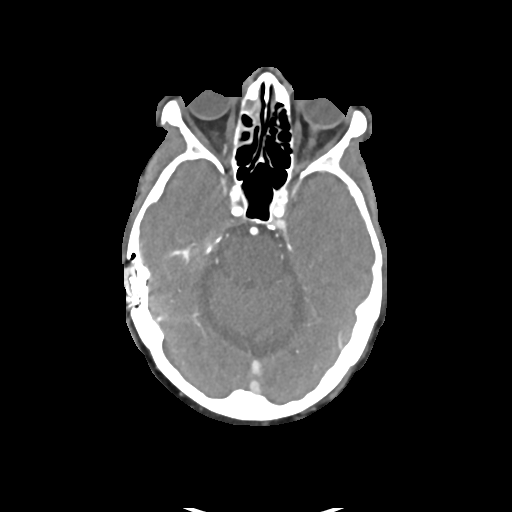

[Series 11: cta neck axial · axial · 0.39mm/px · z∈[-286,-42]mm · 5 of 368 slices shown]
[im 62/368  soft-tissue]
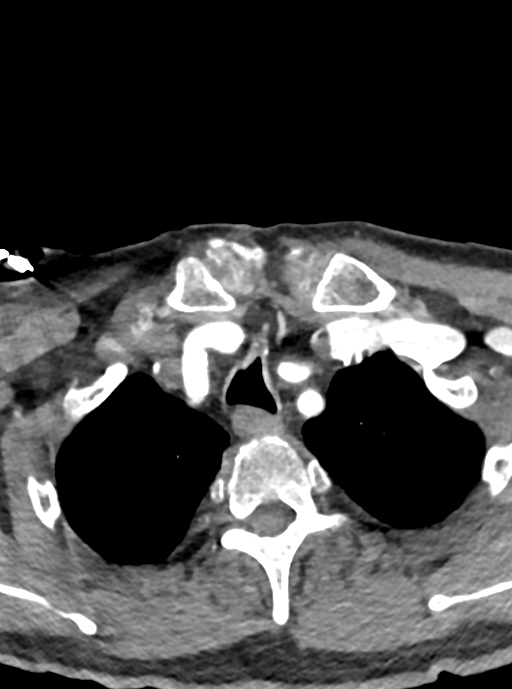
[im 123/368  bone]
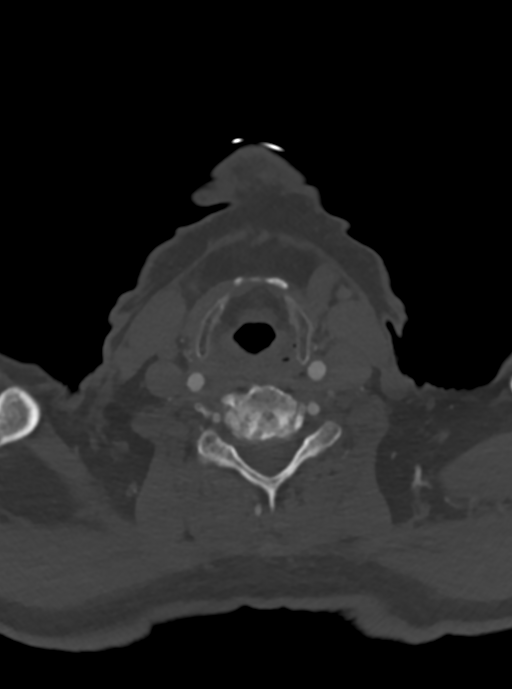
[im 184/368  soft-tissue]
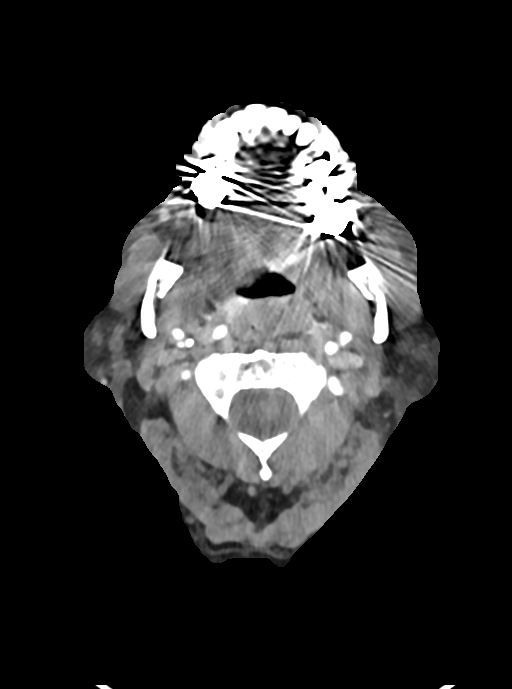
[im 245/368  bone]
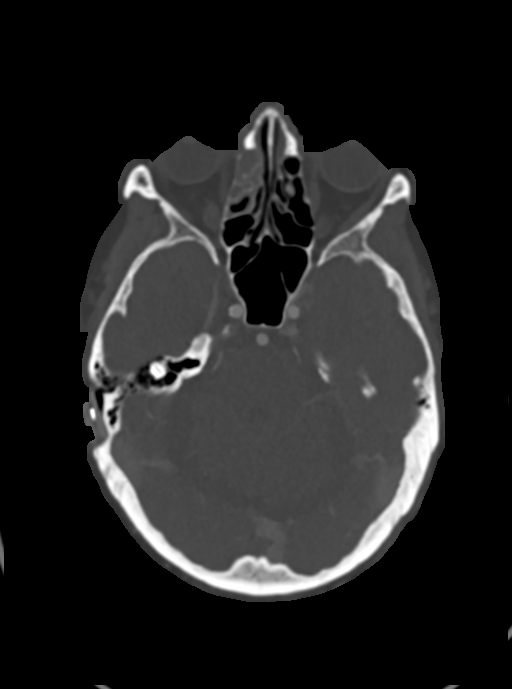
[im 306/368  soft-tissue]
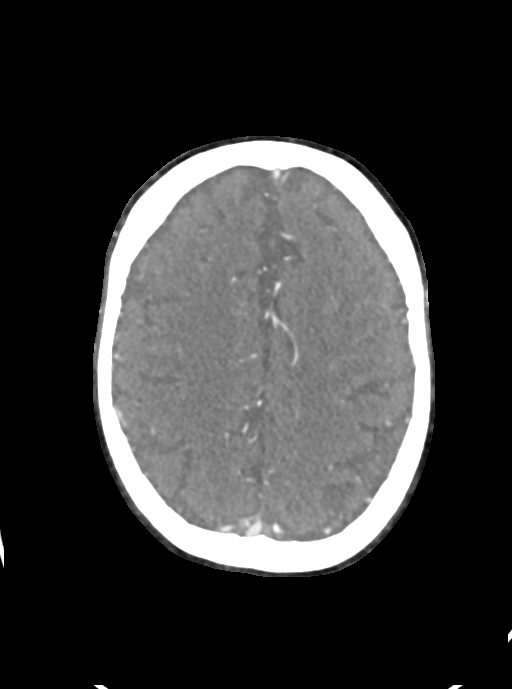

[7 of 33 positions shown; findings below may reference images not displayed]

FINDINGS: CT HEAD FINDINGS

Brain: There is no mass, hemorrhage or extra-axial collection. The
size and configuration of the ventricles and extra-axial CSF spaces
are normal. There is an old right basal ganglia lacunar infarct.
Bone anchored hearing aid causes extensive streak artifact. The
brain parenchyma is normal.

Skull: The visualized skull base, calvarium and extracranial soft
tissues are normal.

Sinuses/Orbits: No fluid levels or advanced mucosal thickening of
the visualized paranasal sinuses. No mastoid or middle ear effusion.
The orbits are normal.

CTA NECK FINDINGS

SKELETON: There is no bony spinal canal stenosis. No lytic or
blastic lesion.

OTHER NECK: Normal pharynx, larynx and major salivary glands. No
cervical lymphadenopathy. Unremarkable thyroid gland.

UPPER CHEST: No pneumothorax or pleural effusion. No nodules or
masses.

AORTIC ARCH:

There is calcific atherosclerosis of the aortic arch. There is no
aneurysm, dissection or hemodynamically significant stenosis of the
visualized portion of the aorta. Conventional 3 vessel aortic
branching pattern. The visualized proximal subclavian arteries are
widely patent.

RIGHT CAROTID SYSTEM: Normal without aneurysm, dissection or
stenosis.

LEFT CAROTID SYSTEM: Normal without aneurysm, dissection or
stenosis.

VERTEBRAL ARTERIES: Codominant configuration. Severe stenosis of the
right vertebral artery origin ([DATE], 162). There is no dissection,
occlusion or flow-limiting stenosis to the skull base (V1-V3
segments).

CTA HEAD FINDINGS

POSTERIOR CIRCULATION:

--Vertebral arteries: Normal V4 segments.

--Inferior cerebellar arteries: Normal.

--Basilar artery: Normal.

--Superior cerebellar arteries: Normal.

--Posterior cerebral arteries (PCA): Mild narrowing of the right P2
segment.

ANTERIOR CIRCULATION:

--Intracranial internal carotid arteries: Normal.

--Anterior cerebral arteries (ACA): Normal. Both A1 segments are
present. Patent anterior communicating artery (a-comm).

--Middle cerebral arteries (MCA): Normal.

VENOUS SINUSES: As permitted by contrast timing, patent.

ANATOMIC VARIANTS: None

Review of the MIP images confirms the above findings.
IMPRESSION: 1. No intracranial arterial occlusion or high-grade stenosis.
2. Severe stenosis of the right vertebral artery origin.

Aortic Atherosclerosis (NISYS-D1V.V).

## 2021-09-20 ENCOUNTER — Encounter: Payer: Self-pay | Admitting: Orthopedic Surgery

## 2021-09-20 ENCOUNTER — Non-Acute Institutional Stay (SKILLED_NURSING_FACILITY): Payer: Federal, State, Local not specified - PPO | Admitting: Orthopedic Surgery

## 2021-09-20 DIAGNOSIS — E119 Type 2 diabetes mellitus without complications: Secondary | ICD-10-CM | POA: Diagnosis not present

## 2021-09-20 DIAGNOSIS — R413 Other amnesia: Secondary | ICD-10-CM | POA: Diagnosis not present

## 2021-09-20 DIAGNOSIS — R2689 Other abnormalities of gait and mobility: Secondary | ICD-10-CM | POA: Diagnosis not present

## 2021-09-20 DIAGNOSIS — I10 Essential (primary) hypertension: Secondary | ICD-10-CM | POA: Diagnosis not present

## 2021-09-20 DIAGNOSIS — G459 Transient cerebral ischemic attack, unspecified: Secondary | ICD-10-CM

## 2021-09-20 DIAGNOSIS — E782 Mixed hyperlipidemia: Secondary | ICD-10-CM

## 2021-09-20 MED ORDER — HYDRALAZINE HCL 10 MG PO TABS: 10.0000 mg | ORAL_TABLET | Freq: Two times a day (BID) | ORAL | 0 refills | Status: DC | PRN

## 2021-09-20 MED ORDER — AMLODIPINE BESYLATE 2.5 MG PO TABS: 2.5000 mg | ORAL_TABLET | Freq: Every day | ORAL | 0 refills | Status: DC

## 2021-09-20 NOTE — Progress Notes (Signed)
?Location:  [facility] Room Number: N11/A ?Place of Service:  SNF (31) ?Provider: [name], NP ? ?Patient Care Team: ?[name], NP as PCP - General (Adult Health Nurse Practitioner) ?[name], MD as PCP - Cardiology (Cardiology) ? ?Extended Emergency Contact Information ?Primary Emergency Contact: [name],[name] ?         [place], [place] ?Home Phone: [phone] ?Mobile Phone: [phone] ?Relation: Daughter ?Secondary Emergency Contact: [name],[name] ?Address: [address]  ?         [place], [place] [address] [place] ?Home Phone: [phone] ?Mobile Phone: [phone] ?Relation: Daughter ? ?Code Status:  DNR ?Goals of care: Advanced Directive information ? ?  09/20/2021  ?  9:38 AM  ?Advanced Directives  ?Does Patient Have a Medical Advance Directive? Yes  ?Type of Paramedic of [place];Living will;Out of facility DNR (pink MOST or yellow form)  ?Does patient want to make changes to medical advance directive? No - Patient declined  ?Copy of [place] in Chart? Yes - validated most recent copy scanned in chart (See row information)  ?Pre-existing out of facility DNR order (yellow form or pink MOST form) Yellow form placed in chart (order not valid for inpatient use)  ? ? ? ?Chief Complaint  ?Patient presents with  ? Acute Visit  ?  Elevated blood pressure  ? ? ?HPI:  ?Pt is a 86 y.o. male seen today for an acute visit for elevated blood pressure.  ? ?He currently resides on the skilled nursing unit at [facility]. PMH: CAD, HTN, TIA, T2DM, HOH, OA, BPH, CKD, memory deficit, shuffling gait, and weight loss.  ? ?Nursing reports elevated blood pressures within the past month. He denies chest pain, sob, blurred vision or headaches. BUN/creat 33/1.2 08/10/2021.  09/2020 amlodipine and metoprolol discontinued due to low blood pressures. See recent blood pressure readings below.  ? ?Recent blood  pressures: ? 04/04- 160/94 ? 03/28- 145/90 ? 03/21- 159/92 ? 03/14- 172/89 ? 03/07- 126/78 ? ?Diabetes- A1c 6.1 06/22/2021,  off metformin due to weight loss, remains on carb modified diet, diabetic eye exam due 08/2021 ?Memory deficit- BIMS score 15 07/03/2021, CT head 12/2020 atrophy and chronic microvascular ischemic changes noted, no behavioral outbursts or mood changes ?Shuffling gait- continues to use wheelchair most of time ?HxTIA- remains on plavix and fenofibrate, allergy to statins ?HLD- LDL 75 10/2020, remains on fenofibrate due to statin allergy ? ?Past Medical History:  ?Diagnosis Date  ? Cancer [facility])   ? skin  ? CKD (chronic kidney disease) 06/24/2013  ? Cochlear implant in place   ? Coronary artery disease   ? a. s/p CABG 2000; b. myoview 9/08: inf-septal scar with mild peri-infarct ischemia (low risk);  c.  Echo 8/13: mild focal basal septal hypertrophy, Gr 1 DD, mild LAE;  d. Lexiscan Myoview (06/20/2013): No reversible ischemia, inferior and septal infarct, inferior and septal hypokinesis, EF 60%;  e.  Echo (06/20/2011): EF 50-55%, normal wall motion, grade 1 diastolic dysfunction, mild LAE  ? Diverticular disease   ? Duodenitis   ? Elevated PSA   ? GERD (gastroesophageal reflux disease)   ? Gout   ? Humerus fracture   ? right  ? Hydronephrosis with ureteropelvic junction obstruction   ? Hypercalcemia 02/02/2012  ? a. 01/2012: 10.7.  ? Hyperlipidemia   ? Hypertension   ? Pancreatitis   ? a. ? Simvastatin related  ? Pneumonia age 31  or 7  ? "I think only once" (06/19/2013)  ? Syncope   ? a. 06/2013  ? TIA (transient ischemic attack)   ? a. 01/2012 - presentation as acute imbalance;  b. 01/2012 carotid U/S w/o signif stenosis;  c. 01/2012 Echo: EF 60-65%, Gr 1 DD.;  d.  Carotid US (06/20/2013): 1-39% bilateral ICA stenosis  ? Tubular adenoma polyp of rectum   ? Type II diabetes mellitus ([place])   ? "diet and exercise controlled at present" (06/19/2013)  ? ?Past Surgical History:  ?Procedure Laterality Date  ?  APPENDECTOMY    ? CARDIAC CATHETERIZATION  2000  ? CHOLECYSTECTOMY    ? COCHLEAR IMPLANT Right 1990's  ? COLONOSCOPY W/ POLYPECTOMY    ? no polyp 2012  ? CORONARY ARTERY BYPASS GRAFT  2000  ? CABG X5  ? INGUINAL HERNIA REPAIR Right 09/15/2015  ? Procedure: OPEN REPAIR RIGHT INGUINAL HERNIA ;  Surgeon: Jackolyn Confer, MD;  Location: [facility];  Service: General;  Laterality: Right;  ? INSERTION OF MESH Right 09/15/2015  ? Procedure: INSERTION OF MESH;  Surgeon: Jackolyn Confer, MD;  Location: [facility];  Service: General;  Laterality: Right;  ? PARATHYROIDECTOMY  1978  ? POLYPECTOMY    ? REVERSE SHOULDER ARTHROPLASTY Right 12/30/2014  ? Procedure: Right shoulder ORIF, Biomet S3 Plate and ZImmer cable system ;  Surgeon: Netta Cedars, MD;  Location: [facility];  Service: Orthopedics;  Laterality: Right;  ? right ankle surgery  age 48  ? TONSILLECTOMY  1943  ? trachestomy as child for throat closing    ? ? ?Allergies  ?Allergen Reactions  ? Simvastatin Other (See Comments)  ?  Possible statin-induced pancreatitis  ? ? ?Outpatient Encounter Medications as of 09/20/2021  ?Medication Sig  ? acetaminophen (TYLENOL) 500 MG tablet Take 1,000 mg by mouth 2 (two) times daily as needed for mild pain.  ? allopurinol (ZYLOPRIM) 100 MG tablet Take 100 mg by mouth daily.  ? Cholecalciferol 50 MCG (2000 UT) TABS Take 2,000 Units by mouth in the morning.  ? Choline Fenofibrate (FENOFIBRIC ACID) 135 MG CPDR Take 1 tablet by mouth in the morning.  ? clopidogrel (PLAVIX) 75 MG tablet Take 75 mg by mouth daily.  ? colchicine 0.6 MG tablet Take 0.6 mg by mouth daily as needed (as directed for gout flares).  ? finasteride (PROSCAR) 5 MG tablet Take 5 mg by mouth every evening.  ? fluticasone (FLONASE) 50 MCG/ACT nasal spray Place 2 sprays into both nostrils daily.  ? ipratropium-albuterol (DUONEB) 0.5-2.5 (3) MG/3ML SOLN Take 3 mLs by nebulization every 6 (six) hours as needed.  ? zinc oxide 20 % ointment Apply 1 application topically as needed for  irritation.  ? ?No facility-administered encounter medications on file as of 09/20/2021.  ? ? ?Review of Systems  ?Constitutional:  Negative for activity change, appetite change, chills, fatigue and fever.  ?HENT:  Positive for hearing loss. Negative for congestion and trouble swallowing.   ?Eyes:  Negative for visual disturbance.  ?Respiratory:  Negative for cough, shortness of breath and wheezing.   ?Cardiovascular:  Negative for chest pain and leg swelling.  ?Gastrointestinal:  Negative for abdominal distention, abdominal pain, constipation, diarrhea and nausea.  ?Genitourinary:  Negative for dysuria, frequency and hematuria.  ?Musculoskeletal:  Positive for gait problem.  ?Skin:  Negative for wound.  ?Neurological:  Positive for weakness. Negative for dizziness and headaches.  ?Psychiatric/Behavioral:  Positive for confusion. Negative for dysphoric mood. The patient is not nervous/anxious.   ? ?  Immunization History  ?Administered Date(s) Administered  ? Fluad Quad(high Dose 65+) 04/01/2019  ? Influenza Split 04/13/2011, 05/02/2012  ? Influenza Whole 05/22/2007, 04/12/2008, 05/02/2009, 04/14/2010  ? Influenza, High Dose Seasonal PF 04/17/2013, 03/16/2014, 03/05/2016, 03/18/2017  ? Influenza,inj,Quad PF,6+ Mos 03/02/2015, 03/18/2018  ? Influenza-Unspecified 03/18/2018, 04/12/2021  ? Moderna SARS-COV2 Booster Vaccination 03/08/2021  ? Moderna Sars-Covid-2 Vaccination 06/22/2019, 07/20/2019, 05/02/2020, 11/23/2020  ? PPD Test 01/02/2015  ? Pneumococcal Conjugate-13 12/29/2015  ? Pneumococcal Polysaccharide-23 04/19/2006  ? Tdap 09/04/2016  ? Zoster, Live 06/29/2015  ? ?Pertinent  Health Maintenance Due  ?Topic Date Due  ? URINE MICROALBUMIN  01/15/2020  ? OPHTHALMOLOGY EXAM  08/31/2021  ? HEMOGLOBIN A1C  09/22/2021  ? INFLUENZA VACCINE  01/16/2022  ? FOOT EXAM  07/28/2022  ? ? ?  10/25/2020  ?  8:15 AM 12/07/2020  ? 10:56 AM 12/24/2020  ? 11:43 AM 01/09/2021  ? 11:54 AM 04/12/2021  ?  1:19 PM  ?Fall Risk  ?Falls in the  past year?  1   1  ?Number of falls in past year - Comments  abrasions     ?Was there an injury with Fall?  0  1 0  ?Fall Risk Category Calculator  2   2  ?Fall Risk Category  Moderate   Moderate  ?Patient Fall Risk Level

## 2021-09-22 LAB — LIPID PANEL
Cholesterol: 188 (ref 0–200)
HDL: 41 (ref 35–70)
LDL Cholesterol: 119
LDl/HDL Ratio: 4.6
Triglycerides: 163 — AB (ref 40–160)

## 2021-09-22 LAB — BASIC METABOLIC PANEL
BUN: 33 — AB (ref 4–21)
CO2: 26 — AB (ref 13–22)
Chloride: 106 (ref 99–108)
Creatinine: 1.4 — AB (ref 0.6–1.3)
Glucose: 98
Potassium: 3.9 mEq/L (ref 3.5–5.1)
Sodium: 139 (ref 137–147)

## 2021-09-22 LAB — COMPREHENSIVE METABOLIC PANEL: Calcium: 10.4 (ref 8.7–10.7)

## 2021-10-05 ENCOUNTER — Non-Acute Institutional Stay (SKILLED_NURSING_FACILITY): Payer: Federal, State, Local not specified - PPO | Admitting: Internal Medicine

## 2021-10-05 ENCOUNTER — Encounter: Payer: Self-pay | Admitting: Internal Medicine

## 2021-10-05 DIAGNOSIS — E119 Type 2 diabetes mellitus without complications: Secondary | ICD-10-CM

## 2021-10-05 DIAGNOSIS — Z951 Presence of aortocoronary bypass graft: Secondary | ICD-10-CM

## 2021-10-05 DIAGNOSIS — G459 Transient cerebral ischemic attack, unspecified: Secondary | ICD-10-CM

## 2021-10-05 DIAGNOSIS — I10 Essential (primary) hypertension: Secondary | ICD-10-CM | POA: Diagnosis not present

## 2021-10-05 DIAGNOSIS — R2689 Other abnormalities of gait and mobility: Secondary | ICD-10-CM | POA: Diagnosis not present

## 2021-10-05 DIAGNOSIS — N401 Enlarged prostate with lower urinary tract symptoms: Secondary | ICD-10-CM

## 2021-10-05 DIAGNOSIS — E559 Vitamin D deficiency, unspecified: Secondary | ICD-10-CM

## 2021-10-05 DIAGNOSIS — N138 Other obstructive and reflux uropathy: Secondary | ICD-10-CM

## 2021-10-05 DIAGNOSIS — E782 Mixed hyperlipidemia: Secondary | ICD-10-CM

## 2021-10-05 DIAGNOSIS — Z8739 Personal history of other diseases of the musculoskeletal system and connective tissue: Secondary | ICD-10-CM

## 2021-10-05 LAB — URIC ACID
PTH, 1-84 Bio-Intact: 53
Uric Acid: 7.4

## 2021-10-05 NOTE — Progress Notes (Signed)
This encounter was created in error - please disregard.

## 2021-10-05 NOTE — Progress Notes (Signed)
?Location:   [facility] Room Number: 11 ?Place of Service:  SNF (31) ?Provider:  Veleta Miners MD ? ?[name], NP ? ?Patient Care Team: ?[name], NP as PCP - General (Adult Health Nurse Practitioner) ?[name], MD as PCP - Cardiology (Cardiology) ? ?Extended Emergency Contact Information ?Primary Emergency Contact: [name],[name] ?         [place], [place] ?Home Phone: [phone] ?Mobile Phone: [phone] ?Relation: Daughter ?Secondary Emergency Contact: [name],[name] ?Address: [address]  ?         [place], [place] [address] [place] ?Home Phone: [phone] ?Mobile Phone: [phone] ?Relation: Daughter ? ?Code Status:  DNR ?Goals of care: Advanced Directive information ? ?  10/05/2021  ? 10:07 AM  ?Advanced Directives  ?Does Patient Have a Medical Advance Directive? Yes  ?Type of Paramedic of [place];Living will;Out of facility DNR (pink MOST or yellow form)  ?Does patient want to make changes to medical advance directive? No - Patient declined  ?Copy of [place] in Chart? Yes - validated most recent copy scanned in chart (See row information)  ?Pre-existing out of facility DNR order (yellow form or pink MOST form) Yellow form placed in chart (order not valid for inpatient use)  ? ? ? ?Chief Complaint  ?Patient presents with  ? Medical Management of Chronic Issues  ? Quality Metric Gaps  ?  Verified [facility] and [facility] patient is due for eye exam, hemoglobin A1C, and Shingrix. ?  ? ? ?HPI:  ?Pt is a 86 y.o. male seen today for medical management of chronic diseases.   ? ?Patient has a history of CAD, gout, HLD, hypertension and diabetes ?Patient was admitted in the hospital from 5/5-5/9 for Possible CVA ?Cannot do MRI due to Auditory implant ?No More Work up just therapy  per Neurology  ? ? Was started on Norvasc for his BO and it has been in better controlled ?He is stable. No new Nursing  issues. No Behavior issues ?Stays in his Wheelchair ?Does his ADLS with mild assist ?Cognitively doing well ? ?Wt Readings from Last 3 Encounters:  ?10/05/21 165 lb (74.8 kg)  ?09/20/21 165 lb (74.8 kg)  ?08/25/21 155 lb 3.2 oz (70.4 kg)  ? ?Past Medical History:  ?Diagnosis Date  ? Cancer [facility])   ? skin  ? CKD (chronic kidney disease) 06/24/2013  ? Cochlear implant in place   ? Coronary artery disease   ? a. s/p CABG 2000; b. myoview 9/08: inf-septal scar with mild peri-infarct ischemia (low risk);  c.  Echo 8/13: mild focal basal septal hypertrophy, Gr 1 DD, mild LAE;  d. Lexiscan Myoview (06/20/2013): No reversible ischemia, inferior and septal infarct, inferior and septal hypokinesis, EF 60%;  e.  Echo (06/20/2011): EF 50-55%, normal wall motion, grade 1 diastolic dysfunction, mild LAE  ? Diverticular disease   ? Duodenitis   ? Elevated PSA   ? GERD (gastroesophageal reflux disease)   ? Gout   ? Humerus fracture   ? right  ? Hydronephrosis with ureteropelvic junction obstruction   ? Hypercalcemia 02/02/2012  ? a. 01/2012: 10.7.  ? Hyperlipidemia   ? Hypertension   ? Pancreatitis   ? a. ? Simvastatin related  ? Pneumonia age 7 or 64  ? "I think only once" (06/19/2013)  ? Syncope   ? a. 06/2013  ? TIA (transient ischemic attack)   ? a. 01/2012 - presentation as  acute imbalance;  b. 01/2012 carotid U/S w/o signif stenosis;  c. 01/2012 Echo: EF 60-65%, Gr 1 DD.;  d.  Carotid US (06/20/2013): 1-39% bilateral ICA stenosis  ? Tubular adenoma polyp of rectum   ? Type II diabetes mellitus ([place])   ? "diet and exercise controlled at present" (06/19/2013)  ? ?Past Surgical History:  ?Procedure Laterality Date  ? APPENDECTOMY    ? CARDIAC CATHETERIZATION  2000  ? CHOLECYSTECTOMY    ? COCHLEAR IMPLANT Right 1990's  ? COLONOSCOPY W/ POLYPECTOMY    ? no polyp 2012  ? CORONARY ARTERY BYPASS GRAFT  2000  ? CABG X5  ? INGUINAL HERNIA REPAIR Right 09/15/2015  ? Procedure: OPEN REPAIR RIGHT INGUINAL HERNIA ;  Surgeon: Jackolyn Confer, MD;  Location:  [facility];  Service: General;  Laterality: Right;  ? INSERTION OF MESH Right 09/15/2015  ? Procedure: INSERTION OF MESH;  Surgeon: Jackolyn Confer, MD;  [facility];  Service: General;  Laterality: Right;  ? PARATHYROIDECTOMY  1978  ? POLYPECTOMY    ? REVERSE SHOULDER ARTHROPLASTY Right 12/30/2014  ? Procedure: Right shoulder ORIF, Biomet S3 Plate and ZImmer cable system ;  Surgeon: Netta Cedars, MD;  Location: [facility];  Service: Orthopedics;  Laterality: Right;  ? right ankle surgery  age 82  ? TONSILLECTOMY  1943  ? trachestomy as child for throat closing    ? ? ?Allergies  ?Allergen Reactions  ? Simvastatin Other (See Comments)  ?  Possible statin-induced pancreatitis  ? ? ?Allergies as of 10/05/2021   ? ?   Reactions  ? Simvastatin Other (See Comments)  ? Possible statin-induced pancreatitis  ? ?  ? ?  ?Medication List  ?  ? ?  ? Accurate as of October 05, 2021 10:07 AM. If you have any questions, ask your nurse or doctor.  ?  ?  ? ?  ? ?acetaminophen 500 MG tablet ?Commonly known as: TYLENOL ?Take 1,000 mg by mouth 2 (two) times daily as needed for mild pain. ?  ?allopurinol 100 MG tablet ?Commonly known as: ZYLOPRIM ?Take 100 mg by mouth daily. ?  ?amLODipine 2.5 MG tablet ?Commonly known as: NORVASC ?Take 1 tablet (2.5 mg total) by mouth daily. ?  ?Cholecalciferol 50 MCG (2000 UT) Tabs ?Take 2,000 Units by mouth in the morning. ?  ?clopidogrel 75 MG tablet ?Commonly known as: PLAVIX ?Take 75 mg by mouth daily. ?  ?colchicine 0.6 MG tablet ?Take 0.6 mg by mouth daily as needed (as directed for gout flares). ?  ?Fenofibric Acid 135 MG Cpdr ?Take 1 tablet by mouth in the morning. ?  ?finasteride 5 MG tablet ?Commonly known as: PROSCAR ?Take 5 mg by mouth every evening. ?  ?fluticasone 50 MCG/ACT nasal spray ?Commonly known as: FLONASE ?Place 2 sprays into both nostrils daily. ?  ?hydrALAZINE 10 MG tablet ?Commonly known as: APRESOLINE ?Take 10 mg by mouth as needed. ?What changed: Another medication with the same name  was removed. Continue taking this medication, and follow the directions you see here. ?Changed by: Virgie Dad, MD ?  ?ipratropium-albuterol 0.5-2.5 (3) MG/3ML Soln ?Commonly known as: DUONEB ?Take 3 mLs by nebulization every 6 (six) hours as needed. ?  ?zinc oxide 20 % ointment ?Apply 1 application topically as needed for irritation. ?  ? ?  ? ? ?Review of Systems  ?Constitutional:  Negative for activity change, appetite change and unexpected weight change.  ?HENT: Negative.    ?Respiratory:  Negative for cough and  shortness of breath.   ?Cardiovascular:  Negative for leg swelling.  ?Gastrointestinal:  Negative for constipation.  ?Genitourinary:  Negative for frequency.  ?Musculoskeletal:  Positive for gait problem. Negative for arthralgias and myalgias.  ?Skin: Negative.  Negative for rash.  ?Neurological:  Negative for dizziness and weakness.  ?Psychiatric/Behavioral:  Negative for confusion and sleep disturbance.   ?All other systems reviewed and are negative. ? ?Immunization History  ?Administered Date(s) Administered  ? Fluad Quad(high Dose 65+) 04/01/2019  ? Influenza Split 04/13/2011, 05/02/2012  ? Influenza Whole 05/22/2007, 04/12/2008, 05/02/2009, 04/14/2010  ? Influenza, High Dose Seasonal PF 04/17/2013, 03/16/2014, 03/05/2016, 03/18/2017  ? Influenza,inj,Quad PF,6+ Mos 03/02/2015, 03/18/2018  ? Influenza-Unspecified 03/18/2018, 04/12/2021  ? Moderna SARS-COV2 Booster Vaccination 03/08/2021  ? Moderna Sars-Covid-2 Vaccination 06/22/2019, 07/20/2019, 05/02/2020, 11/23/2020  ? PPD Test 01/02/2015  ? Pneumococcal Conjugate-13 12/29/2015  ? Pneumococcal Polysaccharide-23 04/19/2006  ? Tdap 09/04/2016  ? Zoster, Live 06/29/2015  ? ?Pertinent  Health Maintenance Due  ?Topic Date Due  ? OPHTHALMOLOGY EXAM  08/31/2021  ? HEMOGLOBIN A1C  09/22/2021  ? INFLUENZA VACCINE  01/16/2022  ? FOOT EXAM  07/28/2022  ? ? ?  10/25/2020  ?  8:15 AM 12/07/2020  ? 10:56 AM 12/24/2020  ? 11:43 AM 01/09/2021  ? 11:54 AM 04/12/2021   ?  1:19 PM  ?Fall Risk  ?Falls in the past year?  1   1  ?Number of falls in past year - Comments  abrasions     ?Was there an injury with Fall?  0  1 0  ?Fall Risk Category Calculator  2   2  ?Fall Ri

## 2021-10-26 LAB — TSH: TSH: 1.79 (ref 0.41–5.90)

## 2021-10-26 LAB — HEMOGLOBIN A1C: Hemoglobin A1C: 6.4

## 2021-10-27 ENCOUNTER — Encounter: Payer: Self-pay | Admitting: Orthopedic Surgery

## 2021-10-27 ENCOUNTER — Non-Acute Institutional Stay (SKILLED_NURSING_FACILITY): Payer: Federal, State, Local not specified - PPO | Admitting: Orthopedic Surgery

## 2021-10-27 DIAGNOSIS — Z8739 Personal history of other diseases of the musculoskeletal system and connective tissue: Secondary | ICD-10-CM

## 2021-10-27 DIAGNOSIS — E782 Mixed hyperlipidemia: Secondary | ICD-10-CM

## 2021-10-27 DIAGNOSIS — N138 Other obstructive and reflux uropathy: Secondary | ICD-10-CM

## 2021-10-27 DIAGNOSIS — N401 Enlarged prostate with lower urinary tract symptoms: Secondary | ICD-10-CM

## 2021-10-27 DIAGNOSIS — E119 Type 2 diabetes mellitus without complications: Secondary | ICD-10-CM

## 2021-10-27 DIAGNOSIS — Z951 Presence of aortocoronary bypass graft: Secondary | ICD-10-CM

## 2021-10-27 DIAGNOSIS — I10 Essential (primary) hypertension: Secondary | ICD-10-CM

## 2021-10-27 DIAGNOSIS — G459 Transient cerebral ischemic attack, unspecified: Secondary | ICD-10-CM

## 2021-10-27 DIAGNOSIS — R413 Other amnesia: Secondary | ICD-10-CM

## 2021-10-27 DIAGNOSIS — R2689 Other abnormalities of gait and mobility: Secondary | ICD-10-CM

## 2021-10-27 DIAGNOSIS — E559 Vitamin D deficiency, unspecified: Secondary | ICD-10-CM

## 2021-10-27 NOTE — Progress Notes (Signed)
?Location:  [facility] Room Number: N11/A ?Place of Service:  SNF (31) ?Provider:  [name], NP ? ?Patient Care Team: ?[name], NP as PCP - General (Adult Health Nurse Practitioner) ?[name], MD as PCP - Cardiology (Cardiology) ? ?Extended Emergency Contact Information ?Primary Emergency Contact: [name],[name] ?         [place], [place] ?Home Phone: [phone] ?Mobile Phone: [phone] ?Relation: Daughter ?Secondary Emergency Contact: [name],[name] ?Address: [address]  ?         [place], [place] [address] [place] ?Home Phone: [phone] ?Mobile Phone: [phone] ?Relation: Daughter ? ?Code Status:  DNR ?Goals of care: Advanced Directive information ? ?  10/27/2021  ?  9:25 AM  ?Advanced Directives  ?Does Patient Have a Medical Advance Directive? Yes  ?Type of Paramedic of [place];Living will;Out of facility DNR (pink MOST or yellow form)  ?Does patient want to make changes to medical advance directive? No - Patient declined  ?Copy of [place] in Chart? Yes - validated most recent copy scanned in chart (See row information)  ?Pre-existing out of facility DNR order (yellow form or pink MOST form) Yellow form placed in chart (order not valid for inpatient use)  ? ? ? ?Chief Complaint  ?Patient presents with  ? Medical Management of Chronic Issues  ?  Routine visit  ? Quality Metric Gaps  ?  Discuss the need for Shingrix vaccine, eye exam, and A1C, or post pone if patient refuses.   ? ? ?HPI:  ?Juan [name] is a 86 y.o. male seen today for medical management of chronic diseases.   ? ?He currently resides on the skilled nursing unit at [facility]. PMH: CAD, HTN, TIA, T2DM, HOH, OA, BPH, CKD, memory deficit, shuffling gait, and weight loss.  ? ?Diabetes- A1c 6.4 10/26/2021, taken off metformin due to weight loss and stable A1c, remains on carb modified diet ?HxTIA- remains on plavix and  fenofibrate, allergy to statins ?H/o CABG- remains on plavix ?HTN- BUN/creat 33/1.4 09/2021, low dose Norvasc recently started due to increased blood pressures, remains on hydralazine prn ?HLD- LDL 75 10/2020, allergic to statins ?Memory deficit- BIMS score 15 09/2021, CT head 12/2020 atrophy and chronic microvascular ischemic changes noted, very pleasant, no behavioral outbursts or mood changes ?Shuffling gait- done with Juan [name], continues to use wheelchair most of time, no recent falls ?Hypercalcemia- Calcium 10.7 02/20, PTH normal, TSH 1.79 10/26/2021, asymptomatic ?Vitamin D deficiency- Vitamin D level 16 02/20, remains on cholecalciferol ?Hx of gout- uric acid 7.4 02/23, no recent flares, remains on allopurinol/ colchicine prn ?BPH- denies retention, urinates 1-2x/night, remains on finasteride ? ?Recent blood pressures: ? 05/09- 144/79 ? 05/02- 141/86 ? 04/25- 140/79 ? ?Recent weights: ? 05/03- 168 lbs ? 04/03- 165 lbs ? 03/03- 162 lbs ? ? ? ?Past Medical History:  ?Diagnosis Date  ? Cancer [facility])   ? skin  ? CKD (chronic kidney disease) 06/24/2013  ? Cochlear implant in place   ? Coronary artery disease   ? a. s/p CABG 2000; b. myoview 9/08: inf-septal scar with mild peri-infarct ischemia (low risk);  c.  Echo 8/13: mild focal basal septal hypertrophy, Gr 1 DD, mild LAE;  d. Lexiscan Myoview (06/20/2013): No reversible ischemia, inferior and septal infarct, inferior and septal hypokinesis, EF 60%;  e.  Echo (06/20/2011): EF 50-55%, normal wall motion, grade 1 diastolic dysfunction, mild LAE  ? Diverticular disease   ?  Duodenitis   ? Elevated PSA   ? GERD (gastroesophageal reflux disease)   ? Gout   ? Humerus fracture   ? right  ? Hydronephrosis with ureteropelvic junction obstruction   ? Hypercalcemia 02/02/2012  ? a. 01/2012: 10.7.  ? Hyperlipidemia   ? Hypertension   ? Pancreatitis   ? a. ? Simvastatin related  ? Pneumonia age 40 or 32  ? "I think only once" (06/19/2013)  ? Syncope   ? a. 06/2013  ? TIA (transient ischemic  attack)   ? a. 01/2012 - presentation as acute imbalance;  b. 01/2012 carotid U/S w/o signif stenosis;  c. 01/2012 Echo: EF 60-65%, Gr 1 DD.;  d.  Carotid US (06/20/2013): 1-39% bilateral ICA stenosis  ? Tubular adenoma polyp of rectum   ? Type II diabetes mellitus ([place])   ? "diet and exercise controlled at present" (06/19/2013)  ? ?Past Surgical History:  ?Procedure Laterality Date  ? APPENDECTOMY    ? CARDIAC CATHETERIZATION  2000  ? CHOLECYSTECTOMY    ? COCHLEAR IMPLANT Right 1990's  ? COLONOSCOPY W/ POLYPECTOMY    ? no polyp 2012  ? CORONARY ARTERY BYPASS GRAFT  2000  ? CABG X5  ? INGUINAL HERNIA REPAIR Right 09/15/2015  ? Procedure: OPEN REPAIR RIGHT INGUINAL HERNIA ;  Surgeon: Jackolyn Confer, MD;  Location: [facility] ORS;  Service: General;  Laterality: Right;  ? INSERTION OF MESH Right 09/15/2015  ? Procedure: INSERTION OF MESH;  Surgeon: Jackolyn Confer, MD;  Location: [facility];  Service: General;  Laterality: Right;  ? PARATHYROIDECTOMY  1978  ? POLYPECTOMY    ? REVERSE SHOULDER ARTHROPLASTY Right 12/30/2014  ? Procedure: Right shoulder ORIF, Biomet S3 Plate and ZImmer cable system ;  Surgeon: Netta Cedars, MD;  Location: [facility];  Service: Orthopedics;  Laterality: Right;  ? right ankle surgery  age 69  ? TONSILLECTOMY  1943  ? trachestomy as child for throat closing    ? ? ?Allergies  ?Allergen Reactions  ? Simvastatin Other (See Comments)  ?  Possible statin-induced pancreatitis  ? ? ?Outpatient Encounter Medications as of 10/27/2021  ?Medication Sig  ? acetaminophen (TYLENOL) 500 MG tablet Take 1,000 mg by mouth 2 (two) times daily as needed for mild pain.  ? allopurinol (ZYLOPRIM) 100 MG tablet Take 100 mg by mouth daily.  ? amLODipine (NORVASC) 2.5 MG tablet Take 1 tablet (2.5 mg total) by mouth daily.  ? Cholecalciferol 50 MCG (2000 UT) TABS Take 2,000 Units by mouth in the morning.  ? Choline Fenofibrate (FENOFIBRIC ACID) 135 MG CPDR Take 1 tablet by mouth in the morning.  ? clopidogrel (PLAVIX) 75 MG tablet Take 75 mg  by mouth daily.  ? colchicine 0.6 MG tablet Take 0.6 mg by mouth daily as needed (as directed for gout flares).  ? finasteride (PROSCAR) 5 MG tablet Take 5 mg by mouth every evening.  ? fluticasone (FLONASE) 50 MCG/ACT nasal spray Place 2 sprays into both nostrils daily.  ? hydrALAZINE (APRESOLINE) 10 MG tablet Take 10 mg by mouth as needed.  ? ipratropium-albuterol (DUONEB) 0.5-2.5 (3) MG/3ML SOLN Take 3 mLs by nebulization every 6 (six) hours as needed.  ? zinc oxide 20 % ointment Apply 1 application topically as needed for irritation.  ? ?No facility-administered encounter medications on file as of 10/27/2021.  ? ? ?Review of Systems  ?Constitutional:  Negative for activity change, appetite change, chills, fatigue and fever.  ?HENT:  Positive for hearing loss. Negative for trouble swallowing.   ?  Eyes:  Negative for visual disturbance.  ?Respiratory:  Negative for cough, shortness of breath and wheezing.   ?Cardiovascular:  Negative for chest pain and leg swelling.  ?Gastrointestinal:  Negative for abdominal distention, abdominal pain, constipation, diarrhea, nausea and vomiting.  ?Genitourinary:  Negative for dysuria, frequency and hematuria.  ?Musculoskeletal:  Positive for gait problem. Negative for arthralgias and back pain.  ?Skin:  Negative for wound.  ?Neurological:  Positive for weakness. Negative for dizziness and headaches.  ?Psychiatric/Behavioral:  Positive for confusion. Negative for dysphoric mood and sleep disturbance. The patient is not nervous/anxious.   ? ?Immunization History  ?Administered Date(s) Administered  ? Fluad Quad(high Dose 65+) 04/01/2019  ? Influenza Split 04/13/2011, 05/02/2012  ? Influenza Whole 05/22/2007, 04/12/2008, 05/02/2009, 04/14/2010  ? Influenza, High Dose Seasonal PF 04/17/2013, 03/16/2014, 03/05/2016, 03/18/2017  ? Influenza,inj,Quad PF,6+ Mos 03/02/2015, 03/18/2018  ? Influenza-Unspecified 03/18/2018, 04/12/2021  ? Moderna SARS-COV2 Booster Vaccination 03/08/2021  ?  Moderna Sars-Covid-2 Vaccination 06/22/2019, 07/20/2019, 05/02/2020, 11/23/2020  ? PPD Test 01/02/2015  ? Pneumococcal Conjugate-13 12/29/2015  ? Pneumococcal Polysaccharide-23 04/19/2006  ? Tdap 09/04/2016  ? Zos

## 2021-11-27 ENCOUNTER — Non-Acute Institutional Stay (SKILLED_NURSING_FACILITY): Payer: Federal, State, Local not specified - PPO | Admitting: Orthopedic Surgery

## 2021-11-27 ENCOUNTER — Encounter: Payer: Self-pay | Admitting: Orthopedic Surgery

## 2021-11-27 DIAGNOSIS — G459 Transient cerebral ischemic attack, unspecified: Secondary | ICD-10-CM

## 2021-11-27 DIAGNOSIS — Z951 Presence of aortocoronary bypass graft: Secondary | ICD-10-CM

## 2021-11-27 DIAGNOSIS — E559 Vitamin D deficiency, unspecified: Secondary | ICD-10-CM

## 2021-11-27 DIAGNOSIS — E119 Type 2 diabetes mellitus without complications: Secondary | ICD-10-CM

## 2021-11-27 DIAGNOSIS — N138 Other obstructive and reflux uropathy: Secondary | ICD-10-CM

## 2021-11-27 DIAGNOSIS — R635 Abnormal weight gain: Secondary | ICD-10-CM | POA: Diagnosis not present

## 2021-11-27 DIAGNOSIS — I10 Essential (primary) hypertension: Secondary | ICD-10-CM

## 2021-11-27 DIAGNOSIS — R413 Other amnesia: Secondary | ICD-10-CM

## 2021-11-27 DIAGNOSIS — E782 Mixed hyperlipidemia: Secondary | ICD-10-CM

## 2021-11-27 DIAGNOSIS — Z8739 Personal history of other diseases of the musculoskeletal system and connective tissue: Secondary | ICD-10-CM

## 2021-11-27 DIAGNOSIS — N401 Enlarged prostate with lower urinary tract symptoms: Secondary | ICD-10-CM

## 2021-11-27 DIAGNOSIS — R2689 Other abnormalities of gait and mobility: Secondary | ICD-10-CM

## 2021-11-27 NOTE — Progress Notes (Addendum)
Location:  [facility] Room Number: N11/A Place of Service:  SNF (31) Provider: [name], NP  Patient Care Team: [name], NP as PCP - General (Adult Health Nurse Practitioner) [name], MD as PCP - Cardiology (Cardiology)  Extended Emergency Contact Information Primary Emergency Contact: [name],[name]          [place], [place] of [place] Phone: [phone] Mobile Phone: [phone] Relation: Daughter Secondary Emergency Contact: [name],[name] Address: [address]           [place], [place] [address] [name] of [place] Phone: [phone] Mobile Phone: [phone] Relation: Daughter  Code Status:  DNR Goals of care: Advanced Directive information    11/27/2021    9:32 AM  Advanced Directives  Does Patient Have a Medical Advance Directive? Yes  Type of Paramedic of [place];Living will;Out of facility DNR (pink MOST or yellow form)  Does patient want to make changes to medical advance directive? No - Patient declined  Copy of [place] in Chart? Yes - validated most recent copy scanned in chart (See row information)  Pre-existing out of facility DNR order (yellow form or pink MOST form) Yellow form placed in chart (order not valid for inpatient use)     Chief Complaint  Patient presents with   Medical Management of Chronic Issues    Routine visit.   Quality Metric Gaps    Discuss the need for eye exam and Shingrix vaccine, or post pone if patient refuses.     HPI:  Pt is a 86 y.o. Juan [name] seen today for medical management of chronic diseases.    He currently resides on the skilled nursing unit at [facility]. PMH: CAD, HTN, TIA, T2DM, HOH, OA, BPH, CKD, memory deficit, shuffling gait, and weight loss.  Weight gain- see weight trends below, TSH 1.79 10/26/2021 T2DM- A1c 6.4 10/26/2021, taken off metformin due to weight loss and stable A1c, remains on carb  modified diet HxTIA- remains on plavix and fenofibrate H/o CABG- remains on plavix HTN- BUN/creat 33/1.4 09/2021, Norvasc restarted,  remains on hydralazine prn HLD- LDL Juan 10/2020, allergic to statins Memory deficit- BIMS score 15 09/2021, CT head 12/2020 atrophy and chronic microvascular ischemic changes noted, very pleasant, no behavioral outbursts or mood changes Shuffling gait- continues to use wheelchair most of time, no recent falls Hypercalcemia- Calcium 10.4 (04/07)> 10.7 (02/20), PTH normal, TSH 1.79 10/26/2021, asymptomatic Vitamin D deficiency- Vitamin D level 16 02/20, remains on cholecalciferol Hx of gout- uric acid 7.4 02/23, no recent flares, remains on allopurinol and colchicine prn BPH- denies retention, urinates 1-2x/night, remains on finasteride  No recent falls or injuries.   Scheduled to receive covid booster vaccine 11/29/2021.   Recent blood pressures:  06/06- 139/73  05/30- 172/93  05/23- 122/69  Recent weights:  06/12- 168 lbs  06/01- 171 lbs  05/01- 167 lbs  04/03- 165 lbs  12/01- 151 lbs   Past Medical History:  Diagnosis Date   Cancer ([place])    skin   CKD (chronic kidney disease) 06/24/2013   Cochlear implant in place    Coronary artery disease    a. s/p CABG 2000; b. myoview 9/08: inf-septal scar with mild peri-infarct ischemia (low risk);  c.  Echo 8/13: mild focal basal septal hypertrophy, Gr 1 DD, mild LAE;  d. Lexiscan Myoview (06/20/2013): No reversible ischemia, inferior and septal infarct, inferior and septal hypokinesis, EF 60%;  e.  Echo (06/20/2011): EF 50-55%, normal wall motion, grade 1 diastolic dysfunction, mild LAE   Diverticular disease    Duodenitis    Elevated PSA    GERD (gastroesophageal reflux disease)    Gout    Humerus fracture    right   Hydronephrosis with ureteropelvic junction obstruction    Hypercalcemia 02/02/2012   a. 01/2012: 10.7.   Hyperlipidemia    Hypertension    Pancreatitis    a. ? Simvastatin related    Pneumonia age 55 or 7   "I think only once" (06/19/2013)   Syncope    a. 06/2013   TIA (transient ischemic attack)    a. 01/2012 - presentation as acute imbalance;  b. 01/2012 carotid U/S w/o signif stenosis;  c. 01/2012 Echo: EF 60-65%, Gr 1 DD.;  d.  Carotid US (06/20/2013): 1-39% bilateral ICA stenosis   Tubular adenoma polyp of rectum    Type II diabetes mellitus ([place])    "diet and exercise controlled at present" (06/19/2013)   Past Surgical History:  Procedure Laterality Date   APPENDECTOMY     CARDIAC CATHETERIZATION  2000   CHOLECYSTECTOMY     COCHLEAR IMPLANT Right 1990's   COLONOSCOPY W/ POLYPECTOMY     no polyp 2012   CORONARY ARTERY BYPASS GRAFT  2000   CABG X5   INGUINAL HERNIA REPAIR Right 09/15/2015   Procedure: OPEN REPAIR RIGHT INGUINAL HERNIA ;  Surgeon: Jackolyn Confer, MD;  Location: [facility];  Service: General;  Laterality: Right;   INSERTION OF MESH Right 09/15/2015   Procedure: INSERTION OF MESH;  Surgeon: Jackolyn Confer, MD;  Location: [facility];  Service: General;  Laterality: Right;   PARATHYROIDECTOMY  1978   POLYPECTOMY     REVERSE SHOULDER ARTHROPLASTY Right 12/30/2014   Procedure: Right shoulder ORIF, Biomet S3 Plate and ZImmer cable system ;  Surgeon: Netta Cedars, MD;  Location: [facility];  Service: Orthopedics;  Laterality: Right;   right ankle surgery  age 75   TONSILLECTOMY  2   trachestomy as child for throat closing      Allergies  Allergen Reactions   Simvastatin Other (See Comments)    Possible statin-induced pancreatitis    Outpatient Encounter Medications as of 11/27/2021  Medication Sig   acetaminophen (TYLENOL) 500 MG tablet Take 1,000 mg by mouth 2 (two) times daily as needed for mild pain.   allopurinol (ZYLOPRIM) 100 MG tablet Take 100 mg by mouth daily.   amLODipine (NORVASC) 2.5 MG tablet Take 1 tablet (2.5 mg total) by mouth daily.   Cholecalciferol 50 MCG (2000 UT) TABS Take 2,000 Units by mouth in the morning.   Choline Fenofibrate (FENOFIBRIC  ACID) 135 MG CPDR Take 1 tablet by mouth in the morning.   clopidogrel (PLAVIX) Juan MG tablet Take Juan mg by mouth daily.   colchicine 0.6 MG tablet Take 0.6 mg by mouth daily as needed (as directed for gout flares).   finasteride (PROSCAR) 5 MG tablet Take 5 mg by mouth every evening.   hydrALAZINE (APRESOLINE) 10 MG tablet Take 10 mg by mouth as needed.   ipratropium-albuterol (DUONEB) 0.5-2.5 (3) MG/3ML SOLN Take 3 mLs by nebulization every 6 (six) hours as needed.   zinc oxide 20 % ointment Apply 1 application topically as needed for irritation.   [DISCONTINUED] fluticasone (FLONASE) 50 MCG/ACT nasal spray Place 2 sprays into both nostrils daily.   No facility-administered encounter medications on file as of 11/27/2021.    Review of Systems  Constitutional:  Negative for activity change, appetite change, chills, fatigue and fever.  HENT:  Positive for hearing loss. Negative for congestion and trouble swallowing.   Eyes:  Negative for visual disturbance.  Respiratory:  Negative for cough, shortness of breath and wheezing.   Cardiovascular:  Negative for chest pain and leg swelling.  Gastrointestinal:  Negative for abdominal distention, abdominal pain, constipation, diarrhea, nausea and vomiting.  Endocrine: Negative for polydipsia, polyphagia and polyuria.  Genitourinary:  Positive for frequency. Negative for dysuria and hematuria.  Musculoskeletal:  Positive for gait problem.  Skin:  Negative for wound.  Neurological:  Positive for weakness. Negative for dizziness and headaches.  Psychiatric/Behavioral:  Positive for confusion. Negative for dysphoric mood. The patient is not nervous/anxious.     Immunization History  Administered Date(s) Administered   Fluad Quad(high Dose 65+) 04/01/2019   Influenza Split 04/13/2011, 05/02/2012   Influenza Whole 05/22/2007, 04/12/2008, 05/02/2009, 04/14/2010   Influenza, High Dose Seasonal PF 04/17/2013, 03/16/2014, 03/05/2016, 03/18/2017    Influenza,inj,Quad PF,6+ Mos 03/02/2015, 03/18/2018   Influenza-Unspecified 03/18/2018, 04/12/2021   Moderna SARS-COV2 Booster Vaccination 03/08/2021   Moderna Sars-Covid-2 Vaccination 06/22/2019, 07/20/2019, 05/02/2020, 11/23/2020   PPD Test 01/02/2015   Pneumococcal Conjugate-13 12/29/2015   Pneumococcal Polysaccharide-23 04/19/2006   Tdap 09/04/2016   Zoster, Live 06/29/2015   Pertinent  Health Maintenance Due  Topic Date Due   OPHTHALMOLOGY EXAM  08/31/2021   INFLUENZA VACCINE  01/16/2022   HEMOGLOBIN A1C  04/28/2022   FOOT EXAM  07/28/2022      10/25/2020    8:15 AM 12/07/2020   10:56 AM 12/24/2020   11:43 AM 01/09/2021   11:54 AM 04/12/2021    1:19 PM  Fall Risk  Falls in the past year?  1   1  Number of falls in past year - Comments  abrasions     Was there an injury with Fall?  0  1 0  Fall Risk Category Calculator  2   2  Fall Risk Category  Moderate   Moderate  Patient Fall Risk Level High fall risk  High fall risk High fall risk   Patient at Risk for Falls Due to    History of fall(s);Impaired balance/gait;Impaired mobility;Mental status change   Fall risk Follow up    Falls evaluation completed;Education provided;Falls prevention discussed    Functional Status Survey:    Vitals:   11/27/21 0928  BP: 139/73  Pulse: 77  Resp: 20  Temp: (!) 97.1 F (36.2 C)  SpO2: 97%  Weight: 168 lb (76.2 kg)  Height: '5\' 9"'$  (1.753 m)   Body mass index is 24.81 kg/m. Physical Exam Vitals reviewed.  Constitutional:      General: He is not in acute distress. HENT:     Head: Normocephalic.     Right Ear: There is no impacted cerumen.     Left Ear: There is no impacted cerumen.     Ears:     Comments: Right cochlear implant Eyes:     General:        Right eye: No discharge.        Left eye: No discharge.  Cardiovascular:     Rate and Rhythm: Normal rate and regular rhythm.     Pulses: Normal pulses.     Heart sounds: Normal heart sounds.  Pulmonary:     Effort:  Pulmonary effort is normal. No respiratory distress.     Breath sounds: Normal breath sounds. No wheezing.  Abdominal:     General: Bowel  sounds are normal. There is no distension.     Palpations: Abdomen is soft.     Tenderness: There is no abdominal tenderness.  Musculoskeletal:     Cervical back: Neck supple.     Right lower leg: No edema.     Left lower leg: No edema.  Skin:    General: Skin is warm and dry.     Capillary Refill: Capillary refill takes less than 2 seconds.  Neurological:     General: No focal deficit present.     Mental Status: He is alert and oriented to person, place, and time.     Motor: Weakness present.     Gait: Gait abnormal.     Comments: wheelchair  Psychiatric:        Mood and Affect: Mood normal.        Behavior: Behavior normal.        Cognition and Memory: Memory is impaired.     Comments: Very pleasant, follows commands, alert to self/person/situation     Labs reviewed: Recent Labs    12/24/20 1224 12/29/20 0000 08/10/21 0000 08/11/21 0000 09/22/21 0000  NA 139   < > 141 141 139  K 3.4*   < > 4.1 4.1 3.9  CL 108   < > 108 108 106  CO2 25   < > 29* 29* 26*  GLUCOSE 195*  --   --   --   --   BUN 34*   < > 33* 33* 33*  CREATININE 1.32*   < > 1.2 1.2 1.4*  CALCIUM 10.2   < > 10.7 10.7 10.4   < > = values in this interval not displayed.   Recent Labs    12/24/20 1224 12/29/20 0000 07/31/21 0000 08/10/21 0000 08/11/21 0000  AST 34   < > '16 17 17  '$ ALT 24   < > 8* 12 12  ALKPHOS 31*   < > 38 36 36  BILITOT 0.7  --   --   --   --   PROT 7.0  --   --   --   --   ALBUMIN 3.4*   < > 4.1 4.4 4.4   < > = values in this interval not displayed.   Recent Labs    12/24/20 1224 12/29/20 0000 07/31/21 0000  WBC 5.3 8.3 4.6  NEUTROABS 3.9 5,810.00 2,433.00  HGB 11.2* 12.5* 13.6  HCT 35.4* 39* 41  MCV 90.5  --   --   PLT 234 378 239   Lab Results  Component Value Date   TSH 1.79 10/26/2021   Lab Results  Component Value Date    HGBA1C 6.4 10/26/2021   Lab Results  Component Value Date   CHOL 188 09/22/2021   HDL 41 09/22/2021   LDLCALC 119 09/22/2021   LDLDIRECT 96.0 08/23/2020   TRIG 163 (A) 09/22/2021   CHOLHDL 4.4 10/21/2020    Significant Diagnostic Results in last 30 days:  No results found.  Assessment/Plan 1. Weight gain - ongoing - now 168 lbs, was 151 05/18/2021 - cont monthly weights  2. Diabetes mellitus without complication ([place]) - N0U 6.4 (05/11), was 5.6 (03/24/2021) - metformin stopped due to weight loss, now gaining weight - recheck A1c 12/2021 due to weight gain  3. TIA (transient ischemic attack) - cont Plavix and fenofibrate  4. Hx of CABG - followed by cardiology - cont Plavix  5. Essential hypertension, benign - controlled - Norvasc recently restarted -  cont hydralazine prn  6. Mixed hyperlipidemia - LDL 119 09/22/2021 - cont low fat diet  - cont fenofibrate  7. Memory deficit - evaluated by neurology - cont skilled nursing care  8. Shuffling gait - no recent falls - cont w/c  9. Hypercalcemia - calcium stable - no further workup at this time  10. Vitamin D deficiency - cont vitamin D supplement  11. History of gout - no recent flares - cont allopurinol and colchicine prn  12. Benign prostatic hyperplasia with urinary obstruction - cont finasteride   Family/ staff Communication: plan discussed with patient and nurse  Labs/tests ordered:  A1c 12/2021

## 2021-12-26 LAB — HEMOGLOBIN A1C: Hemoglobin A1C: 6.4

## 2021-12-28 ENCOUNTER — Non-Acute Institutional Stay (SKILLED_NURSING_FACILITY): Payer: Medicare Other | Admitting: Internal Medicine

## 2021-12-28 ENCOUNTER — Encounter: Payer: Self-pay | Admitting: Internal Medicine

## 2021-12-28 DIAGNOSIS — Z8739 Personal history of other diseases of the musculoskeletal system and connective tissue: Secondary | ICD-10-CM

## 2021-12-28 DIAGNOSIS — I10 Essential (primary) hypertension: Secondary | ICD-10-CM | POA: Diagnosis not present

## 2021-12-28 DIAGNOSIS — G459 Transient cerebral ischemic attack, unspecified: Secondary | ICD-10-CM | POA: Diagnosis not present

## 2021-12-28 DIAGNOSIS — R2689 Other abnormalities of gait and mobility: Secondary | ICD-10-CM

## 2021-12-28 DIAGNOSIS — E119 Type 2 diabetes mellitus without complications: Secondary | ICD-10-CM | POA: Diagnosis not present

## 2021-12-28 DIAGNOSIS — R635 Abnormal weight gain: Secondary | ICD-10-CM

## 2021-12-28 DIAGNOSIS — N138 Other obstructive and reflux uropathy: Secondary | ICD-10-CM

## 2021-12-28 DIAGNOSIS — E782 Mixed hyperlipidemia: Secondary | ICD-10-CM

## 2021-12-28 DIAGNOSIS — Z951 Presence of aortocoronary bypass graft: Secondary | ICD-10-CM

## 2021-12-28 DIAGNOSIS — N401 Enlarged prostate with lower urinary tract symptoms: Secondary | ICD-10-CM

## 2021-12-28 NOTE — Progress Notes (Signed)
Location:  [facility] Room Number: 11 Place of Service:  SNF (31)  Provider:   Code Status:  Goals of Care:     12/28/2021    4:11 PM  Advanced Directives  Does Patient Have a Medical Advance Directive? Yes  Type of Paramedic of [place];Living will;Out of facility DNR (pink MOST or yellow form)  Does patient want to make changes to medical advance directive? No - Patient declined  Copy of [place] in Chart? Yes - validated most recent copy scanned in chart (See row information)  Pre-existing out of facility DNR order (yellow form or pink MOST form) Yellow form placed in chart (order not valid for inpatient use)     Chief Complaint  Patient presents with   Medical Management of Chronic Issues    HPI: Patient is a 86 y.o. male seen today for medical management of chronic diseases.     Lives in SNF Patient has a history of CAD, gout, HLD, hypertension and diabetes Patient was admitted in the hospital from 5/22-for Possible CVA Cannot do MRI due to Auditory implant No More Work up just therapy  per Neurology    Continuous to stay stable Uses wheelchair  Can do transfers Mild assist for ADLS Had no Complains  Wt Readings from Last 3 Encounters:  12/28/21 161 lb (73 kg)  11/27/21 168 lb (76.2 kg)  10/27/21 169 lb 1.6 oz (76.7 kg)       Past Medical History:  Diagnosis Date   Cancer ([place])    skin   CKD (chronic kidney disease) 06/24/2013   Cochlear implant in place    Coronary artery disease    a. s/p CABG 2000; b. myoview 9/08: inf-septal scar with mild peri-infarct ischemia (low risk);  c.  Echo 8/13: mild focal basal septal hypertrophy, Gr 1 DD, mild LAE;  d. Lexiscan Myoview (06/20/2013): No reversible ischemia, inferior and septal infarct, inferior and septal hypokinesis, EF 60%;  e.  Echo (06/20/2011): EF 50-55%, normal wall motion, grade 1 diastolic dysfunction, mild LAE   Diverticular disease     Duodenitis    Elevated PSA    GERD (gastroesophageal reflux disease)    Gout    Humerus fracture    right   Hydronephrosis with ureteropelvic junction obstruction    Hypercalcemia 02/02/2012   a. 01/2012: 10.7.   Hyperlipidemia    Hypertension    Pancreatitis    a. ? Simvastatin related   Pneumonia age 31 or 7   "I think only once" (06/19/2013)   Syncope    a. 06/2013   TIA (transient ischemic attack)    a. 01/2012 - presentation as acute imbalance;  b. 01/2012 carotid U/S w/o signif stenosis;  c. 01/2012 Echo: EF 60-65%, Gr 1 DD.;  d.  Carotid US (06/20/2013): 1-39% bilateral ICA stenosis   Tubular adenoma polyp of rectum    Type II diabetes mellitus ([place])    "diet and exercise controlled at present" (06/19/2013)    Past Surgical History:  Procedure Laterality Date   APPENDECTOMY     CARDIAC CATHETERIZATION  2000   CHOLECYSTECTOMY     COCHLEAR IMPLANT Right 1990's   COLONOSCOPY W/ POLYPECTOMY     no polyp 2012   CORONARY ARTERY BYPASS GRAFT  2000   CABG X5   INGUINAL HERNIA REPAIR Right 09/15/2015   Procedure: OPEN REPAIR RIGHT INGUINAL HERNIA ;  Surgeon: Jackolyn Confer, MD;  Location: [facility];  Service: General;  Laterality: Right;   INSERTION OF MESH Right 09/15/2015   Procedure: INSERTION OF MESH;  Surgeon: Jackolyn Confer, MD;  Location: [facility];  Service: General;  Laterality: Right;   PARATHYROIDECTOMY  1978   POLYPECTOMY     REVERSE SHOULDER ARTHROPLASTY Right 12/30/2014   Procedure: Right shoulder ORIF, Biomet S3 Plate and ZImmer cable system ;  Surgeon: Netta Cedars, MD;  Location: [facility];  Service: Orthopedics;  Laterality: Right;   right ankle surgery  age 79   TONSILLECTOMY  29   trachestomy as child for throat closing      Allergies  Allergen Reactions   Simvastatin Other (See Comments)    Possible statin-induced pancreatitis    Outpatient Encounter Medications as of 12/28/2021  Medication Sig   acetaminophen (TYLENOL) 500 MG tablet Take 1,000 mg by mouth 2 (two)  times daily as needed for mild pain.   allopurinol (ZYLOPRIM) 100 MG tablet Take 100 mg by mouth daily.   amLODipine (NORVASC) 2.5 MG tablet Take 1 tablet (2.5 mg total) by mouth daily.   Cholecalciferol 50 MCG (2000 UT) TABS Take 2,000 Units by mouth in the morning.   Choline Fenofibrate (FENOFIBRIC ACID) 135 MG CPDR Take 1 tablet by mouth in the morning.   clopidogrel (PLAVIX) 75 MG tablet Take 75 mg by mouth daily.   colchicine 0.6 MG tablet Take 0.6 mg by mouth daily as needed (as directed for gout flares).   finasteride (PROSCAR) 5 MG tablet Take 5 mg by mouth every evening.   hydrALAZINE (APRESOLINE) 10 MG tablet Take 10 mg by mouth as needed.   ipratropium-albuterol (DUONEB) 0.5-2.5 (3) MG/3ML SOLN Take 3 mLs by nebulization every 6 (six) hours as needed.   zinc oxide 20 % ointment Apply 1 application topically as needed for irritation.   No facility-administered encounter medications on file as of 12/28/2021.    Review of Systems:  Review of Systems  Constitutional:  Negative for activity change, appetite change and unexpected weight change.  HENT: Negative.    Respiratory:  Negative for cough and shortness of breath.   Cardiovascular:  Negative for leg swelling.  Gastrointestinal:  Negative for constipation.  Genitourinary:  Negative for frequency.  Musculoskeletal:  Negative for arthralgias, gait problem and myalgias.  Skin: Negative.  Negative for rash.  Neurological:  Negative for dizziness and weakness.  Psychiatric/Behavioral:  Negative for confusion and sleep disturbance.   All other systems reviewed and are negative.   Health Maintenance  Topic Date Due   COVID-19 Vaccine (5 - Moderna series) 05/09/2022 (Originally 05/03/2021)   INFLUENZA VACCINE  01/16/2022   HEMOGLOBIN A1C  06/28/2022   FOOT EXAM  07/28/2022   OPHTHALMOLOGY EXAM  12/01/2022   TETANUS/TDAP  09/05/2026   Pneumonia Vaccine 54+ Years old  Completed   Zoster Vaccines- Shingrix  Completed   HPV  VACCINES  Aged Out    Physical Exam: Vitals:   12/28/21 1607  BP: 115/78  Pulse: 74  Resp: 20  Temp: (!) 97.4 F (36.3 C)  SpO2: 93%  Weight: 161 lb (73 kg)  Height: '5\' 9"'$  (1.753 m)   Body mass index is 23.78 kg/m. Physical Exam Vitals reviewed.  Constitutional:      Appearance: Normal appearance.  HENT:     Head: Normocephalic.     Nose: Nose normal.     Mouth/Throat:     Mouth: Mucous membranes are moist.     Pharynx: Oropharynx is clear.  Eyes:  Pupils: Pupils are equal, round, and reactive to light.  Cardiovascular:     Rate and Rhythm: Normal rate and regular rhythm.     Pulses: Normal pulses.     Heart sounds: No murmur heard. Pulmonary:     Effort: Pulmonary effort is normal. No respiratory distress.     Breath sounds: Normal breath sounds. No rales.  Abdominal:     General: Abdomen is flat. Bowel sounds are normal.     Palpations: Abdomen is soft.  Musculoskeletal:        General: No swelling.     Cervical back: Neck supple.  Skin:    General: Skin is warm.  Neurological:     General: No focal deficit present.     Mental Status: He is alert and oriented to person, place, and time.  Psychiatric:        Mood and Affect: Mood normal.        Thought Content: Thought content normal.     Labs reviewed: Basic Metabolic Panel: Recent Labs    08/10/21 0000 08/11/21 0000 09/22/21 0000 10/26/21 0000  NA 141 141 139  --   K 4.1 4.1 3.9  --   CL 108 108 106  --   CO2 29* 29* 26*  --   BUN 33* 33* 33*  --   CREATININE 1.2 1.2 1.4*  --   CALCIUM 10.7 10.7 10.4  --   TSH  --   --   --  1.79   Liver Function Tests: Recent Labs    07/31/21 0000 08/10/21 0000 08/11/21 0000  AST '16 17 17  '$ ALT 8* 12 12  ALKPHOS 38 36 36  ALBUMIN 4.1 4.4 4.4   No results for input(s): "LIPASE", "AMYLASE" in the last 8760 hours. No results for input(s): "AMMONIA" in the last 8760 hours. CBC: Recent Labs    12/29/20 0000 07/31/21 0000  WBC 8.3 4.6  NEUTROABS  5,810.00 2,433.00  HGB 12.5* 13.6  HCT 39* 41  PLT 378 239   Lipid Panel: Recent Labs    09/22/21 0000  CHOL 188  HDL 41  LDLCALC 119  TRIG 163*   Lab Results  Component Value Date   HGBA1C 6.4 12/26/2021    Procedures since last visit: No results found.  Assessment/Plan 1. Essential hypertension, benign Bp Good levels on Norvasc  2. Diabetes mellitus without complication ([place]) No Meds AiC good  3. Shuffling gait Wheelchair bound Mild assist with ADLS Can do his transfers  4. TIA (transient ischemic attack) On Plavix Allergic to statins  5. Mixed hyperlipidemia Allergic to statins On Fenofibrate 6. Hypercalcemia Mild Elevated PTH was normal levels Vit d was low Continue to monitor No More work up right now  7. History of gout Doing well on Allopurinol  8. Benign prostatic hyperplasia with urinary obstruction Proscar  9. Hx of CABG On Plavix and Fenofibrate  10. Weight gain Stable right now Most likely due to less activity     Labs/tests ordered:  * No order type specified * Next appt:  Visit date not found

## 2022-01-10 ENCOUNTER — Encounter: Payer: Self-pay | Admitting: Orthopedic Surgery

## 2022-01-10 ENCOUNTER — Non-Acute Institutional Stay (SKILLED_NURSING_FACILITY): Payer: Federal, State, Local not specified - PPO | Admitting: Orthopedic Surgery

## 2022-01-10 DIAGNOSIS — Z0000 Encounter for general adult medical examination without abnormal findings: Secondary | ICD-10-CM

## 2022-01-10 NOTE — Patient Instructions (Signed)
  Juan [name] , Thank you for taking time to come for your Medicare Wellness Visit. I appreciate your ongoing commitment to your health goals. Please review the following plan we discussed and let me know if I can assist you in the future.   These are the goals we discussed:  Goals      Maintain Mobility and Function     Evidence-based guidance:  Emphasize the importance of physical activity and aerobic exercise as included in treatment plan; assess barriers to adherence; consider patient's abilities and preferences.  Encourage gradual increase in activity or exercise instead of stopping if pain occurs.  Reinforce individual therapy exercise prescription, such as strengthening, stabilization and stretching programs.  Promote optimal body mechanics to stabilize the spine with lifting and functional activity.  Encourage activity and mobility modifications to facilitate optimal function, such as using a log roll for bed mobility or dressing from a seated position.  Reinforce individual adaptive equipment recommendations to limit excessive spinal movements, such as a Systems analyst.  Assess adequacy of sleep; encourage use of sleep hygiene techniques, such as bedtime routine; use of white noise; dark, cool bedroom; avoiding daytime naps, heavy meals or exercise before bedtime.  Promote positions and modification to optimize sleep and sexual activity; consider pillows or positioning devices to assist in maintaining neutral spine.  Explore options for applying ergonomic principles at work and home, such as frequent position changes, using ergonomically designed equipment and working at optimal height.  Promote modifications to increase comfort with driving such as lumbar support, optimizing seat and steering wheel position, using cruise control and taking frequent rest stops to stretch and walk.   Notes:         This is a list of the screening recommended for you and due dates:  Health  Maintenance  Topic Date Due   Urine Protein Check  01/15/2020   COVID-19 Vaccine (5 - Moderna series) 05/09/2022*   Flu Shot  01/16/2022   Hemoglobin A1C  06/28/2022   Complete foot exam   07/28/2022   Eye exam for diabetics  12/01/2022   Tetanus Vaccine  09/05/2026   Pneumonia Vaccine  Completed   Zoster (Shingles) Vaccine  Completed   HPV Vaccine  Aged Out  *Topic was postponed. The date shown is not the original due date.

## 2022-01-10 NOTE — Progress Notes (Signed)
Subjective:   Juan [name] is a 86 y.o. male who presents for Medicare Annual/Subsequent preventive examination.  Place of Service: [place]  Provider: Windell Moulding, AGNP-C   Review of Systems     Cardiac Risk Factors include: advanced age (>80mn, >>71women);sedentary lifestyle;hypertension;diabetes mellitus;dyslipidemia     Objective:    Today's Vitals   01/10/22 1016  BP: (!) 147/87  Pulse: 85  Resp: 20  Temp: (!) 97.4 F (36.3 C)  SpO2: 93%  Weight: 170 lb 3.2 oz (77.2 kg)  Height: '5\' 9"'$  (1.753 m)   Body mass index is 25.13 kg/m.     01/10/2022   10:22 AM 12/28/2021    4:11 PM 11/27/2021    9:32 AM 10/27/2021    9:25 AM 10/05/2021   10:07 AM 09/20/2021    9:38 AM 08/25/2021   10:29 AM  Advanced Directives  Does Patient Have a Medical Advance Directive? Yes Yes Yes Yes Yes Yes Yes  Type of AParamedicof ASouth WiltonLiving will;Out of facility DNR (pink MOST or yellow form) H[place] ParkLiving will;Out of facility DNR (pink MOST or yellow form) HMineral RidgeLiving will;Out of facility DNR (pink MOST or yellow form) HGen[place]Living will;Out of facility DNR (pink MOST or yellow form) HShaver LakeLiving will;Out of facility DNR (pink MOST or yellow form) HLong PineLiving will;Out of facility DNR (pink MOST or yellow form) HNorthumberlandLiving will;Out of facility DNR (pink MOST or yellow form)  Does patient want to make changes to medical advance directive? No - Patient declined No - Patient declined No - Patient declined No - Patient declined No - Patient declined No - Patient declined No - Patient declined  Copy of HDripping Springsin Chart? Yes - validated most recent copy scanned in chart (See row information) Yes - validated most recent copy scanned in chart (See row information) Yes - validated most recent copy scanned  in chart (See row information) Yes - validated most recent copy scanned in chart (See row information) Yes - validated most recent copy scanned in chart (See row information) Yes - validated most recent copy scanned in chart (See row information) Yes - validated most recent copy scanned in chart (See row information)  Pre-existing out of facility DNR order (yellow form or pink MOST form) Yellow form placed in chart (order not valid for inpatient use) Yellow form placed in chart (order not valid for inpatient use) Yellow form placed in chart (order not valid for inpatient use) Yellow form placed in chart (order not valid for inpatient use) Yellow form placed in chart (order not valid for inpatient use) Yellow form placed in chart (order not valid for inpatient use) Yellow form placed in chart (order not valid for inpatient use)    Current Medications (verified) Outpatient Encounter Medications as of 01/10/2022  Medication Sig   acetaminophen (TYLENOL) 500 MG tablet Take 1,000 mg by mouth 2 (two) times daily as needed for mild pain.   allopurinol (ZYLOPRIM) 100 MG tablet Take 100 mg by mouth daily.   amLODipine (NORVASC) 2.5 MG tablet Take 1 tablet (2.5 mg total) by mouth daily.   Cholecalciferol 50 MCG (2000 UT) TABS Take 2,000 Units by mouth in the morning.   Choline Fenofibrate (FENOFIBRIC ACID) 135 MG CPDR Take 1 tablet by mouth in the morning.   clopidogrel (PLAVIX) 75 MG tablet Take 75 mg by mouth daily.  colchicine 0.6 MG tablet Take 0.6 mg by mouth daily as needed (as directed for gout flares).   finasteride (PROSCAR) 5 MG tablet Take 5 mg by mouth every evening.   ipratropium-albuterol (DUONEB) 0.5-2.5 (3) MG/3ML SOLN Take 3 mLs by nebulization every 6 (six) hours as needed.   zinc oxide 20 % ointment Apply 1 application topically as needed for irritation.   [DISCONTINUED] hydrALAZINE (APRESOLINE) 10 MG tablet Take 10 mg by mouth as needed.   No facility-administered encounter medications  on file as of 01/10/2022.    Allergies (verified) Simvastatin   History: Past Medical History:  Diagnosis Date   Cancer ([place])    skin   CKD (chronic kidney disease) 06/24/2013   Cochlear implant in place    Coronary artery disease    a. s/p CABG 2000; b. myoview 9/08: inf-septal scar with mild peri-infarct ischemia (low risk);  c.  Echo 8/13: mild focal basal septal hypertrophy, Gr 1 DD, mild LAE;  d. Lexiscan Myoview (06/20/2013): No reversible ischemia, inferior and septal infarct, inferior and septal hypokinesis, EF 60%;  e.  Echo (06/20/2011): EF 50-55%, normal wall motion, grade 1 diastolic dysfunction, mild LAE   Diverticular disease    Duodenitis    Elevated PSA    GERD (gastroesophageal reflux disease)    Gout    Humerus fracture    right   Hydronephrosis with ureteropelvic junction obstruction    Hypercalcemia 02/02/2012   a. 01/2012: 10.7.   Hyperlipidemia    Hypertension    Pancreatitis    a. ? Simvastatin related   Pneumonia age 26 or 7   "I think only once" (06/19/2013)   Syncope    a. 06/2013   TIA (transient ischemic attack)    a. 01/2012 - presentation as acute imbalance;  b. 01/2012 carotid U/S w/o signif stenosis;  c. 01/2012 Echo: EF 60-65%, Gr 1 DD.;  d.  Carotid US (06/20/2013): 1-39% bilateral ICA stenosis   Tubular adenoma polyp of rectum    Type II diabetes mellitus ([place])    "diet and exercise controlled at present" (06/19/2013)   Past Surgical History:  Procedure Laterality Date   APPENDECTOMY     CARDIAC CATHETERIZATION  2000   CHOLECYSTECTOMY     COCHLEAR IMPLANT Right 1990's   COLONOSCOPY W/ POLYPECTOMY     no polyp 2012   CORONARY ARTERY BYPASS GRAFT  2000   CABG X5   INGUINAL HERNIA REPAIR Right 09/15/2015   Procedure: OPEN REPAIR RIGHT INGUINAL HERNIA ;  Surgeon: Jackolyn Confer, MD;  Location: [facility];  Service: General;  Laterality: Right;   INSERTION OF MESH Right 09/15/2015   Procedure: INSERTION OF MESH;  Surgeon: Jackolyn Confer, MD;  Location: [facility];  Service: General;  Laterality: Right;   PARATHYROIDECTOMY  1978   POLYPECTOMY     REVERSE SHOULDER ARTHROPLASTY Right 12/30/2014   Procedure: Right shoulder ORIF, Biomet S3 Plate and ZImmer cable system ;  Surgeon: Netta Cedars, MD;  Location: [place];  Service: Orthopedics;  Laterality: Right;   right ankle surgery  age 61   TONSILLECTOMY  66   trachestomy as child for throat closing     Family History  Problem Relation Age of Onset   Diabetes Mother    Melanoma Father    Heart attack Father         in 81s   Social History   Socioeconomic History   Marital status: Widowed    Spouse name: Not on file  Number of children: 2   Years of education: college   Highest education level: Not on file  Occupational History   Occupation: retired    Fish farm manager: RETIRED  Tobacco Use   Smoking status: Former    Types: Cigars   Smokeless tobacco: Never   Tobacco comments:    06/19/2013 "rare cigar", 12/07/20 quit  Vaping Use   Vaping Use: Never used  Substance and Sexual Activity   Alcohol use: Yes    Comment: 12/07/20 1-2 a week   Drug use: No   Sexual activity: Not Currently    Birth control/protection: Injection  Other Topics Concern   Not on file  Social History Narrative   12/07/20 lives at  [place] area   Does get regular exercise   No caffeine   Social Determinants of Health   Financial Resource Strain: Low Risk  (01/10/2022)   Overall Financial Resource Strain (CARDIA)    Difficulty of Paying Living Expenses: Not hard at all  Food Insecurity: No Food Insecurity (01/10/2022)   Hunger Vital Sign    Worried About Running Out of Food in the Last Year: Never true    [place] in the Last Year: Never true  Transportation Needs: No Transportation Needs (01/10/2022)   PRAPARE - Hydrologist (Medical): No    Lack of Transportation (Non-Medical): No  Physical Activity: Insufficiently Active (01/10/2022)   Exercise Vital Sign     Days of Exercise per Week: 7 days    Minutes of Exercise per Session: 10 min  Stress: No Stress Concern Present (01/10/2022)   [place]    Feeling of Stress : Not at all  Social Connections: Socially Isolated (01/10/2022)   Social Connection and Isolation Panel [NHANES]    Frequency of Communication with Friends and Family: More than three times a week    Frequency of Social Gatherings with Friends and Family: More than three times a week    Attends Religious Services: Never    Marine scientist or Organizations: No    Attends Archivist Meetings: Never    Marital Status: Widowed    Tobacco Counseling Counseling given: Not Answered Tobacco comments: 06/19/2013 "rare cigar", 12/07/20 quit   Clinical Intake:  Pre-visit preparation completed: No  Pain : No/denies pain     BMI - recorded: 25.13 Nutritional Status: BMI 25 -29 Overweight Nutritional Risks: None Diabetes: Yes CBG done?: Yes CBG resulted in Enter/ Edit results?: No Did pt. bring in CBG monitor from home?: No  How often do you need to have someone help you when you read instructions, pamphlets, or other written materials from your doctor or pharmacy?: 3 - Sometimes What is the last grade level you completed in school?: College graduate  Diabetic?Yes  Interpreter Needed?: No      Activities of Daily Living    01/10/2022   12:47 PM  In your present state of health, do you have any difficulty performing the following activities:  Hearing? 1  Vision? 0  Difficulty concentrating or making decisions? 0  Walking or climbing stairs? 1  Dressing or bathing? 1  Doing errands, shopping? 1  Preparing Food and eating ? Y  Using the Toilet? Y  In the past six months, have you accidently leaked urine? Y  Do you have problems with loss of bowel control? N  Managing your Medications? Y  Managing your Finances? Darreld Mclean  Housekeeping or managing  your Housekeeping? Y    Patient Care Team: [name], NP as PCP - General (Adult Health Nurse Practitioner) [name], MD as PCP - Cardiology (Cardiology)  Indicate any recent Medical Services you may have received from other than [facility] providers in the past year (date may be approximate).     Assessment:   This is a routine wellness examination for Omer.  Hearing/Vision screen No results found.  Dietary issues and exercise activities discussed: Current Exercise Habits: The patient does not participate in regular exercise at present, Exercise limited by: neurologic condition(s);orthopedic condition(s)   Goals Addressed             This Visit's Progress    Maintain Mobility and Function   On track    Evidence-based guidance:  Emphasize the importance of physical activity and aerobic exercise as included in treatment plan; assess barriers to adherence; consider patient's abilities and preferences.  Encourage gradual increase in activity or exercise instead of stopping if pain occurs.  Reinforce individual therapy exercise prescription, such as strengthening, stabilization and stretching programs.  Promote optimal body mechanics to stabilize the spine with lifting and functional activity.  Encourage activity and mobility modifications to facilitate optimal function, such as using a log roll for bed mobility or dressing from a seated position.  Reinforce individual adaptive equipment recommendations to limit excessive spinal movements, such as a Systems analyst.  Assess adequacy of sleep; encourage use of sleep hygiene techniques, such as bedtime routine; use of white noise; dark, cool bedroom; avoiding daytime naps, heavy meals or exercise before bedtime.  Promote positions and modification to optimize sleep and sexual activity; consider pillows or positioning devices to assist in maintaining neutral spine.  Explore options for applying ergonomic principles at work and  home, such as frequent position changes, using ergonomically designed equipment and working at optimal height.  Promote modifications to increase comfort with driving such as lumbar support, optimizing seat and steering wheel position, using cruise control and taking frequent rest stops to stretch and walk.   Notes:        Depression Screen    01/10/2022   12:45 PM 01/09/2021   11:49 AM 01/19/2020    9:55 AM 01/15/2019    9:47 AM 03/18/2017    4:19 PM 03/05/2016    1:43 PM 09/20/2014    2:16 PM  PHQ 2/9 Scores  PHQ - 2 Score 0 0 0 0 0 0 0    Fall Risk    01/10/2022   12:47 PM 01/10/2022   10:24 AM 04/12/2021    1:19 PM 01/09/2021   11:54 AM 12/07/2020   10:56 AM  Fall Risk   Falls in the past year? 1 0 1  1  Number falls in past yr: 0 0 '1 1 1  '$ Comment     abrasions  Injury with Fall? 0 0 0 1 0  Risk for fall due to : History of fall(s);Impaired mobility No Fall Risks  History of fall(s);Impaired balance/gait;Impaired mobility;Mental status change   Follow up Falls evaluation completed;Education provided;Falls prevention discussed Falls evaluation completed  Falls evaluation completed;Education provided;Falls prevention discussed     FALL RISK PREVENTION PERTAINING TO THE HOME:  Any stairs in or around the home? No  If so, are there any without handrails? No  Home free of loose throw rugs in walkways, pet beds, electrical cords, etc? Yes  Adequate lighting in your home to reduce risk of falls? Yes  ASSISTIVE DEVICES UTILIZED TO PREVENT FALLS:  Life alert? No  Use of a cane, walker or w/c? Yes  Grab bars in the bathroom? Yes  Shower chair or bench in shower? Yes  Elevated toilet seat or a handicapped toilet? Yes   TIMED UP AND GO:  Was the test performed? No .  Length of time to ambulate 10 feet: N/A sec.   Gait slow and steady with assistive device  Cognitive Function:    01/10/2022   12:48 PM 04/12/2021    1:24 PM 01/09/2021   11:55 AM  MMSE - Mini Mental State Exam   Not completed:   Unable to complete  Orientation to time 4 5   Orientation to Place 5 4   Registration 3 3   Attention/ Calculation 5 2   Recall 3 2   Language- name 2 objects 2 2   Language- repeat 1 0   Language- follow 3 step command 3 2   Language- read & follow direction 1 1   Write a sentence 1 1   Copy design 1 0   Total score 29 22         01/10/2022   12:49 PM 01/09/2021   11:56 AM  6CIT Screen  What Year? 0 points 0 points  What month? 0 points 0 points  What time? 3 points 3 points  Count back from 20 2 points 2 points  Months in reverse 0 points 2 points  Repeat phrase 2 points 6 points  Total Score 7 points 13 points    Immunizations Immunization History  Administered Date(s) Administered   Fluad Quad(high Dose 65+) 04/01/2019   Influenza Split 04/13/2011, 05/02/2012   Influenza Whole 05/22/2007, 04/12/2008, 05/02/2009, 04/14/2010   Influenza, High Dose Seasonal PF 04/17/2013, 03/16/2014, 03/05/2016, 03/18/2017   Influenza,inj,Quad PF,6+ Mos 03/02/2015, 03/18/2018   Influenza-Unspecified 03/18/2018, 04/12/2021   Moderna SARS-COV2 Booster Vaccination 03/08/2021   Moderna Sars-Covid-2 Vaccination 06/22/2019, 07/20/2019, 05/02/2020, 11/23/2020   PPD Test 01/02/2015   Pneumococcal Conjugate-13 12/29/2015   Pneumococcal Polysaccharide-23 04/19/2006   Tdap 09/04/2016   Zoster Recombinat (Shingrix) 09/17/2019, 03/29/2020   Zoster, Live 06/29/2015    TDAP status: Up to date  Flu Vaccine status: Up to date  Pneumococcal vaccine status: Up to date  Covid-19 vaccine status: Completed vaccines  Qualifies for Shingles Vaccine? Yes   Zostavax completed Yes   Shingrix Completed?: Yes  Screening Tests Health Maintenance  Topic Date Due   URINE MICROALBUMIN  01/15/2020   COVID-19 Vaccine (5 - Moderna series) 05/09/2022 (Originally 05/03/2021)   INFLUENZA VACCINE  01/16/2022   HEMOGLOBIN A1C  06/28/2022   FOOT EXAM  07/28/2022   OPHTHALMOLOGY EXAM   12/01/2022   TETANUS/TDAP  09/05/2026   Pneumonia Vaccine 107+ Years old  Completed   Zoster Vaccines- Shingrix  Completed   HPV VACCINES  Aged Out    Health Maintenance  Health Maintenance Due  Topic Date Due   URINE MICROALBUMIN  01/15/2020    Colorectal cancer screening: No longer required.   Lung Cancer Screening: (Low Dose CT Chest recommended if Age 11-80 years, 30 pack-year currently smoking OR have quit w/in 15years.) does not qualify.   Lung Cancer Screening Referral: No  Additional Screening:  Hepatitis C Screening: does not qualify; Completed   Vision Screening: Recommended annual ophthalmology exams for early detection of glaucoma and other disorders of the eye. Is the patient up to date with their annual eye exam?  Yes  Who is the provider or  what is the name of the office in which the patient attends annual eye exams? Dr. Gershon Crane If pt is not established with a provider, would they like to be referred to a provider to establish care? No .   Dental Screening: Recommended annual dental exams for proper oral hygiene  Community Resource Referral / Chronic Care Management: CRR required this visit?  No   CCM required this visit?  No      Plan:     I have personally reviewed and noted the following in the patient's chart:   Medical and social history Use of alcohol, tobacco or illicit drugs  Current medications and supplements including opioid prescriptions. Patient is not currently taking opioid prescriptions. Functional ability and status Nutritional status Physical activity Advanced directives List of other physicians Hospitalizations, surgeries, and ER visits in previous 12 months Vitals Screenings to include cognitive, depression, and falls Referrals and appointments  In addition, I have reviewed and discussed with patient certain preventive protocols, quality metrics, and best practice recommendations. A written personalized care plan for preventive  services as well as general preventive health recommendations were provided to patient.     [name], NP   01/10/2022

## 2022-01-19 ENCOUNTER — Non-Acute Institutional Stay (SKILLED_NURSING_FACILITY): Payer: Federal, State, Local not specified - PPO | Admitting: Orthopedic Surgery

## 2022-01-19 ENCOUNTER — Encounter: Payer: Self-pay | Admitting: Orthopedic Surgery

## 2022-01-19 DIAGNOSIS — Z951 Presence of aortocoronary bypass graft: Secondary | ICD-10-CM

## 2022-01-19 DIAGNOSIS — E782 Mixed hyperlipidemia: Secondary | ICD-10-CM

## 2022-01-19 DIAGNOSIS — I10 Essential (primary) hypertension: Secondary | ICD-10-CM | POA: Diagnosis not present

## 2022-01-19 DIAGNOSIS — G459 Transient cerebral ischemic attack, unspecified: Secondary | ICD-10-CM

## 2022-01-19 DIAGNOSIS — Z8739 Personal history of other diseases of the musculoskeletal system and connective tissue: Secondary | ICD-10-CM

## 2022-01-19 DIAGNOSIS — R635 Abnormal weight gain: Secondary | ICD-10-CM

## 2022-01-19 DIAGNOSIS — R413 Other amnesia: Secondary | ICD-10-CM

## 2022-01-19 DIAGNOSIS — N138 Other obstructive and reflux uropathy: Secondary | ICD-10-CM

## 2022-01-19 DIAGNOSIS — N401 Enlarged prostate with lower urinary tract symptoms: Secondary | ICD-10-CM

## 2022-01-19 DIAGNOSIS — R2689 Other abnormalities of gait and mobility: Secondary | ICD-10-CM

## 2022-01-19 DIAGNOSIS — E119 Type 2 diabetes mellitus without complications: Secondary | ICD-10-CM

## 2022-01-19 NOTE — Progress Notes (Signed)
Location:   [facility] Room Number: 11 Place of Service:  SNF ([phone]) Provider:  Windell Moulding, NP  [name], NP  Patient Care Team: [name], NP as PCP - General (Adult Health Nurse Practitioner) [name], MD as PCP - Cardiology (Cardiology)  Extended Emergency Contact Information Primary Emergency Contact: [name],[name]          [place], [place] Phone: [phone] Mobile Phone: [phone] Relation: Daughter Secondary Emergency Contact: [name],[name] Address: [address]           [place], [place] [address] [name] of [place] Phone: [phone] Mobile Phone: [phone] Relation: Daughter  Code Status:  DNR Goals of care: Advanced Directive information    01/19/2022   10:40 AM  Advanced Directives  Does Patient Have a Medical Advance Directive? Yes  Type of Paramedic of [place];Living will;Out of facility DNR (pink MOST or yellow form)  Does patient want to make changes to medical advance directive? No - Patient declined  Copy of [place] in Chart? Yes - validated most recent copy scanned in chart (See row information)  Pre-existing out of facility DNR order (yellow form or pink MOST form) Yellow form placed in chart (order not valid for inpatient use)     Chief Complaint  Patient presents with   Medical Management of Chronic Issues    Routine follow up   Immunizations    Flu vaccine   Quality Metric Gaps    Urine microalbumin    HPI:  Pt is a 86 y.o. male seen today for medical management of chronic diseases.    He currently resides on the skilled nursing unit at [facility]. PMH: CAD, HTN, TIA, T2DM, HOH, OA, BPH, CKD, memory deficit, shuffling gait, and weight loss.   T2DM- A1c 6.4 12/26/2021, taken off metformin due to weight loss and stable A1c, remains on carb modified diet HxTIA- remains on plavix and fenofibrate H/o CABG- remains on  plavix HTN- BUN/creat 33/1.4 09/2021, remains on amlodipine HLD- LDL 75 10/2020, allergic to statins Memory deficit- BIMS score 15 01/03/2022, CT head 12/2020 atrophy and chronic microvascular ischemic changes noted, very pleasant, no behaviors Shuffling gait- continues to use wheelchair most of time, able to transfer on own Weight gain- see weight trends below, TSH 1.79 10/26/2021 Hypercalcemia- Calcium 10.4 (04/07)> 10.7 (02/20), PTH normal, TSH 1.79 10/26/2021, asymptomatic Hx of gout- uric acid 7.4 02/23, no recent flares, remains on allopurinol and colchicine prn BPH- denies retention, urinates 1-2x/night, remains on finasteride  No recent falls or injuries.   Recent blood pressures:  08/04- 115/73  08/03- 136/77, 113/73  08/02- 132/73, 131/93  Recent weights:  07/31- 169.6 lbs  07/03- 170.6 lbs  06/01- 171 lbs   Past Medical History:  Diagnosis Date   Cancer ([place])    skin   CKD (chronic kidney disease) 06/24/2013   Cochlear implant in place    Coronary artery disease    a. s/p CABG 2000; b. myoview 9/08: inf-septal scar with mild peri-infarct ischemia (low risk);  c.  Echo 8/13: mild focal basal septal hypertrophy, Gr 1 DD, mild LAE;  d. Lexiscan Myoview (06/20/2013): No reversible ischemia, inferior and septal infarct, inferior and septal hypokinesis, EF 60%;  e.  Echo (06/20/2011): EF 50-55%, normal wall motion, grade 1 diastolic dysfunction, mild LAE   Diverticular disease    Duodenitis    Elevated PSA    GERD (gastroesophageal  reflux disease)    Gout    Humerus fracture    right   Hydronephrosis with ureteropelvic junction obstruction    Hypercalcemia 02/02/2012   a. 01/2012: 10.7.   Hyperlipidemia    Hypertension    Pancreatitis    a. ? Simvastatin related   Pneumonia age 13 or 7   "I think only once" (06/19/2013)   Syncope    a. 06/2013   TIA (transient ischemic attack)    a. 01/2012 - presentation as acute imbalance;  b. 01/2012 carotid U/S w/o signif stenosis;  c.  01/2012 Echo: EF 60-65%, Gr 1 DD.;  d.  Carotid US (06/20/2013): 1-39% bilateral ICA stenosis   Tubular adenoma polyp of rectum    Type II diabetes mellitus ([place])    "diet and exercise controlled at present" (06/19/2013)   Past Surgical History:  Procedure Laterality Date   APPENDECTOMY     CARDIAC CATHETERIZATION  2000   CHOLECYSTECTOMY     COCHLEAR IMPLANT Right 1990's   COLONOSCOPY W/ POLYPECTOMY     no polyp 2012   CORONARY ARTERY BYPASS GRAFT  2000   CABG X5   INGUINAL HERNIA REPAIR Right 09/15/2015   Procedure: OPEN REPAIR RIGHT INGUINAL HERNIA ;  Surgeon: Jackolyn Confer, MD;  Location: [facility];  Service: General;  Laterality: Right;   INSERTION OF MESH Right 09/15/2015   Procedure: INSERTION OF MESH;  Surgeon: Jackolyn Confer, MD;  Location: [facility];  Service: General;  Laterality: Right;   PARATHYROIDECTOMY  1978   POLYPECTOMY     REVERSE SHOULDER ARTHROPLASTY Right 12/30/2014   Procedure: Right shoulder ORIF, Biomet S3 Plate and ZImmer cable system ;  Surgeon: Netta Cedars, MD;  Location: [facility];  Service: Orthopedics;  Laterality: Right;   right ankle surgery  age 38   TONSILLECTOMY  65   trachestomy as child for throat closing      Allergies  Allergen Reactions   Simvastatin Other (See Comments)    Possible statin-induced pancreatitis    Allergies as of 01/19/2022       Reactions   Simvastatin Other (See Comments)   Possible statin-induced pancreatitis        Medication List        Accurate as of January 19, 2022 10:46 AM. If you have any questions, ask your nurse or doctor.          acetaminophen 500 MG tablet Commonly known as: TYLENOL Take 1,000 mg by mouth 2 (two) times daily as needed for mild pain.   allopurinol 100 MG tablet Commonly known as: ZYLOPRIM Take 100 mg by mouth daily.   amLODipine 2.5 MG tablet Commonly known as: NORVASC Take 1 tablet (2.5 mg total) by mouth daily.   Cholecalciferol 50 MCG (2000 UT) Tabs Take 2,000 Units by mouth in  the morning.   clopidogrel 75 MG tablet Commonly known as: PLAVIX Take 75 mg by mouth daily.   colchicine 0.6 MG tablet Take 0.6 mg by mouth daily as needed (as directed for gout flares).   Fenofibric Acid 135 MG Cpdr Take 1 tablet by mouth in the morning.   finasteride 5 MG tablet Commonly known as: PROSCAR Take 5 mg by mouth every evening.   ipratropium-albuterol 0.5-2.5 (3) MG/3ML Soln Commonly known as: DUONEB Take 3 mLs by nebulization every 6 (six) hours as needed.   zinc oxide 20 % ointment Apply 1 application topically as needed for irritation.        Review of Systems  Constitutional:  Negative for activity change, appetite change, chills, fatigue and fever.  HENT:  Positive for hearing loss. Negative for congestion and trouble swallowing.   Eyes:  Negative for visual disturbance.  Respiratory:  Negative for cough, shortness of breath and wheezing.   Cardiovascular:  Negative for chest pain and leg swelling.  Gastrointestinal:  Negative for abdominal distention, abdominal pain, constipation, diarrhea, nausea and vomiting.  Genitourinary:  Negative for dysuria, frequency and hematuria.  Musculoskeletal:  Positive for gait problem.  Skin:  Negative for wound.  Neurological:  Positive for weakness. Negative for dizziness and headaches.  Psychiatric/Behavioral:  Negative for confusion, dysphoric mood and sleep disturbance. The patient is not nervous/anxious.     Immunization History  Administered Date(s) Administered   Fluad Quad(high Dose 65+) 04/01/2019   Influenza Split 04/13/2011, 05/02/2012   Influenza Whole 05/22/2007, 04/12/2008, 05/02/2009, 04/14/2010   Influenza, High Dose Seasonal PF 04/17/2013, 03/16/2014, 03/05/2016, 03/18/2017   Influenza,inj,Quad PF,6+ Mos 03/02/2015, 03/18/2018   Influenza-Unspecified 03/18/2018, 04/12/2021   Moderna SARS-COV2 Booster Vaccination 03/08/2021   Moderna Sars-Covid-2 Vaccination 06/22/2019, 07/20/2019, 05/02/2020,  11/23/2020   PPD Test 01/02/2015   Pneumococcal Conjugate-13 12/29/2015   Pneumococcal Polysaccharide-23 04/19/2006   Tdap 09/04/2016   Zoster Recombinat (Shingrix) 09/17/2019, 03/29/2020   Zoster, Live 06/29/2015   Pertinent  Health Maintenance Due  Topic Date Due   URINE MICROALBUMIN  01/15/2020   INFLUENZA VACCINE  01/16/2022   HEMOGLOBIN A1C  06/28/2022   FOOT EXAM  07/28/2022   OPHTHALMOLOGY EXAM  12/01/2022      12/24/2020   11:43 AM 01/09/2021   11:54 AM 04/12/2021    1:19 PM 01/10/2022   10:24 AM 01/10/2022   12:47 PM  Fall Risk  Falls in the past year?   1 0 1  Was there an injury with Fall?  1 0 0 0  Fall Risk Category Calculator   2 0 1  Fall Risk Category   Moderate Low Low  Patient Fall Risk Level High fall risk High fall risk  Low fall risk High fall risk  Patient at Risk for Falls Due to  History of fall(s);Impaired balance/gait;Impaired mobility;Mental status change  No Fall Risks History of fall(s);Impaired mobility  Fall risk Follow up  Falls evaluation completed;Education provided;Falls prevention discussed  Falls evaluation completed Falls evaluation completed;Education provided;Falls prevention discussed   Functional Status Survey:    Vitals:   01/19/22 1037  BP: 136/77  Pulse: 76  Resp: 16  Temp: (!) 96.3 F (35.7 C)  SpO2: 95%  Weight: 162 lb (73.5 kg)  Height: '5\' 9"'$  (1.753 m)   Body mass index is 23.92 kg/m. Physical Exam Vitals reviewed.  Constitutional:      General: He is not in acute distress. HENT:     Head: Normocephalic.     Right Ear: There is no impacted cerumen.     Left Ear: There is no impacted cerumen.     Ears:     Comments: Right cochlear implant    Nose: Nose normal.     Mouth/Throat:     Mouth: Mucous membranes are moist.  Eyes:     General:        Right eye: No discharge.        Left eye: No discharge.  Cardiovascular:     Rate and Rhythm: Normal rate and regular rhythm.     Pulses: Normal pulses.     Heart  sounds: Normal heart sounds.  Pulmonary:     Effort:  Pulmonary effort is normal. No respiratory distress.     Breath sounds: Normal breath sounds. No wheezing.  Abdominal:     General: Bowel sounds are normal. There is no distension.     Palpations: Abdomen is soft.     Tenderness: There is no abdominal tenderness.  Musculoskeletal:     Cervical back: Neck supple.     Right lower leg: No edema.     Left lower leg: No edema.  Skin:    General: Skin is warm and dry.     Capillary Refill: Capillary refill takes less than 2 seconds.  Neurological:     General: No focal deficit present.     Mental Status: He is alert and oriented to person, place, and time.     Motor: Weakness present.     Gait: Gait abnormal.     Comments: wheelchair  Psychiatric:        Mood and Affect: Mood normal.        Behavior: Behavior normal.     Labs reviewed: Recent Labs    08/10/21 0000 08/11/21 0000 09/22/21 0000  NA 141 141 139  K 4.1 4.1 3.9  CL 108 108 106  CO2 29* 29* 26*  BUN 33* 33* 33*  CREATININE 1.2 1.2 1.4*  CALCIUM 10.7 10.7 10.4   Recent Labs    07/31/21 0000 08/10/21 0000 08/11/21 0000  AST '16 17 17  '$ ALT 8* 12 12  ALKPHOS 38 36 36  ALBUMIN 4.1 4.4 4.4   Recent Labs    07/31/21 0000  WBC 4.6  NEUTROABS 2,433.00  HGB 13.6  HCT 41  PLT 239   Lab Results  Component Value Date   TSH 1.79 10/26/2021   Lab Results  Component Value Date   HGBA1C 6.4 12/26/2021   Lab Results  Component Value Date   CHOL 188 09/22/2021   HDL 41 09/22/2021   LDLCALC 119 09/22/2021   LDLDIRECT 96.0 08/23/2020   TRIG 163 (A) 09/22/2021   CHOLHDL 4.4 10/21/2020    Significant Diagnostic Results in last 30 days:  No results found.  Assessment/Plan 1. Diabetes mellitus without complication (HCC) - Z5G stable - no hypoglycemias - off medication - diabetic eye exam completed  2. TIA (transient ischemic attack) - cont Plavix and fenofibrate  3. Hx of CABG - followed by  cardiology - cont Plavix  4. Essential hypertension, benign - controlled with amlodipine  5. Mixed hyperlipidemia - statin allergy - cont low fat diet - cont fenofibrate  6. Memory deficit - BIMS 15 12/2021 - cont skilled nursing  7. Shuffling gait - no recent falls - cont wheelchair  8. Weight gain - no recent weight gain - cont monthly weights  9. Hypercalcemia - calcium stable - on further workup  10. History of gout - no recent flares - cont allopurinol and colchicine  11. Benign prostatic hyperplasia with urinary obstruction - cont finasteride   Family/ staff Communication: plan discussed with patient and nurse  Labs/tests ordered: none

## 2022-01-31 NOTE — Progress Notes (Signed)
Patient ID: Juan [name], male   DOB: Mar 05, 1936, 86 y.o.   MRN: [id number]     Juan [name] is a 86 y.o.  male with a hx of CAD, s/p CABG in 2000, HTN, HL, deafness s/p cochlear implant. Last myoview 2015: inf-septal scar with mild peri-infarct ischemia (low risk).  [facility] Wife died 44 years ago  Daughter Electrical engineer with him today She is a Transport planner D in [place] Daughter [name] in [place] works at [employer]   No angina compliant with meds History of statin induced pancreatitis LDL above goal at 119 on 09/22/2021 Given age and DNR would not start Zetia  On plavix for presumed stroke that affected his walking Cannot have MRI due to cochlear implant  ROS: Denies fever, malais, weight loss, blurry vision, decreased visual acuity, cough, sputum, SOB, hemoptysis, pleuritic pain, palpitaitons, heartburn, abdominal pain, melena, lower extremity edema, claudication, or rash.  All other systems reviewed and negative  General: Affect appropriate Healthy:  appears stated age 59: choclear implant  Neck supple with no adenopathy JVP normal no bruits no thyromegaly Lungs clear with no wheezing and good diaphragmatic motion Heart:  S1/S2 SEM  murmur, no rub, gallop or click PMI normal post sternotomy  Abdomen: benighn, BS positve, no tenderness, no AAA no bruit.  No HSM or HJR Distal pulses intact with no bruits No edema Neuro non-focal Skin warm and dry Post humeral fracture    Current Outpatient Medications  Medication Sig Dispense Refill   acetaminophen (TYLENOL) 500 MG tablet Take 1,000 mg by mouth 2 (two) times daily as needed for mild pain.     allopurinol (ZYLOPRIM) 100 MG tablet Take 100 mg by mouth daily.     amLODipine (NORVASC) 2.5 MG tablet Take 1 tablet (2.5 mg total) by mouth daily. 90 tablet 0   Cholecalciferol 50 MCG (2000 UT) TABS Take 2,000 Units by mouth in the morning.     Choline Fenofibrate (FENOFIBRIC ACID) 135 MG CPDR Take 1 tablet by mouth in the morning.     clopidogrel  (PLAVIX) 75 MG tablet Take 75 mg by mouth daily.     colchicine 0.6 MG tablet Take 0.6 mg by mouth daily as needed (as directed for gout flares).     finasteride (PROSCAR) 5 MG tablet Take 5 mg by mouth every evening.     ipratropium-albuterol (DUONEB) 0.5-2.5 (3) MG/3ML SOLN Take 3 mLs by nebulization every 6 (six) hours as needed.     zinc oxide 20 % ointment Apply 1 application topically as needed for irritation.     No current facility-administered medications for this visit.    Allergies  Simvastatin  Electrocardiogram:02/02/2022 NSR normal ECG 02/02/2022 SR LAD poor R wave progresion    Assessment and Plan  CAD/CABG:  Distant 2000 no angina continue medical Rx Myovue 1/15 with old IMI no ischemia EF normal Given Age and lack of chest pain observe   Chol:  History of statin induced pancreatitis LDL 119 on 09/22/21   Prostate:  F/U PSA Dr Braulio Bosch continue Proscar  Neuro:  He shuffles quit a bit with gait will defer to primary if neuro w/u needed for Parkinson's   DM:  Discussed low carb diet.  Target hemoglobin A1c is 6.5 or less.  Continue current medications.  TIA:  distant lacunar On plavix per neurology   Advanced Directives:  discussed DNR in place   F/U in a year    Jenkins Rouge

## 2022-02-02 ENCOUNTER — Encounter: Payer: Self-pay | Admitting: Cardiovascular Disease

## 2022-02-02 ENCOUNTER — Ambulatory Visit: Payer: Federal, State, Local not specified - PPO | Admitting: Cardiovascular Disease

## 2022-02-02 VITALS — BP 128/68 | HR 87 | Ht 68.0 in | Wt 171.0 lb

## 2022-02-02 DIAGNOSIS — Z951 Presence of aortocoronary bypass graft: Secondary | ICD-10-CM | POA: Diagnosis not present

## 2022-02-02 DIAGNOSIS — E782 Mixed hyperlipidemia: Secondary | ICD-10-CM

## 2022-02-02 NOTE — Patient Instructions (Signed)
Medication Instructions:  Your physician recommends that you continue on your current medications as directed. Please refer to the Current Medication list given to you today.  *If you need a refill on your cardiac medications before your next appointment, please call your pharmacy*  Lab Work: If you have labs (blood work) drawn today and your tests are completely normal, you will receive your results only by: MyChart Message (if you have MyChart) OR A paper copy in the mail If you have any lab test that is abnormal or we need to change your treatment, we will call you to review the results.  Testing/Procedures: None ordered today.  Follow-Up: At [facility], you and your health needs are our priority.  As part of our continuing mission to provide you with exceptional heart care, we have created designated Provider Care Teams.  These Care Teams include your primary Cardiologist (physician) and Advanced Practice Providers (APPs -  Physician Assistants and Nurse Practitioners) who all work together to provide you with the care you need, when you need it.  We recommend signing up for the patient portal called "MyChart".  Sign up information is provided on this After Visit Summary.  MyChart is used to connect with patients for Virtual Visits (Telemedicine).  Patients are able to view lab/test results, encounter notes, upcoming appointments, etc.  Non-urgent messages can be sent to your provider as well.   To learn more about what you can do with MyChart, go to [email or web address].    Your next appointment:   12 month(s)  The format for your next appointment:   In Person  Provider:   Peter Nishan, MD {   Important Information About Sugar       

## 2022-02-21 ENCOUNTER — Encounter: Payer: Self-pay | Admitting: Orthopedic Surgery

## 2022-02-21 ENCOUNTER — Non-Acute Institutional Stay (SKILLED_NURSING_FACILITY): Payer: Federal, State, Local not specified - PPO | Admitting: Orthopedic Surgery

## 2022-02-21 DIAGNOSIS — E782 Mixed hyperlipidemia: Secondary | ICD-10-CM

## 2022-02-21 DIAGNOSIS — R635 Abnormal weight gain: Secondary | ICD-10-CM

## 2022-02-21 DIAGNOSIS — I10 Essential (primary) hypertension: Secondary | ICD-10-CM

## 2022-02-21 DIAGNOSIS — Z951 Presence of aortocoronary bypass graft: Secondary | ICD-10-CM | POA: Diagnosis not present

## 2022-02-21 DIAGNOSIS — R2689 Other abnormalities of gait and mobility: Secondary | ICD-10-CM

## 2022-02-21 DIAGNOSIS — Z8739 Personal history of other diseases of the musculoskeletal system and connective tissue: Secondary | ICD-10-CM

## 2022-02-21 DIAGNOSIS — R413 Other amnesia: Secondary | ICD-10-CM

## 2022-02-21 DIAGNOSIS — E119 Type 2 diabetes mellitus without complications: Secondary | ICD-10-CM | POA: Diagnosis not present

## 2022-02-21 DIAGNOSIS — N401 Enlarged prostate with lower urinary tract symptoms: Secondary | ICD-10-CM

## 2022-02-21 DIAGNOSIS — N138 Other obstructive and reflux uropathy: Secondary | ICD-10-CM

## 2022-02-21 DIAGNOSIS — G459 Transient cerebral ischemic attack, unspecified: Secondary | ICD-10-CM

## 2022-02-21 NOTE — Progress Notes (Signed)
Location:  [facility] Room Number: 11/A Place of Service:  SNF (31) Provider:  [name], NP   [name], NP  Patient Care Team: [name], NP as PCP - General (Adult Health Nurse Practitioner) [name], MD as PCP - Cardiology (Cardiology)  Extended Emergency Contact Information Primary Emergency Contact: [place], [place] of [place] Phone: [phone] Mobile Phone: [phone] Relation: Daughter Secondary Emergency Contact: [name],[name] Address: [address]           [place], [place] [address] [name] of [place] Phone: [phone] Mobile Phone: [phone] Relation: Daughter  Code Status:  DNR Goals of care: Advanced Directive information    01/19/2022   10:40 AM  Advanced Directives  Does Patient Have a Medical Advance Directive? Yes  Type of Paramedic of [place];Living will;Out of facility DNR (pink MOST or yellow form)  Does patient want to make changes to medical advance directive? No - Patient declined  Copy of [place] in Chart? Yes - validated most recent copy scanned in chart (See row information)  Pre-existing out of facility DNR order (yellow form or pink MOST form) Yellow form placed in chart (order not valid for inpatient use)     Chief Complaint  Patient presents with   Medical Management of Chronic Issues    HPI:  Pt is a 86 y.o. male seen today for medical management of chronic diseases.    He currently resides on the skilled nursing unit at [facility]. PMH: CAD, HTN, TIA (hospitalized 05/05- 10/24/2020), T2DM, HOH, OA, BPH, CKD, memory deficit, shuffling gait, and weight loss.   T2DM- A1c 6.4 12/26/2021, taken off metformin due to weight loss and stable A1c, last eye exam 11/30/2021, remains on carb modified diet HxTIA- no further workup per neurology, remains on plavix and fenofibrate H/o CABG- evaluated by  cardiology 01/2022- no new orders/cont 1 year f/u, 10/2020 LVEF 60-65%, remains on plavix HTN- BUN/creat 33/1.4 09/2021, remains on amlodipine HLD- LDL 75 10/2020, allergic to statins Memory deficit- BIMS score 15 01/03/2022, CT head 12/2020 atrophy and chronic microvascular ischemic changes noted, very pleasant, no behaviors Shuffling gait- continues to use wheelchair most of time, able to transfer on own Weight gain- see weight trends below, TSH 1.79 10/26/2021 Hypercalcemia- calcium 10.4 (04/07)> 10.7 (02/20), PTH normal, TSH 1.79 10/26/2021, asymptomatic Hx of gout- uric acid 7.4 02/23, no recent flares, remains on allopurinol and colchicine prn BPH- denies retention, urinates 1-2x/night, remains on finasteride  No recent falls or injuries.   Recent blood pressures:  09/06- 130/70  09/05- 143/84, 134/77  09/04- 121/79, 123/79  Recent weights:  09/04- 168.5 lbs  07/31- 169.6 lbs  07/03- 170.6 lbs     Past Medical History:  Diagnosis Date   Cancer ([place])    skin   CKD (chronic kidney disease) 06/24/2013   Cochlear implant in place    Coronary artery disease    a. s/p CABG 2000; b. myoview 9/08: inf-septal scar with mild peri-infarct ischemia (low risk);  c.  Echo 8/13: mild focal basal septal hypertrophy, Gr 1 DD, mild LAE;  d. Lexiscan Myoview (06/20/2013): No reversible ischemia, inferior and septal infarct, inferior and septal hypokinesis, EF 60%;  e.  Echo (06/20/2011): EF 50-55%, normal wall motion, grade 1 diastolic dysfunction, mild LAE   Diverticular disease    Duodenitis    Elevated PSA  GERD (gastroesophageal reflux disease)    Gout    Humerus fracture    right   Hydronephrosis with ureteropelvic junction obstruction    Hypercalcemia 02/02/2012   a. 01/2012: 10.7.   Hyperlipidemia    Hypertension    Pancreatitis    a. ? Simvastatin related   Pneumonia age 89 or 7   "I think only once" (06/19/2013)   Syncope    a. 06/2013   TIA (transient ischemic attack)    a.  01/2012 - presentation as acute imbalance;  b. 01/2012 carotid U/S w/o signif stenosis;  c. 01/2012 Echo: EF 60-65%, Gr 1 DD.;  d.  Carotid US (06/20/2013): 1-39% bilateral ICA stenosis   Tubular adenoma polyp of rectum    Type II diabetes mellitus ([place])    "diet and exercise controlled at present" (06/19/2013)   Past Surgical History:  Procedure Laterality Date   APPENDECTOMY     CARDIAC CATHETERIZATION  2000   CHOLECYSTECTOMY     COCHLEAR IMPLANT Right 1990's   COLONOSCOPY W/ POLYPECTOMY     no polyp 2012   CORONARY ARTERY BYPASS GRAFT  2000   CABG X5   INGUINAL HERNIA REPAIR Right 09/15/2015   Procedure: OPEN REPAIR RIGHT INGUINAL HERNIA ;  Surgeon: Jackolyn Confer, MD;  Location: [facility];  Service: General;  Laterality: Right;   INSERTION OF MESH Right 09/15/2015   Procedure: INSERTION OF MESH;  Surgeon: Jackolyn Confer, MD;  Location: [facility];  Service: General;  Laterality: Right;   PARATHYROIDECTOMY  1978   POLYPECTOMY     REVERSE SHOULDER ARTHROPLASTY Right 12/30/2014   Procedure: Right shoulder ORIF, Biomet S3 Plate and ZImmer cable system ;  Surgeon: Netta Cedars, MD;  Location: [facility];  Service: Orthopedics;  Laterality: Right;   right ankle surgery  age 6   TONSILLECTOMY  38   trachestomy as child for throat closing      Allergies  Allergen Reactions   Simvastatin Other (See Comments)    Possible statin-induced pancreatitis    Outpatient Encounter Medications as of 02/21/2022  Medication Sig   acetaminophen (TYLENOL) 500 MG tablet Take 1,000 mg by mouth 2 (two) times daily as needed for mild pain.   allopurinol (ZYLOPRIM) 100 MG tablet Take 100 mg by mouth daily.   amLODipine (NORVASC) 2.5 MG tablet Take 1 tablet (2.5 mg total) by mouth daily.   Cholecalciferol 50 MCG (2000 UT) TABS Take 2,000 Units by mouth in the morning.   Choline Fenofibrate (FENOFIBRIC ACID) 135 MG CPDR Take 1 tablet by mouth in the morning.   clopidogrel (PLAVIX) 75 MG tablet Take 75 mg by mouth daily.    colchicine 0.6 MG tablet Take 0.6 mg by mouth daily as needed (as directed for gout flares).   finasteride (PROSCAR) 5 MG tablet Take 5 mg by mouth every evening.   ipratropium-albuterol (DUONEB) 0.5-2.5 (3) MG/3ML SOLN Take 3 mLs by nebulization every 6 (six) hours as needed.   zinc oxide 20 % ointment Apply 1 application topically as needed for irritation.   No facility-administered encounter medications on file as of 02/21/2022.    Review of Systems  Constitutional:  Negative for activity change, appetite change, fatigue and fever.  HENT:  Positive for hearing loss. Negative for congestion and trouble swallowing.   Eyes:  Negative for visual disturbance.  Respiratory:  Negative for cough, shortness of breath and wheezing.   Cardiovascular:  Negative for chest pain and leg swelling.  Gastrointestinal:  Negative for abdominal distention,  abdominal pain, constipation, diarrhea, nausea and vomiting.  Genitourinary:  Negative for dysuria, frequency and hematuria.  Musculoskeletal:  Positive for gait problem.  Skin:  Negative for wound.  Neurological:  Positive for weakness. Negative for dizziness and headaches.  Psychiatric/Behavioral:  Positive for confusion. Negative for dysphoric mood and sleep disturbance. The patient is not nervous/anxious.     Immunization History  Administered Date(s) Administered   Fluad Quad(high Dose 65+) 04/01/2019   Influenza Split 04/13/2011, 05/02/2012   Influenza Whole 05/22/2007, 04/12/2008, 05/02/2009, 04/14/2010   Influenza, High Dose Seasonal PF 04/17/2013, 03/16/2014, 03/05/2016, 03/18/2017   Influenza,inj,Quad PF,6+ Mos 03/02/2015, 03/18/2018   Influenza-Unspecified 03/18/2018, 04/12/2021   Moderna SARS-COV2 Booster Vaccination 03/08/2021   Moderna Sars-Covid-2 Vaccination 06/22/2019, 07/20/2019, 05/02/2020, 11/23/2020   PPD Test 01/02/2015   Pneumococcal Conjugate-13 12/29/2015   Pneumococcal Polysaccharide-23 04/19/2006   Tdap 09/04/2016    Zoster Recombinat (Shingrix) 09/17/2019, 03/29/2020   Zoster, Live 06/29/2015   Pertinent  Health Maintenance Due  Topic Date Due   URINE MICROALBUMIN  01/15/2020   INFLUENZA VACCINE  01/16/2022   HEMOGLOBIN A1C  06/28/2022   FOOT EXAM  07/28/2022   OPHTHALMOLOGY EXAM  12/01/2022      12/24/2020   11:43 AM 01/09/2021   11:54 AM 04/12/2021    1:19 PM 01/10/2022   10:24 AM 01/10/2022   12:47 PM  Fall Risk  Falls in the past year?   1 0 1  Was there an injury with Fall?  1 0 0 0  Fall Risk Category Calculator   2 0 1  Fall Risk Category   Moderate Low Low  Patient Fall Risk Level High fall risk High fall risk  Low fall risk High fall risk  Patient at Risk for Falls Due to  History of fall(s);Impaired balance/gait;Impaired mobility;Mental status change  No Fall Risks History of fall(s);Impaired mobility  Fall risk Follow up  Falls evaluation completed;Education provided;Falls prevention discussed  Falls evaluation completed Falls evaluation completed;Education provided;Falls prevention discussed   Functional Status Survey:    Vitals:   02/21/22 1136  BP: 130/70  Pulse: 80  Resp: 20  Temp: (!) 97.1 F (36.2 C)  SpO2: 90%  Weight: 168 lb 8 oz (76.4 kg)  Height: '5\' 9"'$  (1.753 m)   Body mass index is 24.88 kg/m. Physical Exam Vitals reviewed.  Constitutional:      General: He is not in acute distress. HENT:     Head: Normocephalic.     Right Ear: There is no impacted cerumen.     Left Ear: There is no impacted cerumen.     Ears:     Comments: Right cochlear implant    Nose: Nose normal.     Mouth/Throat:     Mouth: Mucous membranes are moist.  Eyes:     General:        Right eye: No discharge.        Left eye: No discharge.  Cardiovascular:     Rate and Rhythm: Normal rate and regular rhythm.     Pulses: Normal pulses.     Heart sounds: Normal heart sounds.  Pulmonary:     Effort: Pulmonary effort is normal. No respiratory distress.     Breath sounds: Normal breath  sounds. No wheezing.  Abdominal:     General: Bowel sounds are normal. There is no distension.     Palpations: Abdomen is soft.     Tenderness: There is no abdominal tenderness.  Musculoskeletal:     Cervical  back: Neck supple.     Right lower leg: No edema.     Left lower leg: No edema.  Skin:    General: Skin is warm and dry.     Capillary Refill: Capillary refill takes less than 2 seconds.  Neurological:     General: No focal deficit present.     Mental Status: He is alert and oriented to person, place, and time.     Motor: Weakness present.     Gait: Gait abnormal.     Comments: wheelchair  Psychiatric:        Mood and Affect: Mood normal.        Behavior: Behavior normal.     Labs reviewed: Recent Labs    08/10/21 0000 08/11/21 0000 09/22/21 0000  NA 141 141 139  K 4.1 4.1 3.9  CL 108 108 106  CO2 29* 29* 26*  BUN 33* 33* 33*  CREATININE 1.2 1.2 1.4*  CALCIUM 10.7 10.7 10.4   Recent Labs    07/31/21 0000 08/10/21 0000 08/11/21 0000  AST '16 17 17  '$ ALT 8* 12 12  ALKPHOS 38 36 36  ALBUMIN 4.1 4.4 4.4   Recent Labs    07/31/21 0000  WBC 4.6  NEUTROABS 2,433.00  HGB 13.6  HCT 41  PLT 239   Lab Results  Component Value Date   TSH 1.79 10/26/2021   Lab Results  Component Value Date   HGBA1C 6.4 12/26/2021   Lab Results  Component Value Date   CHOL 188 09/22/2021   HDL 41 09/22/2021   LDLCALC 119 09/22/2021   LDLDIRECT 96.0 08/23/2020   TRIG 163 (A) 09/22/2021   CHOLHDL 4.4 10/21/2020    Significant Diagnostic Results in last 30 days:  No results found.  Assessment/Plan 1. Diabetes mellitus without complication (HCC) - U1L 6.4 (7/10) > was 6.4 (05/11) - last eye exam 11/30/2021 - no hypoglycemias - off medication - urine microalbumin  2. TIA (transient ischemic attack) - no further workup per neurology - cont Plavix - has statin allergy  3. Hx of CABG - followed by cardiology - cont Plavix and fenofibrate  4. Essential  hypertension, benign - controlled with amlodipine  5. Mixed hyperlipidemia - allergy to statins - cont fenofibrate  6. Memory deficit - BIMS 15/15 12/2021 - no behaviors - doing well in SNF  7. Shuffling gait - ambulates with wheelchair - cont skilled nursing care  8. Weight gain - weights stable - cont monthly weights  9. Hypercalcemia - asymptomatic - PTH normal  10. History of gout - no recent episodes - cont allopurinol and colchicine  11. Benign prostatic hyperplasia with urinary obstruction - cont finasteride   Family/ staff Communication: plan discussed with patient and nurse  Labs/tests ordered:  urine microalbumin

## 2022-03-01 ENCOUNTER — Telehealth: Payer: Self-pay | Admitting: Internal Medicine

## 2022-03-01 NOTE — Telephone Encounter (Signed)
Patient had cough and sneezing Tested positive for covid Family wants him on Paxlovid Order written

## 2022-03-29 ENCOUNTER — Encounter: Payer: Self-pay | Admitting: Internal Medicine

## 2022-03-29 ENCOUNTER — Non-Acute Institutional Stay (SKILLED_NURSING_FACILITY): Payer: Federal, State, Local not specified - PPO | Admitting: Internal Medicine

## 2022-03-29 DIAGNOSIS — Z8739 Personal history of other diseases of the musculoskeletal system and connective tissue: Secondary | ICD-10-CM

## 2022-03-29 DIAGNOSIS — E119 Type 2 diabetes mellitus without complications: Secondary | ICD-10-CM | POA: Diagnosis not present

## 2022-03-29 DIAGNOSIS — G459 Transient cerebral ischemic attack, unspecified: Secondary | ICD-10-CM | POA: Diagnosis not present

## 2022-03-29 DIAGNOSIS — N138 Other obstructive and reflux uropathy: Secondary | ICD-10-CM

## 2022-03-29 DIAGNOSIS — R2689 Other abnormalities of gait and mobility: Secondary | ICD-10-CM | POA: Diagnosis not present

## 2022-03-29 DIAGNOSIS — I10 Essential (primary) hypertension: Secondary | ICD-10-CM | POA: Diagnosis not present

## 2022-03-29 DIAGNOSIS — Z951 Presence of aortocoronary bypass graft: Secondary | ICD-10-CM

## 2022-03-29 DIAGNOSIS — N401 Enlarged prostate with lower urinary tract symptoms: Secondary | ICD-10-CM

## 2022-03-29 DIAGNOSIS — E782 Mixed hyperlipidemia: Secondary | ICD-10-CM

## 2022-03-29 NOTE — Progress Notes (Signed)
Location:   [facility] Room Number: 11/A Place of Service:  SNF ([phone]) [name], Juan Gay, NP  Patient Care Team: [name], NP as PCP - General (Adult Health Nurse Practitioner) [name], MD as PCP - Cardiology (Cardiology)  Extended Emergency Contact Information Primary Emergency Contact: [name], [name] of [place] Phone: [phone] Mobile Phone: [phone] Relation: Daughter Secondary Emergency Contact: [name],[name] Address: [address]           [place], [place] [address] [name] of [place] Phone: [phone] Mobile Phone: [phone] Relation: Daughter  Code Status: DNR  Goals of care: Advanced Directive information    03/29/2022    1:31 PM  Advanced Directives  Does Patient Have a Medical Advance Directive? Yes  Type of Paramedic of [place];Living will;Out of facility DNR (pink MOST or yellow form)  Does patient want to make changes to medical advance directive? No - Patient declined  Copy of [place] in Chart? Yes - validated most recent copy scanned in chart (See row information)  Pre-existing out of facility DNR order (yellow form or pink MOST form) Yellow form placed in chart (order not valid for inpatient use)     Chief Complaint  Patient presents with   Medical Management of Chronic Issues    Routine    HPI:  Pt is a 86 y.o. male seen today for medical management of chronic diseases.    Lives in SNF Patient has a history of CAD, S/P CABG in 2000, gout, HLD, hypertension and diabetes Patient was admitted in the hospital from 5/22-for Possible CVA Cannot do MRI due to Auditory implant    He is stable. No new Nursing issues. No Behavior issues His weight is stable Does not walk Just stays in his wheelchair  No Falls Wt Readings from Last 3 Encounters:  03/29/22 172 lb (78  kg)  02/21/22 168 lb 8 oz (76.4 kg)  02/02/22 171 lb (77.6 kg)  Has recovered well from his Covid    Past Medical History:  Diagnosis Date   Cancer ([place])    skin   CKD (chronic kidney disease) 06/24/2013   Cochlear implant in place    Coronary artery disease    a. s/p CABG 2000; b. myoview 9/08: inf-septal scar with mild peri-infarct ischemia (low risk);  c.  Echo 8/13: mild focal basal septal hypertrophy, Gr 1 DD, mild LAE;  d. Lexiscan Myoview (06/20/2013): No reversible ischemia, inferior and septal infarct, inferior and septal hypokinesis, EF 60%;  e.  Echo (06/20/2011): EF 50-55%, normal wall motion, grade 1 diastolic dysfunction, mild LAE   Diverticular disease    Duodenitis    Elevated PSA    GERD (gastroesophageal reflux disease)    Gout    Humerus fracture    right   Hydronephrosis with ureteropelvic junction obstruction    Hypercalcemia 02/02/2012   a. 01/2012: 10.7.   Hyperlipidemia    Hypertension    Pancreatitis    a. ? Simvastatin related   Pneumonia age 57 or 7   "I think only once" (06/19/2013)   Syncope    a. 06/2013   TIA (transient ischemic attack)    a. 01/2012 - presentation as acute imbalance;  b. 01/2012 carotid U/S w/o signif stenosis;  c. 01/2012 Echo: EF 60-65%, Gr 1 DD.;  d.  Carotid  US (06/20/2013): 1-39% bilateral ICA stenosis   Tubular adenoma polyp of rectum    Type II diabetes mellitus ([place])    "diet and exercise controlled at present" (06/19/2013)   Past Surgical History:  Procedure Laterality Date   APPENDECTOMY     CARDIAC CATHETERIZATION  2000   CHOLECYSTECTOMY     COCHLEAR IMPLANT Right 1990's   COLONOSCOPY W/ POLYPECTOMY     no polyp 2012   CORONARY ARTERY BYPASS GRAFT  2000   CABG X5   INGUINAL HERNIA REPAIR Right 09/15/2015   Procedure: OPEN REPAIR RIGHT INGUINAL HERNIA ;  Surgeon: Jackolyn Confer, MD;  Location: [facility];  Service: General;  Laterality: Right;   INSERTION OF MESH Right 09/15/2015   Procedure: INSERTION OF MESH;  Surgeon: Jackolyn Confer, MD;  Location: [facility];  Service: General;  Laterality: Right;   PARATHYROIDECTOMY  1978   POLYPECTOMY     REVERSE SHOULDER ARTHROPLASTY Right 12/30/2014   Procedure: Right shoulder ORIF, Biomet S3 Plate and ZImmer cable system ;  Surgeon: Netta Cedars, MD;  Location: [facility];  Service: Orthopedics;  Laterality: Right;   right ankle surgery  age 49   TONSILLECTOMY  75   trachestomy as child for throat closing      Allergies  Allergen Reactions   Simvastatin Other (See Comments)    Possible statin-induced pancreatitis    Allergies as of 03/29/2022       Reactions   Simvastatin Other (See Comments)   Possible statin-induced pancreatitis        Medication List        Accurate as of March 29, 2022 11:59 PM. If you have any questions, ask your nurse or doctor.          acetaminophen 500 MG tablet Commonly known as: TYLENOL Take 1,000 mg by mouth 2 (two) times daily as needed for mild pain.   allopurinol 100 MG tablet Commonly known as: ZYLOPRIM Take 100 mg by mouth daily.   amLODipine 2.5 MG tablet Commonly known as: NORVASC Take 1 tablet (2.5 mg total) by mouth daily.   Cholecalciferol 50 MCG (2000 UT) Tabs Take 2,000 Units by mouth in the morning.   clopidogrel 75 MG tablet Commonly known as: PLAVIX Take 75 mg by mouth daily.   colchicine 0.6 MG tablet Take 0.6 mg by mouth daily as needed (as directed for gout flares).   Fenofibric Acid 135 MG Cpdr Take 1 tablet by mouth in the morning.   finasteride 5 MG tablet Commonly known as: PROSCAR Take 5 mg by mouth every evening.   hydrALAZINE 10 MG tablet Commonly known as: APRESOLINE Take 10 mg by mouth as needed.  SBP> 170   ipratropium-albuterol 0.5-2.5 (3) MG/3ML Soln Commonly known as: DUONEB Take 3 mLs by nebulization every 6 (six) hours as needed.   zinc oxide 20 % ointment Apply 1 application topically as needed for irritation.        Review of Systems  Constitutional:  Negative  for activity change, appetite change and unexpected weight change.  HENT: Negative.    Respiratory:  Negative for cough and shortness of breath.   Cardiovascular:  Negative for leg swelling.  Gastrointestinal:  Negative for constipation.  Genitourinary:  Negative for frequency.  Musculoskeletal:  Positive for gait problem. Negative for arthralgias and myalgias.  Skin: Negative.  Negative for rash.  Neurological:  Negative for dizziness and weakness.  Psychiatric/Behavioral:  Negative for confusion and sleep disturbance.   All other systems reviewed  and are negative.   Immunization History  Administered Date(s) Administered   Fluad Quad(high Dose 65+) 04/01/2019   Influenza Split 04/13/2011, 05/02/2012   Influenza Whole 05/22/2007, 04/12/2008, 05/02/2009, 04/14/2010   Influenza, High Dose Seasonal PF 04/17/2013, 03/16/2014, 03/05/2016, 03/18/2017   Influenza,inj,Quad PF,6+ Mos 03/02/2015, 03/18/2018   Influenza-Unspecified 03/18/2018, 04/12/2021   Moderna Covid-19 Vaccine Bivalent Booster 13yr & up 11/29/2021   Moderna SARS-COV2 Booster Vaccination 03/08/2021   Moderna Sars-Covid-2 Vaccination 06/22/2019, 07/20/2019, 05/02/2020, 11/23/2020   PPD Test 01/02/2015   Pneumococcal Conjugate-13 12/29/2015   Pneumococcal Polysaccharide-23 04/19/2006   Tdap 09/04/2016   Zoster Recombinat (Shingrix) 09/17/2019, 03/29/2020   Zoster, Live 06/29/2015   Pertinent  Health Maintenance Due  Topic Date Due   INFLUENZA VACCINE  01/16/2022   HEMOGLOBIN A1C  06/28/2022   FOOT EXAM  07/28/2022   OPHTHALMOLOGY EXAM  12/01/2022      12/24/2020   11:43 AM 01/09/2021   11:54 AM 04/12/2021    1:19 PM 01/10/2022   10:24 AM 01/10/2022   12:47 PM  Fall Risk  Falls in the past year?   1 0 1  Was there an injury with Fall?  1 0 0 0  Fall Risk Category Calculator   2 0 1  Fall Risk Category   Moderate Low Low  Patient Fall Risk Level High fall risk High fall risk  Low fall risk High fall risk  Patient  at Risk for Falls Due to  History of fall(s);Impaired balance/gait;Impaired mobility;Mental status change  No Fall Risks History of fall(s);Impaired mobility  Fall risk Follow up  Falls evaluation completed;Education provided;Falls prevention discussed  Falls evaluation completed Falls evaluation completed;Education provided;Falls prevention discussed   Functional Status Survey:    Vitals:   03/29/22 1319  BP: 137/83  Pulse: 82  Resp: 20  Temp: (!) 96 F (35.6 C)  SpO2: 93%  Weight: 172 lb (78 kg)  Height: '5\' 9"'$  (1.753 m)   Body mass index is 25.4 kg/m. Physical Exam Vitals reviewed.  Constitutional:      Appearance: Normal appearance.  HENT:     Head: Normocephalic.     Nose: Nose normal.     Mouth/Throat:     Mouth: Mucous membranes are moist.     Pharynx: Oropharynx is clear.  Eyes:     Pupils: Pupils are equal, round, and reactive to light.  Cardiovascular:     Rate and Rhythm: Normal rate and regular rhythm.     Pulses: Normal pulses.     Heart sounds: No murmur heard. Pulmonary:     Effort: Pulmonary effort is normal. No respiratory distress.     Breath sounds: Normal breath sounds. No rales.  Abdominal:     General: Abdomen is flat. Bowel sounds are normal.     Palpations: Abdomen is soft.  Musculoskeletal:        General: No swelling.     Cervical back: Neck supple.  Skin:    General: Skin is warm.  Neurological:     General: No focal deficit present.     Mental Status: He is alert and oriented to person, place, and time.  Psychiatric:        Mood and Affect: Mood normal.        Thought Content: Thought content normal.     Labs reviewed: Recent Labs    08/10/21 0000 08/11/21 0000 09/22/21 0000  NA 141 141 139  K 4.1 4.1 3.9  CL 108 108 106  CO2 29*  29* 26*  BUN 33* 33* 33*  CREATININE 1.2 1.2 1.4*  CALCIUM 10.7 10.7 10.4   Recent Labs    07/31/21 0000 08/10/21 0000 08/11/21 0000  AST '16 17 17  '$ ALT 8* 12 12  ALKPHOS 38 36 36  ALBUMIN  4.1 4.4 4.4   Recent Labs    07/31/21 0000  WBC 4.6  NEUTROABS 2,433.00  HGB 13.6  HCT 41  PLT 239   Lab Results  Component Value Date   TSH 1.79 10/26/2021   Lab Results  Component Value Date   HGBA1C 6.4 12/26/2021   Lab Results  Component Value Date   CHOL 188 09/22/2021   HDL 41 09/22/2021   LDLCALC 119 09/22/2021   LDLDIRECT 96.0 08/23/2020   TRIG 163 (A) 09/22/2021   CHOLHDL 4.4 10/21/2020    Significant Diagnostic Results in last 30 days:  No results found.  Assessment/Plan 1. Essential hypertension, benign Stable on Norvasc  2. Diabetes mellitus without complication ([place]) No Meds Aic Good level in 7/23  3. Shuffling gait Mild assist with his ADLS Stays in his wheel chair  Can do his transfers  4. TIA (transient ischemic attack) On Plavix Allergic to statins  5. Hx of CABG On Plavix  6. Mixed hyperlipidemia On Fenofibrate  7. History of gout Allopurinol  8. Benign prostatic hyperplasia with urinary obstruction Proscar    Family/ staff Communication:   Labs/tests ordered:

## 2022-03-31 ENCOUNTER — Encounter: Payer: Self-pay | Admitting: Internal Medicine

## 2022-04-17 ENCOUNTER — Encounter: Payer: Self-pay | Admitting: Adult Health

## 2022-04-17 ENCOUNTER — Non-Acute Institutional Stay (SKILLED_NURSING_FACILITY): Payer: Federal, State, Local not specified - PPO | Admitting: Adult Health

## 2022-04-17 DIAGNOSIS — H6121 Impacted cerumen, right ear: Secondary | ICD-10-CM

## 2022-04-17 NOTE — Progress Notes (Signed)
Location:  [place] Room Number: [place] of Service:  SNF (31) Provider:Zedekiah Hinderman Medina-Vargas NP   [name], NP  Patient Care Team: [name], NP as PCP - General (Adult Health Nurse Practitioner) [name], MD as PCP - Cardiology (Cardiology)  Extended Emergency Contact Information Primary Emergency Contact: [place], [place] of [place] Phone: [phone] Mobile Phone: [phone] Relation: Daughter Secondary Emergency Contact: [name],[name] Address: [address]           [place], [place] [address] [name] of [place] Phone: [phone] Mobile Phone: [phone] Relation: Daughter  Code Status: DNR  Goals of care: Advanced Directive information    04/17/2022    3:12 PM  Advanced Directives  Does Patient Have a Medical Advance Directive? Yes  Type of Paramedic of [place];Living will;Out of facility DNR (pink MOST or yellow form)  Does patient want to make changes to medical advance directive? No - Patient declined  Copy of [place] in Chart? Yes - validated most recent copy scanned in chart (See row information)  Pre-existing out of facility DNR order (yellow form or pink MOST form) Yellow form placed in chart (order not valid for inpatient use)     Chief Complaint  Patient presents with   Acute Visit    Right ear impaction    HPI:  Pt is a 86 y.o. male seen today for an acute visit for  right ear impaction. Patient has consulted [facility] Otolaryngology on 10 /27/23. He had impacted cerumen partially removed from the Otolaryngology clinic. He was noted to have moderate ear wax in his right ear. He denies pain.He has a PMH of CAD s/p CABG in 2000, hypertension and deafness s/p cochlear implant.  Past Medical History:  Diagnosis Date   Cancer ([place])    skin   CKD (chronic kidney disease) 06/24/2013   Cochlear implant in place    Coronary  artery disease    a. s/p CABG 2000; b. myoview 9/08: inf-septal scar with mild peri-infarct ischemia (low risk);  c.  Echo 8/13: mild focal basal septal hypertrophy, Gr 1 DD, mild LAE;  d. Lexiscan Myoview (06/20/2013): No reversible ischemia, inferior and septal infarct, inferior and septal hypokinesis, EF 60%;  e.  Echo (06/20/2011): EF 50-55%, normal wall motion, grade 1 diastolic dysfunction, mild LAE   Diverticular disease    Duodenitis    Elevated PSA    GERD (gastroesophageal reflux disease)    Gout    Humerus fracture    right   Hydronephrosis with ureteropelvic junction obstruction    Hypercalcemia 02/02/2012   a. 01/2012: 10.7.   Hyperlipidemia    Hypertension    Pancreatitis    a. ? Simvastatin related   Pneumonia age 59 or 7   "I think only once" (06/19/2013)   Syncope    a. 06/2013   TIA (transient ischemic attack)    a. 01/2012 - presentation as acute imbalance;  b. 01/2012 carotid U/S w/o signif stenosis;  c. 01/2012 Echo: EF 60-65%, Gr 1 DD.;  d.  Carotid US (06/20/2013): 1-39% bilateral ICA stenosis   Tubular adenoma polyp of rectum    Type II diabetes mellitus ([place])    "diet and exercise controlled at present" (06/19/2013)   Past Surgical History:  Procedure Laterality Date   [place]  COCHLEAR IMPLANT Right 1990's   COLONOSCOPY W/ POLYPECTOMY     no polyp 2012   CORONARY ARTERY BYPASS GRAFT  2000   CABG X5   INGUINAL HERNIA REPAIR Right 09/15/2015   Procedure: OPEN REPAIR RIGHT INGUINAL HERNIA ;  Surgeon: Jackolyn Confer, MD;  Location: [facility];  Service: General;  Laterality: Right;   INSERTION OF MESH Right 09/15/2015   Procedure: INSERTION OF MESH;  Surgeon: Jackolyn Confer, MD;  Location: [facility];  Service: General;  Laterality: Right;   PARATHYROIDECTOMY  1978   POLYPECTOMY     REVERSE SHOULDER ARTHROPLASTY Right 12/30/2014   Procedure: Right shoulder ORIF, Biomet S3 Plate and ZImmer cable system ;  Surgeon:  Netta Cedars, MD;  Location: [place];  Service: Orthopedics;  Laterality: Right;   right ankle surgery  age 49   TONSILLECTOMY  22   trachestomy as child for throat closing      Allergies  Allergen Reactions   Simvastatin Other (See Comments)    Possible statin-induced pancreatitis    Allergies as of 04/17/2022       Reactions   Simvastatin Other (See Comments)   Possible statin-induced pancreatitis        Medication List        Accurate as of April 17, 2022  3:39 PM. If you have any questions, ask your nurse or doctor.          acetaminophen 500 MG tablet Commonly known as: TYLENOL Take 1,000 mg by mouth 2 (two) times daily as needed for mild pain.   allopurinol 100 MG tablet Commonly known as: ZYLOPRIM Take 100 mg by mouth daily.   amLODipine 2.5 MG tablet Commonly known as: NORVASC Take 1 tablet (2.5 mg total) by mouth daily.   Cholecalciferol 50 MCG (2000 UT) Tabs Take 50 tablets by mouth in the morning. 50 mcg (2000 units) tab by mouth one time a day (Per [facility])   clopidogrel 75 MG tablet Commonly known as: PLAVIX Take 75 mg by mouth daily.   colchicine 0.6 MG tablet Take 0.6 mg by mouth daily as needed (as directed for gout flares).   Debrox 6.5 % OTIC solution Generic drug: carbamide peroxide Place 5 drops into the right ear 2 (two) times daily. For 5 days Starting 04/17/2022 - 04/22/2022   Fenofibric Acid 135 MG Cpdr Take 1 tablet by mouth in the morning.   finasteride 5 MG tablet Commonly known as: PROSCAR Take 5 mg by mouth every evening.   hydrALAZINE 10 MG tablet Commonly known as: APRESOLINE Take 10 mg by mouth as needed.  SBP> 170   ipratropium-albuterol 0.5-2.5 (3) MG/3ML Soln Commonly known as: DUONEB Take 3 mLs by nebulization every 6 (six) hours as needed.   zinc oxide 20 % ointment Apply 1 application  topically as needed for irritation. Apply transdermally as needed for skin irritation after each incontinent episode.         Review of Systems  Constitutional:  Negative for activity change, appetite change and fever.  HENT:  Negative for sore throat.   Eyes: Negative.   Cardiovascular:  Negative for chest pain and leg swelling.  Gastrointestinal:  Negative for abdominal distention, diarrhea and vomiting.  Genitourinary:  Negative for dysuria, frequency and urgency.  Skin:  Negative for color change.  Neurological:  Negative for dizziness and headaches.  Psychiatric/Behavioral:  Negative for behavioral problems and sleep disturbance. The patient is not nervous/anxious.     Immunization History  Administered Date(s) Administered  Fluad Quad(high Dose 65+) 04/01/2019   Influenza Split 04/13/2011, 05/02/2012   Influenza Whole 05/22/2007, 04/12/2008, 05/02/2009, 04/14/2010   Influenza, High Dose Seasonal PF 04/17/2013, 03/16/2014, 03/05/2016, 03/18/2017   Influenza,inj,Quad PF,6+ Mos 03/02/2015, 03/18/2018   Influenza-Unspecified 03/18/2018, 04/12/2021   Moderna Covid-19 Vaccine Bivalent Booster 65yr & up 11/29/2021   Moderna SARS-COV2 Booster Vaccination 03/08/2021   Moderna Sars-Covid-2 Vaccination 06/22/2019, 07/20/2019, 05/02/2020, 11/23/2020   PPD Test 01/02/2015   Pneumococcal Conjugate-13 12/29/2015   Pneumococcal Polysaccharide-23 04/19/2006   Tdap 09/04/2016   Zoster Recombinat (Shingrix) 09/17/2019, 03/29/2020   Zoster, Live 06/29/2015   Pertinent  Health Maintenance Due  Topic Date Due   INFLUENZA VACCINE  01/16/2022   HEMOGLOBIN A1C  06/28/2022   FOOT EXAM  07/28/2022   OPHTHALMOLOGY EXAM  12/01/2022      12/24/2020   11:43 AM 01/09/2021   11:54 AM 04/12/2021    1:19 PM 01/10/2022   10:24 AM 01/10/2022   12:47 PM  Fall Risk  Falls in the past year?   1 0 1  Was there an injury with Fall?  1 0 0 0  Fall Risk Category Calculator   2 0 1  Fall Risk Category   Moderate Low Low  Patient Fall Risk Level High fall risk High fall risk  Low fall risk High fall risk  Patient at Risk for  Falls Due to  History of fall(s);Impaired balance/gait;Impaired mobility;Mental status change  No Fall Risks History of fall(s);Impaired mobility  Fall risk Follow up  Falls evaluation completed;Education provided;Falls prevention discussed  Falls evaluation completed Falls evaluation completed;Education provided;Falls prevention discussed   Functional Status Survey:    Vitals:   04/17/22 1452  BP: 125/77  Pulse: 84  Resp: 19  Temp: (!) 97 F (36.1 C)  SpO2: 93%  Weight: 173 lb 1.6 oz (78.5 kg)  Height: '5\' 9"'$  (1.753 m)   Body mass index is 25.56 kg/m. Physical Exam Constitutional:      Appearance: Normal appearance.  HENT:     Head: Normocephalic and atraumatic.     Right Ear: There is impacted cerumen.     Mouth/Throat:     Mouth: Mucous membranes are moist.  Eyes:     Conjunctiva/sclera: Conjunctivae normal.  Cardiovascular:     Rate and Rhythm: Normal rate and regular rhythm.     Pulses: Normal pulses.     Heart sounds: Normal heart sounds.  Pulmonary:     Effort: Pulmonary effort is normal.     Breath sounds: Normal breath sounds.  Abdominal:     General: Bowel sounds are normal.     Palpations: Abdomen is soft.  Musculoskeletal:        General: No swelling. Normal range of motion.     Cervical back: Normal range of motion.  Skin:    General: Skin is warm and dry.  Neurological:     General: No focal deficit present.     Mental Status: He is alert and oriented to person, place, and time.  Psychiatric:        Mood and Affect: Mood normal.        Behavior: Behavior normal.        Thought Content: Thought content normal.        Judgment: Judgment normal.     Labs reviewed: Recent Labs    08/10/21 0000 08/11/21 0000 09/22/21 0000  NA 141 141 139  K 4.1 4.1 3.9  CL 108 108 106  CO2 29* 29* 26*  BUN 33* 33* 33*  CREATININE 1.2 1.2 1.4*  CALCIUM 10.7 10.7 10.4   Recent Labs    07/31/21 0000 08/10/21 0000 08/11/21 0000  AST '16 17 17  '$ ALT 8* 12 12   ALKPHOS 38 36 36  ALBUMIN 4.1 4.4 4.4   Recent Labs    07/31/21 0000  WBC 4.6  NEUTROABS 2,433.00  HGB 13.6  HCT 41  PLT 239   Lab Results  Component Value Date   TSH 1.79 10/26/2021   Lab Results  Component Value Date   HGBA1C 6.4 12/26/2021   Lab Results  Component Value Date   CHOL 188 09/22/2021   HDL 41 09/22/2021   LDLCALC 119 09/22/2021   LDLDIRECT 96.0 08/23/2020   TRIG 163 (A) 09/22/2021   CHOLHDL 4.4 10/21/2020    Significant Diagnostic Results in last 30 days:  No results found.  Assessment/Plan  1. Impacted cerumen of right ear -  will start on Debrox otic drops instill 5 gtts to right ear BID X 4 days -  Flush right ear on 5th day     Family/ staff Communication:  Discussed plan of care with resident and charge nurse.  Labs/tests ordered:   None

## 2022-04-25 ENCOUNTER — Encounter: Payer: Self-pay | Admitting: Orthopedic Surgery

## 2022-04-25 ENCOUNTER — Non-Acute Institutional Stay (SKILLED_NURSING_FACILITY): Payer: Federal, State, Local not specified - PPO | Admitting: Orthopedic Surgery

## 2022-04-25 DIAGNOSIS — E1122 Type 2 diabetes mellitus with diabetic chronic kidney disease: Secondary | ICD-10-CM | POA: Diagnosis not present

## 2022-04-25 DIAGNOSIS — H6121 Impacted cerumen, right ear: Secondary | ICD-10-CM | POA: Diagnosis not present

## 2022-04-25 DIAGNOSIS — G459 Transient cerebral ischemic attack, unspecified: Secondary | ICD-10-CM

## 2022-04-25 DIAGNOSIS — N138 Other obstructive and reflux uropathy: Secondary | ICD-10-CM

## 2022-04-25 DIAGNOSIS — N401 Enlarged prostate with lower urinary tract symptoms: Secondary | ICD-10-CM

## 2022-04-25 DIAGNOSIS — R2689 Other abnormalities of gait and mobility: Secondary | ICD-10-CM

## 2022-04-25 DIAGNOSIS — Z951 Presence of aortocoronary bypass graft: Secondary | ICD-10-CM

## 2022-04-25 DIAGNOSIS — Z8739 Personal history of other diseases of the musculoskeletal system and connective tissue: Secondary | ICD-10-CM

## 2022-04-25 DIAGNOSIS — E782 Mixed hyperlipidemia: Secondary | ICD-10-CM

## 2022-04-25 DIAGNOSIS — I10 Essential (primary) hypertension: Secondary | ICD-10-CM

## 2022-04-25 DIAGNOSIS — R413 Other amnesia: Secondary | ICD-10-CM

## 2022-04-25 DIAGNOSIS — R635 Abnormal weight gain: Secondary | ICD-10-CM

## 2022-04-25 DIAGNOSIS — N1831 Chronic kidney disease, stage 3a: Secondary | ICD-10-CM

## 2022-04-25 DIAGNOSIS — H905 Unspecified sensorineural hearing loss: Secondary | ICD-10-CM

## 2022-04-25 NOTE — Progress Notes (Signed)
Location:  [place] Room Number: 11/A Place of Service:  SNF (31) Provider:  [name], NP   [name], NP  Patient Care Team: [name], NP as PCP - General (Adult Health Nurse Practitioner) [name], MD as PCP - Cardiology (Cardiology)  Extended Emergency Contact Information Primary Emergency Contact: [place], [place] of [place] Phone: [phone] Mobile Phone: [phone] Relation: Daughter Secondary Emergency Contact: [name],[name] Address: [address]           [place], [place] [address] [name] of [place] Phone: [phone] Mobile Phone: [phone] Relation: Daughter  Code Status:  DNR Goals of care: Advanced Directive information    04/17/2022    3:12 PM  Advanced Directives  Does Patient Have a Medical Advance Directive? Yes  Type of Paramedic of [place];Living will;Out of facility DNR (pink MOST or yellow form)  Does patient want to make changes to medical advance directive? No - Patient declined  Copy of [place] in Chart? Yes - validated most recent copy scanned in chart (See row information)  Pre-existing out of facility DNR order (yellow form or pink MOST form) Yellow form placed in chart (order not valid for inpatient use)     Chief Complaint  Patient presents with   Medical Management of Chronic Issues    HPI:  Pt is a 86 y.o. male seen today for medical management of chronic diseases.    He currently resides on the skilled nursing unit at [facility]. PMH: CAD, s/p CABG 2000, HTN, TIA 2022, T2DM, HOH- cochlear implant, OA, BPH, CKD, memory deficit, shuffling gait, and weight loss.   T2DM- A1c 6.4 12/26/2021, taken off metformin due to stable A1c, last eye exam 11/30/2021, remains on carb modified diet Sensory hearing loss- followed by Dr. Owens Shark- seen 04/13/2022- f/u in 3 years, right cochlear implant in place,  10/31 received debrox gtts x 4 days  HxTIA- no further workup per neurology, remains on plavix and fenofibrate H/o CABG- evaluated by cardiology, 10/2020 LVEF 60-65%, remains on plavix HTN- BUN/creat 33/1.4 09/2021, remains on amlodipine/ hydralazine prn HLD- LDL 75 10/2020, allergic to statins Memory deficit- BIMS score 15/15 03/2022, CT head 12/2020 atrophy and chronic microvascular ischemic changes noted, very pleasant, no behaviors Shuffling gait- ambulates with wheelchair most of time, no recent falls or injury Weight gain- see weight trends below, TSH 1.79 10/26/2021 Hypercalcemia- calcium 10.4 (04/07)> 10.7 (02/20), PTH normal, TSH 1.79 10/26/2021, asymptomatic Hx of gout- uric acid 7.4 02/23, no recent flares, remains on allopurinol and colchicine prn BPH- remains on finasteride  04/11/2022 received flu vaccine, tolerated well.   Recent weights:  11/02- 172 lbs  10/12- 172 lbs  09/06- 165.5 lbs   Recent blood pressures:  11/08- 124/83  11/07- 143/85, 124/78  11/06- 138/91, 167/90    Past Surgical History:  Procedure Laterality Date   APPENDECTOMY     CARDIAC CATHETERIZATION  2000   CHOLECYSTECTOMY     COCHLEAR IMPLANT Right 1990's   COLONOSCOPY W/ POLYPECTOMY     no polyp 2012   CORONARY ARTERY BYPASS GRAFT  2000   CABG X5   INGUINAL HERNIA REPAIR Right 09/15/2015   Procedure: OPEN REPAIR RIGHT INGUINAL HERNIA ;  Surgeon: Jackolyn Confer, MD;  [facility];  Service: General;  Laterality: Right;   INSERTION OF MESH Right 09/15/2015   Procedure: INSERTION OF  MESH;  Surgeon: Jackolyn Confer, MD;  [facility];  Service: General;  Laterality: Right;   PARATHYROIDECTOMY  1978   POLYPECTOMY     REVERSE SHOULDER ARTHROPLASTY Right 12/30/2014   Procedure: Right shoulder ORIF, Biomet S3 Plate and ZImmer cable system ;  Surgeon: Netta Cedars, MD;  Location: [facility];  Service: Orthopedics;  Laterality: Right;   right ankle surgery  age 60   TONSILLECTOMY  40    trachestomy as child for throat closing      Allergies  Allergen Reactions   Simvastatin Other (See Comments)    Possible statin-induced pancreatitis    Outpatient Encounter Medications as of 04/25/2022  Medication Sig   acetaminophen (TYLENOL) 500 MG tablet Take 1,000 mg by mouth 2 (two) times daily as needed for mild pain.   allopurinol (ZYLOPRIM) 100 MG tablet Take 100 mg by mouth daily.   amLODipine (NORVASC) 2.5 MG tablet Take 1 tablet (2.5 mg total) by mouth daily.   carbamide peroxide (DEBROX) 6.5 % OTIC solution Place 5 drops into the right ear 2 (two) times daily. For 5 days Starting 04/17/2022 - 04/22/2022   Cholecalciferol 50 MCG (2000 UT) TABS Take 50 tablets by mouth in the morning. 50 mcg (2000 units) tab by mouth one time a day (Per [facility])   Choline Fenofibrate (FENOFIBRIC ACID) 135 MG CPDR Take 1 tablet by mouth in the morning.   clopidogrel (PLAVIX) 75 MG tablet Take 75 mg by mouth daily.   colchicine 0.6 MG tablet Take 0.6 mg by mouth daily as needed (as directed for gout flares).   finasteride (PROSCAR) 5 MG tablet Take 5 mg by mouth every evening.   hydrALAZINE (APRESOLINE) 10 MG tablet Take 10 mg by mouth as needed.  SBP> 170   ipratropium-albuterol (DUONEB) 0.5-2.5 (3) MG/3ML SOLN Take 3 mLs by nebulization every 6 (six) hours as needed.   zinc oxide 20 % ointment Apply 1 application  topically as needed for irritation. Apply transdermally as needed for skin irritation after each incontinent episode.   No facility-administered encounter medications on file as of 04/25/2022.    Review of Systems  Constitutional:  Negative for activity change, appetite change, chills, fatigue and fever.  HENT:  Positive for hearing loss. Negative for congestion and trouble swallowing.   Eyes:  Negative for visual disturbance.  Respiratory:  Negative for cough, shortness of breath and wheezing.   Cardiovascular:  Negative for chest pain and leg swelling.  Gastrointestinal:  Negative for  abdominal distention, abdominal pain, constipation, diarrhea, nausea and vomiting.  Genitourinary:  Negative for dysuria, frequency and hematuria.  Musculoskeletal:  Positive for gait problem.  Skin:  Negative for wound.  Neurological:  Negative for dizziness, weakness and headaches.  Psychiatric/Behavioral:  Negative for confusion, dysphoric mood and sleep disturbance. The patient is not nervous/anxious.     Immunization History  Administered Date(s) Administered   Fluad Quad(high Dose 65+) 04/01/2019   Influenza Split 04/13/2011, 05/02/2012   Influenza Whole 05/22/2007, 04/12/2008, 05/02/2009, 04/14/2010   Influenza, High Dose Seasonal PF 04/17/2013, 03/16/2014, 03/05/2016, 03/18/2017   Influenza,inj,Quad PF,6+ Mos 03/02/2015, 03/18/2018   Influenza-Unspecified 03/18/2018, 04/12/2021   Moderna Covid-19 Vaccine Bivalent Booster 28yr & up 11/29/2021   Moderna SARS-COV2 Booster Vaccination 03/08/2021   Moderna Sars-Covid-2 Vaccination 06/22/2019, 07/20/2019, 05/02/2020, 11/23/2020   PPD Test 01/02/2015   Pneumococcal Conjugate-13 12/29/2015   Pneumococcal Polysaccharide-23 04/19/2006   Tdap 09/04/2016   Zoster Recombinat (Shingrix) 09/17/2019, 03/29/2020   Zoster, Live 06/29/2015   Pertinent  Health Maintenance Due  Topic Date Due   INFLUENZA VACCINE  01/16/2022   HEMOGLOBIN A1C  06/28/2022   FOOT EXAM  07/28/2022   OPHTHALMOLOGY EXAM  12/01/2022      12/24/2020   11:43 AM 01/09/2021   11:54 AM 04/12/2021    1:19 PM 01/10/2022   10:24 AM 01/10/2022   12:47 PM  Fall Risk  Falls in the past year?   1 0 1  Was there an injury with Fall?  1 0 0 0  Fall Risk Category Calculator   2 0 1  Fall Risk Category   Moderate Low Low  Patient Fall Risk Level High fall risk High fall risk  Low fall risk High fall risk  Patient at Risk for Falls Due to  History of fall(s);Impaired balance/gait;Impaired mobility;Mental status change  No Fall Risks History of fall(s);Impaired mobility  Fall  risk Follow up  Falls evaluation completed;Education provided;Falls prevention discussed  Falls evaluation completed Falls evaluation completed;Education provided;Falls prevention discussed   Functional Status Survey:    Vitals:   04/25/22 1140  BP: 124/83  Pulse: 77  Resp: 20  Temp: (!) 96.8 F (36 C)  SpO2: 92%  Weight: 172 lb (78 kg)  Height: '5\' 9"'$  (1.753 m)   Body mass index is 25.4 kg/m. Physical Exam Vitals reviewed.  Constitutional:      General: He is not in acute distress. HENT:     Head: Normocephalic.     Right Ear: There is no impacted cerumen.     Left Ear: There is no impacted cerumen.     Ears:     Comments: Right cochlear implant    Nose: Nose normal.     Mouth/Throat:     Mouth: Mucous membranes are moist.  Eyes:     General:        Right eye: No discharge.        Left eye: No discharge.  Cardiovascular:     Rate and Rhythm: Normal rate and regular rhythm.     Pulses: Normal pulses.     Heart sounds: Normal heart sounds.  Pulmonary:     Effort: Pulmonary effort is normal. No respiratory distress.     Breath sounds: Normal breath sounds. No wheezing.  Abdominal:     General: Bowel sounds are normal. There is no distension.     Palpations: Abdomen is soft.     Tenderness: There is no abdominal tenderness.  Musculoskeletal:     Cervical back: Neck supple.     Right lower leg: No edema.     Left lower leg: No edema.  Skin:    General: Skin is warm and dry.     Capillary Refill: Capillary refill takes less than 2 seconds.  Neurological:     General: No focal deficit present.     Mental Status: He is alert and oriented to person, place, and time.     Motor: Weakness present.     Gait: Gait abnormal.     Comments: wheelchair  Psychiatric:        Mood and Affect: Mood normal.        Behavior: Behavior normal.     Labs reviewed: Recent Labs    08/10/21 0000 08/11/21 0000 09/22/21 0000  NA 141 141 139  K 4.1 4.1 3.9  CL 108 108 106  CO2  29* 29* 26*  BUN 33* 33* 33*  CREATININE 1.2 1.2 1.4*  CALCIUM 10.7 10.7 10.4   Recent  Labs    07/31/21 0000 08/10/21 0000 08/11/21 0000  AST '16 17 17  '$ ALT 8* 12 12  ALKPHOS 38 36 36  ALBUMIN 4.1 4.4 4.4   Recent Labs    07/31/21 0000  WBC 4.6  NEUTROABS 2,433.00  HGB 13.6  HCT 41  PLT 239   Lab Results  Component Value Date   TSH 1.79 10/26/2021   Lab Results  Component Value Date   HGBA1C 6.4 12/26/2021   Lab Results  Component Value Date   CHOL 188 09/22/2021   HDL 41 09/22/2021   LDLCALC 119 09/22/2021   LDLDIRECT 96.0 08/23/2020   TRIG 163 (A) 09/22/2021   CHOLHDL 4.4 10/21/2020    Significant Diagnostic Results in last 30 days:  No results found.  Assessment/Plan 1. Type 2 diabetes mellitus with stage 3a chronic kidney disease, without long-term current use of insulin (HCC) - GFR 52 (09/2021), 50 (07/2021) - off metformin - diet controlled - no hypoglycemias - eye exam 11/30/2021 - A1c - urine microalbumin  2. Sensory hearing loss - followed by Dr. Owens Shark- recently seen- f/u in 3 years - has right cochlear implant  3. Impacted cerumen of right ear - resolved with Debrox x 4 days and ear lavage  4. TIA (transient ischemic attack) - cont plavix and fenofibrate - statin intolerance  5. Hx of CABG - followed by cardiology - cont plavix and fenofibrate  6. Essential hypertension, benign - controlled with amlodipine/hydralazine prn  7. Mixed hyperlipidemia - cont fenofibrate  8. Memory deficit - BIMS 15/15 - no behaviors  9. Shuffling gait - no recent falls/injuries - ambulates well with wheelchair - cont skilled nursing  10. Weight gain - stable - cont monthly weights  11. Hypercalcemia - asymptomatic - PTH normal  12. History of gout - cont allopurinol and colchicine  13. Benign prostatic hyperplasia with urinary obstruction - cont finasteride    Family/ staff Communication: plan discussed with patient and  nurse  Labs/tests ordered:  cbc/diff, cmp, A1c, urine microalbumin 04/26/2022

## 2022-04-27 ENCOUNTER — Encounter: Payer: Self-pay | Admitting: Orthopedic Surgery

## 2022-04-27 ENCOUNTER — Non-Acute Institutional Stay (SKILLED_NURSING_FACILITY): Payer: Federal, State, Local not specified - PPO | Admitting: Orthopedic Surgery

## 2022-04-27 DIAGNOSIS — N1831 Type 2 diabetes mellitus with diabetic chronic kidney disease: Secondary | ICD-10-CM

## 2022-04-27 DIAGNOSIS — I10 Essential (primary) hypertension: Secondary | ICD-10-CM | POA: Diagnosis not present

## 2022-04-27 DIAGNOSIS — E1122 Type 2 diabetes mellitus with diabetic chronic kidney disease: Secondary | ICD-10-CM | POA: Diagnosis not present

## 2022-04-27 LAB — MICROALBUMIN / CREATININE URINE RATIO: Microalb Creat Ratio: 31

## 2022-04-27 LAB — MICROALBUMIN, URINE: Microalb, Ur: 3.4

## 2022-04-27 MED ORDER — METFORMIN HCL 500 MG PO TABS: 250.0000 mg | ORAL_TABLET | Freq: Every day | ORAL | 3 refills | Status: DC

## 2022-04-27 NOTE — Progress Notes (Signed)
Location:  [facility] Room Number: 11/A Place of Service:  SNF (31) Provider:  [name], NP   [name], NP  Patient Care Team: [name], NP as PCP - General (Adult Health Nurse Practitioner) [name], MD as PCP - Cardiology (Cardiology)  Extended Emergency Contact Information Primary Emergency Contact: [place], [place] Phone: [phone] Mobile Phone: [phone] Relation: Daughter Secondary Emergency Contact: [name],[name] Address: [address]           [place], [place] [address] [name] of [place] Phone: [phone] Mobile Phone: [phone] Relation: Daughter  Code Status:  DNR Goals of care: Advanced Directive information    04/17/2022    3:12 PM  Advanced Directives  Does Patient Have a Medical Advance Directive? Yes  Type of Paramedic of [place];Living will;Out of facility DNR (pink MOST or yellow form)  Does patient want to make changes to medical advance directive? No - Patient declined  Copy of [place] in Chart? Yes - validated most recent copy scanned in chart (See row information)  Pre-existing out of facility DNR order (yellow form or pink MOST form) Yellow form placed in chart (order not valid for inpatient use)     Chief Complaint  Patient presents with   Acute Visit    Elevated A1c    HPI:  Pt is a 86 y.o. male seen today for acute visit due to elevated A1c.   Recent lab work revealed A1c 7.2 (11/09)> was 6.4 (05/11) > 5.6(03/24/2021). He denies polyphagia, polyuria and polydipsia. He had some weight loss last year after hospitalization for TIA. He is bout 10 lbs heavier this year. Remains on a regular diet, but limits carbs.   HTN- BUN/creat 41/1.33 04/26/2022, remains on amlodipine/ hydralazine prn   Past Medical History:  Diagnosis Date   Cancer ([place])    skin   CKD (chronic kidney disease)  06/24/2013   Cochlear implant in place    Coronary artery disease    a. s/p CABG 2000; b. myoview 9/08: inf-septal scar with mild peri-infarct ischemia (low risk);  c.  Echo 8/13: mild focal basal septal hypertrophy, Gr 1 DD, mild LAE;  d. Lexiscan Myoview (06/20/2013): No reversible ischemia, inferior and septal infarct, inferior and septal hypokinesis, EF 60%;  e.  Echo (06/20/2011): EF 50-55%, normal wall motion, grade 1 diastolic dysfunction, mild LAE   Diverticular disease    Duodenitis    Elevated PSA    GERD (gastroesophageal reflux disease)    Gout    Humerus fracture    right   Hydronephrosis with ureteropelvic junction obstruction    Hypercalcemia 02/02/2012   a. 01/2012: 10.7.   Hyperlipidemia    Hypertension    Pancreatitis    a. ? Simvastatin related   Pneumonia age 23 or 7   "I think only once" (06/19/2013)   Syncope    a. 06/2013   TIA (transient ischemic attack)    a. 01/2012 - presentation as acute imbalance;  b. 01/2012 carotid U/S w/o signif stenosis;  c. 01/2012 Echo: EF 60-65%, Gr 1 DD.;  d.  Carotid US (06/20/2013): 1-39% bilateral ICA stenosis   Tubular adenoma polyp of rectum    Type II diabetes mellitus ([facility])    "diet and exercise controlled at present" (06/19/2013)   Past Surgical History:  Procedure Laterality Date   APPENDECTOMY  CARDIAC CATHETERIZATION  2000   CHOLECYSTECTOMY     COCHLEAR IMPLANT Right 1990's   COLONOSCOPY W/ POLYPECTOMY     no polyp 2012   CORONARY ARTERY BYPASS GRAFT  2000   CABG X5   INGUINAL HERNIA REPAIR Right 09/15/2015   Procedure: OPEN REPAIR RIGHT INGUINAL HERNIA ;  Surgeon: Jackolyn Confer, MD;  Location: [facility];  Service: General;  Laterality: Right;   INSERTION OF MESH Right 09/15/2015   Procedure: INSERTION OF MESH;  Surgeon: Jackolyn Confer, MD;  Location: [facility];  Service: General;  Laterality: Right;   PARATHYROIDECTOMY  1978   POLYPECTOMY     REVERSE SHOULDER ARTHROPLASTY Right 12/30/2014   Procedure: Right shoulder ORIF,  Biomet S3 Plate and ZImmer cable system ;  Surgeon: Netta Cedars, MD;  Location: [facility];  Service: Orthopedics;  Laterality: Right;   right ankle surgery  age 50   TONSILLECTOMY  21   trachestomy as child for throat closing      Allergies  Allergen Reactions   Simvastatin Other (See Comments)    Possible statin-induced pancreatitis    Outpatient Encounter Medications as of 04/27/2022  Medication Sig   acetaminophen (TYLENOL) 500 MG tablet Take 1,000 mg by mouth 2 (two) times daily as needed for mild pain.   allopurinol (ZYLOPRIM) 100 MG tablet Take 100 mg by mouth daily.   amLODipine (NORVASC) 2.5 MG tablet Take 1 tablet (2.5 mg total) by mouth daily.   carbamide peroxide (DEBROX) 6.5 % OTIC solution Place 5 drops into the right ear 2 (two) times daily. For 5 days Starting 04/17/2022 - 04/22/2022   Cholecalciferol 50 MCG (2000 UT) TABS Take 50 tablets by mouth in the morning. 50 mcg (2000 units) tab by mouth one time a day (Per [facility])   Choline Fenofibrate (FENOFIBRIC ACID) 135 MG CPDR Take 1 tablet by mouth in the morning.   clopidogrel (PLAVIX) 75 MG tablet Take 75 mg by mouth daily.   colchicine 0.6 MG tablet Take 0.6 mg by mouth daily as needed (as directed for gout flares).   finasteride (PROSCAR) 5 MG tablet Take 5 mg by mouth every evening.   hydrALAZINE (APRESOLINE) 10 MG tablet Take 10 mg by mouth as needed.  SBP> 170   ipratropium-albuterol (DUONEB) 0.5-2.5 (3) MG/3ML SOLN Take 3 mLs by nebulization every 6 (six) hours as needed.   zinc oxide 20 % ointment Apply 1 application  topically as needed for irritation. Apply transdermally as needed for skin irritation after each incontinent episode.   No facility-administered encounter medications on file as of 04/27/2022.    Review of Systems  Constitutional:  Negative for fatigue and fever.  HENT:  Positive for hearing loss. Negative for congestion and trouble swallowing.   Eyes:  Negative for visual disturbance.  Respiratory:   Negative for cough, shortness of breath and wheezing.   Cardiovascular:  Negative for chest pain and leg swelling.  Gastrointestinal:  Negative for abdominal pain.  Endocrine: Negative for polydipsia, polyphagia and polyuria.  Genitourinary:  Negative for dysuria, frequency and hematuria.  Musculoskeletal:  Positive for gait problem.  Skin:  Negative for wound.  Neurological:  Positive for weakness. Negative for dizziness and headaches.  Psychiatric/Behavioral:  Negative for confusion and dysphoric mood. The patient is not nervous/anxious.     Immunization History  Administered Date(s) Administered   Fluad Quad(high Dose 65+) 04/01/2019, 04/11/2022   Influenza Split 04/13/2011, 05/02/2012   Influenza Whole 05/22/2007, 04/12/2008, 05/02/2009, 04/14/2010   Influenza, High Dose Seasonal  PF 04/17/2013, 03/16/2014, 03/05/2016, 03/18/2017   Influenza,inj,Quad PF,6+ Mos 03/02/2015, 03/18/2018   Influenza-Unspecified 03/18/2018, 04/12/2021   Moderna Covid-19 Vaccine Bivalent Booster 25yr & up 11/29/2021   Moderna SARS-COV2 Booster Vaccination 03/08/2021   Moderna Sars-Covid-2 Vaccination 06/22/2019, 07/20/2019, 05/02/2020, 11/23/2020   PPD Test 01/02/2015   Pneumococcal Conjugate-13 12/29/2015   Pneumococcal Polysaccharide-23 04/19/2006   Tdap 09/04/2016   Zoster Recombinat (Shingrix) 09/17/2019, 03/29/2020   Zoster, Live 06/29/2015   Pertinent  Health Maintenance Due  Topic Date Due   HEMOGLOBIN A1C  06/28/2022   FOOT EXAM  07/28/2022   OPHTHALMOLOGY EXAM  12/01/2022   INFLUENZA VACCINE  Completed      12/24/2020   11:43 AM 01/09/2021   11:54 AM 04/12/2021    1:19 PM 01/10/2022   10:24 AM 01/10/2022   12:47 PM  Fall Risk  Falls in the past year?   1 0 1  Was there an injury with Fall?  1 0 0 0  Fall Risk Category Calculator   2 0 1  Fall Risk Category   Moderate Low Low  Patient Fall Risk Level High fall risk High fall risk  Low fall risk High fall risk  Patient at Risk for  Falls Due to  History of fall(s);Impaired balance/gait;Impaired mobility;Mental status change  No Fall Risks History of fall(s);Impaired mobility  Fall risk Follow up  Falls evaluation completed;Education provided;Falls prevention discussed  Falls evaluation completed Falls evaluation completed;Education provided;Falls prevention discussed   Functional Status Survey:    Vitals:   04/27/22 1138  BP: 139/82  Pulse: 83  Resp: 17  Temp: (!) 96.8 F (36 C)  SpO2: 96%  Weight: 162 lb (73.5 kg)  Height: '5\' 9"'$  (1.753 m)   Body mass index is 23.92 kg/m. Physical Exam Vitals reviewed.  Constitutional:      General: He is not in acute distress. HENT:     Head: Normocephalic.  Eyes:     General:        Right eye: No discharge.        Left eye: No discharge.  Cardiovascular:     Rate and Rhythm: Normal rate and regular rhythm.     Pulses: Normal pulses.     Heart sounds: Normal heart sounds.  Pulmonary:     Effort: Pulmonary effort is normal.     Breath sounds: Normal breath sounds.  Abdominal:     General: Bowel sounds are normal. There is no distension.     Palpations: Abdomen is soft.     Tenderness: There is no abdominal tenderness.  Musculoskeletal:     Cervical back: Neck supple.     Right lower leg: No edema.     Left lower leg: No edema.  Skin:    General: Skin is warm.     Capillary Refill: Capillary refill takes less than 2 seconds.  Neurological:     General: No focal deficit present.     Mental Status: He is alert and oriented to person, place, and time.  Psychiatric:        Mood and Affect: Mood normal.        Behavior: Behavior normal.     Labs reviewed: Recent Labs    08/10/21 0000 08/11/21 0000 09/22/21 0000  NA 141 141 139  K 4.1 4.1 3.9  CL 108 108 106  CO2 29* 29* 26*  BUN 33* 33* 33*  CREATININE 1.2 1.2 1.4*  CALCIUM 10.7 10.7 10.4   Recent Labs  07/31/21 0000 08/10/21 0000 08/11/21 0000  AST '16 17 17  '$ ALT 8* 12 12  ALKPHOS 38 36  36  ALBUMIN 4.1 4.4 4.4   Recent Labs    07/31/21 0000  WBC 4.6  NEUTROABS 2,433.00  HGB 13.6  HCT 41  PLT 239   Lab Results  Component Value Date   TSH 1.79 10/26/2021   Lab Results  Component Value Date   HGBA1C 6.4 12/26/2021   Lab Results  Component Value Date   CHOL 188 09/22/2021   HDL 41 09/22/2021   LDLCALC 119 09/22/2021   LDLDIRECT 96.0 08/23/2020   TRIG 163 (A) 09/22/2021   CHOLHDL 4.4 10/21/2020    Significant Diagnostic Results in last 30 days:  No results found.  Assessment/Plan 1. Type 2 diabetes mellitus with stage 3a chronic kidney disease, without long-term current use of insulin (HCC) - A1c 7.2 (11/09)> was 6.4 (05/11) > 5.6(03/24/2021) - suspect due to weight gain, improved appetite - eye exam 11/30/2021 - urine microalbumin- pending - CBG daily x 7 days, then weekly - plan to rechecl A1c in 3 months  2. Essential hypertension, benign - controlled - cont amlodipine/ hydralazine prn  3. Stage 3a chronic kidney disease (HCC) - BUN/creat 41/1.3, GFR 53> was 58 (07/2021) - avoid nephrotoxic drugs like NSAIDS and dose adjust medications to be renally excreted - encourage hydration with water    Family/ staff Communication: plan discussed with patient and nurse   Labs/tests ordered:  urine microalbumin- pending

## 2022-04-30 ENCOUNTER — Non-Acute Institutional Stay (SKILLED_NURSING_FACILITY): Payer: Federal, State, Local not specified - PPO | Admitting: Orthopedic Surgery

## 2022-04-30 ENCOUNTER — Other Ambulatory Visit: Payer: Self-pay | Admitting: Orthopedic Surgery

## 2022-04-30 ENCOUNTER — Encounter: Payer: Self-pay | Admitting: Orthopedic Surgery

## 2022-04-30 DIAGNOSIS — H905 Unspecified sensorineural hearing loss: Secondary | ICD-10-CM

## 2022-04-30 DIAGNOSIS — H6121 Impacted cerumen, right ear: Secondary | ICD-10-CM | POA: Diagnosis not present

## 2022-04-30 NOTE — Progress Notes (Signed)
Location:  [facility] Room Number: 11/A Place of Service:  SNF (31) Provider:  [name], NP   [name], NP  Patient Care Team: [name], NP as PCP - General (Adult Health Nurse Practitioner) Juan Hector, MD as PCP - Cardiology (Cardiology)  Extended Emergency Contact Information Primary Emergency Contact: [place], Juan [name] of [place] Phone: [phone] Mobile Phone: [phone] Relation: Daughter Secondary Emergency Contact: [name],Juan Address: [address]           [place], [place] [address] [name] of [place] Phone: [phone] Mobile Phone: [phone] Relation: Daughter  Code Status:  DNR Goals of care: Advanced Directive information    04/30/2022    2:09 PM  Advanced Directives  Does Patient Have a Medical Advance Directive? Yes  Type of Paramedic of [place];Living will;Out of facility DNR (pink MOST or yellow form)  Does patient want to make changes to medical advance directive? No - Patient declined  Copy of [place] in Chart? Yes - validated most recent copy scanned in chart (See row information)  Pre-existing out of facility DNR order (yellow form or pink MOST form) Yellow form placed in chart (order not valid for inpatient use)     Chief Complaint  Patient presents with   Acute Visit    Right ear fullness     HPI:  Pt is a 86 y.o. male seen today for acute visit due to right ear fulness.   H/o sensory hearing loss, right cochlear implant. 10/31 he was diagnosed with right ear cerumen impaction. Completed 4 days of Debrox gtts. Nursing flushed right ear after debrox was complete. Today, he admits to using a Q-tip a few times to remove excess wax. He continues to have right ear fullness. Denies ear pain, tinnitus or decreased hearing or drainage.   Past Medical History:  Diagnosis Date   Cancer ([place])    skin   CKD  (chronic kidney disease) 06/24/2013   Cochlear implant in place    Coronary artery disease    a. s/p CABG 2000; b. myoview 9/08: inf-septal scar with mild peri-infarct ischemia (low risk);  c.  Echo 8/13: mild focal basal septal hypertrophy, Gr 1 DD, mild LAE;  d. Lexiscan Myoview (06/20/2013): No reversible ischemia, inferior and septal infarct, inferior and septal hypokinesis, EF 60%;  e.  Echo (06/20/2011): EF 50-55%, normal wall motion, grade 1 diastolic dysfunction, mild LAE   Diverticular disease    Duodenitis    Elevated PSA    GERD (gastroesophageal reflux disease)    Gout    Humerus fracture    right   Hydronephrosis with ureteropelvic junction obstruction    Hypercalcemia 02/02/2012   a. 01/2012: 10.7.   Hyperlipidemia    Hypertension    Pancreatitis    a. ? Simvastatin related   Pneumonia age 73 or 7   "I think only once" (06/19/2013)   Syncope    a. 06/2013   TIA (transient ischemic attack)    a. 01/2012 - presentation as acute imbalance;  b. 01/2012 carotid U/S w/o signif stenosis;  c. 01/2012 Echo: EF 60-65%, Gr 1 DD.;  d.  Carotid US (06/20/2013): 1-39% bilateral ICA stenosis   Tubular adenoma polyp of rectum    Type II diabetes mellitus ([place])    "diet and exercise controlled at present" (06/19/2013)   Past Surgical History:  Procedure  Laterality Date   APPENDECTOMY     CARDIAC CATHETERIZATION  2000   CHOLECYSTECTOMY     COCHLEAR IMPLANT Right 1990's   COLONOSCOPY W/ POLYPECTOMY     no polyp 2012   CORONARY ARTERY BYPASS GRAFT  2000   CABG X5   INGUINAL HERNIA REPAIR Right 09/15/2015   Procedure: OPEN REPAIR RIGHT INGUINAL HERNIA ;  Surgeon: Jackolyn Confer, MD;  [facility];  Service: General;  Laterality: Right;   INSERTION OF MESH Right 09/15/2015   Procedure: INSERTION OF MESH;  Surgeon: Jackolyn Confer, MD;  [facility];  Service: General;  Laterality: Right;   PARATHYROIDECTOMY  1978   POLYPECTOMY     REVERSE SHOULDER ARTHROPLASTY Right 12/30/2014   Procedure:  Right shoulder ORIF, Biomet S3 Plate and ZImmer cable system ;  Surgeon: Netta Cedars, MD;  Location: [facility];  Service: Orthopedics;  Laterality: Right;   right ankle surgery  age 71   TONSILLECTOMY  82   trachestomy as child for throat closing      Allergies  Allergen Reactions   Simvastatin Other (See Comments)    Possible statin-induced pancreatitis    Outpatient Encounter Medications as of 04/30/2022  Medication Sig   acetaminophen (TYLENOL) 500 MG tablet Take 1,000 mg by mouth 2 (two) times daily as needed for mild pain.   allopurinol (ZYLOPRIM) 100 MG tablet Take 100 mg by mouth daily.   amLODipine (NORVASC) 2.5 MG tablet Take 1 tablet (2.5 mg total) by mouth daily.   carbamide peroxide (DEBROX) 6.5 % OTIC solution Place 5 drops into the right ear 2 (two) times daily. For 5 days Starting 04/17/2022 - 04/22/2022   Cholecalciferol 50 MCG (2000 UT) TABS Take 50 tablets by mouth in the morning. 50 mcg (2000 units) tab by mouth one time a day (Per [facility])   Choline Fenofibrate (FENOFIBRIC ACID) 135 MG CPDR Take 1 tablet by mouth in the morning.   clopidogrel (PLAVIX) 75 MG tablet Take 75 mg by mouth daily.   colchicine 0.6 MG tablet Take 0.6 mg by mouth daily as needed (as directed for gout flares).   finasteride (PROSCAR) 5 MG tablet Take 5 mg by mouth every evening.   hydrALAZINE (APRESOLINE) 10 MG tablet Take 10 mg by mouth as needed.  SBP> 170   ipratropium-albuterol (DUONEB) 0.5-2.5 (3) MG/3ML SOLN Take 3 mLs by nebulization every 6 (six) hours as needed.   metFORMIN (GLUCOPHAGE) 500 MG tablet Take 0.5 tablets (250 mg total) by mouth daily with breakfast.   zinc oxide 20 % ointment Apply 1 application  topically as needed for irritation. Apply transdermally as needed for skin irritation after each incontinent episode.   No facility-administered encounter medications on file as of 04/30/2022.    Review of Systems  Constitutional:  Negative for chills and fever.  HENT:  Positive for  hearing loss. Negative for ear discharge, ear pain, rhinorrhea and tinnitus.        Ear fullness  Respiratory:  Negative for cough, shortness of breath and wheezing.   Cardiovascular:  Negative for chest pain and leg swelling.  Psychiatric/Behavioral:  Negative for agitation, confusion and dysphoric mood. The patient is not nervous/anxious.     Immunization History  Administered Date(s) Administered   Fluad Quad(high Dose 65+) 04/01/2019, 04/11/2022   Influenza Split 04/13/2011, 05/02/2012   Influenza Whole 05/22/2007, 04/12/2008, 05/02/2009, 04/14/2010   Influenza, High Dose Seasonal PF 04/17/2013, 03/16/2014, 03/05/2016, 03/18/2017   Influenza,inj,Quad PF,6+ Mos 03/02/2015, 03/18/2018   Influenza-Unspecified 03/18/2018,  04/12/2021   Moderna Covid-19 Vaccine Bivalent Booster 53yr & up 11/29/2021   Moderna SARS-COV2 Booster Vaccination 03/08/2021   Moderna Sars-Covid-2 Vaccination 06/22/2019, 07/20/2019, 05/02/2020, 11/23/2020   PPD Test 01/02/2015   Pneumococcal Conjugate-13 12/29/2015   Pneumococcal Polysaccharide-23 04/19/2006   Tdap 09/04/2016   Zoster Recombinat (Shingrix) 09/17/2019, 03/29/2020   Zoster, Live 06/29/2015   Pertinent  Health Maintenance Due  Topic Date Due   HEMOGLOBIN A1C  06/28/2022   FOOT EXAM  07/28/2022   OPHTHALMOLOGY EXAM  12/01/2022   INFLUENZA VACCINE  Completed      01/09/2021   11:54 AM 04/12/2021    1:19 PM 01/10/2022   10:24 AM 01/10/2022   12:47 PM 04/30/2022    2:09 PM  Fall Risk  Falls in the past year?  1 0 1 0  Was there an injury with Fall? 1 0 0 0 0  Fall Risk Category Calculator  2 0 1 0  Fall Risk Category  Moderate Low Low Low  Patient Fall Risk Level High fall risk  Low fall risk High fall risk Low fall risk  Patient at Risk for Falls Due to History of fall(s);Impaired balance/gait;Impaired mobility;Mental status change  No Fall Risks History of fall(s);Impaired mobility No Fall Risks  Fall risk Follow up Falls evaluation  completed;Education provided;Falls prevention discussed  Falls evaluation completed Falls evaluation completed;Education provided;Falls prevention discussed Falls evaluation completed   Functional Status Survey:    Vitals:   04/30/22 1508  BP: 133/87  Pulse: 95  Resp: 20  Temp: (!) 96.8 F (36 C)  SpO2: 92%  Weight: 162 lb (73.5 kg)  Height: '5\' 9"'$  (1.753 m)   Body mass index is 23.92 kg/m. Physical Exam Vitals reviewed.  Constitutional:      General: He is not in acute distress. HENT:     Head: Normocephalic.     Right Ear: There is impacted cerumen.     Left Ear: Tympanic membrane normal.     Ears:     Comments: Right cochlear implant Eyes:     General:        Right eye: No discharge.        Left eye: No discharge.  Cardiovascular:     Rate and Rhythm: Normal rate and regular rhythm.     Pulses: Normal pulses.     Heart sounds: Normal heart sounds.  Pulmonary:     Effort: Pulmonary effort is normal.     Breath sounds: Normal breath sounds.  Musculoskeletal:     Cervical back: Neck supple.  Neurological:     General: No focal deficit present.     Mental Status: He is alert and oriented to person, place, and time.     Motor: Weakness present.     Gait: Gait abnormal.  Psychiatric:        Mood and Affect: Mood normal.        Behavior: Behavior normal.     Labs reviewed: Recent Labs    08/10/21 0000 08/11/21 0000 09/22/21 0000  NA 141 141 139  K 4.1 4.1 3.9  CL 108 108 106  CO2 29* 29* 26*  BUN 33* 33* 33*  CREATININE 1.2 1.2 1.4*  CALCIUM 10.7 10.7 10.4   Recent Labs    07/31/21 0000 08/10/21 0000 08/11/21 0000  AST '16 17 17  '$ ALT 8* 12 12  ALKPHOS 38 36 36  ALBUMIN 4.1 4.4 4.4   Recent Labs    07/31/21 0000  WBC 4.6  NEUTROABS 2,433.00  HGB 13.6  HCT 41  PLT 239   Lab Results  Component Value Date   TSH 1.79 10/26/2021   Lab Results  Component Value Date   HGBA1C 6.4 12/26/2021   Lab Results  Component Value Date   CHOL 188  09/22/2021   HDL 41 09/22/2021   LDLCALC 119 09/22/2021   LDLDIRECT 96.0 08/23/2020   TRIG 163 (A) 09/22/2021   CHOLHDL 4.4 10/21/2020    Significant Diagnostic Results in last 30 days:  No results found.  Assessment/Plan 1. Impacted cerumen of right ear - narrow ear canal, unable to visualize TM - start debrox 5gtts- apply to right ear x 3 days - flush ears with warm water when Debrox complete - do not use Qtips - may need ear lavage  2. Sensory hearing loss - followed by Dr. Owens Shark- 04/13/2022 - cont right cochlear implant    Family/ staff Communication: plan discussed with patient and nurse  Labs/tests ordered:  none

## 2022-04-30 NOTE — Progress Notes (Signed)
error 

## 2022-04-30 NOTE — Progress Notes (Signed)
This encounter was created in error - please disregard.

## 2022-04-30 NOTE — Progress Notes (Deleted)
Location:  [facility] Room Number: 11/A Place of Service:  SNF (31) Provider:  Jaclyn Prime  Patient Care Team: [name], NP as PCP - General (Adult Health Nurse Practitioner) [name], MD as PCP - Cardiology (Cardiology)  Extended Emergency Contact Information Primary Emergency Contact: [place], [place] of [place] Phone: [phone] Mobile Phone: [phone] Relation: Daughter Secondary Emergency Contact: [name],[name] Address: [address]           [place], [place] [address] [name] of [place] Phone: [phone] Mobile Phone: [phone] Relation: Daughter  Code Status:  DNR Goals of care: Advanced Directive information    04/30/2022    2:09 PM  Advanced Directives  Does Patient Have a Medical Advance Directive? Yes  Type of Paramedic of [place];Living will;Out of facility DNR (pink MOST or yellow form)  Does patient want to make changes to medical advance directive? No - Patient declined  Copy of [place] in Chart? Yes - validated most recent copy scanned in chart (See row information)  Pre-existing out of facility DNR order (yellow form or pink MOST form) Yellow form placed in chart (order not valid for inpatient use)     Chief Complaint  Patient presents with   Acute Visit    Ear fullness, Discuss Diabetic Kidney Evalution and Updated Labs    HPI:  Pt is a 86 y.o. male seen today for an acute visit for    Past Medical History:  Diagnosis Date   Cancer ([place])    skin   CKD (chronic kidney disease) 06/24/2013   Cochlear implant in place    Coronary artery disease    a. s/p CABG 2000; b. myoview 9/08: inf-septal scar with mild peri-infarct ischemia (low risk);  c.  Echo 8/13: mild focal basal septal hypertrophy, Gr 1 DD, mild LAE;  d. Lexiscan Myoview (06/20/2013): No reversible ischemia, inferior and septal infarct, inferior and  septal hypokinesis, EF 60%;  e.  Echo (06/20/2011): EF 50-55%, normal wall motion, grade 1 diastolic dysfunction, mild LAE   Diverticular disease    Duodenitis    Elevated PSA    GERD (gastroesophageal reflux disease)    Gout    Humerus fracture    right   Hydronephrosis with ureteropelvic junction obstruction    Hypercalcemia 02/02/2012   a. 01/2012: 10.7.   Hyperlipidemia    Hypertension    Pancreatitis    a. ? Simvastatin related   Pneumonia age 39 or 7   "I think only once" (06/19/2013)   Syncope    a. 06/2013   TIA (transient ischemic attack)    a. 01/2012 - presentation as acute imbalance;  b. 01/2012 carotid U/S w/o signif stenosis;  c. 01/2012 Echo: EF 60-65%, Gr 1 DD.;  d.  Carotid US (06/20/2013): 1-39% bilateral ICA stenosis   Tubular adenoma polyp of rectum    Type II diabetes mellitus ([place])    "diet and exercise controlled at present" (06/19/2013)   Past Surgical History:  Procedure Laterality Date   APPENDECTOMY     CARDIAC CATHETERIZATION  2000   CHOLECYSTECTOMY     COCHLEAR IMPLANT Right 1990's   COLONOSCOPY W/ POLYPECTOMY     no polyp 2012   CORONARY ARTERY BYPASS GRAFT  2000   CABG X5   INGUINAL HERNIA REPAIR Right 09/15/2015   Procedure: OPEN REPAIR RIGHT INGUINAL HERNIA ;  Surgeon: Sherren Mocha  Rosenbower, MD;  Location: [facility];  Service: General;  Laterality: Right;   INSERTION OF MESH Right 09/15/2015   Procedure: INSERTION OF MESH;  Surgeon: Jackolyn Confer, MD;  Location: [facility];  Service: General;  Laterality: Right;   PARATHYROIDECTOMY  1978   POLYPECTOMY     REVERSE SHOULDER ARTHROPLASTY Right 12/30/2014   Procedure: Right shoulder ORIF, Biomet S3 Plate and ZImmer cable system ;  Surgeon: Netta Cedars, MD;  Location: [facility];  Service: Orthopedics;  Laterality: Right;   right ankle surgery  age 70   TONSILLECTOMY  89   trachestomy as child for throat closing      Allergies  Allergen Reactions   Simvastatin Other (See Comments)    Possible statin-induced  pancreatitis    Outpatient Encounter Medications as of 04/30/2022  Medication Sig   acetaminophen (TYLENOL) 500 MG tablet Take 1,000 mg by mouth 2 (two) times daily as needed for mild pain.   allopurinol (ZYLOPRIM) 100 MG tablet Take 100 mg by mouth daily.   amLODipine (NORVASC) 2.5 MG tablet Take 1 tablet (2.5 mg total) by mouth daily.   carbamide peroxide (DEBROX) 6.5 % OTIC solution Place 5 drops into the right ear 2 (two) times daily. For 5 days Starting 04/17/2022 - 04/22/2022   Cholecalciferol 50 MCG (2000 UT) TABS Take 50 tablets by mouth in the morning. 50 mcg (2000 units) tab by mouth one time a day (Per [facility])   Choline Fenofibrate (FENOFIBRIC ACID) 135 MG CPDR Take 1 tablet by mouth in the morning.   clopidogrel (PLAVIX) 75 MG tablet Take 75 mg by mouth daily.   colchicine 0.6 MG tablet Take 0.6 mg by mouth daily as needed (as directed for gout flares).   finasteride (PROSCAR) 5 MG tablet Take 5 mg by mouth every evening.   hydrALAZINE (APRESOLINE) 10 MG tablet Take 10 mg by mouth as needed.  SBP> 170   ipratropium-albuterol (DUONEB) 0.5-2.5 (3) MG/3ML SOLN Take 3 mLs by nebulization every 6 (six) hours as needed.   metFORMIN (GLUCOPHAGE) 500 MG tablet Take 0.5 tablets (250 mg total) by mouth daily with breakfast.   zinc oxide 20 % ointment Apply 1 application  topically as needed for irritation. Apply transdermally as needed for skin irritation after each incontinent episode.   No facility-administered encounter medications on file as of 04/30/2022.    Review of Systems  Immunization History  Administered Date(s) Administered   Fluad Quad(high Dose 65+) 04/01/2019, 04/11/2022   Influenza Split 04/13/2011, 05/02/2012   Influenza Whole 05/22/2007, 04/12/2008, 05/02/2009, 04/14/2010   Influenza, High Dose Seasonal PF 04/17/2013, 03/16/2014, 03/05/2016, 03/18/2017   Influenza,inj,Quad PF,6+ Mos 03/02/2015, 03/18/2018   Influenza-Unspecified 03/18/2018, 04/12/2021   Moderna  Covid-19 Vaccine Bivalent Booster 59yr & up 11/29/2021   Moderna SARS-COV2 Booster Vaccination 03/08/2021   Moderna Sars-Covid-2 Vaccination 06/22/2019, 07/20/2019, 05/02/2020, 11/23/2020   PPD Test 01/02/2015   Pneumococcal Conjugate-13 12/29/2015   Pneumococcal Polysaccharide-23 04/19/2006   Tdap 09/04/2016   Zoster Recombinat (Shingrix) 09/17/2019, 03/29/2020   Zoster, Live 06/29/2015   Pertinent  Health Maintenance Due  Topic Date Due   HEMOGLOBIN A1C  06/28/2022   FOOT EXAM  07/28/2022   OPHTHALMOLOGY EXAM  12/01/2022   INFLUENZA VACCINE  Completed      01/09/2021   11:54 AM 04/12/2021    1:19 PM 01/10/2022   10:24 AM 01/10/2022   12:47 PM 04/30/2022    2:09 PM  Fall Risk  Falls in the past year?  1 0 1 0  Was there an injury with Fall? 1 0 0 0 0  Fall Risk Category Calculator  2 0 1 0  Fall Risk Category  Moderate Low Low Low  Patient Fall Risk Level High fall risk  Low fall risk High fall risk Low fall risk  Patient at Risk for Falls Due to History of fall(s);Impaired balance/gait;Impaired mobility;Mental status change  No Fall Risks History of fall(s);Impaired mobility No Fall Risks  Fall risk Follow up Falls evaluation completed;Education provided;Falls prevention discussed  Falls evaluation completed Falls evaluation completed;Education provided;Falls prevention discussed Falls evaluation completed   Functional Status Survey:    There were no vitals filed for this visit. There is no height or weight on file to calculate BMI. Physical Exam  Labs reviewed: Recent Labs    08/10/21 0000 08/11/21 0000 09/22/21 0000  NA 141 141 139  K 4.1 4.1 3.9  CL 108 108 106  CO2 29* 29* 26*  BUN 33* 33* 33*  CREATININE 1.2 1.2 1.4*  CALCIUM 10.7 10.7 10.4   Recent Labs    07/31/21 0000 08/10/21 0000 08/11/21 0000  AST '16 17 17  '$ ALT 8* 12 12  ALKPHOS 38 36 36  ALBUMIN 4.1 4.4 4.4   Recent Labs    07/31/21 0000  WBC 4.6  NEUTROABS 2,433.00  HGB 13.6  HCT 41   PLT 239   Lab Results  Component Value Date   TSH 1.79 10/26/2021   Lab Results  Component Value Date   HGBA1C 6.4 12/26/2021   Lab Results  Component Value Date   CHOL 188 09/22/2021   HDL 41 09/22/2021   LDLCALC 119 09/22/2021   LDLDIRECT 96.0 08/23/2020   TRIG 163 (A) 09/22/2021   CHOLHDL 4.4 10/21/2020    Significant Diagnostic Results in last 30 days:  No results found.  Assessment/Plan There are no diagnoses linked to this encounter.   Family/ staff Communication: ***  Labs/tests ordered:  *** This encounter was created in error - please disregard.

## 2022-05-03 ENCOUNTER — Other Ambulatory Visit: Payer: Self-pay | Admitting: Orthopedic Surgery

## 2022-05-23 ENCOUNTER — Encounter: Payer: Self-pay | Admitting: Orthopedic Surgery

## 2022-05-23 ENCOUNTER — Non-Acute Institutional Stay (SKILLED_NURSING_FACILITY): Payer: Federal, State, Local not specified - PPO | Admitting: Orthopedic Surgery

## 2022-05-23 DIAGNOSIS — H905 Unspecified sensorineural hearing loss: Secondary | ICD-10-CM

## 2022-05-23 DIAGNOSIS — N1831 Chronic kidney disease, stage 3a: Secondary | ICD-10-CM

## 2022-05-23 DIAGNOSIS — R413 Other amnesia: Secondary | ICD-10-CM

## 2022-05-23 DIAGNOSIS — Z951 Presence of aortocoronary bypass graft: Secondary | ICD-10-CM

## 2022-05-23 DIAGNOSIS — E1122 Type 2 diabetes mellitus with diabetic chronic kidney disease: Secondary | ICD-10-CM

## 2022-05-23 DIAGNOSIS — E782 Mixed hyperlipidemia: Secondary | ICD-10-CM

## 2022-05-23 DIAGNOSIS — N401 Enlarged prostate with lower urinary tract symptoms: Secondary | ICD-10-CM

## 2022-05-23 DIAGNOSIS — G459 Transient cerebral ischemic attack, unspecified: Secondary | ICD-10-CM

## 2022-05-23 DIAGNOSIS — I10 Essential (primary) hypertension: Secondary | ICD-10-CM

## 2022-05-23 DIAGNOSIS — N138 Other obstructive and reflux uropathy: Secondary | ICD-10-CM

## 2022-05-23 DIAGNOSIS — Z8739 Personal history of other diseases of the musculoskeletal system and connective tissue: Secondary | ICD-10-CM

## 2022-05-23 NOTE — Progress Notes (Signed)
Location:  [facility] Room Number: 11/A Place of Service:  SNF (31) Provider:  [name], NP  Patient Care Team: [name], NP as PCP - General (Adult Health Nurse Practitioner) [name], MD as PCP - Cardiology (Cardiology)  Extended Emergency Contact Information Primary Emergency Contact: [place], [place] of [place] Phone: [phone] Mobile Phone: [phone] Relation: Daughter Secondary Emergency Contact: [name],[name] Address: [address]           [place], [place] [address] [name] of [place] Phone: [phone] Mobile Phone: [phone] Relation: Daughter  Code Status: DNR Goals of care: Advanced Directive information    05/23/2022    9:48 AM  Advanced Directives  Does Patient Have a Medical Advance Directive? Yes  Type of Paramedic of [place];Living will;Out of facility DNR (pink MOST or yellow form)  Does patient want to make changes to medical advance directive? No - Patient declined  Copy of [place] in Chart? Yes - validated most recent copy scanned in chart (See row information)  Pre-existing out of facility DNR order (yellow form or pink MOST form) Yellow form placed in chart (order not valid for inpatient use)     Chief Complaint  Patient presents with   Medical Management of Chronic Issues    Routine Visit the provider [facility].    HPI:  Pt is a 86 y.o. male seen today for medical management of chronic diseases.    He currently resides on the skilled nursing unit at [facility]. PMH: CAD, s/p CABG 2000, HTN, TIA 2022, T2DM, HOH- cochlear implant, OA, BPH, CKD, memory deficit, shuffling gait, and weight loss.    T2DM- A1c 7.2 (11/09)> was 6.4 12/26/2021, blood sugars 150-170's- no hypoglycemias, urine microalbumin 31 04/27/2022, eye exam 11/30/2021, started on metformin 250 mg last month, remains on  carb modified diet Sensory hearing loss- followed by Dr. Owens Shark- seen 04/13/2022- f/u in 3 years, right cochlear implant in place HxTIA- no further workup per neurology, remains on plavix and fenofibrate H/o CABG- evaluated by cardiology, 10/2020 LVEF 60-65%, remains on plavix HTN- BUN/creat 33/1.4 09/2021, remains on amlodipine HLD- LDL 75 10/2020, allergic to statins CKD- see above Memory deficit- BIMS score 15/15 03/2022, CT head 12/2020 atrophy and chronic microvascular ischemic changes noted, very pleasant, no behaviors Hypercalcemia- calcium 10.4 (04/07)> 10.7 (02/20), PTH normal, TSH 1.79 10/26/2021, asymptomatic Hx of gout- uric acid 7.4 02/23, no recent flares, remains on allopurinol and colchicine prn BPH- remains on finasteride  No recent falls or injuries. Ambulates with wheelchair.   Recent blood pressures:  12/05- 162/81  12/03- 130/75  12/02- 148/77  12/01- 154/91  Recent weights:  12/01- 170 lbs  11/01- 175 lbs  10/12- 172 lbs  Past Medical History:  Diagnosis Date   Cancer ([place])    skin   CKD (chronic kidney disease) 06/24/2013   Cochlear implant in place    Coronary artery disease    a. s/p CABG 2000; b. myoview 9/08: inf-septal scar with mild peri-infarct ischemia (low risk);  c.  Echo 8/13: mild focal basal septal hypertrophy, Gr 1 DD, mild LAE;  d. Lexiscan Myoview (06/20/2013): No reversible ischemia, inferior and septal infarct, inferior and septal hypokinesis, EF 60%;  e.  Echo (06/20/2011): EF 50-55%, normal wall motion, grade 1 diastolic dysfunction, mild LAE   Diverticular disease  Duodenitis    Elevated PSA    GERD (gastroesophageal reflux disease)    Gout    Humerus fracture    right   Hydronephrosis with ureteropelvic junction obstruction    Hypercalcemia 02/02/2012   a. 01/2012: 10.7.   Hyperlipidemia    Hypertension    Pancreatitis    a. ? Simvastatin related   Pneumonia age 17 or 7   "I think only once" (06/19/2013)   Syncope    a. 06/2013    TIA (transient ischemic attack)    a. 01/2012 - presentation as acute imbalance;  b. 01/2012 carotid U/S w/o signif stenosis;  c. 01/2012 Echo: EF 60-65%, Gr 1 DD.;  d.  Carotid US (06/20/2013): 1-39% bilateral ICA stenosis   Tubular adenoma polyp of rectum    Type II diabetes mellitus ([place])    "diet and exercise controlled at present" (06/19/2013)   Past Surgical History:  Procedure Laterality Date   APPENDECTOMY     CARDIAC CATHETERIZATION  2000   CHOLECYSTECTOMY     COCHLEAR IMPLANT Right 1990's   COLONOSCOPY W/ POLYPECTOMY     no polyp 2012   CORONARY ARTERY BYPASS GRAFT  2000   CABG X5   INGUINAL HERNIA REPAIR Right 09/15/2015   Procedure: OPEN REPAIR RIGHT INGUINAL HERNIA ;  Surgeon: Jackolyn Confer, MD;  [facility];  Service: General;  Laterality: Right;   INSERTION OF MESH Right 09/15/2015   Procedure: INSERTION OF MESH;  Surgeon: Jackolyn Confer, MD;  [facility];  Service: General;  Laterality: Right;   PARATHYROIDECTOMY  1978   POLYPECTOMY     REVERSE SHOULDER ARTHROPLASTY Right 12/30/2014   Procedure: Right shoulder ORIF, Biomet S3 Plate and ZImmer cable system ;  Surgeon: Netta Cedars, MD;  Location: [facility];  Service: Orthopedics;  Laterality: Right;   right ankle surgery  age 80   TONSILLECTOMY  20   trachestomy as child for throat closing      Allergies  Allergen Reactions   Simvastatin Other (See Comments)    Possible statin-induced pancreatitis    Outpatient Encounter Medications as of 05/23/2022  Medication Sig   acetaminophen (TYLENOL) 500 MG tablet Take 1,000 mg by mouth 2 (two) times daily as needed for mild pain.   allopurinol (ZYLOPRIM) 100 MG tablet Take 100 mg by mouth daily.   amLODipine (NORVASC) 2.5 MG tablet Take 1 tablet (2.5 mg total) by mouth daily.   Cholecalciferol 50 MCG (2000 UT) TABS Take 50 tablets by mouth in the morning. 50 mcg (2000 units) tab by mouth one time a day (Per [facility])   Choline Fenofibrate (FENOFIBRIC ACID) 135 MG CPDR Take 1  tablet by mouth in the morning.   clopidogrel (PLAVIX) 75 MG tablet Take 75 mg by mouth daily.   colchicine 0.6 MG tablet Take 0.6 mg by mouth daily as needed (as directed for gout flares).   finasteride (PROSCAR) 5 MG tablet Take 5 mg by mouth every evening.   ipratropium-albuterol (DUONEB) 0.5-2.5 (3) MG/3ML SOLN Take 3 mLs by nebulization every 6 (six) hours as needed.   metFORMIN (GLUCOPHAGE) 500 MG tablet Take 0.5 tablets (250 mg total) by mouth daily with breakfast.   zinc oxide 20 % ointment Apply 1 application  topically as needed for irritation. Apply transdermally as needed for skin irritation after each incontinent episode.   [DISCONTINUED] carbamide peroxide (DEBROX) 6.5 % OTIC solution Place 5 drops into the right ear 2 (two) times daily. For 5 days Starting 04/17/2022 -  04/22/2022   [DISCONTINUED] hydrALAZINE (APRESOLINE) 10 MG tablet Take 10 mg by mouth as needed.  SBP> 170   No facility-administered encounter medications on file as of 05/23/2022.    Review of Systems  Constitutional:  Negative for activity change, appetite change, fatigue and fever.  HENT:  Positive for hearing loss. Negative for trouble swallowing.   Eyes:  Negative for visual disturbance.  Respiratory:  Negative for cough, shortness of breath and wheezing.   Cardiovascular:  Negative for chest pain and leg swelling.  Gastrointestinal:  Negative for abdominal distention, abdominal pain, constipation, diarrhea, nausea and vomiting.  Genitourinary:  Positive for frequency. Negative for dysuria and hematuria.  Musculoskeletal:  Positive for gait problem.  Skin:  Negative for wound.  Neurological:  Positive for weakness. Negative for dizziness and headaches.  Psychiatric/Behavioral:  Negative for confusion, dysphoric mood and sleep disturbance. The patient is not nervous/anxious.     Immunization History  Administered Date(s) Administered   Fluad Quad(high Dose 65+) 04/01/2019, 04/11/2022   Influenza Split  04/13/2011, 05/02/2012   Influenza Whole 05/22/2007, 04/12/2008, 05/02/2009, 04/14/2010   Influenza, High Dose Seasonal PF 04/17/2013, 03/16/2014, 03/05/2016, 03/18/2017   Influenza,inj,Quad PF,6+ Mos 03/02/2015, 03/18/2018   Influenza-Unspecified 03/18/2018, 04/12/2021   Moderna Covid-19 Vaccine Bivalent Booster 22yr & up 11/29/2021   Moderna SARS-COV2 Booster Vaccination 03/08/2021   Moderna Sars-Covid-2 Vaccination 06/22/2019, 07/20/2019, 05/02/2020, 11/23/2020   PPD Test 01/02/2015   Pneumococcal Conjugate-13 12/29/2015   Pneumococcal Polysaccharide-23 04/19/2006   Tdap 09/04/2016   Unspecified SARS-COV-2 Vaccination 04/24/2022   Zoster Recombinat (Shingrix) 09/17/2019, 03/29/2020   Zoster, Live 06/29/2015   Pertinent  Health Maintenance Due  Topic Date Due   HEMOGLOBIN A1C  06/28/2022   FOOT EXAM  07/28/2022   OPHTHALMOLOGY EXAM  12/01/2022   INFLUENZA VACCINE  Completed      04/12/2021    1:19 PM 01/10/2022   10:24 AM 01/10/2022   12:47 PM 04/30/2022    2:09 PM 05/23/2022    9:44 AM  Fall Risk  Falls in the past year? 1 0 1 0 0  Was there an injury with Fall? 0 0 0 0 0  Fall Risk Category Calculator 2 0 1 0 0  Fall Risk Category Moderate Low Low Low Low  Patient Fall Risk Level  Low fall risk High fall risk Low fall risk Low fall risk  Patient at Risk for Falls Due to  No Fall Risks History of fall(s);Impaired mobility No Fall Risks No Fall Risks  Fall risk Follow up  Falls evaluation completed Falls evaluation completed;Education provided;Falls prevention discussed Falls evaluation completed Falls evaluation completed   Functional Status Survey:    Vitals:   05/23/22 0940  BP: (!) 162/81  Pulse: 77  Resp: 16  Temp: (!) 96.8 F (36 C)  SpO2: 96%  Weight: 172 lb 2 oz (78.1 kg)  Height: '5\' 9"'$  (1.753 m)   Body mass index is 25.42 kg/m. Physical Exam Vitals reviewed.  Constitutional:      General: He is not in acute distress. HENT:     Head: Normocephalic.      Right Ear: There is no impacted cerumen.     Left Ear: There is no impacted cerumen.     Ears:     Comments: Right cochlear implant    Nose: Nose normal.     Mouth/Throat:     Mouth: Mucous membranes are moist.  Eyes:     General:        Right eye:  No discharge.        Left eye: No discharge.  Cardiovascular:     Rate and Rhythm: Normal rate and regular rhythm.     Pulses: Normal pulses.     Heart sounds: Normal heart sounds.  Pulmonary:     Effort: Pulmonary effort is normal. No respiratory distress.     Breath sounds: Normal breath sounds. No wheezing.  Abdominal:     General: Bowel sounds are normal. There is no distension.     Tenderness: There is no abdominal tenderness.  Musculoskeletal:     Cervical back: Neck supple.     Right lower leg: No edema.     Left lower leg: No edema.  Skin:    General: Skin is warm and dry.     Capillary Refill: Capillary refill takes less than 2 seconds.  Neurological:     General: No focal deficit present.     Mental Status: He is alert and oriented to person, place, and time.     Motor: Weakness present.     Gait: Gait abnormal.     Comments: wheelchair  Psychiatric:        Mood and Affect: Mood normal.        Behavior: Behavior normal.     Labs reviewed: Recent Labs    08/10/21 0000 08/11/21 0000 09/22/21 0000  NA 141 141 139  K 4.1 4.1 3.9  CL 108 108 106  CO2 29* 29* 26*  BUN 33* 33* 33*  CREATININE 1.2 1.2 1.4*  CALCIUM 10.7 10.7 10.4   Recent Labs    07/31/21 0000 08/10/21 0000 08/11/21 0000  AST '16 17 17  '$ ALT 8* 12 12  ALKPHOS 38 36 36  ALBUMIN 4.1 4.4 4.4   Recent Labs    07/31/21 0000  WBC 4.6  NEUTROABS 2,433.00  HGB 13.6  HCT 41  PLT 239   Lab Results  Component Value Date   TSH 1.79 10/26/2021   Lab Results  Component Value Date   HGBA1C 6.4 12/26/2021   Lab Results  Component Value Date   CHOL 188 09/22/2021   HDL 41 09/22/2021   LDLCALC 119 09/22/2021   LDLDIRECT 96.0  08/23/2020   TRIG 163 (A) 09/22/2021   CHOLHDL 4.4 10/21/2020    Significant Diagnostic Results in last 30 days:  No results found.  Assessment/Plan 1. Type 2 diabetes mellitus with stage 3a chronic kidney disease, without long-term current use of insulin (HCC) - A1c 7.2 (11/09)> was 6.4 12/26/2021 - blood sugars 150-170's, no hypoglycemia -  urine microalbumin 31 04/27/2022 - eye exam 11/30/2021  - started on metformin 250 mg last month  - cont carb modified diet - recheck A1c 07/2022  2. Sensory hearing loss - followed by Dr. Owens Shark- recently seen- f/u in 3 years - has right cochlear implant  3. TIA (transient ischemic attack) - cont plavix and fenofibrate  4. Hx of CABG - see above  5. Essential hypertension, benign - controlled - cont amlodipine  6. Mixed hyperlipidemia - cont fenofibrate  7. Stage 3a chronic kidney disease (HCC) - avoid nephrotoxic drugs like NSAIDS and dose adjust medications to be renally excreted - encourage hydration with water   8. Memory deficit - BIMS 15/15 - appropriate, no behaviors  9. Hypercalcemia - asymptomatic  - PTH normal  10. History of gout - no recent flares - cont allopurinol and Colchicine prn  11. Benign prostatic hyperplasia with urinary obstruction - cont finasteride  Family/ staff Communication: plan discussed with patient and nurse  Labs/tests ordered:  cbc/diff, cmp

## 2022-05-24 ENCOUNTER — Encounter: Payer: Self-pay | Admitting: Internal Medicine

## 2022-05-24 NOTE — Progress Notes (Signed)
A user error has taken place.

## 2022-06-21 ENCOUNTER — Encounter: Payer: Self-pay | Admitting: Internal Medicine

## 2022-06-21 ENCOUNTER — Non-Acute Institutional Stay (SKILLED_NURSING_FACILITY): Payer: Federal, State, Local not specified - PPO | Admitting: Internal Medicine

## 2022-06-21 DIAGNOSIS — Z951 Presence of aortocoronary bypass graft: Secondary | ICD-10-CM

## 2022-06-21 DIAGNOSIS — N401 Enlarged prostate with lower urinary tract symptoms: Secondary | ICD-10-CM

## 2022-06-21 DIAGNOSIS — N138 Other obstructive and reflux uropathy: Secondary | ICD-10-CM

## 2022-06-21 DIAGNOSIS — G459 Transient cerebral ischemic attack, unspecified: Secondary | ICD-10-CM | POA: Diagnosis not present

## 2022-06-21 DIAGNOSIS — E1122 Type 2 diabetes mellitus with diabetic chronic kidney disease: Secondary | ICD-10-CM

## 2022-06-21 DIAGNOSIS — I10 Essential (primary) hypertension: Secondary | ICD-10-CM

## 2022-06-21 DIAGNOSIS — N1831 Chronic kidney disease, stage 3a: Secondary | ICD-10-CM

## 2022-06-21 DIAGNOSIS — L89891 Pressure ulcer of other site, stage 1: Secondary | ICD-10-CM

## 2022-06-21 DIAGNOSIS — E782 Mixed hyperlipidemia: Secondary | ICD-10-CM

## 2022-06-21 NOTE — Progress Notes (Signed)
Location:   [facility] Room Number: N11-A Place of Service:  SNF [phone]) Provider:  Einar Crow, MD  PCP: [name], NP  Patient Care Team: [name], NP as PCP - General (Adult Health Nurse Practitioner) [name], MD as PCP - Cardiology (Cardiology)  Extended Emergency Contact Information Primary Emergency Contact: [name],[name]          [place], [place] of [place] Home Phone: [phone] Mobile Phone: [phone] Relation: Daughter Secondary Emergency Contact: [name],[name] Address: [address]           [place], [place] [address] [name] of [place] Home Phone: [phone] Mobile Phone: [phone] Relation: Daughter  Code Status:  DNR Goals of care: Advanced Directive information    06/21/2022   11:45 AM  Advanced Directives  Does Patient Have a Medical Advance Directive? Yes  Type of Estate agent of [place];Living will;Out of facility DNR (pink MOST or yellow form)  Does patient want to make changes to medical advance directive? No - Patient declined  Copy of Healthcare Power of Attorney in Chart? Yes - validated most recent copy scanned in chart (See row information)     Chief Complaint  Patient presents with   Medical Management of Chronic Issues    Routine Visit.   Immunizations    Discuss the need for [employer].    HPI:  Pt is a 87 y.o. male seen today for medical management of chronic diseases.    Lives in SNF  Patient has a history of CAD, S/P CABG in 2000, gout, HLD, hypertension and diabetes Unstable Gait No further work up per Neurology Has Auditory Implants   He is stable. No new Nursing issues.  Has lost some weight since been on Metformin but appetite is good Stays in his wheelchair though can do his transfers No Falls Wt Readings from Last 3 Encounters:  06/21/22 162 lb (73.5 kg)  05/23/22 172 lb 2 oz (78.1 kg)  04/30/22 162 lb (73.5 kg)   His only complain  today was some irritation of his skin near his right pinna where his implant is    Past Medical History:  Diagnosis Date   Cancer (HCC)    skin   CKD (chronic kidney disease) 06/24/2013   Cochlear implant in place    Coronary artery disease    a. s/p CABG 2000; b. myoview 9/08: inf-septal scar with mild peri-infarct ischemia (low risk);  c.  Echo 8/13: mild focal basal septal hypertrophy, Gr 1 DD, mild LAE;  d. Lexiscan Myoview (06/20/2013): No reversible ischemia, inferior and septal infarct, inferior and septal hypokinesis, EF 60%;  e.  Echo (06/20/2011): EF 50-55%, normal wall motion, grade 1 diastolic dysfunction, mild LAE   Diverticular disease    Duodenitis    Elevated PSA    GERD (gastroesophageal reflux disease)    Gout    Humerus fracture    right   Hydronephrosis with ureteropelvic junction obstruction    Hypercalcemia 02/02/2012   a. 01/2012: 10.7.   Hyperlipidemia    Hypertension    Pancreatitis    a. ? Simvastatin related   Pneumonia age 28 or 7   "I think only once" (06/19/2013)   Syncope    a. 06/2013   TIA (transient ischemic attack)    a. 01/2012 - presentation as acute imbalance;  b. 01/2012 carotid U/S w/o signif stenosis;  c. 01/2012 Echo: EF 60-65%, Gr 1 DD.;  d.  Carotid US (  06/20/2013): 1-39% bilateral ICA stenosis   Tubular adenoma polyp of rectum    Type II diabetes mellitus (HCC)    "diet and exercise controlled at present" (06/19/2013)   Past Surgical History:  Procedure Laterality Date   APPENDECTOMY     CARDIAC CATHETERIZATION  2000   CHOLECYSTECTOMY     COCHLEAR IMPLANT Right 1990's   COLONOSCOPY W/ POLYPECTOMY     no polyp 2012   CORONARY ARTERY BYPASS GRAFT  2000   CABG X5   INGUINAL HERNIA REPAIR Right 09/15/2015   Procedure: OPEN REPAIR RIGHT INGUINAL HERNIA ;  Surgeon: Avel Peace, MD;  Location: [facility];  Service: General;  Laterality: Right;   INSERTION OF MESH Right 09/15/2015   Procedure: INSERTION OF MESH;  Surgeon: Avel Peace, MD;   Location: [facility];  Service: General;  Laterality: Right;   PARATHYROIDECTOMY  1978   POLYPECTOMY     REVERSE SHOULDER ARTHROPLASTY Right 12/30/2014   Procedure: Right shoulder ORIF, Biomet S3 Plate and ZImmer cable system ;  Surgeon: Beverely Low, MD;  Location: [facility];  Service: Orthopedics;  Laterality: Right;   right ankle surgery  age 81   TONSILLECTOMY  68   trachestomy as child for throat closing      Allergies  Allergen Reactions   Simvastatin Other (See Comments)    Possible statin-induced pancreatitis    Allergies as of 06/21/2022       Reactions   Simvastatin Other (See Comments)   Possible statin-induced pancreatitis        Medication List        Accurate as of June 21, 2022 11:46 AM. If you have any questions, ask your nurse or doctor.          acetaminophen 500 MG tablet Commonly known as: TYLENOL Take 1,000 mg by mouth 2 (two) times daily as needed for mild pain.   allopurinol 100 MG tablet Commonly known as: ZYLOPRIM Take 100 mg by mouth daily.   amLODipine 2.5 MG tablet Commonly known as: NORVASC Take 1 tablet (2.5 mg total) by mouth daily.   Cholecalciferol 50 MCG (2000 UT) Tabs Take 50 tablets by mouth in the morning. 50 mcg (2000 units) tab by mouth one time a day (Per [facility])   clopidogrel 75 MG tablet Commonly known as: PLAVIX Take 75 mg by mouth daily.   colchicine 0.6 MG tablet Take 0.6 mg by mouth daily as needed (as directed for gout flares).   Fenofibric Acid 135 MG Cpdr Take 1 tablet by mouth in the morning.   finasteride 5 MG tablet Commonly known as: PROSCAR Take 5 mg by mouth every evening.   ipratropium-albuterol 0.5-2.5 (3) MG/3ML Soln Commonly known as: DUONEB Take 3 mLs by nebulization every 6 (six) hours as needed.   metFORMIN 500 MG tablet Commonly known as: GLUCOPHAGE Take 0.5 tablets (250 mg total) by mouth daily with breakfast.   zinc oxide 20 % ointment Apply 1 application  topically as needed for irritation.  Apply transdermally as needed for skin irritation after each incontinent episode.        Review of Systems  Constitutional:  Negative for activity change, appetite change and unexpected weight change.  HENT: Negative.    Respiratory:  Negative for cough and shortness of breath.   Cardiovascular:  Negative for leg swelling.  Gastrointestinal:  Negative for constipation.  Genitourinary:  Negative for frequency.  Musculoskeletal:  Positive for gait problem. Negative for arthralgias and myalgias.  Skin: Negative.  Negative  for rash.  Neurological:  Negative for dizziness and weakness.  Psychiatric/Behavioral:  Negative for confusion and sleep disturbance.   All other systems reviewed and are negative.   Immunization History  Administered Date(s) Administered   Fluad Quad(high Dose 65+) 04/01/2019, 04/11/2022   Influenza Split 04/13/2011, 05/02/2012   Influenza Whole 05/22/2007, 04/12/2008, 05/02/2009, 04/14/2010   Influenza, High Dose Seasonal PF 04/17/2013, 03/16/2014, 03/05/2016, 03/18/2017   Influenza,inj,Quad PF,6+ Mos 03/02/2015, 03/18/2018   Influenza-Unspecified 03/18/2018, 04/12/2021   Moderna Covid-19 Vaccine Bivalent Booster 65yrs & up 11/29/2021   Moderna SARS-COV2 Booster Vaccination 03/08/2021   Moderna Sars-Covid-2 Vaccination 06/22/2019, 07/20/2019, 05/02/2020, 11/23/2020   PPD Test 01/02/2015   Pneumococcal Conjugate-13 12/29/2015   Pneumococcal Polysaccharide-23 04/19/2006   Tdap 09/04/2016   Unspecified SARS-COV-2 Vaccination 04/24/2022   Zoster Recombinat (Shingrix) 09/17/2019, 03/29/2020   Zoster, Live 06/29/2015   Pertinent  Health Maintenance Due  Topic Date Due   HEMOGLOBIN A1C  06/28/2022   FOOT EXAM  07/28/2022   OPHTHALMOLOGY EXAM  12/01/2022   INFLUENZA VACCINE  Completed      04/12/2021    1:19 PM 01/10/2022   10:24 AM 01/10/2022   12:47 PM 04/30/2022    2:09 PM 05/23/2022    9:44 AM  Fall Risk  Falls in the past year? 1 0 1 0 0  Was there  an injury with Fall? 0 0 0 0 0  Fall Risk Category Calculator 2 0 1 0 0  Fall Risk Category Moderate Low Low Low Low  Patient Fall Risk Level  Low fall risk High fall risk Low fall risk Low fall risk  Patient at Risk for Falls Due to  No Fall Risks History of fall(s);Impaired mobility No Fall Risks No Fall Risks  Fall risk Follow up  Falls evaluation completed Falls evaluation completed;Education provided;Falls prevention discussed Falls evaluation completed Falls evaluation completed   Functional Status Survey:    Vitals:   06/21/22 1141  BP: (!) 135/94  Pulse: 84  Resp: 18  Temp: 98.1 F (36.7 C)  SpO2: 97%  Weight: 162 lb (73.5 kg)  Height: 5\' 9"  (1.753 m)   Body mass index is 23.92 kg/m. Physical Exam Vitals reviewed.  Constitutional:      Appearance: Normal appearance.  HENT:     Head: Normocephalic.     Nose: Nose normal.     Mouth/Throat:     Mouth: Mucous membranes are moist.     Pharynx: Oropharynx is clear.  Eyes:     Pupils: Pupils are equal, round, and reactive to light.  Cardiovascular:     Rate and Rhythm: Normal rate and regular rhythm.     Pulses: Normal pulses.     Heart sounds: No murmur heard. Pulmonary:     Effort: Pulmonary effort is normal. No respiratory distress.     Breath sounds: Normal breath sounds. No rales.  Abdominal:     General: Abdomen is flat. Bowel sounds are normal.     Palpations: Abdomen is soft.  Musculoskeletal:        General: No swelling.     Cervical back: Neck supple.  Skin:    General: Skin is warm.     Comments: Small PU on Right Pinna where he wears his auditory imlant  Neurological:     Mental Status: He is alert and oriented to person, place, and time.     Comments: Has speech Impairment   Psychiatric:        Mood and Affect: Mood normal.  Thought Content: Thought content normal.     Labs reviewed: Recent Labs    08/10/21 0000 08/11/21 0000 09/22/21 0000  NA 141 141 139  K 4.1 4.1 3.9  CL 108  108 106  CO2 29* 29* 26*  BUN 33* 33* 33*  CREATININE 1.2 1.2 1.4*  CALCIUM 10.7 10.7 10.4   Recent Labs    07/31/21 0000 08/10/21 0000 08/11/21 0000  AST 16 17 17   ALT 8* 12 12  ALKPHOS 38 36 36  ALBUMIN 4.1 4.4 4.4   Recent Labs    07/31/21 0000  WBC 4.6  NEUTROABS 2,433.00  HGB 13.6  HCT 41  PLT 239   Lab Results  Component Value Date   TSH 1.79 10/26/2021   Lab Results  Component Value Date   HGBA1C 6.4 12/26/2021   Lab Results  Component Value Date   CHOL 188 09/22/2021   HDL 41 09/22/2021   LDLCALC 119 09/22/2021   LDLDIRECT 96.0 08/23/2020   TRIG 163 (A) 09/22/2021   CHOLHDL 4.4 10/21/2020    Significant Diagnostic Results in last 30 days:  No results found.  Assessment/Plan 1. Type 2 diabetes mellitus with stage 3a chronic kidney disease, without long-term current use of insulin (HCC) Now on Metformin urine microalbumin 31 04/27/2022 -eye exam 11/30/2021  2. Essential hypertension, benign On Norvasc  3. TIA (transient ischemic attack) On Plavix Not on statin as allergic  4. Hx of CABG   5. Mixed hyperlipidemia On Fenofibrate  6. Stage 3b chronic kidney disease (HCC) Repeat Creat has been stable Avoid NSAIDS  7. Hypercalcemia Calcium has been mildly high  Will discontinue Vit D supplement  8. Benign prostatic hyperplasia with urinary obstruction On Proscar  9 Gout Allopurinol 10 PU on Right Ear stage 1 Foam Dressing Discussed with the Nurse  Family/ staff Communication:   Labs/tests ordered:

## 2022-08-07 ENCOUNTER — Encounter: Payer: Self-pay | Admitting: Adult Health

## 2022-08-07 ENCOUNTER — Non-Acute Institutional Stay (SKILLED_NURSING_FACILITY): Payer: Federal, State, Local not specified - PPO | Admitting: Adult Health

## 2022-08-07 DIAGNOSIS — N401 Enlarged prostate with lower urinary tract symptoms: Secondary | ICD-10-CM

## 2022-08-07 DIAGNOSIS — G459 Transient cerebral ischemic attack, unspecified: Secondary | ICD-10-CM | POA: Diagnosis not present

## 2022-08-07 DIAGNOSIS — I10 Essential (primary) hypertension: Secondary | ICD-10-CM

## 2022-08-07 DIAGNOSIS — H9191 Unspecified hearing loss, right ear: Secondary | ICD-10-CM

## 2022-08-07 DIAGNOSIS — N1831 Chronic kidney disease, stage 3a: Secondary | ICD-10-CM

## 2022-08-07 DIAGNOSIS — E1122 Type 2 diabetes mellitus with diabetic chronic kidney disease: Secondary | ICD-10-CM

## 2022-08-07 DIAGNOSIS — N138 Other obstructive and reflux uropathy: Secondary | ICD-10-CM

## 2022-08-07 NOTE — Progress Notes (Unsigned)
Location:  [place] Room Number: 11A Place of Service:  SNF (31) Provider:  Durenda Age, DNP, FNP-BC  Patient Care Team: [name], NP as PCP - General (Adult Health Nurse Practitioner) [name], MD as PCP - Cardiology (Cardiology)  Extended Emergency Contact Information Primary Emergency Contact: Juan [name] of [place] Phone: [phone] Mobile Phone: [phone] Relation: Daughter Secondary Emergency Contact: JuanDonna Address: [address]           [place], [place] [address] [name] of [place] Phone: [phone] Mobile Phone: [phone] Relation: Daughter  Code Status:  DNR  Goals of care: Advanced Directive information    06/21/2022   11:45 AM  Advanced Directives  Does Patient Have a Medical Advance Directive? Yes  Type of Paramedic of [place];Living will;Out of facility DNR (pink MOST or yellow form)  Does patient want to make changes to medical advance directive? No - Patient declined  Copy of [place] in Chart? Yes - validated most recent copy scanned in chart (See row information)     Chief Complaint  Patient presents with   Routine Visit    Medical management of chronic issues    HPI:  Pt is a 87 y.o. [name] seen today for medical management of chronic diseases.    Essential hypertension, benign -  SBPs ranging from 132 to 142 with outlier 151, takes Amlodipine  TIA (transient ischemic attack)  -  takes Plavix and Fenofibric, no recent TIA episode  Hearing loss of right ear, unspecified hearing loss type -  wears hearing aid on the right, reads lips of people he communicates with  Benign prostatic hyperplasia with urinary obstruction -  denies urinary concerns, takes Finasteride   Past Medical History:  Diagnosis Date   Cancer ([place])    skin   CKD (chronic kidney disease) 06/24/2013   Cochlear implant  in place    Coronary artery disease    a. s/p CABG 2000; b. myoview 9/08: inf-septal scar with mild peri-infarct ischemia (low risk);  c.  Echo 8/13: mild focal basal septal hypertrophy, Gr 1 DD, mild LAE;  d. Lexiscan Myoview (06/20/2013): No reversible ischemia, inferior and septal infarct, inferior and septal hypokinesis, EF 60%;  e.  Echo (06/20/2011): EF 50-55%, normal wall motion, grade 1 diastolic dysfunction, mild LAE   Diverticular disease    Duodenitis    Elevated PSA    GERD (gastroesophageal reflux disease)    Gout    Humerus fracture    right   Hydronephrosis with ureteropelvic junction obstruction    Hypercalcemia 02/02/2012   a. 01/2012: 10.7.   Hyperlipidemia    Hypertension    Pancreatitis    a. ? Simvastatin related   Pneumonia age 64 or 7   "I think only once" (06/19/2013)   Syncope    a. 06/2013   TIA (transient ischemic attack)    a. 01/2012 - presentation as acute imbalance;  b. 01/2012 carotid U/S w/o signif stenosis;  c. 01/2012 Echo: EF 60-65%, Gr 1 DD.;  d.  Carotid US (06/20/2013): 1-39% bilateral ICA stenosis   Tubular adenoma polyp of rectum    Type II diabetes mellitus ([place])    "diet and exercise controlled at present" (06/19/2013)   Past Surgical History:  Procedure Laterality Date   [place]  COCHLEAR IMPLANT Right 1990's   COLONOSCOPY W/ POLYPECTOMY     no polyp 2012   CORONARY ARTERY BYPASS GRAFT  2000   CABG X5   INGUINAL HERNIA REPAIR Right 09/15/2015   Procedure: OPEN REPAIR RIGHT INGUINAL HERNIA ;  Surgeon: Jackolyn Confer, MD;  Location: [facility];  Service: General;  Laterality: Right;   INSERTION OF MESH Right 09/15/2015   Procedure: INSERTION OF MESH;  Surgeon: Jackolyn Confer, MD;  Location: [facility];  Service: General;  Laterality: Right;   PARATHYROIDECTOMY  1978   POLYPECTOMY     REVERSE SHOULDER ARTHROPLASTY Right 12/30/2014   Procedure: Right shoulder ORIF, Biomet S3 Plate and ZImmer cable  system ;  Surgeon: Netta Cedars, MD;  Location: [place];  Service: Orthopedics;  Laterality: Right;   right ankle surgery  age 12   TONSILLECTOMY  33   trachestomy as child for throat closing      Allergies  Allergen Reactions   Simvastatin Other (See Comments)    Possible statin-induced pancreatitis    Outpatient Encounter Medications as of 08/07/2022  Medication Sig   acetaminophen (TYLENOL) 500 MG tablet Take 1,000 mg by mouth 2 (two) times daily as needed for mild pain.   allopurinol (ZYLOPRIM) 100 MG tablet Take 100 mg by mouth daily.   amLODipine (NORVASC) 2.5 MG tablet Take 1 tablet (2.5 mg total) by mouth daily.   Cholecalciferol 50 MCG (2000 UT) TABS Take 50 tablets by mouth in the morning. 50 mcg (2000 units) tab by mouth one time a day (Per [facility])   Choline Fenofibrate (FENOFIBRIC ACID) 135 MG CPDR Take 1 tablet by mouth in the morning.   clopidogrel (PLAVIX) 75 MG tablet Take 75 mg by mouth daily.   colchicine 0.6 MG tablet Take 0.6 mg by mouth daily as needed (as directed for gout flares).   finasteride (PROSCAR) 5 MG tablet Take 5 mg by mouth every evening.   ipratropium-albuterol (DUONEB) 0.5-2.5 (3) MG/3ML SOLN Take 3 mLs by nebulization every 6 (six) hours as needed.   metFORMIN (GLUCOPHAGE) 500 MG tablet Take 0.5 tablets (250 mg total) by mouth daily with breakfast.   zinc oxide 20 % ointment Apply 1 application  topically as needed for irritation. Apply transdermally as needed for skin irritation after each incontinent episode.   No facility-administered encounter medications on file as of 08/07/2022.    Review of Systems  Constitutional:  Negative for activity change, appetite change and fever.  HENT:  Negative for sore throat.   Eyes: Negative.   Cardiovascular:  Negative for chest pain and leg swelling.  Gastrointestinal:  Negative for abdominal distention, diarrhea and vomiting.  Genitourinary:  Negative for dysuria, frequency and urgency.  Skin:  Negative for  color change.  Neurological:  Negative for dizziness and headaches.  Psychiatric/Behavioral:  Negative for behavioral problems and sleep disturbance. The patient is not nervous/anxious.        Immunization History  Administered Date(s) Administered   Fluad Quad(high Dose 65+) 04/01/2019, 04/11/2022   Influenza Split 04/13/2011, 05/02/2012   Influenza Whole 05/22/2007, 04/12/2008, 05/02/2009, 04/14/2010   Influenza, High Dose Seasonal PF 04/17/2013, 03/16/2014, 03/05/2016, 03/18/2017   Influenza,inj,Quad PF,6+ Mos 03/02/2015, 03/18/2018   Influenza-Unspecified 03/18/2018, 04/12/2021   Moderna Covid-19 Vaccine Bivalent Booster 49yr & up 11/29/2021   Moderna SARS-COV2 Booster Vaccination 03/08/2021   Moderna Sars-Covid-2 Vaccination 06/22/2019, 07/20/2019, 05/02/2020, 11/23/2020   PPD Test 01/02/2015   Pneumococcal Conjugate-13 12/29/2015   Pneumococcal Polysaccharide-23 04/19/2006   Tdap 09/04/2016  Unspecified SARS-COV-2 Vaccination 04/24/2022   Zoster Recombinat (Shingrix) 09/17/2019, 03/29/2020   Zoster, Live 06/29/2015   Pertinent  Health Maintenance Due  Topic Date Due   HEMOGLOBIN A1C  06/28/2022   FOOT EXAM  07/28/2022   OPHTHALMOLOGY EXAM  12/01/2022   INFLUENZA VACCINE  Completed      04/12/2021    1:19 PM 01/10/2022   10:24 AM 01/10/2022   12:47 PM 04/30/2022    2:09 PM 05/23/2022    9:44 AM  Fall Risk  Falls in the past year? 1 0 1 0 0  Was there an injury with Fall? 0 0 0 0 0  Fall Risk Category Calculator 2 0 1 0 0  Fall Risk Category (Retired) Moderate Low Low Low Low  (RETIRED) Patient Fall Risk Level  Low fall risk High fall risk Low fall risk Low fall risk  Patient at Risk for Falls Due to  No Fall Risks History of fall(s);Impaired mobility No Fall Risks No Fall Risks  Fall risk Follow up  Falls evaluation completed Falls evaluation completed;Education provided;Falls prevention discussed Falls evaluation completed Falls evaluation completed     Vitals:    08/07/22 1713  BP: 139/78  Pulse: 81  Resp: 18  Temp: 97.7 F (36.5 C)  SpO2: 95%  Weight: 169 lb 11.2 oz (77 kg)  Height: 5' 9"$  (1.753 m)   Body mass index is 25.06 kg/m.  Physical Exam Constitutional:      Appearance: Normal appearance.  HENT:     Head: Normocephalic and atraumatic.     Ears:     Comments: Hearing aid on right ear    Mouth/Throat:     Mouth: Mucous membranes are moist.  Eyes:     Conjunctiva/sclera: Conjunctivae normal.  Cardiovascular:     Rate and Rhythm: Normal rate and regular rhythm.     Pulses: Normal pulses.     Heart sounds: Normal heart sounds.  Pulmonary:     Effort: Pulmonary effort is normal.     Breath sounds: Normal breath sounds.  Abdominal:     General: Bowel sounds are normal.     Palpations: Abdomen is soft.  Musculoskeletal:        General: No swelling. Normal range of motion.     Cervical back: Normal range of motion.  Skin:    General: Skin is warm and dry.  Neurological:     General: No focal deficit present.     Mental Status: He is alert and oriented to person, place, and time.  Psychiatric:        Mood and Affect: Mood normal.        Behavior: Behavior normal.        Thought Content: Thought content normal.        Judgment: Judgment normal.        Labs reviewed: Recent Labs    08/10/21 0000 08/11/21 0000 09/22/21 0000  NA 141 141 139  K 4.1 4.1 3.9  CL 108 108 106  CO2 29* 29* 26*  BUN 33* 33* 33*  CREATININE 1.2 1.2 1.4*  CALCIUM 10.7 10.7 10.4   Recent Labs    08/10/21 0000 08/11/21 0000  AST 17 17  ALT 12 12  ALKPHOS 36 36  ALBUMIN 4.4 4.4   No results for input(s): "WBC", "NEUTROABS", "HGB", "HCT", "MCV", "PLT" in the last 8760 hours. Lab Results  Component Value Date   TSH 1.79 10/26/2021   Lab Results  Component Value Date  HGBA1C 6.4 12/26/2021   Lab Results  Component Value Date   CHOL 188 09/22/2021   HDL 41 09/22/2021   LDLCALC 119 09/22/2021   LDLDIRECT 96.0 08/23/2020    TRIG 163 (A) 09/22/2021   CHOLHDL 4.4 10/21/2020    Significant Diagnostic Results in last 30 days:  No results found.  Assessment/Plan  1. Essential hypertension, benign -  stable, continue Amlodipine -  monitor BPs  2. TIA (transient ischemic attack) -  stable, continue Plavix and Fenofibric  3. Hearing loss of right ear, unspecified hearing loss type -  continue use of hearing aid  4. Benign prostatic hyperplasia with urinary obstruction -  stable, continue Finasteride  5. Type 2 diabetes mellitus with stage 3a chronic kidney disease, without long-term current use of insulin ([place]) Lab Results  Component Value Date   HGBA1C 6.4 12/26/2021   -  continue Metformin    Family/ staff Communication: Discussed plan of care with resident and charge nurse  Labs/tests ordered:  None    Durenda Age, DNP, MSN, FNP-BC [facility] [phone] (Monday-Friday 8:00 a.m. - 5:00 p.m.) [phone] (after hours)

## 2022-08-10 ENCOUNTER — Encounter: Payer: Self-pay | Admitting: Orthopedic Surgery

## 2022-08-10 ENCOUNTER — Non-Acute Institutional Stay (SKILLED_NURSING_FACILITY): Payer: Federal, State, Local not specified - PPO | Admitting: Orthopedic Surgery

## 2022-08-10 DIAGNOSIS — Z0000 Encounter for general adult medical examination without abnormal findings: Secondary | ICD-10-CM | POA: Diagnosis not present

## 2022-08-10 NOTE — Progress Notes (Signed)
Provider: Windell Moulding, NP Location:  [facility] Room Number: 11-A Place of Service:  SNF (31)   PCP: [name], NP Patient Care Team: [name], NP as PCP - General (Adult Health Nurse Practitioner) [name], MD as PCP - Cardiology (Cardiology)  Extended Emergency Contact Information Primary Emergency Contact: [place], [place] Phone: [phone] Mobile Phone: [phone] Relation: Daughter Secondary Emergency Contact: [name],[name] Address: [address]           [place], [place] [address] [name] of [place] Phone: [phone] Mobile Phone: [phone] Relation: Daughter  Code Status: DNR Goals of Care: Advanced Directive information    08/10/2022    9:36 AM  Advanced Directives  Does Patient Have a Medical Advance Directive? Yes  Type of Paramedic of [place];Living will;Out of facility DNR (pink MOST or yellow form)  Does patient want to make changes to medical advance directive? No - Patient declined  Copy of [place] in Chart? Yes - validated most recent copy scanned in chart (See row information)     Chief Complaint  Patient presents with   Medicare Wellness    Annual Wellness Visit.     HPI: Patient is a 87 y.o. male seen today for an annual comprehensive examination.  Past Medical History:  Diagnosis Date   Cancer ([place])    skin   CKD (chronic kidney disease) 06/24/2013   Cochlear implant in place    Coronary artery disease    a. s/p CABG 2000; b. myoview 9/08: inf-septal scar with mild peri-infarct ischemia (low risk);  c.  Echo 8/13: mild focal basal septal hypertrophy, Gr 1 DD, mild LAE;  d. Lexiscan Myoview (06/20/2013): No reversible ischemia, inferior and septal infarct, inferior and septal hypokinesis, EF 60%;  e.  Echo (06/20/2011): EF 50-55%, normal wall motion, grade 1 diastolic dysfunction, mild LAE   Diverticular  disease    Duodenitis    Elevated PSA    GERD (gastroesophageal reflux disease)    Gout    Humerus fracture    right   Hydronephrosis with ureteropelvic junction obstruction    Hypercalcemia 02/02/2012   a. 01/2012: 10.7.   Hyperlipidemia    Hypertension    Pancreatitis    a. ? Simvastatin related   Pneumonia age 89 or 7   "I think only once" (06/19/2013)   Syncope    a. 06/2013   TIA (transient ischemic attack)    a. 01/2012 - presentation as acute imbalance;  b. 01/2012 carotid U/S w/o signif stenosis;  c. 01/2012 Echo: EF 60-65%, Gr 1 DD.;  d.  Carotid US (06/20/2013): 1-39% bilateral ICA stenosis   Tubular adenoma polyp of rectum    Type II diabetes mellitus ([place])    "diet and exercise controlled at present" (06/19/2013)   Past Surgical History:  Procedure Laterality Date   APPENDECTOMY     CARDIAC CATHETERIZATION  2000   CHOLECYSTECTOMY     COCHLEAR IMPLANT Right 1990's   COLONOSCOPY W/ POLYPECTOMY     no polyp 2012   CORONARY ARTERY BYPASS GRAFT  2000   CABG X5   INGUINAL HERNIA REPAIR Right 09/15/2015   Procedure: OPEN REPAIR RIGHT INGUINAL HERNIA ;  Surgeon: Jackolyn Confer, MD;  Location: [facility];  Service: General;  Laterality: Right;   INSERTION OF MESH Right 09/15/2015   Procedure: INSERTION OF MESH;  Surgeon:  Jackolyn Confer, MD;  Location: [facility];  Service: General;  Laterality: Right;   PARATHYROIDECTOMY  1978   POLYPECTOMY     REVERSE SHOULDER ARTHROPLASTY Right 12/30/2014   Procedure: Right shoulder ORIF, Biomet S3 Plate and ZImmer cable system ;  Surgeon: Netta Cedars, MD;  Location: [facility];  Service: Orthopedics;  Laterality: Right;   right ankle surgery  age 35   TONSILLECTOMY  1943   trachestomy as child for throat closing      reports that he has quit smoking. His smoking use included cigars. He has never used smokeless tobacco. He reports current alcohol use. He reports that he does not use drugs. Social History   Socioeconomic History   Marital status: Widowed     Spouse name: Not on file   Number of children: 2   Years of education: college   Highest education level: Not on file  Occupational History   Occupation: retired    Fish farm manager: RETIRED  Tobacco Use   Smoking status: Former    Types: Cigars   Smokeless tobacco: Never   Tobacco comments:    06/19/2013 "rare cigar", 12/07/20 quit  Vaping Use   Vaping Use: Never used  Substance and Sexual Activity   Alcohol use: Yes    Comment: 12/07/20 1-2 a week   Drug use: No   Sexual activity: Not Currently    Birth control/protection: Injection  Other Topics Concern   Not on file  Social History Narrative   12/07/20 lives at  [place] area   Does get regular exercise   No caffeine   Social Determinants of Health   Financial Resource Strain: Low Risk  (01/10/2022)   Overall Financial Resource Strain (CARDIA)    Difficulty of Paying Living Expenses: Not hard at all  Food Insecurity: No [place] (01/10/2022)   Hunger Vital Sign    Worried About Running Out of Food in the Last Year: Never true    [place] in the Last Year: Never true  Transportation Needs: No Transportation Needs (01/10/2022)   PRAPARE - Hydrologist (Medical): No    Lack of Transportation (Non-Medical): No  Physical Activity: Insufficiently Active (01/10/2022)   Exercise Vital Sign    Days of Exercise per Week: 7 days    Minutes of Exercise per Session: 10 min  Stress: No Stress Concern Present (01/10/2022)   [place]    Feeling of Stress : Not at all  Social Connections: Socially Isolated (01/10/2022)   Social Connection and Isolation Panel [NHANES]    Frequency of Communication with Friends and Family: More than three times a week    Frequency of Social Gatherings with Friends and Family: More than three times a week    Attends Religious Services: Never    Marine scientist or Organizations: No     Attends Archivist Meetings: Never    Marital Status: Widowed  Intimate Partner Violence: Not At Risk (01/10/2022)   Humiliation, Afraid, Rape, and Kick questionnaire    Fear of Current or Ex-Partner: No    Emotionally Abused: No    Physically Abused: No    Sexually Abused: No   Family History  Problem Relation Age of Onset   Diabetes Mother    Melanoma Father    Heart attack Father         in 51s    Pertinent  Health Maintenance  Due  Topic Date Due   HEMOGLOBIN A1C  06/28/2022   FOOT EXAM  07/28/2022   OPHTHALMOLOGY EXAM  12/01/2022   INFLUENZA VACCINE  Completed      04/12/2021    1:19 PM 01/10/2022   10:24 AM 01/10/2022   12:47 PM 04/30/2022    2:09 PM 05/23/2022    9:44 AM  Fall Risk  Falls in the past year? 1 0 1 0 0  Was there an injury with Fall? 0 0 0 0 0  Fall Risk Category Calculator 2 0 1 0 0  Fall Risk Category (Retired) Moderate Low Low Low Low  (RETIRED) Patient Fall Risk Level  Low fall risk High fall risk Low fall risk Low fall risk  Patient at Risk for Falls Due to  No Fall Risks History of fall(s);Impaired mobility No Fall Risks No Fall Risks  Fall risk Follow up  Falls evaluation completed Falls evaluation completed;Education provided;Falls prevention discussed Falls evaluation completed Falls evaluation completed      05/23/2022    9:45 AM 01/10/2022   12:45 PM 01/09/2021   11:49 AM 01/19/2020    9:55 AM 01/15/2019    9:47 AM  Depression screen PHQ 2/9  Decreased Interest 0 0 0 0 0  Down, Depressed, Hopeless 0 0 0 0 0  PHQ - 2 Score 0 0 0 0 0    Functional Status Survey:    Allergies  Allergen Reactions   Simvastatin Other (See Comments)    Possible statin-induced pancreatitis    Allergies as of 08/10/2022       Reactions   Simvastatin Other (See Comments)   Possible statin-induced pancreatitis        Medication List        Accurate as of August 10, 2022  9:36 AM. If you have any questions, ask your nurse or doctor.           STOP taking these medications    Cholecalciferol 50 MCG (2000 UT) Tabs Stopped by: [name], NP       TAKE these medications    acetaminophen 500 MG tablet Commonly known as: TYLENOL Take 1,000 mg by mouth 2 (two) times daily.   allopurinol 100 MG tablet Commonly known as: ZYLOPRIM Take 100 mg by mouth daily.   amLODipine 2.5 MG tablet Commonly known as: NORVASC Take 1 tablet (2.5 mg total) by mouth daily.   clopidogrel 75 MG tablet Commonly known as: PLAVIX Take 75 mg by mouth daily.   colchicine 0.6 MG tablet Take 0.6 mg by mouth daily as needed (as directed for gout flares).   Fenofibric Acid 135 MG Cpdr Take 1 tablet by mouth in the morning.   finasteride 5 MG tablet Commonly known as: PROSCAR Take 5 mg by mouth every evening.   ipratropium-albuterol 0.5-2.5 (3) MG/3ML Soln Commonly known as: DUONEB Take 3 mLs by nebulization every 6 (six) hours as needed.   metFORMIN 500 MG tablet Commonly known as: GLUCOPHAGE Take 0.5 tablets (250 mg total) by mouth daily with breakfast.   zinc oxide 20 % ointment Apply 1 application  topically as needed for irritation. Apply transdermally as needed for skin irritation after each incontinent episode.        Review of Systems  Vitals:   08/10/22 0933  BP: 139/78  Pulse: 85  Resp: 20  Temp: 97.8 F (36.6 C)  SpO2: 95%  Weight: 170 lb (77.1 kg)  Height: '5\' 9"'$  (1.753 m)   Body mass  index is 25.1 kg/m. Physical Exam  Labs reviewed: Basic Metabolic Panel: Recent Labs    08/11/21 0000 09/22/21 0000  NA 141 139  K 4.1 3.9  CL 108 106  CO2 29* 26*  BUN 33* 33*  CREATININE 1.2 1.4*  CALCIUM 10.7 10.4   Liver Function Tests: Recent Labs    08/11/21 0000  AST 17  ALT 12  ALKPHOS 36  ALBUMIN 4.4   No results for input(s): "LIPASE", "AMYLASE" in the last 8760 hours. No results for input(s): "AMMONIA" in the last 8760 hours. CBC: No results for input(s): "WBC", "NEUTROABS", "HGB", "HCT",  "MCV", "PLT" in the last 8760 hours. Cardiac Enzymes: No results for input(s): "CKTOTAL", "CKMB", "CKMBINDEX", "TROPONINI" in the last 8760 hours. BNP: Invalid input(s): "POCBNP" Lab Results  Component Value Date   HGBA1C 6.4 12/26/2021   Lab Results  Component Value Date   TSH 1.79 10/26/2021   No results found for: "VITAMINB12" No results found for: "FOLATE" No results found for: "IRON", "TIBC", "FERRITIN"  Imaging and Procedures obtained recently: No results found.  Assessment/Plan There are no diagnoses linked to this encounter.   Family/ staff Communication:   Labs/tests ordered:

## 2022-08-10 NOTE — Progress Notes (Signed)
Subjective:   Juan [name] is a 87 y.o. male who presents for Medicare Annual/Subsequent preventive examination.  Place of Service: [facility] nursing unit Provider: Windell Moulding, AGNP-C   Review of Systems     Cardiac Risk Factors include: advanced age (>7mn, >>84women);hypertension;male gender;sedentary lifestyle     Objective:    Today's Vitals   08/10/22 0933  BP: 139/78  Pulse: 85  Resp: 20  Temp: 97.8 F (36.6 C)  SpO2: 95%  Weight: 170 lb (77.1 kg)  Height: '5\' 9"'$  (1.753 m)   Body mass index is 25.1 kg/m.     08/10/2022    9:36 AM 06/21/2022   11:45 AM 05/23/2022    9:48 AM 04/30/2022    2:09 PM 04/17/2022    3:12 PM 03/29/2022    1:31 PM 01/19/2022   10:40 AM  Advanced Directives  Does Patient Have a Medical Advance Directive? Yes Yes Yes Yes Yes Yes Yes  Type of AParamedicof APauls ValleyLiving will;Out of facility DNR (pink MOST or yellow form) HHato ArribaLiving will;Out of facility DNR (pink MOST or yellow form) HTemple HillsLiving will;Out of facility DNR (pink MOST or yellow form) HThe ColonyLiving will;Out of facility DNR (pink MOST or yellow form) HR[place]Living will;Out of facility DNR (pink MOST or yellow form) HW[place]Living will;Out of facility DNR (pink MOST or yellow form) HWest [place]Living will;Out of facility DNR (pink MOST or yellow form)  Does patient want to make changes to medical advance directive? No - Patient declined No - Patient declined No - Patient declined No - Patient declined No - Patient declined No - Patient declined No - Patient declined  Copy of HLeo[place]Cedarvillein Chart? Yes - validated most recent copy scanned in chart (See row information) Yes - validated most recent copy scanned in chart (See row information) Yes - validated most recent copy scanned in chart (See row  information) Yes - validated most recent copy scanned in chart (See row information) Yes - validated most recent copy scanned in chart (See row information) Yes - validated most recent copy scanned in chart (See row information) Yes - validated most recent copy scanned in chart (See row information)  Pre-existing out of facility DNR order (yellow form or pink MOST form)   Yellow form placed in chart (order not valid for inpatient use) Yellow form placed in chart (order not valid for inpatient use) Yellow form placed in chart (order not valid for inpatient use) Yellow form placed in chart (order not valid for inpatient use) Yellow form placed in chart (order not valid for inpatient use)    Current Medications (verified) Outpatient Encounter Medications as of 08/10/2022  Medication Sig   acetaminophen (TYLENOL) 500 MG tablet Take 1,000 mg by mouth 2 (two) times daily.   allopurinol (ZYLOPRIM) 100 MG tablet Take 100 mg by mouth daily.   amLODipine (NORVASC) 2.5 MG tablet Take 1 tablet (2.5 mg total) by mouth daily.   Choline Fenofibrate (FENOFIBRIC ACID) 135 MG CPDR Take 1 tablet by mouth in the morning.   clopidogrel (PLAVIX) 75 MG tablet Take 75 mg by mouth daily.   colchicine 0.6 MG tablet Take 0.6 mg by mouth daily as needed (as directed for gout flares).   finasteride (PROSCAR) 5 MG tablet Take 5 mg by mouth every evening.   ipratropium-albuterol (DUONEB) 0.5-2.5 (3) MG/3ML SOLN Take 3 mLs  by nebulization every 6 (six) hours as needed.   metFORMIN (GLUCOPHAGE) 500 MG tablet Take 0.5 tablets (250 mg total) by mouth daily with breakfast.   zinc oxide 20 % ointment Apply 1 application  topically as needed for irritation. Apply transdermally as needed for skin irritation after each incontinent episode.   [DISCONTINUED] Cholecalciferol 50 MCG (2000 UT) TABS Take 50 tablets by mouth in the morning. 50 mcg (2000 units) tab by mouth one time a day (Per [facility])   No facility-administered encounter  medications on file as of 08/10/2022.    Allergies (verified) Simvastatin   History: Past Medical History:  Diagnosis Date   Cancer ([place])    skin   CKD (chronic kidney disease) 06/24/2013   Cochlear implant in place    Coronary artery disease    a. s/p CABG 2000; b. myoview 9/08: inf-septal scar with mild peri-infarct ischemia (low risk);  c.  Echo 8/13: mild focal basal septal hypertrophy, Gr 1 DD, mild LAE;  d. Lexiscan Myoview (06/20/2013): No reversible ischemia, inferior and septal infarct, inferior and septal hypokinesis, EF 60%;  e.  Echo (06/20/2011): EF 50-55%, normal wall motion, grade 1 diastolic dysfunction, mild LAE   Diverticular disease    Duodenitis    Elevated PSA    GERD (gastroesophageal reflux disease)    Gout    Humerus fracture    right   Hydronephrosis with ureteropelvic junction obstruction    Hypercalcemia 02/02/2012   a. 01/2012: 10.7.   Hyperlipidemia    Hypertension    Pancreatitis    a. ? Simvastatin related   Pneumonia age 27 or 7   "I think only once" (06/19/2013)   Syncope    a. 06/2013   TIA (transient ischemic attack)    a. 01/2012 - presentation as acute imbalance;  b. 01/2012 carotid U/S w/o signif stenosis;  c. 01/2012 Echo: EF 60-65%, Gr 1 DD.;  d.  Carotid US (06/20/2013): 1-39% bilateral ICA stenosis   Tubular adenoma polyp of rectum    Type II diabetes mellitus ([place])    "diet and exercise controlled at present" (06/19/2013)   Past Surgical History:  Procedure Laterality Date   APPENDECTOMY     CARDIAC CATHETERIZATION  2000   CHOLECYSTECTOMY     COCHLEAR IMPLANT Right 1990's   COLONOSCOPY W/ POLYPECTOMY     no polyp 2012   CORONARY ARTERY BYPASS GRAFT  2000   CABG X5   INGUINAL HERNIA REPAIR Right 09/15/2015   Procedure: OPEN REPAIR RIGHT INGUINAL HERNIA ;  Surgeon: Jackolyn Confer, MD;  Location: [facility];  Service: General;  Laterality: Right;   INSERTION OF MESH Right 09/15/2015   Procedure: INSERTION OF MESH;  Surgeon: Jackolyn Confer, MD;   Location: [facility];  Service: General;  Laterality: Right;   PARATHYROIDECTOMY  1978   POLYPECTOMY     REVERSE SHOULDER ARTHROPLASTY Right 12/30/2014   Procedure: Right shoulder ORIF, Biomet S3 Plate and ZImmer cable system ;  Surgeon: Netta Cedars, MD;  Location: [facility];  Service: Orthopedics;  Laterality: Right;   right ankle surgery  age 23   TONSILLECTOMY  52   trachestomy as child for throat closing     Family History  Problem Relation Age of Onset   Diabetes Mother    Melanoma Father    Heart attack Father         in 24s   Social History   Socioeconomic History   Marital status: Widowed    Spouse name: Not  on file   Number of children: 2   Years of education: college   Highest education level: Not on file  Occupational History   Occupation: retired    Fish farm manager: RETIRED  Tobacco Use   Smoking status: Former    Types: Cigars   Smokeless tobacco: Never   Tobacco comments:    06/19/2013 "rare cigar", 12/07/20 quit  Vaping Use   Vaping Use: Never used  Substance and Sexual Activity   Alcohol use: Yes    Comment: 12/07/20 1-2 a week   Drug use: No   Sexual activity: Not Currently    Birth control/protection: Injection  Other Topics Concern   Not on file  Social History Narrative   12/07/20 lives at  [place]   Does get regular exercise   No caffeine   Social Determinants of Health   Financial Resource Strain: Low Risk  (08/10/2022)   Overall Financial Resource Strain (CARDIA)    Difficulty of Paying Living Expenses: Not hard at all  Food Insecurity: No Food Insecurity (08/10/2022)   Hunger Vital Sign    Worried About Running Out of Food in the Last Year: Never true    [place] in the Last Year: Never true  Transportation Needs: No Transportation Needs (08/10/2022)   PRAPARE - Transportation    Lack of Transportation (Medical): No    Lack of Transportation (Non-Medical): No  Physical Activity: Insufficiently Active (08/10/2022)   Exercise Vital  Sign    Days of Exercise per Week: 7 days    Minutes of Exercise per Session: 10 min  Stress: No Stress Concern Present (08/10/2022)   [place]    Feeling of Stress : Not at all  Social Connections: Socially Isolated (08/10/2022)   Social Connection and Isolation Panel [NHANES]    Frequency of Communication with Friends and Family: More than three times a week    Frequency of Social Gatherings with Friends and Family: More than three times a week    Attends Religious Services: Never    Marine scientist or Organizations: No    Attends Archivist Meetings: Never    Marital Status: Widowed    Tobacco Counseling Counseling given: Not Answered Tobacco comments: 06/19/2013 "rare cigar", 12/07/20 quit   Clinical Intake:  Pre-visit preparation completed: No  Pain : No/denies pain     BMI - recorded: 25.1 Nutritional Status: BMI 25 -29 Overweight Nutritional Risks: None Diabetes: Yes (167 08/06/2022) CBG done?: Yes CBG resulted in Enter/ Edit results?: No Did pt. bring in CBG monitor from home?: No  How often do you need to have someone help you when you read instructions, pamphlets, or other written materials from your doctor or pharmacy?: 3 - Sometimes What is the last grade level you completed in school?: college  Diabetic?Yes  Interpreter Needed?: No      Activities of Daily Living    08/10/2022    3:12 PM 01/10/2022   12:47 PM  In your present state of health, do you have any difficulty performing the following activities:  Hearing? 1 1  Vision? 0 0  Difficulty concentrating or making decisions? 0 0  Walking or climbing stairs? 1 1  Dressing or bathing? 1 1  Doing errands, shopping? 1 1  Preparing Food and eating ? Y Y  Using the Toilet? Y Y  In the past six months, have you accidently leaked urine? Tempie Donning  Do  you have problems with loss of bowel control? Y N  Managing your  Medications? Y Y  Managing your Finances? Tempie Donning  Housekeeping or managing your Housekeeping? Tempie Donning    Patient Care Team: [name], NP as PCP - General (Adult Health Nurse Practitioner) [name], MD as PCP - Cardiology (Cardiology)  Indicate any recent Medical Services you may have received from other than [facility] providers in the past year (date may be approximate).     Assessment:   This is a routine wellness examination for Juan [name].  Hearing/Vision screen No results found.  Dietary issues and exercise activities discussed: Current Exercise Habits: The patient does not participate in regular exercise at present, Exercise limited by: neurologic condition(s);orthopedic condition(s);cardiac condition(s)   Goals Addressed             This Visit's Progress    Maintain Mobility and Function   On track    Evidence-based guidance:  Emphasize the importance of physical activity and aerobic exercise as included in treatment plan; assess barriers to adherence; consider patient's abilities and preferences.  Encourage gradual increase in activity or exercise instead of stopping if pain occurs.  Reinforce individual therapy exercise prescription, such as strengthening, stabilization and stretching programs.  Promote optimal body mechanics to stabilize the spine with lifting and functional activity.  Encourage activity and mobility modifications to facilitate optimal function, such as using a log roll for bed mobility or dressing from a seated position.  Reinforce individual adaptive equipment recommendations to limit excessive spinal movements, such as a Systems analyst.  Assess adequacy of sleep; encourage use of sleep hygiene techniques, such as bedtime routine; use of white noise; dark, cool bedroom; avoiding daytime naps, heavy meals or exercise before bedtime.  Promote positions and modification to optimize sleep and sexual activity; consider pillows or positioning devices to  assist in maintaining neutral spine.  Explore options for applying ergonomic principles at work and home, such as frequent position changes, using ergonomically designed equipment and working at optimal height.  Promote modifications to increase comfort with driving such as lumbar support, optimizing seat and steering wheel position, using cruise control and taking frequent rest stops to stretch and walk.   Notes:        Depression Screen    08/10/2022    3:11 PM 05/23/2022    9:45 AM 01/10/2022   12:45 PM 01/09/2021   11:49 AM 01/19/2020    9:55 AM 01/15/2019    9:47 AM 03/18/2017    4:19 PM  PHQ 2/9 Scores  PHQ - 2 Score 0 0 0 0 0 0 0    Fall Risk    08/10/2022    3:12 PM 05/23/2022    9:44 AM 04/30/2022    2:09 PM 01/10/2022   12:47 PM 01/10/2022   10:24 AM  [place] in the past year? 0 0 0 1 0  Number falls in past yr: 0 0 0 0 0  Injury with Fall? 0 0 0 0 0  Risk for fall due to : History of fall(s);Impaired balance/gait;Impaired mobility No Fall Risks No Fall Risks History of fall(s);Impaired mobility No Fall Risks  Follow up Falls evaluation completed;Education provided Falls evaluation completed Falls evaluation completed Falls evaluation completed;Education provided;Falls prevention discussed Falls evaluation completed    FALL RISK PREVENTION PERTAINING TO THE HOME:  Any stairs in or around the home? No  If so, are there any without handrails? No  Home free of  loose throw rugs in walkways, pet beds, electrical cords, etc? Yes  Adequate lighting in your home to reduce risk of falls? Yes   ASSISTIVE DEVICES UTILIZED TO PREVENT FALLS:  Life alert? No  Use of a cane, walker or w/c? Yes  Grab bars in the bathroom? Yes  Shower chair or bench in shower? Yes  Elevated toilet seat or a handicapped toilet? Yes   TIMED UP AND GO:  Was the test performed? No .  Length of time to ambulate 10 feet: N/A sec.   Gait slow and steady with assistive device  Cognitive  Function:    08/10/2022    3:13 PM 01/10/2022   12:48 PM 04/12/2021    1:24 PM 01/09/2021   11:55 AM  MMSE - Mini Mental State Exam  Not completed:    Unable to complete  Orientation to time '5 4 5   '$ Orientation to Place '5 5 4   '$ Registration '3 3 3   '$ Attention/ Calculation '5 5 2   '$ Recall '3 3 2   '$ Language- name 2 objects '2 2 2   '$ Language- repeat 1 1 0   Language- follow 3 step command '3 3 2   '$ Language- read & follow direction '1 1 1   '$ Write a sentence '1 1 1   '$ Copy design 1 1 0   Total score '30 29 22         '$ 01/10/2022   12:49 PM 01/09/2021   11:56 AM  6CIT Screen  What Year? 0 points 0 points  What month? 0 points 0 points  What time? 3 points 3 points  Count back from 20 2 points 2 points  Months in reverse 0 points 2 points  Repeat phrase 2 points 6 points  Total Score 7 points 13 points    Immunizations Immunization History  Administered Date(s) Administered   Fluad Quad(high Dose 65+) 04/01/2019, 04/11/2022   Influenza Split 04/13/2011, 05/02/2012   Influenza Whole 05/22/2007, 04/12/2008, 05/02/2009, 04/14/2010   Influenza, High Dose Seasonal PF 04/17/2013, 03/16/2014, 03/05/2016, 03/18/2017   Influenza,inj,Quad PF,6+ Mos 03/02/2015, 03/18/2018   Influenza-Unspecified 03/18/2018, 04/12/2021   Moderna Covid-19 Vaccine Bivalent Booster 52yr & up 11/29/2021   Moderna SARS-COV2 Booster Vaccination 03/08/2021   Moderna Sars-Covid-2 Vaccination 06/22/2019, 07/20/2019, 05/02/2020, 11/23/2020   PPD Test 01/02/2015   Pneumococcal Conjugate-13 12/29/2015   Pneumococcal Polysaccharide-23 04/19/2006   Respiratory Syncytial Virus Vaccine,Recomb Aduvanted(Arexvy) 05/14/2022   Tdap 09/04/2016   Unspecified SARS-COV-2 Vaccination 04/24/2022   Zoster Recombinat (Shingrix) 09/17/2019, 03/29/2020   Zoster, Live 06/29/2015    TDAP status: Up to date  Flu Vaccine status: Up to date  Pneumococcal vaccine status: Up to date  Covid-19 vaccine status: Completed  vaccines  Qualifies for Shingles Vaccine? Yes   Zostavax completed Yes   Shingrix Completed?: Yes  Screening Tests Health Maintenance  Topic Date Due   COVID-19 Vaccine (7 - 2023-24 season) 06/19/2022   HEMOGLOBIN A1C  06/28/2022   FOOT EXAM  07/28/2022   Diabetic kidney evaluation - eGFR measurement  09/23/2022   OPHTHALMOLOGY EXAM  12/01/2022   Diabetic kidney evaluation - Urine ACR  04/28/2023   DTaP/Tdap/Td (2 - Td or Tdap) 09/05/2026   Pneumonia Vaccine 87 Years old  Completed   INFLUENZA VACCINE  Completed   Zoster Vaccines- Shingrix  Completed   HPV VACCINES  Aged Out    Health Maintenance  Health Maintenance Due  Topic Date Due   COVID-19 Vaccine (7 - 2023-24 season) 06/19/2022  HEMOGLOBIN A1C  06/28/2022   FOOT EXAM  07/28/2022    Colorectal cancer screening: No longer required.   Lung Cancer Screening: (Low Dose CT Chest recommended if Age 2-80 years, 30 pack-year currently smoking OR have quit w/in 15years.) does not qualify.   Lung Cancer Screening Referral: No  Additional Screening:  Hepatitis C Screening: does not qualify; Completed   Vision Screening: Recommended annual ophthalmology exams for early detection of glaucoma and other disorders of the eye. Is the patient up to date with their annual eye exam?  Yes  Who is the provider or what is the name of the office in which the patient attends annual eye exams? Dr. Gershon Crane If pt is not established with a provider, would they like to be referred to a provider to establish care? No .   Dental Screening: Recommended annual dental exams for proper oral hygiene  Community Resource Referral / Chronic Care Management: CRR required this visit?  No   CCM required this visit?  No      Plan:     I have personally reviewed and noted the following in the patient's chart:   Medical and social history Use of alcohol, tobacco or illicit drugs  Current medications and supplements including opioid  prescriptions. Patient is not currently taking opioid prescriptions. Functional ability and status Nutritional status Physical activity Advanced directives List of other physicians Hospitalizations, surgeries, and ER visits in previous 12 months Vitals Screenings to include cognitive, depression, and falls Referrals and appointments  In addition, I have reviewed and discussed with patient certain preventive protocols, quality metrics, and best practice recommendations. A written personalized care plan for preventive services as well as general preventive health recommendations were provided to patient.     [name], NP   08/10/2022   Nurse Notes: none

## 2022-09-05 ENCOUNTER — Encounter: Payer: Self-pay | Admitting: Orthopedic Surgery

## 2022-09-05 ENCOUNTER — Non-Acute Institutional Stay (SKILLED_NURSING_FACILITY): Payer: Federal, State, Local not specified - PPO | Admitting: Orthopedic Surgery

## 2022-09-05 DIAGNOSIS — H905 Unspecified sensorineural hearing loss: Secondary | ICD-10-CM

## 2022-09-05 DIAGNOSIS — I10 Essential (primary) hypertension: Secondary | ICD-10-CM

## 2022-09-05 DIAGNOSIS — E1122 Type 2 diabetes mellitus with diabetic chronic kidney disease: Secondary | ICD-10-CM

## 2022-09-05 DIAGNOSIS — N401 Enlarged prostate with lower urinary tract symptoms: Secondary | ICD-10-CM

## 2022-09-05 DIAGNOSIS — N138 Other obstructive and reflux uropathy: Secondary | ICD-10-CM

## 2022-09-05 DIAGNOSIS — E782 Mixed hyperlipidemia: Secondary | ICD-10-CM

## 2022-09-05 DIAGNOSIS — G459 Transient cerebral ischemic attack, unspecified: Secondary | ICD-10-CM | POA: Diagnosis not present

## 2022-09-05 DIAGNOSIS — Z8739 Personal history of other diseases of the musculoskeletal system and connective tissue: Secondary | ICD-10-CM

## 2022-09-05 DIAGNOSIS — N1831 Chronic kidney disease, stage 3a: Secondary | ICD-10-CM

## 2022-09-05 DIAGNOSIS — Z951 Presence of aortocoronary bypass graft: Secondary | ICD-10-CM | POA: Diagnosis not present

## 2022-09-05 DIAGNOSIS — R413 Other amnesia: Secondary | ICD-10-CM

## 2022-09-05 NOTE — Progress Notes (Signed)
Location:   [facility] Room Number: [place] of Service:  SNF (31) Provider:  Windell Moulding, NP    Patient Care Team: [name], NP as PCP - General (Adult Health Nurse Practitioner) [name], MD as PCP - Cardiology (Cardiology)  Extended Emergency Contact Information Primary Emergency Contact: [name],[name]          [place], [place] Phone: [phone] Mobile Phone: [phone] Relation: Daughter Secondary Emergency Contact: [name],[name] Address: [address]          [place], [place] [address] [name] of [place] Phone: [phone] Mobile Phone: [phone] Relation: Daughter  Code Status:  DNR Goals of care: Advanced Directive information    09/05/2022    9:05 AM  Advanced Directives  Does Patient Have a Medical Advance Directive? Yes  Type of Paramedic of [place];Living will;Out of facility DNR (pink MOST or yellow form)  Does patient want to make changes to medical advance directive? No - Patient declined  Copy of [place] in Chart? Yes - validated most recent copy scanned in chart (See row information)     Chief Complaint  Patient presents with   Medical Management of Chronic Issues    Routine Visit.    Health Maintenance    Discuss the need for Hemoglobin A1C, Foot exam, and Diabetic kidney evaluation.    HPI:  Pt is a 87 y.o. male seen today for medical management of chronic diseases.   He currently resides on the skilled nursing unit at [facility]. PMH: CAD, s/p CABG 2000, HTN, TIA 2022, T2DM, HOH- cochlear implant, OA, BPH, CKD, memory deficit, shuffling gait, and weight loss.     T2DM- A1c 7.2 (11/09)> was 6.4 12/26/2021, blood sugars 150-170's- no hypoglycemias, urine microalbumin 31 04/27/2022, eye exam 11/30/2021, regular diet, remains on metformin Sensory hearing loss- followed by Dr. Owens Shark- seen 04/13/2022- f/u in 3 years, right  cochlear implant in place HxTIA- no further workup per neurology, remains on plavix and fenofibrate H/o CABG- evaluated by cardiology, 10/2020 LVEF 60-65%, remains on plavix HTN- BUN/creat 33/1.4 09/2021, remains on amlodipine HLD- LDL 75 10/2020, allergic to statins CKD- see above Memory deficit- BIMS score 15/15 03/2022, CT head 12/2020 atrophy and chronic microvascular ischemic changes noted, very pleasant, no behaviors Hx of gout- uric acid 7.4 02/23, no recent flares, remains on allopurinol and colchicine prn BPH- remains on finasteride   Past Medical History:  Diagnosis Date   Cancer ([place])    skin   CKD (chronic kidney disease) 06/24/2013   Cochlear implant in place    Coronary artery disease    a. s/p CABG 2000; b. myoview 9/08: inf-septal scar with mild peri-infarct ischemia (low risk);  c.  Echo 8/13: mild focal basal septal hypertrophy, Gr 1 DD, mild LAE;  d. Lexiscan Myoview (06/20/2013): No reversible ischemia, inferior and septal infarct, inferior and septal hypokinesis, EF 60%;  e.  Echo (06/20/2011): EF 50-55%, normal wall motion, grade 1 diastolic dysfunction, mild LAE   Diverticular disease    Duodenitis    Elevated PSA    GERD (gastroesophageal reflux disease)    Gout    Humerus fracture    right   Hydronephrosis with ureteropelvic junction obstruction    Hypercalcemia 02/02/2012   a. 01/2012: 10.7.   Hyperlipidemia    Hypertension    Pancreatitis    a. ? Simvastatin related   Pneumonia age 54 or 66   "  I think only once" (06/19/2013)   Syncope    a. 06/2013   TIA (transient ischemic attack)    a. 01/2012 - presentation as acute imbalance;  b. 01/2012 carotid U/S w/o signif stenosis;  c. 01/2012 Echo: EF 60-65%, Gr 1 DD.;  d.  Carotid US (06/20/2013): 1-39% bilateral ICA stenosis   Tubular adenoma polyp of rectum    Type II diabetes mellitus ([place])    "diet and exercise controlled at present" (06/19/2013)   Past Surgical History:  Procedure Laterality Date   APPENDECTOMY      CARDIAC CATHETERIZATION  2000   CHOLECYSTECTOMY     COCHLEAR IMPLANT Right 1990's   COLONOSCOPY W/ POLYPECTOMY     no polyp 2012   CORONARY ARTERY BYPASS GRAFT  2000   CABG X5   INGUINAL HERNIA REPAIR Right 09/15/2015   Procedure: OPEN REPAIR RIGHT INGUINAL HERNIA ;  Surgeon: Jackolyn Confer, MD;  Location: [facility];  Service: General;  Laterality: Right;   INSERTION OF MESH Right 09/15/2015   Procedure: INSERTION OF MESH;  Surgeon: Jackolyn Confer, MD;  Location: [facility];  Service: General;  Laterality: Right;   PARATHYROIDECTOMY  1978   POLYPECTOMY     REVERSE SHOULDER ARTHROPLASTY Right 12/30/2014   Procedure: Right shoulder ORIF, Biomet S3 Plate and ZImmer cable system ;  Surgeon: Netta Cedars, MD;  Location: [facility];  Service: Orthopedics;  Laterality: Right;   right ankle surgery  age 57   TONSILLECTOMY  67   trachestomy as child for throat closing      Allergies  Allergen Reactions   Simvastatin Other (See Comments)    Possible statin-induced pancreatitis    Allergies as of 09/05/2022       Reactions   Simvastatin Other (See Comments)   Possible statin-induced pancreatitis        Medication List        Accurate as of September 05, 2022  9:05 AM. If you have any questions, ask your nurse or doctor.          acetaminophen 500 MG tablet Commonly known as: TYLENOL Take 1,000 mg by mouth 2 (two) times daily.   allopurinol 100 MG tablet Commonly known as: ZYLOPRIM Take 100 mg by mouth daily.   amLODipine 2.5 MG tablet Commonly known as: NORVASC Take 1 tablet (2.5 mg total) by mouth daily.   clopidogrel 75 MG tablet Commonly known as: PLAVIX Take 75 mg by mouth daily.   colchicine 0.6 MG tablet Take 0.6 mg by mouth daily as needed (as directed for gout flares).   Fenofibric Acid 135 MG Cpdr Take 1 tablet by mouth in the morning.   finasteride 5 MG tablet Commonly known as: PROSCAR Take 5 mg by mouth every evening.   ipratropium-albuterol 0.5-2.5 (3)  MG/3ML Soln Commonly known as: DUONEB Take 3 mLs by nebulization every 6 (six) hours as needed.   metFORMIN 500 MG tablet Commonly known as: GLUCOPHAGE Take 0.5 tablets (250 mg total) by mouth daily with breakfast.   zinc oxide 20 % ointment Apply 1 application  topically as needed for irritation. Apply transdermally as needed for skin irritation after each incontinent episode.        Review of Systems  Constitutional:  Negative for activity change and appetite change.  HENT:  Positive for hearing loss. Negative for congestion and trouble swallowing.   Eyes:  Negative for visual disturbance.  Respiratory:  Negative for cough, shortness of breath and wheezing.   Cardiovascular:  Negative  for chest pain and leg swelling.  Gastrointestinal:  Negative for abdominal distention and abdominal pain.  Genitourinary:  Negative for dysuria, frequency and hematuria.       BPH, urinary incontinence  Musculoskeletal:  Positive for gait problem.  Skin:  Negative for wound.  Neurological:  Positive for weakness. Negative for dizziness and headaches.  Psychiatric/Behavioral:  Positive for confusion. Negative for dysphoric mood and sleep disturbance. The patient is not nervous/anxious.     Immunization History  Administered Date(s) Administered   Fluad Quad(high Dose 65+) 04/01/2019, 04/11/2022   Influenza Split 04/13/2011, 05/02/2012   Influenza Whole 05/22/2007, 04/12/2008, 05/02/2009, 04/14/2010   Influenza, High Dose Seasonal PF 04/17/2013, 03/16/2014, 03/05/2016, 03/18/2017   Influenza,inj,Quad PF,6+ Mos 03/02/2015, 03/18/2018   Influenza-Unspecified 03/18/2018, 04/12/2021   Moderna Covid-19 Vaccine Bivalent Booster 25yrs & up 11/29/2021   Moderna SARS-COV2 Booster Vaccination 03/08/2021   Moderna Sars-Covid-2 Vaccination 06/22/2019, 07/20/2019, 05/02/2020, 11/23/2020   PPD Test 01/02/2015   Pneumococcal Conjugate-13 12/29/2015   Pneumococcal Polysaccharide-23 04/19/2006   Respiratory  Syncytial Virus Vaccine,Recomb Aduvanted(Arexvy) 05/14/2022   Tdap 09/04/2016   Unspecified SARS-COV-2 Vaccination 04/24/2022   Zoster Recombinat (Shingrix) 09/17/2019, 03/29/2020   Zoster, Live 06/29/2015   Pertinent  Health Maintenance Due  Topic Date Due   HEMOGLOBIN A1C  06/28/2022   FOOT EXAM  07/28/2022   OPHTHALMOLOGY EXAM  12/01/2022   INFLUENZA VACCINE  Completed      01/10/2022   10:24 AM 01/10/2022   12:47 PM 04/30/2022    2:09 PM 05/23/2022    9:44 AM 08/10/2022    3:12 PM  Fall Risk  Falls in the past year? 0 1 0 0 0  Was there an injury with Fall? 0 0 0 0 0  Fall Risk Category Calculator 0 1 0 0 0  Fall Risk Category (Retired) Low Low Low Low   (RETIRED) Patient Fall Risk Level Low fall risk High fall risk Low fall risk Low fall risk   Patient at Risk for Falls Due to No Fall Risks History of fall(s);Impaired mobility No Fall Risks No Fall Risks History of fall(s);Impaired balance/gait;Impaired mobility  Fall risk Follow up Falls evaluation completed Falls evaluation completed;Education provided;Falls prevention discussed Falls evaluation completed Falls evaluation completed Falls evaluation completed;Education provided   Functional Status Survey:    Vitals:   09/05/22 0857  BP: (!) 142/89  Pulse: 85  Resp: 20  Temp: 97.7 F (36.5 C)  SpO2: 93%  Weight: 171 lb (77.6 kg)  Height: 5\' 9"  (1.753 m)   Body mass index is 25.25 kg/m. Physical Exam Vitals reviewed.  Constitutional:      General: He is not in acute distress. HENT:     Head: Normocephalic.     Right Ear: There is no impacted cerumen.     Left Ear: There is no impacted cerumen.     Ears:     Comments: Right cochlear implant    Nose: Nose normal.     Mouth/Throat:     Mouth: Mucous membranes are moist.  Eyes:     General:        Right eye: No discharge.        Left eye: No discharge.  Cardiovascular:     Rate and Rhythm: Normal rate and regular rhythm.     Pulses:          Dorsalis pedis  pulses are 1+ on the right side and 1+ on the left side.       Posterior tibial  pulses are 1+ on the right side and 1+ on the left side.     Heart sounds: Normal heart sounds.  Pulmonary:     Effort: Pulmonary effort is normal. No respiratory distress.     Breath sounds: Normal breath sounds. No wheezing.  Abdominal:     General: Bowel sounds are normal. There is no distension.     Palpations: Abdomen is soft.     Tenderness: There is no abdominal tenderness.  Musculoskeletal:     Cervical back: Neck supple.     Right lower leg: No edema.     Left lower leg: No edema.  Feet:     Right foot:     Protective Sensation: 10 sites tested.  10 sites sensed.     Skin integrity: Skin integrity normal.     Toenail Condition: Fungal disease present.    Left foot:     Protective Sensation: 10 sites tested.  10 sites sensed.     Skin integrity: Skin integrity normal.     Toenail Condition: Fungal disease present. Skin:    General: Skin is warm and dry.     Capillary Refill: Capillary refill takes less than 2 seconds.  Neurological:     General: No focal deficit present.     Mental Status: He is alert and oriented to person, place, and time.     Motor: Weakness present.     Gait: Gait abnormal.     Comments: wheelchair  Psychiatric:        Mood and Affect: Mood normal.        Behavior: Behavior normal.     Labs reviewed: Recent Labs    09/22/21 0000  NA 139  K 3.9  CL 106  CO2 26*  BUN 33*  CREATININE 1.4*  CALCIUM 10.4   No results for input(s): "AST", "ALT", "ALKPHOS", "BILITOT", "PROT", "ALBUMIN" in the last 8760 hours. No results for input(s): "WBC", "NEUTROABS", "HGB", "HCT", "MCV", "PLT" in the last 8760 hours. Lab Results  Component Value Date   TSH 1.79 10/26/2021   Lab Results  Component Value Date   HGBA1C 6.4 12/26/2021   Lab Results  Component Value Date   CHOL 188 09/22/2021   HDL 41 09/22/2021   LDLCALC 119 09/22/2021   LDLDIRECT 96.0 08/23/2020    TRIG 163 (A) 09/22/2021   CHOLHDL 4.4 10/21/2020    Significant Diagnostic Results in last 30 days:  No results found.  Assessment/Plan 1. Type 2 diabetes mellitus with stage 3a chronic kidney disease, without long-term current use of insulin (HCC) - A1c 6.4 12/2021, no hypoglycemias - foot exam done today> normal - metformin started 04/2022 - A1c  2. Sensory hearing loss - followed by Dr. Owens Shark f/u in 3 years - right cochlear implant  3. TIA (transient ischemic attack) - cont Plavix  4. Hx of CABG - cont Plavix  5. Essential hypertension, benign - controlled - cont   6. Mixed hyperlipidemia - LDL 119 09/2021 - statin intolerance - lipid panel  7. Stage 3a chronic kidney disease (HCC) - avoid nephrotoxic drugs like NSAIDS and dose adjust medications to be renally excreted - encourage hydration with water   8. Memory deficit - no behaviors - not on medication  9. History of gout - cont allopurinol  10. Benign prostatic hyperplasia with urinary obstruction - cont finasteride    Family/ staff Communication: plan discussed with patient and nurse  Labs/tests ordered:  cbc/diff, cmp, lipid panel, A1c 09/10/2022

## 2022-09-11 LAB — BASIC METABOLIC PANEL
BUN: 33 — AB (ref 4–21)
CO2: 26 — AB (ref 13–22)
Chloride: 107 (ref 99–108)
Creatinine: 1.4 — AB (ref 0.6–1.3)
Glucose: 167
Potassium: 4.4 mEq/L (ref 3.5–5.1)
Sodium: 142 (ref 137–147)

## 2022-09-11 LAB — CBC AND DIFFERENTIAL
HCT: 39 — AB (ref 41–53)
Hemoglobin: 13.6 (ref 13.5–17.5)
Neutrophils Absolute: 2470
Platelets: 261 10*3/uL (ref 150–400)
WBC: 4.6

## 2022-09-11 LAB — LIPID PANEL
Cholesterol: 212 — AB (ref 0–200)
HDL: 41 (ref 35–70)
LDL Cholesterol: 140
Triglycerides: 174 — AB (ref 40–160)

## 2022-09-11 LAB — CBC: RBC: 4.44 (ref 3.87–5.11)

## 2022-09-11 LAB — HEPATIC FUNCTION PANEL
ALT: 15 U/L (ref 10–40)
AST: 17 (ref 14–40)
Alkaline Phosphatase: 35 (ref 25–125)

## 2022-09-11 LAB — COMPREHENSIVE METABOLIC PANEL
Albumin: 4.3 (ref 3.5–5.0)
Calcium: 10.7 (ref 8.7–10.7)
Globulin: 2.5

## 2022-09-11 LAB — HEMOGLOBIN A1C: Hemoglobin A1C: 7.6

## 2022-09-24 ENCOUNTER — Encounter: Payer: Self-pay | Admitting: Orthopedic Surgery

## 2022-09-24 ENCOUNTER — Non-Acute Institutional Stay (SKILLED_NURSING_FACILITY): Payer: Federal, State, Local not specified - PPO | Admitting: Orthopedic Surgery

## 2022-09-24 DIAGNOSIS — R58 Hemorrhage, not elsewhere classified: Secondary | ICD-10-CM

## 2022-09-24 DIAGNOSIS — W19XXXA Unspecified fall, initial encounter: Secondary | ICD-10-CM

## 2022-09-24 DIAGNOSIS — R410 Disorientation, unspecified: Secondary | ICD-10-CM

## 2022-09-24 DIAGNOSIS — H905 Unspecified sensorineural hearing loss: Secondary | ICD-10-CM

## 2022-09-24 LAB — CBC AND DIFFERENTIAL
HCT: 40 — AB (ref 41–53)
Hemoglobin: 13.9 (ref 13.5–17.5)
Neutrophils Absolute: 3907
Platelets: 282 10*3/uL (ref 150–400)
WBC: 6.5

## 2022-09-24 LAB — CBC: RBC: 4.52 (ref 3.87–5.11)

## 2022-09-24 NOTE — Progress Notes (Unsigned)
Location:   [facility] Room Number: 11-A Place of Service:  SNF (31) Provider:  Hazle NordmannAmy Caylie Sandquist, NP    Patient Care Team: Octavia HeirFargo, Byrdie Miyazaki E, NP as PCP - General (Adult Health Nurse Practitioner) Wendall StadeNishan, Peter C, MD as PCP - Cardiology (Cardiology)  Extended Emergency Contact Information Primary Emergency Contact: [name],[name]          [place] Home Phone: [phone] Mobile Phone: [phone] Relation: Daughter Secondary Emergency Contact: [name],[name] Address: [address]          [place], [place] [address] [place] Home Phone: [phone] Mobile Phone: [phone] Relation: Daughter  Code Status:  DNR Goals of care: Advanced Directive information    09/24/2022    9:59 AM  Advanced Directives  Does Patient Have a Medical Advance Directive? Yes  Type of Estate agentAdvance Directive Healthcare Power of [place];Living will;Out of facility DNR (pink MOST or yellow form)  Does patient want to make changes to medical advance directive? No - Patient declined  Copy of Healthcare Power of Attorney in Chart? Yes - validated most recent copy scanned in chart (See row information)     Chief Complaint  Patient presents with   Acute Visit    Confusion.    HPI:  Juan [name] is a 87 y.o. male seen today for an acute visit due to increased confusion.   [facility] PMH: CAD, s/p CABG 2000, HTN, TIA 2022, T2DM, HOH- cochlear implant, OA, BPH, CKD, memory deficit, shuffling gait, and weight loss.     04/06 he was found on floor by nursing staff. He admitted to hitting his head. Neuro checks were stable. Cochlear implant was damaged. 04/07 his daughter came to check on him and he did not recognize her. He also complained of seeing blood while using bathroom.   Today, he is alert and oriented x 4. He reports seeing blood while using bathroom on 04/06. He is unsure if blood came from urine  or stool. He denies dysuria, constipation, hard stools or straining. Cochlear implant is working without difficulty. Afebrile. Vital stable.   Past Medical History:  Diagnosis Date   Cancer    skin   CKD (chronic kidney disease) 06/24/2013   Cochlear implant in place    Coronary artery disease    a. s/p CABG 2000; b. myoview 9/08: inf-septal scar with mild peri-infarct ischemia (low risk);  c.  Echo 8/13: mild focal basal septal hypertrophy, Gr 1 DD, mild LAE;  d. Lexiscan Myoview (06/20/2013): No reversible ischemia, inferior and septal infarct, inferior and septal hypokinesis, EF 60%;  e.  Echo (06/20/2011): EF 50-55%, normal wall motion, grade 1 diastolic dysfunction, mild LAE   Diverticular disease    Duodenitis    Elevated PSA    GERD (gastroesophageal reflux disease)    Gout    Humerus fracture    right   Hydronephrosis with ureteropelvic junction obstruction    Hypercalcemia 02/02/2012   a. 01/2012: 10.7.   Hyperlipidemia    Hypertension    Pancreatitis    a. ? Simvastatin related   Pneumonia age 89 or 7   "I think only once" (06/19/2013)   Syncope    a. 06/2013   TIA (transient ischemic attack)    a. 01/2012 - presentation as acute imbalance;  b. 01/2012 carotid U/S w/o signif stenosis;  c. 01/2012 Echo: EF 60-65%, Gr 1 DD.;  d.  Carotid US (  06/20/2013): 1-39% bilateral ICA stenosis   Tubular adenoma polyp of rectum    Type II diabetes mellitus    "diet and exercise controlled at present" (06/19/2013)   Past Surgical History:  Procedure Laterality Date   APPENDECTOMY     CARDIAC CATHETERIZATION  2000   CHOLECYSTECTOMY     COCHLEAR IMPLANT Right 1990's   COLONOSCOPY W/ POLYPECTOMY     no polyp 2012   CORONARY ARTERY BYPASS GRAFT  2000   CABG X5   INGUINAL HERNIA REPAIR Right 09/15/2015   Procedure: OPEN REPAIR RIGHT INGUINAL HERNIA ;  Surgeon: Avel Peace, MD;  Location: [facility];  Service: General;  Laterality: Right;   INSERTION OF MESH Right 09/15/2015   Procedure: INSERTION  OF MESH;  Surgeon: Avel Peace, MD;  Location: [facility];  Service: General;  Laterality: Right;   PARATHYROIDECTOMY  1978   POLYPECTOMY     REVERSE SHOULDER ARTHROPLASTY Right 12/30/2014   Procedure: Right shoulder ORIF, Biomet S3 Plate and ZImmer cable system ;  Surgeon: Beverely Low, MD;  Location: [facility] OR;  Service: Orthopedics;  Laterality: Right;   right ankle surgery  age 50   TONSILLECTOMY  20   trachestomy as child for throat closing      Allergies  Allergen Reactions   Simvastatin Other (See Comments)    Possible statin-induced pancreatitis    Allergies as of 09/24/2022       Reactions   Simvastatin Other (See Comments)   Possible statin-induced pancreatitis        Medication List        Accurate as of September 24, 2022  9:59 AM. If you have any questions, ask your nurse or doctor.          acetaminophen 500 MG tablet Commonly known as: TYLENOL Take 1,000 mg by mouth 2 (two) times daily.   allopurinol 100 MG tablet Commonly known as: ZYLOPRIM Take 100 mg by mouth daily.   amLODipine 2.5 MG tablet Commonly known as: NORVASC Take 1 tablet (2.5 mg total) by mouth daily.   clopidogrel 75 MG tablet Commonly known as: PLAVIX Take 75 mg by mouth daily.   colchicine 0.6 MG tablet Take 0.6 mg by mouth daily as needed (as directed for gout flares).   Fenofibric Acid 135 MG Cpdr Take 1 tablet by mouth in the morning.   finasteride 5 MG tablet Commonly known as: PROSCAR Take 5 mg by mouth every evening.   ipratropium-albuterol 0.5-2.5 (3) MG/3ML Soln Commonly known as: DUONEB Take 3 mLs by nebulization every 6 (six) hours as needed.   METFORMIN HCL PO Take 250 mg by mouth every morning. What changed: Another medication with the same name was removed. Continue taking this medication, and follow the directions you see here. Changed by: [name], NP   zinc oxide 20 % ointment Apply 1 application  topically as needed for irritation. Apply transdermally as  needed for skin irritation after each incontinent episode.        Review of Systems  Constitutional:  Negative for activity change and appetite change.  HENT:  Positive for hearing loss. Negative for trouble swallowing.   Eyes:  Negative for visual disturbance.  Respiratory:  Negative for cough, shortness of breath and wheezing.   Cardiovascular:  Negative for chest pain and leg swelling.  Gastrointestinal:  Positive for blood in stool. Negative for abdominal distention, abdominal pain, nausea, rectal pain and vomiting.  Genitourinary:  Positive for hematuria. Negative for dysuria and frequency.  Musculoskeletal:  Positive for gait problem.  Skin:  Negative for wound.  Neurological:  Positive for weakness. Negative for dizziness and light-headedness.  Psychiatric/Behavioral:  Positive for confusion. Negative for dysphoric mood and sleep disturbance. The patient is not nervous/anxious.     Immunization History  Administered Date(s) Administered   Fluad Quad(high Dose 65+) 04/01/2019, 04/11/2022   Influenza Split 04/13/2011, 05/02/2012   Influenza Whole 05/22/2007, 04/12/2008, 05/02/2009, 04/14/2010   Influenza, High Dose Seasonal PF 04/17/2013, 03/16/2014, 03/05/2016, 03/18/2017   Influenza,inj,Quad PF,6+ Mos 03/02/2015, 03/18/2018   Influenza-Unspecified 03/18/2018, 04/12/2021   Moderna Covid-19 Vaccine Bivalent Booster 77yrs & up 11/29/2021   Moderna SARS-COV2 Booster Vaccination 03/08/2021   Moderna Sars-Covid-2 Vaccination 06/22/2019, 07/20/2019, 05/02/2020, 11/23/2020   PPD Test 01/02/2015   Pneumococcal Conjugate-13 12/29/2015   Pneumococcal Polysaccharide-23 04/19/2006   Respiratory Syncytial Virus Vaccine,Recomb Aduvanted(Arexvy) 05/14/2022   Tdap 09/04/2016   Unspecified SARS-COV-2 Vaccination 04/24/2022   Zoster Recombinat (Shingrix) 09/17/2019, 03/29/2020   Zoster, Live 06/29/2015   Pertinent  Health Maintenance Due  Topic Date Due   OPHTHALMOLOGY EXAM  12/01/2022    INFLUENZA VACCINE  01/17/2023   HEMOGLOBIN A1C  03/14/2023   FOOT EXAM  09/05/2023      01/10/2022   10:24 AM 01/10/2022   12:47 PM 04/30/2022    2:09 PM 05/23/2022    9:44 AM 08/10/2022    3:12 PM  Fall Risk  Falls in the past year? 0 1 0 0 0  Was there an injury with Fall? 0 0 0 0 0  Fall Risk Category Calculator 0 1 0 0 0  Fall Risk Category (Retired) Low Low Low Low   (RETIRED) Patient Fall Risk Level Low fall risk High fall risk Low fall risk Low fall risk   Patient at Risk for Falls Due to No Fall Risks History of fall(s);Impaired mobility No Fall Risks No Fall Risks History of fall(s);Impaired balance/gait;Impaired mobility  Fall risk Follow up Falls evaluation completed Falls evaluation completed;Education provided;Falls prevention discussed Falls evaluation completed Falls evaluation completed Falls evaluation completed;Education provided   Functional Status Survey:    Vitals:   09/24/22 0950  BP: (!) 146/78  Pulse: 84  Resp: (!) 21  Temp: (!) 97.5 F (36.4 C)  SpO2: 93%  Weight: 170 lb 3.2 oz (77.2 kg)  Height: 5\' 9"  (1.753 m)   Body mass index is 25.13 kg/m. Physical Exam Vitals reviewed.  Constitutional:      General: He is not in acute distress. HENT:     Head: Normocephalic and atraumatic.     Ears:     Comments: Right cochlear implant    Nose: Nose normal.     Mouth/Throat:     Mouth: Mucous membranes are moist.  Eyes:     General:        Right eye: No discharge.        Left eye: No discharge.     Extraocular Movements: Extraocular movements intact.     Right eye: Normal extraocular motion and no nystagmus.     Left eye: Normal extraocular motion and no nystagmus.     Pupils: Pupils are equal, round, and reactive to light.  Cardiovascular:     Rate and Rhythm: Normal rate and regular rhythm.     Pulses: Normal pulses.     Heart sounds: Normal heart sounds.  Pulmonary:     Effort: Pulmonary effort is normal. No respiratory distress.     Breath  sounds: Normal breath sounds. No wheezing.  Abdominal:     General: Bowel sounds are normal. There is no distension.     Palpations: Abdomen is soft.     Tenderness: There is no abdominal tenderness.  Genitourinary:    Comments: Refused rectal exam Musculoskeletal:     Cervical back: Neck supple.     Right lower leg: No edema.     Left lower leg: No edema.  Skin:    General: Skin is warm and dry.     Capillary Refill: Capillary refill takes less than 2 seconds.  Neurological:     General: No focal deficit present.     Mental Status: He is alert and oriented to person, place, and time.     Motor: Weakness present.     Gait: Gait abnormal.  Psychiatric:        Mood and Affect: Mood normal.        Behavior: Behavior normal.     Labs reviewed: Recent Labs    09/11/22 0000  NA 142  K 4.4  CL 107  CO2 26*  BUN 33*  CREATININE 1.4*  CALCIUM 10.7   Recent Labs    09/11/22 0000  AST 17  ALT 15  ALKPHOS 35  ALBUMIN 4.3   Recent Labs    09/11/22 0000  WBC 4.6  NEUTROABS 2,470.00  HGB 13.6  HCT 39*  PLT 261   Lab Results  Component Value Date   TSH 1.79 10/26/2021   Lab Results  Component Value Date   HGBA1C 7.6 09/11/2022   Lab Results  Component Value Date   CHOL 212 (A) 09/11/2022   HDL 41 09/11/2022   LDLCALC 140 09/11/2022   LDLDIRECT 96.0 08/23/2020   TRIG 174 (A) 09/11/2022   CHOLHDL 4.4 10/21/2020    Significant Diagnostic Results in last 30 days:  No results found.  Assessment/Plan 1. Blood loss - hgb 13.6 09/11/2022 - on Plavix due to h/o TIA - patient reports blood loss 09/22/2022 while using bathroom -UTA rectum> patient refused - ? Hemorrhoids versus hematuria/dark stools - cbc/diff - UA/culture  2. Confusion - h/o memory deficit, BIMS score 15/15 03/2022 - noted 04/07 after mechanical fall 04/06> did not recognize daughter - neuro checks normal - exam unremarkable today - appropriate during encounter - ? associated with  hearing loss  3. Fall, initial encounter - noted 09/22/2022 - cont neuro checks  4. Sensory hearing loss - see above - ? Injury to right cochlear implant - working fine today   Family/ staff Communication: plan discussed with patient and nurse  Labs/tests ordered:  none

## 2022-10-04 ENCOUNTER — Non-Acute Institutional Stay (SKILLED_NURSING_FACILITY): Payer: Federal, State, Local not specified - PPO | Admitting: Internal Medicine

## 2022-10-04 ENCOUNTER — Encounter: Payer: Self-pay | Admitting: Internal Medicine

## 2022-10-04 DIAGNOSIS — G459 Transient cerebral ischemic attack, unspecified: Secondary | ICD-10-CM

## 2022-10-04 DIAGNOSIS — N401 Enlarged prostate with lower urinary tract symptoms: Secondary | ICD-10-CM | POA: Diagnosis not present

## 2022-10-04 DIAGNOSIS — E1122 Type 2 diabetes mellitus with diabetic chronic kidney disease: Secondary | ICD-10-CM | POA: Diagnosis not present

## 2022-10-04 DIAGNOSIS — E782 Mixed hyperlipidemia: Secondary | ICD-10-CM | POA: Diagnosis not present

## 2022-10-04 DIAGNOSIS — N1831 Chronic kidney disease, stage 3a: Secondary | ICD-10-CM

## 2022-10-04 DIAGNOSIS — N138 Other obstructive and reflux uropathy: Secondary | ICD-10-CM

## 2022-10-04 DIAGNOSIS — Z8739 Personal history of other diseases of the musculoskeletal system and connective tissue: Secondary | ICD-10-CM

## 2022-10-04 NOTE — Progress Notes (Signed)
Location:  [facility] Room Number: 11A Place of Service:  SNF (31) Provider:  Vance Gather, NP  Patient Care Team: [name], NP as PCP - General (Adult Health Nurse Practitioner) [name], MD as PCP - Cardiology (Cardiology)  Extended Emergency Contact Information Primary Emergency Contact: [name],[name]          [place], [place] Home Phone: [phone] Mobile Phone: [phone] Relation: Daughter Secondary Emergency Contact: [name],[name] Address: [address]          [place], [place] [address] [name] Home Phone: [phone] Mobile Phone: [phone] Relation: Daughter  Code Status:  DNR Goals of care: Advanced Directive information    10/04/2022   11:51 AM  Advanced Directives  Does Patient Have a Medical Advance Directive? Yes  Type of Estate agent of [place];Living will;Out of facility DNR (pink MOST or yellow form)  Does patient want to make changes to medical advance directive? No - Patient declined  Copy of Healthcare Power of Attorney in Chart? Yes - validated most recent copy scanned in chart (See row information)     Chief Complaint  Patient presents with   Medical Management of Chronic Issues    Patient is being seen for a Routine visit.    HPI:  Pt is a 87 y.o. male seen today for medical management of chronic diseases.    Lives in SNF   Patient has a history of CAD, S/P CABG in 2000, gout, HLD, hypertension and diabetes Unstable Gait No further work up per Neurology Has Auditory Implants  He fell few weeks ago but has not sustained any injuries Neuro check was negative UA negative CBC normal He was planning to go to AL trial but that is now on hold due to Urinary Incontinence He had no other issues Stays in Wheelchair but can do transfers Has gained weight CBGS are also running more then 180-200 Wt Readings from Last 3 Encounters:   10/04/22 173 lb (78.5 kg)  09/24/22 170 lb 3.2 oz (77.2 kg)  09/05/22 171 lb (77.6 kg)     Past Medical History:  Diagnosis Date   Cancer    skin   CKD (chronic kidney disease) 06/24/2013   Cochlear implant in place    Coronary artery disease    a. s/p CABG 2000; b. myoview 9/08: inf-septal scar with mild peri-infarct ischemia (low risk);  c.  Echo 8/13: mild focal basal septal hypertrophy, Gr 1 DD, mild LAE;  d. Lexiscan Myoview (06/20/2013): No reversible ischemia, inferior and septal infarct, inferior and septal hypokinesis, EF 60%;  e.  Echo (06/20/2011): EF 50-55%, normal wall motion, grade 1 diastolic dysfunction, mild LAE   Diverticular disease    Duodenitis    Elevated PSA    GERD (gastroesophageal reflux disease)    Gout    Humerus fracture    right   Hydronephrosis with ureteropelvic junction obstruction    Hypercalcemia 02/02/2012   a. 01/2012: 10.7.   Hyperlipidemia    Hypertension    Pancreatitis    a. ? Simvastatin related   Pneumonia age 82 or 7   "I think only once" (06/19/2013)   Syncope    a. 06/2013   TIA (transient ischemic attack)    a. 01/2012 - presentation as acute imbalance;  b. 01/2012 carotid U/S w/o signif stenosis;  c. 01/2012 Echo: EF 60-65%, Gr 1 DD.;  d.  Carotid US (06/20/2013): 1-39%  bilateral ICA stenosis   Tubular adenoma polyp of rectum    Type II diabetes mellitus    "diet and exercise controlled at present" (06/19/2013)   Past Surgical History:  Procedure Laterality Date   APPENDECTOMY     CARDIAC CATHETERIZATION  2000   CHOLECYSTECTOMY     COCHLEAR IMPLANT Right 1990's   COLONOSCOPY W/ POLYPECTOMY     no polyp 2012   CORONARY ARTERY BYPASS GRAFT  2000   CABG X5   INGUINAL HERNIA REPAIR Right 09/15/2015   Procedure: OPEN REPAIR RIGHT INGUINAL HERNIA ;  Surgeon: Avel Peace, MD;  Location: [facility];  Service: General;  Laterality: Right;   INSERTION OF MESH Right 09/15/2015   Procedure: INSERTION OF MESH;  Surgeon: Avel Peace, MD;   Location: [facility];  Service: General;  Laterality: Right;   PARATHYROIDECTOMY  1978   POLYPECTOMY     REVERSE SHOULDER ARTHROPLASTY Right 12/30/2014   Procedure: Right shoulder ORIF, Biomet S3 Plate and ZImmer cable system ;  Surgeon: Beverely Low, MD;  Location: [facility];  Service: Orthopedics;  Laterality: Right;   right ankle surgery  age 34   TONSILLECTOMY  45   trachestomy as child for throat closing      Allergies  Allergen Reactions   Simvastatin Other (See Comments)    Possible statin-induced pancreatitis    Outpatient Encounter Medications as of 10/04/2022  Medication Sig   acetaminophen (TYLENOL) 500 MG tablet Take 1,000 mg by mouth 2 (two) times daily.   allopurinol (ZYLOPRIM) 100 MG tablet Take 100 mg by mouth daily.   amLODipine (NORVASC) 2.5 MG tablet Take 1 tablet (2.5 mg total) by mouth daily.   Choline Fenofibrate (FENOFIBRIC ACID) 135 MG CPDR Take 1 tablet by mouth in the morning.   clopidogrel (PLAVIX) 75 MG tablet Take 75 mg by mouth daily.   colchicine 0.6 MG tablet Take 0.6 mg by mouth daily as needed (as directed for gout flares).   finasteride (PROSCAR) 5 MG tablet Take 5 mg by mouth every evening.   ipratropium-albuterol (DUONEB) 0.5-2.5 (3) MG/3ML SOLN Take 3 mLs by nebulization every 6 (six) hours as needed.   METFORMIN HCL PO Take 250 mg by mouth every morning.   zinc oxide 20 % ointment Apply 1 application  topically as needed for irritation. Apply transdermally as needed for skin irritation after each incontinent episode.   No facility-administered encounter medications on file as of 10/04/2022.    Review of Systems  Constitutional:  Negative for activity change, appetite change and unexpected weight change.  HENT: Negative.    Respiratory:  Negative for cough and shortness of breath.   Cardiovascular:  Negative for leg swelling.  Gastrointestinal:  Negative for constipation.  Genitourinary:  Negative for frequency.  Musculoskeletal:  Negative for  arthralgias, gait problem and myalgias.  Skin: Negative.  Negative for rash.  Neurological:  Negative for dizziness and weakness.  Psychiatric/Behavioral:  Negative for confusion and sleep disturbance.   All other systems reviewed and are negative.   Immunization History  Administered Date(s) Administered   Fluad Quad(high Dose 65+) 04/01/2019, 04/11/2022   Influenza Split 04/13/2011, 05/02/2012   Influenza Whole 05/22/2007, 04/12/2008, 05/02/2009, 04/14/2010   Influenza, High Dose Seasonal PF 04/17/2013, 03/16/2014, 03/05/2016, 03/18/2017   Influenza,inj,Quad PF,6+ Mos 03/02/2015, 03/18/2018   Influenza-Unspecified 03/18/2018, 04/12/2021   Moderna Covid-19 Vaccine Bivalent Booster 30yrs & up 11/29/2021   Moderna SARS-COV2 Booster Vaccination 03/08/2021   Moderna Sars-Covid-2 Vaccination 06/22/2019, 07/20/2019, 05/02/2020, 11/23/2020   PPD  Test 01/02/2015   Pneumococcal Conjugate-13 12/29/2015   Pneumococcal Polysaccharide-23 04/19/2006   Respiratory Syncytial Virus Vaccine,Recomb Aduvanted(Arexvy) 05/14/2022   Tdap 09/04/2016   Unspecified SARS-COV-2 Vaccination 04/24/2022   Zoster Recombinat (Shingrix) 09/17/2019, 03/29/2020   Zoster, Live 06/29/2015   Pertinent  Health Maintenance Due  Topic Date Due   OPHTHALMOLOGY EXAM  12/01/2022   INFLUENZA VACCINE  01/17/2023   HEMOGLOBIN A1C  03/14/2023   FOOT EXAM  09/05/2023      01/10/2022   10:24 AM 01/10/2022   12:47 PM 04/30/2022    2:09 PM 05/23/2022    9:44 AM 08/10/2022    3:12 PM  Fall Risk  Falls in the past year? 0 1 0 0 0  Was there an injury with Fall? 0 0 0 0 0  Fall Risk Category Calculator 0 1 0 0 0  Fall Risk Category (Retired) Low Low Low Low   (RETIRED) Patient Fall Risk Level Low fall risk High fall risk Low fall risk Low fall risk   Patient at Risk for Falls Due to No Fall Risks History of fall(s);Impaired mobility No Fall Risks No Fall Risks History of fall(s);Impaired balance/gait;Impaired mobility  Fall  risk Follow up Falls evaluation completed Falls evaluation completed;Education provided;Falls prevention discussed Falls evaluation completed Falls evaluation completed Falls evaluation completed;Education provided   Functional Status Survey:    Vitals:   10/04/22 1147  BP: (!) 143/89  Pulse: 86  Resp: 20  Temp: (!) 96.8 F (36 C)  TempSrc: Temporal  SpO2: 92%  Weight: 173 lb (78.5 kg)  Height: 5\' 9"  (1.753 m)   Body mass index is 25.55 kg/m. Physical Exam Vitals reviewed.  Constitutional:      Appearance: Normal appearance.  HENT:     Head: Normocephalic.     Nose: Nose normal.     Mouth/Throat:     Mouth: Mucous membranes are moist.     Pharynx: Oropharynx is clear.  Eyes:     Pupils: Pupils are equal, round, and reactive to light.  Cardiovascular:     Rate and Rhythm: Normal rate and regular rhythm.     Pulses: Normal pulses.     Heart sounds: No murmur heard. Pulmonary:     Effort: Pulmonary effort is normal. No respiratory distress.     Breath sounds: Normal breath sounds. No rales.  Abdominal:     General: Abdomen is flat. Bowel sounds are normal.     Palpations: Abdomen is soft.  Musculoskeletal:        General: No swelling.     Cervical back: Neck supple.  Skin:    General: Skin is warm.  Neurological:     Mental Status: He is alert and oriented to person, place, and time.  Psychiatric:        Mood and Affect: Mood normal.        Thought Content: Thought content normal.     Labs reviewed: Recent Labs    09/11/22 0000  NA 142  K 4.4  CL 107  CO2 26*  BUN 33*  CREATININE 1.4*  CALCIUM 10.7   Recent Labs    09/11/22 0000  AST 17  ALT 15  ALKPHOS 35  ALBUMIN 4.3   Recent Labs    09/11/22 0000  WBC 4.6  NEUTROABS 2,470.00  HGB 13.6  HCT 39*  PLT 261   Lab Results  Component Value Date   TSH 1.79 10/26/2021   Lab Results  Component Value Date   HGBA1C  7.6 09/11/2022   Lab Results  Component Value Date   CHOL 212 (A)  09/11/2022   HDL 41 09/11/2022   LDLCALC 140 09/11/2022   LDLDIRECT 96.0 08/23/2020   TRIG 174 (A) 09/11/2022   CHOLHDL 4.4 10/21/2020    Significant Diagnostic Results in last 30 days:  No results found.  Assessment/Plan 1. Type 2 diabetes mellitus with stage 3a chronic kidney disease, without long-term current use of insulin Change Metformin to 250 mg BID as his A1C is elevated  2. Mixed hyperlipidemia Allergic to statin Will start on Zetia  3. Stage 3a chronic kidney disease Creat stable   4. Benign prostatic hyperplasia with urinary obstruction Proscar  5. History of gout Allopurinol  6. TIA (transient ischemic attack) Plavix   Family/ staff Communication:   Labs/tests ordered:

## 2022-10-23 ENCOUNTER — Encounter: Payer: Self-pay | Admitting: Adult Health

## 2022-10-23 ENCOUNTER — Non-Acute Institutional Stay (SKILLED_NURSING_FACILITY): Payer: Federal, State, Local not specified - PPO | Admitting: Adult Health

## 2022-10-23 DIAGNOSIS — N1831 Type 2 diabetes mellitus with diabetic chronic kidney disease: Secondary | ICD-10-CM

## 2022-10-23 DIAGNOSIS — E1122 Type 2 diabetes mellitus with diabetic chronic kidney disease: Secondary | ICD-10-CM | POA: Diagnosis not present

## 2022-10-23 DIAGNOSIS — I10 Essential (primary) hypertension: Secondary | ICD-10-CM | POA: Diagnosis not present

## 2022-10-23 DIAGNOSIS — L219 Seborrheic dermatitis, unspecified: Secondary | ICD-10-CM | POA: Diagnosis not present

## 2022-10-23 MED ORDER — KETOCONAZOLE 2 % EX CREA
1.0000 | TOPICAL_CREAM | Freq: Two times a day (BID) | CUTANEOUS | 0 refills | Status: AC
Start: 2022-10-23 — End: 2022-11-06

## 2022-10-23 MED ORDER — AMLODIPINE BESYLATE 5 MG PO TABS
5.0000 mg | ORAL_TABLET | Freq: Every day | ORAL | 3 refills | Status: DC
Start: 2022-10-23 — End: 2023-05-07

## 2022-10-23 NOTE — Progress Notes (Signed)
Location:  [facility] Room Number: 11-A Place of Service:  SNF (31) Provider: Margit Banda. Grayce Sessions, NP   Patient Care Team: [name], NP as PCP - General (Adult Health Nurse Practitioner) [name], MD as PCP - Cardiology (Cardiology)  Extended Emergency Contact Information Primary Emergency Contact: [name],[name]          [place], [place] Home Phone: [phone] Mobile Phone: [phone] Relation: Daughter Secondary Emergency Contact: [name],[name] Address: [address]          [place], [place] [address] [name] Home Phone: [phone] Mobile Phone: [phone] Relation: Daughter  Code Status:  DNR Goals of care: Advanced Directive information    10/23/2022   12:28 PM  Advanced Directives  Does Patient Have a Medical Advance Directive? Yes  Type of Estate agent of [place];Living will;Out of facility DNR (pink MOST or yellow form)  Copy of Healthcare Power of Attorney in Chart? Yes - validated most recent copy scanned in chart (See row information)     Chief Complaint  Patient presents with   Acute Visit    Ear lobe hurts    HPI:  Pt is a 87 y.o. male seen today for an acute visit for pain on his right ear lobe. He is a long-term resident of [facility]. He has hearing loss and has a cochlear implant. He uses a hearing aide device anchored to his right ear lobe when he is out of bed. He complained of right ear lobe hurting. He was noted to have erythematous yellowish scales on his right earlobe. BP 176/79,  SBPs ranges from 125 to 176. He takes Amlodipine 2.5 mg daily for hypertension. He denies headache.   Past Medical History:  Diagnosis Date   Cancer (HCC)    skin   CKD (chronic kidney disease) 06/24/2013   Cochlear implant in place    Coronary artery disease    a. s/p CABG 2000; b. myoview 9/08: inf-septal scar with mild peri-infarct ischemia (low risk);   c.  Echo 8/13: mild focal basal septal hypertrophy, Gr 1 DD, mild LAE;  d. Lexiscan Myoview (06/20/2013): No reversible ischemia, inferior and septal infarct, inferior and septal hypokinesis, EF 60%;  e.  Echo (06/20/2011): EF 50-55%, normal wall motion, grade 1 diastolic dysfunction, mild LAE   Diverticular disease    Duodenitis    Elevated PSA    GERD (gastroesophageal reflux disease)    Gout    Humerus fracture    right   Hydronephrosis with ureteropelvic junction obstruction    Hypercalcemia 02/02/2012   a. 01/2012: 10.7.   Hyperlipidemia    Hypertension    Pancreatitis    a. ? Simvastatin related   Pneumonia age 37 or 7   "I think only once" (06/19/2013)   Syncope    a. 06/2013   TIA (transient ischemic attack)    a. 01/2012 - presentation as acute imbalance;  b. 01/2012 carotid U/S w/o signif stenosis;  c. 01/2012 Echo: EF 60-65%, Gr 1 DD.;  d.  Carotid US (06/20/2013): 1-39% bilateral ICA stenosis   Tubular adenoma polyp of rectum    Type II diabetes mellitus (HCC)    "diet and exercise controlled at present" (06/19/2013)   Past Surgical History:  Procedure Laterality Date   APPENDECTOMY     CARDIAC CATHETERIZATION  2000   CHOLECYSTECTOMY     COCHLEAR IMPLANT Right 1990's   COLONOSCOPY W/ POLYPECTOMY  no polyp 2012   CORONARY ARTERY BYPASS GRAFT  2000   CABG X5   INGUINAL HERNIA REPAIR Right 09/15/2015   Procedure: OPEN REPAIR RIGHT INGUINAL HERNIA ;  Surgeon: Avel Peace, MD;  Location: [facility];  Service: General;  Laterality: Right;   INSERTION OF MESH Right 09/15/2015   Procedure: INSERTION OF MESH;  Surgeon: Avel Peace, MD;  Location: [facility];  Service: General;  Laterality: Right;   PARATHYROIDECTOMY  1978   POLYPECTOMY     REVERSE SHOULDER ARTHROPLASTY Right 12/30/2014   Procedure: Right shoulder ORIF, Biomet S3 Plate and ZImmer cable system ;  Surgeon: Beverely Low, MD;  Location: [facility] OR;  Service: Orthopedics;  Laterality: Right;   right ankle surgery  age 59    TONSILLECTOMY  75   trachestomy as child for throat closing      Allergies  Allergen Reactions   Simvastatin Other (See Comments)    Possible statin-induced pancreatitis    Outpatient Encounter Medications as of 10/23/2022  Medication Sig   acetaminophen (TYLENOL) 500 MG tablet Take 1,000 mg by mouth 2 (two) times daily.   allopurinol (ZYLOPRIM) 100 MG tablet Take 100 mg by mouth daily.   amLODipine (NORVASC) 5 MG tablet Take 1 tablet (5 mg total) by mouth daily.   Choline Fenofibrate (FENOFIBRIC ACID) 135 MG CPDR Take 1 tablet by mouth in the morning.   clopidogrel (PLAVIX) 75 MG tablet Take 75 mg by mouth daily.   colchicine 0.6 MG tablet Take 0.6 mg by mouth daily as needed (as directed for gout flares).   ezetimibe (ZETIA) 10 MG tablet Take 10 mg by mouth daily.   finasteride (PROSCAR) 5 MG tablet Take 5 mg by mouth every evening.   ipratropium-albuterol (DUONEB) 0.5-2.5 (3) MG/3ML SOLN Take 3 mLs by nebulization every 6 (six) hours as needed.   ketoconazole (NIZORAL) 2 % cream Apply 1 Application topically 2 (two) times daily for 14 days.   METFORMIN HCL PO Take 250 mg by mouth 2 (two) times daily.   zinc oxide 20 % ointment Apply 1 application  topically as needed for irritation. Apply transdermally as needed for skin irritation after each incontinent episode.   [DISCONTINUED] amLODipine (NORVASC) 2.5 MG tablet Take 1 tablet (2.5 mg total) by mouth daily.   No facility-administered encounter medications on file as of 10/23/2022.    Review of Systems  Constitutional:  Negative for activity change, appetite change and fever.  HENT:  Negative for sore throat.   Eyes: Negative.   Cardiovascular:  Negative for chest pain and leg swelling.  Gastrointestinal:  Negative for abdominal distention, diarrhea and vomiting.  Genitourinary:  Negative for dysuria, frequency and urgency.  Skin:  Negative for color change.       Right earlobe tender  Neurological:  Negative for dizziness and  headaches.  Psychiatric/Behavioral:  Negative for behavioral problems and sleep disturbance. The patient is not nervous/anxious.     Immunization History  Administered Date(s) Administered   Fluad Quad(high Dose 65+) 04/01/2019, 04/11/2022   Influenza Split 04/13/2011, 05/02/2012   Influenza Whole 05/22/2007, 04/12/2008, 05/02/2009, 04/14/2010   Influenza, High Dose Seasonal PF 04/17/2013, 03/16/2014, 03/05/2016, 03/18/2017   Influenza,inj,Quad PF,6+ Mos 03/02/2015, 03/18/2018   Influenza-Unspecified 03/18/2018, 04/12/2021   Moderna Covid-19 Vaccine Bivalent Booster 61yrs & up 11/29/2021   Moderna SARS-COV2 Booster Vaccination 03/08/2021   Moderna Sars-Covid-2 Vaccination 06/22/2019, 07/20/2019, 05/02/2020, 11/23/2020   PPD Test 01/02/2015   Pneumococcal Conjugate-13 12/29/2015   Pneumococcal Polysaccharide-23 04/19/2006   Respiratory  Syncytial Virus Vaccine,Recomb Aduvanted(Arexvy) 05/14/2022   Tdap 09/04/2016   Unspecified SARS-COV-2 Vaccination 04/24/2022   Zoster Recombinat (Shingrix) 09/17/2019, 03/29/2020   Zoster, Live 06/29/2015   Pertinent  Health Maintenance Due  Topic Date Due   OPHTHALMOLOGY EXAM  12/01/2022   INFLUENZA VACCINE  01/17/2023   HEMOGLOBIN A1C  03/14/2023   FOOT EXAM  09/05/2023      01/10/2022   10:24 AM 01/10/2022   12:47 PM 04/30/2022    2:09 PM 05/23/2022    9:44 AM 08/10/2022    3:12 PM  Fall Risk  Falls in the past year? 0 1 0 0 0  Was there an injury with Fall? 0 0 0 0 0  Fall Risk Category Calculator 0 1 0 0 0  Fall Risk Category (Retired) Low Low Low Low   (RETIRED) Patient Fall Risk Level Low fall risk High fall risk Low fall risk Low fall risk   Patient at Risk for Falls Due to No Fall Risks History of fall(s);Impaired mobility No Fall Risks No Fall Risks History of fall(s);Impaired balance/gait;Impaired mobility  Fall risk Follow up Falls evaluation completed Falls evaluation completed;Education provided;Falls prevention discussed Falls  evaluation completed Falls evaluation completed Falls evaluation completed;Education provided   Functional Status Survey:    Vitals:   10/23/22 1231 10/23/22 1413  BP: (!) 176/79 124/74  Pulse: (!) 47   Resp: 20   Temp: (!) 97.1 F (36.2 C)   SpO2: 95%   Weight: 173 lb (78.5 kg)   Height: 5\' 9"  (1.753 m)    Body mass index is 25.55 kg/m. Physical Exam Constitutional:      Appearance: Normal appearance.  HENT:     Head: Normocephalic and atraumatic.     Mouth/Throat:     Mouth: Mucous membranes are moist.  Eyes:     Conjunctiva/sclera: Conjunctivae normal.  Cardiovascular:     Rate and Rhythm: Normal rate and regular rhythm.     Pulses: Normal pulses.     Heart sounds: Normal heart sounds.  Pulmonary:     Effort: Pulmonary effort is normal.     Breath sounds: Normal breath sounds.  Abdominal:     General: Bowel sounds are normal.     Palpations: Abdomen is soft.  Musculoskeletal:        General: No swelling. Normal range of motion.     Cervical back: Normal range of motion.  Skin:    General: Skin is warm and dry.     Findings: Erythema present.     Comments: erythematous yellowish scales on his right earlobe  Neurological:     General: No focal deficit present.     Mental Status: He is alert and oriented to person, place, and time.  Psychiatric:        Mood and Affect: Mood normal.        Behavior: Behavior normal.        Thought Content: Thought content normal.        Judgment: Judgment normal.     Labs reviewed: Recent Labs    09/11/22 0000  NA 142  K 4.4  CL 107  CO2 26*  BUN 33*  CREATININE 1.4*  CALCIUM 10.7   Recent Labs    09/11/22 0000  AST 17  ALT 15  ALKPHOS 35  ALBUMIN 4.3   Recent Labs    09/11/22 0000 09/24/22 0000  WBC 4.6 6.5  NEUTROABS 2,470.00 3,907.00  HGB 13.6 13.9  HCT 39* 40*  PLT  261 282   Lab Results  Component Value Date   TSH 1.79 10/26/2021   Lab Results  Component Value Date   HGBA1C 7.6 09/11/2022    Lab Results  Component Value Date   CHOL 212 (A) 09/11/2022   HDL 41 09/11/2022   LDLCALC 140 09/11/2022   LDLDIRECT 96.0 08/23/2020   TRIG 174 (A) 09/11/2022   CHOLHDL 4.4 10/21/2020    Significant Diagnostic Results in last 30 days:  No results found.  Assessment/Plan  1. Seborrheic dermatitis -  will start on Ketoconazole cream - ketoconazole (NIZORAL) 2 % cream; Apply 1 Application topically 2 (two) times daily for 14 days.  Dispense: 28 g; Refill: 0  2. Essential hypertension, benign -  will increase Amlodipine dosage from 2.5 mg daily to 5 mg daily -  monitor BP - amLODipine (NORVASC) 5 MG tablet; Take 1 tablet (5 mg total) by mouth daily.  Dispense: 30 tablet; Refill: 3  3. Type 2 diabetes mellitus with stage 3a chronic kidney disease, without long-term current use of insulin (HCC) Lab Results  Component Value Date   HGBA1C 7.6 09/11/2022   -  stable -  continue Metformin     Family/ staff Communication:  Discussed plan of care with refsident and charge nurse.  Labs/tests ordered:   None

## 2022-10-29 ENCOUNTER — Encounter: Payer: Self-pay | Admitting: Orthopedic Surgery

## 2022-10-29 ENCOUNTER — Non-Acute Institutional Stay (SKILLED_NURSING_FACILITY): Payer: Federal, State, Local not specified - PPO | Admitting: Orthopedic Surgery

## 2022-10-29 DIAGNOSIS — N3944 Nocturnal enuresis: Secondary | ICD-10-CM

## 2022-10-29 DIAGNOSIS — H905 Unspecified sensorineural hearing loss: Secondary | ICD-10-CM

## 2022-10-29 DIAGNOSIS — E1122 Type 2 diabetes mellitus with diabetic chronic kidney disease: Secondary | ICD-10-CM

## 2022-10-29 DIAGNOSIS — N401 Enlarged prostate with lower urinary tract symptoms: Secondary | ICD-10-CM

## 2022-10-29 DIAGNOSIS — N1831 Chronic kidney disease, stage 3a: Secondary | ICD-10-CM

## 2022-10-29 DIAGNOSIS — E782 Mixed hyperlipidemia: Secondary | ICD-10-CM

## 2022-10-29 DIAGNOSIS — I10 Essential (primary) hypertension: Secondary | ICD-10-CM

## 2022-10-29 DIAGNOSIS — G459 Transient cerebral ischemic attack, unspecified: Secondary | ICD-10-CM | POA: Diagnosis not present

## 2022-10-29 DIAGNOSIS — Z8739 Personal history of other diseases of the musculoskeletal system and connective tissue: Secondary | ICD-10-CM

## 2022-10-29 DIAGNOSIS — Z951 Presence of aortocoronary bypass graft: Secondary | ICD-10-CM

## 2022-10-29 DIAGNOSIS — N138 Other obstructive and reflux uropathy: Secondary | ICD-10-CM

## 2022-10-29 DIAGNOSIS — R413 Other amnesia: Secondary | ICD-10-CM

## 2022-10-29 MED ORDER — METFORMIN HCL 500 MG PO TABS
ORAL_TABLET | ORAL | 3 refills | Status: DC
Start: 2022-10-29 — End: 2023-01-25

## 2022-10-29 NOTE — Progress Notes (Signed)
Location:  [facility] Room Number: 11/A Place of Service:  SNF (31) Provider:  [name], NP   [name], NP  Patient Care Team: [name], NP as PCP - General (Adult Health Nurse Practitioner) [name], MD as PCP - Cardiology (Cardiology)  Extended Emergency Contact Information Primary Emergency Contact: [name],[name]          [place], [place] Home Phone: [phone] Mobile Phone: [phone] Relation: Daughter Secondary Emergency Contact: [name],[name] Address: [address]          [place], [place] [address] [name] Home Phone: [phone] Mobile Phone: [phone] Relation: Daughter  Code Status:  DNR Goals of care: Advanced Directive information    10/23/2022   12:28 PM  Advanced Directives  Does Patient Have a Medical Advance Directive? Yes  Type of Estate agent of [place];Living will;Out of facility DNR (pink MOST or yellow form)  Copy of Healthcare Power of Attorney in Chart? Yes - validated most recent copy scanned in chart (See row information)     Chief Complaint  Patient presents with   Medical Management of Chronic Issues    HPI:  Pt is a 87 y.o. male seen today for medical management of chronic diseases.    He currently resides on the skilled nursing unit at [facility]. PMH: CAD, s/p CABG 2000, HTN, TIA 2022, T2DM, HOH- cochlear implant, OA, BPH, CKD, memory deficit, shuffling gait, and weight loss.    Nocturnal enuresis- failed AL trial due to urinary incontinence at night, no issues during day per nursing  T2DM- A1c 7.6 (03/26)> was 7.2 (11/09),  blood sugars 150-190's, no hypoglycemias, urine microalbumin 31 04/27/2022, eye exam 11/30/2021, regular diet, remains on metformin HxTIA- no further workup per neurology, remains on plavix and fenofibrate H/o CABG- evaluated by cardiology, 10/2020 LVEF 60-65%, remains on plavix HTN- BUN/creat 33/1.4  09/11/2022, remains on amlodipine HLD- LDL 140 09/11/2022, allergic to statins CKD- see above Memory deficit- BIMS score 15/15 (05/03)> was 03/2022, CT head 12/2020 atrophy and chronic microvascular ischemic changes noted, no behaviors Hx of gout-no recent flares, remains on allopurinol and colchicine prn BPH- remains on finasteride Sensory hearing loss- followed by Dr. Manson Passey, right cochlear implant in place  Seen by podiatry 05/10  No recent falls or injuries  Recent weights:  05/02-  173.2 lbs  04/01- 170.6 lbs  03/04- 171.4 lbs  Recent blood pressures:  05/07- 143/82  04/30- 176/79  04/23- 132/80    Past Medical History:  Diagnosis Date   Cancer (HCC)    skin   CKD (chronic kidney disease) 06/24/2013   Cochlear implant in place    Coronary artery disease    a. s/p CABG 2000; b. myoview 9/08: inf-septal scar with mild peri-infarct ischemia (low risk);  c.  Echo 8/13: mild focal basal septal hypertrophy, Gr 1 DD, mild LAE;  d. Lexiscan Myoview (06/20/2013): No reversible ischemia, inferior and septal infarct, inferior and septal hypokinesis, EF 60%;  e.  Echo (06/20/2011): EF 50-55%, normal wall motion, grade 1 diastolic dysfunction, mild LAE   Diverticular disease    Duodenitis    Elevated PSA    GERD (gastroesophageal reflux disease)    Gout    Humerus fracture    right   Hydronephrosis with ureteropelvic junction obstruction    Hypercalcemia 02/02/2012   a. 01/2012: 10.7.   Hyperlipidemia    Hypertension    Pancreatitis  a. ? Simvastatin related   Pneumonia age 18 or 7   "I think only once" (06/19/2013)   Syncope    a. 06/2013   TIA (transient ischemic attack)    a. 01/2012 - presentation as acute imbalance;  b. 01/2012 carotid U/S w/o signif stenosis;  c. 01/2012 Echo: EF 60-65%, Gr 1 DD.;  d.  Carotid US (06/20/2013): 1-39% bilateral ICA stenosis   Tubular adenoma polyp of rectum    Type II diabetes mellitus (HCC)    "diet and exercise controlled at present" (06/19/2013)    Past Surgical History:  Procedure Laterality Date   APPENDECTOMY     CARDIAC CATHETERIZATION  2000   CHOLECYSTECTOMY     COCHLEAR IMPLANT Right 1990's   COLONOSCOPY W/ POLYPECTOMY     no polyp 2012   CORONARY ARTERY BYPASS GRAFT  2000   CABG X5   INGUINAL HERNIA REPAIR Right 09/15/2015   Procedure: OPEN REPAIR RIGHT INGUINAL HERNIA ;  Surgeon: Avel Peace, MD;  Location: [facility];  Service: General;  Laterality: Right;   INSERTION OF MESH Right 09/15/2015   Procedure: INSERTION OF MESH;  Surgeon: Avel Peace, MD;  Location: [facility];  Service: General;  Laterality: Right;   PARATHYROIDECTOMY  1978   POLYPECTOMY     REVERSE SHOULDER ARTHROPLASTY Right 12/30/2014   Procedure: Right shoulder ORIF, Biomet S3 Plate and ZImmer cable system ;  Surgeon: Beverely Low, MD;  Location: [facility] OR;  Service: Orthopedics;  Laterality: Right;   right ankle surgery  age 38   TONSILLECTOMY  55   trachestomy as child for throat closing      Allergies  Allergen Reactions   Simvastatin Other (See Comments)    Possible statin-induced pancreatitis    Outpatient Encounter Medications as of 10/29/2022  Medication Sig   acetaminophen (TYLENOL) 500 MG tablet Take 1,000 mg by mouth 2 (two) times daily.   allopurinol (ZYLOPRIM) 100 MG tablet Take 100 mg by mouth daily.   amLODipine (NORVASC) 5 MG tablet Take 1 tablet (5 mg total) by mouth daily.   Choline Fenofibrate (FENOFIBRIC ACID) 135 MG CPDR Take 1 tablet by mouth in the morning.   clopidogrel (PLAVIX) 75 MG tablet Take 75 mg by mouth daily.   colchicine 0.6 MG tablet Take 0.6 mg by mouth daily as needed (as directed for gout flares).   ezetimibe (ZETIA) 10 MG tablet Take 10 mg by mouth daily.   finasteride (PROSCAR) 5 MG tablet Take 5 mg by mouth every evening.   ipratropium-albuterol (DUONEB) 0.5-2.5 (3) MG/3ML SOLN Take 3 mLs by nebulization every 6 (six) hours as needed.   ketoconazole (NIZORAL) 2 % cream Apply 1 Application topically 2 (two)  times daily for 14 days.   METFORMIN HCL PO Take 250 mg by mouth 2 (two) times daily.   zinc oxide 20 % ointment Apply 1 application  topically as needed for irritation. Apply transdermally as needed for skin irritation after each incontinent episode.   No facility-administered encounter medications on file as of 10/29/2022.    Review of Systems  Constitutional:  Negative for activity change and appetite change.  HENT:  Positive for hearing loss. Negative for trouble swallowing.   Eyes:  Negative for visual disturbance.  Respiratory:  Negative for cough, shortness of breath and wheezing.   Cardiovascular:  Negative for chest pain and leg swelling.  Gastrointestinal:  Negative for abdominal distention and abdominal pain.  Genitourinary:  Negative for dysuria.       Incontinence  Musculoskeletal:  Positive for gait problem.  Skin:  Negative for wound.  Neurological:  Positive for weakness. Negative for dizziness.  Psychiatric/Behavioral:  Negative for confusion, dysphoric mood and sleep disturbance. The patient is not nervous/anxious.     Immunization History  Administered Date(s) Administered   Fluad Quad(high Dose 65+) 04/01/2019, 04/11/2022   Influenza Split 04/13/2011, 05/02/2012   Influenza Whole 05/22/2007, 04/12/2008, 05/02/2009, 04/14/2010   Influenza, High Dose Seasonal PF 04/17/2013, 03/16/2014, 03/05/2016, 03/18/2017   Influenza,inj,Quad PF,6+ Mos 03/02/2015, 03/18/2018   Influenza-Unspecified 03/18/2018, 04/12/2021   Moderna Covid-19 Vaccine Bivalent Booster 71yrs & up 11/29/2021   Moderna SARS-COV2 Booster Vaccination 03/08/2021   Moderna Sars-Covid-2 Vaccination 06/22/2019, 07/20/2019, 05/02/2020, 11/23/2020   PPD Test 01/02/2015   Pneumococcal Conjugate-13 12/29/2015   Pneumococcal Polysaccharide-23 04/19/2006   Respiratory Syncytial Virus Vaccine,Recomb Aduvanted(Arexvy) 05/14/2022   Tdap 09/04/2016   Unspecified SARS-COV-2 Vaccination 04/24/2022   Zoster  Recombinat (Shingrix) 09/17/2019, 03/29/2020   Zoster, Live 06/29/2015   Pertinent  Health Maintenance Due  Topic Date Due   OPHTHALMOLOGY EXAM  12/01/2022   INFLUENZA VACCINE  01/17/2023   HEMOGLOBIN A1C  03/14/2023   FOOT EXAM  09/05/2023      01/10/2022   10:24 AM 01/10/2022   12:47 PM 04/30/2022    2:09 PM 05/23/2022    9:44 AM 08/10/2022    3:12 PM  Fall Risk  Falls in the past year? 0 1 0 0 0  Was there an injury with Fall? 0 0 0 0 0  Fall Risk Category Calculator 0 1 0 0 0  Fall Risk Category (Retired) Low Low Low Low   (RETIRED) Patient Fall Risk Level Low fall risk High fall risk Low fall risk Low fall risk   Patient at Risk for Falls Due to No Fall Risks History of fall(s);Impaired mobility No Fall Risks No Fall Risks History of fall(s);Impaired balance/gait;Impaired mobility  Fall risk Follow up Falls evaluation completed Falls evaluation completed;Education provided;Falls prevention discussed Falls evaluation completed Falls evaluation completed Falls evaluation completed;Education provided   Functional Status Survey:    Vitals:   10/29/22 0926  BP: (!) 143/82  Pulse: 84  Resp: 20  Temp: (!) 97.1 F (36.2 C)  SpO2: 95%  Weight: 172 lb (78 kg)  Height: 5\' 9"  (1.753 m)   Body mass index is 25.4 kg/m. Physical Exam Vitals reviewed.  Constitutional:      General: He is not in acute distress. HENT:     Head: Normocephalic.     Right Ear: There is no impacted cerumen.     Left Ear: There is no impacted cerumen.     Nose: Nose normal.     Mouth/Throat:     Mouth: Mucous membranes are moist.  Eyes:     General:        Right eye: No discharge.        Left eye: No discharge.  Cardiovascular:     Rate and Rhythm: Normal rate and regular rhythm.     Pulses:          Dorsalis pedis pulses are 1+ on the right side and 1+ on the left side.       Posterior tibial pulses are 1+ on the right side and 1+ on the left side.     Heart sounds: Normal heart sounds.   Pulmonary:     Effort: Pulmonary effort is normal. No respiratory distress.     Breath sounds: Normal breath sounds. No wheezing.  Abdominal:  General: Bowel sounds are normal. There is no distension.     Palpations: Abdomen is soft.     Tenderness: There is no abdominal tenderness.  Musculoskeletal:     Cervical back: Neck supple.     Right lower leg: No edema.     Left lower leg: No edema.  Feet:     Right foot:     Protective Sensation: 10 sites tested.  10 sites sensed.     Skin integrity: Skin integrity normal.     Toenail Condition: Right toenails are abnormally thick.     Left foot:     Protective Sensation: 10 sites tested.  10 sites sensed.     Skin integrity: Skin integrity normal.     Toenail Condition: Left toenails are abnormally thick.  Skin:    General: Skin is warm.     Capillary Refill: Capillary refill takes less than 2 seconds.  Neurological:     General: No focal deficit present.     Mental Status: He is alert and oriented to person, place, and time.     Motor: Weakness present.     Gait: Gait abnormal.     Comments: wheelchair  Psychiatric:        Mood and Affect: Mood normal.     Labs reviewed: Recent Labs    09/11/22 0000  NA 142  K 4.4  CL 107  CO2 26*  BUN 33*  CREATININE 1.4*  CALCIUM 10.7   Recent Labs    09/11/22 0000  AST 17  ALT 15  ALKPHOS 35  ALBUMIN 4.3   Recent Labs    09/11/22 0000 09/24/22 0000  WBC 4.6 6.5  NEUTROABS 2,470.00 3,907.00  HGB 13.6 13.9  HCT 39* 40*  PLT 261 282   Lab Results  Component Value Date   TSH 1.79 10/26/2021   Lab Results  Component Value Date   HGBA1C 7.6 09/11/2022   Lab Results  Component Value Date   CHOL 212 (A) 09/11/2022   HDL 41 09/11/2022   LDLCALC 140 09/11/2022   LDLDIRECT 96.0 08/23/2020   TRIG 174 (A) 09/11/2022   CHOLHDL 4.4 10/21/2020    Significant Diagnostic Results in last 30 days:  No results found.  Assessment/Plan 1. Nocturnal enuresis -  ongoing - no issues during day - cont skilled nursing  2. Type 2 diabetes mellitus with stage 3a chronic kidney disease, without long-term current use of insulin (HCC) - A1c now 7.6> was 7.2, goal < 7.0 - suspect due to weight gain - will increase metformin to 500 mg in AM - foot exam today - eye exam 11/30/2021 - cont metformin  3. TIA (transient ischemic attack) - cont Plavix  4. Hx of CABG - cont Plavix and Zetia  5. Essential hypertension, benign - controlled, goal < 150/90 - cont amlodipine  6. Mixed hyperlipidemia - LDL 120, goal < 70 - statin intolerance - cont Zetia  7. Stage 3a chronic kidney disease (HCC) - encourage hydration with water - avoid NSAIDS  8. Memory deficit - appropriate, recent BIMS 15/15> unchanged  - no behaviors  9. History of gout - no recent flares - cont allopurinol and colchicine  10. Benign prostatic hyperplasia with urinary obstruction - stable with finasteride  11. Sensory hearing loss - followed by Dr. Manson Passey - has right ear cochlear implant     Family/ staff Communication: plan discussed with patient and nurse  Labs/tests ordered:  none

## 2022-11-06 ENCOUNTER — Non-Acute Institutional Stay (INDEPENDENT_AMBULATORY_CARE_PROVIDER_SITE_OTHER): Payer: Federal, State, Local not specified - PPO | Admitting: Adult Health

## 2022-11-06 ENCOUNTER — Encounter: Payer: Self-pay | Admitting: Adult Health

## 2022-11-06 DIAGNOSIS — I10 Essential (primary) hypertension: Secondary | ICD-10-CM | POA: Diagnosis not present

## 2022-11-06 DIAGNOSIS — B372 Candidiasis of skin and nail: Secondary | ICD-10-CM

## 2022-11-06 DIAGNOSIS — Z66 Do not resuscitate: Secondary | ICD-10-CM | POA: Diagnosis not present

## 2022-11-06 NOTE — Progress Notes (Signed)
Location:  [facility] Room Number: 11A Place of Service:  SNF (31) Provider:  Kenard Gower, DNP, FNP-BC  Patient Care Team: [name], NP as PCP - General (Adult Health Nurse Practitioner) [name], MD as PCP - Cardiology (Cardiology)  Extended Emergency Contact Information Primary Emergency Contact: [name],[name]          [place], [place] Home Phone: [phone] Mobile Phone: [phone] Relation: Daughter Secondary Emergency Contact: [name],[name] Address: [address]          [place], [place] [address] [name] Home Phone: [phone] Mobile Phone: [phone] Relation: Daughter  Code Status:  DNR  Goals of care: Advanced Directive information    11/06/2022   10:54 AM  Advanced Directives  Does Patient Have a Medical Advance Directive? Yes  Type of Estate agent of [place];Living will;Out of facility DNR (pink MOST or yellow form)  Does patient want to make changes to medical advance directive? No - Patient declined  Copy of Healthcare Power of Attorney in Chart? Yes - validated most recent copy scanned in chart (See row information)     Chief Complaint  Patient presents with   Acute Visit    Rashes on groin    HPI:  Pt is a 87 y.o. male seen today for an acute visit regarding rashes on his groin. He is a long-term care resident of [facility]. He has erythematous rashes on his bilateral groin and suprapubic area. Staff reported that he has urinary incontinence at night. Discussed importance of keeping groin are dry. He stated that he will now get up at night to go to the bathroom so his groin will be dry.   BP 132/80, Amlodipine was increased from 2.5 mg to 5 mg on 10/23/22   Past Medical History:  Diagnosis Date   Cancer (HCC)    skin   CKD (chronic kidney disease) 06/24/2013   Cochlear implant in place    Coronary artery disease    a. s/p CABG 2000;  b. myoview 9/08: inf-septal scar with mild peri-infarct ischemia (low risk);  c.  Echo 8/13: mild focal basal septal hypertrophy, Gr 1 DD, mild LAE;  d. Lexiscan Myoview (06/20/2013): No reversible ischemia, inferior and septal infarct, inferior and septal hypokinesis, EF 60%;  e.  Echo (06/20/2011): EF 50-55%, normal wall motion, grade 1 diastolic dysfunction, mild LAE   Diverticular disease    Duodenitis    Elevated PSA    GERD (gastroesophageal reflux disease)    Gout    Humerus fracture    right   Hydronephrosis with ureteropelvic junction obstruction    Hypercalcemia 02/02/2012   a. 01/2012: 10.7.   Hyperlipidemia    Hypertension    Pancreatitis    a. ? Simvastatin related   Pneumonia age 46 or 7   "I think only once" (06/19/2013)   Syncope    a. 06/2013   TIA (transient ischemic attack)    a. 01/2012 - presentation as acute imbalance;  b. 01/2012 carotid U/S w/o signif stenosis;  c. 01/2012 Echo: EF 60-65%, Gr 1 DD.;  d.  Carotid US (06/20/2013): 1-39% bilateral ICA stenosis   Tubular adenoma polyp of rectum    Type II diabetes mellitus (HCC)    "diet and exercise controlled at present" (06/19/2013)   Past Surgical History:  Procedure Laterality Date   APPENDECTOMY     CARDIAC CATHETERIZATION  2000   CHOLECYSTECTOMY  COCHLEAR IMPLANT Right 1990's   COLONOSCOPY W/ POLYPECTOMY     no polyp 2012   CORONARY ARTERY BYPASS GRAFT  2000   CABG X5   INGUINAL HERNIA REPAIR Right 09/15/2015   Procedure: OPEN REPAIR RIGHT INGUINAL HERNIA ;  Surgeon: Avel Peace, MD;  Location: [facility];  Service: General;  Laterality: Right;   INSERTION OF MESH Right 09/15/2015   Procedure: INSERTION OF MESH;  Surgeon: Avel Peace, MD;  Location: [facility];  Service: General;  Laterality: Right;   PARATHYROIDECTOMY  1978   POLYPECTOMY     REVERSE SHOULDER ARTHROPLASTY Right 12/30/2014   Procedure: Right shoulder ORIF, Biomet S3 Plate and ZImmer cable system ;  Surgeon: Beverely Low, MD;  Location: [facility] OR;   Service: Orthopedics;  Laterality: Right;   right ankle surgery  age 82   TONSILLECTOMY  15   trachestomy as child for throat closing      Allergies  Allergen Reactions   Simvastatin Other (See Comments)    Possible statin-induced pancreatitis    Outpatient Encounter Medications as of 11/06/2022  Medication Sig   acetaminophen (TYLENOL) 500 MG tablet Take 1,000 mg by mouth 2 (two) times daily.   allopurinol (ZYLOPRIM) 100 MG tablet Take 100 mg by mouth daily.   amLODipine (NORVASC) 5 MG tablet Take 1 tablet (5 mg total) by mouth daily.   Choline Fenofibrate (FENOFIBRIC ACID) 135 MG CPDR Take 1 tablet by mouth in the morning.   clopidogrel (PLAVIX) 75 MG tablet Take 75 mg by mouth daily.   colchicine 0.6 MG tablet Take 0.6 mg by mouth daily as needed (as directed for gout flares).   ezetimibe (ZETIA) 10 MG tablet Take 10 mg by mouth daily.   finasteride (PROSCAR) 5 MG tablet Take 5 mg by mouth every evening.   ipratropium-albuterol (DUONEB) 0.5-2.5 (3) MG/3ML SOLN Take 3 mLs by nebulization every 6 (six) hours as needed.   ketoconazole (NIZORAL) 2 % cream Apply 1 Application topically 2 (two) times daily for 14 days.   metFORMIN (GLUCOPHAGE) 500 MG tablet Take 1 tablet (500 mg total) by mouth daily with breakfast AND 0.5 tablets (250 mg total) daily with supper.   zinc oxide 20 % ointment Apply 1 application  topically as needed for irritation. Apply transdermally as needed for skin irritation after each incontinent episode.   No facility-administered encounter medications on file as of 11/06/2022.    Review of Systems  Constitutional:  Negative for activity change, appetite change and fever.  HENT:  Negative for sore throat.   Eyes: Negative.   Cardiovascular:  Negative for chest pain and leg swelling.  Gastrointestinal:  Negative for abdominal distention, diarrhea and vomiting.  Genitourinary:  Negative for dysuria, frequency and urgency.  Skin:  Positive for rash. Negative for  color change.  Neurological:  Negative for dizziness and headaches.  Psychiatric/Behavioral:  Negative for behavioral problems and sleep disturbance. The patient is not nervous/anxious.        Immunization History  Administered Date(s) Administered   Fluad Quad(high Dose 65+) 04/01/2019, 04/11/2022   Influenza Split 04/13/2011, 05/02/2012   Influenza Whole 05/22/2007, 04/12/2008, 05/02/2009, 04/14/2010   Influenza, High Dose Seasonal PF 04/17/2013, 03/16/2014, 03/05/2016, 03/18/2017   Influenza,inj,Quad PF,6+ Mos 03/02/2015, 03/18/2018   Influenza-Unspecified 03/18/2018, 04/12/2021   Moderna Covid-19 Vaccine Bivalent Booster 25yrs & up 11/29/2021   Moderna SARS-COV2 Booster Vaccination 03/08/2021   Moderna Sars-Covid-2 Vaccination 06/22/2019, 07/20/2019, 05/02/2020, 11/23/2020   PPD Test 01/02/2015   Pneumococcal Conjugate-13 12/29/2015  Pneumococcal Polysaccharide-23 04/19/2006   Respiratory Syncytial Virus Vaccine,Recomb Aduvanted(Arexvy) 05/14/2022   Tdap 09/04/2016   Unspecified SARS-COV-2 Vaccination 04/24/2022   Zoster Recombinat (Shingrix) 09/17/2019, 03/29/2020   Zoster, Live 06/29/2015   Pertinent  Health Maintenance Due  Topic Date Due   OPHTHALMOLOGY EXAM  12/01/2022   INFLUENZA VACCINE  01/17/2023   HEMOGLOBIN A1C  03/14/2023   FOOT EXAM  10/29/2023      01/10/2022   10:24 AM 01/10/2022   12:47 PM 04/30/2022    2:09 PM 05/23/2022    9:44 AM 08/10/2022    3:12 PM  Fall Risk  Falls in the past year? 0 1 0 0 0  Was there an injury with Fall? 0 0 0 0 0  Fall Risk Category Calculator 0 1 0 0 0  Fall Risk Category (Retired) Low Low Low Low   (RETIRED) Patient Fall Risk Level Low fall risk High fall risk Low fall risk Low fall risk   Patient at Risk for Falls Due to No Fall Risks History of fall(s);Impaired mobility No Fall Risks No Fall Risks History of fall(s);Impaired balance/gait;Impaired mobility  Fall risk Follow up Falls evaluation completed Falls evaluation  completed;Education provided;Falls prevention discussed Falls evaluation completed Falls evaluation completed Falls evaluation completed;Education provided     Vitals:   11/06/22 1051  BP: 132/80  Pulse: 68  Temp: (!) 97.4 F (36.3 C)  Weight: 172 lb (78 kg)  Height: 5\' 9"  (1.753 m)   Body mass index is 25.4 kg/m.  Physical Exam Constitutional:      Appearance: Normal appearance.  HENT:     Head: Normocephalic and atraumatic.     Ears:     Comments: Right ear hearing device /cochlear implant    Mouth/Throat:     Mouth: Mucous membranes are moist.  Eyes:     Conjunctiva/sclera: Conjunctivae normal.  Cardiovascular:     Rate and Rhythm: Normal rate and regular rhythm.     Pulses: Normal pulses.     Heart sounds: Normal heart sounds.  Pulmonary:     Effort: Pulmonary effort is normal.     Breath sounds: Normal breath sounds.  Abdominal:     General: Bowel sounds are normal.     Palpations: Abdomen is soft.  Musculoskeletal:        General: No swelling. Normal range of motion.     Cervical back: Normal range of motion.  Skin:    General: Skin is warm and dry.     Findings: Rash present.     Comments: Erythematous rashes on bilateral groin and suprapubic area  Neurological:     General: No focal deficit present.     Mental Status: He is alert and oriented to person, place, and time.  Psychiatric:        Mood and Affect: Mood normal.        Behavior: Behavior normal.        Thought Content: Thought content normal.        Judgment: Judgment normal.        Labs reviewed: Recent Labs    09/11/22 0000  NA 142  K 4.4  CL 107  CO2 26*  BUN 33*  CREATININE 1.4*  CALCIUM 10.7   Recent Labs    09/11/22 0000  AST 17  ALT 15  ALKPHOS 35  ALBUMIN 4.3   Recent Labs    09/11/22 0000 09/24/22 0000  WBC 4.6 6.5  NEUTROABS 2,470.00 3,907.00  HGB 13.6 13.9  HCT 39* 40*  PLT 261 282   Lab Results  Component Value Date   TSH 1.79 10/26/2021   Lab  Results  Component Value Date   HGBA1C 7.6 09/11/2022   Lab Results  Component Value Date   CHOL 212 (A) 09/11/2022   HDL 41 09/11/2022   LDLCALC 140 09/11/2022   LDLDIRECT 96.0 08/23/2020   TRIG 174 (A) 09/11/2022   CHOLHDL 4.4 10/21/2020    Significant Diagnostic Results in last 30 days:  No results found.  Assessment/Plan  1. Candidal skin infection -  will start on Nystatin ointment 100,000 units/g apply to bilateral groin and suprapubic rashes -  will get up at night to go to bathroom at night  2. Essential hypertension, benign -  BP stable -  continue Amlodipine  3. DNR (do not resuscitate) - Do not attempt resuscitation (DNR)    Family/ staff Communication: Discussed plan of care with resident and charge nurse  Labs/tests ordered: None    Kenard Gower, DNP, MSN, FNP-BC [facility] [phone] (Monday-Friday 8:00 a.m. - 5:00 p.m.) [phone] (after hours)

## 2022-11-28 ENCOUNTER — Encounter: Payer: Self-pay | Admitting: Orthopedic Surgery

## 2022-11-28 ENCOUNTER — Non-Acute Institutional Stay (SKILLED_NURSING_FACILITY): Payer: Federal, State, Local not specified - PPO | Admitting: Orthopedic Surgery

## 2022-11-28 DIAGNOSIS — R413 Other amnesia: Secondary | ICD-10-CM

## 2022-11-28 DIAGNOSIS — Z951 Presence of aortocoronary bypass graft: Secondary | ICD-10-CM

## 2022-11-28 DIAGNOSIS — Z8739 Personal history of other diseases of the musculoskeletal system and connective tissue: Secondary | ICD-10-CM

## 2022-11-28 DIAGNOSIS — N401 Enlarged prostate with lower urinary tract symptoms: Secondary | ICD-10-CM

## 2022-11-28 DIAGNOSIS — N1831 Chronic kidney disease, stage 3a: Secondary | ICD-10-CM

## 2022-11-28 DIAGNOSIS — B372 Candidiasis of skin and nail: Secondary | ICD-10-CM | POA: Diagnosis not present

## 2022-11-28 DIAGNOSIS — E1122 Type 2 diabetes mellitus with diabetic chronic kidney disease: Secondary | ICD-10-CM | POA: Diagnosis not present

## 2022-11-28 DIAGNOSIS — N3944 Nocturnal enuresis: Secondary | ICD-10-CM

## 2022-11-28 DIAGNOSIS — G459 Transient cerebral ischemic attack, unspecified: Secondary | ICD-10-CM

## 2022-11-28 DIAGNOSIS — E782 Mixed hyperlipidemia: Secondary | ICD-10-CM

## 2022-11-28 DIAGNOSIS — H905 Unspecified sensorineural hearing loss: Secondary | ICD-10-CM

## 2022-11-28 DIAGNOSIS — N138 Other obstructive and reflux uropathy: Secondary | ICD-10-CM

## 2022-11-28 NOTE — Progress Notes (Signed)
Location:  [facility] Room Number: 11/A Place of Service:  SNF (31) Provider:  [name], NP   [name], NP  Patient Care Team: [name], NP as PCP - General (Adult Health Nurse Practitioner) [name], MD as PCP - Cardiology (Cardiology)  Extended Emergency Contact Information Primary Emergency Contact: [name],[name]          [place], [place] Home Phone: [phone] Mobile Phone: [phone] Relation: Daughter Secondary Emergency Contact: [name],[name] Address: [address]          [place], [place] [address] [name] Home Phone: [phone] Mobile Phone: [phone] Relation: Daughter  Code Status:  DNR Goals of care: Advanced Directive information    11/06/2022   10:54 AM  Advanced Directives  Does Patient Have a Medical Advance Directive? Yes  Type of Estate agent of [place];Living will;Out of facility DNR (pink MOST or yellow form)  Does patient want to make changes to medical advance directive? No - Patient declined  Copy of Healthcare Power of Attorney in Chart? Yes - validated most recent copy scanned in chart (See row information)     Chief Complaint  Patient presents with   Medical Management of Chronic Issues    HPI:  Pt is a 87 y.o. male seen today for medical management of chronic diseases.   He currently resides on the skilled nursing unit at [facility]. PMH: CAD, s/p CABG 2000, HTN, TIA 2022, T2DM, HOH- cochlear implant, OA, BPH, CKD, memory deficit, shuffling gait, and weight loss.     Nocturnal enuresis- UA/urine culture unremarkable 09/2022, improved with scheduled toileting at midnight and 4 am Candidal skin infection- involves groin folds, improved with nystatin  T2DM- A1c 7.6 (03/26)> was 7.2 (11/09),  blood sugars 180-200's, no hypoglycemias, urine microalbumin 31 04/27/2022, eye exam 11/30/2021, regular diet, remains on metformin HxTIA- no  further workup per neurology, remains on plavix and fenofibrate H/o CABG- evaluated by cardiology, 10/2020 LVEF 60-65%, remains on plavix HTN- BUN/creat 33/1.4 09/11/2022, remains on amlodipine HLD- LDL 140 09/11/2022, allergic to statins CKD- see above Memory deficit- BIMS score 15/15 (05/03)> was 03/2022, CT head 12/2020 atrophy and chronic microvascular ischemic changes noted, no behaviors Hx of gout-no recent flares, remains on allopurinol and colchicine prn BPH- remains on finasteride Sensory hearing loss- followed by Dr. Manson Passey, right cochlear implant in place   Recent blood pressures:  06/11- 145/82  06/04- 147/87  05/28- 134/77  Recent weights:  06/01- 175 lbs  05/02- 173.2 lbs  04/01- 170.6 lbs    Past Medical History:  Diagnosis Date   Cancer (HCC)    skin   CKD (chronic kidney disease) 06/24/2013   Cochlear implant in place    Coronary artery disease    a. s/p CABG 2000; b. myoview 9/08: inf-septal scar with mild peri-infarct ischemia (low risk);  c.  Echo 8/13: mild focal basal septal hypertrophy, Gr 1 DD, mild LAE;  d. Lexiscan Myoview (06/20/2013): No reversible ischemia, inferior and septal infarct, inferior and septal hypokinesis, EF 60%;  e.  Echo (06/20/2011): EF 50-55%, normal wall motion, grade 1 diastolic dysfunction, mild LAE   Diverticular disease    Duodenitis    Elevated PSA    GERD (gastroesophageal reflux disease)    Gout    Humerus fracture    right   Hydronephrosis with ureteropelvic junction obstruction    Hypercalcemia 02/02/2012   a. 01/2012: 10.7.   Hyperlipidemia  Hypertension    Pancreatitis    a. ? Simvastatin related   Pneumonia age 40 or 7   "I think only once" (06/19/2013)   Syncope    a. 06/2013   TIA (transient ischemic attack)    a. 01/2012 - presentation as acute imbalance;  b. 01/2012 carotid U/S w/o signif stenosis;  c. 01/2012 Echo: EF 60-65%, Gr 1 DD.;  d.  Carotid US (06/20/2013): 1-39% bilateral ICA stenosis   Tubular adenoma polyp  of rectum    Type II diabetes mellitus (HCC)    "diet and exercise controlled at present" (06/19/2013)   Past Surgical History:  Procedure Laterality Date   APPENDECTOMY     CARDIAC CATHETERIZATION  2000   CHOLECYSTECTOMY     COCHLEAR IMPLANT Right 1990's   COLONOSCOPY W/ POLYPECTOMY     no polyp 2012   CORONARY ARTERY BYPASS GRAFT  2000   CABG X5   INGUINAL HERNIA REPAIR Right 09/15/2015   Procedure: OPEN REPAIR RIGHT INGUINAL HERNIA ;  Surgeon: Avel Peace, MD;  Location: [facility];  Service: General;  Laterality: Right;   INSERTION OF MESH Right 09/15/2015   Procedure: INSERTION OF MESH;  Surgeon: Avel Peace, MD;  Location: [facility];  Service: General;  Laterality: Right;   PARATHYROIDECTOMY  1978   POLYPECTOMY     REVERSE SHOULDER ARTHROPLASTY Right 12/30/2014   Procedure: Right shoulder ORIF, Biomet S3 Plate and ZImmer cable system ;  Surgeon: Beverely Low, MD;  Location: [facility] OR;  Service: Orthopedics;  Laterality: Right;   right ankle surgery  age 36   TONSILLECTOMY  72   trachestomy as child for throat closing      Allergies  Allergen Reactions   Simvastatin Other (See Comments)    Possible statin-induced pancreatitis    Outpatient Encounter Medications as of 11/28/2022  Medication Sig   acetaminophen (TYLENOL) 500 MG tablet Take 1,000 mg by mouth 2 (two) times daily.   allopurinol (ZYLOPRIM) 100 MG tablet Take 100 mg by mouth daily.   amLODipine (NORVASC) 5 MG tablet Take 1 tablet (5 mg total) by mouth daily.   Choline Fenofibrate (FENOFIBRIC ACID) 135 MG CPDR Take 1 tablet by mouth in the morning.   clopidogrel (PLAVIX) 75 MG tablet Take 75 mg by mouth daily.   colchicine 0.6 MG tablet Take 0.6 mg by mouth daily as needed (as directed for gout flares).   ezetimibe (ZETIA) 10 MG tablet Take 10 mg by mouth daily.   finasteride (PROSCAR) 5 MG tablet Take 5 mg by mouth every evening.   ipratropium-albuterol (DUONEB) 0.5-2.5 (3) MG/3ML SOLN Take 3 mLs by nebulization  every 6 (six) hours as needed.   metFORMIN (GLUCOPHAGE) 500 MG tablet Take 1 tablet (500 mg total) by mouth daily with breakfast AND 0.5 tablets (250 mg total) daily with supper.   zinc oxide 20 % ointment Apply 1 application  topically as needed for irritation. Apply transdermally as needed for skin irritation after each incontinent episode.   No facility-administered encounter medications on file as of 11/28/2022.    Review of Systems  Constitutional:  Negative for activity change and appetite change.  HENT:  Positive for hearing loss. Negative for trouble swallowing.   Eyes:  Negative for visual disturbance.  Respiratory:  Negative for cough, shortness of breath and wheezing.   Cardiovascular:  Negative for chest pain and leg swelling.  Gastrointestinal:  Negative for abdominal distention and abdominal pain.  Endocrine: Negative for polydipsia, polyphagia and polyuria.  Genitourinary:  Positive for frequency. Negative for dysuria and hematuria.  Musculoskeletal:  Positive for gait problem.  Skin:  Negative for wound.  Neurological:  Positive for weakness. Negative for dizziness and headaches.  Psychiatric/Behavioral:  Positive for confusion. Negative for dysphoric mood and sleep disturbance. The patient is not nervous/anxious.     Immunization History  Administered Date(s) Administered   Fluad Quad(high Dose 65+) 04/01/2019, 04/11/2022   Influenza Split 04/13/2011, 05/02/2012   Influenza Whole 05/22/2007, 04/12/2008, 05/02/2009, 04/14/2010   Influenza, High Dose Seasonal PF 04/17/2013, 03/16/2014, 03/05/2016, 03/18/2017   Influenza,inj,Quad PF,6+ Mos 03/02/2015, 03/18/2018   Influenza-Unspecified 03/18/2018, 04/12/2021   Moderna Covid-19 Vaccine Bivalent Booster 66yrs & up 11/29/2021   Moderna SARS-COV2 Booster Vaccination 03/08/2021   Moderna Sars-Covid-2 Vaccination 06/22/2019, 07/20/2019, 05/02/2020, 11/23/2020   PPD Test 01/02/2015   Pneumococcal Conjugate-13 12/29/2015    Pneumococcal Polysaccharide-23 04/19/2006   Respiratory Syncytial Virus Vaccine,Recomb Aduvanted(Arexvy) 05/14/2022   Tdap 09/04/2016   Unspecified SARS-COV-2 Vaccination 04/24/2022   Zoster Recombinat (Shingrix) 09/17/2019, 03/29/2020   Zoster, Live 06/29/2015   Pertinent  Health Maintenance Due  Topic Date Due   OPHTHALMOLOGY EXAM  12/01/2022   INFLUENZA VACCINE  01/17/2023   HEMOGLOBIN A1C  03/14/2023   FOOT EXAM  10/29/2023      01/10/2022   10:24 AM 01/10/2022   12:47 PM 04/30/2022    2:09 PM 05/23/2022    9:44 AM 08/10/2022    3:12 PM  Fall Risk  Falls in the past year? 0 1 0 0 0  Was there an injury with Fall? 0 0 0 0 0  Fall Risk Category Calculator 0 1 0 0 0  Fall Risk Category (Retired) Low Low Low Low   (RETIRED) Patient Fall Risk Level Low fall risk High fall risk Low fall risk Low fall risk   Patient at Risk for Falls Due to No Fall Risks History of fall(s);Impaired mobility No Fall Risks No Fall Risks History of fall(s);Impaired balance/gait;Impaired mobility  Fall risk Follow up Falls evaluation completed Falls evaluation completed;Education provided;Falls prevention discussed Falls evaluation completed Falls evaluation completed Falls evaluation completed;Education provided   Functional Status Survey:    Vitals:   11/28/22 1518 11/28/22 1519  BP: (!) 145/82 138/78  Pulse: 75   Resp: 15   Temp: 97.9 F (36.6 C)   SpO2: 96%   Weight: 173 lb (78.5 kg)   Height: 5\' 9"  (1.753 m)    Body mass index is 25.55 kg/m. Physical Exam Vitals reviewed.  Constitutional:      General: He is not in acute distress. HENT:     Head: Normocephalic.     Right Ear: There is no impacted cerumen.     Left Ear: There is no impacted cerumen.     Ears:     Comments: Right cochlear implant    Nose: Nose normal.     Mouth/Throat:     Mouth: Mucous membranes are moist.  Eyes:     General:        Right eye: No discharge.        Left eye: No discharge.  Cardiovascular:      Rate and Rhythm: Normal rate and regular rhythm.     Pulses: Normal pulses.     Heart sounds: Normal heart sounds.  Pulmonary:     Effort: Pulmonary effort is normal. No respiratory distress.     Breath sounds: Normal breath sounds. No wheezing.  Abdominal:     General: Bowel sounds are normal.  Palpations: Abdomen is soft.  Musculoskeletal:     Cervical back: Neck supple.     Right lower leg: No edema.     Left lower leg: No edema.  Skin:    General: Skin is warm.     Capillary Refill: Capillary refill takes less than 2 seconds.  Neurological:     General: No focal deficit present.     Mental Status: He is alert and oriented to person, place, and time.     Motor: Weakness present.     Gait: Gait abnormal.     Comments: wheelchair  Psychiatric:        Mood and Affect: Mood normal.     Labs reviewed: Recent Labs    09/11/22 0000  NA 142  K 4.4  CL 107  CO2 26*  BUN 33*  CREATININE 1.4*  CALCIUM 10.7   Recent Labs    09/11/22 0000  AST 17  ALT 15  ALKPHOS 35  ALBUMIN 4.3   Recent Labs    09/11/22 0000 09/24/22 0000  WBC 4.6 6.5  NEUTROABS 2,470.00 3,907.00  HGB 13.6 13.9  HCT 39* 40*  PLT 261 282   Lab Results  Component Value Date   TSH 1.79 10/26/2021   Lab Results  Component Value Date   HGBA1C 7.6 09/11/2022   Lab Results  Component Value Date   CHOL 212 (A) 09/11/2022   HDL 41 09/11/2022   LDLCALC 140 09/11/2022   LDLDIRECT 96.0 08/23/2020   TRIG 174 (A) 09/11/2022   CHOLHDL 4.4 10/21/2020    Significant Diagnostic Results in last 30 days:  No results found.  Assessment/Plan 1. Nocturnal enuresis - failed AL trial  - improved with scheduled toileting at midnight and 4am  2. Candidal skin infection - involved grin folds - improved with nystatin  3. Type 2 diabetes mellitus with stage 3a chronic kidney disease, without long-term current use of insulin (HCC) - A1c 7.6 08/2022 - no hypoglycemia - sugars 180-200's - eye exam  11/30/2021> scheduled 12/03/2022 with [facility] - cont metformin  4. TIA (transient ischemic attack) - cont Plavix  5. Hx of CABG - cont Plavix and Zetia  6. Mixed hyperlipidemia - statin intolerance - cont Zetia  7. Stage 3a chronic kidney disease (HCC) - encourage hydration with water - avoid NSAIDS  8. Memory deficit - no behaviors - doing well in SNF  9. History of gout - no recent flares - cont allopurinol and colchicine  10. Benign prostatic hyperplasia with urinary obstruction - cont finasteride  11. Sensory hearing loss - right cochlear implant - followed by Dr. Manson Passey    Family/ staff Communication: plan discussed with patient and nurse  Labs/tests ordered:  A1c, TSH 12/06/2022

## 2022-12-03 ENCOUNTER — Encounter: Payer: Self-pay | Admitting: Internal Medicine

## 2022-12-03 LAB — HM DIABETES EYE EXAM

## 2022-12-05 ENCOUNTER — Encounter: Payer: Self-pay | Admitting: Cardiovascular Disease

## 2022-12-07 LAB — TSH: TSH: 2.15 (ref 0.41–5.90)

## 2022-12-07 LAB — HEMOGLOBIN A1C: Hemoglobin A1C: 7.7

## 2022-12-27 ENCOUNTER — Non-Acute Institutional Stay (SKILLED_NURSING_FACILITY): Payer: Federal, State, Local not specified - PPO | Admitting: Internal Medicine

## 2022-12-27 DIAGNOSIS — E782 Mixed hyperlipidemia: Secondary | ICD-10-CM | POA: Diagnosis not present

## 2022-12-27 DIAGNOSIS — G459 Transient cerebral ischemic attack, unspecified: Secondary | ICD-10-CM | POA: Diagnosis not present

## 2022-12-27 DIAGNOSIS — N138 Other obstructive and reflux uropathy: Secondary | ICD-10-CM

## 2022-12-27 DIAGNOSIS — E1122 Type 2 diabetes mellitus with diabetic chronic kidney disease: Secondary | ICD-10-CM | POA: Diagnosis not present

## 2022-12-27 DIAGNOSIS — Z8739 Personal history of other diseases of the musculoskeletal system and connective tissue: Secondary | ICD-10-CM

## 2022-12-27 DIAGNOSIS — N1831 Chronic kidney disease, stage 3a: Secondary | ICD-10-CM

## 2022-12-27 DIAGNOSIS — Z951 Presence of aortocoronary bypass graft: Secondary | ICD-10-CM

## 2022-12-27 DIAGNOSIS — N401 Enlarged prostate with lower urinary tract symptoms: Secondary | ICD-10-CM

## 2022-12-27 DIAGNOSIS — I10 Essential (primary) hypertension: Secondary | ICD-10-CM

## 2022-12-30 ENCOUNTER — Encounter: Payer: Self-pay | Admitting: Internal Medicine

## 2022-12-30 NOTE — Progress Notes (Signed)
[facility]  [facility] Service:  SNF (31)  Provider:   Code Status: DNR Goals of Care:     11/06/2022   10:54 AM  Advanced Directives  Does Patient Have a Medical Advance Directive? Yes  Type of Estate agent of [place];Living will;Out of facility DNR (pink MOST or yellow form)  Does patient want to make changes to medical advance directive? No - Patient declined  Copy of Healthcare Power of Attorney in Chart? Yes - validated most recent copy scanned in chart (See row information)     Chief Complaint  Patient presents with   Chronic Care Management    HPI: Patient is a 87 y.o. male seen today for medical management of chronic diseases.    Lives in SNF   Patient has a history of CAD, S/P CABG in 2000, gout, HLD, hypertension and diabetes Unstable Gait No further work up per Neurology Has Auditory Implants  Patient is stable No Nursing issues No falls Can do transfers Wheelchair Dependent Cognitively stable Wt Readings from Last 3 Encounters:  12/27/22 175 lb (79.4 kg)  11/28/22 173 lb (78.5 kg)  11/06/22 172 lb (78 kg)       Past Medical History:  Diagnosis Date   Cancer (HCC)    skin   CKD (chronic kidney disease) 06/24/2013   Cochlear implant in place    Coronary artery disease    a. s/p CABG 2000; b. myoview 9/08: inf-septal scar with mild peri-infarct ischemia (low risk);  c.  Echo 8/13: mild focal basal septal hypertrophy, Gr 1 DD, mild LAE;  d. Lexiscan Myoview (06/20/2013): No reversible ischemia, inferior and septal infarct, inferior and septal hypokinesis, EF 60%;  e.  Echo (06/20/2011): EF 50-55%, normal wall motion, grade 1 diastolic dysfunction, mild LAE   Diverticular disease    Duodenitis    Elevated PSA    GERD (gastroesophageal reflux disease)    Gout    Humerus fracture    right   Hydronephrosis with ureteropelvic junction obstruction    Hypercalcemia 02/02/2012   a. 01/2012: 10.7.   Hyperlipidemia     Hypertension    Pancreatitis    a. ? Simvastatin related   Pneumonia age 54 or 7   "I think only once" (06/19/2013)   Syncope    a. 06/2013   TIA (transient ischemic attack)    a. 01/2012 - presentation as acute imbalance;  b. 01/2012 carotid U/S w/o signif stenosis;  c. 01/2012 Echo: EF 60-65%, Gr 1 DD.;  d.  Carotid US (06/20/2013): 1-39% bilateral ICA stenosis   Tubular adenoma polyp of rectum    Type II diabetes mellitus (HCC)    "diet and exercise controlled at present" (06/19/2013)    Past Surgical History:  Procedure Laterality Date   APPENDECTOMY     CARDIAC CATHETERIZATION  2000   CHOLECYSTECTOMY     COCHLEAR IMPLANT Right 1990's   COLONOSCOPY W/ POLYPECTOMY     no polyp 2012   CORONARY ARTERY BYPASS GRAFT  2000   CABG X5   INGUINAL HERNIA REPAIR Right 09/15/2015   Procedure: OPEN REPAIR RIGHT INGUINAL HERNIA ;  Surgeon: Avel Peace, MD;  Location: [facility];  Service: General;  Laterality: Right;   INSERTION OF MESH Right 09/15/2015   Procedure: INSERTION OF MESH;  Surgeon: Avel Peace, MD;  Location: [facility];  Service: General;  Laterality: Right;   PARATHYROIDECTOMY  1978   POLYPECTOMY     REVERSE SHOULDER  ARTHROPLASTY Right 12/30/2014   Procedure: Right shoulder ORIF, Biomet S3 Plate and ZImmer cable system ;  Surgeon: Beverely Low, MD;  Location: [facility] OR;  Service: Orthopedics;  Laterality: Right;   right ankle surgery  age 85   TONSILLECTOMY  47   trachestomy as child for throat closing      Allergies  Allergen Reactions   Simvastatin Other (See Comments)    Possible statin-induced pancreatitis    Outpatient Encounter Medications as of 12/27/2022  Medication Sig   acetaminophen (TYLENOL) 500 MG tablet Take 1,000 mg by mouth 2 (two) times daily.   allopurinol (ZYLOPRIM) 100 MG tablet Take 100 mg by mouth daily.   amLODipine (NORVASC) 5 MG tablet Take 1 tablet (5 mg total) by mouth daily.   Choline Fenofibrate (FENOFIBRIC ACID) 135 MG CPDR Take 1 tablet by mouth in  the morning.   clopidogrel (PLAVIX) 75 MG tablet Take 75 mg by mouth daily.   colchicine 0.6 MG tablet Take 0.6 mg by mouth daily as needed (as directed for gout flares).   ezetimibe (ZETIA) 10 MG tablet Take 10 mg by mouth daily.   finasteride (PROSCAR) 5 MG tablet Take 5 mg by mouth every evening.   ipratropium-albuterol (DUONEB) 0.5-2.5 (3) MG/3ML SOLN Take 3 mLs by nebulization every 6 (six) hours as needed.   metFORMIN (GLUCOPHAGE) 500 MG tablet Take 1 tablet (500 mg total) by mouth daily with breakfast AND 0.5 tablets (250 mg total) daily with supper.   zinc oxide 20 % ointment Apply 1 application  topically as needed for irritation. Apply transdermally as needed for skin irritation after each incontinent episode.   No facility-administered encounter medications on file as of 12/27/2022.    Review of Systems:  Review of Systems  Constitutional:  Negative for activity change, appetite change and unexpected weight change.  HENT: Negative.    Respiratory:  Negative for cough and shortness of breath.   Cardiovascular:  Negative for leg swelling.  Gastrointestinal:  Negative for constipation.  Genitourinary:  Positive for frequency.  Musculoskeletal:  Positive for gait problem. Negative for arthralgias and myalgias.  Skin: Negative.  Negative for rash.  Neurological:  Negative for dizziness and weakness.  Psychiatric/Behavioral:  Positive for confusion. Negative for sleep disturbance.   All other systems reviewed and are negative.   Health Maintenance  Topic Date Due   COVID-19 Vaccine (7 - 2023-24 season) 02/15/2023 (Originally 06/19/2022)   INFLUENZA VACCINE  01/17/2023   HEMOGLOBIN A1C  03/14/2023   Diabetic kidney evaluation - Urine ACR  04/28/2023   Diabetic kidney evaluation - eGFR measurement  09/11/2023   FOOT EXAM  10/29/2023   OPHTHALMOLOGY EXAM  12/03/2023   DTaP/Tdap/Td (2 - Td or Tdap) 09/05/2026   Pneumonia Vaccine 58+ Years old  Completed   Zoster Vaccines- Shingrix   Completed   HPV VACCINES  Aged Out    Physical Exam: Vitals:   12/27/22 1603  BP: 138/82  Pulse: 81  Resp: 18  Temp: (!) 97.5 F (36.4 C)  Weight: 175 lb (79.4 kg)   Body mass index is 25.84 kg/m. Physical Exam Vitals reviewed.  Constitutional:      Appearance: Normal appearance.  HENT:     Head: Normocephalic.     Nose: Nose normal.     Mouth/Throat:     Mouth: Mucous membranes are moist.     Pharynx: Oropharynx is clear.  Eyes:     Pupils: Pupils are equal, round, and reactive to light.  Cardiovascular:  Rate and Rhythm: Normal rate and regular rhythm.     Pulses: Normal pulses.     Heart sounds: No murmur heard. Pulmonary:     Effort: Pulmonary effort is normal. No respiratory distress.     Breath sounds: Normal breath sounds. No rales.  Abdominal:     General: Abdomen is flat. Bowel sounds are normal.     Palpations: Abdomen is soft.  Musculoskeletal:        General: No swelling.     Cervical back: Neck supple.  Skin:    General: Skin is warm.  Neurological:     General: No focal deficit present.     Mental Status: He is alert.  Psychiatric:        Mood and Affect: Mood normal.        Thought Content: Thought content normal.     Labs reviewed: Basic Metabolic Panel: Recent Labs    09/11/22 0000  NA 142  K 4.4  CL 107  CO2 26*  BUN 33*  CREATININE 1.4*  CALCIUM 10.7   Liver Function Tests: Recent Labs    09/11/22 0000  AST 17  ALT 15  ALKPHOS 35  ALBUMIN 4.3   No results for input(s): "LIPASE", "AMYLASE" in the last 8760 hours. No results for input(s): "AMMONIA" in the last 8760 hours. CBC: Recent Labs    09/11/22 0000 09/24/22 0000  WBC 4.6 6.5  NEUTROABS 2,470.00 3,907.00  HGB 13.6 13.9  HCT 39* 40*  PLT 261 282   Lipid Panel: Recent Labs    09/11/22 0000  CHOL 212*  HDL 41  LDLCALC 140  TRIG 644*   Lab Results  Component Value Date   HGBA1C 7.6 09/11/2022    Procedures since last visit: No results  found.  Assessment/Plan 1. Type 2 diabetes mellitus with stage 3a chronic kidney disease, without long-term current use of insulin (HCC) AiC good for his age CBGS are mostly below 200  2. TIA (transient ischemic attack) H/o on Plavix  3. Hx of CABG No Coronary symptoms On Plavix and Zetia  4. Mixed hyperlipidemia Zetia and Fenofibrate Allergic to statin  5. Stage 3a chronic kidney disease (HCC) Creat stable  6. History of gout Colchicine and Amlodipine  7. Benign prostatic hyperplasia with urinary obstruction Proscar  8. Essential hypertension, benign Norvasc    Labs/tests ordered:   Next appt:  Visit date not found

## 2022-12-31 NOTE — Telephone Encounter (Signed)
Skilled resident at [facility], will send to facility providers to address and reply back to patients family member

## 2023-01-07 ENCOUNTER — Encounter: Payer: Self-pay | Admitting: Orthopedic Surgery

## 2023-01-07 ENCOUNTER — Non-Acute Institutional Stay (SKILLED_NURSING_FACILITY): Payer: Federal, State, Local not specified - PPO | Admitting: Orthopedic Surgery

## 2023-01-07 DIAGNOSIS — S51011A Laceration without foreign body of right elbow, initial encounter: Secondary | ICD-10-CM

## 2023-01-07 DIAGNOSIS — W19XXXA Unspecified fall, initial encounter: Secondary | ICD-10-CM

## 2023-01-07 DIAGNOSIS — M545 Low back pain, unspecified: Secondary | ICD-10-CM

## 2023-01-07 NOTE — Progress Notes (Signed)
Location:   [facility]  Nursing Home Room Number: 11-A Place of Service:  SNF (31) Provider:  [name], NP    Patient Care Team: [name], NP as PCP - General (Adult Health Nurse Practitioner) [name], MD as PCP - Cardiology (Cardiology)  Extended Emergency Contact Information Primary Emergency Contact: [name],[name]          [place], [place] Home Phone: [phone] Mobile Phone: [phone] Relation: Daughter Secondary Emergency Contact: [name],[name] Address: [address]          [place], [place] [address] [name] Home Phone: [phone] Mobile Phone: [phone] Relation: Daughter  Code Status: DNR  Goals of care: Advanced Directive information    01/07/2023    9:49 AM  Advanced Directives  Does Patient Have a Medical Advance Directive? Yes  Type of Estate agent of [place];Living will;Out of facility DNR (pink MOST or yellow form)  Does patient want to make changes to medical advance directive? No - Patient declined  Copy of Healthcare Power of Attorney in Chart? Yes - validated most recent copy scanned in chart (See row information)     Chief Complaint  Patient presents with   Acute Visit    Back pain post fall.    HPI:  Pt is a 87 y.o. male seen today for an acute visit due to low back pain.   He currently resides on the skilled nursing unit at [facility] PMH: CAD, s/p CABG 2000, HTN, TIA 2022, T2DM, HOH- cochlear implant, OA, BPH, CKD, memory deficit, shuffling gait, and weight loss.     07/21 he was found by nursing staff on the floor between his bed and wheelchair. He c/o low back pain and had wound to right elbow after event. Xray lumbar spine pending. He has tylenol 1000 mg scheduled twice daily. Today, he has a small bump to lower back. He c/o mild discomfort when moving. He denies pain at rest, radiation down legs or loss of B&B. He does have a h/o nocturnal  enuresis. Afebrile. Vitals stable.    Past Medical History:  Diagnosis Date   Cancer (HCC)    skin   CKD (chronic kidney disease) 06/24/2013   Cochlear implant in place    Coronary artery disease    a. s/p CABG 2000; b. myoview 9/08: inf-septal scar with mild peri-infarct ischemia (low risk);  c.  Echo 8/13: mild focal basal septal hypertrophy, Gr 1 DD, mild LAE;  d. Lexiscan Myoview (06/20/2013): No reversible ischemia, inferior and septal infarct, inferior and septal hypokinesis, EF 60%;  e.  Echo (06/20/2011): EF 50-55%, normal wall motion, grade 1 diastolic dysfunction, mild LAE   Diverticular disease    Duodenitis    Elevated PSA    GERD (gastroesophageal reflux disease)    Gout    Humerus fracture    right   Hydronephrosis with ureteropelvic junction obstruction    Hypercalcemia 02/02/2012   a. 01/2012: 10.7.   Hyperlipidemia    Hypertension    Pancreatitis    a. ? Simvastatin related   Pneumonia age 34 or 7   "I think only once" (06/19/2013)   Syncope    a. 06/2013   TIA (transient ischemic attack)    a. 01/2012 - presentation as acute imbalance;  b. 01/2012 carotid U/S w/o signif stenosis;  c. 01/2012 Echo: EF 60-65%, Gr 1 DD.;  d.  Carotid US (06/20/2013): 1-39% bilateral ICA stenosis   Tubular  adenoma polyp of rectum    Type II diabetes mellitus (HCC)    "diet and exercise controlled at present" (06/19/2013)   Past Surgical History:  Procedure Laterality Date   APPENDECTOMY     CARDIAC CATHETERIZATION  2000   CHOLECYSTECTOMY     COCHLEAR IMPLANT Right 1990's   COLONOSCOPY W/ POLYPECTOMY     no polyp 2012   CORONARY ARTERY BYPASS GRAFT  2000   CABG X5   INGUINAL HERNIA REPAIR Right 09/15/2015   Procedure: OPEN REPAIR RIGHT INGUINAL HERNIA ;  Surgeon: Avel Peace, MD;  Location: [facility];  Service: General;  Laterality: Right;   INSERTION OF MESH Right 09/15/2015   Procedure: INSERTION OF MESH;  Surgeon: Avel Peace, MD;  Location: [facility];  Service: General;  Laterality:  Right;   PARATHYROIDECTOMY  1978   POLYPECTOMY     REVERSE SHOULDER ARTHROPLASTY Right 12/30/2014   Procedure: Right shoulder ORIF, Biomet S3 Plate and ZImmer cable system ;  Surgeon: Beverely Low, MD;  Location: [facility] OR;  Service: Orthopedics;  Laterality: Right;   right ankle surgery  age 43   TONSILLECTOMY  52   trachestomy as child for throat closing      Allergies  Allergen Reactions   Simvastatin Other (See Comments)    Possible statin-induced pancreatitis    Allergies as of 01/07/2023       Reactions   Simvastatin Other (See Comments)   Possible statin-induced pancreatitis        Medication List        Accurate as of January 07, 2023  9:49 AM. If you have any questions, ask your nurse or doctor.          acetaminophen 500 MG tablet Commonly known as: TYLENOL Take 1,000 mg by mouth 2 (two) times daily.   allopurinol 100 MG tablet Commonly known as: ZYLOPRIM Take 100 mg by mouth daily.   amLODipine 5 MG tablet Commonly known as: NORVASC Take 1 tablet (5 mg total) by mouth daily.   clopidogrel 75 MG tablet Commonly known as: PLAVIX Take 75 mg by mouth daily.   colchicine 0.6 MG tablet Take 0.6 mg by mouth daily as needed (as directed for gout flares).   ezetimibe 10 MG tablet Commonly known as: ZETIA Take 10 mg by mouth daily.   Fenofibric Acid 135 MG Cpdr Take 1 tablet by mouth in the morning.   finasteride 5 MG tablet Commonly known as: PROSCAR Take 5 mg by mouth every evening.   ipratropium-albuterol 0.5-2.5 (3) MG/3ML Soln Commonly known as: DUONEB Take 3 mLs by nebulization every 6 (six) hours as needed.   metFORMIN 500 MG tablet Commonly known as: GLUCOPHAGE Take 1 tablet (500 mg total) by mouth daily with breakfast AND 0.5 tablets (250 mg total) daily with supper.   miconazole 2 % powder Commonly known as: MICOTIN Apply 1 Application topically every 8 (eight) hours as needed for itching.   zinc oxide 20 % ointment Apply 1 application   topically as needed for irritation. Apply transdermally as needed for skin irritation after each incontinent episode.        Review of Systems  Constitutional:  Negative for activity change.  HENT:  Positive for hearing loss.   Eyes:  Negative for visual disturbance.  Respiratory:  Negative for cough, shortness of breath and wheezing.   Cardiovascular:  Negative for chest pain and leg swelling.  Musculoskeletal:  Positive for back pain and gait problem.  Skin:  Positive for  wound.  Neurological:  Negative for numbness.  Psychiatric/Behavioral:  Negative for dysphoric mood. The patient is not nervous/anxious.     Immunization History  Administered Date(s) Administered   Fluad Quad(high Dose 65+) 04/01/2019, 04/11/2022   Influenza Split 04/13/2011, 05/02/2012   Influenza Whole 05/22/2007, 04/12/2008, 05/02/2009, 04/14/2010   Influenza, High Dose Seasonal PF 04/17/2013, 03/16/2014, 03/05/2016, 03/18/2017   Influenza,inj,Quad PF,6+ Mos 03/02/2015, 03/18/2018   Influenza-Unspecified 03/18/2018, 04/12/2021   Moderna Covid-19 Vaccine Bivalent Booster 23yrs & up 11/29/2021   Moderna SARS-COV2 Booster Vaccination 03/08/2021   Moderna Sars-Covid-2 Vaccination 06/22/2019, 07/20/2019, 05/02/2020, 11/23/2020   PPD Test 01/02/2015   Pneumococcal Conjugate-13 12/29/2015   Pneumococcal Polysaccharide-23 04/19/2006   Respiratory Syncytial Virus Vaccine,Recomb Aduvanted(Arexvy) 05/14/2022   Tdap 09/04/2016   Unspecified SARS-COV-2 Vaccination 04/24/2022   Zoster Recombinant(Shingrix) 09/17/2019, 03/29/2020   Zoster, Live 06/29/2015   Pertinent  Health Maintenance Due  Topic Date Due   INFLUENZA VACCINE  01/17/2023   HEMOGLOBIN A1C  03/14/2023   FOOT EXAM  10/29/2023   OPHTHALMOLOGY EXAM  12/03/2023      01/10/2022   10:24 AM 01/10/2022   12:47 PM 04/30/2022    2:09 PM 05/23/2022    9:44 AM 08/10/2022    3:12 PM  Fall Risk  Falls in the past year? 0 1 0 0 0  Was there an injury with  Fall? 0 0 0 0 0  Fall Risk Category Calculator 0 1 0 0 0  Fall Risk Category (Retired) Low Low Low Low   (RETIRED) Patient Fall Risk Level Low fall risk High fall risk Low fall risk Low fall risk   Patient at Risk for Falls Due to No Fall Risks History of fall(s);Impaired mobility No Fall Risks No Fall Risks History of fall(s);Impaired balance/gait;Impaired mobility  Fall risk Follow up Falls evaluation completed Falls evaluation completed;Education provided;Falls prevention discussed Falls evaluation completed Falls evaluation completed Falls evaluation completed;Education provided   Functional Status Survey:    Vitals:   01/07/23 0944  BP: 123/76  Pulse: 60  Resp: 20  Temp: (!) 96.1 F (35.6 C)  SpO2: 94%  Weight: 172 lb (78 kg)  Height: 5\' 9"  (1.753 m)   Body mass index is 25.4 kg/m. Physical Exam Vitals reviewed.  Constitutional:      General: He is not in acute distress. HENT:     Head: Normocephalic.  Eyes:     General:        Right eye: No discharge.        Left eye: No discharge.  Cardiovascular:     Rate and Rhythm: Normal rate and regular rhythm.     Pulses: Normal pulses.     Heart sounds: Normal heart sounds.  Pulmonary:     Effort: Pulmonary effort is normal. No respiratory distress.     Breath sounds: Normal breath sounds. No wheezing.  Musculoskeletal:        General: Tenderness present.     Right elbow: Laceration present. No swelling or deformity. Normal range of motion. No tenderness.     Cervical back: Normal and neck supple.     Thoracic back: Normal.     Lumbar back: Swelling present. No deformity or tenderness. Normal range of motion.     Comments: Approx 3-4 cm round area of swelling to lumbar spine, mild tenderness when palpated, no skin breakdown.   Skin:    General: Skin is warm.     Capillary Refill: Capillary refill takes less than 2 seconds.  Comments: Skin tear to right elbow reinforced by 2 steri strips, CDI, covered with nonadherent  dressing.   Neurological:     General: No focal deficit present.     Mental Status: He is alert and oriented to person, place, and time.     Motor: Weakness present.     Gait: Gait abnormal.  Psychiatric:        Mood and Affect: Mood normal.     Labs reviewed: Recent Labs    09/11/22 0000  NA 142  K 4.4  CL 107  CO2 26*  BUN 33*  CREATININE 1.4*  CALCIUM 10.7   Recent Labs    09/11/22 0000  AST 17  ALT 15  ALKPHOS 35  ALBUMIN 4.3   Recent Labs    09/11/22 0000 09/24/22 0000  WBC 4.6 6.5  NEUTROABS 2,470.00 3,907.00  HGB 13.6 13.9  HCT 39* 40*  PLT 261 282   Lab Results  Component Value Date   TSH 1.79 10/26/2021   Lab Results  Component Value Date   HGBA1C 7.6 09/11/2022   Lab Results  Component Value Date   CHOL 212 (A) 09/11/2022   HDL 41 09/11/2022   LDLCALC 140 09/11/2022   LDLDIRECT 96.0 08/23/2020   TRIG 174 (A) 09/11/2022   CHOLHDL 4.4 10/21/2020    Significant Diagnostic Results in last 30 days:  No results found.  Assessment/Plan 1. Acute midline low back pain without sciatica - 07/21 unwitnessed fall - c/o low back pain and had skin tear after event - mild pain with movement, small bump to lumbar spine> tender - xray lumbar spine> pending - cont scheduled tylenol   2. Fall, initial encounter - see above - initiate blue mats qhs  3. Skin tear of right elbow without complication, initial encounter - see above - cleanse with NS, cover with nonadherent dressing daily until healed    Family/ staff Communication: plan discussed with patient and nurse  Labs/tests ordered:  xray lumbar spine> pending

## 2023-01-22 ENCOUNTER — Non-Acute Institutional Stay (SKILLED_NURSING_FACILITY): Payer: Federal, State, Local not specified - PPO | Admitting: Adult Health

## 2023-01-22 ENCOUNTER — Encounter: Payer: Self-pay | Admitting: Adult Health

## 2023-01-22 DIAGNOSIS — E1122 Type 2 diabetes mellitus with diabetic chronic kidney disease: Secondary | ICD-10-CM

## 2023-01-22 DIAGNOSIS — N1831 Chronic kidney disease, stage 3a: Secondary | ICD-10-CM

## 2023-01-22 DIAGNOSIS — B372 Candidiasis of skin and nail: Secondary | ICD-10-CM | POA: Diagnosis not present

## 2023-01-22 NOTE — Progress Notes (Signed)
Location:  [facility] Room Number: 11-A Place of Service:  SNF (31) Provider:  Kenard Gower, DNP, FNP-BC  Patient Care Team: [name], NP as PCP - General (Adult Health Nurse Practitioner) [name], MD as PCP - Cardiology (Cardiology)  Extended Emergency Contact Information Primary Emergency Contact: [name],[name]          [place], [place] Home Phone: [phone] Mobile Phone: [phone] Relation: Daughter Secondary Emergency Contact: [name],[name] Address: [address]          [place], [place] [address] [name] Home Phone: [phone] Mobile Phone: [phone] Relation: Daughter  Code Status:  DNR  Goals of care: Advanced Directive information    01/22/2023   11:33 AM  Advanced Directives  Does Patient Have a Medical Advance Directive? Yes  Type of Estate agent of [place];Living will;Out of facility DNR (pink MOST or yellow form)  Does patient want to make changes to medical advance directive? No - Patient declined  Copy of Healthcare Power of Attorney in Chart? Yes - validated most recent copy scanned in chart (See row information)     Chief Complaint  Patient presents with   Acute Visit    rashes on groin    HPI:  Pt is a 87 y.o. male seen today for acute visit regarding rashes on groin. He is a long-term care resident of [facility]. He was noted to have erythematous rashes on his bilateral groin. He is occasionally incontinent of urine. He takes Metformin 500 mg in the morning and 250 mg in the evening for diabetes mellitus. A1C 7.7, 12/07/22.    Past Medical History:  Diagnosis Date   Cancer (HCC)    skin   CKD (chronic kidney disease) 06/24/2013   Cochlear implant in place    Coronary artery disease    a. s/p CABG 2000; b. myoview 9/08: inf-septal scar with mild peri-infarct ischemia (low risk);  c.  Echo 8/13: mild focal basal septal hypertrophy,  Gr 1 DD, mild LAE;  d. Lexiscan Myoview (06/20/2013): No reversible ischemia, inferior and septal infarct, inferior and septal hypokinesis, EF 60%;  e.  Echo (06/20/2011): EF 50-55%, normal wall motion, grade 1 diastolic dysfunction, mild LAE   Diverticular disease    Duodenitis    Elevated PSA    GERD (gastroesophageal reflux disease)    Gout    Humerus fracture    right   Hydronephrosis with ureteropelvic junction obstruction    Hypercalcemia 02/02/2012   a. 01/2012: 10.7.   Hyperlipidemia    Hypertension    Pancreatitis    a. ? Simvastatin related   Pneumonia age 7 or 7   "I think only once" (06/19/2013)   Syncope    a. 06/2013   TIA (transient ischemic attack)    a. 01/2012 - presentation as acute imbalance;  b. 01/2012 carotid U/S w/o signif stenosis;  c. 01/2012 Echo: EF 60-65%, Gr 1 DD.;  d.  Carotid US (06/20/2013): 1-39% bilateral ICA stenosis   Tubular adenoma polyp of rectum    Type II diabetes mellitus (HCC)    "diet and exercise controlled at present" (06/19/2013)   Past Surgical History:  Procedure Laterality Date   APPENDECTOMY     CARDIAC CATHETERIZATION  2000   CHOLECYSTECTOMY     COCHLEAR IMPLANT Right 1990's   COLONOSCOPY W/ POLYPECTOMY     no polyp 2012   CORONARY ARTERY BYPASS GRAFT  2000  CABG X5   INGUINAL HERNIA REPAIR Right 09/15/2015   Procedure: OPEN REPAIR RIGHT INGUINAL HERNIA ;  Surgeon: Avel Peace, MD;  Location: [facility];  Service: General;  Laterality: Right;   INSERTION OF MESH Right 09/15/2015   Procedure: INSERTION OF MESH;  Surgeon: Avel Peace, MD;  Location: [facility];  Service: General;  Laterality: Right;   PARATHYROIDECTOMY  1978   POLYPECTOMY     REVERSE SHOULDER ARTHROPLASTY Right 12/30/2014   Procedure: Right shoulder ORIF, Biomet S3 Plate and ZImmer cable system ;  Surgeon: Beverely Low, MD;  Location: [facility] OR;  Service: Orthopedics;  Laterality: Right;   right ankle surgery  age 54   TONSILLECTOMY  44   trachestomy as child for throat  closing      Allergies  Allergen Reactions   Simvastatin Other (See Comments)    Possible statin-induced pancreatitis    Outpatient Encounter Medications as of 01/22/2023  Medication Sig   acetaminophen (TYLENOL) 500 MG tablet Take 1,000 mg by mouth 2 (two) times daily.   allopurinol (ZYLOPRIM) 100 MG tablet Take 100 mg by mouth daily.   amLODipine (NORVASC) 5 MG tablet Take 1 tablet (5 mg total) by mouth daily.   Choline Fenofibrate (FENOFIBRIC ACID) 135 MG CPDR Take 1 tablet by mouth in the morning.   clopidogrel (PLAVIX) 75 MG tablet Take 75 mg by mouth daily.   colchicine 0.6 MG tablet Take 0.6 mg by mouth daily as needed (as directed for gout flares).   ezetimibe (ZETIA) 10 MG tablet Take 10 mg by mouth daily.   finasteride (PROSCAR) 5 MG tablet Take 5 mg by mouth every evening.   ipratropium-albuterol (DUONEB) 0.5-2.5 (3) MG/3ML SOLN Take 3 mLs by nebulization every 6 (six) hours as needed.   metFORMIN (GLUCOPHAGE) 500 MG tablet Take 1 tablet (500 mg total) by mouth daily with breakfast AND 0.5 tablets (250 mg total) daily with supper.   miconazole (MICOTIN) 2 % powder Apply 1 Application topically every 8 (eight) hours as needed for itching.   zinc oxide 20 % ointment Apply 1 application  topically as needed for irritation. Apply transdermally as needed for skin irritation after each incontinent episode.   No facility-administered encounter medications on file as of 01/22/2023.    Review of Systems  Constitutional:  Negative for activity change, appetite change and fever.  HENT:  Positive for hearing loss. Negative for sore throat.   Eyes: Negative.   Cardiovascular:  Negative for chest pain and leg swelling.  Gastrointestinal:  Negative for abdominal distention, diarrhea and vomiting.  Genitourinary:  Negative for dysuria, frequency and urgency.  Skin:  Positive for rash. Negative for color change.  Neurological:  Negative for dizziness and headaches.  Psychiatric/Behavioral:   Negative for behavioral problems and sleep disturbance. The patient is not nervous/anxious.        Immunization History  Administered Date(s) Administered   Fluad Quad(high Dose 65+) 04/01/2019, 04/11/2022   Influenza Split 04/13/2011, 05/02/2012   Influenza Whole 05/22/2007, 04/12/2008, 05/02/2009, 04/14/2010   Influenza, High Dose Seasonal PF 04/17/2013, 03/16/2014, 03/05/2016, 03/18/2017   Influenza,inj,Quad PF,6+ Mos 03/02/2015, 03/18/2018   Influenza-Unspecified 03/18/2018, 04/12/2021   Moderna Covid-19 Vaccine Bivalent Booster 45yrs & up 11/29/2021   Moderna SARS-COV2 Booster Vaccination 03/08/2021   Moderna Sars-Covid-2 Vaccination 06/22/2019, 07/20/2019, 05/02/2020, 11/23/2020   PPD Test 01/02/2015   Pneumococcal Conjugate-13 12/29/2015   Pneumococcal Polysaccharide-23 04/19/2006   Respiratory Syncytial Virus Vaccine,Recomb Aduvanted(Arexvy) 05/14/2022   Tdap 09/04/2016   Unspecified SARS-COV-2 Vaccination 04/24/2022  Zoster Recombinant(Shingrix) 09/17/2019, 03/29/2020   Zoster, Live 06/29/2015   Pertinent  Health Maintenance Due  Topic Date Due   INFLUENZA VACCINE  01/17/2023   HEMOGLOBIN A1C  03/14/2023   FOOT EXAM  10/29/2023   OPHTHALMOLOGY EXAM  12/03/2023      01/10/2022   10:24 AM 01/10/2022   12:47 PM 04/30/2022    2:09 PM 05/23/2022    9:44 AM 08/10/2022    3:12 PM  Fall Risk  Falls in the past year? 0 1 0 0 0  Was there an injury with Fall? 0 0 0 0 0  Fall Risk Category Calculator 0 1 0 0 0  Fall Risk Category (Retired) Low Low Low Low   (RETIRED) Patient Fall Risk Level Low fall risk High fall risk Low fall risk Low fall risk   Patient at Risk for Falls Due to No Fall Risks History of fall(s);Impaired mobility No Fall Risks No Fall Risks History of fall(s);Impaired balance/gait;Impaired mobility  Fall risk Follow up Falls evaluation completed Falls evaluation completed;Education provided;Falls prevention discussed Falls evaluation completed Falls  evaluation completed Falls evaluation completed;Education provided     Vitals:   01/22/23 1139  BP: 135/82  Pulse: 80  Resp: (!) 22  Temp: 98.1 F (36.7 C)  SpO2: 93%  Weight: 172 lb 6.4 oz (78.2 kg)  Height: 5\' 9"  (1.753 m)   Body mass index is 25.46 kg/m.  Physical Exam Constitutional:      General: He is not in acute distress.    Appearance: Normal appearance.  HENT:     Head: Normocephalic and atraumatic.     Ears:     Comments: Has right cochlear implant    Mouth/Throat:     Mouth: Mucous membranes are moist.  Eyes:     Conjunctiva/sclera: Conjunctivae normal.  Cardiovascular:     Rate and Rhythm: Normal rate and regular rhythm.     Pulses: Normal pulses.     Heart sounds: Normal heart sounds.  Pulmonary:     Effort: Pulmonary effort is normal.     Breath sounds: Normal breath sounds.  Abdominal:     General: Bowel sounds are normal.     Palpations: Abdomen is soft.  Musculoskeletal:        General: No swelling. Normal range of motion.     Cervical back: Normal range of motion.  Skin:    General: Skin is warm and dry.     Findings: Rash present.     Comments: Bilateral groin with erythematous rashes  Neurological:     General: No focal deficit present.     Mental Status: He is alert and oriented to person, place, and time.  Psychiatric:        Mood and Affect: Mood normal.        Behavior: Behavior normal.        Thought Content: Thought content normal.        Judgment: Judgment normal.      Labs reviewed: Recent Labs    09/11/22 0000  NA 142  K 4.4  CL 107  CO2 26*  BUN 33*  CREATININE 1.4*  CALCIUM 10.7   Recent Labs    09/11/22 0000  AST 17  ALT 15  ALKPHOS 35  ALBUMIN 4.3   Recent Labs    09/11/22 0000 09/24/22 0000  WBC 4.6 6.5  NEUTROABS 2,470.00 3,907.00  HGB 13.6 13.9  HCT 39* 40*  PLT 261 282   Lab Results  Component  Value Date   TSH 1.79 10/26/2021   Lab Results  Component Value Date   HGBA1C 7.6 09/11/2022    Lab Results  Component Value Date   CHOL 212 (A) 09/11/2022   HDL 41 09/11/2022   LDLCALC 140 09/11/2022   LDLDIRECT 96.0 08/23/2020   TRIG 174 (A) 09/11/2022   CHOLHDL 4.4 10/21/2020    Significant Diagnostic Results in last 30 days:  No results found.  Assessment/Plan  1. Candidal skin infection -  will start on Nystatin ointment 100,000 units/gm apply to rashes on bilateral groin BID -  keep area clean and dry -   instructed resident to ask for assistance to change every episode of incontinence  2. Type 2 diabetes mellitus with stage 3a chronic kidney disease, without long-term current use of insulin (HCC) Lab Results  Component Value Date   HGBA1C 7.7 12/07/2022   -  stable -  continue Metformin    Family/ staff Communication: Discussed plan of care with resident and charge nurse.  Labs/tests ordered: None    Kenard Gower, DNP, MSN, FNP-BC [facility] [phone] (Monday-Friday 8:00 a.m. - 5:00 p.m.) [phone] (after hours)

## 2023-01-25 ENCOUNTER — Encounter: Payer: Self-pay | Admitting: Orthopedic Surgery

## 2023-01-25 ENCOUNTER — Non-Acute Institutional Stay: Payer: Self-pay | Admitting: Orthopedic Surgery

## 2023-01-25 DIAGNOSIS — N138 Other obstructive and reflux uropathy: Secondary | ICD-10-CM

## 2023-01-25 DIAGNOSIS — H905 Unspecified sensorineural hearing loss: Secondary | ICD-10-CM

## 2023-01-25 DIAGNOSIS — H6123 Impacted cerumen, bilateral: Secondary | ICD-10-CM

## 2023-01-25 DIAGNOSIS — B372 Candidiasis of skin and nail: Secondary | ICD-10-CM

## 2023-01-25 DIAGNOSIS — E0865 Diabetes mellitus due to underlying condition with hyperglycemia: Secondary | ICD-10-CM

## 2023-01-25 DIAGNOSIS — N3944 Nocturnal enuresis: Secondary | ICD-10-CM

## 2023-01-25 DIAGNOSIS — N401 Enlarged prostate with lower urinary tract symptoms: Secondary | ICD-10-CM

## 2023-01-25 DIAGNOSIS — I10 Essential (primary) hypertension: Secondary | ICD-10-CM

## 2023-01-25 DIAGNOSIS — N1831 Chronic kidney disease, stage 3a: Secondary | ICD-10-CM

## 2023-01-25 DIAGNOSIS — I2510 Atherosclerotic heart disease of native coronary artery without angina pectoris: Secondary | ICD-10-CM

## 2023-01-25 DIAGNOSIS — R413 Other amnesia: Secondary | ICD-10-CM

## 2023-01-25 DIAGNOSIS — G459 Transient cerebral ischemic attack, unspecified: Secondary | ICD-10-CM

## 2023-01-25 NOTE — Progress Notes (Signed)
Location:   [facility]  Nursing Home Room Number: 11-A Place of Service:  SNF (31) Provider:  [name], NP    Patient Care Team: [name], NP as PCP - General (Adult Health Nurse Practitioner) [name], MD as PCP - Cardiology (Cardiology)  Extended Emergency Contact Information Primary Emergency Contact: [name],[name]          [place], [place] Home Phone: [phone] Mobile Phone: [phone] Relation: Daughter Secondary Emergency Contact: [name],[name] Address: [address]          [place], [place] [address] [name] Home Phone: [phone] Mobile Phone: [phone] Relation: Daughter  Code Status:  DNR Goals of care: Advanced Directive information    01/25/2023    9:02 AM  Advanced Directives  Does Patient Have a Medical Advance Directive? Yes  Type of Estate agent of [place];Living will;Out of facility DNR (pink MOST or yellow form)  Does patient want to make changes to medical advance directive? No - Patient declined  Copy of Healthcare Power of Attorney in Chart? Yes - validated most recent copy scanned in chart (See row information)     Chief Complaint  Patient presents with   Medical Management of Chronic Issues    Routine Visit.    Immunizations    Discuss the need for Influenza vaccine.     HPI:  Pt is a 87 y.o. male seen today for medical management of chronic diseases.     He currently resides on the skilled nursing unit at [facility]. PMH: CAD, s/p CABG 2000, HTN, TIA 2022, T2DM, HOH- cochlear implant, OA, BPH, CKD, memory deficit, shuffling gait, and weight loss.     Candidal skin infection- 08/06 nystatin cream started BID to groin folds T2DM- A1c 7.7 (06/21)> was  7.6 (03/26), blood sugars 150-180's, no hypoglycemias, urine microalbumin 31 04/27/2022, eye exam 12/03/2022, regular diet, remains on metformin Nocturnal enuresis- past urine culture negative,  improved with scheduled toileting at midnight and 4 am HxTIA- no further workup per neurology, remains on plavix and fenofibrate> allergy to statin CAD- H/o CABG, followed by cardiology, 10/2020 LVEF 60-65%, remains on plavix HTN- BUN/creat 33/1.4 09/11/2022, remains on amlodipine CKD- see above Memory deficit- BIMS score 15/15 (08/05)> was 15/15 (05/03), CT head 12/2020 atrophy and chronic microvascular ischemic changes noted, no behaviors BPH- remains on finasteride Sensory hearing loss- followed by Dr. Manson Passey, right cochlear implant in place   07/21 fall in room, developed right elbow skin tear and had increased back pain after event. Lumbar xray with no acute fracture, degenerative disc disease present. Denies back pain today.   Recent weights:  08/01- 173.4 lbs  07/01- 172.9 lbs  06/01- 175 lbs  Recent blood pressures:  08/06- 146/87  07/30- 135/82  07/24- 128/72    Past Medical History:  Diagnosis Date   Cancer (HCC)    skin   CKD (chronic kidney disease) 06/24/2013   Cochlear implant in place    Coronary artery disease    a. s/p CABG 2000; b. myoview 9/08: inf-septal scar with mild peri-infarct ischemia (low risk);  c.  Echo 8/13: mild focal basal septal hypertrophy, Gr 1 DD, mild LAE;  d. Lexiscan Myoview (06/20/2013): No reversible ischemia, inferior and septal infarct, inferior and septal hypokinesis, EF 60%;  e.  Echo (06/20/2011): EF 50-55%, normal wall motion, grade 1 diastolic dysfunction, mild LAE   Diverticular disease    Duodenitis    Elevated PSA  GERD (gastroesophageal reflux disease)    Gout    Humerus fracture    right   Hydronephrosis with ureteropelvic junction obstruction    Hypercalcemia 02/02/2012   a. 01/2012: 10.7.   Hyperlipidemia    Hypertension    Pancreatitis    a. ? Simvastatin related   Pneumonia age 42 or 7   "I think only once" (06/19/2013)   Syncope    a. 06/2013   TIA (transient ischemic attack)    a. 01/2012 - presentation as acute  imbalance;  b. 01/2012 carotid U/S w/o signif stenosis;  c. 01/2012 Echo: EF 60-65%, Gr 1 DD.;  d.  Carotid US (06/20/2013): 1-39% bilateral ICA stenosis   Tubular adenoma polyp of rectum    Type II diabetes mellitus (HCC)    "diet and exercise controlled at present" (06/19/2013)   Past Surgical History:  Procedure Laterality Date   APPENDECTOMY     CARDIAC CATHETERIZATION  2000   CHOLECYSTECTOMY     COCHLEAR IMPLANT Right 1990's   COLONOSCOPY W/ POLYPECTOMY     no polyp 2012   CORONARY ARTERY BYPASS GRAFT  2000   CABG X5   INGUINAL HERNIA REPAIR Right 09/15/2015   Procedure: OPEN REPAIR RIGHT INGUINAL HERNIA ;  Surgeon: Avel Peace, MD;  Location: [facility] ORS;  Service: General;  Laterality: Right;   INSERTION OF MESH Right 09/15/2015   Procedure: INSERTION OF MESH;  Surgeon: Avel Peace, MD;  Location: [facility] OR[facility];  Service: General;  Laterality: Right;   PARATHYROIDECTOMY  1978   POLYPECTOMY     REVERSE SHOULDER ARTHROPLASTY Right 12/30/2014   Procedure: Right shoulder ORIF, Biomet S3 Plate and ZImmer cable system ;  Surgeon: Beverely Low, MD;  Location: [facility] OR;  Service: Orthopedics;  Laterality: Right;   right ankle surgery  age 26   TONSILLECTOMY  41   trachestomy as child for throat closing      Allergies  Allergen Reactions   Simvastatin Other (See Comments)    Possible statin-induced pancreatitis    Allergies as of 01/25/2023       Reactions   Simvastatin Other (See Comments)   Possible statin-induced pancreatitis        Medication List        Accurate as of January 25, 2023  9:02 AM. If you have any questions, ask your nurse or doctor.          acetaminophen 500 MG tablet Commonly known as: TYLENOL Take 1,000 mg by mouth 2 (two) times daily.   allopurinol 100 MG tablet Commonly known as: ZYLOPRIM Take 100 mg by mouth daily.   amLODipine 5 MG tablet Commonly known as: NORVASC Take 1 tablet (5 mg total) by mouth daily.   clopidogrel 75 MG tablet Commonly  known as: PLAVIX Take 75 mg by mouth daily.   colchicine 0.6 MG tablet Take 0.6 mg by mouth daily as needed (as directed for gout flares).   ezetimibe 10 MG tablet Commonly known as: ZETIA Take 10 mg by mouth daily.   Fenofibric Acid 135 MG Cpdr Take 1 tablet by mouth in the morning.   finasteride 5 MG tablet Commonly known as: PROSCAR Take 5 mg by mouth every evening.   ipratropium-albuterol 0.5-2.5 (3) MG/3ML Soln Commonly known as: DUONEB Take 3 mLs by nebulization every 6 (six) hours as needed.   metFORMIN 500 MG tablet Commonly known as: GLUCOPHAGE Take 500 mg by mouth every morning.   metFORMIN 500 MG tablet Commonly known as: GLUCOPHAGE Take  250 mg by mouth every evening.   miconazole 2 % powder Commonly known as: MICOTIN Apply 1 Application topically every 8 (eight) hours as needed for itching.   nystatin cream Commonly known as: MYCOSTATIN Apply 1 Application topically in the morning and at bedtime. Apply to bilateral groin   zinc oxide 20 % ointment Apply 1 application  topically as needed for irritation. Apply transdermally as needed for skin irritation after each incontinent episode.        Review of Systems  Constitutional:  Negative for activity change and appetite change.  HENT:  Positive for hearing loss. Negative for trouble swallowing.   Eyes:  Negative for visual disturbance.  Respiratory:  Negative for cough, shortness of breath and wheezing.   Cardiovascular:  Negative for chest pain and leg swelling.  Gastrointestinal:  Negative for abdominal distention and abdominal pain.  Genitourinary:  Negative for dysuria, frequency and hematuria.       Nocturia  Musculoskeletal:  Positive for back pain and gait problem.  Skin:  Negative for wound.  Neurological:  Positive for weakness. Negative for dizziness and headaches.  Psychiatric/Behavioral:  Negative for confusion, dysphoric mood and sleep disturbance. The patient is not nervous/anxious.      Immunization History  Administered Date(s) Administered   Fluad Quad(high Dose 65+) 04/01/2019, 04/11/2022   Influenza Split 04/13/2011, 05/02/2012   Influenza Whole 05/22/2007, 04/12/2008, 05/02/2009, 04/14/2010   Influenza, High Dose Seasonal PF 04/17/2013, 03/16/2014, 03/05/2016, 03/18/2017   Influenza,inj,Quad PF,6+ Mos 03/02/2015, 03/18/2018   Influenza-Unspecified 03/18/2018, 04/12/2021   Moderna Covid-19 Vaccine Bivalent Booster 43yrs & up 11/29/2021   Moderna SARS-COV2 Booster Vaccination 03/08/2021   Moderna Sars-Covid-2 Vaccination 06/22/2019, 07/20/2019, 05/02/2020, 11/23/2020   PPD Test 01/02/2015   Pneumococcal Conjugate-13 12/29/2015   Pneumococcal Polysaccharide-23 04/19/2006   Respiratory Syncytial Virus Vaccine,Recomb Aduvanted(Arexvy) 05/14/2022   Tdap 09/04/2016   Unspecified SARS-COV-2 Vaccination 04/24/2022   Zoster Recombinant(Shingrix) 09/17/2019, 03/29/2020   Zoster, Live 06/29/2015   Pertinent  Health Maintenance Due  Topic Date Due   INFLUENZA VACCINE  01/17/2023   HEMOGLOBIN A1C  06/08/2023   FOOT EXAM  10/29/2023   OPHTHALMOLOGY EXAM  12/03/2023      01/10/2022   10:24 AM 01/10/2022   12:47 PM 04/30/2022    2:09 PM 05/23/2022    9:44 AM 08/10/2022    3:12 PM  Fall Risk  Falls in the past year? 0 1 0 0 0  Was there an injury with Fall? 0 0 0 0 0  Fall Risk Category Calculator 0 1 0 0 0  Fall Risk Category (Retired) Low Low Low Low   (RETIRED) Patient Fall Risk Level Low fall risk High fall risk Low fall risk Low fall risk   Patient at Risk for Falls Due to No Fall Risks History of fall(s);Impaired mobility No Fall Risks No Fall Risks History of fall(s);Impaired balance/gait;Impaired mobility  Fall risk Follow up Falls evaluation completed Falls evaluation completed;Education provided;Falls prevention discussed Falls evaluation completed Falls evaluation completed Falls evaluation completed;Education provided   Functional Status Survey:     Vitals:   01/25/23 0850  BP: (!) 146/87  Pulse: 85  Resp: 20  Temp: 97.7 F (36.5 C)  SpO2: 93%  Weight: 169 lb 12.8 oz (77 kg)  Height: 5\' 9"  (1.753 m)   Body mass index is 25.08 kg/m. Physical Exam Vitals reviewed.  Constitutional:      General: He is not in acute distress. HENT:     Head: Normocephalic.  Right Ear: There is impacted cerumen.     Left Ear: There is impacted cerumen.     Nose: Nose normal.     Mouth/Throat:     Mouth: Mucous membranes are moist.  Eyes:     General:        Right eye: No discharge.        Left eye: No discharge.  Cardiovascular:     Rate and Rhythm: Normal rate and regular rhythm.     Pulses:          Dorsalis pedis pulses are 1+ on the right side and 1+ on the left side.       Posterior tibial pulses are 1+ on the right side and 1+ on the left side.     Heart sounds: Normal heart sounds.  Pulmonary:     Effort: Pulmonary effort is normal. No respiratory distress.     Breath sounds: Normal breath sounds. No wheezing.  Abdominal:     General: Bowel sounds are normal.     Palpations: Abdomen is soft.  Musculoskeletal:     Cervical back: Neck supple.     Right lower leg: No edema.     Left lower leg: No edema.  Feet:     Right foot:     Skin integrity: Skin integrity normal.     Toenail Condition: Right toenails are abnormally thick. Fungal disease present.    Left foot:     Skin integrity: Skin integrity normal.     Toenail Condition: Left toenails are abnormally thick. Fungal disease present. Skin:    General: Skin is warm.     Capillary Refill: Capillary refill takes less than 2 seconds.     Comments: Excoriated skin to groin folds, senile purpura to upper extremities, vary in size, right elbow skin tear, scab intact, almost healed.   Neurological:     General: No focal deficit present.     Mental Status: He is alert and oriented to person, place, and time.     Motor: Weakness present.     Gait: Gait abnormal.      Comments: wheelchair  Psychiatric:        Mood and Affect: Mood normal.     Labs reviewed: Recent Labs    09/11/22 0000  NA 142  K 4.4  CL 107  CO2 26*  BUN 33*  CREATININE 1.4*  CALCIUM 10.7   Recent Labs    09/11/22 0000  AST 17  ALT 15  ALKPHOS 35  ALBUMIN 4.3   Recent Labs    09/11/22 0000 09/24/22 0000  WBC 4.6 6.5  NEUTROABS 2,470.00 3,907.00  HGB 13.6 13.9  HCT 39* 40*  PLT 261 282   Lab Results  Component Value Date   TSH 2.15 12/07/2022   Lab Results  Component Value Date   HGBA1C 7.7 12/07/2022   Lab Results  Component Value Date   CHOL 212 (A) 09/11/2022   HDL 41 09/11/2022   LDLCALC 140 09/11/2022   LDLDIRECT 96.0 08/23/2020   TRIG 174 (A) 09/11/2022   CHOLHDL 4.4 10/21/2020    Significant Diagnostic Results in last 30 days:  No results found.  Assessment/Plan 1. Bilateral impacted cerumen - start Debrox- 5 gtts to both ears at bedtime x 5 days - flush ars when debrox complete  2. Candidal skin infection - 08/06 nystatin started BID - groin folds excoriated - suspect related to T2DM> recheck A1c next month  3. Diabetes mellitus due to  underlying condition with hyperglycemia, without long-term current use of insulin (HCC) - A1c 7.7> was 6.4 (12/2021) - no hypoglycemia - 06/17> eye exam per nursing notes> [facility] in [facility] - cont metformin - check A1c next month  4. Nocturnal enuresis - scheduled toileting 12 am and 4 am   5. TIA (transient ischemic attack) - cont Plavix and Zetia  6. Atherosclerosis of native coronary artery of native heart without angina pectoris - h/o CABG - followed by cardiology - cont plavix and Zetia - statin allergy  7. Essential hypertension, benign - controlled - cont amlodipine   8. Stage 3a chronic kidney disease (HCC) - encourage hydration with water - avoid NSAIDS  9. Memory deficit - no behaviors - doing well in SNF - recent BIMS 15/15  10. Benign prostatic hyperplasia with  urinary obstruction - cont finasteride  11. Sensory hearing loss - right cochlear implant  - followed by Dr. Manson Passey    Family/ staff Communication: plan discussed with patient and nurse  Labs/tests ordered:  A1c next month, obtain last eye exam note

## 2023-02-01 ENCOUNTER — Encounter: Payer: Self-pay | Admitting: Orthopedic Surgery

## 2023-02-01 ENCOUNTER — Non-Acute Institutional Stay: Payer: Self-pay | Admitting: Orthopedic Surgery

## 2023-02-01 DIAGNOSIS — R296 Repeated falls: Secondary | ICD-10-CM

## 2023-02-01 DIAGNOSIS — N3944 Nocturnal enuresis: Secondary | ICD-10-CM

## 2023-02-01 DIAGNOSIS — M25531 Pain in right wrist: Secondary | ICD-10-CM | POA: Diagnosis not present

## 2023-02-01 DIAGNOSIS — H6123 Impacted cerumen, bilateral: Secondary | ICD-10-CM

## 2023-02-01 NOTE — Progress Notes (Unsigned)
Location:  [facility] Room Number: 11/A Place of Service:  SNF (31) Provider:  [name], NP   [name], NP  Patient Care Team: [name], NP as PCP - General (Adult Health Nurse Practitioner) [name], MD as PCP - Cardiology (Cardiology)  Extended Emergency Contact Information Primary Emergency Contact: [name],[name]          [place], [place] Home Phone: [phone] Mobile Phone: [phone] Relation: Daughter Secondary Emergency Contact: [name],[name] Address: [address]          [place], [place] [address] [name] Home Phone: [phone] Mobile Phone: [phone] Relation: Daughter  Code Status:  DNR Goals of care: Advanced Directive information    01/25/2023    9:02 AM  Advanced Directives  Does Patient Have a Medical Advance Directive? Yes  Type of Estate agent of [place];Living will;Out of facility DNR (pink MOST or yellow form)  Does patient want to make changes to medical advance directive? No - Patient declined  Copy of Healthcare Power of Attorney in Chart? Yes - validated most recent copy scanned in chart (See row information)     Chief Complaint  Patient presents with   Acute Visit    Fall, right arm pain    HPI:  Pt is a 87 y.o. male seen today for acute visit due to right arm pain.   He currently resides on the skilled nursing unit at [facility]. PMH: CAD, s/p CABG 2000, HTN, TIA 2022, T2DM, HOH- cochlear implant, OA, BPH, CKD, memory deficit, shuffling gait, and weight loss.   Daughter, Lupita Leash present during encounter.   08/15 he fell in his room. No apparent injuries at that time. He denies hitting his head.This morning, right wrist was bruised and painful per nursing. He has had frequent falls within past 2 months. H/o nocturnal enuresis. He will attempt to get up on his own and not use call bell. Nursing recently started placing blue mats to  floor at bedtime. He does not like blue mats in room, reports it is hard to move wheelchair over them. Staff and family have explained he needs to use call bell when he wants to get up. Nursing staff is also waking him at 12 am and 4 am to use bathroom.   He denies pain to right arm/wrist/body. He is able to move extremities without difficulty. Afebrile. Vitals stable.      Past Medical History:  Diagnosis Date   Cancer (HCC)    skin   CKD (chronic kidney disease) 06/24/2013   Cochlear implant in place    Coronary artery disease    a. s/p CABG 2000; b. myoview 9/08: inf-septal scar with mild peri-infarct ischemia (low risk);  c.  Echo 8/13: mild focal basal septal hypertrophy, Gr 1 DD, mild LAE;  d. Lexiscan Myoview (06/20/2013): No reversible ischemia, inferior and septal infarct, inferior and septal hypokinesis, EF 60%;  e.  Echo (06/20/2011): EF 50-55%, normal wall motion, grade 1 diastolic dysfunction, mild LAE   Diverticular disease    Duodenitis    Elevated PSA    GERD (gastroesophageal reflux disease)    Gout    Humerus fracture    right   Hydronephrosis with ureteropelvic junction obstruction    Hypercalcemia 02/02/2012   a. 01/2012: 10.7.   Hyperlipidemia    Hypertension    Pancreatitis    a. ? Simvastatin related   Pneumonia age 73 or  7   "I think only once" (06/19/2013)   Syncope    a. 06/2013   TIA (transient ischemic attack)    a. 01/2012 - presentation as acute imbalance;  b. 01/2012 carotid U/S w/o signif stenosis;  c. 01/2012 Echo: EF 60-65%, Gr 1 DD.;  d.  Carotid US (06/20/2013): 1-39% bilateral ICA stenosis   Tubular adenoma polyp of rectum    Type II diabetes mellitus (HCC)    "diet and exercise controlled at present" (06/19/2013)   Past Surgical History:  Procedure Laterality Date   APPENDECTOMY     CARDIAC CATHETERIZATION  2000   CHOLECYSTECTOMY     COCHLEAR IMPLANT Right 1990's   COLONOSCOPY W/ POLYPECTOMY     no polyp 2012   CORONARY ARTERY BYPASS GRAFT  2000    CABG X5   INGUINAL HERNIA REPAIR Right 09/15/2015   Procedure: OPEN REPAIR RIGHT INGUINAL HERNIA ;  Surgeon: Avel Peace, MD;  Location: [facility] ORS;  Service: General;  Laterality: Right;   INSERTION OF MESH Right 09/15/2015   Procedure: INSERTION OF MESH;  Surgeon: Avel Peace, MD;  Location: [facility] ORS;  Service: General;  Laterality: Right;   PARATHYROIDECTOMY  1978   POLYPECTOMY     REVERSE SHOULDER ARTHROPLASTY Right 12/30/2014   Procedure: Right shoulder ORIF, Biomet S3 Plate and ZImmer cable system ;  Surgeon: Beverely Low, MD;  Location: [facility] OR;  Service: Orthopedics;  Laterality: Right;   right ankle surgery  age 28   TONSILLECTOMY  69   trachestomy as child for throat closing      Allergies  Allergen Reactions   Simvastatin Other (See Comments)    Possible statin-induced pancreatitis    Outpatient Encounter Medications as of 02/01/2023  Medication Sig   acetaminophen (TYLENOL) 500 MG tablet Take 1,000 mg by mouth 2 (two) times daily.   allopurinol (ZYLOPRIM) 100 MG tablet Take 100 mg by mouth daily.   amLODipine (NORVASC) 5 MG tablet Take 1 tablet (5 mg total) by mouth daily.   Choline Fenofibrate (FENOFIBRIC ACID) 135 MG CPDR Take 1 tablet by mouth in the morning.   clopidogrel (PLAVIX) 75 MG tablet Take 75 mg by mouth daily.   colchicine 0.6 MG tablet Take 0.6 mg by mouth daily as needed (as directed for gout flares).   ezetimibe (ZETIA) 10 MG tablet Take 10 mg by mouth daily.   finasteride (PROSCAR) 5 MG tablet Take 5 mg by mouth every evening.   ipratropium-albuterol (DUONEB) 0.5-2.5 (3) MG/3ML SOLN Take 3 mLs by nebulization every 6 (six) hours as needed.   metFORMIN (GLUCOPHAGE) 500 MG tablet Take 500 mg by mouth every morning.   metFORMIN (GLUCOPHAGE) 500 MG tablet Take 250 mg by mouth every evening.   miconazole (MICOTIN) 2 % powder Apply 1 Application topically every 8 (eight) hours as needed for itching.   nystatin cream (MYCOSTATIN) Apply 1 Application topically in  the morning and at bedtime. Apply to bilateral groin   zinc oxide 20 % ointment Apply 1 application  topically as needed for irritation. Apply transdermally as needed for skin irritation after each incontinent episode.   No facility-administered encounter medications on file as of 02/01/2023.    Review of Systems  Constitutional:  Negative for activity change, appetite change, fatigue and fever.  Respiratory:  Negative for cough and shortness of breath.   Cardiovascular:  Negative for chest pain and leg swelling.  Gastrointestinal:  Negative for abdominal distention and abdominal pain.  Genitourinary:  Negative for decreased urine  volume, dysuria, frequency and hematuria.       Incontinence  Musculoskeletal:  Positive for arthralgias, gait problem and joint swelling. Negative for back pain.  Skin:  Positive for wound.  Neurological:  Positive for weakness. Negative for dizziness and headaches.  Psychiatric/Behavioral:  Negative for confusion and dysphoric mood. The patient is not nervous/anxious.     Immunization History  Administered Date(s) Administered   Fluad Quad(high Dose 65+) 04/01/2019, 04/11/2022   Influenza Split 04/13/2011, 05/02/2012   Influenza Whole 05/22/2007, 04/12/2008, 05/02/2009, 04/14/2010   Influenza, High Dose Seasonal PF 04/17/2013, 03/16/2014, 03/05/2016, 03/18/2017   Influenza,inj,Quad PF,6+ Mos 03/02/2015, 03/18/2018   Influenza-Unspecified 03/18/2018, 04/12/2021   Moderna Covid-19 Vaccine Bivalent Booster 45yrs & up 11/29/2021   Moderna SARS-COV2 Booster Vaccination 03/08/2021   Moderna Sars-Covid-2 Vaccination 06/22/2019, 07/20/2019, 05/02/2020, 11/23/2020   PPD Test 01/02/2015   Pneumococcal Conjugate-13 12/29/2015   Pneumococcal Polysaccharide-23 04/19/2006   Respiratory Syncytial Virus Vaccine,Recomb Aduvanted(Arexvy) 05/14/2022   Tdap 09/04/2016   Unspecified SARS-COV-2 Vaccination 04/24/2022   Zoster Recombinant(Shingrix) 09/17/2019, 03/29/2020    Zoster, Live 06/29/2015   Pertinent  Health Maintenance Due  Topic Date Due   INFLUENZA VACCINE  01/17/2023   HEMOGLOBIN A1C  06/08/2023   OPHTHALMOLOGY EXAM  12/03/2023   FOOT EXAM  01/25/2024      01/10/2022   12:47 PM 04/30/2022    2:09 PM 05/23/2022    9:44 AM 08/10/2022    3:12 PM 01/25/2023    1:55 PM  Fall Risk  Falls in the past year? 1 0 0 0 1  Was there an injury with Fall? 0 0 0 0 0  Fall Risk Category Calculator 1 0 0 0 1  Fall Risk Category (Retired) Low Low Low    (RETIRED) Patient Fall Risk Level High fall risk Low fall risk Low fall risk    Patient at Risk for Falls Due to History of fall(s);Impaired mobility No Fall Risks No Fall Risks History of fall(s);Impaired balance/gait;Impaired mobility History of fall(s);Impaired balance/gait;Impaired mobility  Fall risk Follow up Falls evaluation completed;Education provided;Falls prevention discussed Falls evaluation completed Falls evaluation completed Falls evaluation completed;Education provided Falls evaluation completed;Education provided;Falls prevention discussed   Functional Status Survey:    Vitals:   02/01/23 1245  BP: (!) 150/84  Pulse: 85  Resp: 20  Temp: 97.6 F (36.4 C)  SpO2: 93%  Weight: 171 lb 8 oz (77.8 kg)  Height: 5\' 9"  (1.753 m)   Body mass index is 25.33 kg/m. Physical Exam Vitals reviewed.  Constitutional:      General: He is not in acute distress. HENT:     Head: Normocephalic and atraumatic.     Right Ear: There is impacted cerumen.     Left Ear: There is impacted cerumen.     Ears:     Comments: Right cochlear implant    Nose: Nose normal.     Mouth/Throat:     Mouth: Mucous membranes are moist.  Eyes:     General:        Right eye: No discharge.        Left eye: No discharge.  Cardiovascular:     Rate and Rhythm: Normal rate and regular rhythm.     Pulses: Normal pulses.     Heart sounds: Normal heart sounds.  Pulmonary:     Effort: Pulmonary effort is normal. No  respiratory distress.     Breath sounds: Normal breath sounds. No wheezing.  Abdominal:     General: Bowel sounds  are normal. There is no distension.     Palpations: Abdomen is soft.     Tenderness: There is no abdominal tenderness.  Musculoskeletal:     Cervical back: Neck supple.     Right lower leg: No edema.     Left lower leg: No edema.     Comments: Able to move extremities without difficulty, bilateral hand grips 5/5, FROM to right wrist and elbow, mild swelling to right wrist  Skin:    General: Skin is warm.     Capillary Refill: Capillary refill takes less than 2 seconds.     Findings: Bruising present.     Comments: Right wrist with bruise/ no skin breakdown  Neurological:     General: No focal deficit present.     Mental Status: He is alert and oriented to person, place, and time.     Motor: Weakness present.     Gait: Gait abnormal.     Comments: wheelchair  Psychiatric:        Mood and Affect: Mood normal.     Labs reviewed: Recent Labs    09/11/22 0000  NA 142  K 4.4  CL 107  CO2 26*  BUN 33*  CREATININE 1.4*  CALCIUM 10.7   Recent Labs    09/11/22 0000  AST 17  ALT 15  ALKPHOS 35  ALBUMIN 4.3   Recent Labs    09/11/22 0000 09/24/22 0000  WBC 4.6 6.5  NEUTROABS 2,470.00 3,907.00  HGB 13.6 13.9  HCT 39* 40*  PLT 261 282   Lab Results  Component Value Date   TSH 2.15 12/07/2022   Lab Results  Component Value Date   HGBA1C 7.7 12/07/2022   Lab Results  Component Value Date   CHOL 212 (A) 09/11/2022   HDL 41 09/11/2022   LDLCALC 140 09/11/2022   LDLDIRECT 96.0 08/23/2020   TRIG 174 (A) 09/11/2022   CHOLHDL 4.4 10/21/2020    Significant Diagnostic Results in last 30 days:  No results found.  Assessment/Plan 1. Frequent falls - ongoing - associated with nocturnal enuresis - does not always use call bell  - cont blue mats - cont scheduled toileting times  2. Right wrist pain - 08/15 fall  - FROM right wrist/ hand grip  5/5 - mild tenderness - start ice applications to right wrist/elbow TID x 5 days - xray right wrist if pain increases or decreased ROM  3. Nocturnal enuresis - cont scheduled tolieting at 12 am and 4 am  4. Bilateral impacted cerumen - start Debrox 5 gtts to both ears BID x 5 days - flush ears when debrox complete    Family/ staff Communication: plan discussed with patient, daughter and nurse  Labs/tests ordered:  none

## 2023-02-03 ENCOUNTER — Emergency Department (HOSPITAL_BASED_OUTPATIENT_CLINIC_OR_DEPARTMENT_OTHER): Payer: Federal, State, Local not specified - PPO

## 2023-02-03 ENCOUNTER — Other Ambulatory Visit: Payer: Self-pay

## 2023-02-03 ENCOUNTER — Emergency Department (HOSPITAL_BASED_OUTPATIENT_CLINIC_OR_DEPARTMENT_OTHER)
Admission: EM | Admit: 2023-02-03 | Discharge: 2023-02-03 | Disposition: A | Discharge status: Home / Self Care | Payer: Federal, State, Local not specified - PPO | Source: Home / Self Care | Attending: Emergency Medicine | Admitting: Emergency Medicine

## 2023-02-03 ENCOUNTER — Emergency Department (HOSPITAL_BASED_OUTPATIENT_CLINIC_OR_DEPARTMENT_OTHER): Payer: Federal, State, Local not specified - PPO | Admitting: Radiology

## 2023-02-03 ENCOUNTER — Encounter (HOSPITAL_BASED_OUTPATIENT_CLINIC_OR_DEPARTMENT_OTHER): Payer: Self-pay | Admitting: Emergency Medicine

## 2023-02-03 DIAGNOSIS — R32 Unspecified urinary incontinence: Secondary | ICD-10-CM | POA: Diagnosis present

## 2023-02-03 DIAGNOSIS — Z7901 Long term (current) use of anticoagulants: Secondary | ICD-10-CM | POA: Insufficient documentation

## 2023-02-03 LAB — URINALYSIS, W/ REFLEX TO CULTURE (INFECTION SUSPECTED)
Bilirubin Urine: NEGATIVE
Glucose, UA: 250 mg/dL — AB
Ketones, ur: NEGATIVE mg/dL
Nitrite: NEGATIVE
Protein, ur: 30 mg/dL — AB
Specific Gravity, Urine: 1.014 (ref 1.005–1.030)
WBC, UA: >50 WBC/hpf (ref 0–5)
pH: 6 (ref 5.0–8.0)

## 2023-02-03 MED ORDER — CEPHALEXIN 500 MG PO CAPS: ORAL_CAPSULE | ORAL | 0 refills | Status: DC

## 2023-02-03 MED ORDER — CEPHALEXIN 250 MG PO CAPS
1000.0000 mg | ORAL_CAPSULE | Freq: Once | ORAL | Status: AC
  Administered 2023-02-03: 1000 mg via ORAL
  Filled 2023-02-03: qty 4

## 2023-02-03 NOTE — ED Notes (Signed)
This nurse tried to assist patient to stand at bedside to use urinal. Patient unable to maintain balance, very unsteady gait. Patient also has excoriation to bilateral groins.

## 2023-02-03 NOTE — Discharge Instructions (Signed)
The x-ray of your back showed that you have a couple broken bones at the top of your lumbar spine.  This could be new or old as you are not having significant discomfort is likely an old finding.  If you realize that you have been having significant back pain let someone know.  Your urine could be infected, with you having new urinary symptoms then I will start you on antibiotics.  Please discuss this with your primary care provider.  Please return for fever or pain in your side.

## 2023-02-03 NOTE — ED Provider Notes (Signed)
[place] EMERGENCY DEPARTMENT AT [facility] Provider Note   CSN: [id number] Arrival date & time: 02/03/23  1942     History  Chief Complaint  Patient presents with   UTI symptoms   Wrist Pain    Juan [name] is a 87 y.o. male.  87 yo M with a chief complaints of unexplained urinary incontinence.  The patient had fallen about 4 days ago.  He had struck his nose on the ground.  He was seen by one of the providers at his skilled nursing facility.  He had had some right wrist pain and so plans were for imaging tomorrow.  Family had seen him today and were worried about his urinary symptoms and so brought him in to be tested today.  They deny any confusion denies vomiting.  He celebrated a birthday yesterday.  Complaining of some pain to his nose and to his teeth.   Wrist Pain       Home Medications Prior to Admission medications   Medication Sig Start Date End Date Taking? Authorizing Provider  cephALEXin (KEFLEX) 500 MG capsule 2 caps po bid x 7 days 02/03/23  Yes Melene Plan, DO  acetaminophen (TYLENOL) 500 MG tablet Take 1,000 mg by mouth 2 (two) times daily.    [provider]  allopurinol (ZYLOPRIM) 100 MG tablet Take 100 mg by mouth daily.    [provider]  amLODipine (NORVASC) 5 MG tablet Take 1 tablet (5 mg total) by mouth daily. 10/23/22   Medina-Vargas, Monina C, NP  Choline Fenofibrate (FENOFIBRIC ACID) 135 MG CPDR Take 1 tablet by mouth in the morning.    [provider]  clopidogrel (PLAVIX) 75 MG tablet Take 75 mg by mouth daily.    [provider]  colchicine 0.6 MG tablet Take 0.6 mg by mouth daily as needed (as directed for gout flares).    [provider]  ezetimibe (ZETIA) 10 MG tablet Take 10 mg by mouth daily.    [provider]  finasteride (PROSCAR) 5 MG tablet Take 5 mg by mouth every evening.    [provider]  ipratropium-albuterol (DUONEB) 0.5-2.5 (3) MG/3ML SOLN Take 3 mLs by  nebulization every 6 (six) hours as needed.    [provider]  metFORMIN (GLUCOPHAGE) 500 MG tablet Take 500 mg by mouth every morning.    [provider]  metFORMIN (GLUCOPHAGE) 500 MG tablet Take 250 mg by mouth every evening.    [provider]  miconazole (MICOTIN) 2 % powder Apply 1 Application topically every 8 (eight) hours as needed for itching.    [provider]  nystatin cream (MYCOSTATIN) Apply 1 Application topically in the morning and at bedtime. Apply to bilateral groin    [provider]  zinc oxide 20 % ointment Apply 1 application  topically as needed for irritation. Apply transdermally as needed for skin irritation after each incontinent episode.    [provider]      Allergies    Simvastatin    Review of Systems   Review of Systems  Physical Exam Updated Vital Signs BP (!) 153/98   Pulse 96   Temp 98.2 F (36.8 C) (Oral)   Resp 18   Wt 77.8 kg   SpO2 94%   BMI 25.33 kg/m  Physical Exam Vitals and nursing note reviewed.  Constitutional:      Appearance: He is well-developed.  HENT:     Head: Normocephalic and atraumatic.  Eyes:  Pupils: Pupils are equal, round, and reactive to light.  Neck:     Vascular: No JVD.  Cardiovascular:     Rate and Rhythm: Normal rate and regular rhythm.     Heart sounds: No murmur heard.    No friction rub. No gallop.  Pulmonary:     Effort: No respiratory distress.     Breath sounds: No wheezing.  Abdominal:     General: There is no distension.     Tenderness: There is no abdominal tenderness. There is no guarding or rebound.  Musculoskeletal:        General: Normal range of motion.     Cervical back: Normal range of motion and neck supple.     Comments: There is a deformity about the patient's upper L-spine.  Not obviously tender.  Skin:    Coloration: Skin is not pale.     Findings: No rash.  Neurological:     Mental Status: He is alert and oriented to  person, place, and time.  Psychiatric:        Behavior: Behavior normal.     ED Results / Procedures / Treatments   Labs (all labs ordered are listed, but only abnormal results are displayed) Labs Reviewed  URINALYSIS, W/ REFLEX TO CULTURE (INFECTION SUSPECTED) - Abnormal; Notable for the following components:      Result Value   APPearance CLOUDY (*)    Glucose, UA 250 (*)    Hgb urine dipstick MODERATE (*)    Protein, ur 30 (*)    Leukocytes,Ua LARGE (*)    Bacteria, UA MANY (*)    All other components within normal limits  URINE CULTURE    EKG None  Radiology DG Lumbar Spine Complete  Result Date: 02/03/2023 CLINICAL DATA:  Recent fall. EXAM: LUMBAR SPINE - COMPLETE 4+ VIEW COMPARISON:  None Available. FINDINGS: Compression deformities are present at T12 and L1 with loss of vertebral body height of approximately 50% anteriorly. Scoliosis is noted. There is mild retrolisthesis at L3-L4 and L4-L5. Multilevel intervertebral disc space narrowing, degenerative endplate changes, and facet arthropathy. There is atherosclerotic calcification of the aorta. IMPRESSION: 1. Compression deformities at T12 and L1 with a proximally 50% loss of vertebral body height anteriorly, indeterminate in age. 2. Multilevel degenerative disc disease and facet arthropathy. Electronically Signed   By: Thornell Sartorius M.D.   On: 02/03/2023 21:32   DG Forearm Right  Result Date: 02/03/2023 CLINICAL DATA:  Right forearm pain.  Recent fall. EXAM: RIGHT FOREARM - 2 VIEW COMPARISON:  None Available. FINDINGS: There is no evidence of fracture or other focal bone lesions. Soft tissue swelling is present about the distal forearm. IMPRESSION: No acute fracture. Electronically Signed   By: Thornell Sartorius M.D.   On: 02/03/2023 21:28   CT Head Wo Contrast  Result Date: 02/03/2023 CLINICAL DATA:  Urinary incontinence and slurred speech since this morning. Fall on Thursday. On Plavix. EXAM: CT HEAD WITHOUT CONTRAST  TECHNIQUE: Contiguous axial images were obtained from the base of the skull through the vertex without intravenous contrast. RADIATION DOSE REDUCTION: This exam was performed according to the departmental dose-optimization program which includes automated exposure control, adjustment of the mA and/or kV according to patient size and/or use of iterative reconstruction technique. COMPARISON:  None Available. FINDINGS: Images are degraded by streak artifact from the right cochlear implant. Brain: No intracranial hemorrhage, mass effect, or evidence of acute infarct. No hydrocephalus. No extra-axial fluid collection. Age-commensurate cerebral atrophy and ill-defined hypoattenuation within  the cerebral white matter consistent with chronic small vessel ischemic disease. Vascular: No hyperdense vessel. Intracranial arterial calcification. Skull: No fracture or focal lesion. Sinuses/Orbits: No acute finding. Paranasal sinuses and mastoid air cells are well aerated. Other: None. IMPRESSION: 1. No acute intracranial abnormality. Electronically Signed   By: Minerva Fester M.D.   On: 02/03/2023 20:55    Procedures Procedures    Medications Ordered in ED Medications  cephALEXin (KEFLEX) capsule 1,000 mg (has no administration in time range)    ED Course/ Medical Decision Making/ A&P                                 Medical Decision Making Amount and/or Complexity of Data Reviewed Labs: ordered. Radiology: ordered.  Risk Prescription drug management.   87 yo M with family concern for urinary tract infection with some urinary incontinence that is unexplained.  He had a recent fall 4 days ago.  He denies any specific back pain is complaining mostly of pain to the nose and his teeth in his right forearm.  Will obtain a CT of the head plain film of the right forearm plain film of the low back.  UA with too numerous count bacteria and large leukocyte esterase.  Will start on oral antibiotics.  9:40 PM:  I  have discussed the diagnosis/risks/treatment options with the patient and family.  Evaluation and diagnostic testing in the emergency department does not suggest an emergent condition requiring admission or immediate intervention beyond what has been performed at this time.  They will follow up with PCP. We also discussed returning to the ED immediately if new or worsening sx occur. We discussed the sx which are most concerning (e.g., sudden worsening pain, fever, inability to tolerate by mouth) that necessitate immediate return. Medications administered to the patient during their visit and any new prescriptions provided to the patient are listed below.  Medications given during this visit Medications  cephALEXin (KEFLEX) capsule 1,000 mg (has no administration in time range)     The patient appears reasonably screen and/or stabilized for discharge and I doubt any other medical condition or other [facility] requiring further screening, evaluation, or treatment in the ED at this time prior to discharge.          Final Clinical Impression(s) / ED Diagnoses Final diagnoses:  Urinary incontinence, unspecified type    Rx / DC Orders ED Discharge Orders          Ordered    cephALEXin (KEFLEX) 500 MG capsule        02/03/23 2138              Melene Plan, DO 02/03/23 2140

## 2023-02-03 NOTE — ED Triage Notes (Signed)
Pt in via [facility] from [facility] with daughter, who states pt has had urinary incontinence and some more pronounced slurred speech since this morning. Pt had fall on Thursday, takes Plavix and was evaluated by doc at facility - c/o pain to nose and R wrist. Hx of R cochlear implant, speech more slurred today per the daughter

## 2023-02-04 ENCOUNTER — Encounter: Payer: Self-pay | Admitting: Nurse Practitioner

## 2023-02-04 ENCOUNTER — Non-Acute Institutional Stay (SKILLED_NURSING_FACILITY): Payer: Federal, State, Local not specified - PPO | Admitting: Nurse Practitioner

## 2023-02-04 ENCOUNTER — Telehealth: Payer: Self-pay | Admitting: *Deleted

## 2023-02-04 DIAGNOSIS — E782 Mixed hyperlipidemia: Secondary | ICD-10-CM

## 2023-02-04 DIAGNOSIS — M159 Polyosteoarthritis, unspecified: Secondary | ICD-10-CM

## 2023-02-04 DIAGNOSIS — N1832 Chronic kidney disease, stage 3b: Secondary | ICD-10-CM

## 2023-02-04 DIAGNOSIS — I10 Essential (primary) hypertension: Secondary | ICD-10-CM | POA: Diagnosis not present

## 2023-02-04 DIAGNOSIS — E1122 Type 2 diabetes mellitus with diabetic chronic kidney disease: Secondary | ICD-10-CM

## 2023-02-04 DIAGNOSIS — N138 Other obstructive and reflux uropathy: Secondary | ICD-10-CM

## 2023-02-04 DIAGNOSIS — R413 Other amnesia: Secondary | ICD-10-CM | POA: Diagnosis not present

## 2023-02-04 DIAGNOSIS — N401 Enlarged prostate with lower urinary tract symptoms: Secondary | ICD-10-CM

## 2023-02-04 DIAGNOSIS — N390 Urinary tract infection, site not specified: Secondary | ICD-10-CM | POA: Insufficient documentation

## 2023-02-04 DIAGNOSIS — G459 Transient cerebral ischemic attack, unspecified: Secondary | ICD-10-CM

## 2023-02-04 DIAGNOSIS — I2510 Atherosclerotic heart disease of native coronary artery without angina pectoris: Secondary | ICD-10-CM

## 2023-02-04 NOTE — Telephone Encounter (Signed)
Patient is a Radiographer, therapeutic.

## 2023-02-04 NOTE — Assessment & Plan Note (Signed)
on Novasc

## 2023-02-04 NOTE — Assessment & Plan Note (Signed)
on Zetia, Fenofibrate

## 2023-02-04 NOTE — Assessment & Plan Note (Signed)
s/p CABG, takes Plavix, Zetia

## 2023-02-04 NOTE — Assessment & Plan Note (Signed)
takes Metformin, Hgb A1c 7.7 12/07/22, Bun/creat 33/1.4 09/11/22

## 2023-02-04 NOTE — Progress Notes (Unsigned)
Location:   SNF [facility] Room Number: 11 Place of Service:  SNF (31) Provider: Arna Snipe Shedrick Sarli NP  [name], NP  Patient Care Team: [name], NP as PCP - General (Adult Health Nurse Practitioner) [name], MD as PCP - Cardiology (Cardiology)  Extended Emergency Contact Information Primary Emergency Contact: [name],[name]          [place], [place] Home Phone: [phone] Mobile Phone: [phone] Relation: Daughter Secondary Emergency Contact: [name],[name] Address: [address]          [place], [place] [address] [name] Home Phone: [phone] Mobile Phone: [phone] Relation: Daughter  Code Status:  DNR Goals of care: Advanced Directive information    02/03/2023    7:55 PM  Advanced Directives  Does Patient Have a Medical Advance Directive? Yes  Type of Estate agent of [place];Living will  Does patient want to make changes to medical advance directive? No - Patient declined  Copy of Healthcare Power of Attorney in Chart? Yes - validated most recent copy scanned in chart (See row information)     No chief complaint on file.   HPI:  Pt is a 87 y.o. male seen today for an acute visit for f/u ED evaluation  ED 02/03/23 for observed the patient's shaking uncontrollably, increased confusion, had fall 01/31/23 with facial contusion. He was found to have UTI symptoms(chronic urinary frequency), started Keflex. Wrist pain R, Xray of R forearm, L spine, CT head unremarkable.   02/04/23 the patient exhibited confusion, restlessness, unwitnessed fall, Seroquel 50mg  x1 was effective, Trazodone 50mg  at bedtime prescribed as well. TSH 2.15 12/07/22  BPH, urinary frequency, on Proscar  HTN, on Novasc  T2DM takes Metformin, Hgb A1c 7.7 12/07/22  TIA on Plavix  CAD s/p CABG, takes Plavix, Zetia  HLD, on Zetia, Fenofibrate  CKD Bun/creat 33/1.4 09/11/22  Hx of Gout, Colchicine   Past Medical History:   Diagnosis Date  . Cancer (HCC)    skin  . CKD (chronic kidney disease) 06/24/2013  . Cochlear implant in place   . Coronary artery disease    a. s/p CABG 2000; b. myoview 9/08: inf-septal scar with mild peri-infarct ischemia (low risk);  c.  Echo 8/13: mild focal basal septal hypertrophy, Gr 1 DD, mild LAE;  d. Lexiscan Myoview (06/20/2013): No reversible ischemia, inferior and septal infarct, inferior and septal hypokinesis, EF 60%;  e.  Echo (06/20/2011): EF 50-55%, normal wall motion, grade 1 diastolic dysfunction, mild LAE  . Diverticular disease   . Duodenitis   . Elevated PSA   . GERD (gastroesophageal reflux disease)   . Gout   . Humerus fracture    right  . Hydronephrosis with ureteropelvic junction obstruction   . Hypercalcemia 02/02/2012   a. 01/2012: 10.7.  Marland Kitchen Hyperlipidemia   . Hypertension   . Pancreatitis    a. ? Simvastatin related  . Pneumonia age 5 or 7   "I think only once" (06/19/2013)  . Syncope    a. 06/2013  . TIA (transient ischemic attack)    a. 01/2012 - presentation as acute imbalance;  b. 01/2012 carotid U/S w/o signif stenosis;  c. 01/2012 Echo: EF 60-65%, Gr 1 DD.;  d.  Carotid US (06/20/2013): 1-39% bilateral ICA stenosis  . Tubular adenoma polyp of rectum   . Type II diabetes mellitus (HCC)    "diet and exercise controlled at present" (06/19/2013)   Past Surgical History:  Procedure Laterality Date  .  APPENDECTOMY    . CARDIAC CATHETERIZATION  2000  . CHOLECYSTECTOMY    . COCHLEAR IMPLANT Right 1990's  . COLONOSCOPY W/ POLYPECTOMY     no polyp 2012  . CORONARY ARTERY BYPASS GRAFT  2000   CABG X5  . INGUINAL HERNIA REPAIR Right 09/15/2015   Procedure: OPEN REPAIR RIGHT INGUINAL HERNIA ;  Surgeon: Avel Peace, MD;  Location: [facility];  Service: General;  Laterality: Right;  . INSERTION OF MESH Right 09/15/2015   Procedure: INSERTION OF MESH;  Surgeon: Avel Peace, MD;  Location: [facility];  Service: General;  Laterality: Right;  . PARATHYROIDECTOMY  1978   . POLYPECTOMY    . REVERSE SHOULDER ARTHROPLASTY Right 12/30/2014   Procedure: Right shoulder ORIF, Biomet S3 Plate and ZImmer cable system ;  Surgeon: Beverely Low, MD;  Location: [facility] OR;  Service: Orthopedics;  Laterality: Right;  . right ankle surgery  age 10  . TONSILLECTOMY  1943  . trachestomy as child for throat closing      Allergies  Allergen Reactions  . Simvastatin Other (See Comments)    Possible statin-induced pancreatitis    Allergies as of 02/04/2023       Reactions   Simvastatin Other (See Comments)   Possible statin-induced pancreatitis        Medication List        Accurate as of February 04, 2023  4:08 PM. If you have any questions, ask your nurse or doctor.          acetaminophen 500 MG tablet Commonly known as: TYLENOL Take 1,000 mg by mouth 2 (two) times daily.   allopurinol 100 MG tablet Commonly known as: ZYLOPRIM Take 100 mg by mouth daily.   amLODipine 5 MG tablet Commonly known as: NORVASC Take 1 tablet (5 mg total) by mouth daily.   carbamide peroxide 6.5 % OTIC solution Commonly known as: DEBROX Place 5 drops into both ears 2 (two) times daily.   cephALEXin 500 MG capsule Commonly known as: Keflex 2 caps po bid x 7 days   clopidogrel 75 MG tablet Commonly known as: PLAVIX Take 75 mg by mouth daily.   colchicine 0.6 MG tablet Take 0.6 mg by mouth daily as needed (as directed for gout flares).   ezetimibe 10 MG tablet Commonly known as: ZETIA Take 10 mg by mouth daily.   Fenofibric Acid 135 MG Cpdr Take 1 tablet by mouth in the morning.   finasteride 5 MG tablet Commonly known as: PROSCAR Take 5 mg by mouth every evening.   ipratropium-albuterol 0.5-2.5 (3) MG/3ML Soln Commonly known as: DUONEB Take 3 mLs by nebulization every 6 (six) hours as needed.   metFORMIN 500 MG tablet Commonly known as: GLUCOPHAGE Take 500 mg by mouth every morning.   metFORMIN 500 MG tablet Commonly known as: GLUCOPHAGE Take 250 mg by  mouth every evening.   miconazole 2 % powder Commonly known as: MICOTIN Apply 1 Application topically every 8 (eight) hours as needed for itching.   nystatin cream Commonly known as: MYCOSTATIN Apply 1 Application topically in the morning and at bedtime. Apply to bilateral groin   traZODone 50 MG tablet Commonly known as: DESYREL Take 50 mg by mouth.   zinc oxide 20 % ointment Apply 1 application  topically as needed for irritation. Apply transdermally as needed for skin irritation after each incontinent episode.        Review of Systems  Constitutional:  Negative for appetite change, fatigue and fever.  HENT:  Positive for hearing loss and rhinorrhea. Negative for congestion and trouble swallowing.        R cochlear implant  Eyes:  Negative for visual disturbance.  Respiratory:  Negative for cough, shortness of breath and wheezing.        DOE  Cardiovascular:  Negative for leg swelling.  Gastrointestinal:  Negative for abdominal pain and constipation.  Genitourinary:  Positive for frequency. Negative for dysuria, hematuria and urgency.       1-2x/night.   Musculoskeletal:  Positive for arthralgias and gait problem. Negative for back pain.       R wrist pain, better, no decreased ROM, warmth, or swelling.   Skin:  Negative for color change.  Neurological:  Negative for speech difficulty, weakness and headaches.       Memory lapses.   Psychiatric/Behavioral:  Positive for confusion and sleep disturbance. The patient is nervous/anxious.     Immunization History  Administered Date(s) Administered  . Fluad Quad(high Dose 65+) 04/01/2019, 04/11/2022  . Influenza Split 04/13/2011, 05/02/2012  . Influenza Whole 05/22/2007, 04/12/2008, 05/02/2009, 04/14/2010  . Influenza, High Dose Seasonal PF 04/17/2013, 03/16/2014, 03/05/2016, 03/18/2017  . Influenza,inj,Quad PF,6+ Mos 03/02/2015, 03/18/2018  . Influenza-Unspecified 03/18/2018, 04/12/2021  . Moderna Covid-19 Vaccine Bivalent  Booster 70yrs & up 11/29/2021  . Moderna SARS-COV2 Booster Vaccination 03/08/2021  . Moderna Sars-Covid-2 Vaccination 06/22/2019, 07/20/2019, 05/02/2020, 11/23/2020  . PPD Test 01/02/2015  . Pneumococcal Conjugate-13 12/29/2015  . Pneumococcal Polysaccharide-23 04/19/2006  . Respiratory Syncytial Virus Vaccine,Recomb Aduvanted(Arexvy) 05/14/2022  . Tdap 09/04/2016  . Unspecified SARS-COV-2 Vaccination 04/24/2022  . Zoster Recombinant(Shingrix) 09/17/2019, 03/29/2020  . Zoster, Live 06/29/2015   Pertinent  Health Maintenance Due  Topic Date Due  . INFLUENZA VACCINE  01/17/2023  . HEMOGLOBIN A1C  06/08/2023  . OPHTHALMOLOGY EXAM  12/03/2023  . FOOT EXAM  01/25/2024      04/30/2022    2:09 PM 05/23/2022    9:44 AM 08/10/2022    3:12 PM 01/25/2023    1:55 PM 02/04/2023   11:25 AM  Fall Risk  Falls in the past year? 0 0 0 1 1  Was there an injury with Fall? 0 0 0 0 1  Fall Risk Category Calculator 0 0 0 1 3  Fall Risk Category (Retired) Low Low     (RETIRED) Patient Fall Risk Level Low fall risk Low fall risk     Patient at Risk for Falls Due to No Fall Risks No Fall Risks History of fall(s);Impaired balance/gait;Impaired mobility History of fall(s);Impaired balance/gait;Impaired mobility History of fall(s);Impaired balance/gait;Impaired mobility  Fall risk Follow up Falls evaluation completed Falls evaluation completed Falls evaluation completed;Education provided Falls evaluation completed;Education provided;Falls prevention discussed Falls evaluation completed;Education provided;Falls prevention discussed   Functional Status Survey:    Vitals:   02/04/23 1439  BP: (!) 140/82  Pulse: 85  Resp: 18  Temp: 98.7 F (37.1 C)  SpO2: 95%  Weight: 171 lb 8 oz (77.8 kg)   Body mass index is 25.33 kg/m. Physical Exam Vitals and nursing note reviewed.  Constitutional:      Comments: sleepy  HENT:     Head: Normocephalic and atraumatic.     Nose: Nose normal.     Mouth/Throat:      Mouth: Mucous membranes are moist.  Eyes:     Extraocular Movements: Extraocular movements intact.     Conjunctiva/sclera: Conjunctivae normal.     Pupils: Pupils are equal, round, and reactive to light.  Cardiovascular:     Rate  and Rhythm: Normal rate and regular rhythm.     Heart sounds: No murmur heard. Pulmonary:     Effort: Pulmonary effort is normal.     Breath sounds: No rales.  Abdominal:     General: Bowel sounds are normal.     Palpations: Abdomen is soft.     Tenderness: There is no abdominal tenderness. There is no right CVA tenderness, left CVA tenderness, guarding or rebound.  Musculoskeletal:        General: Tenderness present. Normal range of motion.     Cervical back: Normal range of motion and neck supple.     Right lower leg: No edema.     Left lower leg: No edema.  Skin:    General: Skin is warm and dry.     Findings: Bruising present.     Comments: Bruise/abrasion upper lip, bruise right forearm, R+L knee, incidental finding: left leg is slightly thicker than the right, no apparent edema.   Neurological:     General: No focal deficit present.     Mental Status: He is alert and oriented to person, place, and time. Mental status is at baseline.     Gait: Gait abnormal.  Psychiatric:        Mood and Affect: Mood normal.     Comments: restlessness    Labs reviewed: Recent Labs    09/11/22 0000  NA 142  K 4.4  CL 107  CO2 26*  BUN 33*  CREATININE 1.4*  CALCIUM 10.7   Recent Labs    09/11/22 0000  AST 17  ALT 15  ALKPHOS 35  ALBUMIN 4.3   Recent Labs    09/11/22 0000 09/24/22 0000  WBC 4.6 6.5  NEUTROABS 2,470.00 3,907.00  HGB 13.6 13.9  HCT 39* 40*  PLT 261 282   Lab Results  Component Value Date   TSH 2.15 12/07/2022   Lab Results  Component Value Date   HGBA1C 7.7 12/07/2022   Lab Results  Component Value Date   CHOL 212 (A) 09/11/2022   HDL 41 09/11/2022   LDLCALC 140 09/11/2022   LDLDIRECT 96.0 08/23/2020   TRIG 174  (A) 09/11/2022   CHOLHDL 4.4 10/21/2020    Significant Diagnostic Results in last 30 days:  DG Lumbar Spine Complete  Result Date: 02/03/2023 CLINICAL DATA:  Recent fall. EXAM: LUMBAR SPINE - COMPLETE 4+ VIEW COMPARISON:  None Available. FINDINGS: Compression deformities are present at T12 and L1 with loss of vertebral body height of approximately 50% anteriorly. Scoliosis is noted. There is mild retrolisthesis at L3-L4 and L4-L5. Multilevel intervertebral disc space narrowing, degenerative endplate changes, and facet arthropathy. There is atherosclerotic calcification of the aorta. IMPRESSION: 1. Compression deformities at T12 and L1 with a proximally 50% loss of vertebral body height anteriorly, indeterminate in age. 2. Multilevel degenerative disc disease and facet arthropathy. Electronically Signed   By: Thornell Sartorius M.D.   On: 02/03/2023 21:32   DG Forearm Right  Result Date: 02/03/2023 CLINICAL DATA:  Right forearm pain.  Recent fall. EXAM: RIGHT FOREARM - 2 VIEW COMPARISON:  None Available. FINDINGS: There is no evidence of fracture or other focal bone lesions. Soft tissue swelling is present about the distal forearm. IMPRESSION: No acute fracture. Electronically Signed   By: Thornell Sartorius M.D.   On: 02/03/2023 21:28   CT Head Wo Contrast  Result Date: 02/03/2023 CLINICAL DATA:  Urinary incontinence and slurred speech since this morning. Fall on Thursday. On Plavix. EXAM: CT HEAD  WITHOUT CONTRAST TECHNIQUE: Contiguous axial images were obtained from the base of the skull through the vertex without intravenous contrast. RADIATION DOSE REDUCTION: This exam was performed according to the departmental dose-optimization program which includes automated exposure control, adjustment of the mA and/or kV according to patient size and/or use of iterative reconstruction technique. COMPARISON:  None Available. FINDINGS: Images are degraded by streak artifact from the right cochlear implant. Brain: No  intracranial hemorrhage, mass effect, or evidence of acute infarct. No hydrocephalus. No extra-axial fluid collection. Age-commensurate cerebral atrophy and ill-defined hypoattenuation within the cerebral white matter consistent with chronic small vessel ischemic disease. Vascular: No hyperdense vessel. Intracranial arterial calcification. Skull: No fracture or focal lesion. Sinuses/Orbits: No acute finding. Paranasal sinuses and mastoid air cells are well aerated. Other: None. IMPRESSION: 1. No acute intracranial abnormality. Electronically Signed   By: Minerva Fester M.D.   On: 02/03/2023 20:55    Assessment/Plan UTI (urinary tract infection) ED 02/03/23 for observed the patient's shaking uncontrollably, increased confusion, had fall 01/31/23 with facial contusion. He was found to have UTI symptoms(chronic urinary frequency), started Keflex. Wrist pain R, Xray of R forearm, L spine, CT head unremarkable.   Memory deficit 02/04/23 the patient exhibited confusion, restlessness, unwitnessed fall, Seroquel 50mg  x1 was effective, but slept excessively today, also Trazodone 50mg  at bedtime prescribed as well-the patient has no history of insomnia, anxiety, or depression, will continue to observe, delay Trazodone. TSH 2.15 12/07/22 The patient and HPOA declined CBC/diff, CMP/eGFR   Benign prostatic hyperplasia with urinary obstruction urinary frequency, on Proscar  Essential hypertension, benign on Novasc  Type 2 diabetes mellitus with renal complication (HCC) takes Metformin, Hgb A1c 7.7 12/07/22, Bun/creat 33/1.4 09/11/22  TIA (transient ischemic attack) on Plavix  Coronary atherosclerosis s/p CABG, takes Plavix, Zetia  Mixed hyperlipidemia  on Zetia, Fenofibrate  Osteoarthritis, multiple sites 02/03/23 T12, L1 compression, DDD, chronic lower back pain positional.     Family/ staff Communication: plan of care reviewed with the patient, the patient's daughter, and charge nurse.   Labs/tests  ordered: none  Time spend 35 minutes.

## 2023-02-04 NOTE — Assessment & Plan Note (Signed)
ED 02/03/23 for observed the patient's shaking uncontrollably, increased confusion, had fall 01/31/23 with facial contusion. He was found to have UTI symptoms(chronic urinary frequency), started Keflex. Wrist pain R, Xray of R forearm, L spine, CT head unremarkable.

## 2023-02-04 NOTE — Assessment & Plan Note (Signed)
on Plavix

## 2023-02-04 NOTE — Assessment & Plan Note (Signed)
02/03/23 T12, L1 compression, DDD, chronic lower back pain positional.

## 2023-02-04 NOTE — Assessment & Plan Note (Addendum)
02/04/23 the patient exhibited confusion, restlessness, unwitnessed fall, Seroquel 50mg  x1 was effective, but slept excessively today, also Trazodone 50mg  at bedtime prescribed as well-the patient has no history of insomnia, anxiety, or depression, will continue to observe, delay Trazodone. TSH 2.15 12/07/22 The patient and HPOA declined CBC/diff, CMP/eGFR

## 2023-02-04 NOTE — Assessment & Plan Note (Signed)
urinary frequency, on Proscar

## 2023-02-06 LAB — URINE CULTURE: Culture: >=100000 — AB

## 2023-02-07 ENCOUNTER — Telehealth (HOSPITAL_BASED_OUTPATIENT_CLINIC_OR_DEPARTMENT_OTHER): Payer: Self-pay

## 2023-02-07 NOTE — Telephone Encounter (Signed)
Post ED Visit - Positive Culture Follow-up  Culture report reviewed by antimicrobial stewardship pharmacist: [facility] [x]  Ivery Quale, [place].D. []  Celedonio Miyamoto, Pharm.D., BCPS AQ-ID []  Garvin Fila, Pharm.D., BCPS []  Georgina Pillion, Pharm.D., BCPS []  [place], [address].D., BCPS, AAHIVP []  Estella Husk, Pharm.D., BCPS, AAHIVP []  Lysle Pearl, PharmD, BCPS []  Phillips Climes, PharmD, BCPS []  Agapito Games, PharmD, BCPS []  Verlan Friends, PharmD []  Mervyn Gay, PharmD, BCPS []  Vinnie Level, PharmD  [facility] []  Len Childs, PharmD []  Greer Pickerel, PharmD []  Adalberto Cole, PharmD []  Perlie Gold, Rph []  Lonell Face) Jean Rosenthal, PharmD []  Earl Many, PharmD []  Junita Push, PharmD []  Dorna Leitz, PharmD []  Terrilee Files, PharmD []  Lynann Beaver, PharmD []  Keturah Barre, PharmD []  Loralee Pacas, PharmD []  Bernadene Person, PharmD   Positive urine culture Treated with Cephalexin, organism sensitive to the same and no further patient follow-up is required at this time.  Sandria Senter 02/07/2023, 8:46 AM

## 2023-02-22 ENCOUNTER — Encounter: Payer: Self-pay | Admitting: Orthopedic Surgery

## 2023-02-22 ENCOUNTER — Non-Acute Institutional Stay (SKILLED_NURSING_FACILITY): Payer: Federal, State, Local not specified - PPO | Admitting: Orthopedic Surgery

## 2023-02-22 DIAGNOSIS — G459 Transient cerebral ischemic attack, unspecified: Secondary | ICD-10-CM | POA: Diagnosis not present

## 2023-02-22 DIAGNOSIS — I2510 Atherosclerotic heart disease of native coronary artery without angina pectoris: Secondary | ICD-10-CM

## 2023-02-22 DIAGNOSIS — N138 Other obstructive and reflux uropathy: Secondary | ICD-10-CM

## 2023-02-22 DIAGNOSIS — I10 Essential (primary) hypertension: Secondary | ICD-10-CM

## 2023-02-22 DIAGNOSIS — E1122 Type 2 diabetes mellitus with diabetic chronic kidney disease: Secondary | ICD-10-CM

## 2023-02-22 DIAGNOSIS — R413 Other amnesia: Secondary | ICD-10-CM

## 2023-02-22 DIAGNOSIS — R296 Repeated falls: Secondary | ICD-10-CM

## 2023-02-22 DIAGNOSIS — N401 Enlarged prostate with lower urinary tract symptoms: Secondary | ICD-10-CM

## 2023-02-22 DIAGNOSIS — N3944 Nocturnal enuresis: Secondary | ICD-10-CM | POA: Diagnosis not present

## 2023-02-22 DIAGNOSIS — N1831 Chronic kidney disease, stage 3a: Secondary | ICD-10-CM

## 2023-02-22 DIAGNOSIS — H9191 Unspecified hearing loss, right ear: Secondary | ICD-10-CM

## 2023-02-22 NOTE — Progress Notes (Unsigned)
Location:   [facility]  Nursing Home Room Number: 11-A Place of Service:  SNF (31) Provider:  [name], NP    Patient Care Team: Juan Heir, NP as PCP - General (Adult Health Nurse Practitioner) [name], MD as PCP - Cardiology (Cardiology)  Extended Emergency Contact Information Primary Emergency Contact: Juan [name]          [name] Home Phone: [phone] Mobile Phone: [phone] Relation: Daughter Secondary Emergency Contact: [name],[name] Address: [address]          [place], [place] [address] [name] Home Phone: [phone] Mobile Phone: [phone] Relation: Daughter  Code Status:  DNR Goals of care: Advanced Directive information    02/22/2023   11:49 AM  Advanced Directives  Does Patient Have a Medical Advance Directive? Yes  Type of Estate agent of [place];Living will;Out of facility DNR (pink MOST or yellow form)  Does patient want to make changes to medical advance directive? No - Patient declined  Copy of Healthcare Power of Attorney in Chart? Yes - validated most recent copy scanned in chart (See row information)     Chief Complaint  Patient presents with   Medical Management of Chronic Issues    Routine Visit.     HPI:  Pt is a 87 y.o. male seen today for medical management of chronic diseases.    Expand All Collapse All  Location:   [facility]  Nursing Home Room Number: 11-A Place of Service:  SNF (31) Provider:  [name], NP       Patient Care Team: Juan Heir, NP as PCP - General (Adult Health Nurse Practitioner) [name], MD as PCP - Cardiology (Cardiology)   Extended Emergency Contact Information Primary Emergency Contact: Juan [name]          [place], [place] Home Phone: [phone] Mobile Phone: [phone] Relation: Daughter Secondary Emergency Contact: [name],[name] Address: [address]           [place], [place] [address] [name] Home Phone: [phone] Mobile Phone: [phone] Relation: Daughter   Code Status:  DNR Goals of care: Advanced Directive information     01/25/2023    9:02 AM  Advanced Directives  Does Patient Have a Medical Advance Directive? Yes  Type of Estate agent of [place];Living will;Out of facility DNR (pink MOST or yellow form)  Does patient want to make changes to medical advance directive? No - Patient declined  Copy of Healthcare Power of Attorney in Chart? Yes - validated most recent copy scanned in chart (See row information)            Chief Complaint  Patient presents with   Medical Management of Chronic Issues      Routine Visit.    Immunizations      Discuss the need for Influenza vaccine.       HPI:  Pt is a 87 y.o. male seen today for medical management of chronic diseases.       He currently resides on the skilled nursing unit at [facility]. PMH: CAD, s/p CABG 2000, HTN, TIA 2022, T2DM, HOH- cochlear implant, OA, BPH, CKD, memory deficit, shuffling gait, and weight loss.     Frequent falls- 2 falls reported within last 2 months, thought to be associated with nocturia> 08/18 ED visit> UTI> resolved with Keflex, family rearranged room, blue mats at bedtime, 08/23 family agreed  for PT evaluation  T2DM- A1c 7.7 (06/21)> was  7.6 (03/26), blood sugars 150-180's, no hypoglycemias, urine microalbumin 31 04/27/2022, eye exam 12/03/2022, regular diet, remains on metformin Nocturnal enuresis- past urine culture > 100,000 klebsiella pneumoniae 02/03/2023, improved with scheduled toileting at midnight and 4 am HxTIA- no further workup per neurology, remains on plavix and fenofibrate> allergy to statin CAD- H/o CABG, followed by cardiology, 10/2020 LVEF 60-65%, remains on plavix HTN- BUN/creat 33/1.4 09/11/2022, remains on amlodipine CKD- see above Memory deficit- BIMS score 15/15 (08/05)> was 15/15  (05/03), CT head 12/2020 atrophy and chronic microvascular ischemic changes noted, no behaviors BPH- remains on finasteride Sensory hearing loss- followed by Dr. Manson Passey, right cochlear implant in place     Recent weights:  09/06- 163 lbs  08/26- 167.8 lbs  08/03- 173.4 lbs  07/01- 172.9 lbs  Recent blood pressures:  09/03- 118/80  08/27- 140/68  08/25- 128/78   Past Medical History:  Diagnosis Date   Cancer (HCC)    skin   CKD (chronic kidney disease) 06/24/2013   Cochlear implant in place    Coronary artery disease    a. s/p CABG 2000; b. myoview 9/08: inf-septal scar with mild peri-infarct ischemia (low risk);  c.  Echo 8/13: mild focal basal septal hypertrophy, Gr 1 DD, mild LAE;  d. Lexiscan Myoview (06/20/2013): No reversible ischemia, inferior and septal infarct, inferior and septal hypokinesis, EF 60%;  e.  Echo (06/20/2011): EF 50-55%, normal wall motion, grade 1 diastolic dysfunction, mild LAE   Diverticular disease    Duodenitis    Elevated PSA    GERD (gastroesophageal reflux disease)    Gout    Humerus fracture    right   Hydronephrosis with ureteropelvic junction obstruction    Hypercalcemia 02/02/2012   a. 01/2012: 10.7.   Hyperlipidemia    Hypertension    Pancreatitis    a. ? Simvastatin related   Pneumonia age 89 or 7   "I think only once" (06/19/2013)   Syncope    a. 06/2013   TIA (transient ischemic attack)    a. 01/2012 - presentation as acute imbalance;  b. 01/2012 carotid U/S w/o signif stenosis;  c. 01/2012 Echo: EF 60-65%, Gr 1 DD.;  d.  Carotid US (06/20/2013): 1-39% bilateral ICA stenosis   Tubular adenoma polyp of rectum    Type II diabetes mellitus (HCC)    "diet and exercise controlled at present" (06/19/2013)   Past Surgical History:  Procedure Laterality Date   APPENDECTOMY     CARDIAC CATHETERIZATION  2000   CHOLECYSTECTOMY     COCHLEAR IMPLANT Right 1990's   COLONOSCOPY W/ POLYPECTOMY     no polyp 2012   CORONARY ARTERY BYPASS GRAFT  2000   CABG  X5   INGUINAL HERNIA REPAIR Right 09/15/2015   Procedure: OPEN REPAIR RIGHT INGUINAL HERNIA ;  Surgeon: Avel Peace, MD;  Location: [facility] ORS;  Service: General;  Laterality: Right;   INSERTION OF MESH Right 09/15/2015   Procedure: INSERTION OF MESH;  Surgeon: Avel Peace, MD;  Location: [facility];  Service: General;  Laterality: Right;   PARATHYROIDECTOMY  1978   POLYPECTOMY     REVERSE SHOULDER ARTHROPLASTY Right 12/30/2014   Procedure: Right shoulder ORIF, Biomet S3 Plate and ZImmer cable system ;  Surgeon: Beverely Low, MD;  Location: [facility];  Service: Orthopedics;  Laterality: Right;   right ankle surgery  age 21   TONSILLECTOMY  1943   trachestomy as child for throat closing  Allergies  Allergen Reactions   Simvastatin Other (See Comments)    Possible statin-induced pancreatitis    Allergies as of 02/22/2023       Reactions   Simvastatin Other (See Comments)   Possible statin-induced pancreatitis        Medication List        Accurate as of February 22, 2023 11:49 AM. If you have any questions, ask your nurse or doctor.          STOP taking these medications    cephALEXin 500 MG capsule Commonly known as: Keflex Stopped by: Dashauna Heymann E Samreet [place]   nystatin cream Commonly known as: MYCOSTATIN Stopped by: Juan [name]   traZODone 50 MG tablet Commonly known as: DESYREL Stopped by: Juan [name]       TAKE these medications    acetaminophen 500 MG tablet Commonly known as: TYLENOL Take 1,000 mg by mouth 2 (two) times daily.   allopurinol 100 MG tablet Commonly known as: ZYLOPRIM Take 100 mg by mouth daily.   amLODipine 5 MG tablet Commonly known as: NORVASC Take 1 tablet (5 mg total) by mouth daily.   clopidogrel 75 MG tablet Commonly known as: PLAVIX Take 75 mg by mouth daily.   colchicine 0.6 MG tablet Take 0.6 mg by mouth daily as needed (as directed for gout flares).   ezetimibe 10 MG tablet Commonly known as: ZETIA Take 10 mg by mouth daily.    Fenofibric Acid 135 MG Cpdr Take 1 tablet by mouth in the morning.   finasteride 5 MG tablet Commonly known as: PROSCAR Take 5 mg by mouth every evening.   ipratropium-albuterol 0.5-2.5 (3) MG/3ML Soln Commonly known as: DUONEB Take 3 mLs by nebulization every 6 (six) hours as needed.   metFORMIN 500 MG tablet Commonly known as: GLUCOPHAGE Take 500 mg by mouth every morning.   metFORMIN 500 MG tablet Commonly known as: GLUCOPHAGE Take 250 mg by mouth every evening.   miconazole 2 % powder Commonly known as: MICOTIN Apply 1 Application topically daily at 12 noon.   zinc oxide 20 % ointment Apply 1 application  topically as needed for irritation. Apply transdermally as needed for skin irritation after each incontinent episode.        Review of Systems  Constitutional:  Negative for activity change and appetite change.  HENT:  Positive for hearing loss. Negative for trouble swallowing.   Eyes:  Negative for visual disturbance.  Respiratory:  Negative for cough, shortness of breath and wheezing.   Cardiovascular:  Negative for chest pain and leg swelling.  Gastrointestinal:  Negative for abdominal distention and abdominal pain.  Genitourinary:  Positive for frequency. Negative for dysuria and hematuria.  Musculoskeletal:  Positive for gait problem.  Neurological:  Positive for weakness. Negative for dizziness and headaches.  Psychiatric/Behavioral:  Positive for confusion. Negative for dysphoric mood. The patient is not nervous/anxious.     Immunization History  Administered Date(s) Administered   Fluad Quad(high Dose 65+) 04/01/2019, 04/11/2022   Influenza Split 04/13/2011, 05/02/2012   Influenza Whole 05/22/2007, 04/12/2008, 05/02/2009, 04/14/2010   Influenza, High Dose Seasonal PF 04/17/2013, 03/16/2014, 03/05/2016, 03/18/2017   Influenza,inj,Quad PF,6+ Mos 03/02/2015, 03/18/2018   Influenza-Unspecified 03/18/2018, 04/12/2021   Moderna Covid-19 Vaccine Bivalent  Booster 45yrs & up 11/29/2021   Moderna SARS-COV2 Booster Vaccination 03/08/2021   Moderna Sars-Covid-2 Vaccination 06/22/2019, 07/20/2019, 05/02/2020, 11/23/2020   PPD Test 01/02/2015   Pneumococcal Conjugate-13 12/29/2015   Pneumococcal Polysaccharide-23 04/19/2006   Respiratory Syncytial Virus Vaccine,Recomb  Aduvanted(Arexvy) 05/14/2022   Tdap 09/04/2016   Unspecified SARS-COV-2 Vaccination 04/24/2022   Zoster Recombinant(Shingrix) 09/17/2019, 03/29/2020   Zoster, Live 06/29/2015   Pertinent  Health Maintenance Due  Topic Date Due   INFLUENZA VACCINE  01/17/2023   HEMOGLOBIN A1C  06/08/2023   OPHTHALMOLOGY EXAM  12/03/2023   FOOT EXAM  01/25/2024      04/30/2022    2:09 PM 05/23/2022    9:44 AM 08/10/2022    3:12 PM 01/25/2023    1:55 PM 02/04/2023   11:25 AM  Fall Risk  Falls in the past year? 0 0 0 1 1  Was there an injury with Fall? 0 0 0 0 1  Fall Risk Category Calculator 0 0 0 1 3  Fall Risk Category (Retired) Low Low     (RETIRED) Patient Fall Risk Level Low fall risk Low fall risk     Patient at Risk for Falls Due to No Fall Risks No Fall Risks History of fall(s);Impaired balance/gait;Impaired mobility History of fall(s);Impaired balance/gait;Impaired mobility History of fall(s);Impaired balance/gait;Impaired mobility  Fall risk Follow up Falls evaluation completed Falls evaluation completed Falls evaluation completed;Education provided Falls evaluation completed;Education provided;Falls prevention discussed Falls evaluation completed;Education provided;Falls prevention discussed   Functional Status Survey:    Vitals:   02/22/23 1146  BP: 118/80  Pulse: 80  Resp: 20  Temp: (!) 97.4 F (36.3 C)  SpO2: 95%  Weight: 163 lb (73.9 kg)  Height: 5\' 9"  (1.753 m)   Body mass index is 24.07 kg/m. Physical Exam Vitals reviewed.  Constitutional:      General: He is not in acute distress. HENT:     Head: Normocephalic.     Right Ear: There is no impacted cerumen.      Left Ear: There is no impacted cerumen.     Ears:     Comments: Right cochlear implant    Nose: Nose normal.     Mouth/Throat:     Mouth: Mucous membranes are moist.  Eyes:     General:        Right eye: No discharge.        Left eye: No discharge.  Neck:     Vascular: No carotid bruit.  Cardiovascular:     Rate and Rhythm: Normal rate and regular rhythm.     Pulses: Normal pulses.     Heart sounds: Normal heart sounds.  Pulmonary:     Effort: Pulmonary effort is normal. No respiratory distress.     Breath sounds: Normal breath sounds. No wheezing.  Abdominal:     General: Bowel sounds are normal.     Palpations: Abdomen is soft.  Musculoskeletal:     Cervical back: Neck supple.     Right lower leg: No edema.     Left lower leg: No edema.  Lymphadenopathy:     Cervical: No cervical adenopathy.  Skin:    General: Skin is warm.     Capillary Refill: Capillary refill takes less than 2 seconds.  Neurological:     General: No focal deficit present.     Mental Status: He is alert and oriented to person, place, and time.     Motor: Weakness present.     Gait: Gait abnormal.     Comments: wheelchair  Psychiatric:        Mood and Affect: Mood normal.     Labs reviewed: Recent Labs    09/11/22 0000  NA 142  K 4.4  CL 107  CO2  26*  BUN 33*  CREATININE 1.4*  CALCIUM 10.7   Recent Labs    09/11/22 0000  AST 17  ALT 15  ALKPHOS 35  ALBUMIN 4.3   Recent Labs    09/11/22 0000 09/24/22 0000  WBC 4.6 6.5  NEUTROABS 2,470.00 3,907.00  HGB 13.6 13.9  HCT 39* 40*  PLT 261 282   Lab Results  Component Value Date   TSH 2.15 12/07/2022   Lab Results  Component Value Date   HGBA1C 7.7 12/07/2022   Lab Results  Component Value Date   CHOL 212 (A) 09/11/2022   HDL 41 09/11/2022   LDLCALC 140 09/11/2022   LDLDIRECT 96.0 08/23/2020   TRIG 174 (A) 09/11/2022   CHOLHDL 4.4 10/21/2020    Significant Diagnostic Results in last 30 days:  DG Lumbar Spine  Complete  Result Date: 02/03/2023 CLINICAL DATA:  Recent fall. EXAM: LUMBAR SPINE - COMPLETE 4+ VIEW COMPARISON:  None Available. FINDINGS: Compression deformities are present at T12 and L1 with loss of vertebral body height of approximately 50% anteriorly. Scoliosis is noted. There is mild retrolisthesis at L3-L4 and L4-L5. Multilevel intervertebral disc space narrowing, degenerative endplate changes, and facet arthropathy. There is atherosclerotic calcification of the aorta. IMPRESSION: 1. Compression deformities at T12 and L1 with a proximally 50% loss of vertebral body height anteriorly, indeterminate in age. 2. Multilevel degenerative disc disease and facet arthropathy. Electronically Signed   By: Thornell Sartorius M.D.   On: 02/03/2023 21:32   DG Forearm Right  Result Date: 02/03/2023 CLINICAL DATA:  Right forearm pain.  Recent fall. EXAM: RIGHT FOREARM - 2 VIEW COMPARISON:  None Available. FINDINGS: There is no evidence of fracture or other focal bone lesions. Soft tissue swelling is present about the distal forearm. IMPRESSION: No acute fracture. Electronically Signed   By: Thornell Sartorius M.D.   On: 02/03/2023 21:28   CT Head Wo Contrast  Result Date: 02/03/2023 CLINICAL DATA:  Urinary incontinence and slurred speech since this morning. Fall on Thursday. On Plavix. EXAM: CT HEAD WITHOUT CONTRAST TECHNIQUE: Contiguous axial images were obtained from the base of the skull through the vertex without intravenous contrast. RADIATION DOSE REDUCTION: This exam was performed according to the departmental dose-optimization program which includes automated exposure control, adjustment of the mA and/or kV according to patient size and/or use of iterative reconstruction technique. COMPARISON:  None Available. FINDINGS: Images are degraded by streak artifact from the right cochlear implant. Brain: No intracranial hemorrhage, mass effect, or evidence of acute infarct. No hydrocephalus. No extra-axial fluid  collection. Age-commensurate cerebral atrophy and ill-defined hypoattenuation within the cerebral white matter consistent with chronic small vessel ischemic disease. Vascular: No hyperdense vessel. Intracranial arterial calcification. Skull: No fracture or focal lesion. Sinuses/Orbits: No acute finding. Paranasal sinuses and mastoid air cells are well aerated. Other: None. IMPRESSION: 1. No acute intracranial abnormality. Electronically Signed   By: Minerva Fester M.D.   On: 02/03/2023 20:55    Assessment/Plan 1. Frequent falls - 2 falls within past 2 months - 08/18 +UTI> resolved with Keflex - family rearranged room - blue mats at bedtime - 08/23 family agree to PT evaluation  2. Type 2 diabetes mellitus with stage 3a chronic kidney disease, without long-term current use of insulin (HCC) - A1c 7.7 (06/21) - eye exam 12/03/2022 - no hypoglycemic events - recent candidal skin infections - cont metformin  3. Nocturnal enuresis - ongoing - 08/18 +UTI> resolved with Keflex - h/o BPH - will not use urinal -  scheduled bathroom at midnight and 4 am   4. TIA (transient ischemic attack) - cont Plavix and fenofibrate - statin intolerance  5. Atherosclerosis of native coronary artery of native heart without angina pectoris - see above  6. Essential hypertension, benign - controlled with amlodipine   7. Memory deficit - BIMS 15/15 01/2023  8. Benign prostatic hyperplasia with urinary obstruction - cont finasteride  9. Hearing loss of right ear, unspecified hearing loss type - followed by Dr. Manson Passey - cochlear implant to right ear    Family/ staff Communication: plan discussed with patient and nurse  Labs/tests ordered:  A1c- future

## 2023-03-20 ENCOUNTER — Non-Acute Institutional Stay (SKILLED_NURSING_FACILITY): Payer: Federal, State, Local not specified - PPO | Admitting: Orthopedic Surgery

## 2023-03-20 ENCOUNTER — Encounter: Payer: Self-pay | Admitting: Orthopedic Surgery

## 2023-03-20 DIAGNOSIS — K5901 Slow transit constipation: Secondary | ICD-10-CM

## 2023-03-20 DIAGNOSIS — N3944 Nocturnal enuresis: Secondary | ICD-10-CM

## 2023-03-20 DIAGNOSIS — R296 Repeated falls: Secondary | ICD-10-CM

## 2023-03-20 MED ORDER — POLYETHYLENE GLYCOL 3350 17 GM/SCOOP PO POWD
17.0000 g | ORAL | Status: AC
Start: 2023-03-21 — End: ?

## 2023-03-20 NOTE — Progress Notes (Signed)
Location:  [facility] Room Number: 11/A Place of Service:  SNF (31) Provider:  [name], NP   [name], NP  Patient Care Team: [name], NP as PCP - General (Adult Health Nurse Practitioner) [name], MD as PCP - Cardiology (Cardiology)  Extended Emergency Contact Information Primary Emergency Contact: [name],[name]          [place], [place] Home Phone: [phone] Mobile Phone: [phone] Relation: Daughter Secondary Emergency Contact: [name],[name] Address: [address]          [place], [place] [address] [name] Home Phone: [phone] Mobile Phone: [phone] Relation: Daughter  Code Status:  DNR Goals of care: Advanced Directive information    02/22/2023   11:49 AM  Advanced Directives  Does Patient Have a Medical Advance Directive? Yes  Type of Estate agent of [place];Living will;Out of facility DNR (pink MOST or yellow form)  Does patient want to make changes to medical advance directive? No - Patient declined  Copy of Healthcare Power of Attorney in Chart? Yes - validated most recent copy scanned in chart (See row information)     Chief Complaint  Patient presents with   Acute Visit    Constipation, fall    HPI:  Pt is a 87 y.o. male seen today for acute visit due to constipation.   He currently resides on the skilled nursing unit at [facility]. PMH: CAD, s/p CABG 2000, HTN, TIA 2022, T2DM, HOH- cochlear implant, OA, BPH, CKD, memory deficit, shuffling gait, and weight loss.    He reports increased constipation x 2 days. He is not on bowel regimen. He denies abdominal pain, low back pain or N/V.   09/30 he was found by nursing on the floor. Clothing was soiled. No apparent injury. He is able to move extremities without difficulty. He continues to have episodes of not using call bell and getting up on his own. Recent UTI > 100,000 klebsiella  pneumoniae> resolved with Keflex. He has had ongoing nocturnal enuresis for months. Nursing has been scheduling bathroom visits at 12 am and 4 am. He denies dysuria, worsened frequency or low back pain. Afberile. Vitals stable.    Past Medical History:  Diagnosis Date   Cancer (HCC)    skin   CKD (chronic kidney disease) 06/24/2013   Cochlear implant in place    Coronary artery disease    a. s/p CABG 2000; b. myoview 9/08: inf-septal scar with mild peri-infarct ischemia (low risk);  c.  Echo 8/13: mild focal basal septal hypertrophy, Gr 1 DD, mild LAE;  d. Lexiscan Myoview (06/20/2013): No reversible ischemia, inferior and septal infarct, inferior and septal hypokinesis, EF 60%;  e.  Echo (06/20/2011): EF 50-55%, normal wall motion, grade 1 diastolic dysfunction, mild LAE   Diverticular disease    Duodenitis    Elevated PSA    GERD (gastroesophageal reflux disease)    Gout    Humerus fracture    right   Hydronephrosis with ureteropelvic junction obstruction    Hypercalcemia 02/02/2012   a. 01/2012: 10.7.   Hyperlipidemia    Hypertension    Pancreatitis    a. ? Simvastatin related   Pneumonia age 46 or 7   "I think only once" (06/19/2013)   Syncope    a. 06/2013   TIA (transient ischemic attack)    a. 01/2012 - presentation as acute imbalance;  b. 01/2012 carotid U/S w/o signif  stenosis;  c. 01/2012 Echo: EF 60-65%, Gr 1 DD.;  d.  Carotid US (06/20/2013): 1-39% bilateral ICA stenosis   Tubular adenoma polyp of rectum    Type II diabetes mellitus (HCC)    "diet and exercise controlled at present" (06/19/2013)   Past Surgical History:  Procedure Laterality Date   APPENDECTOMY     CARDIAC CATHETERIZATION  2000   CHOLECYSTECTOMY     COCHLEAR IMPLANT Right 1990's   COLONOSCOPY W/ POLYPECTOMY     no polyp 2012   CORONARY ARTERY BYPASS GRAFT  2000   CABG X5   INGUINAL HERNIA REPAIR Right 09/15/2015   Procedure: OPEN REPAIR RIGHT INGUINAL HERNIA ;  Surgeon: Avel Peace, MD;  Location: [facility];   Service: General;  Laterality: Right;   INSERTION OF MESH Right 09/15/2015   Procedure: INSERTION OF MESH;  Surgeon: Avel Peace, MD;  Location: [facility];  Service: General;  Laterality: Right;   PARATHYROIDECTOMY  1978   POLYPECTOMY     REVERSE SHOULDER ARTHROPLASTY Right 12/30/2014   Procedure: Right shoulder ORIF, Biomet S3 Plate and ZImmer cable system ;  Surgeon: Beverely Low, MD;  [facility];  Service: Orthopedics;  Laterality: Right;   right ankle surgery  age 61   TONSILLECTOMY  75   trachestomy as child for throat closing      Allergies  Allergen Reactions   Simvastatin Other (See Comments)    Possible statin-induced pancreatitis    Outpatient Encounter Medications as of 03/20/2023  Medication Sig   acetaminophen (TYLENOL) 500 MG tablet Take 1,000 mg by mouth 2 (two) times daily.   allopurinol (ZYLOPRIM) 100 MG tablet Take 100 mg by mouth daily.   amLODipine (NORVASC) 5 MG tablet Take 1 tablet (5 mg total) by mouth daily.   Choline Fenofibrate (FENOFIBRIC ACID) 135 MG CPDR Take 1 tablet by mouth in the morning.   clopidogrel (PLAVIX) 75 MG tablet Take 75 mg by mouth daily.   colchicine 0.6 MG tablet Take 0.6 mg by mouth daily as needed (as directed for gout flares).   ezetimibe (ZETIA) 10 MG tablet Take 10 mg by mouth daily.   finasteride (PROSCAR) 5 MG tablet Take 5 mg by mouth every evening.   ipratropium-albuterol (DUONEB) 0.5-2.5 (3) MG/3ML SOLN Take 3 mLs by nebulization every 6 (six) hours as needed.   metFORMIN (GLUCOPHAGE) 500 MG tablet Take 500 mg by mouth every morning.   metFORMIN (GLUCOPHAGE) 500 MG tablet Take 250 mg by mouth every evening.   miconazole (MICOTIN) 2 % powder Apply 1 Application topically daily at 12 noon.   zinc oxide 20 % ointment Apply 1 application  topically as needed for irritation. Apply transdermally as needed for skin irritation after each incontinent episode.   No facility-administered encounter medications on file as of  03/20/2023.    Review of Systems  Constitutional:  Negative for activity change and appetite change.  HENT:  Positive for hearing loss.   Eyes:  Negative for visual disturbance.  Respiratory:  Negative for cough, choking and wheezing.   Cardiovascular:  Negative for chest pain and leg swelling.  Gastrointestinal:  Positive for constipation. Negative for abdominal distention, abdominal pain, nausea and vomiting.  Genitourinary:  Positive for frequency. Negative for dysuria, hematuria and penile pain.  Musculoskeletal:  Positive for gait problem.  Skin:  Negative for wound.  Neurological:  Positive for weakness. Negative for dizziness and headaches.  Psychiatric/Behavioral:  Negative for confusion, dysphoric mood and sleep disturbance. The patient is not  nervous/anxious.     Immunization History  Administered Date(s) Administered   Fluad Quad(high Dose 65+) 04/01/2019, 04/11/2022   Influenza Split 04/13/2011, 05/02/2012   Influenza Whole 05/22/2007, 04/12/2008, 05/02/2009, 04/14/2010   Influenza, High Dose Seasonal PF 04/17/2013, 03/16/2014, 03/05/2016, 03/18/2017   Influenza,inj,Quad PF,6+ Mos 03/02/2015, 03/18/2018   Influenza-Unspecified 03/18/2018, 04/12/2021   Moderna Covid-19 Vaccine Bivalent Booster 33yrs & up 11/29/2021   Moderna SARS-COV2 Booster Vaccination 03/08/2021   Moderna Sars-Covid-2 Vaccination 06/22/2019, 07/20/2019, 05/02/2020, 11/23/2020   PPD Test 01/02/2015   Pneumococcal Conjugate-13 12/29/2015   Pneumococcal Polysaccharide-23 04/19/2006   Respiratory Syncytial Virus Vaccine,Recomb Aduvanted(Arexvy) 05/14/2022   Tdap 09/04/2016   Unspecified SARS-COV-2 Vaccination 04/24/2022   Zoster Recombinant(Shingrix) 09/17/2019, 03/29/2020   Zoster, Live 06/29/2015   Pertinent  Health Maintenance Due  Topic Date Due   INFLUENZA VACCINE  01/17/2023   HEMOGLOBIN A1C  06/08/2023   OPHTHALMOLOGY EXAM  12/03/2023   FOOT EXAM  01/25/2024      04/30/2022    2:09 PM  05/23/2022    9:44 AM 08/10/2022    3:12 PM 01/25/2023    1:55 PM 02/04/2023   11:25 AM  Fall Risk  Falls in the past year? 0 0 0 1 1  Was there an injury with Fall? 0 0 0 0 1  Fall Risk Category Calculator 0 0 0 1 3  Fall Risk Category (Retired) Low Low     (RETIRED) Patient Fall Risk Level Low fall risk Low fall risk     Patient at Risk for Falls Due to No Fall Risks No Fall Risks History of fall(s);Impaired balance/gait;Impaired mobility History of fall(s);Impaired balance/gait;Impaired mobility History of fall(s);Impaired balance/gait;Impaired mobility  Fall risk Follow up Falls evaluation completed Falls evaluation completed Falls evaluation completed;Education provided Falls evaluation completed;Education provided;Falls prevention discussed Falls evaluation completed;Education provided;Falls prevention discussed   Functional Status Survey:    Vitals:   03/20/23 1136  BP: (!) 141/81  Pulse: 83  Resp: 20  Temp: (!) 97 F (36.1 C)  SpO2: 93%  Weight: 164 lb 4.8 oz (74.5 kg)  Height: 5\' 9"  (1.753 m)   Body mass index is 24.26 kg/m. Physical Exam Vitals reviewed.  Constitutional:      General: He is not in acute distress. HENT:     Head: Normocephalic and atraumatic.     Ears:     Comments: Right cochlear implant Eyes:     General:        Right eye: No discharge.        Left eye: No discharge.  Cardiovascular:     Rate and Rhythm: Normal rate and regular rhythm.     Pulses: Normal pulses.     Heart sounds: Normal heart sounds.  Pulmonary:     Effort: Pulmonary effort is normal. No respiratory distress.     Breath sounds: Normal breath sounds. No wheezing or rales.  Abdominal:     General: There is no distension.     Palpations: Abdomen is soft. There is no mass.     Tenderness: There is no abdominal tenderness. There is no guarding or rebound.     Hernia: No hernia is present.     Comments: Hypoactive bowel sounds  Musculoskeletal:     Cervical back: Neck supple.      Right lower leg: No edema.     Left lower leg: No edema.     Comments: Moves extremities without difficulty  Skin:    General: Skin is warm.  Capillary Refill: Capillary refill takes less than 2 seconds.  Neurological:     General: No focal deficit present.     Mental Status: He is alert. Mental status is at baseline.     Motor: Weakness present.     Gait: Gait abnormal.     Comments: wheelchair  Psychiatric:        Mood and Affect: Mood normal.     Labs reviewed: Recent Labs    09/11/22 0000  NA 142  K 4.4  CL 107  CO2 26*  BUN 33*  CREATININE 1.4*  CALCIUM 10.7   Recent Labs    09/11/22 0000  AST 17  ALT 15  ALKPHOS 35  ALBUMIN 4.3   Recent Labs    09/11/22 0000 09/24/22 0000  WBC 4.6 6.5  NEUTROABS 2,470.00 3,907.00  HGB 13.6 13.9  HCT 39* 40*  PLT 261 282   Lab Results  Component Value Date   TSH 2.15 12/07/2022   Lab Results  Component Value Date   HGBA1C 7.7 12/07/2022   Lab Results  Component Value Date   CHOL 212 (A) 09/11/2022   HDL 41 09/11/2022   LDLCALC 140 09/11/2022   LDLDIRECT 96.0 08/23/2020   TRIG 174 (A) 09/11/2022   CHOLHDL 4.4 10/21/2020    Significant Diagnostic Results in last 30 days:  No results found.  Assessment/Plan 1. Slow transit constipation - appears to have daily movements per chart review - hypoactive bowel sounds - start miralax 2x/week> if diarrhea> recommend daily prn  2. Frequent falls - ongoing - 09/30 found on floor, tried to use bathroom on own - poor safety awareness> does not use call bell/ gets up on own - no apparent injury - family rearranged room last month - discussed falls safety - cont blue mats at night  3. Nocturnal enuresis - ongoing - scheduled bathroom visits at 12 am and 4 am  - will not use urinal  - UTI last month - does not appear to have increased frequency today - if behaviors continue> recommend recollection of UA/culture     Family/ staff Communication:  plan discussed with patient and nurse  Labs/tests ordered:  none

## 2023-03-21 LAB — HEMOGLOBIN A1C: Hemoglobin A1C: 7.4

## 2023-03-28 ENCOUNTER — Encounter: Payer: Self-pay | Admitting: Internal Medicine

## 2023-03-28 ENCOUNTER — Non-Acute Institutional Stay (SKILLED_NURSING_FACILITY): Payer: Federal, State, Local not specified - PPO | Admitting: Internal Medicine

## 2023-03-28 DIAGNOSIS — Z8739 Personal history of other diseases of the musculoskeletal system and connective tissue: Secondary | ICD-10-CM

## 2023-03-28 DIAGNOSIS — N401 Enlarged prostate with lower urinary tract symptoms: Secondary | ICD-10-CM

## 2023-03-28 DIAGNOSIS — N1831 Chronic kidney disease, stage 3a: Secondary | ICD-10-CM

## 2023-03-28 DIAGNOSIS — I10 Essential (primary) hypertension: Secondary | ICD-10-CM

## 2023-03-28 DIAGNOSIS — N138 Other obstructive and reflux uropathy: Secondary | ICD-10-CM

## 2023-03-28 DIAGNOSIS — E782 Mixed hyperlipidemia: Secondary | ICD-10-CM

## 2023-03-28 DIAGNOSIS — G459 Transient cerebral ischemic attack, unspecified: Secondary | ICD-10-CM | POA: Diagnosis not present

## 2023-03-28 DIAGNOSIS — E1122 Type 2 diabetes mellitus with diabetic chronic kidney disease: Secondary | ICD-10-CM | POA: Diagnosis not present

## 2023-03-28 NOTE — Progress Notes (Signed)
Location:  [facility] Room Number: 11A Place of Service:  SNF (31) Provider:  Vance Gather, NP  Patient Care Team: [name], NP as PCP - General (Adult Health Nurse Practitioner) [name], MD as PCP - Cardiology (Cardiology)  Extended Emergency Contact Information Primary Emergency Contact: [name],[name]          [place], [place] Home Phone: [phone] Mobile Phone: [phone] Relation: Daughter Secondary Emergency Contact: [name],[name] Address: [address]          [place], [place] [address] [name] Home Phone: [phone] Mobile Phone: [phone] Relation: Daughter  Code Status:  DNR Goals of care: Advanced Directive information    03/28/2023   11:12 AM  Advanced Directives  Does Patient Have a Medical Advance Directive? Yes  Type of Estate agent of [place];Living will;Out of facility DNR (pink MOST or yellow form)  Does patient want to make changes to medical advance directive? No - Guardian declined  Copy of Healthcare Power of Attorney in Chart? Yes - validated most recent copy scanned in chart (See row information)     Chief Complaint  Patient presents with   Medical Management of Chronic Issues    Patient is being seen for routine visit.   Immunizations    Patient is due for flu and covid vaccine     HPI:  Pt is a 87 y.o. male seen today for medical management of chronic diseases.   Lives in SNF   Patient has a history of CAD, S/P CABG in 2000, gout, HLD, hypertension and diabetes Unstable Gait No further work up per Neurology Has Auditory Implants  Had UTI in 8/24 Also Had Falls due to getting up at night Now on Schedule Bathroom time Still had Fall on 09/30   Had No acute issues today Wheelchair dependent No New Nursing issues Can do his transfers Wt Readings from Last 3 Encounters:  03/28/23 166 lb 12.8 oz (75.7 kg)  03/20/23  164 lb 4.8 oz (74.5 kg)  02/22/23 163 lb (73.9 kg)  Has lost weight from my last visit    Past Medical History:  Diagnosis Date   Cancer (HCC)    skin   CKD (chronic kidney disease) 06/24/2013   Cochlear implant in place    Coronary artery disease    a. s/p CABG 2000; b. myoview 9/08: inf-septal scar with mild peri-infarct ischemia (low risk);  c.  Echo 8/13: mild focal basal septal hypertrophy, Gr 1 DD, mild LAE;  d. Lexiscan Myoview (06/20/2013): No reversible ischemia, inferior and septal infarct, inferior and septal hypokinesis, EF 60%;  e.  Echo (06/20/2011): EF 50-55%, normal wall motion, grade 1 diastolic dysfunction, mild LAE   Diverticular disease    Duodenitis    Elevated PSA    GERD (gastroesophageal reflux disease)    Gout    Humerus fracture    right   Hydronephrosis with ureteropelvic junction obstruction    Hypercalcemia 02/02/2012   a. 01/2012: 10.7.   Hyperlipidemia    Hypertension    Pancreatitis    a. ? Simvastatin related   Pneumonia age 33 or 7   "I think only once" (06/19/2013)   Syncope    a. 06/2013   TIA (transient ischemic attack)    a. 01/2012 - presentation as acute imbalance;  b. 01/2012 carotid U/S w/o signif stenosis;  c. 01/2012 Echo: EF 60-65%, Gr 1 DD.;  d.  Carotid US (06/20/2013): 1-39% bilateral ICA stenosis   Tubular adenoma polyp of rectum    Type II diabetes mellitus (HCC)    "diet and exercise controlled at present" (06/19/2013)   Past Surgical History:  Procedure Laterality Date   APPENDECTOMY     CARDIAC CATHETERIZATION  2000   CHOLECYSTECTOMY     COCHLEAR IMPLANT Right 1990's   COLONOSCOPY W/ POLYPECTOMY     no polyp 2012   CORONARY ARTERY BYPASS GRAFT  2000   CABG X5   INGUINAL HERNIA REPAIR Right 09/15/2015   Procedure: OPEN REPAIR RIGHT INGUINAL HERNIA ;  Surgeon: Avel Peace, MD;  Location: [facility];  Service: General;  Laterality: Right;   INSERTION OF MESH Right 09/15/2015   Procedure: INSERTION OF MESH;  Surgeon: Avel Peace,  MD;  Location: [facility];  Service: General;  Laterality: Right;   PARATHYROIDECTOMY  1978   POLYPECTOMY     REVERSE SHOULDER ARTHROPLASTY Right 12/30/2014   Procedure: Right shoulder ORIF, Biomet S3 Plate and ZImmer cable system ;  Surgeon: Beverely Low, MD;  Location: [facility];  Service: Orthopedics;  Laterality: Right;   right ankle surgery  age 89   TONSILLECTOMY  89   trachestomy as child for throat closing      Allergies  Allergen Reactions   Simvastatin Other (See Comments)    Possible statin-induced pancreatitis    Outpatient Encounter Medications as of 03/28/2023  Medication Sig   acetaminophen (TYLENOL) 500 MG tablet Take 1,000 mg by mouth 2 (two) times daily.   allopurinol (ZYLOPRIM) 100 MG tablet Take 100 mg by mouth daily.   amLODipine (NORVASC) 5 MG tablet Take 1 tablet (5 mg total) by mouth daily.   clopidogrel (PLAVIX) 75 MG tablet Take 75 mg by mouth daily.   colchicine 0.6 MG tablet Take 0.6 mg by mouth daily as needed (as directed for gout flares).   ezetimibe (ZETIA) 10 MG tablet Take 10 mg by mouth daily.   finasteride (PROSCAR) 5 MG tablet Take 5 mg by mouth every evening.   ipratropium-albuterol (DUONEB) 0.5-2.5 (3) MG/3ML SOLN Take 3 mLs by nebulization every 6 (six) hours as needed.   metFORMIN (GLUCOPHAGE) 500 MG tablet Take 500 mg by mouth every morning.   metFORMIN (GLUCOPHAGE) 500 MG tablet Take 250 mg by mouth every evening.   miconazole (MICOTIN) 2 % powder Apply 1 Application topically daily at 12 noon.   polyethylene glycol powder (GLYCOLAX/MIRALAX) 17 GM/SCOOP powder Take 17 g by mouth 2 (two) times a week.   zinc oxide 20 % ointment Apply 1 application  topically as needed for irritation. Apply transdermally as needed for skin irritation after each incontinent episode.   Choline Fenofibrate (FENOFIBRIC ACID) 135 MG CPDR Take 1 tablet by mouth in the morning. (Patient not taking: Reported on 03/28/2023)   No facility-administered encounter medications on  file as of 03/28/2023.    Review of Systems  Constitutional:  Negative for activity change, appetite change and unexpected weight change.  HENT: Negative.    Respiratory:  Negative for cough and shortness of breath.   Cardiovascular:  Negative for leg swelling.  Gastrointestinal:  Negative for constipation.  Genitourinary:  Positive for frequency.  Musculoskeletal:  Positive for gait problem. Negative for arthralgias and myalgias.  Skin: Negative.  Negative for rash.  Neurological:  Negative for dizziness and weakness.  Psychiatric/Behavioral:  Negative for confusion and sleep disturbance.   All other systems reviewed and are negative.   Immunization History  Administered Date(s)  Administered   Fluad Quad(high Dose 65+) 04/01/2019, 04/11/2022   Influenza Split 04/13/2011, 05/02/2012   Influenza Whole 05/22/2007, 04/12/2008, 05/02/2009, 04/14/2010   Influenza, High Dose Seasonal PF 04/17/2013, 03/16/2014, 03/05/2016, 03/18/2017   Influenza,inj,Quad PF,6+ Mos 03/02/2015, 03/18/2018   Influenza-Unspecified 03/18/2018, 04/12/2021   Moderna Covid-19 Vaccine Bivalent Booster 26yrs & up 11/29/2021   Moderna SARS-COV2 Booster Vaccination 03/08/2021   Moderna Sars-Covid-2 Vaccination 06/22/2019, 07/20/2019, 05/02/2020, 11/23/2020   PPD Test 01/02/2015   Pneumococcal Conjugate-13 12/29/2015   Pneumococcal Polysaccharide-23 04/19/2006   Respiratory Syncytial Virus Vaccine,Recomb Aduvanted(Arexvy) 05/14/2022   Tdap 09/04/2016   Unspecified SARS-COV-2 Vaccination 04/24/2022   Zoster Recombinant(Shingrix) 09/17/2019, 03/29/2020   Zoster, Live 06/29/2015   Pertinent  Health Maintenance Due  Topic Date Due   INFLUENZA VACCINE  01/17/2023   HEMOGLOBIN A1C  06/08/2023   OPHTHALMOLOGY EXAM  12/03/2023   FOOT EXAM  01/25/2024      05/23/2022    9:44 AM 08/10/2022    3:12 PM 01/25/2023    1:55 PM 02/04/2023   11:25 AM 03/20/2023   12:07 PM  Fall Risk  Falls in the past year? 0 0 1 1 1   Was  there an injury with Fall? 0 0 0 1 1  Fall Risk Category Calculator 0 0 1 3 3   Fall Risk Category (Retired) Low      (RETIRED) Patient Fall Risk Level Low fall risk      Patient at Risk for Falls Due to No Fall Risks History of fall(s);Impaired balance/gait;Impaired mobility History of fall(s);Impaired balance/gait;Impaired mobility History of fall(s);Impaired balance/gait;Impaired mobility History of fall(s);Impaired balance/gait;Impaired mobility  Fall risk Follow up Falls evaluation completed Falls evaluation completed;Education provided Falls evaluation completed;Education provided;Falls prevention discussed Falls evaluation completed;Education provided;Falls prevention discussed Education provided;Falls prevention discussed   Functional Status Survey:    Vitals:   03/28/23 1010  BP: (!) 159/72  Pulse: 70  Resp: 20  Temp: (!) 97.4 F (36.3 C)  TempSrc: Temporal  SpO2: 95%  Weight: 166 lb 12.8 oz (75.7 kg)  Height: 5\' 9"  (1.753 m)   Body mass index is 24.63 kg/m. Physical Exam Vitals reviewed.  Constitutional:      Appearance: Normal appearance.  HENT:     Head: Normocephalic.     Nose: Nose normal.     Mouth/Throat:     Mouth: Mucous membranes are moist.     Pharynx: Oropharynx is clear.  Eyes:     Pupils: Pupils are equal, round, and reactive to light.  Cardiovascular:     Rate and Rhythm: Normal rate and regular rhythm.     Pulses: Normal pulses.     Heart sounds: No murmur heard. Pulmonary:     Effort: Pulmonary effort is normal. No respiratory distress.     Breath sounds: Normal breath sounds. No rales.  Abdominal:     General: Abdomen is flat. Bowel sounds are normal.     Palpations: Abdomen is soft.  Musculoskeletal:        General: No swelling.     Cervical back: Neck supple.  Skin:    General: Skin is warm.  Neurological:     General: No focal deficit present.     Mental Status: He is alert.  Psychiatric:        Mood and Affect: Mood normal.         Thought Content: Thought content normal.     Labs reviewed: Recent Labs    09/11/22 0000  NA 142  K 4.4  CL 107  CO2 26*  BUN 33*  CREATININE 1.4*  CALCIUM 10.7   Recent Labs    09/11/22 0000  AST 17  ALT 15  ALKPHOS 35  ALBUMIN 4.3   Recent Labs    09/11/22 0000 09/24/22 0000  WBC 4.6 6.5  NEUTROABS 2,470.00 3,907.00  HGB 13.6 13.9  HCT 39* 40*  PLT 261 282   Lab Results  Component Value Date   TSH 2.15 12/07/2022   Lab Results  Component Value Date   HGBA1C 7.7 12/07/2022   Lab Results  Component Value Date   CHOL 212 (A) 09/11/2022   HDL 41 09/11/2022   LDLCALC 140 09/11/2022   LDLDIRECT 96.0 08/23/2020   TRIG 174 (A) 09/11/2022   CHOLHDL 4.4 10/21/2020    Significant Diagnostic Results in last 30 days:  No results found.  Assessment/Plan 1. Type 2 diabetes mellitus with stage 3a chronic kidney disease, without long-term current use of insulin (HCC) A1C 7.4 in 10/24 Metformin 2. TIA (transient ischemic attack) Plavix and Zetia Allergic to statin  3. Essential hypertension, benign Norvasc  4. Benign prostatic hyperplasia with urinary obstruction On Proscar Will benefit with Flomax Will Discuss with POA  5. Mixed hyperlipidemia Plavix and Zetia Allergic to statin  6. Stage 3a chronic kidney disease (HCC) Creat stable  7. History of gout Colchicine    Family/ staff Communication:   Labs/tests ordered:

## 2023-04-11 ENCOUNTER — Encounter: Payer: Self-pay | Admitting: Internal Medicine

## 2023-04-29 ENCOUNTER — Encounter: Payer: Self-pay | Admitting: Orthopedic Surgery

## 2023-04-29 ENCOUNTER — Non-Acute Institutional Stay (SKILLED_NURSING_FACILITY): Payer: Federal, State, Local not specified - PPO | Admitting: Orthopedic Surgery

## 2023-04-29 DIAGNOSIS — I2510 Atherosclerotic heart disease of native coronary artery without angina pectoris: Secondary | ICD-10-CM

## 2023-04-29 DIAGNOSIS — N3944 Nocturnal enuresis: Secondary | ICD-10-CM

## 2023-04-29 DIAGNOSIS — E1122 Type 2 diabetes mellitus with diabetic chronic kidney disease: Secondary | ICD-10-CM

## 2023-04-29 DIAGNOSIS — Z8673 Personal history of transient ischemic attack (TIA), and cerebral infarction without residual deficits: Secondary | ICD-10-CM

## 2023-04-29 DIAGNOSIS — I10 Essential (primary) hypertension: Secondary | ICD-10-CM

## 2023-04-29 DIAGNOSIS — N1831 Chronic kidney disease, stage 3a: Secondary | ICD-10-CM

## 2023-04-29 DIAGNOSIS — R413 Other amnesia: Secondary | ICD-10-CM

## 2023-04-29 DIAGNOSIS — H905 Unspecified sensorineural hearing loss: Secondary | ICD-10-CM

## 2023-04-29 NOTE — Progress Notes (Signed)
Location:   [facility] Home Room Number: 11-A Place of Service:  SNF (31) Provider:  [name], NP    Patient Care Team: [name], NP as PCP - General (Adult Health Nurse Practitioner) [name], MD as PCP - Cardiology (Cardiology)  Extended Emergency Contact Information Primary Emergency Contact: [name],[name]          [place], [place] Home Phone: [phone] Mobile Phone: [phone] Relation: Daughter Secondary Emergency Contact: [name],[name] Address: [address]          [place], [place] [address] [name] Home Phone: [phone] Mobile Phone: [phone] Relation: Daughter  Code Status:  DNR Goals of care: Advanced Directive information    04/29/2023    9:24 AM  Advanced Directives  Does Patient Have a Medical Advance Directive? Yes  Type of Estate agent of [place];Living will;Out of facility DNR (pink MOST or yellow form)  Does patient want to make changes to medical advance directive? No - Patient declined  Copy of Healthcare Power of Attorney in Chart? Yes - validated most recent copy scanned in chart (See row information)     Chief Complaint  Patient presents with   Medical Management of Chronic Issues    Routine Visit.    Immunizations    Discuss the need for [employer].    Health Maintenance    Discuss the need for Urine Microalbumin.     HPI:  Pt is a 87 y.o. male seen today for medical management of chronic diseases.    He currently resides on the skilled nursing unit at [facility]. PMH: CAD, s/p CABG 2000, HTN, TIA 2022, T2DM, HOH- cochlear implant, OA, BPH, CKD, memory deficit, shuffling gait, and weight loss.     T2DM- A1c 7.4 (10/03)> was 7.7 (06/21), blood sugars 150-170's, no hypoglycemias, urine microalbumin 31 04/27/2022, eye exam 12/03/2022, regular diet, remains on metformin Nocturnal enuresis- ongoing incontinence, improved with scheduled  toileting at midnight and 4 am HxTIA- no further workup per neurology, remains on plavix and fenofibrate> allergy to statin CAD- H/o CABG, followed by cardiology, 10/2020 LVEF 60-65%, remains on plavix HTN- BUN/creat 33/1.4 09/11/2022, remains on amlodipine Memory deficit- BIMS score 12/15 (11/07)> was 15/15 (08/05), CT head 12/2020 atrophy and chronic microvascular ischemic changes noted, no behaviors BPH- remains on finasteride Sensory hearing loss- followed by Dr. Manson Passey, right cochlear implant in place   11/14 scheduled cataract (right) surgery with [facility]. Plavix on hold at this time.   Recent blood pressures:  11/05- 153/90   11/03- 133/78, 135/70  11/02- 141/85, 118/72  Recent weights:  11/11- 164.5 lbs  11/01- 166.8 lbs  10/14- 168.6 lbs  10/02- 167.3 lbs   Past Medical History:  Diagnosis Date   Cancer (HCC)    skin   CKD (chronic kidney disease) 06/24/2013   Cochlear implant in place    Coronary artery disease    a. s/p CABG 2000; b. myoview 9/08: inf-septal scar with mild peri-infarct ischemia (low risk);  c.  Echo 8/13: mild focal basal septal hypertrophy, Gr 1 DD, mild LAE;  d. Lexiscan Myoview (06/20/2013): No reversible ischemia, inferior and septal infarct, inferior and septal hypokinesis, EF 60%;  e.  Echo (06/20/2011): EF 50-55%, normal wall motion, grade 1 diastolic dysfunction, mild LAE   Diverticular disease    Duodenitis    Elevated PSA    GERD (gastroesophageal reflux disease)    Gout    Humerus  fracture    right   Hydronephrosis with ureteropelvic junction obstruction    Hypercalcemia 02/02/2012   a. 01/2012: 10.7.   Hyperlipidemia    Hypertension    Pancreatitis    a. ? Simvastatin related   Pneumonia age 67 or 7   "I think only once" (06/19/2013)   Syncope    a. 06/2013   TIA (transient ischemic attack)    a. 01/2012 - presentation as acute imbalance;  b. 01/2012 carotid U/S w/o signif stenosis;  c. 01/2012 Echo: EF 60-65%, Gr 1 DD.;  d.   Carotid US (06/20/2013): 1-39% bilateral ICA stenosis   Tubular adenoma polyp of rectum    Type II diabetes mellitus (HCC)    "diet and exercise controlled at present" (06/19/2013)   Past Surgical History:  Procedure Laterality Date   APPENDECTOMY     CARDIAC CATHETERIZATION  2000   CHOLECYSTECTOMY     COCHLEAR IMPLANT Right 1990's   COLONOSCOPY W/ POLYPECTOMY     no polyp 2012   CORONARY ARTERY BYPASS GRAFT  2000   CABG X5   INGUINAL HERNIA REPAIR Right 09/15/2015   Procedure: OPEN REPAIR RIGHT INGUINAL HERNIA ;  Surgeon: Avel Peace, MD;  Location: [facility];  Service: General;  Laterality: Right;   INSERTION OF MESH Right 09/15/2015   Procedure: INSERTION OF MESH;  Surgeon: Avel Peace, MD;  Location: [facility];  Service: General;  Laterality: Right;   PARATHYROIDECTOMY  1978   POLYPECTOMY     REVERSE SHOULDER ARTHROPLASTY Right 12/30/2014   Procedure: Right shoulder ORIF, Biomet S3 Plate and ZImmer cable system ;  Surgeon: Beverely Low, MD;  Location: [facility] OR;  Service: Orthopedics;  Laterality: Right;   right ankle surgery  age 49   TONSILLECTOMY  61   trachestomy as child for throat closing      Allergies  Allergen Reactions   Simvastatin Other (See Comments)    Possible statin-induced pancreatitis    Allergies as of 04/29/2023       Reactions   Simvastatin Other (See Comments)   Possible statin-induced pancreatitis        Medication List        Accurate as of April 29, 2023  9:26 AM. If you have any questions, ask your nurse or doctor.          acetaminophen 500 MG tablet Commonly known as: TYLENOL Take 1,000 mg by mouth 2 (two) times daily.   allopurinol 100 MG tablet Commonly known as: ZYLOPRIM Take 100 mg by mouth daily.   amLODipine 5 MG tablet Commonly known as: NORVASC Take 1 tablet (5 mg total) by mouth daily.   clopidogrel 75 MG tablet Commonly known as: PLAVIX Take 75 mg by mouth daily.   colchicine 0.6 MG tablet Take 0.6 mg by mouth  daily as needed (as directed for gout flares).   ezetimibe 10 MG tablet Commonly known as: ZETIA Take 10 mg by mouth daily.   Fenofibric Acid 135 MG Cpdr Take 1 tablet by mouth in the morning.   finasteride 5 MG tablet Commonly known as: PROSCAR Take 5 mg by mouth every evening.   guaiFENesin-dextromethorphan 100-10 MG/5ML syrup Commonly known as: ROBITUSSIN DM Take 10 mLs by mouth every 6 (six) hours as needed for cough.   ipratropium-albuterol 0.5-2.5 (3) MG/3ML Soln Commonly known as: DUONEB Take 3 mLs by nebulization every 6 (six) hours as needed.   metFORMIN 500 MG tablet Commonly known as: GLUCOPHAGE Take 500 mg by mouth every  morning.   metFORMIN 500 MG tablet Commonly known as: GLUCOPHAGE Take 250 mg by mouth every evening.   miconazole 2 % powder Commonly known as: MICOTIN Apply 1 Application topically daily at 12 noon.   polyethylene glycol powder 17 GM/SCOOP powder Commonly known as: GLYCOLAX/MIRALAX Take 17 g by mouth 2 (two) times a week.   tamsulosin 0.4 MG Caps capsule Commonly known as: FLOMAX Take 0.4 mg by mouth at bedtime.   zinc oxide 20 % ointment Apply 1 application  topically as needed for irritation. Apply transdermally as needed for skin irritation after each incontinent episode.        Review of Systems  Constitutional:  Negative for activity change and appetite change.  HENT:  Positive for hearing loss. Negative for sore throat and trouble swallowing.   Eyes:  Negative for visual disturbance.  Respiratory:  Negative for cough, shortness of breath and wheezing.   Cardiovascular:  Negative for chest pain and leg swelling.  Gastrointestinal:  Negative for abdominal distention and abdominal pain.  Genitourinary:  Negative for dysuria, frequency and hematuria.  Musculoskeletal:  Positive for gait problem.  Skin:  Negative for wound.  Neurological:  Positive for weakness. Negative for dizziness and headaches.  Psychiatric/Behavioral:   Positive for confusion. Negative for dysphoric mood and sleep disturbance. The patient is not nervous/anxious.     Immunization History  Administered Date(s) Administered   Fluad Quad(high Dose 65+) 04/01/2019, 04/11/2022   Influenza Split 04/13/2011, 05/02/2012   Influenza Whole 05/22/2007, 04/12/2008, 05/02/2009, 04/14/2010   Influenza, High Dose Seasonal PF 04/17/2013, 03/16/2014, 03/05/2016, 03/18/2017, 04/17/2023   Influenza,inj,Quad PF,6+ Mos 03/02/2015, 03/18/2018   Influenza-Unspecified 03/18/2018, 04/12/2021   Moderna Covid-19 Vaccine Bivalent Booster 68yrs & up 11/29/2021   Moderna SARS-COV2 Booster Vaccination 03/08/2021   Moderna Sars-Covid-2 Vaccination 06/22/2019, 07/20/2019, 05/02/2020, 11/23/2020   PPD Test 01/02/2015   Pneumococcal Conjugate-13 12/29/2015   Pneumococcal Polysaccharide-23 04/19/2006   Respiratory Syncytial Virus Vaccine,Recomb Aduvanted(Arexvy) 05/14/2022   Tdap 09/04/2016   Unspecified SARS-COV-2 Vaccination 04/24/2022   Zoster Recombinant(Shingrix) 09/17/2019, 03/29/2020   Zoster, Live 06/29/2015   Pertinent  Health Maintenance Due  Topic Date Due   HEMOGLOBIN A1C  09/19/2023   OPHTHALMOLOGY EXAM  12/03/2023   FOOT EXAM  01/25/2024   INFLUENZA VACCINE  Completed      05/23/2022    9:44 AM 08/10/2022    3:12 PM 01/25/2023    1:55 PM 02/04/2023   11:25 AM 03/20/2023   12:07 PM  Fall Risk  Falls in the past year? 0 0 1 1 1   Was there an injury with Fall? 0 0 0 1 1  Fall Risk Category Calculator 0 0 1 3 3   Fall Risk Category (Retired) Low      (RETIRED) Patient Fall Risk Level Low fall risk      Patient at Risk for Falls Due to No Fall Risks History of fall(s);Impaired balance/gait;Impaired mobility History of fall(s);Impaired balance/gait;Impaired mobility History of fall(s);Impaired balance/gait;Impaired mobility History of fall(s);Impaired balance/gait;Impaired mobility  Fall risk Follow up Falls evaluation completed Falls evaluation  completed;Education provided Falls evaluation completed;Education provided;Falls prevention discussed Falls evaluation completed;Education provided;Falls prevention discussed Education provided;Falls prevention discussed   Functional Status Survey:    Vitals:   04/29/23 0918  BP: (!) 153/90  Pulse: 100  Resp: 18  Temp: (!) 97.3 F (36.3 C)  SpO2: 91%  Weight: 164 lb 8 oz (74.6 kg)  Height: 5\' 9"  (1.753 m)   Body mass index is 24.29 kg/m. Physical Exam  Vitals reviewed.  Constitutional:      General: He is not in acute distress. HENT:     Head: Normocephalic.     Right Ear: There is no impacted cerumen.     Left Ear: There is no impacted cerumen.     Ears:     Comments: Right cochlear implant    Nose: Nose normal.     Mouth/Throat:     Mouth: Mucous membranes are moist.  Eyes:     General:        Right eye: No discharge.        Left eye: No discharge.  Neck:     Vascular: No carotid bruit.  Cardiovascular:     Rate and Rhythm: Normal rate and regular rhythm.     Pulses: Normal pulses.     Heart sounds: Normal heart sounds.  Pulmonary:     Effort: Pulmonary effort is normal.     Breath sounds: Normal breath sounds.  Abdominal:     General: Bowel sounds are normal.     Palpations: Abdomen is soft.  Musculoskeletal:     Cervical back: Neck supple.     Right lower leg: No edema.     Left lower leg: No edema.  Lymphadenopathy:     Cervical: No cervical adenopathy.  Skin:    General: Skin is warm.     Capillary Refill: Capillary refill takes less than 2 seconds.  Neurological:     General: No focal deficit present.     Mental Status: He is alert. Mental status is at baseline.     Motor: Weakness present.     Gait: Gait abnormal.     Comments: wheelchair  Psychiatric:        Mood and Affect: Mood normal.     Labs reviewed: Recent Labs    09/11/22 0000  NA 142  K 4.4  CL 107  CO2 26*  BUN 33*  CREATININE 1.4*  CALCIUM 10.7   Recent Labs     09/11/22 0000  AST 17  ALT 15  ALKPHOS 35  ALBUMIN 4.3   Recent Labs    09/11/22 0000 09/24/22 0000  WBC 4.6 6.5  NEUTROABS 2,470.00 3,907.00  HGB 13.6 13.9  HCT 39* 40*  PLT 261 282   Lab Results  Component Value Date   TSH 2.15 12/07/2022   Lab Results  Component Value Date   HGBA1C 7.4 03/21/2023   Lab Results  Component Value Date   CHOL 212 (A) 09/11/2022   HDL 41 09/11/2022   LDLCALC 140 09/11/2022   LDLDIRECT 96.0 08/23/2020   TRIG 174 (A) 09/11/2022   CHOLHDL 4.4 10/21/2020    Significant Diagnostic Results in last 30 days:  No results found.  Assessment/Plan 1. Type 2 diabetes mellitus with stage 3a chronic kidney disease, without long-term current use of insulin (HCC) - recent A1c 7.4 - no hypoglycemias - yearly urine microalbumin due - eye exam 11/2022 - remains on regular diet - cont metformin  2. Nocturnal enuresis - ongoing - cont scheduled toileting times - cont Flomax and Finasteride  3. History of TIA (transient ischemic attack) - cont plavix and statin  4. Atherosclerosis of native coronary artery of native heart without angina pectoris - cont plavix and statin  5. Essential hypertension, benign - controlled, goal < 150/90 - cont amlodipine  6. Memory deficit - recent BIMS 12/15> was 15/15 - no behaviors - dependent with some ADLs  - cont skilled nursing  7. Sensory hearing loss - followed by Dr. Manson Passey - right cochlear implant     Family/ staff Communication: plans discussed with patient and nurse  Labs/tests ordered:  cbc/diff, cmp, urine microalbumin 11/18

## 2023-05-06 LAB — CBC AND DIFFERENTIAL
HCT: 37 — AB (ref 41–53)
Hemoglobin: 12.2 — AB (ref 13.5–17.5)
Neutrophils Absolute: 3340
WBC: 5.7

## 2023-05-06 LAB — BASIC METABOLIC PANEL
BUN: 34 — AB (ref 4–21)
CO2: 26 — AB (ref 13–22)
Chloride: 107 (ref 99–108)
Creatinine: 1.2 (ref 0.6–1.3)
Glucose: 139
Potassium: 4.1 meq/L (ref 3.5–5.1)
Sodium: 140 (ref 137–147)

## 2023-05-06 LAB — PROTEIN / CREATININE RATIO, URINE
Albumin, U: 14.5
Creatinine, Urine: 65

## 2023-05-06 LAB — CBC: RBC: 4.18 (ref 3.87–5.11)

## 2023-05-06 LAB — HEPATIC FUNCTION PANEL
ALT: 12 U/L (ref 10–40)
AST: 16 (ref 14–40)
Alkaline Phosphatase: 35 (ref 25–125)
Bilirubin, Total: 0.4

## 2023-05-06 LAB — COMPREHENSIVE METABOLIC PANEL
Albumin: 3.9 (ref 3.5–5.0)
Calcium: 10.6 (ref 8.7–10.7)
Globulin: 2.6

## 2023-05-06 LAB — MICROALBUMIN / CREATININE URINE RATIO: Microalb Creat Ratio: 223

## 2023-05-07 ENCOUNTER — Non-Acute Institutional Stay (SKILLED_NURSING_FACILITY): Payer: Self-pay | Admitting: Orthopedic Surgery

## 2023-05-07 ENCOUNTER — Encounter: Payer: Self-pay | Admitting: Orthopedic Surgery

## 2023-05-07 DIAGNOSIS — E1122 Type 2 diabetes mellitus with diabetic chronic kidney disease: Secondary | ICD-10-CM

## 2023-05-07 DIAGNOSIS — N1831 Chronic kidney disease, stage 3a: Secondary | ICD-10-CM

## 2023-05-07 DIAGNOSIS — N3944 Nocturnal enuresis: Secondary | ICD-10-CM | POA: Diagnosis not present

## 2023-05-07 DIAGNOSIS — R809 Proteinuria, unspecified: Secondary | ICD-10-CM

## 2023-05-07 MED ORDER — LISINOPRIL 5 MG PO TABS: 5.0000 mg | ORAL_TABLET | Freq: Every day | ORAL | Status: DC

## 2023-05-07 NOTE — Progress Notes (Signed)
Location:  [facility] Room Number: 11/A Place of Service:  SNF (31) Provider:  [name], NP   [name], NP  Patient Care Team: [name], NP as PCP - General (Adult Health Nurse Practitioner) [name], MD as PCP - Cardiology (Cardiology)  Extended Emergency Contact Information Primary Emergency Contact: [name],[name]          [place], [place] Home Phone: [phone] Mobile Phone: [phone] Relation: Daughter Secondary Emergency Contact: [name],[name] Address: [address]          [place], [place] [address] [name] Home Phone: [phone] Mobile Phone: [phone] Relation: Daughter  Code Status:  DNR Goals of care: Advanced Directive information    04/29/2023    9:24 AM  Advanced Directives  Does Patient Have a Medical Advance Directive? Yes  Type of Estate agent of [place];Living will;Out of facility DNR (pink MOST or yellow form)  Does patient want to make changes to medical advance directive? No - Patient declined  Copy of Healthcare Power of Attorney in Chart? Yes - validated most recent copy scanned in chart (See row information)     Chief Complaint  Patient presents with   Acute Visit    Abnormal urine microalbumin    HPI:  Pt is a 87 y.o. male seen today for acute visit due to abnormal urine microalbumin.   He currently resides on the skilled nursing unit at [facility]. PMH: CAD, s/p CABG 2000, HTN, TIA 2022, T2DM, HOH- cochlear implant, OA, BPH, CKD, memory deficit, shuffling gait, and weight loss.    T2DM- A1c 7.4 (10/03)> was 7.7 (06/21), blood sugars 150-170's, no hypoglycemias, urine microalbumin 31 04/27/2022> now 223 05/06/2023, eye exam 12/03/2022, regular diet, remains on metformin  Nocturnal enuresis- ongoing incontinence, improved with scheduled toileting at midnight and 4 am, Flomax started 10/24 per Dr. Chales Abrahams    Past Medical History:   Diagnosis Date   Cancer [facility])    skin   CKD (chronic kidney disease) 06/24/2013   Cochlear implant in place    Coronary artery disease    a. s/p CABG 2000; b. myoview 9/08: inf-septal scar with mild peri-infarct ischemia (low risk);  c.  Echo 8/13: mild focal basal septal hypertrophy, Gr 1 DD, mild LAE;  d. Lexiscan Myoview (06/20/2013): No reversible ischemia, inferior and septal infarct, inferior and septal hypokinesis, EF 60%;  e.  Echo (06/20/2011): EF 50-55%, normal wall motion, grade 1 diastolic dysfunction, mild LAE   Diverticular disease    Duodenitis    Elevated PSA    GERD (gastroesophageal reflux disease)    Gout    Humerus fracture    right   Hydronephrosis with ureteropelvic junction obstruction    Hypercalcemia 02/02/2012   a. 01/2012: 10.7.   Hyperlipidemia    Hypertension    Pancreatitis    a. ? Simvastatin related   Pneumonia age 89 or 7   "I think only once" (06/19/2013)   Syncope    a. 06/2013   TIA (transient ischemic attack)    a. 01/2012 - presentation as acute imbalance;  b. 01/2012 carotid U/S w/o signif stenosis;  c. 01/2012 Echo: EF 60-65%, Gr 1 DD.;  d.  Carotid US (06/20/2013): 1-39% bilateral ICA stenosis   Tubular adenoma polyp of rectum    Type II diabetes mellitus (HCC)    "diet and exercise controlled at present" (06/19/2013)   Past Surgical History:  Procedure Laterality  Date   APPENDECTOMY     CARDIAC CATHETERIZATION  2000   CHOLECYSTECTOMY     COCHLEAR IMPLANT Right 1990's   COLONOSCOPY W/ POLYPECTOMY     no polyp 2012   CORONARY ARTERY BYPASS GRAFT  2000   CABG X5   INGUINAL HERNIA REPAIR Right 09/15/2015   Procedure: OPEN REPAIR RIGHT INGUINAL HERNIA ;  Surgeon: Avel Peace, MD;  Location: [facility];  Service: General;  Laterality: Right;   INSERTION OF MESH Right 09/15/2015   Procedure: INSERTION OF MESH;  Surgeon: Avel Peace, MD;  Location: [facility];  Service: General;  Laterality: Right;   PARATHYROIDECTOMY  1978   POLYPECTOMY     REVERSE  SHOULDER ARTHROPLASTY Right 12/30/2014   Procedure: Right shoulder ORIF, Biomet S3 Plate and ZImmer cable system ;  Surgeon: Beverely Low, MD;  Location: [facility] OR;  Service: Orthopedics;  Laterality: Right;   right ankle surgery  age 20   TONSILLECTOMY  32   trachestomy as child for throat closing      Allergies  Allergen Reactions   Simvastatin Other (See Comments)    Possible statin-induced pancreatitis    Outpatient Encounter Medications as of 05/07/2023  Medication Sig   acetaminophen (TYLENOL) 500 MG tablet Take 1,000 mg by mouth 2 (two) times daily.   allopurinol (ZYLOPRIM) 100 MG tablet Take 100 mg by mouth daily.   amLODipine (NORVASC) 5 MG tablet Take 1 tablet (5 mg total) by mouth daily.   Choline Fenofibrate (FENOFIBRIC ACID) 135 MG CPDR Take 1 tablet by mouth in the morning.   clopidogrel (PLAVIX) 75 MG tablet Take 75 mg by mouth daily.   colchicine 0.6 MG tablet Take 0.6 mg by mouth daily as needed (as directed for gout flares).   ezetimibe (ZETIA) 10 MG tablet Take 10 mg by mouth daily.   finasteride (PROSCAR) 5 MG tablet Take 5 mg by mouth every evening.   guaiFENesin-dextromethorphan (ROBITUSSIN DM) 100-10 MG/5ML syrup Take 10 mLs by mouth every 6 (six) hours as needed for cough.   ipratropium-albuterol (DUONEB) 0.5-2.5 (3) MG/3ML SOLN Take 3 mLs by nebulization every 6 (six) hours as needed.   metFORMIN (GLUCOPHAGE) 500 MG tablet Take 500 mg by mouth every morning.   metFORMIN (GLUCOPHAGE) 500 MG tablet Take 250 mg by mouth every evening.   miconazole (MICOTIN) 2 % powder Apply 1 Application topically daily at 12 noon.   polyethylene glycol powder (GLYCOLAX/MIRALAX) 17 GM/SCOOP powder Take 17 g by mouth 2 (two) times a week.   tamsulosin (FLOMAX) 0.4 MG CAPS capsule Take 0.4 mg by mouth at bedtime.   zinc oxide 20 % ointment Apply 1 application  topically as needed for irritation. Apply transdermally as needed for skin irritation after each incontinent episode.   No  facility-administered encounter medications on file as of 05/07/2023.    Review of Systems  Constitutional:  Negative for activity change and appetite change.  Respiratory:  Negative for cough.   Cardiovascular:  Negative for chest pain.  Endocrine: Negative for polydipsia, polyphagia and polyuria.  Genitourinary:        Nocturia  Musculoskeletal:  Positive for gait problem.  Psychiatric/Behavioral:  Negative for dysphoric mood. The patient is not nervous/anxious.     Immunization History  Administered Date(s) Administered   Fluad Quad(high Dose 65+) 04/01/2019, 04/11/2022   Influenza Split 04/13/2011, 05/02/2012   Influenza Whole 05/22/2007, 04/12/2008, 05/02/2009, 04/14/2010   Influenza, High Dose Seasonal PF 04/17/2013, 03/16/2014, 03/05/2016, 03/18/2017, 04/17/2023   Influenza,inj,Quad PF,6+ Mos  03/02/2015, 03/18/2018   Influenza-Unspecified 03/18/2018, 04/12/2021   Moderna Covid-19 Vaccine Bivalent Booster 70yrs & up 11/29/2021   Moderna SARS-COV2 Booster Vaccination 03/08/2021   Moderna Sars-Covid-2 Vaccination 06/22/2019, 07/20/2019, 05/02/2020, 11/23/2020   PPD Test 01/02/2015   Pneumococcal Conjugate-13 12/29/2015   Pneumococcal Polysaccharide-23 04/19/2006   Respiratory Syncytial Virus Vaccine,Recomb Aduvanted(Arexvy) 05/14/2022   Tdap 09/04/2016   Unspecified SARS-COV-2 Vaccination 04/24/2022   Zoster Recombinant(Shingrix) 09/17/2019, 03/29/2020   Zoster, Live 06/29/2015   Pertinent  Health Maintenance Due  Topic Date Due   HEMOGLOBIN A1C  09/19/2023   OPHTHALMOLOGY EXAM  12/03/2023   FOOT EXAM  01/25/2024   INFLUENZA VACCINE  Completed      05/23/2022    9:44 AM 08/10/2022    3:12 PM 01/25/2023    1:55 PM 02/04/2023   11:25 AM 03/20/2023   12:07 PM  Fall Risk  Falls in the past year? 0 0 1 1 1   Was there an injury with Fall? 0 0 0 1 1  Fall Risk Category Calculator 0 0 1 3 3   Fall Risk Category (Retired) Low      (RETIRED) Patient Fall Risk Level Low fall  risk      Patient at Risk for Falls Due to No Fall Risks History of fall(s);Impaired balance/gait;Impaired mobility History of fall(s);Impaired balance/gait;Impaired mobility History of fall(s);Impaired balance/gait;Impaired mobility History of fall(s);Impaired balance/gait;Impaired mobility  Fall risk Follow up Falls evaluation completed Falls evaluation completed;Education provided Falls evaluation completed;Education provided;Falls prevention discussed Falls evaluation completed;Education provided;Falls prevention discussed Education provided;Falls prevention discussed   Functional Status Survey:    Vitals:   05/07/23 1526  BP: (!) 173/89  Pulse: 94  Resp: 20  Temp: (!) 97 F (36.1 C)  SpO2: 95%  Weight: 167 lb 3.2 oz (75.8 kg)  Height: 5\' 9"  (1.753 m)   Body mass index is 24.69 kg/m. Physical Exam Vitals reviewed.  Constitutional:      General: He is not in acute distress. HENT:     Head: Normocephalic.  Eyes:     General:        Right eye: No discharge.        Left eye: No discharge.  Cardiovascular:     Rate and Rhythm: Normal rate and regular rhythm.     Pulses: Normal pulses.     Heart sounds: Normal heart sounds.  Pulmonary:     Effort: Pulmonary effort is normal.     Breath sounds: Normal breath sounds.  Abdominal:     General: Bowel sounds are normal.     Palpations: Abdomen is soft.  Musculoskeletal:     Cervical back: Neck supple.  Skin:    General: Skin is warm.     Capillary Refill: Capillary refill takes less than 2 seconds.  Neurological:     General: No focal deficit present.     Mental Status: He is alert and oriented to person, place, and time.     Motor: Weakness present.     Gait: Gait abnormal.  Psychiatric:        Mood and Affect: Mood normal.     Labs reviewed: Recent Labs    09/11/22 0000  NA 142  K 4.4  CL 107  CO2 26*  BUN 33*  CREATININE 1.4*  CALCIUM 10.7   Recent Labs    09/11/22 0000  AST 17  ALT 15  ALKPHOS 35   ALBUMIN 4.3   Recent Labs    09/11/22 0000 09/24/22 0000  WBC 4.6  6.5  NEUTROABS 2,470.00 3,907.00  HGB 13.6 13.9  HCT 39* 40*  PLT 261 282   Lab Results  Component Value Date   TSH 2.15 12/07/2022   Lab Results  Component Value Date   HGBA1C 7.4 03/21/2023   Lab Results  Component Value Date   CHOL 212 (A) 09/11/2022   HDL 41 09/11/2022   LDLCALC 140 09/11/2022   LDLDIRECT 96.0 08/23/2020   TRIG 174 (A) 09/11/2022   CHOLHDL 4.4 10/21/2020    Significant Diagnostic Results in last 30 days:  No results found.  Assessment/Plan 1. Type 2 diabetes mellitus with stage 3a chronic kidney disease, without long-term current use of insulin (HCC) - recent A1c 7.4 - recent urine microalbumin 223 05/06/2023 - recommend starting lisinopril for kidney protection> stop amlodipine> concerns for hypotension with metoprolol and Flomax - recheck urine microalbumin in 6 months  2. Urine test positive for microalbuminuria - see above  3. Nocturnal enuresis - ongoing - scheduled toileting at 12 am and 4 am  - 10/24 started on Flomax  - effectiveness unclear from patient and staff    Family/ staff Communication: plan discussed with patient and nurse  Labs/tests ordered:  blood pressures BID x 7 days, BMP in 2 weeks

## 2023-05-21 LAB — BASIC METABOLIC PANEL
BUN: 35 — AB (ref 4–21)
CO2: 28 — AB (ref 13–22)
Chloride: 107 (ref 99–108)
Creatinine: 1.2 (ref 0.6–1.3)
Glucose: 150
Potassium: 4.2 meq/L (ref 3.5–5.1)
Sodium: 141 (ref 137–147)

## 2023-05-21 LAB — COMPREHENSIVE METABOLIC PANEL: Calcium: 10.4 (ref 8.7–10.7)

## 2023-05-27 ENCOUNTER — Non-Acute Institutional Stay (SKILLED_NURSING_FACILITY): Payer: Federal, State, Local not specified - PPO | Admitting: Orthopedic Surgery

## 2023-05-27 ENCOUNTER — Encounter: Payer: Self-pay | Admitting: Orthopedic Surgery

## 2023-05-27 DIAGNOSIS — N3944 Nocturnal enuresis: Secondary | ICD-10-CM

## 2023-05-27 DIAGNOSIS — E1122 Type 2 diabetes mellitus with diabetic chronic kidney disease: Secondary | ICD-10-CM

## 2023-05-27 DIAGNOSIS — Z8673 Personal history of transient ischemic attack (TIA), and cerebral infarction without residual deficits: Secondary | ICD-10-CM

## 2023-05-27 DIAGNOSIS — N401 Enlarged prostate with lower urinary tract symptoms: Secondary | ICD-10-CM

## 2023-05-27 DIAGNOSIS — H905 Unspecified sensorineural hearing loss: Secondary | ICD-10-CM

## 2023-05-27 DIAGNOSIS — N1831 Chronic kidney disease, stage 3a: Secondary | ICD-10-CM

## 2023-05-27 DIAGNOSIS — N138 Other obstructive and reflux uropathy: Secondary | ICD-10-CM

## 2023-05-27 DIAGNOSIS — H6121 Impacted cerumen, right ear: Secondary | ICD-10-CM | POA: Diagnosis not present

## 2023-05-27 DIAGNOSIS — E1129 Type 2 diabetes mellitus with other diabetic kidney complication: Secondary | ICD-10-CM | POA: Diagnosis not present

## 2023-05-27 DIAGNOSIS — R413 Other amnesia: Secondary | ICD-10-CM

## 2023-05-27 DIAGNOSIS — I10 Essential (primary) hypertension: Secondary | ICD-10-CM

## 2023-05-27 DIAGNOSIS — I2510 Atherosclerotic heart disease of native coronary artery without angina pectoris: Secondary | ICD-10-CM

## 2023-05-27 DIAGNOSIS — R809 Proteinuria, unspecified: Secondary | ICD-10-CM

## 2023-05-27 MED ORDER — DEBROX 6.5 % OT SOLN
5.0000 [drp] | Freq: Every day | OTIC | Status: AC
Start: 2023-05-27 — End: 2023-06-01

## 2023-05-27 NOTE — Progress Notes (Signed)
Location:   [facility]  Nursing Home Room Number: 11-A Place of Service:  SNF (31) Provider:  [name], NP    Patient Care Team: [name], NP as PCP - General (Adult Health Nurse Practitioner) [name], MD as PCP - Cardiology (Cardiology)  Extended Emergency Contact Information Primary Emergency Contact: [name],[name]          [place], [place] Home Phone: [phone] Mobile Phone: [phone] Relation: Daughter Secondary Emergency Contact: [name],[name] Address: [address]          [place], [place] [address] [name] Home Phone: [phone] Mobile Phone: [phone] Relation: Daughter  Code Status:  DNR Goals of care: Advanced Directive information    05/27/2023   10:21 AM  Advanced Directives  Does Patient Have a Medical Advance Directive? Yes  Type of Estate agent of [place];Living will;Out of facility DNR (pink MOST or yellow form)  Does patient want to make changes to medical advance directive? No - Patient declined  Copy of Healthcare Power of Attorney in Chart? Yes - validated most recent copy scanned in chart (See row information)     Chief Complaint  Patient presents with   Medical Management of Chronic Issues    Routine visit.     HPI:  Pt is a 87 y.o. male seen today for medical management of chronic diseases.    He currently resides on the skilled nursing unit at [facility]. PMH: CAD, s/p CABG 2000, HTN, TIA 2022, T2DM, HOH- cochlear implant, OA, BPH, CKD, memory deficit, shuffling gait, and weight loss.     T2DM- A1c 7.4 (10/03)> was 7.7 (06/21), blood sugars 150-170's, no hypoglycemias, urine microalbumin 31 04/27/2022, eye exam 12/03/2022, regular diet, remains on metformin Nocturnal enuresis- improved with scheduled toileting at midnight and 4 am HxTIA- no further workup per neurology, remains on plavix and fenofibrate> allergy to statin CAD- H/o CABG, followed  by cardiology, 10/2020 LVEF 60-65%, remains on plavix HTN- BUN/creat 35/1.2 05/21/2023, remains on amlodipine Memory deficit- BIMS score 12/15 (11/07)> was 15/15 (08/05), CT head 12/2020 atrophy and chronic microvascular ischemic changes noted, no behaviors BPH- remains on finasteride, now on Flomax Sensory hearing loss- followed by Dr. Manson Passey, right cochlear implant in place   Recent blood pressures:  12/03- 130/77  11/27- 132/78  11/26- 116/85  Recent weights:  12/09- 168 lbs  12/01- 164.6 lbs  11/01- 166.8 lbs  10/02- 167.3 lbs  Past Surgical History:  Procedure Laterality Date   APPENDECTOMY     CARDIAC CATHETERIZATION  2000   CHOLECYSTECTOMY     COCHLEAR IMPLANT Right 1990's   COLONOSCOPY W/ POLYPECTOMY     no polyp 2012   CORONARY ARTERY BYPASS GRAFT  2000   CABG X5   INGUINAL HERNIA REPAIR Right 09/15/2015   Procedure: OPEN REPAIR RIGHT INGUINAL HERNIA ;  Surgeon: Avel Peace, MD;  Location: [facility] ORS;  Service: General;  Laterality: Right;   INSERTION OF MESH Right 09/15/2015   Procedure: INSERTION OF MESH;  Surgeon: Avel Peace, MD;  Location: [facility] ORS;  Service: General;  Laterality: Right;   PARATHYROIDECTOMY  1978   POLYPECTOMY     REVERSE SHOULDER ARTHROPLASTY Right 12/30/2014   Procedure: Right shoulder ORIF, Biomet S3 Plate and ZImmer cable system ;  Surgeon: Beverely Low, MD;  Location: [facility] OR;  Service: Orthopedics;  Laterality: Right;   right ankle surgery  age 42   TONSILLECTOMY  1943   trachestomy as  child for throat closing      Allergies  Allergen Reactions   Simvastatin Other (See Comments)    Possible statin-induced pancreatitis    Allergies as of 05/27/2023       Reactions   Simvastatin Other (See Comments)   Possible statin-induced pancreatitis        Medication List        Accurate as of May 27, 2023 10:21 AM. If you have any questions, ask your nurse or doctor.          acetaminophen 500 MG tablet Commonly known as:  TYLENOL Take 1,000 mg by mouth 2 (two) times daily.   allopurinol 100 MG tablet Commonly known as: ZYLOPRIM Take 100 mg by mouth daily.   clopidogrel 75 MG tablet Commonly known as: PLAVIX Take 75 mg by mouth daily.   colchicine 0.6 MG tablet Take 0.6 mg by mouth daily as needed (as directed for gout flares).   ezetimibe 10 MG tablet Commonly known as: ZETIA Take 10 mg by mouth daily.   Fenofibric Acid 135 MG Cpdr Take 1 tablet by mouth in the morning.   finasteride 5 MG tablet Commonly known as: PROSCAR Take 5 mg by mouth every evening.   guaiFENesin-dextromethorphan 100-10 MG/5ML syrup Commonly known as: ROBITUSSIN DM Take 10 mLs by mouth every 6 (six) hours as needed for cough.   ipratropium-albuterol 0.5-2.5 (3) MG/3ML Soln Commonly known as: DUONEB Take 3 mLs by nebulization every 6 (six) hours as needed.   KETOROLAC TROMETHAMINE OP Place 1 drop into the right eye every morning.   lisinopril 5 MG tablet Commonly known as: ZESTRIL Take 1 tablet (5 mg total) by mouth daily.   metFORMIN 500 MG tablet Commonly known as: GLUCOPHAGE Take 500 mg by mouth every morning.   metFORMIN 500 MG tablet Commonly known as: GLUCOPHAGE Take 250 mg by mouth every evening.   miconazole 2 % powder Commonly known as: MICOTIN Apply 1 Application topically daily at 12 noon.   polyethylene glycol powder 17 GM/SCOOP powder Commonly known as: GLYCOLAX/MIRALAX Take 17 g by mouth 2 (two) times a week.   prednisoLONE acetate 1 % ophthalmic suspension Commonly known as: PRED FORTE Place 1 drop into the right eye every morning.   tamsulosin 0.4 MG Caps capsule Commonly known as: FLOMAX Take 0.4 mg by mouth at bedtime.   zinc oxide 20 % ointment Apply 1 application  topically as needed for irritation. Apply transdermally as needed for skin irritation after each incontinent episode.        Review of Systems  Constitutional:  Negative for activity change and appetite change.   HENT:  Positive for hearing loss. Negative for trouble swallowing.   Eyes:  Negative for visual disturbance.  Respiratory:  Negative for cough, shortness of breath and wheezing.   Cardiovascular:  Negative for chest pain and leg swelling.  Gastrointestinal:  Negative for abdominal distention, abdominal pain and constipation.  Genitourinary:  Positive for frequency. Negative for dysuria and hematuria.  Musculoskeletal:  Positive for gait problem. Negative for arthralgias.  Skin:  Negative for wound.  Neurological:  Positive for weakness. Negative for dizziness and headaches.  Psychiatric/Behavioral:  Positive for confusion. Negative for dysphoric mood and sleep disturbance. The patient is not nervous/anxious.     Immunization History  Administered Date(s) Administered   Fluad Quad(high Dose 65+) 04/01/2019, 04/11/2022   Influenza Split 04/13/2011, 05/02/2012   Influenza Whole 05/22/2007, 04/12/2008, 05/02/2009, 04/14/2010   Influenza, High Dose Seasonal PF 04/17/2013, 03/16/2014,  03/05/2016, 03/18/2017, 04/17/2023   Influenza,inj,Quad PF,6+ Mos 03/02/2015, 03/18/2018   Influenza-Unspecified 03/18/2018, 04/12/2021   Moderna Covid-19 Fall Seasonal Vaccine 33yrs & older 04/24/2023   Moderna Covid-19 Vaccine Bivalent Booster 84yrs & up 11/29/2021   Moderna SARS-COV2 Booster Vaccination 03/08/2021   Moderna Sars-Covid-2 Vaccination 06/22/2019, 07/20/2019, 05/02/2020, 11/23/2020   PPD Test 01/02/2015   Pneumococcal Conjugate-13 12/29/2015   Pneumococcal Polysaccharide-23 04/19/2006   Respiratory Syncytial Virus Vaccine,Recomb Aduvanted(Arexvy) 05/14/2022   Tdap 09/04/2016   Unspecified SARS-COV-2 Vaccination 04/24/2022   Zoster Recombinant(Shingrix) 09/17/2019, 03/29/2020   Zoster, Live 06/29/2015   Pertinent  Health Maintenance Due  Topic Date Due   HEMOGLOBIN A1C  09/19/2023   OPHTHALMOLOGY EXAM  12/03/2023   FOOT EXAM  01/25/2024   INFLUENZA VACCINE  Completed      05/23/2022     9:44 AM 08/10/2022    3:12 PM 01/25/2023    1:55 PM 02/04/2023   11:25 AM 03/20/2023   12:07 PM  Fall Risk  Falls in the past year? 0 0 1 1 1   Was there an injury with Fall? 0 0 0 1 1  Fall Risk Category Calculator 0 0 1 3 3   Fall Risk Category (Retired) Low      (RETIRED) Patient Fall Risk Level Low fall risk      Patient at Risk for Falls Due to No Fall Risks History of fall(s);Impaired balance/gait;Impaired mobility History of fall(s);Impaired balance/gait;Impaired mobility History of fall(s);Impaired balance/gait;Impaired mobility History of fall(s);Impaired balance/gait;Impaired mobility  Fall risk Follow up Falls evaluation completed Falls evaluation completed;Education provided Falls evaluation completed;Education provided;Falls prevention discussed Falls evaluation completed;Education provided;Falls prevention discussed Education provided;Falls prevention discussed   Functional Status Survey:    Vitals:   05/27/23 1011  BP: 130/77  Pulse: 80  Resp: 16  Temp: 98.4 F (36.9 C)  SpO2: 98%  Weight: 168 lb (76.2 kg)  Height: 5\' 9"  (1.753 m)   Body mass index is 24.81 kg/m. Physical Exam Vitals reviewed.  Constitutional:      General: He is not in acute distress. HENT:     Head: Normocephalic.     Right Ear: There is impacted cerumen.     Left Ear: There is no impacted cerumen.     Ears:     Comments: Right cochlear implant    Mouth/Throat:     Mouth: Mucous membranes are moist.  Eyes:     General:        Right eye: No discharge.        Left eye: No discharge.     Pupils: Pupils are equal, round, and reactive to light.  Cardiovascular:     Rate and Rhythm: Normal rate and regular rhythm.     Pulses: Normal pulses.     Heart sounds: Normal heart sounds.  Pulmonary:     Effort: Pulmonary effort is normal. No respiratory distress.     Breath sounds: Normal breath sounds. No wheezing.  Abdominal:     General: Bowel sounds are normal.     Palpations: Abdomen is soft.   Musculoskeletal:     Cervical back: Neck supple.     Right lower leg: No edema.     Left lower leg: No edema.  Skin:    General: Skin is warm.     Capillary Refill: Capillary refill takes less than 2 seconds.  Neurological:     General: No focal deficit present.     Mental Status: He is alert and oriented to person, place, and  time.     Motor: Weakness present.     Gait: Gait abnormal.  Psychiatric:        Mood and Affect: Mood normal.     Labs reviewed: Recent Labs    09/11/22 0000 05/06/23 0000  NA 142 140  K 4.4 4.1  CL 107 107  CO2 26* 26*  BUN 33* 34*  CREATININE 1.4* 1.2  CALCIUM 10.7 10.6   Recent Labs    09/11/22 0000 05/06/23 0000  AST 17 16  ALT 15 12  ALKPHOS 35 35  ALBUMIN 4.3 3.9   Recent Labs    09/11/22 0000 09/24/22 0000 05/06/23 0000  WBC 4.6 6.5 5.7  NEUTROABS 2,470.00 3,907.00 3,340.00  HGB 13.6 13.9 12.2*  HCT 39* 40* 37*  PLT 261 282  --    Lab Results  Component Value Date   TSH 2.15 12/07/2022   Lab Results  Component Value Date   HGBA1C 7.4 03/21/2023   Lab Results  Component Value Date   CHOL 212 (A) 09/11/2022   HDL 41 09/11/2022   LDLCALC 140 09/11/2022   LDLDIRECT 96.0 08/23/2020   TRIG 174 (A) 09/11/2022   CHOLHDL 4.4 10/21/2020    Significant Diagnostic Results in last 30 days:  No results found.  Assessment/Plan 1. Right ear impacted cerumen - start Debrox  - will flush ear when debrox complete - carbamide peroxide (DEBROX) 6.5 % OTIC solution; Place 5 drops into the right ear at bedtime for 5 days.  2. Type 2 diabetes mellitus with stage 3a chronic kidney disease, without long-term current use of insulin (HCC) - A1c stable - sugars 150-170's - urine microalbumin elevated last month - started on lisinopril> amlodipine stopped - eye exam 11/2022 - cont metformin  3. Microalbuminuria due to type 2 diabetes mellitus (HCC) - see above - started on lisinopril  - recheck urine 10/2023  4. Nocturnal  enuresis - cont scheduled toileting times - cont Flomax and Finasteride  5. History of TIA (transient ischemic attack) - cont plavix and Zetia  6. Atherosclerosis of native coronary artery of native heart without angina pectoris - cont plavix and Zetia  7. Essential hypertension, benign - controlled, goal < 150/90 - Flomax added last month> no hypotension - cont lisinopril  8. Memory deficit - no behaviors - appropriate today - cont skilled nursing   9. Benign prostatic hyperplasia with urinary obstruction - stable with finasteride and Flomax  10. Sensory hearing loss - has right cochlear implant - followed by Dr. Manson Passey  Family/ staff Communication: plan discussed with patient and nurse  Labs/tests ordered: none

## 2023-06-20 ENCOUNTER — Non-Acute Institutional Stay (SKILLED_NURSING_FACILITY): Payer: Self-pay | Admitting: Internal Medicine

## 2023-06-20 DIAGNOSIS — R296 Repeated falls: Secondary | ICD-10-CM

## 2023-06-20 DIAGNOSIS — Z8673 Personal history of transient ischemic attack (TIA), and cerebral infarction without residual deficits: Secondary | ICD-10-CM

## 2023-06-20 DIAGNOSIS — N401 Enlarged prostate with lower urinary tract symptoms: Secondary | ICD-10-CM

## 2023-06-20 DIAGNOSIS — N1831 Chronic kidney disease, stage 3a: Secondary | ICD-10-CM

## 2023-06-20 DIAGNOSIS — E1122 Type 2 diabetes mellitus with diabetic chronic kidney disease: Secondary | ICD-10-CM

## 2023-06-20 DIAGNOSIS — N138 Other obstructive and reflux uropathy: Secondary | ICD-10-CM

## 2023-06-20 DIAGNOSIS — I10 Essential (primary) hypertension: Secondary | ICD-10-CM | POA: Diagnosis not present

## 2023-06-20 DIAGNOSIS — Z8739 Personal history of other diseases of the musculoskeletal system and connective tissue: Secondary | ICD-10-CM

## 2023-06-20 DIAGNOSIS — E782 Mixed hyperlipidemia: Secondary | ICD-10-CM

## 2023-06-21 NOTE — Progress Notes (Signed)
 [facility]  SNF (31)  Provider:   Code Status: DNR Goals of Care:     05/27/2023   10:21 AM  Advanced Directives  Does Patient Have a Medical Advance Directive? Yes  Type of Estate Agent of [place];Living will;Out of facility DNR (pink MOST or yellow form)  Does patient want to make changes to medical advance directive? No - Patient declined  Copy of Healthcare Power of Attorney in Chart? Yes - validated most recent copy scanned in chart (See row information)     Chief Complaint  Patient presents with   Care Management    HPI: Patient is a 88 y.o. male seen today for medical management of chronic diseases.    Lives in SNF   Patient has a history of CAD, S/P CABG in 2000, gout, HLD, hypertension and diabetes Unstable Gait No further work up per Neurology Has Auditory Implants   Patient had another fall couple of days ago  He says he got up at night to go to bathroom as stff was not there to help him and he fell No Injuries sustained  No Other issues today Wheelchair dependent Can do his transfers  Wt Readings from Last 3 Encounters:  06/28/23 168 lb (76.2 kg)  05/27/23 168 lb (76.2 kg)  05/07/23 167 lb 3.2 oz (75.8 kg)      Past Medical History:  Diagnosis Date   Cancer (HCC)    skin   CKD (chronic kidney disease) 06/24/2013   Cochlear implant in place    Coronary artery disease    a. s/p CABG 2000; b. myoview 9/08: inf-septal scar with mild peri-infarct ischemia (low risk);  c.  Echo 8/13: mild focal basal septal hypertrophy, Gr 1 DD, mild LAE;  d. Lexiscan  Myoview (06/20/2013): No reversible ischemia, inferior and septal infarct, inferior and septal hypokinesis, EF 60%;  e.  Echo (06/20/2011): EF 50-55%, normal wall motion, grade 1 diastolic dysfunction, mild LAE   Diverticular disease    Duodenitis    Elevated PSA    GERD (gastroesophageal reflux disease)    Gout    Humerus fracture    right    Hydronephrosis with ureteropelvic junction obstruction    Hypercalcemia 02/02/2012   a. 01/2012: 10.7.   Hyperlipidemia    Hypertension    Pancreatitis    a. ? Simvastatin  related   Pneumonia age 24 or 7   I think only once (06/19/2013)   Syncope    a. 06/2013   TIA (transient ischemic attack)    a. 01/2012 - presentation as acute imbalance;  b. 01/2012 carotid U/S w/o signif stenosis;  c. 01/2012 Echo: EF 60-65%, Gr 1 DD.;  d.  Carotid US  (06/20/2013): 1-39% bilateral ICA stenosis   Tubular adenoma polyp of rectum    Type II diabetes mellitus (HCC)    diet and exercise controlled at present (06/19/2013)    Past Surgical History:  Procedure Laterality Date   APPENDECTOMY     CARDIAC CATHETERIZATION  2000   CHOLECYSTECTOMY     COCHLEAR IMPLANT Right 1990's   COLONOSCOPY W/ POLYPECTOMY     no polyp 2012   CORONARY ARTERY BYPASS GRAFT  2000   CABG X5   INGUINAL HERNIA REPAIR Right 09/15/2015   Procedure: OPEN REPAIR RIGHT INGUINAL HERNIA ;  Surgeon: Krystal Russell, MD;  Location: [facility];  Service: General;  Laterality: Right;   INSERTION OF MESH Right 09/15/2015   Procedure:  INSERTION OF MESH;  Surgeon: Krystal Russell, MD;  Location: [facility];  Service: General;  Laterality: Right;   PARATHYROIDECTOMY  1978   POLYPECTOMY     REVERSE SHOULDER ARTHROPLASTY Right 12/30/2014   Procedure: Right shoulder ORIF, Biomet S3 Plate and ZImmer cable system ;  Surgeon: Marcey Her, MD;  Location: [facility] OR;  Service: Orthopedics;  Laterality: Right;   right ankle surgery  age 60   TONSILLECTOMY  84   trachestomy as child for throat closing      Allergies  Allergen Reactions   Simvastatin  Other (See Comments)    Possible statin-induced pancreatitis    Outpatient Encounter Medications as of 06/20/2023  Medication Sig   acetaminophen  (TYLENOL ) 500 MG tablet Take 1,000 mg by mouth 2 (two) times daily.   allopurinol  (ZYLOPRIM ) 100 MG tablet Take 100 mg by mouth daily.   Choline Fenofibrate  (FENOFIBRIC  ACID) 135 MG CPDR Take 1 tablet by mouth in the morning.   clopidogrel  (PLAVIX ) 75 MG tablet Take 75 mg by mouth daily.   colchicine  0.6 MG tablet Take 0.6 mg by mouth daily as needed (as directed for gout flares).   ezetimibe (ZETIA) 10 MG tablet Take 10 mg by mouth daily.   finasteride  (PROSCAR ) 5 MG tablet Take 5 mg by mouth every evening.   guaiFENesin -dextromethorphan (ROBITUSSIN DM) 100-10 MG/5ML syrup Take 10 mLs by mouth every 6 (six) hours as needed for cough.   ipratropium-albuterol (DUONEB) 0.5-2.5 (3) MG/3ML SOLN Take 3 mLs by nebulization every 6 (six) hours as needed.   KETOROLAC TROMETHAMINE OP Place 1 drop into the right eye every morning.   lisinopril  (ZESTRIL ) 5 MG tablet Take 1 tablet (5 mg total) by mouth daily.   metFORMIN  (GLUCOPHAGE ) 500 MG tablet Take 500 mg by mouth every morning.   metFORMIN  (GLUCOPHAGE ) 500 MG tablet Take 250 mg by mouth every evening.   miconazole (MICOTIN) 2 % powder Apply 1 Application topically daily at 12 noon.   polyethylene glycol powder (GLYCOLAX /MIRALAX ) 17 GM/SCOOP powder Take 17 g by mouth 2 (two) times a week.   prednisoLONE acetate (PRED FORTE) 1 % ophthalmic suspension Place 1 drop into the right eye every morning.   tamsulosin (FLOMAX) 0.4 MG CAPS capsule Take 0.4 mg by mouth at bedtime.   zinc oxide 20 % ointment Apply 1 application  topically as needed for irritation. Apply transdermally as needed for skin irritation after each incontinent episode.   No facility-administered encounter medications on file as of 06/20/2023.    Review of Systems:  Review of Systems  Constitutional:  Negative for activity change, appetite change and unexpected weight change.  HENT: Negative.    Respiratory:  Negative for cough and shortness of breath.   Cardiovascular:  Negative for leg swelling.  Gastrointestinal:  Negative for constipation.  Genitourinary:  Negative for frequency.  Musculoskeletal:  Negative for arthralgias, gait problem and  myalgias.  Skin: Negative.  Negative for rash.  Neurological:  Negative for dizziness and weakness.  Psychiatric/Behavioral:  Negative for confusion and sleep disturbance.   All other systems reviewed and are negative.   Health Maintenance  Topic Date Due   COVID-19 Vaccine (8 - 2024-25 season) 06/19/2023   HEMOGLOBIN A1C  09/19/2023   OPHTHALMOLOGY EXAM  12/03/2023   FOOT EXAM  01/25/2024   Diabetic kidney evaluation - Urine ACR  05/05/2024   Diabetic kidney evaluation - eGFR measurement  05/20/2024   DTaP/Tdap/Td (2 - Td or Tdap) 09/05/2026   Pneumonia Vaccine 65+ Years  old  Completed   INFLUENZA VACCINE  Completed   Zoster Vaccines- Shingrix  Completed   HPV VACCINES  Aged Out    Physical Exam: Vitals:   06/28/23 1230  BP: 136/80  Pulse: 87  Resp: 20  Temp: 97.7 F (36.5 C)  Weight: 168 lb (76.2 kg)   Body mass index is 24.81 kg/m. Physical Exam Vitals reviewed.  Constitutional:      Appearance: Normal appearance.  HENT:     Head: Normocephalic.     Nose: Nose normal.     Mouth/Throat:     Mouth: Mucous membranes are moist.     Pharynx: Oropharynx is clear.  Eyes:     Pupils: Pupils are equal, round, and reactive to light.  Cardiovascular:     Rate and Rhythm: Normal rate and regular rhythm.     Pulses: Normal pulses.     Heart sounds: No murmur heard. Pulmonary:     Effort: Pulmonary effort is normal. No respiratory distress.     Breath sounds: Normal breath sounds. No rales.  Abdominal:     General: Abdomen is flat. Bowel sounds are normal.     Palpations: Abdomen is soft.  Musculoskeletal:        General: No swelling.     Cervical back: Neck supple.  Skin:    General: Skin is warm.  Neurological:     Mental Status: He is alert and oriented to person, place, and time.  Psychiatric:        Mood and Affect: Mood normal.        Thought Content: Thought content normal.     Labs reviewed: Basic Metabolic Panel: Recent Labs    09/11/22 0000  12/07/22 0000 05/06/23 0000 05/21/23 0000  NA 142  --  140 141  K 4.4  --  4.1 4.2  CL 107  --  107 107  CO2 26*  --  26* 28*  BUN 33*  --  34* 35*  CREATININE 1.4*  --  1.2 1.2  CALCIUM 10.7  --  10.6 10.4  TSH  --  2.15  --   --    Liver Function Tests: Recent Labs    09/11/22 0000 05/06/23 0000  AST 17 16  ALT 15 12  ALKPHOS 35 35  ALBUMIN 4.3 3.9   No results for input(s): LIPASE, AMYLASE in the last 8760 hours. No results for input(s): AMMONIA in the last 8760 hours. CBC: Recent Labs    09/11/22 0000 09/24/22 0000 05/06/23 0000  WBC 4.6 6.5 5.7  NEUTROABS 2,470.00 3,907.00 3,340.00  HGB 13.6 13.9 12.2*  HCT 39* 40* 37*  PLT 261 282  --    Lipid Panel: Recent Labs    09/11/22 0000  CHOL 212*  HDL 41  LDLCALC 140  TRIG 825*   Lab Results  Component Value Date   HGBA1C 7.4 03/21/2023    Procedures since last visit: No results found.  Assessment/Plan 1. Type 2 diabetes mellitus with stage 3a chronic kidney disease, without long-term current use of insulin  (HCC) (Primary) A1C 7.4 in 10/24 Only on Metformin   2. History of TIA (transient ischemic attack) Plavix  and Zetia Allergic to Statin  3. Essential hypertension, benign Norvasc  was change to Lisinopril  for Microalbuminuria Some BP readings are high  Will continue to monitor 4. Benign prostatic hyperplasia with urinary obstruction Proscar  and Flomax  5. Mixed hyperlipidemia Zetia and Fenofibrate   6. Stage 3a chronic kidney disease (HCC) Creat stable  7. History  of gout Colchicine  and Allopurinol   8. Frequent falls Again reminded  him to wait for staff to come and help him at night    Labs/tests ordered:  * No order type specified * Next appt:  Visit date not found

## 2023-06-28 ENCOUNTER — Encounter: Payer: Self-pay | Admitting: Internal Medicine

## 2023-07-22 ENCOUNTER — Non-Acute Institutional Stay (SKILLED_NURSING_FACILITY): Payer: Self-pay | Admitting: Orthopedic Surgery

## 2023-07-22 ENCOUNTER — Encounter: Payer: Self-pay | Admitting: Orthopedic Surgery

## 2023-07-22 DIAGNOSIS — I10 Essential (primary) hypertension: Secondary | ICD-10-CM

## 2023-07-22 DIAGNOSIS — Z8673 Personal history of transient ischemic attack (TIA), and cerebral infarction without residual deficits: Secondary | ICD-10-CM

## 2023-07-22 DIAGNOSIS — R809 Proteinuria, unspecified: Secondary | ICD-10-CM

## 2023-07-22 DIAGNOSIS — D692 Other nonthrombocytopenic purpura: Secondary | ICD-10-CM

## 2023-07-22 DIAGNOSIS — H905 Unspecified sensorineural hearing loss: Secondary | ICD-10-CM

## 2023-07-22 DIAGNOSIS — I2510 Atherosclerotic heart disease of native coronary artery without angina pectoris: Secondary | ICD-10-CM

## 2023-07-22 DIAGNOSIS — N3944 Nocturnal enuresis: Secondary | ICD-10-CM

## 2023-07-22 DIAGNOSIS — R413 Other amnesia: Secondary | ICD-10-CM

## 2023-07-22 DIAGNOSIS — E1122 Type 2 diabetes mellitus with diabetic chronic kidney disease: Secondary | ICD-10-CM

## 2023-07-22 DIAGNOSIS — N138 Other obstructive and reflux uropathy: Secondary | ICD-10-CM

## 2023-07-22 DIAGNOSIS — N401 Enlarged prostate with lower urinary tract symptoms: Secondary | ICD-10-CM

## 2023-07-22 DIAGNOSIS — N1831 Type 2 diabetes mellitus with diabetic chronic kidney disease: Secondary | ICD-10-CM

## 2023-07-22 MED ORDER — LISINOPRIL 10 MG PO TABS: 10.0000 mg | ORAL_TABLET | Freq: Every day | ORAL | Status: DC

## 2023-07-22 NOTE — Progress Notes (Signed)
Location:   [facility] Room Number: 11-A Place of Service:  SNF (31) Provider:  [name], NP  PCP: [name], NP  Patient Care Team: [name], NP as PCP - General (Adult Health Nurse Practitioner) [name], MD as PCP - Cardiology (Cardiology)  Extended Emergency Contact Information Primary Emergency Contact: [name],[name]          [place], [place] Home Phone: [phone] Mobile Phone: [phone] Relation: Daughter Secondary Emergency Contact: [name],[name] Address: [address]          [place], [place] [address] [name] Home Phone: [phone] Mobile Phone: [phone] Relation: Daughter  Code Status:  DNR Goals of care: Advanced Directive information    07/22/2023    3:09 PM  Advanced Directives  Does Patient Have a Medical Advance Directive? Yes  Type of Estate agent of [place];Living will;Out of facility DNR (pink MOST or yellow form)  Does patient want to make changes to medical advance directive? No - Patient declined  Copy of Healthcare Power of Attorney in Chart? Yes - validated most recent copy scanned in chart (See row information)     Chief Complaint  Patient presents with   Medical Management of Chronic Issues    Routine visit.    Immunizations    Discuss the need for [employer].     HPI:  Pt is a 88 y.o. male seen today for medical management of chronic diseases.     Past Medical History:  Diagnosis Date   Cancer (HCC)    skin   CKD (chronic kidney disease) 06/24/2013   Cochlear implant in place    Coronary artery disease    a. s/p CABG 2000; b. myoview 9/08: inf-septal scar with mild peri-infarct ischemia (low risk);  c.  Echo 8/13: mild focal basal septal hypertrophy, Gr 1 DD, mild LAE;  d. Lexiscan Myoview (06/20/2013): No reversible ischemia, inferior and septal infarct, inferior and septal hypokinesis, EF 60%;  e.  Echo (06/20/2011): EF 50-55%,  normal wall motion, grade 1 diastolic dysfunction, mild LAE   Diverticular disease    Duodenitis    Elevated PSA    GERD (gastroesophageal reflux disease)    Gout    Humerus fracture    right   Hydronephrosis with ureteropelvic junction obstruction    Hypercalcemia 02/02/2012   a. 01/2012: 10.7.   Hyperlipidemia    Hypertension    Pancreatitis    a. ? Simvastatin related   Pneumonia age 84 or 7   "I think only once" (06/19/2013)   Syncope    a. 06/2013   TIA (transient ischemic attack)    a. 01/2012 - presentation as acute imbalance;  b. 01/2012 carotid U/S w/o signif stenosis;  c. 01/2012 Echo: EF 60-65%, Gr 1 DD.;  d.  Carotid US (06/20/2013): 1-39% bilateral ICA stenosis   Tubular adenoma polyp of rectum    Type II diabetes mellitus (HCC)    "diet and exercise controlled at present" (06/19/2013)   Past Surgical History:  Procedure Laterality Date   APPENDECTOMY     CARDIAC CATHETERIZATION  2000   CHOLECYSTECTOMY     COCHLEAR IMPLANT Right 1990's   COLONOSCOPY W/ POLYPECTOMY     no polyp 2012   CORONARY ARTERY BYPASS GRAFT  2000   CABG X5   INGUINAL HERNIA REPAIR Right 09/15/2015   Procedure: OPEN REPAIR RIGHT INGUINAL HERNIA ;  Surgeon: Avel Peace, MD;  Location:  [facility];  Service: General;  Laterality: Right;   INSERTION OF MESH Right 09/15/2015   Procedure: INSERTION OF MESH;  Surgeon: Avel Peace, MD;  Location: [facility];  Service: General;  Laterality: Right;   PARATHYROIDECTOMY  1978   POLYPECTOMY     REVERSE SHOULDER ARTHROPLASTY Right 12/30/2014   Procedure: Right shoulder ORIF, Biomet S3 Plate and ZImmer cable system ;  Surgeon: Beverely Low, MD;  Location: [facility] OR;  Service: Orthopedics;  Laterality: Right;   right ankle surgery  age 23   TONSILLECTOMY  75   trachestomy as child for throat closing      Allergies  Allergen Reactions   Simvastatin Other (See Comments)    Possible statin-induced pancreatitis    Allergies as of 07/22/2023       Reactions    Simvastatin Other (See Comments)   Possible statin-induced pancreatitis        Medication List        Accurate as of July 22, 2023  3:09 PM. If you have any questions, ask your nurse or doctor.          STOP taking these medications    KETOROLAC TROMETHAMINE OP Stopped by: Amy E Fargo   prednisoLONE acetate 1 % ophthalmic suspension Commonly known as: PRED FORTE Stopped by: Amy E Fargo       TAKE these medications    acetaminophen 500 MG tablet Commonly known as: TYLENOL Take 1,000 mg by mouth 2 (two) times daily.   allopurinol 100 MG tablet Commonly known as: ZYLOPRIM Take 100 mg by mouth daily.   clopidogrel 75 MG tablet Commonly known as: PLAVIX Take 75 mg by mouth daily.   colchicine 0.6 MG tablet Take 0.6 mg by mouth daily as needed (as directed for gout flares).   ezetimibe 10 MG tablet Commonly known as: ZETIA Take 10 mg by mouth daily.   Fenofibric Acid 135 MG Cpdr Take 1 tablet by mouth in the morning.   finasteride 5 MG tablet Commonly known as: PROSCAR Take 5 mg by mouth every evening.   guaiFENesin-dextromethorphan 100-10 MG/5ML syrup Commonly known as: ROBITUSSIN DM Take 10 mLs by mouth every 6 (six) hours as needed for cough.   ipratropium-albuterol 0.5-2.5 (3) MG/3ML Soln Commonly known as: DUONEB Take 3 mLs by nebulization every 6 (six) hours as needed.   lisinopril 5 MG tablet Commonly known as: ZESTRIL Take 1 tablet (5 mg total) by mouth daily.   metFORMIN 500 MG tablet Commonly known as: GLUCOPHAGE Take 500 mg by mouth every morning.   metFORMIN 500 MG tablet Commonly known as: GLUCOPHAGE Take 250 mg by mouth every evening.   miconazole 2 % powder Commonly known as: MICOTIN Apply 1 Application topically daily at 12 noon.   polyethylene glycol powder 17 GM/SCOOP powder Commonly known as: GLYCOLAX/MIRALAX Take 17 g by mouth 2 (two) times a week.   tamsulosin 0.4 MG Caps capsule Commonly known as: FLOMAX Take 0.4  mg by mouth at bedtime.   zinc oxide 20 % ointment Apply 1 application  topically as needed for irritation. Apply transdermally as needed for skin irritation after each incontinent episode.        Review of Systems  Immunization History  Administered Date(s) Administered   Fluad Quad(high Dose 65+) 04/01/2019, 04/11/2022   Influenza Split 04/13/2011, 05/02/2012   Influenza Whole 05/22/2007, 04/12/2008, 05/02/2009, 04/14/2010   Influenza, High Dose Seasonal PF 04/17/2013, 03/16/2014, 03/05/2016, 03/18/2017, 04/17/2023   Influenza,inj,Quad PF,6+ Mos 03/02/2015, 03/18/2018  Influenza-Unspecified 03/18/2018, 04/12/2021   Moderna Covid-19 Fall Seasonal Vaccine 73yrs & older 04/24/2023   Moderna Covid-19 Vaccine Bivalent Booster 85yrs & up 11/29/2021   Moderna SARS-COV2 Booster Vaccination 03/08/2021   Moderna Sars-Covid-2 Vaccination 06/22/2019, 07/20/2019, 05/02/2020, 11/23/2020   PPD Test 01/02/2015   Pneumococcal Conjugate-13 12/29/2015   Pneumococcal Polysaccharide-23 04/19/2006   Respiratory Syncytial Virus Vaccine,Recomb Aduvanted(Arexvy) 05/14/2022   Tdap 09/04/2016   Unspecified SARS-COV-2 Vaccination 04/24/2022   Zoster Recombinant(Shingrix) 09/17/2019, 03/29/2020   Zoster, Live 06/29/2015   Pertinent  Health Maintenance Due  Topic Date Due   HEMOGLOBIN A1C  09/19/2023   OPHTHALMOLOGY EXAM  12/03/2023   FOOT EXAM  01/25/2024   INFLUENZA VACCINE  Completed      08/10/2022    3:12 PM 01/25/2023    1:55 PM 02/04/2023   11:25 AM 03/20/2023   12:07 PM 05/27/2023   11:29 AM  Fall Risk  Falls in the past year? 0 1 1 1 1   Was there an injury with Fall? 0 0 1 1 0  Fall Risk Category Calculator 0 1 3 3 2   Patient at Risk for Falls Due to History of fall(s);Impaired balance/gait;Impaired mobility History of fall(s);Impaired balance/gait;Impaired mobility History of fall(s);Impaired balance/gait;Impaired mobility History of fall(s);Impaired balance/gait;Impaired mobility History  of fall(s);Impaired balance/gait;Impaired mobility  Fall risk Follow up Falls evaluation completed;Education provided Falls evaluation completed;Education provided;Falls prevention discussed Falls evaluation completed;Education provided;Falls prevention discussed Education provided;Falls prevention discussed Falls evaluation completed;Education provided;Falls prevention discussed   Functional Status Survey:    Vitals:   07/22/23 1458 07/22/23 1459  BP: (!) 156/85 (!) 141/76  Pulse: 86   Resp: 16   Temp: (!) 97.3 F (36.3 C)   SpO2: 93%   Weight: 172 lb 4.8 oz (78.2 kg)   Height: 5\' 9"  (1.753 m)    Body mass index is 25.44 kg/m. Physical Exam  Labs reviewed: Recent Labs    09/11/22 0000 05/06/23 0000 05/21/23 0000  NA 142 140 141  K 4.4 4.1 4.2  CL 107 107 107  CO2 26* 26* 28*  BUN 33* 34* 35*  CREATININE 1.4* 1.2 1.2  CALCIUM 10.7 10.6 10.4   Recent Labs    09/11/22 0000 05/06/23 0000  AST 17 16  ALT 15 12  ALKPHOS 35 35  ALBUMIN 4.3 3.9   Recent Labs    09/11/22 0000 09/24/22 0000 05/06/23 0000  WBC 4.6 6.5 5.7  NEUTROABS 2,470.00 3,907.00 3,340.00  HGB 13.6 13.9 12.2*  HCT 39* 40* 37*  PLT 261 282  --    Lab Results  Component Value Date   TSH 2.15 12/07/2022   Lab Results  Component Value Date   HGBA1C 7.4 03/21/2023   Lab Results  Component Value Date   CHOL 212 (A) 09/11/2022   HDL 41 09/11/2022   LDLCALC 140 09/11/2022   LDLDIRECT 96.0 08/23/2020   TRIG 174 (A) 09/11/2022   CHOLHDL 4.4 10/21/2020    Significant Diagnostic Results in last 30 days:  No results found.  Assessment/Plan There are no diagnoses linked to this encounter.   Family/ staff Communication:   Labs/tests ordered:

## 2023-07-22 NOTE — Progress Notes (Signed)
Location:  [facility] Room Number: 11-A Place of Service:  SNF (31) Provider:  [name], NP   [name], NP  Patient Care Team: [name], NP as PCP - General (Adult Health Nurse Practitioner) [name], MD as PCP - Cardiology (Cardiology)  Extended Emergency Contact Information Primary Emergency Contact: [name],[name]          [place],  [place] Home Phone: [phone] Mobile Phone: [phone] Relation: Daughter Secondary Emergency Contact: [name],[name] Address: [address]          [place], [place] [address] [name] Home Phone: [phone] Mobile Phone: [phone] Relation: Daughter  Code Status:  DNR Goals of care: Advanced Directive information    05/27/2023   10:21 AM  Advanced Directives  Does Patient Have a Medical Advance Directive? Yes  Type of Estate agent of [place];Living will;Out of facility DNR (pink MOST or yellow form)  Does patient want to make changes to medical advance directive? No - Patient declined  Copy of Healthcare Power of Attorney in Chart? Yes - validated most recent copy scanned in chart (See row information)     Chief Complaint  Patient presents with   Medical Management of Chronic Issues    Routine visit.    Immunizations    Discuss the need for [employer].     HPI:  Pt is a 88 y.o. male seen today for medical management of chronic diseases.    He currently resides on the skilled nursing unit at [facility]. PMH: CAD, s/p CABG 2000, HTN, TIA 2022, T2DM, HOH- cochlear implant, OA, BPH, CKD, memory deficit, shuffling gait, and weight loss.     T2DM- A1c 7.4 (10/03)> was 7.7 (06/21), blood sugars 180-220's, no hypoglycemias, urine microalbumin 31 04/27/2022, eye exam 12/03/2022, regular diet, remains on metformin Nocturnal enuresis- scheduled toileting at midnight and 4 am Hx TIA- no further workup per neurology, remains on  plavix and fenofibrate> allergy to statin CAD- H/o CABG, followed by cardiology, 10/2020 LVEF 60-65%, remains on plavix HTN- BUN/creat 35/1.2 05/21/2023, remains on amlodipine Memory deficit- BIMS score 12/15 (11/07)> was 15/15 (08/05), CT head 12/2020 atrophy and chronic microvascular ischemic changes noted, no behaviors BPH- remains on finasteride, remains on Flomax and finasteride Sensory hearing loss- followed by Dr. Manson Passey, right cochlear implant in place   Recent blood pressures:  01/28- 156/85   01/21- 150/89  01/17- 130/75  Recent weights:  02/03- 172.3 lbs  01/02- 171.2 lbs ' 12/01- 164.6 lbs    Past Medical History:  Diagnosis Date   Cancer (HCC)    skin   CKD (chronic kidney disease) 06/24/2013   Cochlear implant in place    Coronary artery disease    a. s/p CABG 2000; b. myoview 9/08: inf-septal scar with mild peri-infarct ischemia (low risk);  c.  Echo 8/13: mild focal basal septal hypertrophy, Gr 1 DD, mild LAE;  d. Lexiscan Myoview (06/20/2013): No reversible ischemia, inferior and septal infarct, inferior and septal hypokinesis, EF 60%;  e.  Echo (06/20/2011): EF 50-55%, normal wall motion, grade 1 diastolic dysfunction, mild LAE   Diverticular disease    Duodenitis    Elevated PSA    GERD (gastroesophageal reflux disease)    Gout    Humerus fracture    right   Hydronephrosis with ureteropelvic junction obstruction    Hypercalcemia 02/02/2012   a. 01/2012: 10.7.   Hyperlipidemia    Hypertension  Pancreatitis    a. ? Simvastatin related   Pneumonia age 65 or 7   "I think only once" (06/19/2013)   Syncope    a. 06/2013   TIA (transient ischemic attack)    a. 01/2012 - presentation as acute imbalance;  b. 01/2012 carotid U/S w/o signif stenosis;  c. 01/2012 Echo: EF 60-65%, Gr 1 DD.;  d.  Carotid US (06/20/2013): 1-39% bilateral ICA stenosis   Tubular adenoma polyp of rectum    Type II diabetes mellitus (HCC)    "diet and exercise controlled at present" (06/19/2013)    Past Surgical History:  Procedure Laterality Date   APPENDECTOMY     CARDIAC CATHETERIZATION  2000   CHOLECYSTECTOMY     COCHLEAR IMPLANT Right 1990's   COLONOSCOPY W/ POLYPECTOMY     no polyp 2012   CORONARY ARTERY BYPASS GRAFT  2000   CABG X5   INGUINAL HERNIA REPAIR Right 09/15/2015   Procedure: OPEN REPAIR RIGHT INGUINAL HERNIA ;  Surgeon: Avel Peace, MD;  Location: [facility];  Service: General;  Laterality: Right;   INSERTION OF MESH Right 09/15/2015   Procedure: INSERTION OF MESH;  Surgeon: Avel Peace, MD;  Location: [facility];  Service: General;  Laterality: Right;   PARATHYROIDECTOMY  1978   POLYPECTOMY     REVERSE SHOULDER ARTHROPLASTY Right 12/30/2014   Procedure: Right shoulder ORIF, Biomet S3 Plate and ZImmer cable system ;  Surgeon: Beverely Low, MD;  Location: [facility] OR;  Service: Orthopedics;  Laterality: Right;   right ankle surgery  age 5   TONSILLECTOMY  71   trachestomy as child for throat closing      Allergies  Allergen Reactions   Simvastatin Other (See Comments)    Possible statin-induced pancreatitis    Outpatient Encounter Medications as of 07/22/2023  Medication Sig   acetaminophen (TYLENOL) 500 MG tablet Take 1,000 mg by mouth 2 (two) times daily.   allopurinol (ZYLOPRIM) 100 MG tablet Take 100 mg by mouth daily.   Choline Fenofibrate (FENOFIBRIC ACID) 135 MG CPDR Take 1 tablet by mouth in the morning.   clopidogrel (PLAVIX) 75 MG tablet Take 75 mg by mouth daily.   colchicine 0.6 MG tablet Take 0.6 mg by mouth daily as needed (as directed for gout flares).   ezetimibe (ZETIA) 10 MG tablet Take 10 mg by mouth daily.   finasteride (PROSCAR) 5 MG tablet Take 5 mg by mouth every evening.   guaiFENesin-dextromethorphan (ROBITUSSIN DM) 100-10 MG/5ML syrup Take 10 mLs by mouth every 6 (six) hours as needed for cough.   ipratropium-albuterol (DUONEB) 0.5-2.5 (3) MG/3ML SOLN Take 3 mLs by nebulization every 6 (six) hours as needed.   KETOROLAC TROMETHAMINE  OP Place 1 drop into the right eye every morning.   lisinopril (ZESTRIL) 5 MG tablet Take 1 tablet (5 mg total) by mouth daily.   metFORMIN (GLUCOPHAGE) 500 MG tablet Take 500 mg by mouth every morning.   metFORMIN (GLUCOPHAGE) 500 MG tablet Take 250 mg by mouth every evening.   miconazole (MICOTIN) 2 % powder Apply 1 Application topically daily at 12 noon.   polyethylene glycol powder (GLYCOLAX/MIRALAX) 17 GM/SCOOP powder Take 17 g by mouth 2 (two) times a week.   prednisoLONE acetate (PRED FORTE) 1 % ophthalmic suspension Place 1 drop into the right eye every morning.   tamsulosin (FLOMAX) 0.4 MG CAPS capsule Take 0.4 mg by mouth at bedtime.   zinc oxide 20 % ointment Apply 1 application  topically as needed for  irritation. Apply transdermally as needed for skin irritation after each incontinent episode.   No facility-administered encounter medications on file as of 07/22/2023.    Review of Systems  Constitutional: Negative.   HENT:  Positive for hearing loss.   Eyes: Negative.   Respiratory: Negative.    Genitourinary:  Positive for frequency.       Incontinence  Musculoskeletal:  Positive for gait problem.  Skin:  Negative for wound.  Neurological:  Positive for weakness. Negative for dizziness and light-headedness.  Psychiatric/Behavioral:  Positive for confusion. Negative for dysphoric mood and sleep disturbance. The patient is not nervous/anxious.     Immunization History  Administered Date(s) Administered   Fluad Quad(high Dose 65+) 04/01/2019, 04/11/2022   Influenza Split 04/13/2011, 05/02/2012   Influenza Whole 05/22/2007, 04/12/2008, 05/02/2009, 04/14/2010   Influenza, High Dose Seasonal PF 04/17/2013, 03/16/2014, 03/05/2016, 03/18/2017, 04/17/2023   Influenza,inj,Quad PF,6+ Mos 03/02/2015, 03/18/2018   Influenza-Unspecified 03/18/2018, 04/12/2021   Moderna Covid-19 Fall Seasonal Vaccine 36yrs & older 04/24/2023   Moderna Covid-19 Vaccine Bivalent Booster 36yrs & up  11/29/2021   Moderna SARS-COV2 Booster Vaccination 03/08/2021   Moderna Sars-Covid-2 Vaccination 06/22/2019, 07/20/2019, 05/02/2020, 11/23/2020   PPD Test 01/02/2015   Pneumococcal Conjugate-13 12/29/2015   Pneumococcal Polysaccharide-23 04/19/2006   Respiratory Syncytial Virus Vaccine,Recomb Aduvanted(Arexvy) 05/14/2022   Tdap 09/04/2016   Unspecified SARS-COV-2 Vaccination 04/24/2022   Zoster Recombinant(Shingrix) 09/17/2019, 03/29/2020   Zoster, Live 06/29/2015   Pertinent  Health Maintenance Due  Topic Date Due   HEMOGLOBIN A1C  09/19/2023   OPHTHALMOLOGY EXAM  12/03/2023   FOOT EXAM  01/25/2024   INFLUENZA VACCINE  Completed      08/10/2022    3:12 PM 01/25/2023    1:55 PM 02/04/2023   11:25 AM 03/20/2023   12:07 PM 05/27/2023   11:29 AM  Fall Risk  Falls in the past year? 0 1 1 1 1   Was there an injury with Fall? 0 0 1 1 0  Fall Risk Category Calculator 0 1 3 3 2   Patient at Risk for Falls Due to History of fall(s);Impaired balance/gait;Impaired mobility History of fall(s);Impaired balance/gait;Impaired mobility History of fall(s);Impaired balance/gait;Impaired mobility History of fall(s);Impaired balance/gait;Impaired mobility History of fall(s);Impaired balance/gait;Impaired mobility  Fall risk Follow up Falls evaluation completed;Education provided Falls evaluation completed;Education provided;Falls prevention discussed Falls evaluation completed;Education provided;Falls prevention discussed Education provided;Falls prevention discussed Falls evaluation completed;Education provided;Falls prevention discussed   Functional Status Survey:    Vitals:   07/22/23 1458 07/22/23 1459  BP: (!) 156/85 (!) 141/76  Pulse: 86   Resp: 16   Temp: (!) 97.3 F (36.3 C)   SpO2: 93%   Weight: 172 lb 4.8 oz (78.2 kg)   Height: 5\' 9"  (1.753 m)    Body mass index is 25.44 kg/m. Physical Exam Vitals reviewed.  Constitutional:      General: He is not in acute distress. HENT:      Head: Normocephalic.     Right Ear: There is no impacted cerumen.     Left Ear: There is no impacted cerumen.     Ears:     Comments: Right cochlear implant    Nose: Nose normal.     Mouth/Throat:     Mouth: Mucous membranes are moist.  Eyes:     General:        Right eye: No discharge.        Left eye: No discharge.  Cardiovascular:     Rate and Rhythm: Normal rate and regular rhythm.  Pulses: Normal pulses.     Heart sounds: Normal heart sounds.  Pulmonary:     Effort: Pulmonary effort is normal. No respiratory distress.     Breath sounds: Normal breath sounds. No wheezing or rales.  Abdominal:     General: Bowel sounds are normal. There is no distension.     Palpations: Abdomen is soft.     Tenderness: There is no abdominal tenderness.  Musculoskeletal:     Cervical back: Neck supple.     Right lower leg: No edema.     Left lower leg: No edema.  Skin:    General: Skin is warm.     Capillary Refill: Capillary refill takes less than 2 seconds.     Comments: Non tender purple lesions to left hand and arm, no skin breakdown  Neurological:     General: No focal deficit present.     Mental Status: He is alert and oriented to person, place, and time.     Motor: Weakness present.     Gait: Gait abnormal.     Comments: wheelchair  Psychiatric:        Mood and Affect: Mood normal.     Labs reviewed: Recent Labs    09/11/22 0000 05/06/23 0000 05/21/23 0000  NA 142 140 141  K 4.4 4.1 4.2  CL 107 107 107  CO2 26* 26* 28*  BUN 33* 34* 35*  CREATININE 1.4* 1.2 1.2  CALCIUM 10.7 10.6 10.4   Recent Labs    09/11/22 0000 05/06/23 0000  AST 17 16  ALT 15 12  ALKPHOS 35 35  ALBUMIN 4.3 3.9   Recent Labs    09/11/22 0000 09/24/22 0000 05/06/23 0000  WBC 4.6 6.5 5.7  NEUTROABS 2,470.00 3,907.00 3,340.00  HGB 13.6 13.9 12.2*  HCT 39* 40* 37*  PLT 261 282  --    Lab Results  Component Value Date   TSH 2.15 12/07/2022   Lab Results  Component Value Date    HGBA1C 7.4 03/21/2023   Lab Results  Component Value Date   CHOL 212 (A) 09/11/2022   HDL 41 09/11/2022   LDLCALC 140 09/11/2022   LDLDIRECT 96.0 08/23/2020   TRIG 174 (A) 09/11/2022   CHOLHDL 4.4 10/21/2020    Significant Diagnostic Results in last 30 days:  No results found.  Assessment/Plan 1. Type 2 diabetes mellitus with stage 3a chronic kidney disease, without long-term current use of insulin (HCC) (Primary) - A1c stable, sugars slightly increased within past month - elevated microalbumin 04/2023> started on lisinopril - eye exam 11/2022 - cont metformin  2. Nocturnal enuresis - stable with scheduled toileting  - cont Flomax and Finasteride  3. History of TIA (transient ischemic attack) - cont plavix and statin  4. Atherosclerosis of native coronary artery of native heart without angina pectoris - cont plavix and statin  5. Essential hypertension, benign - elevated since changing to lisinopril 5 mg - will increase lisinopril to 10 mg  - check blood pressures BID x 7 days> add   6. Memory deficit - no behaviors - cont skilled nursing   7. Benign prostatic hyperplasia with urinary obstruction - cont Flomax and finasteride  8. Sensory hearing loss - followed by Dr. Manson Passey - has right cochlear implant   9. Senile purpura (HCC) - education given to staff and patient    Family/ staff Communication: plan discussed with patient and nurse  Labs/tests ordered:  A1c and lipid panel 02/06

## 2023-07-26 ENCOUNTER — Other Ambulatory Visit: Payer: Self-pay | Admitting: Orthopedic Surgery

## 2023-07-26 LAB — LIPID PANEL
Cholesterol: 167 (ref 0–200)
HDL: 40 (ref 35–70)
LDL Cholesterol: 100
Triglycerides: 177 — AB (ref 40–160)

## 2023-07-26 LAB — HEMOGLOBIN A1C: Hemoglobin A1C: 8

## 2023-08-16 ENCOUNTER — Encounter: Payer: Self-pay | Admitting: Orthopedic Surgery

## 2023-08-16 ENCOUNTER — Non-Acute Institutional Stay: Payer: Self-pay | Admitting: Orthopedic Surgery

## 2023-08-16 DIAGNOSIS — Z0000 Encounter for general adult medical examination without abnormal findings: Secondary | ICD-10-CM

## 2023-08-16 NOTE — Progress Notes (Signed)
 Subjective:   Juan [name] is a 88 y.o. male who presents for Medicare Annual/Subsequent preventive examination.  Visit Complete: In person  Patient Medicare AWV questionnaire was completed by the patient on 08/16/2023; I have confirmed that all information answered by patient is correct and no changes since this date.  Cardiac Risk Factors include: advanced age (>48men, >25 women);hypertension;male gender;sedentary lifestyle     Objective:    Today's Vitals   08/16/23 1349  BP: 134/77  Pulse: 88  Resp: 18  Temp: (!) 97.4 F (36.3 C)  SpO2: 94%  Weight: 170 lb 6.4 oz (77.3 kg)  Height: 5\' 9"  (1.753 m)   Body mass index is 25.16 kg/m.     07/22/2023    3:09 PM 05/27/2023   10:21 AM 04/29/2023    9:24 AM 03/28/2023   11:12 AM 02/22/2023   11:49 AM 02/03/2023    7:55 PM 01/25/2023    9:02 AM  Advanced Directives  Does Patient Have a Medical Advance Directive? Yes Yes Yes Yes Yes Yes Yes  Type of Estate agent of [place];Living will;Out of facility DNR (pink MOST or yellow form) Healthcare Power of [place];Living will;Out of facility DNR (pink MOST or yellow form) Healthcare Power of [place];Living will;Out of facility DNR (pink MOST or yellow form) Healthcare Power of [place];Living will;Out of facility DNR (pink MOST or yellow form) Healthcare Power of [place];Living will;Out of facility DNR (pink MOST or yellow form) Healthcare Power of [place];Living will Healthcare Power of [place];Living will;Out of facility DNR (pink MOST or yellow form)  Does patient want to make changes to medical advance directive? No - Patient declined No - Patient declined No - Patient declined No - Guardian declined No - Patient declined No - Patient declined No - Patient declined  Copy of Healthcare Power of Attorney in Chart? Yes - validated most recent copy scanned in chart (See row information) Yes - validated most recent copy scanned in chart (See row information) Yes -  validated most recent copy scanned in chart (See row information) Yes - validated most recent copy scanned in chart (See row information) Yes - validated most recent copy scanned in chart (See row information) Yes - validated most recent copy scanned in chart (See row information) Yes - validated most recent copy scanned in chart (See row information)    Current Medications (verified) Outpatient Encounter Medications as of 08/16/2023  Medication Sig   acetaminophen (TYLENOL) 500 MG tablet Take 1,000 mg by mouth 2 (two) times daily.   allopurinol (ZYLOPRIM) 100 MG tablet Take 100 mg by mouth daily.   Choline Fenofibrate (FENOFIBRIC ACID) 135 MG CPDR Take 1 tablet by mouth in the morning.   clopidogrel (PLAVIX) 75 MG tablet Take 75 mg by mouth daily.   colchicine 0.6 MG tablet Take 0.6 mg by mouth daily as needed (as directed for gout flares).   ezetimibe (ZETIA) 10 MG tablet Take 10 mg by mouth daily.   finasteride (PROSCAR) 5 MG tablet Take 5 mg by mouth every evening.   guaiFENesin-dextromethorphan (ROBITUSSIN DM) 100-10 MG/5ML syrup Take 10 mLs by mouth every 6 (six) hours as needed for cough.   ipratropium-albuterol (DUONEB) 0.5-2.5 (3) MG/3ML SOLN Take 3 mLs by nebulization every 6 (six) hours as needed.   lisinopril (ZESTRIL) 10 MG tablet Take 1 tablet (10 mg total) by mouth daily.   metFORMIN (GLUCOPHAGE) 500 MG tablet Take 500 mg by mouth every morning.   metFORMIN (GLUCOPHAGE) 500 MG tablet Take  500 mg by mouth every evening.   miconazole (MICOTIN) 2 % powder Apply 1 Application topically daily at 12 noon.   polyethylene glycol powder (GLYCOLAX/MIRALAX) 17 GM/SCOOP powder Take 17 g by mouth 2 (two) times a week.   tamsulosin (FLOMAX) 0.4 MG CAPS capsule Take 0.4 mg by mouth at bedtime.   zinc oxide 20 % ointment Apply 1 application  topically as needed for irritation. Apply transdermally as needed for skin irritation after each incontinent episode.   No facility-administered encounter  medications on file as of 08/16/2023.    Allergies (verified) Simvastatin   History: Past Medical History:  Diagnosis Date   Cancer (HCC)    skin   CKD (chronic kidney disease) 06/24/2013   Cochlear implant in place    Coronary artery disease    a. s/p CABG 2000; b. myoview 9/08: inf-septal scar with mild peri-infarct ischemia (low risk);  c.  Echo 8/13: mild focal basal septal hypertrophy, Gr 1 DD, mild LAE;  d. Lexiscan Myoview (06/20/2013): No reversible ischemia, inferior and septal infarct, inferior and septal hypokinesis, EF 60%;  e.  Echo (06/20/2011): EF 50-55%, normal wall motion, grade 1 diastolic dysfunction, mild LAE   Diverticular disease    Duodenitis    Elevated PSA    GERD (gastroesophageal reflux disease)    Gout    Humerus fracture    right   Hydronephrosis with ureteropelvic junction obstruction    Hypercalcemia 02/02/2012   a. 01/2012: 10.7.   Hyperlipidemia    Hypertension    Pancreatitis    a. ? Simvastatin related   Pneumonia age 50 or 7   "I think only once" (06/19/2013)   Syncope    a. 06/2013   TIA (transient ischemic attack)    a. 01/2012 - presentation as acute imbalance;  b. 01/2012 carotid U/S w/o signif stenosis;  c. 01/2012 Echo: EF 60-65%, Gr 1 DD.;  d.  Carotid US (06/20/2013): 1-39% bilateral ICA stenosis   Tubular adenoma polyp of rectum    Type II diabetes mellitus (HCC)    "diet and exercise controlled at present" (06/19/2013)   Past Surgical History:  Procedure Laterality Date   APPENDECTOMY     CARDIAC CATHETERIZATION  2000   CHOLECYSTECTOMY     COCHLEAR IMPLANT Right 1990's   COLONOSCOPY W/ POLYPECTOMY     no polyp 2012   CORONARY ARTERY BYPASS GRAFT  2000   CABG X5   INGUINAL HERNIA REPAIR Right 09/15/2015   Procedure: OPEN REPAIR RIGHT INGUINAL HERNIA ;  Surgeon: Avel Peace, MD;  Location: [facility];  Service: General;  Laterality: Right;   INSERTION OF MESH Right 09/15/2015   Procedure: INSERTION OF MESH;  Surgeon: Avel Peace, MD;   Location: [facility];  Service: General;  Laterality: Right;   PARATHYROIDECTOMY  1978   POLYPECTOMY     REVERSE SHOULDER ARTHROPLASTY Right 12/30/2014   Procedure: Right shoulder ORIF, Biomet S3 Plate and ZImmer cable system ;  Surgeon: Beverely Low, MD;  Location[facility];  Service: Orthopedics;  Laterality: Right;   right ankle surgery  age 22   TONSILLECTOMY  28   trachestomy as child for throat closing     Family History  Problem Relation Age of Onset   Diabetes Mother    Melanoma Father    Heart attack Father         in 76s   Social History   Socioeconomic History   Marital status: Widowed    Spouse name: Not on  file   Number of children: 2   Years of education: college   Highest education level: Not on file  Occupational History   Occupation: retired    Associate Professor: RETIRED  Tobacco Use   Smoking status: Former    Types: Cigars   Smokeless tobacco: Never   Tobacco comments:    06/19/2013 "rare cigar", 12/07/20 quit  Vaping Use   Vaping status: Never Used  Substance and Sexual Activity   Alcohol use: Yes    Comment: 12/07/20 1-2 a week   Drug use: No   Sexual activity: Not Currently    Birth control/protection: Injection  Other Topics Concern   Not on file  Social History Narrative   12/07/20 lives at  [facility] area   Does get regular exercise   No caffeine   Social Drivers of Corporate investment banker Strain: Low Risk  (08/16/2023)   Overall Financial Resource Strain (CARDIA)    Difficulty of Paying Living Expenses: Not hard at all  Food Insecurity: No Food Insecurity (08/16/2023)   Hunger Vital Sign    Worried About Running Out of Food in the Last Year: Never true    Ran Out of Food in the Last Year: Never true  Transportation Needs: No Transportation Needs (08/16/2023)   PRAPARE - Transportation    Lack of Transportation (Medical): No    Lack of Transportation (Non-Medical): No  Physical Activity: Insufficiently Active (08/16/2023)   Exercise Vital  Sign    Days of Exercise per Week: 7 days    Minutes of Exercise per Session: 10 min  Stress: No Stress Concern Present (08/16/2023)   [employer] of Occupational Health - Occupational Stress Questionnaire    Feeling of Stress : Not at all  Social Connections: Socially Isolated (08/16/2023)   Social Connection and Isolation Panel [NHANES]    Frequency of Communication with Friends and Family: More than three times a week    Frequency of Social Gatherings with Friends and Family: More than three times a week    Attends Religious Services: Never    Database administrator or Organizations: No    Attends Banker Meetings: Never    Marital Status: Widowed    Tobacco Counseling Counseling given: Not Answered Tobacco comments: 06/19/2013 "rare cigar", 12/07/20 quit   Clinical Intake:  Pre-visit preparation completed: No  Pain : No/denies pain     BMI - recorded: 25.16 Nutritional Status: BMI 25 -29 Overweight Nutritional Risks: None Diabetes: Yes CBG done?: No Did pt. bring in CBG monitor from home?: No  How often do you need to have someone help you when you read instructions, pamphlets, or other written materials from your doctor or pharmacy?: 2 - Rarely What is the last grade level you completed in school?: college  Interpreter Needed?: No      Activities of Daily Living    08/16/2023    2:00 PM  In your present state of health, do you have any difficulty performing the following activities:  Hearing? 1  Vision? 0  Difficulty concentrating or making decisions? 1  Walking or climbing stairs? 1  Dressing or bathing? 1  Doing errands, shopping? 1  Preparing Food and eating ? Y  Using the Toilet? Y  In the past six months, have you accidently leaked urine? Y  Do you have problems with loss of bowel control? Y  Managing your Medications? Y  Managing your Finances? Y  Housekeeping or managing your Housekeeping? Jeannie Fend  Patient Care Team: [name],  NP as PCP - General (Adult Health Nurse Practitioner) [name], MD as PCP - Cardiology (Cardiology)  Indicate any recent Medical Services you may have received from other than [facility] providers in the past year (date may be approximate).     Assessment:   This is a routine wellness examination for Danh.  Hearing/Vision screen No results found.   Goals Addressed             This Visit's Progress    Maintain Mobility and Function   On track    Evidence-based guidance:  Emphasize the importance of physical activity and aerobic exercise as included in treatment plan; assess barriers to adherence; consider patient's abilities and preferences.  Encourage gradual increase in activity or exercise instead of stopping if pain occurs.  Reinforce individual therapy exercise prescription, such as strengthening, stabilization and stretching programs.  Promote optimal body mechanics to stabilize the spine with lifting and functional activity.  Encourage activity and mobility modifications to facilitate optimal function, such as using a log roll for bed mobility or dressing from a seated position.  Reinforce individual adaptive equipment recommendations to limit excessive spinal movements, such as a Event organiser.  Assess adequacy of sleep; encourage use of sleep hygiene techniques, such as bedtime routine; use of white noise; dark, cool bedroom; avoiding daytime naps, heavy meals or exercise before bedtime.  Promote positions and modification to optimize sleep and sexual activity; consider pillows or positioning devices to assist in maintaining neutral spine.  Explore options for applying ergonomic principles at work and home, such as frequent position changes, using ergonomically designed equipment and working at optimal height.  Promote modifications to increase comfort with driving such as lumbar support, optimizing seat and steering wheel position, using cruise control and taking  frequent rest stops to stretch and walk.   Notes:        Depression Screen    08/16/2023    2:01 PM 05/27/2023   11:29 AM 02/04/2023   11:25 AM 01/25/2023    1:55 PM 08/10/2022    3:11 PM 05/23/2022    9:45 AM 01/10/2022   12:45 PM  PHQ 2/9 Scores  PHQ - 2 Score 0 0 0 0 0 0 0    Fall Risk    08/16/2023    2:02 PM 05/27/2023   11:29 AM 03/20/2023   12:07 PM 02/04/2023   11:25 AM 01/25/2023    1:55 PM  Fall Risk   Falls in the past year? 1 1 1 1 1   Number falls in past yr: 1 1 1 1  0  Injury with Fall? 1 0 1 1 0  Risk for fall due to : History of fall(s);Impaired balance/gait;Impaired mobility History of fall(s);Impaired balance/gait;Impaired mobility History of fall(s);Impaired balance/gait;Impaired mobility History of fall(s);Impaired balance/gait;Impaired mobility History of fall(s);Impaired balance/gait;Impaired mobility  Follow up Falls evaluation completed;Education provided Falls evaluation completed;Education provided;Falls prevention discussed Education provided;Falls prevention discussed Falls evaluation completed;Education provided;Falls prevention discussed Falls evaluation completed;Education provided;Falls prevention discussed    MEDICARE RISK AT HOME: Medicare Risk at Home Any stairs in or around the home?: No If so, are there any without handrails?: No Home free of loose throw rugs in walkways, pet beds, electrical cords, etc?: Yes Adequate lighting in your home to reduce risk of falls?: Yes Life alert?: No Use of a cane, walker or w/c?: Yes Grab bars in the bathroom?: Yes Shower chair or bench in shower?: Yes Elevated toilet seat or  a handicapped toilet?: Yes  TIMED UP AND GO:  Was the test performed?  No    Cognitive Function:    08/16/2023    1:53 PM 08/10/2022    3:13 PM 01/10/2022   12:48 PM 04/12/2021    1:24 PM 01/09/2021   11:55 AM  MMSE - Mini Mental State Exam  Not completed:     Unable to complete  Orientation to time 5 5 4 5    Orientation to Place  5 5 5 4    Registration 3 3 3 3    Attention/ Calculation 5 5 5 2    Recall 2 3 3 2    Language- name 2 objects 2 2 2 2    Language- repeat 1 1 1  0   Language- follow 3 step command 3 3 3 2    Language- read & follow direction 1 1 1 1    Write a sentence 1 1 1 1    Copy design 1 1 1  0   Total score 29 30 29 22          01/10/2022   12:49 PM 01/09/2021   11:56 AM  6CIT Screen  What Year? 0 points 0 points  What month? 0 points 0 points  What time? 3 points 3 points  Count back from 20 2 points 2 points  Months in reverse 0 points 2 points  Repeat phrase 2 points 6 points  Total Score 7 points 13 points    Immunizations Immunization History  Administered Date(s) Administered   Fluad Quad(high Dose 65+) 04/01/2019, 04/11/2022   Influenza Split 04/13/2011, 05/02/2012   Influenza Whole 05/22/2007, 04/12/2008, 05/02/2009, 04/14/2010   Influenza, High Dose Seasonal PF 04/17/2013, 03/16/2014, 03/05/2016, 03/18/2017, 04/17/2023   Influenza,inj,Quad PF,6+ Mos 03/02/2015, 03/18/2018   Influenza-Unspecified 03/18/2018, 04/12/2021   Moderna Covid-19 Fall Seasonal Vaccine 35yrs & older 04/24/2023   Moderna Covid-19 Vaccine Bivalent Booster 35yrs & up 11/29/2021   Moderna SARS-COV2 Booster Vaccination 03/08/2021   Moderna Sars-Covid-2 Vaccination 06/22/2019, 07/20/2019, 05/02/2020, 11/23/2020   PPD Test 01/02/2015   Pneumococcal Conjugate-13 12/29/2015   Pneumococcal Polysaccharide-23 04/19/2006   Respiratory Syncytial Virus Vaccine,Recomb Aduvanted(Arexvy) 05/14/2022   Tdap 09/04/2016   Unspecified SARS-COV-2 Vaccination 04/24/2022   Zoster Recombinant(Shingrix) 09/17/2019, 03/29/2020   Zoster, Live 06/29/2015    TDAP status: Up to date  Flu Vaccine status: Up to date  Pneumococcal vaccine status: Up to date  Covid-19 vaccine status: Completed vaccines  Qualifies for Shingles Vaccine? Yes   Zostavax completed Yes   Shingrix Completed?: Yes  Screening Tests Health Maintenance   Topic Date Due   COVID-19 Vaccine (8 - 2024-25 season) 06/19/2023   HEMOGLOBIN A1C  09/19/2023   OPHTHALMOLOGY EXAM  12/03/2023   FOOT EXAM  01/25/2024   Diabetic kidney evaluation - Urine ACR  05/05/2024   Diabetic kidney evaluation - eGFR measurement  05/20/2024   DTaP/Tdap/Td (2 - Td or Tdap) 09/05/2026   Pneumonia Vaccine 73+ Years old  Completed   INFLUENZA VACCINE  Completed   Zoster Vaccines- Shingrix  Completed   HPV VACCINES  Aged Out    Health Maintenance  Health Maintenance Due  Topic Date Due   COVID-19 Vaccine (8 - 2024-25 season) 06/19/2023    Colorectal cancer screening: No longer required.   Lung Cancer Screening: (Low Dose CT Chest recommended if Age 97-80 years, 20 pack-year currently smoking OR have quit w/in 15years.) does not qualify.   Lung Cancer Screening Referral: No  Additional Screening:  Hepatitis C Screening: does not qualify; Completed  Vision Screening: Recommended annual ophthalmology exams for early detection of glaucoma and other disorders of the eye. Is the patient up to date with their annual eye exam?  Yes  Who is the provider or what is the name of the office in which the patient attends annual eye exams? Dr. Nile Riggs If pt is not established with a provider, would they like to be referred to a provider to establish care? No .   Dental Screening: Recommended annual dental exams for proper oral hygiene  Diabetic Foot Exam: Diabetic Foot Exam: Completed 08/16/2023  Community Resource Referral / Chronic Care Management: CRR required this visit?  No   CCM required this visit?  No     Plan:     I have personally reviewed and noted the following in the patient's chart:   Medical and social history Use of alcohol, tobacco or illicit drugs  Current medications and supplements including opioid prescriptions. Patient is not currently taking opioid prescriptions. Functional ability and status Nutritional status Physical  activity Advanced directives List of other physicians Hospitalizations, surgeries, and ER visits in previous 12 months Vitals Screenings to include cognitive, depression, and falls Referrals and appointments  In addition, I have reviewed and discussed with patient certain preventive protocols, quality metrics, and best practice recommendations. A written personalized care plan for preventive services as well as general preventive health recommendations were provided to patient.     [name], NP   08/16/2023   After Visit Summary: (MyChart) Due to this being a telephonic visit, the after visit summary with patients personalized plan was offered to patient via MyChart   Nurse Notes: MMSE 29/30, needing more cueing to perform ADLs, followed by ST for cognitive exercises. Discussed benefits of Prevnar 20 with daughter, she is agreeable to vaccination. Patient had runny nose during encounter, will covid test first before administering.

## 2023-09-04 ENCOUNTER — Non-Acute Institutional Stay (SKILLED_NURSING_FACILITY): Payer: Self-pay | Admitting: Orthopedic Surgery

## 2023-09-04 ENCOUNTER — Encounter: Payer: Self-pay | Admitting: Orthopedic Surgery

## 2023-09-04 DIAGNOSIS — I10 Essential (primary) hypertension: Secondary | ICD-10-CM | POA: Diagnosis not present

## 2023-09-04 DIAGNOSIS — Z9621 Cochlear implant status: Secondary | ICD-10-CM

## 2023-09-04 DIAGNOSIS — N3944 Nocturnal enuresis: Secondary | ICD-10-CM

## 2023-09-04 DIAGNOSIS — E782 Mixed hyperlipidemia: Secondary | ICD-10-CM

## 2023-09-04 DIAGNOSIS — N401 Enlarged prostate with lower urinary tract symptoms: Secondary | ICD-10-CM

## 2023-09-04 DIAGNOSIS — N1831 Chronic kidney disease, stage 3a: Secondary | ICD-10-CM

## 2023-09-04 DIAGNOSIS — I2510 Atherosclerotic heart disease of native coronary artery without angina pectoris: Secondary | ICD-10-CM | POA: Diagnosis not present

## 2023-09-04 DIAGNOSIS — E1122 Type 2 diabetes mellitus with diabetic chronic kidney disease: Secondary | ICD-10-CM | POA: Diagnosis not present

## 2023-09-04 DIAGNOSIS — R413 Other amnesia: Secondary | ICD-10-CM

## 2023-09-04 DIAGNOSIS — N138 Other obstructive and reflux uropathy: Secondary | ICD-10-CM

## 2023-09-04 NOTE — Progress Notes (Unsigned)
 Location:  [facility] Room Number: 11/A Place of Service:  SNF (31) Provider:  [name], NP   [name], NP  Patient Care Team: [name], NP as PCP - General (Adult Health Nurse Practitioner) [name], MD as PCP - Cardiology (Cardiology)  Extended Emergency Contact Information Primary Emergency Contact: [name],[name]          [place],  [place] Home Phone: [phone] Mobile Phone: [phone] Relation: Daughter Secondary Emergency Contact: [name],[name] Address: [address]          [place], [place] [address] [name] of [place] Home Phone: [phone] Mobile Phone: [phone] Relation: Daughter  Code Status:  DNR Goals of care: Advanced Directive information    07/22/2023    3:09 PM  Advanced Directives  Does Patient Have a Medical Advance Directive? Yes  Type of Estate agent of [place];Living will;Out of facility DNR (pink MOST or yellow form)  Does patient want to make changes to medical advance directive? No - Patient declined  Copy of Healthcare Power of Attorney in Chart? Yes - validated most recent copy scanned in chart (See row information)     Chief Complaint  Patient presents with  . Medical Management of Chronic Issues    HPI:  Pt is a 88 y.o. male seen today for medical management of chronic diseases.    He currently resides on the skilled nursing unit at [facility]. PMH: CAD, s/p CABG 2000, HTN, TIA 2022, T2DM, HOH- cochlear implant, OA, BPH, CKD, memory deficit, shuffling gait, and weight loss.     T2DM- A1c 8.0 (02/06)> was 7.4 (10/03)> was 7.7 (06/21), blood sugars 100-220's, no hypoglycemias, urine microalbumin 31 04/27/2022, eye exam 12/03/2022, regular diet, remains on metformin CAD- H/o CABG, followed by cardiology, 10/2020 LVEF 60-65%, remains on plavix HTN- BUN/creat 35/1.2 05/21/2023, remains on amlodipine HLD- allergy to simvastatin, total 167, LDL  100 07/25/2023, remains on Zetia Memory deficit- BIMS score 12/15 (11/07)> was 15/15 (08/05), CT head 12/2020 atrophy and chronic microvascular ischemic changes noted, no behaviors BPH- remains on finasteride, remains on Flomax and finasteride Nocturnal enuresis- scheduled toileting at midnight and 4 am Sensory hearing loss- followed by Dr. Manson Passey, right cochlear implant in place   03/13 received Prevnar 20> tolerated well.   Recent blood pressures:  03/17- 166/96  03/11- 164/84  03/04- 148/84  Recent weights:  03/03- 170.6 lbs  02/02- 172 lbs  01/09- 168.2 lbs      Past Medical History:  Diagnosis Date  . Cancer (HCC)    skin  . CKD (chronic kidney disease) 06/24/2013  . Cochlear implant in place   . Coronary artery disease    a. s/p CABG 2000; b. myoview 9/08: inf-septal scar with mild peri-infarct ischemia (low risk);  c.  Echo 8/13: mild focal basal septal hypertrophy, Gr 1 DD, mild LAE;  d. Lexiscan Myoview (06/20/2013): No reversible ischemia, inferior and septal infarct, inferior and septal hypokinesis, EF 60%;  e.  Echo (06/20/2011): EF 50-55%, normal wall motion, grade 1 diastolic dysfunction, mild LAE  . Diverticular disease   . Duodenitis   . Elevated PSA   . GERD (gastroesophageal reflux disease)   . Gout   . Humerus fracture    right  . Hydronephrosis with ureteropelvic junction obstruction   . Hypercalcemia 02/02/2012   a. 01/2012: 10.7.  Marland Kitchen Hyperlipidemia   . Hypertension   . Pancreatitis    a. ? Simvastatin related  .  Pneumonia age 10 or 7   "I think only once" (06/19/2013)  . Syncope    a. 06/2013  . TIA (transient ischemic attack)    a. 01/2012 - presentation as acute imbalance;  b. 01/2012 carotid U/S w/o signif stenosis;  c. 01/2012 Echo: EF 60-65%, Gr 1 DD.;  d.  Carotid US (06/20/2013): 1-39% bilateral ICA stenosis  . Tubular adenoma polyp of rectum   . Type II diabetes mellitus (HCC)    "diet and exercise controlled at present" (06/19/2013)   Past Surgical  History:  Procedure Laterality Date  . APPENDECTOMY    . CARDIAC CATHETERIZATION  2000  . CHOLECYSTECTOMY    . COCHLEAR IMPLANT Right 1990's  . COLONOSCOPY W/ POLYPECTOMY     no polyp 2012  . CORONARY ARTERY BYPASS GRAFT  2000   CABG X5  . INGUINAL HERNIA REPAIR Right 09/15/2015   Procedure: OPEN REPAIR RIGHT INGUINAL HERNIA ;  Surgeon: Avel Peace, MD;  Location: [facility];  Service: General;  Laterality: Right;  . INSERTION OF MESH Right 09/15/2015   Procedure: INSERTION OF MESH;  Surgeon: Avel Peace, MD;  Location: [facility];  Service: General;  Laterality: Right;  . PARATHYROIDECTOMY  1978  . POLYPECTOMY    . REVERSE SHOULDER ARTHROPLASTY Right 12/30/2014   Procedure: Right shoulder ORIF, Biomet S3 Plate and ZImmer cable system ;  Surgeon: Beverely Low, MD;  Location: [facility] OR;  Service: Orthopedics;  Laterality: Right;  . right ankle surgery  age 83  . TONSILLECTOMY  1943  . trachestomy as child for throat closing      Allergies  Allergen Reactions  . Simvastatin Other (See Comments)    Possible statin-induced pancreatitis    Outpatient Encounter Medications as of 09/04/2023  Medication Sig  . acetaminophen (TYLENOL) 500 MG tablet Take 1,000 mg by mouth 2 (two) times daily.  Marland Kitchen allopurinol (ZYLOPRIM) 100 MG tablet Take 100 mg by mouth daily.  . Choline Fenofibrate (FENOFIBRIC ACID) 135 MG CPDR Take 1 tablet by mouth in the morning.  . clopidogrel (PLAVIX) 75 MG tablet Take 75 mg by mouth daily.  . colchicine 0.6 MG tablet Take 0.6 mg by mouth daily as needed (as directed for gout flares).  . ezetimibe (ZETIA) 10 MG tablet Take 10 mg by mouth daily.  . finasteride (PROSCAR) 5 MG tablet Take 5 mg by mouth every evening.  Marland Kitchen guaiFENesin-dextromethorphan (ROBITUSSIN DM) 100-10 MG/5ML syrup Take 10 mLs by mouth every 6 (six) hours as needed for cough.  Marland Kitchen ipratropium-albuterol (DUONEB) 0.5-2.5 (3) MG/3ML SOLN Take 3 mLs by nebulization every 6 (six) hours as needed.  Marland Kitchen lisinopril  (ZESTRIL) 10 MG tablet Take 1 tablet (10 mg total) by mouth daily.  . metFORMIN (GLUCOPHAGE) 500 MG tablet Take 500 mg by mouth every morning.  . metFORMIN (GLUCOPHAGE) 500 MG tablet Take 500 mg by mouth every evening.  . miconazole (MICOTIN) 2 % powder Apply 1 Application topically daily at 12 noon.  . polyethylene glycol powder (GLYCOLAX/MIRALAX) 17 GM/SCOOP powder Take 17 g by mouth 2 (two) times a week.  . tamsulosin (FLOMAX) 0.4 MG CAPS capsule Take 0.4 mg by mouth at bedtime.  Marland Kitchen zinc oxide 20 % ointment Apply 1 application  topically as needed for irritation. Apply transdermally as needed for skin irritation after each incontinent episode.   No facility-administered encounter medications on file as of 09/04/2023.    Review of Systems  Constitutional:  Negative for fatigue and fever.  HENT:  Positive for hearing  loss. Negative for sore throat and trouble swallowing.   Eyes:  Negative for visual disturbance.  Respiratory:  Negative for cough and shortness of breath.   Cardiovascular:  Negative for chest pain and leg swelling.  Gastrointestinal:  Negative for abdominal distention and abdominal pain.  Genitourinary:  Positive for frequency. Negative for dysuria and hematuria.       Nocturia  Musculoskeletal:  Positive for gait problem.  Skin:  Negative for wound.  Neurological:  Positive for weakness. Negative for dizziness and headaches.  Psychiatric/Behavioral:  Positive for confusion. Negative for dysphoric mood and sleep disturbance. The patient is not nervous/anxious.     Immunization History  Administered Date(s) Administered  . Fluad Quad(high Dose 65+) 04/01/2019, 04/11/2022  . Influenza Split 04/13/2011, 05/02/2012  . Influenza Whole 05/22/2007, 04/12/2008, 05/02/2009, 04/14/2010  . Influenza, High Dose Seasonal PF 04/17/2013, 03/16/2014, 03/05/2016, 03/18/2017, 04/17/2023  . Influenza,inj,Quad PF,6+ Mos 03/02/2015, 03/18/2018  . Influenza-Unspecified 03/18/2018, 04/12/2021   . Moderna Covid-19 Fall Seasonal Vaccine 69yrs & older 04/24/2023  . Moderna Covid-19 Vaccine Bivalent Booster 37yrs & up 11/29/2021  . Moderna SARS-COV2 Booster Vaccination 03/08/2021  . Moderna Sars-Covid-2 Vaccination 06/22/2019, 07/20/2019, 05/02/2020, 11/23/2020  . PPD Test 01/02/2015  . Pneumococcal Conjugate-13 12/29/2015  . Pneumococcal Polysaccharide-23 04/19/2006  . Respiratory Syncytial Virus Vaccine,Recomb Aduvanted(Arexvy) 05/14/2022  . Tdap 09/04/2016  . Unspecified SARS-COV-2 Vaccination 04/24/2022  . Zoster Recombinant(Shingrix) 09/17/2019, 03/29/2020  . Zoster, Live 06/29/2015   Pertinent  Health Maintenance Due  Topic Date Due  . HEMOGLOBIN A1C  09/19/2023  . OPHTHALMOLOGY EXAM  12/03/2023  . FOOT EXAM  08/15/2024  . INFLUENZA VACCINE  Completed      01/25/2023    1:55 PM 02/04/2023   11:25 AM 03/20/2023   12:07 PM 05/27/2023   11:29 AM 08/16/2023    2:02 PM  Fall Risk  Falls in the past year? 1 1 1 1 1   Was there an injury with Fall? 0 1 1 0 1  Fall Risk Category Calculator 1 3 3 2 3   Patient at Risk for Falls Due to History of fall(s);Impaired balance/gait;Impaired mobility History of fall(s);Impaired balance/gait;Impaired mobility History of fall(s);Impaired balance/gait;Impaired mobility History of fall(s);Impaired balance/gait;Impaired mobility History of fall(s);Impaired balance/gait;Impaired mobility  Fall risk Follow up Falls evaluation completed;Education provided;Falls prevention discussed Falls evaluation completed;Education provided;Falls prevention discussed Education provided;Falls prevention discussed Falls evaluation completed;Education provided;Falls prevention discussed Falls evaluation completed;Education provided   Functional Status Survey:    Vitals:   09/04/23 1558  BP: (!) 166/96  Pulse: 87  Resp: 20  Temp: 97.8 F (36.6 C)  SpO2: 93%  Weight: 169 lb (76.7 kg)  Height: 5\' 9"  (1.753 m)   Body mass index is 24.96 kg/m. Physical  Exam Vitals reviewed.  Constitutional:      General: He is not in acute distress. HENT:     Head: Normocephalic.     Right Ear: Tympanic membrane normal.     Left Ear: Tympanic membrane normal.     Ears:     Comments: Right cochlear implant    Nose: Nose normal.     Mouth/Throat:     Mouth: Mucous membranes are moist.  Eyes:     General:        Right eye: No discharge.        Left eye: No discharge.  Cardiovascular:     Rate and Rhythm: Normal rate and regular rhythm.     Pulses: Normal pulses.     Heart sounds: Normal heart  sounds.  Pulmonary:     Effort: Pulmonary effort is normal.     Breath sounds: Normal breath sounds.  Abdominal:     General: Bowel sounds are normal.     Palpations: Abdomen is soft.  Musculoskeletal:     Cervical back: Neck supple.     Right lower leg: No edema.     Left lower leg: No edema.  Skin:    General: Skin is warm.     Capillary Refill: Capillary refill takes less than 2 seconds.  Neurological:     General: No focal deficit present.     Mental Status: He is alert. Mental status is at baseline.     Motor: Weakness present.     Gait: Gait abnormal.     Comments: W/c  Psychiatric:        Mood and Affect: Mood normal.    Labs reviewed: Recent Labs    09/11/22 0000 05/06/23 0000 05/21/23 0000  NA 142 140 141  K 4.4 4.1 4.2  CL 107 107 107  CO2 26* 26* 28*  BUN 33* 34* 35*  CREATININE 1.4* 1.2 1.2  CALCIUM 10.7 10.6 10.4   Recent Labs    09/11/22 0000 05/06/23 0000  AST 17 16  ALT 15 12  ALKPHOS 35 35  ALBUMIN 4.3 3.9   Recent Labs    09/11/22 0000 09/24/22 0000 05/06/23 0000  WBC 4.6 6.5 5.7  NEUTROABS 2,470.00 3,907.00 3,340.00  HGB 13.6 13.9 12.2*  HCT 39* 40* 37*  PLT 261 282  --    Lab Results  Component Value Date   TSH 2.15 12/07/2022   Lab Results  Component Value Date   HGBA1C 7.4 03/21/2023   Lab Results  Component Value Date   CHOL 212 (A) 09/11/2022   HDL 41 09/11/2022   LDLCALC 140  09/11/2022   LDLDIRECT 96.0 08/23/2020   TRIG 174 (A) 09/11/2022   CHOLHDL 4.4 10/21/2020    Significant Diagnostic Results in last 30 days:  No results found.  Assessment/Plan 1. Type 2 diabetes mellitus with stage 3a chronic kidney disease, without long-term current use of insulin (HCC) (Primary) ***  2. Atherosclerosis of native coronary artery of native heart without angina pectoris ***  3. Essential hypertension, benign ***  4. Mixed hyperlipidemia ***  5. Memory deficit ***  6. Benign prostatic hyperplasia with urinary obstruction ***  7. Nocturnal enuresis ***  8. Cochlear implant in place ***    Family/ staff Communication: ***  Labs/tests ordered:  ***

## 2023-09-12 ENCOUNTER — Encounter: Payer: Self-pay | Admitting: Internal Medicine

## 2023-09-12 ENCOUNTER — Non-Acute Institutional Stay: Payer: Self-pay | Admitting: Internal Medicine

## 2023-09-12 DIAGNOSIS — R296 Repeated falls: Secondary | ICD-10-CM

## 2023-09-12 DIAGNOSIS — W19XXXA Unspecified fall, initial encounter: Secondary | ICD-10-CM

## 2023-09-12 DIAGNOSIS — R2681 Unsteadiness on feet: Secondary | ICD-10-CM | POA: Diagnosis not present

## 2023-09-12 NOTE — Progress Notes (Unsigned)
 Location:  [facility] Room Number: N11-A Place of Service:  SNF (31) Provider:  Einar Crow L,MD  [name], NP  Patient Care Team: [name], NP as PCP - General (Adult Health Nurse Practitioner) [name], MD as PCP - Cardiology (Cardiology)  Extended Emergency Contact Information Primary Emergency Contact: [name],[name]          [place], [place] Home Phone: [phone] Mobile Phone: [phone] Relation: Daughter Secondary Emergency Contact: [name],[name] Address: [address]          [place], [place] [address] [name] of [place] Home Phone: [phone] Mobile Phone: [phone] Relation: Daughter  Code Status:  DNR Goals of care: Advanced Directive information    09/12/2023    4:02 PM  Advanced Directives  Does Patient Have a Medical Advance Directive? Yes  Type of Estate agent of [place];Living will;Out of facility DNR (pink MOST or yellow form)  Does patient want to make changes to medical advance directive? No - Patient declined  Copy of Healthcare Power of Attorney in Chart? Yes - validated most recent copy scanned in chart (See row information)     Chief Complaint  Patient presents with   Acute Visit    Fall    HPI:  Pt is a 88 y.o. male seen today for an acute visit for Fall  Lives in SNF   Patient has a history of CAD, S/P CABG in 2000, gout, HLD, hypertension and diabetes Unstable Gait No further work up per Neurology Has Auditory Implants  Called to his room as patient fell and now has hematoma on top of his head with small skin shear which was bleeding Patient was napping in afternoon when he got up and fell Hit his head Bleeding from the shear Was alert and able to follow all commands      Past Medical History:  Diagnosis Date   Cancer (HCC)    skin   CKD (chronic kidney disease) 06/24/2013   Cochlear implant in place    Coronary artery disease     a. s/p CABG 2000; b. myoview 9/08: inf-septal scar with mild peri-infarct ischemia (low risk);  c.  Echo 8/13: mild focal basal septal hypertrophy, Gr 1 DD, mild LAE;  d. Lexiscan Myoview (06/20/2013): No reversible ischemia, inferior and septal infarct, inferior and septal hypokinesis, EF 60%;  e.  Echo (06/20/2011): EF 50-55%, normal wall motion, grade 1 diastolic dysfunction, mild LAE   Diverticular disease    Duodenitis    Elevated PSA    GERD (gastroesophageal reflux disease)    Gout    Humerus fracture    right   Hydronephrosis with ureteropelvic junction obstruction    Hypercalcemia 02/02/2012   a. 01/2012: 10.7.   Hyperlipidemia    Hypertension    Pancreatitis    a. ? Simvastatin related   Pneumonia age 64 or 7   "I think only once" (06/19/2013)   Syncope    a. 06/2013   TIA (transient ischemic attack)    a. 01/2012 - presentation as acute imbalance;  b. 01/2012 carotid U/S w/o signif stenosis;  c. 01/2012 Echo: EF 60-65%, Gr 1 DD.;  d.  Carotid US (06/20/2013): 1-39% bilateral ICA stenosis   Tubular adenoma polyp of rectum    Type II diabetes mellitus (HCC)    "diet and exercise controlled at present" (06/19/2013)   Past Surgical History:  Procedure Laterality Date   APPENDECTOMY     CARDIAC  CATHETERIZATION  2000   CHOLECYSTECTOMY     COCHLEAR IMPLANT Right 1990's   COLONOSCOPY W/ POLYPECTOMY     no polyp 2012   CORONARY ARTERY BYPASS GRAFT  2000   CABG X5   INGUINAL HERNIA REPAIR Right 09/15/2015   Procedure: OPEN REPAIR RIGHT INGUINAL HERNIA ;  Surgeon: Avel Peace, MD;  Location: [facility];  Service: General;  Laterality: Right;   INSERTION OF MESH Right 09/15/2015   Procedure: INSERTION OF MESH;  Surgeon: Avel Peace, MD;  Location: [facility];  Service: General;  Laterality: Right;   PARATHYROIDECTOMY  1978   POLYPECTOMY     REVERSE SHOULDER ARTHROPLASTY Right 12/30/2014   Procedure: Right shoulder ORIF, Biomet S3 Plate and ZImmer cable system ;  Surgeon: Beverely Low, MD;   Location: [facility] OR;  Service: Orthopedics;  Laterality: Right;   right ankle surgery  age 23   TONSILLECTOMY  78   trachestomy as child for throat closing      Allergies  Allergen Reactions   Simvastatin Other (See Comments)    Possible statin-induced pancreatitis    Outpatient Encounter Medications as of 09/12/2023  Medication Sig   acetaminophen (TYLENOL) 500 MG tablet Take 1,000 mg by mouth 2 (two) times daily.   allopurinol (ZYLOPRIM) 100 MG tablet Take 100 mg by mouth daily.   Choline Fenofibrate (FENOFIBRIC ACID) 135 MG CPDR Take 1 tablet by mouth in the morning.   clopidogrel (PLAVIX) 75 MG tablet Take 75 mg by mouth daily.   colchicine 0.6 MG tablet Take 0.6 mg by mouth daily as needed (as directed for gout flares).   ezetimibe (ZETIA) 10 MG tablet Take 10 mg by mouth daily.   finasteride (PROSCAR) 5 MG tablet Take 5 mg by mouth every evening.   guaiFENesin-dextromethorphan (ROBITUSSIN DM) 100-10 MG/5ML syrup Take 10 mLs by mouth every 6 (six) hours as needed for cough.   ipratropium-albuterol (DUONEB) 0.5-2.5 (3) MG/3ML SOLN Take 3 mLs by nebulization every 6 (six) hours as needed.   lisinopril (ZESTRIL) 10 MG tablet Take 1 tablet (10 mg total) by mouth daily.   metFORMIN (GLUCOPHAGE) 500 MG tablet Take 1,000 mg by mouth every morning.   metFORMIN (GLUCOPHAGE) 500 MG tablet Take 500 mg by mouth every evening.   miconazole (MICOTIN) 2 % powder Apply 1 Application topically daily at 12 noon.   polyethylene glycol powder (GLYCOLAX/MIRALAX) 17 GM/SCOOP powder Take 17 g by mouth 2 (two) times a week.   tamsulosin (FLOMAX) 0.4 MG CAPS capsule Take 0.4 mg by mouth at bedtime.   zinc oxide 20 % ointment Apply 1 application  topically as needed for irritation. Apply transdermally as needed for skin irritation after each incontinent episode.   No facility-administered encounter medications on file as of 09/12/2023.    Review of Systems  Constitutional:  Negative for activity change,  appetite change and unexpected weight change.  HENT: Negative.    Respiratory:  Negative for cough and shortness of breath.   Cardiovascular:  Negative for leg swelling.  Gastrointestinal:  Negative for constipation.  Genitourinary:  Negative for frequency.  Musculoskeletal:  Positive for gait problem. Negative for arthralgias and myalgias.  Skin: Negative.  Negative for rash.  Neurological:  Negative for dizziness and weakness.  Psychiatric/Behavioral:  Negative for confusion and sleep disturbance.   All other systems reviewed and are negative.   Immunization History  Administered Date(s) Administered   Fluad Quad(high Dose 65+) 04/01/2019, 04/11/2022   Influenza Split 04/13/2011, 05/02/2012   Influenza Whole  05/22/2007, 04/12/2008, 05/02/2009, 04/14/2010   Influenza, High Dose Seasonal PF 04/17/2013, 03/16/2014, 03/05/2016, 03/18/2017, 04/17/2023   Influenza,inj,Quad PF,6+ Mos 03/02/2015, 03/18/2018   Influenza-Unspecified 03/18/2018, 04/12/2021   Moderna Covid-19 Fall Seasonal Vaccine 36yrs & older 04/24/2023   Moderna Covid-19 Vaccine Bivalent Booster 30yrs & up 11/29/2021   Moderna SARS-COV2 Booster Vaccination 03/08/2021   Moderna Sars-Covid-2 Vaccination 06/22/2019, 07/20/2019, 05/02/2020, 11/23/2020   PNEUMOCOCCAL CONJUGATE-20 08/29/2023   PPD Test 01/02/2015   Pneumococcal Conjugate-13 12/29/2015   Pneumococcal Polysaccharide-23 04/19/2006   Respiratory Syncytial Virus Vaccine,Recomb Aduvanted(Arexvy) 05/14/2022   Tdap 09/04/2016   Unspecified SARS-COV-2 Vaccination 04/24/2022   Zoster Recombinant(Shingrix) 09/17/2019, 03/29/2020   Zoster, Live 06/29/2015   Pertinent  Health Maintenance Due  Topic Date Due   OPHTHALMOLOGY EXAM  12/03/2023   HEMOGLOBIN A1C  01/23/2024   FOOT EXAM  08/15/2024   INFLUENZA VACCINE  Completed      01/25/2023    1:55 PM 02/04/2023   11:25 AM 03/20/2023   12:07 PM 05/27/2023   11:29 AM 08/16/2023    2:02 PM  Fall Risk  Falls in the past  year? 1 1 1 1 1   Was there an injury with Fall? 0 1 1 0 1  Fall Risk Category Calculator 1 3 3 2 3   Patient at Risk for Falls Due to History of fall(s);Impaired balance/gait;Impaired mobility History of fall(s);Impaired balance/gait;Impaired mobility History of fall(s);Impaired balance/gait;Impaired mobility History of fall(s);Impaired balance/gait;Impaired mobility History of fall(s);Impaired balance/gait;Impaired mobility  Fall risk Follow up Falls evaluation completed;Education provided;Falls prevention discussed Falls evaluation completed;Education provided;Falls prevention discussed Education provided;Falls prevention discussed Falls evaluation completed;Education provided;Falls prevention discussed Falls evaluation completed;Education provided   Functional Status Survey:    Vitals:   09/12/23 1600  BP: 139/75  Pulse: 78  Resp: 18  Temp: (!) 96.9 F (36.1 C)  SpO2: 95%  Weight: 169 lb 6.4 oz (76.8 kg)  Height: 5\' 9"  (1.753 m)   Body mass index is 25.02 kg/m. Physical Exam Vitals reviewed.  Constitutional:      Appearance: Normal appearance.  HENT:     Head: Normocephalic.     Comments: Hematoma on top of his head Skin shear which was bleeding    Nose: Nose normal.     Mouth/Throat:     Mouth: Mucous membranes are moist.     Pharynx: Oropharynx is clear.  Eyes:     Pupils: Pupils are equal, round, and reactive to light.  Cardiovascular:     Rate and Rhythm: Normal rate and regular rhythm.     Pulses: Normal pulses.     Heart sounds: No murmur heard. Pulmonary:     Effort: Pulmonary effort is normal. No respiratory distress.     Breath sounds: Normal breath sounds. No rales.  Abdominal:     General: Abdomen is flat. Bowel sounds are normal.     Palpations: Abdomen is soft.  Musculoskeletal:        General: No swelling.     Cervical back: Neck supple.  Skin:    General: Skin is warm.  Neurological:     General: No focal deficit present.     Mental Status: He is  alert.     Comments: Did not have any acute change Mental status at baseline Able to follow all commands  Psychiatric:        Mood and Affect: Mood normal.        Thought Content: Thought content normal.     Labs reviewed: Recent Labs  05/06/23 0000 05/21/23 0000  NA 140 141  K 4.1 4.2  CL 107 107  CO2 26* 28*  BUN 34* 35*  CREATININE 1.2 1.2  CALCIUM 10.6 10.4   Recent Labs    05/06/23 0000  AST 16  ALT 12  ALKPHOS 35  ALBUMIN 3.9   Recent Labs    09/24/22 0000 05/06/23 0000  WBC 6.5 5.7  NEUTROABS 3,907.00 3,340.00  HGB 13.9 12.2*  HCT 40* 37*  PLT 282  --    Lab Results  Component Value Date   TSH 2.15 12/07/2022   Lab Results  Component Value Date   HGBA1C 8.0 07/26/2023   Lab Results  Component Value Date   CHOL 167 07/26/2023   HDL 40 07/26/2023   LDLCALC 100 07/26/2023   LDLDIRECT 96.0 08/23/2020   TRIG 177 (A) 07/26/2023   CHOLHDL 4.4 10/21/2020    Significant Diagnostic Results in last 30 days:  No results found.  Assessment/Plan Fall With Injury Patient sustained a Hematoma on his head No Focal deficits His wound was cleaned. Does not need stiches Apply Foam dressing and will do Neuro checks D/w the nursing staff and Patient again for calling for assist   Family/ staff Communication:   Labs/tests ordered:

## 2023-09-23 ENCOUNTER — Encounter: Payer: Self-pay | Admitting: Orthopedic Surgery

## 2023-09-23 ENCOUNTER — Non-Acute Institutional Stay (SKILLED_NURSING_FACILITY): Payer: Self-pay | Admitting: Orthopedic Surgery

## 2023-09-23 DIAGNOSIS — S0083XA Contusion of other part of head, initial encounter: Secondary | ICD-10-CM

## 2023-09-23 DIAGNOSIS — R296 Repeated falls: Secondary | ICD-10-CM | POA: Diagnosis not present

## 2023-09-23 DIAGNOSIS — N401 Enlarged prostate with lower urinary tract symptoms: Secondary | ICD-10-CM

## 2023-09-23 DIAGNOSIS — R413 Other amnesia: Secondary | ICD-10-CM

## 2023-09-23 DIAGNOSIS — R350 Frequency of micturition: Secondary | ICD-10-CM

## 2023-09-23 NOTE — Progress Notes (Signed)
 Location:  [facility] Room Number: 11/A Place of Service:  SNF (31) Provider:  [name], NP   [name], NP  Patient Care Team: [name], NP as PCP - General (Adult Health Nurse Practitioner) [name], MD as PCP - Cardiology (Cardiology)  Extended Emergency Contact Information Primary Emergency Contact: [name],[name]          [place], [place] Home Phone: [phone] Mobile Phone: [phone] Relation: Daughter Secondary Emergency Contact: [name],[name] Address: [address]          [place], [place] [address] [name] Home Phone: [phone] Mobile Phone: [phone] Relation: Daughter  Code Status:  DNR Goals of care: Advanced Directive information    09/12/2023    4:02 PM  Advanced Directives  Does Patient Have a Medical Advance Directive? Yes  Type of Estate agent of [place];Living will;Out of facility DNR (pink MOST or yellow form)  Does patient want to make changes to medical advance directive? No - Patient declined  Copy of Healthcare Power of Attorney in Chart? Yes - validated most recent copy scanned in chart (See row information)     Chief Complaint  Patient presents with   Acute Visit    HPI:  Pt is a 88 y.o. male seen today for acute visit due to frequent falls.   He currently resides on the skilled nursing unit at [facility]. PMH: CAD, s/p CABG 2000, HTN, TIA 2022, T2DM, HOH- cochlear implant, OA, BPH, CKD, memory deficit, and unstable gait.   03/27 he was found on the floor with hematoma to head. Neuro checks stable after incident. 04/05 he was found on the floor by staff and soiled in urine and feces. Bruise was noted to left buttocks. Neuro checks have been normal since accident. Recent BIMS 14/15. He does not always use call bell before getting up. He does have signs posted in his room to remind him to use call bell. He also has a chair alarm. He  has had issues with urinary incontinence for awhile. He is currently taking finasteride and tamsulosin for BPH. He denies chest pain, sob, generalized pain, dizziness or dysuria. He does admit to urinary frequency today. Afebrile. Vitals stable.     Past Medical History:  Diagnosis Date   Cancer (HCC)    skin   CKD (chronic kidney disease) 06/24/2013   Cochlear implant in place    Coronary artery disease    a. s/p CABG 2000; b. myoview 9/08: inf-septal scar with mild peri-infarct ischemia (low risk);  c.  Echo 8/13: mild focal basal septal hypertrophy, Gr 1 DD, mild LAE;  d. Lexiscan Myoview (06/20/2013): No reversible ischemia, inferior and septal infarct, inferior and septal hypokinesis, EF 60%;  e.  Echo (06/20/2011): EF 50-55%, normal wall motion, grade 1 diastolic dysfunction, mild LAE   Diverticular disease    Duodenitis    Elevated PSA    GERD (gastroesophageal reflux disease)    Gout    Humerus fracture    right   Hydronephrosis with ureteropelvic junction obstruction    Hypercalcemia 02/02/2012   a. 01/2012: 10.7.   Hyperlipidemia    Hypertension    Pancreatitis    a. ? Simvastatin related   Pneumonia age 61 or 7   "I think only once" (06/19/2013)   Syncope    a. 06/2013   TIA (transient ischemic attack)    a. 01/2012 - presentation as acute imbalance;  b. 01/2012 carotid U/S w/o signif stenosis;  c. 01/2012 Echo: EF 60-65%, Gr 1 DD.;  d.  Carotid US (06/20/2013): 1-39% bilateral ICA stenosis   Tubular adenoma polyp of rectum    Type II diabetes mellitus (HCC)    "diet and exercise controlled at present" (06/19/2013)   Past Surgical History:  Procedure Laterality Date   APPENDECTOMY     CARDIAC CATHETERIZATION  2000   CHOLECYSTECTOMY     COCHLEAR IMPLANT Right 1990's   COLONOSCOPY W/ POLYPECTOMY     no polyp 2012   CORONARY ARTERY BYPASS GRAFT  2000   CABG X5   INGUINAL HERNIA REPAIR Right 09/15/2015   Procedure: OPEN REPAIR RIGHT INGUINAL HERNIA ;  Surgeon: Avel Peace,  MD;  Location: [facility] ORS;  Service: General;  Laterality: Right;   INSERTION OF MESH Right 09/15/2015   Procedure: INSERTION OF MESH;  Surgeon: Avel Peace, MD;  Location: [facility];  Service: General;  Laterality: Right;   PARATHYROIDECTOMY  1978   POLYPECTOMY     REVERSE SHOULDER ARTHROPLASTY Right 12/30/2014   Procedure: Right shoulder ORIF, Biomet S3 Plate and ZImmer cable system ;  Surgeon: Beverely Low, MD;  Location: [facility] OR;  Service: Orthopedics;  Laterality: Right;   right ankle surgery  age 80   TONSILLECTOMY  40   trachestomy as child for throat closing      Allergies  Allergen Reactions   Simvastatin Other (See Comments)    Possible statin-induced pancreatitis    Outpatient Encounter Medications as of 09/23/2023  Medication Sig   acetaminophen (TYLENOL) 500 MG tablet Take 1,000 mg by mouth 2 (two) times daily.   allopurinol (ZYLOPRIM) 100 MG tablet Take 100 mg by mouth daily.   Choline Fenofibrate (FENOFIBRIC ACID) 135 MG CPDR Take 1 tablet by mouth in the morning.   clopidogrel (PLAVIX) 75 MG tablet Take 75 mg by mouth daily.   colchicine 0.6 MG tablet Take 0.6 mg by mouth daily as needed (as directed for gout flares).   ezetimibe (ZETIA) 10 MG tablet Take 10 mg by mouth daily.   finasteride (PROSCAR) 5 MG tablet Take 5 mg by mouth every evening.   guaiFENesin-dextromethorphan (ROBITUSSIN DM) 100-10 MG/5ML syrup Take 10 mLs by mouth every 6 (six) hours as needed for cough.   ipratropium-albuterol (DUONEB) 0.5-2.5 (3) MG/3ML SOLN Take 3 mLs by nebulization every 6 (six) hours as needed.   lisinopril (ZESTRIL) 10 MG tablet Take 1 tablet (10 mg total) by mouth daily.   metFORMIN (GLUCOPHAGE) 500 MG tablet Take 1,000 mg by mouth every morning.   metFORMIN (GLUCOPHAGE) 500 MG tablet Take 500 mg by mouth every evening.   miconazole (MICOTIN) 2 % powder Apply 1 Application topically daily at 12 noon.   polyethylene glycol powder (GLYCOLAX/MIRALAX) 17 GM/SCOOP powder Take 17 g by  mouth 2 (two) times a week.   tamsulosin (FLOMAX) 0.4 MG CAPS capsule Take 0.4 mg by mouth at bedtime.   zinc oxide 20 % ointment Apply 1 application  topically as needed for irritation. Apply transdermally as needed for skin irritation after each incontinent episode.   No facility-administered encounter medications on file as of 09/23/2023.    Review of Systems  Constitutional:  Negative for fatigue and fever.  HENT:  Positive for hearing loss. Negative for trouble swallowing.   Eyes:  Negative for visual disturbance.  Respiratory:  Negative for cough and shortness of breath.   Cardiovascular:  Negative for chest pain and leg swelling.  Gastrointestinal:  Negative for abdominal distention and abdominal pain.  Genitourinary:  Positive for frequency and urgency. Negative for dysuria and hematuria.  Musculoskeletal:  Positive for gait problem.  Skin:  Positive for wound.  Neurological:  Positive for weakness. Negative for dizziness and light-headedness.  Psychiatric/Behavioral:  Positive for confusion. Negative for dysphoric mood and sleep disturbance. The patient is not nervous/anxious.     Immunization History  Administered Date(s) Administered   Fluad Quad(high Dose 65+) 04/01/2019, 04/11/2022   Influenza Split 04/13/2011, 05/02/2012   Influenza Whole 05/22/2007, 04/12/2008, 05/02/2009, 04/14/2010   Influenza, High Dose Seasonal PF 04/17/2013, 03/16/2014, 03/05/2016, 03/18/2017, 04/17/2023   Influenza,inj,Quad PF,6+ Mos 03/02/2015, 03/18/2018   Influenza-Unspecified 03/18/2018, 04/12/2021   Moderna Covid-19 Fall Seasonal Vaccine 45yrs & older 04/24/2023   Moderna Covid-19 Vaccine Bivalent Booster 11yrs & up 11/29/2021   Moderna SARS-COV2 Booster Vaccination 03/08/2021   Moderna Sars-Covid-2 Vaccination 06/22/2019, 07/20/2019, 05/02/2020, 11/23/2020   PNEUMOCOCCAL CONJUGATE-20 08/29/2023   PPD Test 01/02/2015   Pneumococcal Conjugate-13 12/29/2015   Pneumococcal Polysaccharide-23  04/19/2006   Respiratory Syncytial Virus Vaccine,Recomb Aduvanted(Arexvy) 05/14/2022   Tdap 09/04/2016   Unspecified SARS-COV-2 Vaccination 04/24/2022   Zoster Recombinant(Shingrix) 09/17/2019, 03/29/2020   Zoster, Live 06/29/2015   Pertinent  Health Maintenance Due  Topic Date Due   OPHTHALMOLOGY EXAM  12/03/2023   INFLUENZA VACCINE  01/17/2024   HEMOGLOBIN A1C  01/23/2024   FOOT EXAM  08/15/2024      01/25/2023    1:55 PM 02/04/2023   11:25 AM 03/20/2023   12:07 PM 05/27/2023   11:29 AM 08/16/2023    2:02 PM  Fall Risk  Falls in the past year? 1 1 1 1 1   Was there an injury with Fall? 0 1 1 0 1  Fall Risk Category Calculator 1 3 3 2 3   Patient at Risk for Falls Due to History of fall(s);Impaired balance/gait;Impaired mobility History of fall(s);Impaired balance/gait;Impaired mobility History of fall(s);Impaired balance/gait;Impaired mobility History of fall(s);Impaired balance/gait;Impaired mobility History of fall(s);Impaired balance/gait;Impaired mobility  Fall risk Follow up Falls evaluation completed;Education provided;Falls prevention discussed Falls evaluation completed;Education provided;Falls prevention discussed Education provided;Falls prevention discussed Falls evaluation completed;Education provided;Falls prevention discussed Falls evaluation completed;Education provided   Functional Status Survey:    Vitals:   09/23/23 1014  BP: (!) 153/88  Pulse: 95  Resp: 18  Temp: 98.2 F (36.8 C)  SpO2: 96%  Weight: 169 lb 12.8 oz (77 kg)  Height: 5\' 9"  (1.753 m)   Body mass index is 25.08 kg/m. Physical Exam Vitals reviewed.  Constitutional:      General: He is not in acute distress. HENT:     Head: Normocephalic. Laceration present.     Comments: Small linear scab to occipital>almost healed, no erythema/swelling or tenderness Eyes:     General:        Right eye: No discharge.        Left eye: No discharge.  Cardiovascular:     Rate and Rhythm: Normal rate and  regular rhythm.     Pulses: Normal pulses.     Heart sounds: Normal heart sounds.  Pulmonary:     Effort: Pulmonary effort is normal. No respiratory distress.     Breath sounds: Normal breath sounds. No wheezing or rales.  Abdominal:     General: Bowel sounds are normal. There is no distension.     Palpations: Abdomen is soft.     Tenderness: There is no abdominal tenderness. There is no right CVA tenderness or left CVA tenderness.  Musculoskeletal:  Cervical back: Neck supple.     Right lower leg: No edema.     Left lower leg: No edema.  Skin:    General: Skin is warm.     Capillary Refill: Capillary refill takes less than 2 seconds.  Neurological:     General: No focal deficit present.     Mental Status: He is alert and oriented to person, place, and time.     Motor: Weakness present.     Gait: Gait abnormal.  Psychiatric:        Mood and Affect: Mood normal.     Comments: Followed commands, gave short answers, trouble giving detailed responses     Labs reviewed: Recent Labs    05/06/23 0000 05/21/23 0000  NA 140 141  K 4.1 4.2  CL 107 107  CO2 26* 28*  BUN 34* 35*  CREATININE 1.2 1.2  CALCIUM 10.6 10.4   Recent Labs    05/06/23 0000  AST 16  ALT 12  ALKPHOS 35  ALBUMIN 3.9   Recent Labs    09/24/22 0000 05/06/23 0000  WBC 6.5 5.7  NEUTROABS 3,907.00 3,340.00  HGB 13.9 12.2*  HCT 40* 37*  PLT 282  --    Lab Results  Component Value Date   TSH 2.15 12/07/2022   Lab Results  Component Value Date   HGBA1C 8.0 07/26/2023   Lab Results  Component Value Date   CHOL 167 07/26/2023   HDL 40 07/26/2023   LDLCALC 100 07/26/2023   LDLDIRECT 96.0 08/23/2020   TRIG 177 (A) 07/26/2023   CHOLHDL 4.4 10/21/2020    Significant Diagnostic Results in last 30 days:  No results found.  Assessment/Plan 1. Frequent falls (Primary) - 03/27 fall with hematoma - 04/05 fall with incontinence B&B - poor safety awareness  - he does have chair alarm -  concerned he is having urinary frequency which is contributing to increased falls> similar presentation about 8 months ago - UA/culture   2. Hematoma of occipital surface of head, initial encounter - see above - almost healed - neuro checks normal  3. Benign prostatic hyperplasia with urinary frequency - cont finasteride and Flomax  4. Memory deficit - BIMS 14/15 07/29/2023 - MMSE 29/30 08/16/2023 - gave simple answers today, no detailed responses - 01/2023 CT head noted chronic small vessel ischemic disease - cont skilled nursing   Family/ staff Communication: plan discussed with patient and nurse  Labs/tests ordered:  UA/culture

## 2023-10-14 ENCOUNTER — Encounter: Payer: Self-pay | Admitting: Orthopedic Surgery

## 2023-10-14 ENCOUNTER — Non-Acute Institutional Stay (SKILLED_NURSING_FACILITY): Payer: Self-pay | Admitting: Orthopedic Surgery

## 2023-10-14 DIAGNOSIS — E1122 Type 2 diabetes mellitus with diabetic chronic kidney disease: Secondary | ICD-10-CM | POA: Diagnosis not present

## 2023-10-14 DIAGNOSIS — N3000 Acute cystitis without hematuria: Secondary | ICD-10-CM

## 2023-10-14 DIAGNOSIS — E782 Mixed hyperlipidemia: Secondary | ICD-10-CM | POA: Diagnosis not present

## 2023-10-14 DIAGNOSIS — R350 Frequency of micturition: Secondary | ICD-10-CM

## 2023-10-14 DIAGNOSIS — H905 Unspecified sensorineural hearing loss: Secondary | ICD-10-CM

## 2023-10-14 DIAGNOSIS — N1831 Chronic kidney disease, stage 3a: Secondary | ICD-10-CM

## 2023-10-14 DIAGNOSIS — N3944 Nocturnal enuresis: Secondary | ICD-10-CM

## 2023-10-14 DIAGNOSIS — I10 Essential (primary) hypertension: Secondary | ICD-10-CM

## 2023-10-14 DIAGNOSIS — R296 Repeated falls: Secondary | ICD-10-CM

## 2023-10-14 DIAGNOSIS — N401 Enlarged prostate with lower urinary tract symptoms: Secondary | ICD-10-CM

## 2023-10-14 DIAGNOSIS — I2510 Atherosclerotic heart disease of native coronary artery without angina pectoris: Secondary | ICD-10-CM

## 2023-10-14 DIAGNOSIS — R413 Other amnesia: Secondary | ICD-10-CM

## 2023-10-14 NOTE — Progress Notes (Signed)
 Location:  [facility] Room Number: 11/A Place of Service:  SNF (31) Provider:  Arnetha Bhat, NP   Arnetha Bhat, NP  Patient Care Team: Arnetha Bhat, NP as PCP - General (Adult Health Nurse Practitioner) Loyde Rule, MD as PCP - Cardiology (Cardiology)  Extended Emergency Contact Information Primary Emergency Contact: [name],[name]          [place], [place] Home Phone: [phone] Mobile Phone: [phone] Relation: Daughter Secondary Emergency Contact: [name],[name] Address: [address]          [place], [place] [address] [place] Home Phone: [phone] Mobile Phone: [phone] Relation: Daughter  Code Status:  DNR Goals of care: Advanced Directive information    09/12/2023    4:02 PM  Advanced Directives  Does Patient Have a Medical Advance Directive? Yes  Type of Estate agent of [place];Living will;Out of facility DNR (pink MOST or yellow form)  Does patient want to make changes to medical advance directive? No - Patient declined  Copy of Healthcare Power of Attorney in Chart? Yes - validated most recent copy scanned in chart (See row information)     Chief Complaint  Patient presents with   Medical Management of Chronic Issues    HPI:  Pt is a 88 y.o. male seen today for medical management of chronic diseases.    He currently resides on the skilled nursing unit at [facility]. PMH: CAD, s/p CABG 2000, HTN, TIA 2022, T2DM, HOH- cochlear implant, OA, BPH, CKD, memory deficit, shuffling gait, and weight loss.     T2DM- A1c 8.0 (02/06)> was 7.4 (10/03)> was 7.7 (06/21), blood sugars 100-23's, no hypoglycemias, urine microalbumin 31 05/06/2023, eye exam 12/03/2022, regular diet, remains on metformin  CAD- H/o CABG, followed by cardiology, 10/2020 LVEF 60-65%, remains on plavix  HTN- BUN/creat 31/1.34 09/19/2023, remains on amlodipine  HLD- allergy to simvastatin , total 167, LDL  100 07/25/2023, remains on Zetia Memory deficit- MMSE 29/30 08/16/2023, BIMS score 14/15 (02/10)> was 12/15 (11/07), CT head 12/2020 atrophy and chronic microvascular ischemic changes noted, no behaviors BPH- remains on finasteride , remains on Flomax and finasteride  Nocturnal enuresis- scheduled toileting at midnight and 4 am Sensory hearing loss- followed by Dr. Bevin Bucks, refusing to wear right cochlear implant more often Acute cystitis- urine culture > 100,000 cfu/mL klebsiella pneumoniae, completed cipro x 5 days Frequent falls- 03/27 fall with hematoma, 04/05 fall in room, he has chair alarm and floor mats at bedtime, no recent episodes  Recent weights:  04/28- 168 lbs  04/02- 165 lbs  03/06- 172 lbs  Recent blood pressures:  04/22- 166/89  04/15- 147/94  04/08- 148/80   Past Medical History:  Diagnosis Date   Cancer (HCC)    skin   CKD (chronic kidney disease) 06/24/2013   Cochlear implant in place    Coronary artery disease    a. s/p CABG 2000; b. myoview 9/08: inf-septal scar with mild peri-infarct ischemia (low risk);  c.  Echo 8/13: mild focal basal septal hypertrophy, Gr 1 DD, mild LAE;  d. Lexiscan  Myoview (06/20/2013): No reversible ischemia, inferior and septal infarct, inferior and septal hypokinesis, EF 60%;  e.  Echo (06/20/2011): EF 50-55%, normal wall motion, grade 1 diastolic dysfunction, mild LAE   Diverticular disease    Duodenitis    Elevated PSA    GERD (gastroesophageal reflux disease)    Gout    Humerus fracture    right   Hydronephrosis with ureteropelvic  junction obstruction    Hypercalcemia 02/02/2012   a. 01/2012: 10.7.   Hyperlipidemia    Hypertension    Pancreatitis    a. ? Simvastatin  related   Pneumonia age 13 or 7   "I think only once" (06/19/2013)   Syncope    a. 06/2013   TIA (transient ischemic attack)    a. 01/2012 - presentation as acute imbalance;  b. 01/2012 carotid U/S w/o signif stenosis;  c. 01/2012 Echo: EF 60-65%, Gr 1 DD.;  d.  Carotid US   (06/20/2013): 1-39% bilateral ICA stenosis   Tubular adenoma polyp of rectum    Type II diabetes mellitus (HCC)    "diet and exercise controlled at present" (06/19/2013)   Past Surgical History:  Procedure Laterality Date   APPENDECTOMY     CARDIAC CATHETERIZATION  2000   CHOLECYSTECTOMY     COCHLEAR IMPLANT Right 1990's   COLONOSCOPY W/ POLYPECTOMY     no polyp 2012   CORONARY ARTERY BYPASS GRAFT  2000   CABG X5   INGUINAL HERNIA REPAIR Right 09/15/2015   Procedure: OPEN REPAIR RIGHT INGUINAL HERNIA ;  Surgeon: Adalberto Hollow, MD;  Location: [facility] ORS;  Service: General;  Laterality: Right;   INSERTION OF MESH Right 09/15/2015   Procedure: INSERTION OF MESH;  Surgeon: Adalberto Hollow, MD;  Location: [facility];  Service: General;  Laterality: Right;   PARATHYROIDECTOMY  1978   POLYPECTOMY     REVERSE SHOULDER ARTHROPLASTY Right 12/30/2014   Procedure: Right shoulder ORIF, Biomet S3 Plate and ZImmer cable system ;  Surgeon: Winston Hawking, MD;  Location: [facility] OR;  Service: Orthopedics;  Laterality: Right;   right ankle surgery  age 80   TONSILLECTOMY  81   trachestomy as child for throat closing      Allergies  Allergen Reactions   Simvastatin  Other (See Comments)    Possible statin-induced pancreatitis    Outpatient Encounter Medications as of 10/14/2023  Medication Sig   acetaminophen  (TYLENOL ) 500 MG tablet Take 1,000 mg by mouth 2 (two) times daily.   allopurinol  (ZYLOPRIM ) 100 MG tablet Take 100 mg by mouth daily.   Choline Fenofibrate  (FENOFIBRIC ACID ) 135 MG CPDR Take 1 tablet by mouth in the morning.   clopidogrel  (PLAVIX ) 75 MG tablet Take 75 mg by mouth daily.   colchicine  0.6 MG tablet Take 0.6 mg by mouth daily as needed (as directed for gout flares).   ezetimibe (ZETIA) 10 MG tablet Take 10 mg by mouth daily.   finasteride  (PROSCAR ) 5 MG tablet Take 5 mg by mouth every evening.   guaiFENesin -dextromethorphan (ROBITUSSIN DM) 100-10 MG/5ML syrup Take 10 mLs by mouth every 6 (six)  hours as needed for cough.   ipratropium-albuterol (DUONEB) 0.5-2.5 (3) MG/3ML SOLN Take 3 mLs by nebulization every 6 (six) hours as needed.   lisinopril  (ZESTRIL ) 10 MG tablet Take 1 tablet (10 mg total) by mouth daily.   metFORMIN  (GLUCOPHAGE ) 500 MG tablet Take 1,000 mg by mouth every morning.   metFORMIN  (GLUCOPHAGE ) 500 MG tablet Take 500 mg by mouth every evening.   miconazole (MICOTIN) 2 % powder Apply 1 Application topically daily at 12 noon.   polyethylene glycol powder (GLYCOLAX /MIRALAX ) 17 GM/SCOOP powder Take 17 g by mouth 2 (two) times a week.   tamsulosin (FLOMAX) 0.4 MG CAPS capsule Take 0.4 mg by mouth at bedtime.   zinc oxide 20 % ointment Apply 1 application  topically as needed for irritation. Apply transdermally as needed for skin irritation after each incontinent  episode.   No facility-administered encounter medications on file as of 10/14/2023.    Review of Systems  Constitutional:  Negative for fatigue and fever.  HENT:  Positive for trouble swallowing. Negative for sore throat.   Eyes:  Negative for visual disturbance.  Respiratory:  Negative for cough and shortness of breath.   Cardiovascular:  Negative for chest pain and leg swelling.  Gastrointestinal:  Negative for abdominal distention, abdominal pain, constipation and diarrhea.  Genitourinary:  Positive for frequency. Negative for dysuria and hematuria.  Musculoskeletal:  Positive for gait problem.  Skin:  Negative for wound.  Neurological:  Positive for weakness. Negative for dizziness and headaches.  Psychiatric/Behavioral:  Positive for confusion. Negative for dysphoric mood. The patient is not nervous/anxious.     Immunization History  Administered Date(s) Administered   Fluad Quad(high Dose 65+) 04/01/2019, 04/11/2022   Influenza Split 04/13/2011, 05/02/2012   Influenza Whole 05/22/2007, 04/12/2008, 05/02/2009, 04/14/2010   Influenza, High Dose Seasonal PF 04/17/2013, 03/16/2014, 03/05/2016,  03/18/2017, 04/17/2023   Influenza,inj,Quad PF,6+ Mos 03/02/2015, 03/18/2018   Influenza-Unspecified 03/18/2018, 04/12/2021   Moderna Covid-19 Fall Seasonal Vaccine 24yrs & older 04/24/2023   Moderna Covid-19 Vaccine Bivalent Booster 45yrs & up 11/29/2021   Moderna SARS-COV2 Booster Vaccination 03/08/2021   Moderna Sars-Covid-2 Vaccination 06/22/2019, 07/20/2019, 05/02/2020, 11/23/2020   PNEUMOCOCCAL CONJUGATE-20 08/29/2023   PPD Test 01/02/2015   Pneumococcal Conjugate-13 12/29/2015   Pneumococcal Polysaccharide-23 04/19/2006   Respiratory Syncytial Virus Vaccine,Recomb Aduvanted(Arexvy) 05/14/2022   Tdap 09/04/2016   Unspecified SARS-COV-2 Vaccination 04/24/2022   Zoster Recombinant(Shingrix) 09/17/2019, 03/29/2020   Zoster, Live 06/29/2015   Pertinent  Health Maintenance Due  Topic Date Due   OPHTHALMOLOGY EXAM  12/03/2023   INFLUENZA VACCINE  01/17/2024   HEMOGLOBIN A1C  01/23/2024   FOOT EXAM  08/15/2024      01/25/2023    1:55 PM 02/04/2023   11:25 AM 03/20/2023   12:07 PM 05/27/2023   11:29 AM 08/16/2023    2:02 PM  Fall Risk  Falls in the past year? 1 1 1 1 1   Was there an injury with Fall? 0 1 1 0 1  Fall Risk Category Calculator 1 3 3 2 3   Patient at Risk for Falls Due to History of fall(s);Impaired balance/gait;Impaired mobility History of fall(s);Impaired balance/gait;Impaired mobility History of fall(s);Impaired balance/gait;Impaired mobility History of fall(s);Impaired balance/gait;Impaired mobility History of fall(s);Impaired balance/gait;Impaired mobility  Fall risk Follow up Falls evaluation completed;Education provided;Falls prevention discussed Falls evaluation completed;Education provided;Falls prevention discussed Education provided;Falls prevention discussed Falls evaluation completed;Education provided;Falls prevention discussed Falls evaluation completed;Education provided   Functional Status Survey:    Vitals:   10/14/23 1215  BP: (!) 166/89  Pulse: 69   Resp: 18  Temp: 98 F (36.7 C)  SpO2: 96%  Weight: 168 lb (76.2 kg)  Height: 5\' 9"  (1.753 m)   Body mass index is 24.81 kg/m. Physical Exam Vitals reviewed.  Constitutional:      General: He is not in acute distress. HENT:     Head: Normocephalic and atraumatic.     Ears:     Comments: Right cochlear implant Eyes:     General:        Right eye: No discharge.        Left eye: No discharge.  Cardiovascular:     Rate and Rhythm: Normal rate and regular rhythm.     Pulses: Normal pulses.     Heart sounds: Normal heart sounds.  Pulmonary:     Effort: Pulmonary effort is  normal.     Breath sounds: Normal breath sounds.  Abdominal:     General: Bowel sounds are normal. There is no distension.     Palpations: Abdomen is soft.     Tenderness: There is no abdominal tenderness.  Musculoskeletal:     Cervical back: Neck supple.     Right lower leg: No edema.     Left lower leg: No edema.  Skin:    General: Skin is warm.     Capillary Refill: Capillary refill takes less than 2 seconds.  Neurological:     General: No focal deficit present.     Mental Status: He is alert. Mental status is at baseline.     Motor: Weakness present.     Gait: Gait abnormal.     Comments: wheelchair  Psychiatric:        Mood and Affect: Mood normal.     Labs reviewed: Recent Labs    05/06/23 0000 05/21/23 0000  NA 140 141  K 4.1 4.2  CL 107 107  CO2 26* 28*  BUN 34* 35*  CREATININE 1.2 1.2  CALCIUM 10.6 10.4   Recent Labs    05/06/23 0000  AST 16  ALT 12  ALKPHOS 35  ALBUMIN 3.9   Recent Labs    05/06/23 0000  WBC 5.7  NEUTROABS 3,340.00  HGB 12.2*  HCT 37*   Lab Results  Component Value Date   TSH 2.15 12/07/2022   Lab Results  Component Value Date   HGBA1C 8.0 07/26/2023   Lab Results  Component Value Date   CHOL 167 07/26/2023   HDL 40 07/26/2023   LDLCALC 100 07/26/2023   LDLDIRECT 96.0 08/23/2020   TRIG 177 (A) 07/26/2023   CHOLHDL 4.4 10/21/2020     Significant Diagnostic Results in last 30 days:  No results found.  Assessment/Plan 1. Type 2 diabetes mellitus with stage 3a chronic kidney disease, without long-term current use of insulin  (HCC) (Primary) - recent A1c 8.0 - no hypoglycemias - 03/19 metformin  increased to 1000 mg qam - cont metformin  500 mg with dinner - recheck A1c 06/16  2. Atherosclerosis of native coronary artery of native heart without angina pectoris - cont plavix  and Zetia  3. Essential hypertension, benign - controlled with lisinopril   4. Mixed hyperlipidemia - LDL 100 - statin allergy - cont Zetia  5. Memory deficit - recent BIMS 14/15 (02/10) - dependent with ADLS except feeding - weight stable - cont skilled nursing   6. Benign prostatic hyperplasia with urinary frequency - cont finasteride  and Flomax  7. Nocturnal enuresis - cont scheduled toileting in PM  8. Sensory hearing loss - followed by Dr. Bevin Bucks - not always wearing cochlear implant - lip reads well  9. Acute cystitis without hematuria - 04/17 urine culture + klebsiella pneumoniae - resolved with cipro x 5 days  10. Frequent falls - 03/27 fall with hematoma - 04/05 fall in room  - cont chair alarm and blue floor mats at night     Family/ staff Communication: plan discussed with patient and nurse  Labs/tests ordered: A1c scheduled 06/16

## 2023-10-15 NOTE — Telephone Encounter (Signed)
 Duplicate

## 2023-10-15 NOTE — Telephone Encounter (Signed)
Message routed to PCP [place], Amy E, NP  

## 2023-11-07 ENCOUNTER — Encounter: Payer: Self-pay | Admitting: Internal Medicine

## 2023-11-07 ENCOUNTER — Non-Acute Institutional Stay (SKILLED_NURSING_FACILITY): Payer: Self-pay | Admitting: Internal Medicine

## 2023-11-07 DIAGNOSIS — N401 Enlarged prostate with lower urinary tract symptoms: Secondary | ICD-10-CM

## 2023-11-07 DIAGNOSIS — E1122 Type 2 diabetes mellitus with diabetic chronic kidney disease: Secondary | ICD-10-CM

## 2023-11-07 DIAGNOSIS — E782 Mixed hyperlipidemia: Secondary | ICD-10-CM

## 2023-11-07 DIAGNOSIS — N1831 Chronic kidney disease, stage 3a: Secondary | ICD-10-CM

## 2023-11-07 DIAGNOSIS — H905 Unspecified sensorineural hearing loss: Secondary | ICD-10-CM

## 2023-11-07 DIAGNOSIS — R350 Frequency of micturition: Secondary | ICD-10-CM

## 2023-11-07 DIAGNOSIS — I10 Essential (primary) hypertension: Secondary | ICD-10-CM

## 2023-11-07 DIAGNOSIS — Z8673 Personal history of transient ischemic attack (TIA), and cerebral infarction without residual deficits: Secondary | ICD-10-CM

## 2023-11-07 NOTE — Progress Notes (Signed)
 Location:  [facility] Room Number: 11 A Place of Service:  SNF [phone]) Provider:  Marguerite Shiley, MD    Patient Care Team: Arnetha Bhat, NP as PCP - General (Adult Health Nurse Practitioner) Loyde Rule, MD as PCP - Cardiology (Cardiology)  Extended Emergency Contact Information Primary Emergency Contact: [name],[name]          [place], [place] Home Phone: [phone] Mobile Phone: [phone] Relation: Daughter Secondary Emergency Contact: [name],[name] Address: [address]          [place], [place] [address] [place] Home Phone: [phone] Mobile Phone: [phone] Relation: Daughter  Code Status:  DNR Goals of care: Advanced Directive information    09/12/2023    4:02 PM  Advanced Directives  Does Patient Have a Medical Advance Directive? Yes  Type of Estate agent of [place];Living will;Out of facility DNR (pink MOST or yellow form)  Does patient want to make changes to medical advance directive? No - Patient declined  Copy of Healthcare Power of Attorney in Chart? Yes - validated most recent copy scanned in chart (See row information)     Chief Complaint  Patient presents with   Routine visit    HPI:  Pt is a 88 y.o. male seen today for medical management of chronic diseases.    Lives in SNF   Patient has a history of CAD, S/P CABG in 2000, gout, HLD, hypertension and diabetes Unstable Gait No further work up per Neurology Has Auditory Implants  Continue to be stable Staff Trying to be proactive to Prevent his falls No Recent Falls Had no complains today Uses Wheelchair  Can do his Transfers Wt Readings from Last 3 Encounters:  11/07/23 170 lb 8 oz (77.3 kg)  10/14/23 168 lb (76.2 kg)  09/23/23 169 lb 12.8 oz (77 kg)    Weight mostly stable  Past Medical History:  Diagnosis Date   Cancer (HCC)    skin   CKD (chronic kidney disease) 06/24/2013   Cochlear  implant in place    Coronary artery disease    a. s/p CABG 2000; b. myoview 9/08: inf-septal scar with mild peri-infarct ischemia (low risk);  c.  Echo 8/13: mild focal basal septal hypertrophy, Gr 1 DD, mild LAE;  d. Lexiscan  Myoview (06/20/2013): No reversible ischemia, inferior and septal infarct, inferior and septal hypokinesis, EF 60%;  e.  Echo (06/20/2011): EF 50-55%, normal wall motion, grade 1 diastolic dysfunction, mild LAE   Diverticular disease    Duodenitis    Elevated PSA    GERD (gastroesophageal reflux disease)    Gout    Humerus fracture    right   Hydronephrosis with ureteropelvic junction obstruction    Hypercalcemia 02/02/2012   a. 01/2012: 10.7.   Hyperlipidemia    Hypertension    Pancreatitis    a. ? Simvastatin  related   Pneumonia age 16 or 7   "I think only once" (06/19/2013)   Syncope    a. 06/2013   TIA (transient ischemic attack)    a. 01/2012 - presentation as acute imbalance;  b. 01/2012 carotid U/S w/o signif stenosis;  c. 01/2012 Echo: EF 60-65%, Gr 1 DD.;  d.  Carotid US  (06/20/2013): 1-39% bilateral ICA stenosis   Tubular adenoma polyp of rectum    Type II diabetes mellitus (HCC)    "diet and exercise controlled at present" (06/19/2013)   Past Surgical History:  Procedure Laterality Date  APPENDECTOMY     CARDIAC CATHETERIZATION  2000   CHOLECYSTECTOMY     COCHLEAR IMPLANT Right 1990's   COLONOSCOPY W/ POLYPECTOMY     no polyp 2012   CORONARY ARTERY BYPASS GRAFT  2000   CABG X5   INGUINAL HERNIA REPAIR Right 09/15/2015   Procedure: OPEN REPAIR RIGHT INGUINAL HERNIA ;  Surgeon: Adalberto Hollow, MD;  Location: [facility];  Service: General;  Laterality: Right;   INSERTION OF MESH Right 09/15/2015   Procedure: INSERTION OF MESH;  Surgeon: Adalberto Hollow, MD;  Location: [facility];  Service: General;  Laterality: Right;   PARATHYROIDECTOMY  1978   POLYPECTOMY     REVERSE SHOULDER ARTHROPLASTY Right 12/30/2014   Procedure: Right shoulder ORIF, Biomet S3 Plate and ZImmer  cable system ;  Surgeon: Winston Hawking, MD;  Location: [facility] OR;  Service: Orthopedics;  Laterality: Right;   right ankle surgery  age 20   TONSILLECTOMY  61   trachestomy as child for throat closing      Allergies  Allergen Reactions   Simvastatin  Other (See Comments)    Possible statin-induced pancreatitis    Outpatient Encounter Medications as of 11/07/2023  Medication Sig   acetaminophen  (TYLENOL ) 500 MG tablet Take 1,000 mg by mouth 2 (two) times daily.   allopurinol  (ZYLOPRIM ) 100 MG tablet Take 100 mg by mouth daily.   Choline Fenofibrate  (FENOFIBRIC ACID ) 135 MG CPDR Take 1 tablet by mouth in the morning.   clopidogrel  (PLAVIX ) 75 MG tablet Take 75 mg by mouth daily.   colchicine  0.6 MG tablet Take 0.6 mg by mouth daily as needed (as directed for gout flares).   ezetimibe (ZETIA) 10 MG tablet Take 10 mg by mouth daily.   finasteride  (PROSCAR ) 5 MG tablet Take 5 mg by mouth every evening.   guaiFENesin -dextromethorphan (ROBITUSSIN DM) 100-10 MG/5ML syrup Take 10 mLs by mouth every 6 (six) hours as needed for cough.   ipratropium-albuterol (DUONEB) 0.5-2.5 (3) MG/3ML SOLN Take 3 mLs by nebulization every 6 (six) hours as needed.   lisinopril  (ZESTRIL ) 10 MG tablet Take 1 tablet (10 mg total) by mouth daily.   metFORMIN  (GLUCOPHAGE ) 500 MG tablet Take 1,000 mg by mouth every morning.   metFORMIN  (GLUCOPHAGE ) 500 MG tablet Take 500 mg by mouth every evening. Give 250 mg by mouth in the evening for T2DM   miconazole (MICOTIN) 2 % powder Apply 1 Application topically daily at 12 noon.   polyethylene glycol powder (GLYCOLAX /MIRALAX ) 17 GM/SCOOP powder Take 17 g by mouth 2 (two) times a week.   tamsulosin (FLOMAX) 0.4 MG CAPS capsule Take 0.4 mg by mouth at bedtime.   zinc oxide 20 % ointment Apply 1 application  topically as needed for irritation. Apply transdermally as needed for skin irritation after each incontinent episode.   No facility-administered encounter medications on file as  of 11/07/2023.    Review of Systems  Constitutional:  Negative for activity change, appetite change and unexpected weight change.  HENT: Negative.    Respiratory:  Negative for cough and shortness of breath.   Cardiovascular:  Negative for leg swelling.  Gastrointestinal:  Negative for constipation.  Genitourinary:  Negative for frequency.  Musculoskeletal:  Positive for gait problem. Negative for arthralgias and myalgias.  Skin: Negative.  Negative for rash.  Neurological:  Negative for dizziness and weakness.  Psychiatric/Behavioral:  Negative for confusion and sleep disturbance.   All other systems reviewed and are negative.   Immunization History  Administered Date(s) Administered  Fluad Quad(high Dose 65+) 04/01/2019, 04/11/2022   Influenza Split 04/13/2011, 05/02/2012   Influenza Whole 05/22/2007, 04/12/2008, 05/02/2009, 04/14/2010   Influenza, High Dose Seasonal PF 04/17/2013, 03/16/2014, 03/05/2016, 03/18/2017, 04/17/2023   Influenza,inj,Quad PF,6+ Mos 03/02/2015, 03/18/2018   Influenza-Unspecified 03/18/2018, 04/12/2021   Moderna Covid-19 Fall Seasonal Vaccine 35yrs & older 04/24/2023   Moderna Covid-19 Vaccine Bivalent Booster 54yrs & up 11/29/2021   Moderna SARS-COV2 Booster Vaccination 03/08/2021   Moderna Sars-Covid-2 Vaccination 06/22/2019, 07/20/2019, 05/02/2020, 11/23/2020   PNEUMOCOCCAL CONJUGATE-20 08/29/2023   PPD Test 01/02/2015   Pneumococcal Conjugate-13 12/29/2015   Pneumococcal Polysaccharide-23 04/19/2006   Respiratory Syncytial Virus Vaccine,Recomb Aduvanted(Arexvy) 05/14/2022   Tdap 09/04/2016   Unspecified SARS-COV-2 Vaccination 04/24/2022   Zoster Recombinant(Shingrix) 09/17/2019, 03/29/2020   Zoster, Live 06/29/2015   Pertinent  Health Maintenance Due  Topic Date Due   OPHTHALMOLOGY EXAM  12/03/2023   INFLUENZA VACCINE  01/17/2024   HEMOGLOBIN A1C  01/23/2024   FOOT EXAM  08/15/2024      01/25/2023    1:55 PM 02/04/2023   11:25 AM 03/20/2023    12:07 PM 05/27/2023   11:29 AM 08/16/2023    2:02 PM  Fall Risk  Falls in the past year? 1 1 1 1 1   Was there an injury with Fall? 0 1 1 0 1  Fall Risk Category Calculator 1 3 3 2 3   Patient at Risk for Falls Due to History of fall(s);Impaired balance/gait;Impaired mobility History of fall(s);Impaired balance/gait;Impaired mobility History of fall(s);Impaired balance/gait;Impaired mobility History of fall(s);Impaired balance/gait;Impaired mobility History of fall(s);Impaired balance/gait;Impaired mobility  Fall risk Follow up Falls evaluation completed;Education provided;Falls prevention discussed Falls evaluation completed;Education provided;Falls prevention discussed Education provided;Falls prevention discussed Falls evaluation completed;Education provided;Falls prevention discussed Falls evaluation completed;Education provided   Functional Status Survey:    Vitals:   11/07/23 1357  BP: (!) 146/82  Pulse: 87  Resp: 18  Temp: 98 F (36.7 C)  SpO2: 92%  Weight: 170 lb 8 oz (77.3 kg)  Height: 5\' 9"  (1.753 m)   Body mass index is 25.18 kg/m. Physical Exam Vitals reviewed.  Constitutional:      Appearance: Normal appearance.  HENT:     Head: Normocephalic.     Nose: Nose normal.     Mouth/Throat:     Mouth: Mucous membranes are moist.     Pharynx: Oropharynx is clear.  Eyes:     Pupils: Pupils are equal, round, and reactive to light.  Cardiovascular:     Rate and Rhythm: Normal rate and regular rhythm.     Pulses: Normal pulses.     Heart sounds: No murmur heard. Pulmonary:     Effort: Pulmonary effort is normal. No respiratory distress.     Breath sounds: Normal breath sounds. No rales.  Abdominal:     General: Abdomen is flat. Bowel sounds are normal.     Palpations: Abdomen is soft.  Musculoskeletal:        General: No swelling.     Cervical back: Neck supple.  Skin:    General: Skin is warm.  Neurological:     General: No focal deficit present.     Mental  Status: He is alert and oriented to person, place, and time.  Psychiatric:        Mood and Affect: Mood normal.        Thought Content: Thought content normal.     Labs reviewed: Recent Labs    05/06/23 0000 05/21/23 0000  NA 140 141  K 4.1  4.2  CL 107 107  CO2 26* 28*  BUN 34* 35*  CREATININE 1.2 1.2  CALCIUM 10.6 10.4   Recent Labs    05/06/23 0000  AST 16  ALT 12  ALKPHOS 35  ALBUMIN 3.9   Recent Labs    05/06/23 0000  WBC 5.7  NEUTROABS 3,340.00  HGB 12.2*  HCT 37*   Lab Results  Component Value Date   TSH 2.15 12/07/2022   Lab Results  Component Value Date   HGBA1C 8.0 07/26/2023   Lab Results  Component Value Date   CHOL 167 07/26/2023   HDL 40 07/26/2023   LDLCALC 100 07/26/2023   LDLDIRECT 96.0 08/23/2020   TRIG 177 (A) 07/26/2023   CHOLHDL 4.4 10/21/2020    Significant Diagnostic Results in last 30 days:  No results found.  Assessment/Plan 1. Type 2 diabetes mellitus with stage 3a chronic kidney disease, without long-term current use of insulin  (HCC) (Primary) Metformin  increased in 03/25 for Elevated A1C Repeat Pending  2. Mixed hyperlipidemia On Zetia Allergic to Statin Last LDL was 100 in 3/25 3. Benign prostatic hyperplasia with urinary frequency Proscar  and Flomax  4. Sensory hearing loss Has Auditory Implant  5. History of TIA (transient ischemic attack) Zetia and Plavix   6. Essential hypertension, benign Lisinopril     Family/ staff Communication:   Labs/tests ordered:

## 2023-12-02 LAB — TSH: TSH: 2.15 (ref 0.41–5.90)

## 2023-12-02 LAB — HEMOGLOBIN A1C
Hemoglobin A1C: 7.7
Hemoglobin A1C: 8.2

## 2023-12-09 ENCOUNTER — Non-Acute Institutional Stay (INDEPENDENT_AMBULATORY_CARE_PROVIDER_SITE_OTHER): Payer: Self-pay | Admitting: Orthopedic Surgery

## 2023-12-09 ENCOUNTER — Encounter: Payer: Self-pay | Admitting: Orthopedic Surgery

## 2023-12-09 DIAGNOSIS — N1831 Chronic kidney disease, stage 3a: Secondary | ICD-10-CM

## 2023-12-09 DIAGNOSIS — I10 Essential (primary) hypertension: Secondary | ICD-10-CM

## 2023-12-09 DIAGNOSIS — R413 Other amnesia: Secondary | ICD-10-CM

## 2023-12-09 DIAGNOSIS — H905 Unspecified sensorineural hearing loss: Secondary | ICD-10-CM

## 2023-12-09 DIAGNOSIS — E1122 Type 2 diabetes mellitus with diabetic chronic kidney disease: Secondary | ICD-10-CM | POA: Diagnosis not present

## 2023-12-09 DIAGNOSIS — R350 Frequency of micturition: Secondary | ICD-10-CM

## 2023-12-09 DIAGNOSIS — N3944 Nocturnal enuresis: Secondary | ICD-10-CM

## 2023-12-09 DIAGNOSIS — E785 Hyperlipidemia, unspecified: Secondary | ICD-10-CM

## 2023-12-09 DIAGNOSIS — N401 Enlarged prostate with lower urinary tract symptoms: Secondary | ICD-10-CM

## 2023-12-09 DIAGNOSIS — H6123 Impacted cerumen, bilateral: Secondary | ICD-10-CM | POA: Diagnosis not present

## 2023-12-09 DIAGNOSIS — E1169 Type 2 diabetes mellitus with other specified complication: Secondary | ICD-10-CM | POA: Diagnosis not present

## 2023-12-09 NOTE — Progress Notes (Signed)
 Location:  [facility] Room Number: N11A Place of Service:  SNF (31) Provider:  Gil Greig FORBES CARROLYN Gil Greig FORBES, NP  Patient Care Team: Gil Greig FORBES, NP as PCP - General (Adult Health Nurse Practitioner) Delford Maude BROCKS, MD as PCP - Cardiology (Cardiology)  Extended Emergency Contact Information Primary Emergency Contact: [name],[name]          [place], [place] Home Phone: [phone] Mobile Phone: [phone] Relation: Daughter Secondary Emergency Contact: [name],[name] Address: [address]          [place], [place] [address] [place] Home Phone: [phone] Mobile Phone: [phone] Relation: Daughter  Code Status:  DNR Goals of care: Advanced Directive information    12/09/2023    9:39 AM  Advanced Directives  Does Patient Have a Medical Advance Directive? Yes  Type of Estate agent of [place];Out of facility DNR (pink MOST or yellow form);Living will  Does patient want to make changes to medical advance directive? No - Patient declined  Copy of Healthcare Power of Attorney in Chart? Yes - validated most recent copy scanned in chart (See row information)     Chief Complaint  Patient presents with   Medical Management of Chronic Issues    Routine visit    HPI:  Pt is a 88 y.o. male seen today for medical management of chronic diseases.    He currently resides on the skilled nursing unit at [facility]. PMH: CAD, s/p CABG 2000, HTN, TIA 2022, T2DM, HOH- cochlear implant, OA, BPH, CKD, memory deficit, shuffling gait, and weight loss.     T2DM- A1c 7.7 (06/20)> was 8.0 (02/06)> was 7.4 (10/03)> was 7.7 (06/21), blood sugars 200's, no hypoglycemias, urine microalbumin 31 05/06/2023, eye exam 05/12/2023, regular diet, remains on metformin  CAD- H/o CABG, followed by cardiology, 10/2020 LVEF 60-65%, remains on plavix  HTN- BUN/creat 31/1.34 09/19/2023, remains on amlodipine  HLD- allergy  to simvastatin , total 167, LDL 100 07/25/2023, remains on Zetia Memory deficit- MMSE 29/30 08/16/2023, BIMS score 15/15 (05/06)>was 14/15 (02/10)> was 12/15 (11/07), CT head 12/2020 atrophy and chronic microvascular ischemic changes noted, no behaviors BPH- remains on finasteride , remains on Flomax and finasteride  Nocturnal enuresis- scheduled toileting at midnight and 4 am Sensory hearing loss- followed by Dr. Delores, uses right cochlear implant  Yearly TSH was 2.15 (06/20).   No recent falls of injuries. Ambulates with w/c.  Recent blood pressures:  06/17- 173.7 lbs  05/01- 176.2 lbs  04/01- 177 lbs  Recent weights:  06/01- 173.7 lbs  05/01- 176.2 lbs  04/01- 177 lbs    Past Medical History:  Diagnosis Date   Cancer (HCC)    skin   CKD (chronic kidney disease) 06/24/2013   Cochlear implant in place    Coronary artery disease    a. s/p CABG 2000; b. myoview 9/08: inf-septal scar with mild peri-infarct ischemia (low risk);  c.  Echo 8/13: mild focal basal septal hypertrophy, Gr 1 DD, mild LAE;  d. Lexiscan  Myoview (06/20/2013): No reversible ischemia, inferior and septal infarct, inferior and septal hypokinesis, EF 60%;  e.  Echo (06/20/2011): EF 50-55%, normal wall motion, grade 1 diastolic dysfunction, mild LAE   Diverticular disease    Duodenitis    Elevated PSA    GERD (gastroesophageal reflux disease)    Gout    Humerus fracture    right   Hydronephrosis with ureteropelvic junction obstruction    Hypercalcemia 02/02/2012   a. 01/2012: 10.7.  Hyperlipidemia    Hypertension    Pancreatitis    a. ? Simvastatin  related   Pneumonia age 29 or 7   I think only once (06/19/2013)   Syncope    a. 06/2013   TIA (transient ischemic attack)    a. 01/2012 - presentation as acute imbalance;  b. 01/2012 carotid U/S w/o signif stenosis;  c. 01/2012 Echo: EF 60-65%, Gr 1 DD.;  d.  Carotid US  (06/20/2013): 1-39% bilateral ICA stenosis   Tubular adenoma polyp of rectum    Type II diabetes  mellitus (HCC)    diet and exercise controlled at present (06/19/2013)   Past Surgical History:  Procedure Laterality Date   APPENDECTOMY     CARDIAC CATHETERIZATION  2000   CHOLECYSTECTOMY     COCHLEAR IMPLANT Right 1990's   COLONOSCOPY W/ POLYPECTOMY     no polyp 2012   CORONARY ARTERY BYPASS GRAFT  2000   CABG X5   INGUINAL HERNIA REPAIR Right 09/15/2015   Procedure: OPEN REPAIR RIGHT INGUINAL HERNIA ;  Surgeon: Krystal Russell, MD;  Location: [facility];  Service: General;  Laterality: Right;   INSERTION OF MESH Right 09/15/2015   Procedure: INSERTION OF MESH;  Surgeon: Krystal Russell, MD;  Location: [facility];  Service: General;  Laterality: Right;   PARATHYROIDECTOMY  1978   POLYPECTOMY     REVERSE SHOULDER ARTHROPLASTY Right 12/30/2014   Procedure: Right shoulder ORIF, Biomet S3 Plate and ZImmer cable system ;  Surgeon: Marcey Her, MD;  Location: [facility] OR;  Service: Orthopedics;  Laterality: Right;   right ankle surgery  age 71   TONSILLECTOMY  65   trachestomy as child for throat closing      Allergies  Allergen Reactions   Simvastatin  Other (See Comments)    Possible statin-induced pancreatitis    Outpatient Encounter Medications as of 12/09/2023  Medication Sig   acetaminophen  (TYLENOL ) 500 MG tablet Take 1,000 mg by mouth 2 (two) times daily.   allopurinol  (ZYLOPRIM ) 100 MG tablet Take 100 mg by mouth daily.   Choline Fenofibrate  (FENOFIBRIC ACID ) 135 MG CPDR Take 1 tablet by mouth in the morning.   clopidogrel  (PLAVIX ) 75 MG tablet Take 75 mg by mouth daily.   colchicine  0.6 MG tablet Take 0.6 mg by mouth daily as needed (as directed for gout flares).   ezetimibe (ZETIA) 10 MG tablet Take 10 mg by mouth daily.   finasteride  (PROSCAR ) 5 MG tablet Take 5 mg by mouth every evening.   guaiFENesin -dextromethorphan (ROBITUSSIN DM) 100-10 MG/5ML syrup Take 10 mLs by mouth every 6 (six) hours as needed for cough.   ipratropium-albuterol (DUONEB) 0.5-2.5 (3) MG/3ML SOLN Take 3 mLs  by nebulization every 6 (six) hours as needed.   lisinopril  (ZESTRIL ) 10 MG tablet Take 1 tablet (10 mg total) by mouth daily.   metFORMIN  (GLUCOPHAGE ) 500 MG tablet Take 1,000 mg by mouth every morning.   metFORMIN  (GLUCOPHAGE ) 500 MG tablet Take 500 mg by mouth every evening. Give 250 mg by mouth in the evening for T2DM   miconazole (MICOTIN) 2 % powder Apply 1 Application topically daily at 12 noon.   polyethylene glycol powder (GLYCOLAX /MIRALAX ) 17 GM/SCOOP powder Take 17 g by mouth 2 (two) times a week.   tamsulosin (FLOMAX) 0.4 MG CAPS capsule Take 0.4 mg by mouth at bedtime.   zinc oxide 20 % ointment Apply 1 application  topically as needed for irritation. Apply transdermally as needed for skin irritation after each incontinent episode.   No  facility-administered encounter medications on file as of 12/09/2023.    Review of Systems  Constitutional:  Negative for fatigue and fever.  HENT:  Positive for hearing loss. Negative for sore throat and trouble swallowing.   Eyes:  Negative for visual disturbance.  Respiratory:  Negative for cough and shortness of breath.   Cardiovascular:  Negative for chest pain and leg swelling.  Gastrointestinal:  Negative for abdominal distention and abdominal pain.  Genitourinary:  Positive for frequency and urgency. Negative for dysuria and hematuria.  Musculoskeletal:  Positive for gait problem.  Skin:  Negative for wound.  Neurological:  Positive for weakness. Negative for dizziness and headaches.  Psychiatric/Behavioral:  Negative for dysphoric mood. The patient is not nervous/anxious.     Immunization History  Administered Date(s) Administered   Fluad Quad(high Dose 65+) 04/01/2019, 04/11/2022   Influenza Split 04/13/2011, 05/02/2012   Influenza Whole 05/22/2007, 04/12/2008, 05/02/2009, 04/14/2010   Influenza, High Dose Seasonal PF 04/17/2013, 03/16/2014, 03/05/2016, 03/18/2017, 04/17/2023   Influenza,inj,Quad PF,6+ Mos 03/02/2015, 03/18/2018    Influenza-Unspecified 03/18/2018, 04/12/2021   Moderna Covid-19 Fall Seasonal Vaccine 28yrs & older 04/24/2023   Moderna Covid-19 Vaccine Bivalent Booster 46yrs & up 11/29/2021   Moderna SARS-COV2 Booster Vaccination 03/08/2021   Moderna Sars-Covid-2 Vaccination 06/22/2019, 07/20/2019, 05/02/2020, 11/23/2020   PNEUMOCOCCAL CONJUGATE-20 08/29/2023   PPD Test 01/02/2015   Pneumococcal Conjugate-13 12/29/2015   Pneumococcal Polysaccharide-23 04/19/2006   Respiratory Syncytial Virus Vaccine,Recomb Aduvanted(Arexvy) 05/14/2022   Tdap 09/04/2016   Unspecified SARS-COV-2 Vaccination 04/24/2022   Zoster Recombinant(Shingrix) 09/17/2019, 03/29/2020   Zoster, Live 06/29/2015   Pertinent  Health Maintenance Due  Topic Date Due   OPHTHALMOLOGY EXAM  12/03/2023   INFLUENZA VACCINE  01/17/2024   HEMOGLOBIN A1C  06/02/2024   FOOT EXAM  08/15/2024      01/25/2023    1:55 PM 02/04/2023   11:25 AM 03/20/2023   12:07 PM 05/27/2023   11:29 AM 08/16/2023    2:02 PM  Fall Risk  Falls in the past year? 1 1 1 1 1   Was there an injury with Fall? 0 1 1 0 1  Fall Risk Category Calculator 1 3 3 2 3   Patient at Risk for Falls Due to History of fall(s);Impaired balance/gait;Impaired mobility History of fall(s);Impaired balance/gait;Impaired mobility History of fall(s);Impaired balance/gait;Impaired mobility History of fall(s);Impaired balance/gait;Impaired mobility History of fall(s);Impaired balance/gait;Impaired mobility  Fall risk Follow up Falls evaluation completed;Education provided;Falls prevention discussed Falls evaluation completed;Education provided;Falls prevention discussed Education provided;Falls prevention discussed Falls evaluation completed;Education provided;Falls prevention discussed Falls evaluation completed;Education provided   Functional Status Survey:    Vitals:   12/09/23 0937 12/09/23 0938  BP: (!) 156/97 (!) 152/90  Pulse: 84   Resp: (!) 22   Temp: 97.7 F (36.5 C)   SpO2:  97%   Weight: 172 lb 3.2 oz (78.1 kg)   Height: 5' 9 (1.753 m)    Body mass index is 25.43 kg/m. Physical Exam Vitals reviewed.  Constitutional:      General: He is not in acute distress. HENT:     Head: Normocephalic.     Right Ear: There is impacted cerumen.     Left Ear: There is impacted cerumen.     Ears:     Comments: Right cochlear implant    Nose: Nose normal.     Mouth/Throat:     Mouth: Mucous membranes are moist.   Eyes:     General:        Right eye: No discharge.  Left eye: No discharge.    Cardiovascular:     Rate and Rhythm: Normal rate and regular rhythm.     Pulses: Normal pulses.     Heart sounds: Normal heart sounds.  Pulmonary:     Effort: Pulmonary effort is normal.     Breath sounds: Normal breath sounds.  Abdominal:     General: There is no distension.     Palpations: Abdomen is soft.     Tenderness: There is no abdominal tenderness.   Musculoskeletal:     Cervical back: Neck supple.     Right lower leg: No edema.   Skin:    General: Skin is warm.     Capillary Refill: Capillary refill takes less than 2 seconds.   Neurological:     General: No focal deficit present.     Mental Status: He is alert and oriented to person, place, and time.     Motor: Weakness present.     Gait: Gait abnormal.   Psychiatric:        Mood and Affect: Mood normal.     Labs reviewed: Recent Labs    05/06/23 0000 05/21/23 0000  NA 140 141  K 4.1 4.2  CL 107 107  CO2 26* 28*  BUN 34* 35*  CREATININE 1.2 1.2  CALCIUM 10.6 10.4   Recent Labs    05/06/23 0000  AST 16  ALT 12  ALKPHOS 35  ALBUMIN 3.9   Recent Labs    05/06/23 0000  WBC 5.7  NEUTROABS 3,340.00  HGB 12.2*  HCT 37*   Lab Results  Component Value Date   TSH 2.15 12/02/2023   Lab Results  Component Value Date   HGBA1C 8.2 12/02/2023   HGBA1C 7.7 12/02/2023   Lab Results  Component Value Date   CHOL 167 07/26/2023   HDL 40 07/26/2023   LDLCALC 100 07/26/2023    LDLDIRECT 96.0 08/23/2020   TRIG 177 (A) 07/26/2023   CHOLHDL 4.4 10/21/2020    Significant Diagnostic Results in last 30 days:  No results found.  Assessment/Plan 1. Bilateral impacted cerumen (Primary) - start Debrox 5 gtts to both ears at bedtime - flush ears when debrox complete  2. Type 2 diabetes mellitus with stage 3a chronic kidney disease, without long-term current use of insulin  (HCC) - Recent A1c 7.7> was 8.0 - 08/2023 metformin  increased to 1000 mg qam  - eye exam 04/2023 - urine microalbumin 04/2023 - cont metformin  and ACE  3. Essential hypertension, benign - controlled, goal < 150/90 - cont lisinopril   4. Hyperlipidemia associated with type 2 diabetes mellitus (HCC) - LDL 100 07/2023, goal < 70 - allergy to statin - cont Zetia - avoid processed and fried foods  5. Memory deficit - recent BIMS 15/15 10/22/2023 - no behaviors - cont skilled nursing   6. Benign prostatic hyperplasia with urinary frequency - ongoing - cont Flomax and finasteride   7. Nocturnal enuresis - cont Flomax - avoid bladder irritants - reduce fluid intake after dinner  8. Sensory hearing loss - followed by Dr. Waylan - has right cochlear implant     Family/ staff Communication: plan discussed with patient and nurse  Labs/tests ordered:  none

## 2024-01-07 ENCOUNTER — Non-Acute Institutional Stay (SKILLED_NURSING_FACILITY): Payer: Self-pay | Admitting: Orthopedic Surgery

## 2024-01-07 ENCOUNTER — Encounter: Payer: Self-pay | Admitting: Orthopedic Surgery

## 2024-01-07 DIAGNOSIS — E1122 Type 2 diabetes mellitus with diabetic chronic kidney disease: Secondary | ICD-10-CM

## 2024-01-07 DIAGNOSIS — I10 Essential (primary) hypertension: Secondary | ICD-10-CM | POA: Diagnosis not present

## 2024-01-07 DIAGNOSIS — N1832 Chronic kidney disease, stage 3b: Secondary | ICD-10-CM

## 2024-01-07 DIAGNOSIS — R413 Other amnesia: Secondary | ICD-10-CM

## 2024-01-07 DIAGNOSIS — N401 Enlarged prostate with lower urinary tract symptoms: Secondary | ICD-10-CM

## 2024-01-07 DIAGNOSIS — R635 Abnormal weight gain: Secondary | ICD-10-CM | POA: Diagnosis not present

## 2024-01-07 DIAGNOSIS — I2510 Atherosclerotic heart disease of native coronary artery without angina pectoris: Secondary | ICD-10-CM | POA: Diagnosis not present

## 2024-01-07 DIAGNOSIS — N3944 Nocturnal enuresis: Secondary | ICD-10-CM

## 2024-01-07 DIAGNOSIS — E785 Hyperlipidemia, unspecified: Secondary | ICD-10-CM

## 2024-01-07 DIAGNOSIS — H905 Unspecified sensorineural hearing loss: Secondary | ICD-10-CM

## 2024-01-07 DIAGNOSIS — R350 Frequency of micturition: Secondary | ICD-10-CM

## 2024-01-07 DIAGNOSIS — E1169 Type 2 diabetes mellitus with other specified complication: Secondary | ICD-10-CM

## 2024-01-07 NOTE — Progress Notes (Unsigned)
 Location:  [facility]  Nursing Home Room Number: N11-A Place of Service:  SNF (31) Provider: Gil Greig BRAVO, NP  Gil Greig BRAVO, NP  Patient Care Team: Gil Greig BRAVO, NP as PCP - General (Adult Health Nurse Practitioner) Delford Maude BROCKS, MD as PCP - Cardiology (Cardiology)  Extended Emergency Contact Information Primary Emergency Contact: [name],[name]          [place], [place] Home Phone: [phone] Mobile Phone: [phone] Relation: Daughter Secondary Emergency Contact: [name],[name] Address: [address]          [place], [place] [address] [place] Home Phone: [phone] Mobile Phone: [phone] Relation: Daughter  Code Status:  DNR Goals of care: Advanced Directive information    01/07/2024    9:57 AM  Advanced Directives  Does Patient Have a Medical Advance Directive? Yes  Type of Estate agent of [place];Out of facility DNR (pink MOST or yellow form)  Does patient want to make changes to medical advance directive? No - Patient declined  Copy of Healthcare Power of Attorney in Chart? Yes - validated most recent copy scanned in chart (See row information)     Chief Complaint  Patient presents with  . Medical Management of Chronic Issues    Routine visit.     HPI:  Pt is a 88 y.o. male seen today for medical management of chronic diseases.    He currently resides on the skilled nursing unit at [facility]. PMH: CAD, s/p CABG 2000, HTN, TIA 2022, T2DM, HOH- cochlear implant, OA, BPH, CKD, memory deficit, shuffling gait, and weight loss.     T2DM- A1c 8.0 (02/06)> was 7.4 (10/03)> was 7.7 (06/21), blood sugars 180-220's, no hypoglycemias, urine microalbumin 31 05/06/2023, eye exam scheduled 07/23, regular diet, remains on metformin  CAD- H/o CABG, followed by cardiology, 10/2020 LVEF 60-65%, remains on plavix  HTN- BUN/creat 31/1.34 09/19/2023, remains on amlodipine  HLD- allergy to simvastatin ,  total 167, LDL 100 07/25/2023, remains on Zetia Memory deficit- MMSE 29/30 08/16/2023, BIMS score 15/15 (05/06)>was 14/15 (02/10)> was 12/15 (11/07), CT head 12/2020 atrophy and chronic microvascular ischemic changes noted, no behaviors BPH- remains on finasteride , remains on Flomax and finasteride  Nocturnal enuresis- scheduled toileting at midnight and 4 am Sensory hearing loss- followed by Dr. Delores, uses right cochlear implant> 06/23 Debrox with ear flushing> improved this month  Recent weights:  07/01- 172 lbs  06/01- 174 lbs  05/01- 167 lbs  Recent blood pressures:  07/15- 155/82  07/08- 139/86  07/01- 126/73     Past Medical History:  Diagnosis Date  . Cancer (HCC)    skin  . CKD (chronic kidney disease) 06/24/2013  . Cochlear implant in place   . Coronary artery disease    a. s/p CABG 2000; b. myoview 9/08: inf-septal scar with mild peri-infarct ischemia (low risk);  c.  Echo 8/13: mild focal basal septal hypertrophy, Gr 1 DD, mild LAE;  d. Lexiscan  Myoview (06/20/2013): No reversible ischemia, inferior and septal infarct, inferior and septal hypokinesis, EF 60%;  e.  Echo (06/20/2011): EF 50-55%, normal wall motion, grade 1 diastolic dysfunction, mild LAE  . Diverticular disease   . Duodenitis   . Elevated PSA   . GERD (gastroesophageal reflux disease)   . Gout   . Humerus fracture    right  . Hydronephrosis with ureteropelvic junction obstruction   . Hypercalcemia 02/02/2012   a. 01/2012: 10.7.  SABRA Hyperlipidemia   . Hypertension   . Pancreatitis  a. ? Simvastatin  related  . Pneumonia age 4 or 7   I think only once (06/19/2013)  . Syncope    a. 06/2013  . TIA (transient ischemic attack)    a. 01/2012 - presentation as acute imbalance;  b. 01/2012 carotid U/S w/o signif stenosis;  c. 01/2012 Echo: EF 60-65%, Gr 1 DD.;  d.  Carotid US  (06/20/2013): 1-39% bilateral ICA stenosis  . Tubular adenoma polyp of rectum   . Type II diabetes mellitus (HCC)    diet and exercise  controlled at present (06/19/2013)   Past Surgical History:  Procedure Laterality Date  . APPENDECTOMY    . CARDIAC CATHETERIZATION  2000  . CHOLECYSTECTOMY    . COCHLEAR IMPLANT Right 1990's  . COLONOSCOPY W/ POLYPECTOMY     no polyp 2012  . CORONARY ARTERY BYPASS GRAFT  2000   CABG X5  . INGUINAL HERNIA REPAIR Right 09/15/2015   Procedure: OPEN REPAIR RIGHT INGUINAL HERNIA ;  Surgeon: Krystal Russell, MD;  Location: [facility]S;  Service: General;  Laterality: Right;  . INSERTION OF MESH Right 09/15/2015   Procedure: INSERTION OF MESH;  Surgeon: Krystal Russell, MD;  Location: [facility];  Service: General;  Laterality: Right;  . PARATHYROIDECTOMY  1978  . POLYPECTOMY    . REVERSE SHOULDER ARTHROPLASTY Right 12/30/2014   Procedure: Right shoulder ORIF, Biomet S3 Plate and ZImmer cable system ;  Surgeon: Marcey Her, MD;  Location: [facility] OR;  Service: Orthopedics;  Laterality: Right;  . right ankle surgery  age 43  . TONSILLECTOMY  1943  . trachestomy as child for throat closing      Allergies  Allergen Reactions  . Simvastatin  Other (See Comments)    Possible statin-induced pancreatitis    Allergies as of 01/07/2024       Reactions   Simvastatin  Other (See Comments)   Possible statin-induced pancreatitis        Medication List        Accurate as of January 07, 2024  9:59 AM. If you have any questions, ask your nurse or doctor.          acetaminophen  500 MG tablet Commonly known as: TYLENOL  Take 1,000 mg by mouth 2 (two) times daily.   allopurinol  100 MG tablet Commonly known as: ZYLOPRIM  Take 100 mg by mouth daily.   clopidogrel  75 MG tablet Commonly known as: PLAVIX  Take 75 mg by mouth daily.   colchicine  0.6 MG tablet Take 0.6 mg by mouth daily as needed (as directed for gout flares).   ezetimibe 10 MG tablet Commonly known as: ZETIA Take 10 mg by mouth daily.   Fenofibric Acid  135 MG Cpdr Take 1 tablet by mouth in the morning.   finasteride  5 MG  tablet Commonly known as: PROSCAR  Take 5 mg by mouth every evening.   guaiFENesin -dextromethorphan 100-10 MG/5ML syrup Commonly known as: ROBITUSSIN DM Take 10 mLs by mouth every 6 (six) hours as needed for cough.   ipratropium-albuterol 0.5-2.5 (3) MG/3ML Soln Commonly known as: DUONEB Take 3 mLs by nebulization every 6 (six) hours as needed.   lisinopril  10 MG tablet Commonly known as: ZESTRIL  Take 1 tablet (10 mg total) by mouth daily.   metFORMIN  500 MG tablet Commonly known as: GLUCOPHAGE  Take 1,000 mg by mouth every morning.   metFORMIN  500 MG tablet Commonly known as: GLUCOPHAGE  Take 500 mg by mouth every evening. Give 250 mg by mouth in the evening for T2DM   miconazole 2 % powder Commonly known  as: MICOTIN Apply 1 Application topically daily at 12 noon.   polyethylene glycol powder 17 GM/SCOOP powder Commonly known as: GLYCOLAX /MIRALAX  Take 17 g by mouth 2 (two) times a week.   tamsulosin 0.4 MG Caps capsule Commonly known as: FLOMAX Take 0.4 mg by mouth at bedtime.   zinc oxide 20 % ointment Apply 1 application  topically as needed for irritation. Apply transdermally as needed for skin irritation after each incontinent episode.        Review of Systems  Constitutional:  Negative for fatigue and fever.  HENT:  Positive for hearing loss. Negative for trouble swallowing.   Eyes:  Negative for visual disturbance.  Respiratory:  Negative for cough, shortness of breath and wheezing.   Cardiovascular:  Negative for chest pain and leg swelling.  Gastrointestinal:  Negative for abdominal distention and abdominal pain.  Genitourinary:  Positive for frequency and urgency. Negative for hematuria.  Musculoskeletal:  Positive for gait problem.  Skin:  Negative for wound.  Neurological:  Positive for weakness. Negative for dizziness and light-headedness.  Psychiatric/Behavioral:  Negative for confusion, dysphoric mood and sleep disturbance. The patient is not  nervous/anxious.     Immunization History  Administered Date(s) Administered  . Fluad Quad(high Dose 65+) 04/01/2019, 04/11/2022  . Influenza Split 04/13/2011, 05/02/2012  . Influenza Whole 05/22/2007, 04/12/2008, 05/02/2009, 04/14/2010  . Influenza, High Dose Seasonal PF 04/17/2013, 03/16/2014, 03/05/2016, 03/18/2017, 04/17/2023  . Influenza,inj,Quad PF,6+ Mos 03/02/2015, 03/18/2018  . Influenza-Unspecified 03/18/2018, 04/12/2021  . Moderna Covid-19 Fall Seasonal Vaccine 75yrs & older 04/24/2023, 10/09/2023  . Moderna Covid-19 Vaccine Bivalent Booster 43yrs & up 11/29/2021  . Moderna SARS-COV2 Booster Vaccination 03/08/2021  . Moderna Sars-Covid-2 Vaccination 06/22/2019, 07/20/2019, 05/02/2020, 11/23/2020  . PNEUMOCOCCAL CONJUGATE-20 08/29/2023  . PPD Test 01/02/2015  . Pneumococcal Conjugate-13 12/29/2015  . Pneumococcal Polysaccharide-23 04/19/2006  . Respiratory Syncytial Virus Vaccine,Recomb Aduvanted(Arexvy) 05/14/2022  . Tdap 09/04/2016  . Unspecified SARS-COV-2 Vaccination 04/24/2022  . Zoster Recombinant(Shingrix) 09/17/2019, 03/29/2020  . Zoster, Live 06/29/2015   Pertinent  Health Maintenance Due  Topic Date Due  . INFLUENZA VACCINE  01/17/2024  . HEMOGLOBIN A1C  06/02/2024  . FOOT EXAM  08/15/2024  . OPHTHALMOLOGY EXAM  11/27/2024      02/04/2023   11:25 AM 03/20/2023   12:07 PM 05/27/2023   11:29 AM 08/16/2023    2:02 PM 12/09/2023   11:32 AM  Fall Risk  Falls in the past year? 1 1 1 1 1   Was there an injury with Fall? 1 1 0 1 1  Fall Risk Category Calculator 3 3 2 3 3   Patient at Risk for Falls Due to History of fall(s);Impaired balance/gait;Impaired mobility History of fall(s);Impaired balance/gait;Impaired mobility History of fall(s);Impaired balance/gait;Impaired mobility History of fall(s);Impaired balance/gait;Impaired mobility History of fall(s);Impaired balance/gait;Impaired mobility  Fall risk Follow up Falls evaluation completed;Education provided;Falls  prevention discussed Education provided;Falls prevention discussed Falls evaluation completed;Education provided;Falls prevention discussed Falls evaluation completed;Education provided Falls evaluation completed;Education provided   Functional Status Survey:    Vitals:   01/07/24 0943  BP: (!) 155/82  Pulse: 90  Resp: 20  Temp: 97.7 F (36.5 C)  SpO2: 90%  Weight: 176 lb (79.8 kg)  Height: 5' 9 (1.753 m)   Body mass index is 25.99 kg/m. Physical Exam Vitals reviewed.  Constitutional:      General: He is not in acute distress. HENT:     Head: Normocephalic.     Right Ear: There is no impacted cerumen.     Left  Ear: There is no impacted cerumen.     Ears:     Comments: Right cochlear implant Eyes:     General:        Right eye: No discharge.        Left eye: No discharge.  Cardiovascular:     Rate and Rhythm: Normal rate and regular rhythm.     Pulses: Normal pulses.     Heart sounds: Normal heart sounds.  Pulmonary:     Effort: Pulmonary effort is normal.     Breath sounds: Normal breath sounds.  Abdominal:     General: Bowel sounds are normal.     Palpations: Abdomen is soft.  Musculoskeletal:     Cervical back: Neck supple.     Right lower leg: No edema.     Left lower leg: No edema.  Skin:    General: Skin is warm.     Capillary Refill: Capillary refill takes less than 2 seconds.  Neurological:     General: No focal deficit present.     Mental Status: He is alert and oriented to person, place, and time.     Motor: Weakness present.     Gait: Gait abnormal.  Psychiatric:        Mood and Affect: Mood normal.     Labs reviewed: Recent Labs    05/06/23 0000 05/21/23 0000  NA 140 141  K 4.1 4.2  CL 107 107  CO2 26* 28*  BUN 34* 35*  CREATININE 1.2 1.2  CALCIUM 10.6 10.4   Recent Labs    05/06/23 0000  AST 16  ALT 12  ALKPHOS 35  ALBUMIN 3.9   Recent Labs    05/06/23 0000  WBC 5.7  NEUTROABS 3,340.00  HGB 12.2*  HCT 37*   Lab  Results  Component Value Date   TSH 2.15 12/02/2023   Lab Results  Component Value Date   HGBA1C 8.2 12/02/2023   HGBA1C 7.7 12/02/2023   Lab Results  Component Value Date   CHOL 167 07/26/2023   HDL 40 07/26/2023   LDLCALC 100 07/26/2023   LDLDIRECT 96.0 08/23/2020   TRIG 177 (A) 07/26/2023   CHOLHDL 4.4 10/21/2020    Significant Diagnostic Results in last 30 days:  No results found.  Assessment/Plan 1. Weight gain (Primary) ***  2. Type 2 diabetes mellitus with stage 3b chronic kidney disease, without long-term current use of insulin  (HCC) ***  3. Atherosclerosis of native coronary artery of native heart without angina pectoris ***  4. Essential hypertension, benign ***  5. Hyperlipidemia associated with type 2 diabetes mellitus (HCC) ***  6. Memory deficit ***  7. Benign prostatic hyperplasia with urinary frequency ***  8. Nocturnal enuresis ***  9. Sensory hearing loss ***    Family/ staff Communication:   Labs/tests ordered:

## 2024-01-13 LAB — COMPREHENSIVE METABOLIC PANEL WITH GFR
Calcium: 10.4 (ref 8.7–10.7)
eGFR: 48

## 2024-01-13 LAB — BASIC METABOLIC PANEL WITH GFR
BUN: 24 — AB (ref 4–21)
CO2: 24 — AB (ref 13–22)
Chloride: 108 (ref 99–108)
Creatinine: 1.4 — AB (ref 0.6–1.3)
Glucose: 184
Potassium: 4.2 meq/L (ref 3.5–5.1)
Sodium: 141 (ref 137–147)

## 2024-01-13 LAB — CBC AND DIFFERENTIAL
HCT: 39 — AB (ref 41–53)
Hemoglobin: 12.9 — AB (ref 13.5–17.5)
Platelets: 306 K/uL (ref 150–400)
WBC: 6

## 2024-01-13 LAB — TSH: TSH: 1.19 (ref 0.41–5.90)

## 2024-01-13 LAB — HEMOGLOBIN A1C: Hemoglobin A1C: 8.2

## 2024-01-13 LAB — CBC: RBC: 4.32 (ref 3.87–5.11)

## 2024-01-23 LAB — VITAMIN D 25 HYDROXY (VIT D DEFICIENCY, FRACTURES): Vit D, 25-Hydroxy: 15

## 2024-01-23 LAB — COMPREHENSIVE METABOLIC PANEL WITH GFR: Calcium: 10.5 (ref 8.7–10.7)

## 2024-02-06 ENCOUNTER — Non-Acute Institutional Stay (SKILLED_NURSING_FACILITY): Payer: Self-pay | Admitting: Internal Medicine

## 2024-02-06 ENCOUNTER — Encounter: Payer: Self-pay | Admitting: Internal Medicine

## 2024-02-06 DIAGNOSIS — N1832 Chronic kidney disease, stage 3b: Secondary | ICD-10-CM

## 2024-02-06 DIAGNOSIS — R296 Repeated falls: Secondary | ICD-10-CM | POA: Diagnosis not present

## 2024-02-06 DIAGNOSIS — E1122 Type 2 diabetes mellitus with diabetic chronic kidney disease: Secondary | ICD-10-CM | POA: Diagnosis not present

## 2024-02-06 DIAGNOSIS — T148XXA Other injury of unspecified body region, initial encounter: Secondary | ICD-10-CM

## 2024-02-06 DIAGNOSIS — E785 Hyperlipidemia, unspecified: Secondary | ICD-10-CM

## 2024-02-06 DIAGNOSIS — Z66 Do not resuscitate: Secondary | ICD-10-CM | POA: Diagnosis not present

## 2024-02-06 DIAGNOSIS — E1169 Type 2 diabetes mellitus with other specified complication: Secondary | ICD-10-CM

## 2024-02-06 DIAGNOSIS — N401 Enlarged prostate with lower urinary tract symptoms: Secondary | ICD-10-CM

## 2024-02-06 DIAGNOSIS — R635 Abnormal weight gain: Secondary | ICD-10-CM

## 2024-02-06 DIAGNOSIS — I10 Essential (primary) hypertension: Secondary | ICD-10-CM

## 2024-02-06 DIAGNOSIS — R350 Frequency of micturition: Secondary | ICD-10-CM

## 2024-02-06 NOTE — Progress Notes (Unsigned)
 Location:  [facility] Room Number: N11-A Place of Service:  SNF [phone]) Provider:    Charlanne Fredia CROME, MD   Patient Care Team: Gil Greig BRAVO, NP as PCP - General (Adult Health Nurse Practitioner) Delford Maude BROCKS, MD as PCP - Cardiology (Cardiology)  Extended Emergency Contact Information Primary Emergency Contact: [facility], [place] Home Phone: [phone] Mobile Phone: [phone] Relation: Daughter Secondary Emergency Contact: [name],[name] Address: [address]          [place], [place] [address] [place] Home Phone: [phone] Mobile Phone: [phone] Relation: Daughter  Code Status:  DNR Goals of care: Advanced Directive information    01/07/2024    9:57 AM  Advanced Directives  Does Patient Have a Medical Advance Directive? Yes  Type of Estate agent of [place];Out of facility DNR (pink MOST or yellow form)  Does patient want to make changes to medical advance directive? No - Patient declined  Copy of Healthcare Power of Attorney in Chart? Yes - validated most recent copy scanned in chart (See row information)     Chief Complaint  Patient presents with   Medical Management of Chronic Issues    HPI:  Pt is a 88 y.o. male seen today for medical management of chronic diseases.   Lives in SNF  Patient has a history of CAD, S/P CABG in 2000, gout, HLD, hypertension and diabetes Unstable Gait No further work up per Neurology Has Auditory Implants  Patient is stable Did have one fall but on his [facility] with no injuries He rolled out of his Bed He had no Complains Has gained weight Wt Readings from Last 3 Encounters:  02/06/24 175 lb (79.4 kg)  01/07/24 176 lb (79.8 kg)  12/09/23 172 lb 3.2 oz (78.1 kg)   Uses Wheelchair  Can do his Transfers Past Medical History:  Diagnosis Date   Cancer (HCC)    skin   CKD (chronic kidney disease) 06/24/2013    Cochlear implant in place    Coronary artery disease    a. s/p CABG 2000; b. myoview 9/08: inf-septal scar with mild peri-infarct ischemia (low risk);  c.  Echo 8/13: mild focal basal septal hypertrophy, Gr 1 DD, mild LAE;  d. Lexiscan  Myoview (06/20/2013): No reversible ischemia, inferior and septal infarct, inferior and septal hypokinesis, EF 60%;  e.  Echo (06/20/2011): EF 50-55%, normal wall motion, grade 1 diastolic dysfunction, mild LAE   Diverticular disease    Duodenitis    Elevated PSA    GERD (gastroesophageal reflux disease)    Gout    Humerus fracture    right   Hydronephrosis with ureteropelvic junction obstruction    Hypercalcemia 02/02/2012   a. 01/2012: 10.7.   Hyperlipidemia    Hypertension    Pancreatitis    a. ? Simvastatin  related   Pneumonia age 11 or 7   I think only once (06/19/2013)   Syncope    a. 06/2013   TIA (transient ischemic attack)    a. 01/2012 - presentation as acute imbalance;  b. 01/2012 carotid U/S w/o signif stenosis;  c. 01/2012 Echo: EF 60-65%, Gr 1 DD.;  d.  Carotid US  (06/20/2013): 1-39% bilateral ICA stenosis   Tubular adenoma polyp of rectum    Type II diabetes mellitus (HCC)    diet and exercise controlled at present (06/19/2013)   Past Surgical History:  Procedure Laterality Date  APPENDECTOMY     CARDIAC CATHETERIZATION  2000   CHOLECYSTECTOMY     COCHLEAR IMPLANT Right 1990's   COLONOSCOPY W/ POLYPECTOMY     no polyp 2012   CORONARY ARTERY BYPASS GRAFT  2000   CABG X5   INGUINAL HERNIA REPAIR Right 09/15/2015   Procedure: OPEN REPAIR RIGHT INGUINAL HERNIA ;  Surgeon: Krystal Russell, MD;  Location: [facility];  Service: General;  Laterality: Right;   INSERTION OF MESH Right 09/15/2015   Procedure: INSERTION OF MESH;  Surgeon: Krystal Russell, MD;  Location: [facility];  Service: General;  Laterality: Right;   PARATHYROIDECTOMY  1978   POLYPECTOMY     REVERSE SHOULDER ARTHROPLASTY Right 12/30/2014   Procedure: Right shoulder ORIF, Biomet S3 Plate  and ZImmer cable system ;  Surgeon: Marcey Her, MD;  Location: [facility] OR;  Service: Orthopedics;  Laterality: Right;   right ankle surgery  age 81   TONSILLECTOMY  62   trachestomy as child for throat closing      Allergies  Allergen Reactions   Simvastatin  Other (See Comments)    Possible statin-induced pancreatitis    Outpatient Encounter Medications as of 02/06/2024  Medication Sig   acetaminophen  (TYLENOL ) 500 MG tablet Take 1,000 mg by mouth 2 (two) times daily.   allopurinol  (ZYLOPRIM ) 100 MG tablet Take 100 mg by mouth daily.   Choline Fenofibrate  (FENOFIBRIC ACID ) 135 MG CPDR Take 1 tablet by mouth in the morning.   clopidogrel  (PLAVIX ) 75 MG tablet Take 75 mg by mouth daily.   ezetimibe (ZETIA) 10 MG tablet Take 10 mg by mouth daily.   finasteride  (PROSCAR ) 5 MG tablet Take 5 mg by mouth every evening.   lisinopril  (ZESTRIL ) 10 MG tablet Take 1 tablet (10 mg total) by mouth daily.   metFORMIN  (GLUCOPHAGE ) 500 MG tablet Take 1,000 mg by mouth every morning.   metFORMIN  (GLUCOPHAGE ) 500 MG tablet Take 250 mg by mouth every evening. Give 250 mg by mouth in the evening for T2DM   miconazole (MICOTIN) 2 % powder Apply 1 Application topically daily at 12 noon.   polyethylene glycol powder (GLYCOLAX /MIRALAX ) 17 GM/SCOOP powder Take 17 g by mouth 2 (two) times a week.   tamsulosin (FLOMAX) 0.4 MG CAPS capsule Take 0.4 mg by mouth at bedtime.   colchicine  0.6 MG tablet Take 0.6 mg by mouth daily as needed (as directed for gout flares).   guaiFENesin -dextromethorphan (ROBITUSSIN DM) 100-10 MG/5ML syrup Take 10 mLs by mouth every 6 (six) hours as needed for cough.   ipratropium-albuterol (DUONEB) 0.5-2.5 (3) MG/3ML SOLN Take 3 mLs by nebulization every 6 (six) hours as needed.   zinc oxide 20 % ointment Apply 1 application  topically as needed for irritation. Apply transdermally as needed for skin irritation after each incontinent episode.   No facility-administered encounter medications  on file as of 02/06/2024.    Review of Systems  Constitutional:  Negative for activity change, appetite change and unexpected weight change.  HENT: Negative.    Respiratory:  Negative for cough and shortness of breath.   Cardiovascular:  Negative for leg swelling.  Gastrointestinal:  Negative for constipation.  Genitourinary:  Negative for frequency.  Musculoskeletal:  Positive for gait problem. Negative for arthralgias and myalgias.  Skin: Negative.  Negative for rash.  Neurological:  Negative for dizziness and weakness.  Psychiatric/Behavioral:  Negative for confusion and sleep disturbance.   All other systems reviewed and are negative.   Immunization History  Administered Date(s) Administered  Fluad Quad(high Dose 65+) 04/01/2019, 04/11/2022   Influenza Split 04/13/2011, 05/02/2012   Influenza Whole 05/22/2007, 04/12/2008, 05/02/2009, 04/14/2010   Influenza, High Dose Seasonal PF 04/17/2013, 03/16/2014, 03/05/2016, 03/18/2017, 04/17/2023   Influenza,inj,Quad PF,6+ Mos 03/02/2015, 03/18/2018   Influenza-Unspecified 03/18/2018, 04/12/2021   Moderna Covid-19 Fall Seasonal Vaccine 87yrs & older 04/24/2023, 10/09/2023   Moderna Covid-19 Vaccine Bivalent Booster 70yrs & up 11/29/2021   Moderna SARS-COV2 Booster Vaccination 03/08/2021   Moderna Sars-Covid-2 Vaccination 06/22/2019, 07/20/2019, 05/02/2020, 11/23/2020   PNEUMOCOCCAL CONJUGATE-20 08/29/2023   PPD Test 01/02/2015   Pneumococcal Conjugate-13 12/29/2015   Pneumococcal Polysaccharide-23 04/19/2006   Respiratory Syncytial Virus Vaccine,Recomb Aduvanted(Arexvy) 05/14/2022   Tdap 09/04/2016   Unspecified SARS-COV-2 Vaccination 04/24/2022   Zoster Recombinant(Shingrix) 09/17/2019, 03/29/2020   Zoster, Live 06/29/2015   Pertinent  Health Maintenance Due  Topic Date Due   INFLUENZA VACCINE  01/17/2024   HEMOGLOBIN A1C  06/02/2024   FOOT EXAM  08/15/2024   OPHTHALMOLOGY EXAM  11/27/2024      02/04/2023   11:25 AM  03/20/2023   12:07 PM 05/27/2023   11:29 AM 08/16/2023    2:02 PM 12/09/2023   11:32 AM  Fall Risk  Falls in the past year? 1 1 1 1 1   Was there an injury with Fall? 1 1 0 1 1  Fall Risk Category Calculator 3 3 2 3 3   Patient at Risk for Falls Due to History of fall(s);Impaired balance/gait;Impaired mobility History of fall(s);Impaired balance/gait;Impaired mobility History of fall(s);Impaired balance/gait;Impaired mobility History of fall(s);Impaired balance/gait;Impaired mobility History of fall(s);Impaired balance/gait;Impaired mobility  Fall risk Follow up Falls evaluation completed;Education provided;Falls prevention discussed Education provided;Falls prevention discussed Falls evaluation completed;Education provided;Falls prevention discussed Falls evaluation completed;Education provided Falls evaluation completed;Education provided   Functional Status Survey:    Vitals:   02/06/24 0843  BP: 122/74  Pulse: 70  Temp: 97.7 F (36.5 C)  SpO2: 98%  Weight: 175 lb (79.4 kg)  Height: 5' 9 (1.753 m)   Body mass index is 25.84 kg/m. Physical Exam Vitals reviewed.  Constitutional:      Appearance: Normal appearance.  HENT:     Head: Normocephalic.     Nose: Nose normal.     Mouth/Throat:     Mouth: Mucous membranes are moist.     Pharynx: Oropharynx is clear.  Eyes:     Pupils: Pupils are equal, round, and reactive to light.  Cardiovascular:     Rate and Rhythm: Normal rate and regular rhythm.     Pulses: Normal pulses.     Heart sounds: No murmur heard. Pulmonary:     Effort: Pulmonary effort is normal. No respiratory distress.     Breath sounds: Normal breath sounds. No rales.  Abdominal:     General: Abdomen is flat. Bowel sounds are normal.     Palpations: Abdomen is soft.  Musculoskeletal:        General: No swelling.     Cervical back: Neck supple.  Skin:    General: Skin is warm.     Comments: Has small Skin Abrasion on his right ear  Neurological:      General: No focal deficit present.     Mental Status: He is alert and oriented to person, place, and time.  Psychiatric:        Mood and Affect: Mood normal.        Thought Content: Thought content normal.     Labs reviewed: Recent Labs    05/06/23 0000 05/21/23 0000  NA  140 141  K 4.1 4.2  CL 107 107  CO2 26* 28*  BUN 34* 35*  CREATININE 1.2 1.2  CALCIUM 10.6 10.4   Recent Labs    05/06/23 0000  AST 16  ALT 12  ALKPHOS 35  ALBUMIN 3.9   Recent Labs    05/06/23 0000  WBC 5.7  NEUTROABS 3,340.00  HGB 12.2*  HCT 37*   Lab Results  Component Value Date   TSH 2.15 12/02/2023   Lab Results  Component Value Date   HGBA1C 8.2 12/02/2023   HGBA1C 7.7 12/02/2023   Lab Results  Component Value Date   CHOL 167 07/26/2023   HDL 40 07/26/2023   LDLCALC 100 07/26/2023   LDLDIRECT 96.0 08/23/2020   TRIG 177 (A) 07/26/2023   CHOLHDL 4.4 10/21/2020    Significant Diagnostic Results in last 30 days:  No results found.  Assessment/Plan 1. DNR (do not resuscitate) (Primary)  - Do not attempt resuscitation (DNR)  2. Type 2 diabetes mellitus with stage 3b chronic kidney disease, without long-term current use of insulin  (HCC) His recent A1C was elevated 8.2 His CBGS are also now running more then 200 -300 in the morning Will increase his Metformin  to 750 mg at night Latest GFR was 48 Also Consider adding Another meds  3. Weight gain   4. Falls frequently Has Blue Mats and Chair and Bed Alarms  5. Skin abrasion Right ear Showed to Nurse in the Facility Will monitor for now ? Will need Foam dressing   6. Hyperlipidemia associated with type 2 diabetes mellitus (HCC) On Zetia Allergic to Statin Last LDL was 100 in 3/25  7. Essential hypertension, benign Lisinopril   8. Benign prostatic hyperplasia with urinary frequency Doing well on Flomax and Proscar  9 History of TIA (transient ischemic attack) Zetia and Plavix     Family/ staff Communication:    Labs/tests ordered:

## 2024-02-07 DIAGNOSIS — R2681 Unsteadiness on feet: Secondary | ICD-10-CM | POA: Insufficient documentation

## 2024-02-11 ENCOUNTER — Encounter: Payer: Self-pay | Admitting: Internal Medicine

## 2024-03-05 ENCOUNTER — Encounter: Payer: Self-pay | Admitting: Internal Medicine

## 2024-03-06 ENCOUNTER — Non-Acute Institutional Stay (SKILLED_NURSING_FACILITY): Admitting: Orthopedic Surgery

## 2024-03-06 ENCOUNTER — Encounter: Payer: Self-pay | Admitting: Orthopedic Surgery

## 2024-03-06 DIAGNOSIS — E785 Hyperlipidemia, unspecified: Secondary | ICD-10-CM

## 2024-03-06 DIAGNOSIS — H905 Unspecified sensorineural hearing loss: Secondary | ICD-10-CM

## 2024-03-06 DIAGNOSIS — N3944 Nocturnal enuresis: Secondary | ICD-10-CM

## 2024-03-06 DIAGNOSIS — R4189 Other symptoms and signs involving cognitive functions and awareness: Secondary | ICD-10-CM

## 2024-03-06 DIAGNOSIS — I10 Essential (primary) hypertension: Secondary | ICD-10-CM | POA: Diagnosis not present

## 2024-03-06 DIAGNOSIS — D2321 Other benign neoplasm of skin of right ear and external auricular canal: Secondary | ICD-10-CM | POA: Diagnosis not present

## 2024-03-06 DIAGNOSIS — I2510 Atherosclerotic heart disease of native coronary artery without angina pectoris: Secondary | ICD-10-CM

## 2024-03-06 DIAGNOSIS — N401 Enlarged prostate with lower urinary tract symptoms: Secondary | ICD-10-CM

## 2024-03-06 DIAGNOSIS — N1831 Chronic kidney disease, stage 3a: Secondary | ICD-10-CM

## 2024-03-06 DIAGNOSIS — E1122 Type 2 diabetes mellitus with diabetic chronic kidney disease: Secondary | ICD-10-CM

## 2024-03-06 DIAGNOSIS — R350 Frequency of micturition: Secondary | ICD-10-CM

## 2024-03-06 DIAGNOSIS — E1169 Type 2 diabetes mellitus with other specified complication: Secondary | ICD-10-CM

## 2024-03-06 NOTE — Progress Notes (Signed)
 Location:   [facility]  Nursing Home Room Number: 11-A Place of Service:  SNF (31) Provider:  Greig Cluster, NP  PCP: Cluster Greig BRAVO, NP  Patient Care Team: Cluster Greig BRAVO, NP as PCP - General (Adult Health Nurse Practitioner) Delford Maude BROCKS, MD as PCP - Cardiology (Cardiology)  Extended Emergency Contact Information Primary Emergency Contact: [name],[name]          [place], [place] Home Phone: [phone] Mobile Phone: [phone] Relation: Daughter Secondary Emergency Contact: [name],[name] Address: [address]          [place], [place] [address] [place] Home Phone: [phone] Mobile Phone: [phone] Relation: Daughter  Code Status:  DNR Goals of care: Advanced Directive information    03/06/2024    2:09 PM  Advanced Directives  Does Patient Have a Medical Advance Directive? Yes  Type of Estate agent of [place];Living will;Out of facility DNR (pink MOST or yellow form)  Does patient want to make changes to medical advance directive? No - Patient declined  Copy of Healthcare Power of Attorney in Chart? Yes - validated most recent copy scanned in chart (See row information)     Chief Complaint  Patient presents with   Medical Management of Chronic Issues    Routine visit. Discuss the need for Influenza vaccine, and Covid Booster.    HPI:  Pt is a 88 y.o. male seen today for medical management of chronic diseases.    He currently resides on the skilled nursing unit at [facility]. PMH: CAD, s/p CABG 2000, HTN, TIA 2022, T2DM, HOH- cochlear implant, OA, BPH, CKD, memory deficit, shuffling gait, and weight loss.     T2DM- A1c 8.2 (07/28)> was 8.0 (02/06)> was 7.4 (10/03)> was 7.7 (06/21), blood sugars 210-240's, no hypoglycemias, urine microalbumin 31 11/18/202, cerb mod diet, remains on metformin  CAD- H/o CABG, followed by cardiology, 10/2020 LVEF 60-65%, remains on plavix  HTN- BUN/creat 24/1.4  01/13/2024, remains on amlodipine  HLD- allergy to simvastatin , total 167, LDL 100 07/25/2023, remains on Zetia Cognitive impairment-  MMSE 29/30 08/16/2023, BIMS score 12/15 (08/06)> was 15/15 (05/06), CT head 12/2020 atrophy and chronic microvascular ischemic changes noted, not wanting to wear glasses and hearing aids at times, no agitation, not on medication BPH- remains on finasteride , remains on Flomax and finasteride  Nocturnal enuresis- scheduled toileting at midnight and 4 am Sensory hearing loss- followed by Dr. Delores, uses right cochlear implant  Recent weights:  09/05- 174 lbs  08/01- 175 lbs  07/01- 172 lbs   Recent blood pressures:  09/16- 131/92  09/09- 140/83  09/02- 140/86     Past Medical History:  Diagnosis Date   Cancer (HCC)    skin   CKD (chronic kidney disease) 06/24/2013   Cochlear implant in place    Coronary artery disease    a. s/p CABG 2000; b. myoview 9/08: inf-septal scar with mild peri-infarct ischemia (low risk);  c.  Echo 8/13: mild focal basal septal hypertrophy, Gr 1 DD, mild LAE;  d. Lexiscan  Myoview (06/20/2013): No reversible ischemia, inferior and septal infarct, inferior and septal hypokinesis, EF 60%;  e.  Echo (06/20/2011): EF 50-55%, normal wall motion, grade 1 diastolic dysfunction, mild LAE   Diverticular disease    Duodenitis    Elevated PSA    GERD (gastroesophageal reflux disease)    Gout    Humerus fracture    right   Hydronephrosis with ureteropelvic junction obstruction    Hypercalcemia 02/02/2012  a. 01/2012: 10.7.   Hyperlipidemia    Hypertension    Pancreatitis    a. ? Simvastatin  related   Pneumonia age 38 or 7   I think only once (06/19/2013)   Syncope    a. 06/2013   TIA (transient ischemic attack)    a. 01/2012 - presentation as acute imbalance;  b. 01/2012 carotid U/S w/o signif stenosis;  c. 01/2012 Echo: EF 60-65%, Gr 1 DD.;  d.  Carotid US  (06/20/2013): 1-39% bilateral ICA stenosis   Tubular adenoma polyp of rectum     Type II diabetes mellitus (HCC)    diet and exercise controlled at present (06/19/2013)   Past Surgical History:  Procedure Laterality Date   APPENDECTOMY     CARDIAC CATHETERIZATION  2000   CHOLECYSTECTOMY     COCHLEAR IMPLANT Right 1990's   COLONOSCOPY W/ POLYPECTOMY     no polyp 2012   CORONARY ARTERY BYPASS GRAFT  2000   CABG X5   INGUINAL HERNIA REPAIR Right 09/15/2015   Procedure: OPEN REPAIR RIGHT INGUINAL HERNIA ;  Surgeon: Krystal Russell, MD;  Location: [facility] ORS;  Service: General;  Laterality: Right;   INSERTION OF MESH Right 09/15/2015   Procedure: INSERTION OF MESH;  Surgeon: Krystal Russell, MD;  Location: [facility];  Service: General;  Laterality: Right;   PARATHYROIDECTOMY  1978   POLYPECTOMY     REVERSE SHOULDER ARTHROPLASTY Right 12/30/2014   Procedure: Right shoulder ORIF, Biomet S3 Plate and ZImmer cable system ;  Surgeon: Marcey Her, MD;  Location: [facility] OR;  Service: Orthopedics;  Laterality: Right;   right ankle surgery  age 46   TONSILLECTOMY  89   trachestomy as child for throat closing      Allergies  Allergen Reactions   Simvastatin  Other (See Comments)    Possible statin-induced pancreatitis    Allergies as of 03/06/2024       Reactions   Simvastatin  Other (See Comments)   Possible statin-induced pancreatitis        Medication List        Accurate as of March 06, 2024  2:10 PM. If you have any questions, ask your nurse or doctor.          acetaminophen  500 MG tablet Commonly known as: TYLENOL  Take 1,000 mg by mouth 2 (two) times daily.   allopurinol  100 MG tablet Commonly known as: ZYLOPRIM  Take 100 mg by mouth daily.   clopidogrel  75 MG tablet Commonly known as: PLAVIX  Take 75 mg by mouth daily.   colchicine  0.6 MG tablet Take 0.6 mg by mouth daily as needed (as directed for gout flares).   ezetimibe 10 MG tablet Commonly known as: ZETIA Take 10 mg by mouth daily.   Fenofibric Acid  135 MG Cpdr Take 1 tablet by mouth in the  morning.   finasteride  5 MG tablet Commonly known as: PROSCAR  Take 5 mg by mouth every evening.   guaiFENesin -dextromethorphan 100-10 MG/5ML syrup Commonly known as: ROBITUSSIN DM Take 10 mLs by mouth every 6 (six) hours as needed for cough.   ipratropium-albuterol 0.5-2.5 (3) MG/3ML Soln Commonly known as: DUONEB Take 3 mLs by nebulization every 6 (six) hours as needed.   lisinopril  10 MG tablet Commonly known as: ZESTRIL  Take 1 tablet (10 mg total) by mouth daily.   metFORMIN  500 MG tablet Commonly known as: GLUCOPHAGE  Take 1,000 mg by mouth every morning.   metFORMIN  500 MG tablet Commonly known as: GLUCOPHAGE  Take 750 mg by mouth every evening.  miconazole 2 % powder Commonly known as: MICOTIN Apply 1 Application topically daily at 12 noon.   polyethylene glycol powder 17 GM/SCOOP powder Commonly known as: GLYCOLAX /MIRALAX  Take 17 g by mouth 2 (two) times a week.   tamsulosin 0.4 MG Caps capsule Commonly known as: FLOMAX Take 0.4 mg by mouth at bedtime.   zinc oxide 20 % ointment Apply 1 application  topically as needed for irritation. Apply transdermally as needed for skin irritation after each incontinent episode.        Review of Systems  Constitutional:  Negative for fatigue and fever.  HENT:  Positive for hearing loss. Negative for sore throat and trouble swallowing.   Eyes:  Negative for visual disturbance.  Respiratory:  Negative for cough and shortness of breath.   Cardiovascular:  Negative for chest pain and leg swelling.  Gastrointestinal:  Negative for abdominal distention and abdominal pain.  Genitourinary:  Positive for frequency and urgency.       Incontinence  Musculoskeletal:  Positive for gait problem.  Skin:  Positive for color change.  Neurological:  Positive for weakness. Negative for dizziness and headaches.  Psychiatric/Behavioral:  Positive for confusion. Negative for dysphoric mood and sleep disturbance. The patient is not  nervous/anxious.     Immunization History  Administered Date(s) Administered   Fluad Quad(high Dose 65+) 04/01/2019, 04/11/2022   INFLUENZA, HIGH DOSE SEASONAL PF 04/17/2013, 03/16/2014, 03/05/2016, 03/18/2017, 04/17/2023   Influenza Split 04/13/2011, 05/02/2012   Influenza Whole 05/22/2007, 04/12/2008, 05/02/2009, 04/14/2010   Influenza,inj,Quad PF,6+ Mos 03/02/2015, 03/18/2018   Influenza-Unspecified 03/18/2018, 04/12/2021   Moderna Covid-19 Fall Seasonal Vaccine 67yrs & older 04/24/2023, 10/09/2023   Moderna Covid-19 Vaccine Bivalent Booster 66yrs & up 11/29/2021   Moderna SARS-COV2 Booster Vaccination 03/08/2021   Moderna Sars-Covid-2 Vaccination 06/22/2019, 07/20/2019, 05/02/2020, 11/23/2020   PNEUMOCOCCAL CONJUGATE-20 08/29/2023   PPD Test 01/02/2015   Pneumococcal Conjugate-13 12/29/2015   Pneumococcal Polysaccharide-23 04/19/2006   Respiratory Syncytial Virus Vaccine,Recomb Aduvanted(Arexvy) 05/14/2022   Tdap 09/04/2016   Unspecified SARS-COV-2 Vaccination 04/24/2022   Zoster Recombinant(Shingrix) 09/17/2019, 03/29/2020   Zoster, Live 06/29/2015   Pertinent  Health Maintenance Due  Topic Date Due   Influenza Vaccine  01/17/2024   HEMOGLOBIN A1C  07/15/2024   FOOT EXAM  08/15/2024   OPHTHALMOLOGY EXAM  11/27/2024      02/04/2023   11:25 AM 03/20/2023   12:07 PM 05/27/2023   11:29 AM 08/16/2023    2:02 PM 12/09/2023   11:32 AM  Fall Risk  Falls in the past year? 1 1 1 1 1   Was there an injury with Fall? 1 1 0 1 1  Fall Risk Category Calculator 3 3 2 3 3   Patient at Risk for Falls Due to History of fall(s);Impaired balance/gait;Impaired mobility History of fall(s);Impaired balance/gait;Impaired mobility History of fall(s);Impaired balance/gait;Impaired mobility History of fall(s);Impaired balance/gait;Impaired mobility History of fall(s);Impaired balance/gait;Impaired mobility  Fall risk Follow up Falls evaluation completed;Education provided;Falls prevention discussed  Education provided;Falls prevention discussed Falls evaluation completed;Education provided;Falls prevention discussed Falls evaluation completed;Education provided Falls evaluation completed;Education provided   Functional Status Survey:    Vitals:   03/06/24 1325  BP: (!) 131/92  Pulse: 79  Resp: 20  Temp: (!) 97.1 F (36.2 C)  SpO2: 97%  Weight: 174 lb 3.2 oz (79 kg)  Height: 5' 9 (1.753 m)   Body mass index is 25.72 kg/m. Physical Exam Vitals reviewed.  Constitutional:      General: He is not in acute distress. HENT:  Head: Normocephalic.     Ears:     Comments: Right cochlear implant Eyes:     General:        Right eye: No discharge.        Left eye: No discharge.  Cardiovascular:     Rate and Rhythm: Normal rate and regular rhythm.     Pulses: Normal pulses.     Heart sounds: Normal heart sounds.  Pulmonary:     Effort: Pulmonary effort is normal.     Breath sounds: Normal breath sounds.  Abdominal:     General: Bowel sounds are normal. There is no distension.     Palpations: Abdomen is soft.     Tenderness: There is no abdominal tenderness.  Musculoskeletal:     Cervical back: Neck supple.     Right lower leg: No edema.     Left lower leg: No edema.  Lymphadenopathy:     Cervical: No cervical adenopathy.  Skin:    General: Skin is warm.     Capillary Refill: Capillary refill takes less than 2 seconds.     Comments: Small growth resembling skin tag to right outer ear, tail/horn growing from area, non tender, no skin breakdown  Neurological:     General: No focal deficit present.     Mental Status: He is alert. Mental status is at baseline.     Motor: Weakness present.     Gait: Gait abnormal.  Psychiatric:        Mood and Affect: Mood normal.     Labs reviewed: Recent Labs    05/06/23 0000 05/21/23 0000 01/13/24 0000 01/23/24 0000  NA 140 141 141  --   K 4.1 4.2 4.2  --   CL 107 107 108  --   CO2 26* 28* 24*  --   BUN 34* 35* 24*  --    CREATININE 1.2 1.2 1.4*  --   CALCIUM 10.6 10.4 10.4 10.5   Recent Labs    05/06/23 0000  AST 16  ALT 12  ALKPHOS 35  ALBUMIN 3.9   Recent Labs    05/06/23 0000 01/13/24 0000  WBC 5.7 6.0  NEUTROABS 3,340.00  --   HGB 12.2* 12.9*  HCT 37* 39*  PLT  --  306   Lab Results  Component Value Date   TSH 1.19 01/13/2024   Lab Results  Component Value Date   HGBA1C 8.2 01/13/2024   Lab Results  Component Value Date   CHOL 167 07/26/2023   HDL 40 07/26/2023   LDLCALC 100 07/26/2023   LDLDIRECT 96.0 08/23/2020   TRIG 177 (A) 07/26/2023   CHOLHDL 4.4 10/21/2020    Significant Diagnostic Results in last 30 days:  No results found.  Assessment/Plan 1. Benign neoplasm of ear, right (Primary) - onset about 3-4 weeks ago - missed in house dermatology appointment - appears to have grown per nursing - non tender, no skin breakdown - will schedule with in house dermatology 03/2024  2. Type 2 diabetes mellitus with stage 3a chronic kidney disease, without long-term current use of insulin  (HCC) - A1c 8.2 - no hypoglycemia - metformin  increased 01/2024 - CBG's averaging 200's - cont carb mod diet  3. Atherosclerosis of native coronary artery of native heart without angina pectoris - stable with Zetia and Plavix   4. Essential hypertension, benign - controlled with lisinopril   5. Hyperlipidemia associated with type 2 diabetes mellitus (HCC) - LDL 100 07/2023 - statin allergy - cont Zetia  6. Cognitive impairment - some odd behaviors per family/ nursing - will not wear glasses or hearing aids - recent BIMS was 12/15 (08/06)> was 15/15  - dependent with ADLs except feeding - consider ST for cognitive testing in future  7. Benign prostatic hyperplasia with urinary frequency - cont finasteride  and tamsulosin  8. Nocturnal enuresis - cont scheduled toileting overnight  9. Sensory hearing loss - followed by Dr. Delores    Family/ staff Communication: plan  discussed with patient and nurse  Labs/tests ordered: schedule with in house dermatology 03/2024

## 2024-03-30 ENCOUNTER — Non-Acute Institutional Stay (SKILLED_NURSING_FACILITY): Payer: Self-pay | Admitting: Orthopedic Surgery

## 2024-03-30 ENCOUNTER — Encounter: Payer: Self-pay | Admitting: Orthopedic Surgery

## 2024-03-30 DIAGNOSIS — E1169 Type 2 diabetes mellitus with other specified complication: Secondary | ICD-10-CM

## 2024-03-30 DIAGNOSIS — D2321 Other benign neoplasm of skin of right ear and external auricular canal: Secondary | ICD-10-CM

## 2024-03-30 DIAGNOSIS — I10 Essential (primary) hypertension: Secondary | ICD-10-CM | POA: Diagnosis not present

## 2024-03-30 DIAGNOSIS — N401 Enlarged prostate with lower urinary tract symptoms: Secondary | ICD-10-CM

## 2024-03-30 DIAGNOSIS — R350 Frequency of micturition: Secondary | ICD-10-CM

## 2024-03-30 DIAGNOSIS — E1122 Type 2 diabetes mellitus with diabetic chronic kidney disease: Secondary | ICD-10-CM

## 2024-03-30 DIAGNOSIS — E785 Hyperlipidemia, unspecified: Secondary | ICD-10-CM

## 2024-03-30 DIAGNOSIS — I2510 Atherosclerotic heart disease of native coronary artery without angina pectoris: Secondary | ICD-10-CM | POA: Diagnosis not present

## 2024-03-30 DIAGNOSIS — N1831 Chronic kidney disease, stage 3a: Secondary | ICD-10-CM

## 2024-03-30 DIAGNOSIS — R4189 Other symptoms and signs involving cognitive functions and awareness: Secondary | ICD-10-CM

## 2024-03-30 DIAGNOSIS — H905 Unspecified sensorineural hearing loss: Secondary | ICD-10-CM

## 2024-03-30 DIAGNOSIS — N3944 Nocturnal enuresis: Secondary | ICD-10-CM

## 2024-03-30 NOTE — Progress Notes (Signed)
 Location:  [facility] Room Number: 11/A Place of Service:  SNF (31) Provider:  Greig FORBES Cluster, NP   Cluster Greig FORBES, NP  Patient Care Team: Cluster Greig FORBES, NP as PCP - General (Adult Health Nurse Practitioner) Delford Maude BROCKS, MD as PCP - Cardiology (Cardiology)  Extended Emergency Contact Information Primary Emergency Contact: [name],[name]          [place], [place] Home Phone: [phone] Mobile Phone: [phone] Relation: Daughter Secondary Emergency Contact: [name],[name] Address: [address]          [place], [place] [address] [place] Home Phone: [phone] Mobile Phone: [phone] Relation: Daughter  Code Status:  DNR Goals of care: Advanced Directive information    03/06/2024    2:09 PM  Advanced Directives  Does Patient Have a Medical Advance Directive? Yes  Type of Estate agent of [place];Living will;Out of facility DNR (pink MOST or yellow form)  Does patient want to make changes to medical advance directive? No - Patient declined  Copy of Healthcare Power of Attorney in Chart? Yes - validated most recent copy scanned in chart (See row information)     Chief Complaint  Patient presents with   Medical Management of Chronic Issues    HPI:  Pt is a 88 y.o. male seen today for medical management of chronic diseases.    He currently resides on the skilled nursing unit at [facility] PMH: CAD, s/p CABG 2000, HTN, TIA 2022, T2DM, HOH- cochlear implant, OA, BPH, CKD, memory deficit, shuffling gait, and weight loss.     T2DM- A1c 8.2 (07/28)> was 8.0 (02/06)> was 7.4 (10/03)> was 7.7 (06/21), blood sugars 160-180's, no hypoglycemias, urine microalbumin 31 05/06/2023, diet regular with 1/2 portion desserts, remains on metformin  CAD- H/o CABG, followed by cardiology, 10/2020 LVEF 60-65%, remains on plavix  HTN- BUN/creat 24/1.4 01/13/2024, remains on amlodipine  HLD- allergy to  simvastatin , total 167, LDL 100 07/25/2023, remains on Zetia Cognitive impairment-  MMSE 29/30 08/16/2023, BIMS score 12/15 (08/06)> was 15/15 (05/06), CT head 12/2020 atrophy and chronic microvascular ischemic changes noted, refusing care more often or noncompliant with wearing hearing aid or glasses, not on medication BPH- remains on finasteride , remains on Flomax and finasteride  Nocturnal enuresis- scheduled toileting at midnight and 4 am Sensory hearing loss- followed by Dr. Delores, uses right cochlear implant  Recent weights:  10/02- 175 lbs  09/05- 174.2 lbs  08/02- 175 lbs  Recent blood pressures:  10/07- 147/95  09/30- 142/82   09/26- 150/88   Past Medical History:  Diagnosis Date   Cancer (HCC)    skin   CKD (chronic kidney disease) 06/24/2013   Cochlear implant in place    Coronary artery disease    a. s/p CABG 2000; b. myoview 9/08: inf-septal scar with mild peri-infarct ischemia (low risk);  c.  Echo 8/13: mild focal basal septal hypertrophy, Gr 1 DD, mild LAE;  d. Lexiscan  Myoview (06/20/2013): No reversible ischemia, inferior and septal infarct, inferior and septal hypokinesis, EF 60%;  e.  Echo (06/20/2011): EF 50-55%, normal wall motion, grade 1 diastolic dysfunction, mild LAE   Diverticular disease    Duodenitis    Elevated PSA    GERD (gastroesophageal reflux disease)    Gout    Humerus fracture    right   Hydronephrosis with ureteropelvic junction obstruction    Hypercalcemia 02/02/2012   a. 01/2012: 10.7.   Hyperlipidemia    Hypertension  Pancreatitis    a. ? Simvastatin  related   Pneumonia age 72 or 7   I think only once (06/19/2013)   Syncope    a. 06/2013   TIA (transient ischemic attack)    a. 01/2012 - presentation as acute imbalance;  b. 01/2012 carotid U/S w/o signif stenosis;  c. 01/2012 Echo: EF 60-65%, Gr 1 DD.;  d.  Carotid US  (06/20/2013): 1-39% bilateral ICA stenosis   Tubular adenoma polyp of rectum    Type II diabetes mellitus (HCC)    diet and  exercise controlled at present (06/19/2013)   Past Surgical History:  Procedure Laterality Date   APPENDECTOMY     CARDIAC CATHETERIZATION  2000   CHOLECYSTECTOMY     COCHLEAR IMPLANT Right 1990's   COLONOSCOPY W/ POLYPECTOMY     no polyp 2012   CORONARY ARTERY BYPASS GRAFT  2000   CABG X5   INGUINAL HERNIA REPAIR Right 09/15/2015   Procedure: OPEN REPAIR RIGHT INGUINAL HERNIA ;  Surgeon: Krystal Russell, MD;  Location: [facility];  Service: General;  Laterality: Right;   INSERTION OF MESH Right 09/15/2015   Procedure: INSERTION OF MESH;  Surgeon: Krystal Russell, MD;  Location: [facility];  Service: General;  Laterality: Right;   PARATHYROIDECTOMY  1978   POLYPECTOMY     REVERSE SHOULDER ARTHROPLASTY Right 12/30/2014   Procedure: Right shoulder ORIF, Biomet S3 Plate and ZImmer cable system ;  Surgeon: Marcey Her, MD;  Location: [facility] OR;  Service: Orthopedics;  Laterality: Right;   right ankle surgery  age 42   TONSILLECTOMY  55   trachestomy as child for throat closing      Allergies  Allergen Reactions   Simvastatin  Other (See Comments)    Possible statin-induced pancreatitis    Outpatient Encounter Medications as of 03/30/2024  Medication Sig   acetaminophen  (TYLENOL ) 500 MG tablet Take 1,000 mg by mouth 2 (two) times daily.   allopurinol  (ZYLOPRIM ) 100 MG tablet Take 100 mg by mouth daily.   Choline Fenofibrate  (FENOFIBRIC ACID ) 135 MG CPDR Take 1 tablet by mouth in the morning.   clopidogrel  (PLAVIX ) 75 MG tablet Take 75 mg by mouth daily.   colchicine  0.6 MG tablet Take 0.6 mg by mouth daily as needed (as directed for gout flares).   ezetimibe (ZETIA) 10 MG tablet Take 10 mg by mouth daily.   finasteride  (PROSCAR ) 5 MG tablet Take 5 mg by mouth every evening.   guaiFENesin -dextromethorphan (ROBITUSSIN DM) 100-10 MG/5ML syrup Take 10 mLs by mouth every 6 (six) hours as needed for cough.   lisinopril  (ZESTRIL ) 10 MG tablet Take 1 tablet (10 mg total) by mouth daily.   metFORMIN   (GLUCOPHAGE ) 500 MG tablet Take 1,000 mg by mouth every morning.   metFORMIN  (GLUCOPHAGE ) 500 MG tablet Take 750 mg by mouth every evening.   miconazole (MICOTIN) 2 % powder Apply 1 Application topically daily at 12 noon.   polyethylene glycol powder (GLYCOLAX /MIRALAX ) 17 GM/SCOOP powder Take 17 g by mouth 2 (two) times a week.   tamsulosin (FLOMAX) 0.4 MG CAPS capsule Take 0.4 mg by mouth at bedtime.   zinc oxide 20 % ointment Apply 1 application  topically as needed for irritation. Apply transdermally as needed for skin irritation after each incontinent episode.   No facility-administered encounter medications on file as of 03/30/2024.    Review of Systems  Unable to perform ROS: Dementia    Immunization History  Administered Date(s) Administered   Fluad Quad(high Dose 65+) 04/01/2019, 04/11/2022  INFLUENZA, HIGH DOSE SEASONAL PF 04/17/2013, 03/16/2014, 03/05/2016, 03/18/2017, 04/17/2023   Influenza Split 04/13/2011, 05/02/2012   Influenza Whole 05/22/2007, 04/12/2008, 05/02/2009, 04/14/2010   Influenza,inj,Quad PF,6+ Mos 03/02/2015, 03/18/2018   Influenza-Unspecified 03/18/2018, 04/12/2021   Moderna Covid-19 Fall Seasonal Vaccine 11yrs & older 04/24/2023, 10/09/2023   Moderna Covid-19 Vaccine Bivalent Booster 43yrs & up 11/29/2021   Moderna SARS-COV2 Booster Vaccination 03/08/2021   Moderna Sars-Covid-2 Vaccination 06/22/2019, 07/20/2019, 05/02/2020, 11/23/2020   PNEUMOCOCCAL CONJUGATE-20 08/29/2023   PPD Test 01/02/2015   Pneumococcal Conjugate-13 12/29/2015   Pneumococcal Polysaccharide-23 04/19/2006   Respiratory Syncytial Virus Vaccine,Recomb Aduvanted(Arexvy) 05/14/2022   Tdap 09/04/2016   Unspecified SARS-COV-2 Vaccination 04/24/2022   Zoster Recombinant(Shingrix) 09/17/2019, 03/29/2020   Zoster, Live 06/29/2015   Pertinent  Health Maintenance Due  Topic Date Due   Influenza Vaccine  01/17/2024   HEMOGLOBIN A1C  07/15/2024   FOOT EXAM  08/15/2024   OPHTHALMOLOGY  EXAM  11/27/2024      02/04/2023   11:25 AM 03/20/2023   12:07 PM 05/27/2023   11:29 AM 08/16/2023    2:02 PM 12/09/2023   11:32 AM  Fall Risk  Falls in the past year? 1 1 1 1 1   Was there an injury with Fall? 1 1 0 1 1  Fall Risk Category Calculator 3 3 2 3 3   Patient at Risk for Falls Due to History of fall(s);Impaired balance/gait;Impaired mobility History of fall(s);Impaired balance/gait;Impaired mobility History of fall(s);Impaired balance/gait;Impaired mobility History of fall(s);Impaired balance/gait;Impaired mobility History of fall(s);Impaired balance/gait;Impaired mobility  Fall risk Follow up Falls evaluation completed;Education provided;Falls prevention discussed Education provided;Falls prevention discussed Falls evaluation completed;Education provided;Falls prevention discussed Falls evaluation completed;Education provided Falls evaluation completed;Education provided   Functional Status Survey:    Vitals:   03/30/24 1022  BP: (!) 147/95  Pulse: 85  Resp: 18  Temp: (!) 96.9 F (36.1 C)  SpO2: 93%  Weight: 175 lb (79.4 kg)  Height: 5' 9 (1.753 m)   Body mass index is 25.84 kg/m. Physical Exam Vitals reviewed.  Constitutional:      General: He is not in acute distress. HENT:     Head: Normocephalic.     Right Ear: Tympanic membrane normal.     Left Ear: Tympanic membrane normal.     Ears:     Comments: Right cochlear implant    Nose: Nose normal.     Mouth/Throat:     Mouth: Mucous membranes are moist.  Eyes:     General:        Right eye: No discharge.        Left eye: No discharge.  Cardiovascular:     Rate and Rhythm: Normal rate and regular rhythm.     Pulses: Normal pulses.     Heart sounds: Normal heart sounds.  Pulmonary:     Effort: Pulmonary effort is normal.     Breath sounds: Normal breath sounds.  Abdominal:     General: Bowel sounds are normal. There is no distension.     Palpations: Abdomen is soft.  Musculoskeletal:     Cervical back:  Neck supple.     Right lower leg: No edema.     Left lower leg: No edema.  Skin:    General: Skin is warm.     Capillary Refill: Capillary refill takes less than 2 seconds.     Comments: Skin tag to right outer ear, appears unchanged from last month  Neurological:     General: No focal deficit present.  Mental Status: He is alert. Mental status is at baseline.     Gait: Gait abnormal.  Psychiatric:        Mood and Affect: Mood normal.     Labs reviewed: Recent Labs    05/06/23 0000 05/21/23 0000 01/13/24 0000 01/23/24 0000  NA 140 141 141  --   K 4.1 4.2 4.2  --   CL 107 107 108  --   CO2 26* 28* 24*  --   BUN 34* 35* 24*  --   CREATININE 1.2 1.2 1.4*  --   CALCIUM 10.6 10.4 10.4 10.5   Recent Labs    05/06/23 0000  AST 16  ALT 12  ALKPHOS 35  ALBUMIN 3.9   Recent Labs    05/06/23 0000 01/13/24 0000  WBC 5.7 6.0  NEUTROABS 3,340.00  --   HGB 12.2* 12.9*  HCT 37* 39*  PLT  --  306   Lab Results  Component Value Date   TSH 1.19 01/13/2024   Lab Results  Component Value Date   HGBA1C 8.2 01/13/2024   Lab Results  Component Value Date   CHOL 167 07/26/2023   HDL 40 07/26/2023   LDLCALC 100 07/26/2023   LDLDIRECT 96.0 08/23/2020   TRIG 177 (A) 07/26/2023   CHOLHDL 4.4 10/21/2020    Significant Diagnostic Results in last 30 days:  No results found.  Assessment/Plan 1. Type 2 diabetes mellitus with stage 3a chronic kidney disease, without long-term current use of insulin  (HCC) (Primary) - recent A1c 8.2 - metformin  increased 01/2024 - improved CBG> 160-180's - regular diet with 1/2 dessert portion - recheck A1c 10/30  2. Atherosclerosis of native coronary artery of native heart without angina pectoris - cont Zetia and Plavix   3. Essential hypertension, benign - cont lisinopril   4. Hyperlipidemia associated with type 2 diabetes mellitus (HCC) - LDL 100 07/2023 - statin allergy - cont Zetia  5. Cognitive impairment - refusing care  more/ noncompliant with wearing cochlear implant or glasses - BIMS was 12/15 01/2024  6. Benign prostatic hyperplasia with urinary frequency - cont finasteride  and tamsulosin  7. Nocturnal enuresis - cont scheduled toileting  8. Sensory hearing loss - followed by Dr. Delores  9. Benign neoplasm of ear, right - unchanged - scheduled with in house dermatology 10/21   Family/ staff Communication: plan discussed with patient and nurse  Labs/tests ordered:  A1c 10/30, dermatology consult 10/21

## 2024-05-20 ENCOUNTER — Encounter: Payer: Self-pay | Admitting: Internal Medicine

## 2024-05-20 NOTE — Telephone Encounter (Signed)
 Skilled resident

## 2024-05-26 ENCOUNTER — Encounter: Payer: Self-pay | Admitting: Nurse Practitioner

## 2024-05-26 DIAGNOSIS — E559 Vitamin D deficiency, unspecified: Secondary | ICD-10-CM | POA: Insufficient documentation

## 2024-05-26 DIAGNOSIS — E538 Deficiency of other specified B group vitamins: Secondary | ICD-10-CM | POA: Insufficient documentation

## 2024-05-28 ENCOUNTER — Non-Acute Institutional Stay (SKILLED_NURSING_FACILITY): Payer: Self-pay | Admitting: Internal Medicine

## 2024-05-28 DIAGNOSIS — E1169 Type 2 diabetes mellitus with other specified complication: Secondary | ICD-10-CM

## 2024-05-28 DIAGNOSIS — N401 Enlarged prostate with lower urinary tract symptoms: Secondary | ICD-10-CM | POA: Diagnosis not present

## 2024-05-28 DIAGNOSIS — R350 Frequency of micturition: Secondary | ICD-10-CM | POA: Diagnosis not present

## 2024-05-28 DIAGNOSIS — E785 Hyperlipidemia, unspecified: Secondary | ICD-10-CM

## 2024-05-28 DIAGNOSIS — R4189 Other symptoms and signs involving cognitive functions and awareness: Secondary | ICD-10-CM

## 2024-05-28 DIAGNOSIS — Z7984 Long term (current) use of oral hypoglycemic drugs: Secondary | ICD-10-CM

## 2024-05-28 DIAGNOSIS — N1831 Chronic kidney disease, stage 3a: Secondary | ICD-10-CM

## 2024-05-28 DIAGNOSIS — I10 Essential (primary) hypertension: Secondary | ICD-10-CM | POA: Diagnosis not present

## 2024-05-28 DIAGNOSIS — E1122 Type 2 diabetes mellitus with diabetic chronic kidney disease: Secondary | ICD-10-CM

## 2024-05-28 NOTE — Progress Notes (Signed)
 [facility]  [facility] Service:  SNF (31)  Provider:   Code Status: DNR Goals of Care:     03/06/2024    2:09 PM  Advanced Directives  Does Patient Have a Medical Advance Directive? Yes  Type of Estate Agent of [place];Living will;Out of facility DNR (pink MOST or yellow form)  Does patient want to make changes to medical advance directive? No - Patient declined  Copy of Healthcare Power of Attorney in Chart? Yes - validated most recent copy scanned in chart (See row information)     Chief Complaint  Patient presents with   Care Management    HPI: Patient is a 88 y.o. male seen today for medical management of chronic diseases.    Lives in SNF   Patient has a history of CAD, S/P CABG in 2000, gout, HLD, hypertension and diabetes Unstable Gait No further work up per Neurology Has Auditory Implants  He is stable No Recent Falls But per Nurses he is having more cognitive issues Has lost some weight also Stays in his Wheelchair  Transfers with stand and Pivot Wt Readings from Last 3 Encounters:  05/28/24 164 lb 9.6 oz (74.7 kg)  03/30/24 175 lb (79.4 kg)  03/06/24 174 lb 3.2 oz (79 kg)    Past Medical History:  Diagnosis Date   Cancer (HCC)    skin   CKD (chronic kidney disease) 06/24/2013   Cochlear implant in place    Coronary artery disease    a. s/p CABG 2000; b. myoview 9/08: inf-septal scar with mild peri-infarct ischemia (low risk);  c.  Echo 8/13: mild focal basal septal hypertrophy, Gr 1 DD, mild LAE;  d. Lexiscan  Myoview (06/20/2013): No reversible ischemia, inferior and septal infarct, inferior and septal hypokinesis, EF 60%;  e.  Echo (06/20/2011): EF 50-55%, normal wall motion, grade 1 diastolic dysfunction, mild LAE   Diverticular disease    Duodenitis    Elevated PSA    GERD (gastroesophageal reflux disease)    Gout    Humerus fracture    right   Hydronephrosis with ureteropelvic junction obstruction     Hypercalcemia 02/02/2012   a. 01/2012: 10.7.   Hyperlipidemia    Hypertension    Pancreatitis    a. ? Simvastatin  related   Pneumonia age 47 or 7   I think only once (06/19/2013)   Syncope    a. 06/2013   TIA (transient ischemic attack)    a. 01/2012 - presentation as acute imbalance;  b. 01/2012 carotid U/S w/o signif stenosis;  c. 01/2012 Echo: EF 60-65%, Gr 1 DD.;  d.  Carotid US  (06/20/2013): 1-39% bilateral ICA stenosis   Tubular adenoma polyp of rectum    Type II diabetes mellitus (HCC)    diet and exercise controlled at present (06/19/2013)    Past Surgical History:  Procedure Laterality Date   APPENDECTOMY     CARDIAC CATHETERIZATION  2000   CHOLECYSTECTOMY     COCHLEAR IMPLANT Right 1990's   COLONOSCOPY W/ POLYPECTOMY     no polyp 2012   CORONARY ARTERY BYPASS GRAFT  2000   CABG X5   INGUINAL HERNIA REPAIR Right 09/15/2015   Procedure: OPEN REPAIR RIGHT INGUINAL HERNIA ;  Surgeon: Krystal Russell, MD;  Location: [facility];  Service: General;  Laterality: Right;   INSERTION OF MESH Right 09/15/2015   Procedure: INSERTION OF MESH;  Surgeon: Krystal Russell, MD;  Location: [facility];  Service: General;  Laterality: Right;   PARATHYROIDECTOMY  1978   POLYPECTOMY     REVERSE SHOULDER ARTHROPLASTY Right 12/30/2014   Procedure: Right shoulder ORIF, Biomet S3 Plate and ZImmer cable system ;  Surgeon: Marcey Her, MD;  Location: [facility] OR;  Service: Orthopedics;  Laterality: Right;   right ankle surgery  age 56   TONSILLECTOMY  41   trachestomy as child for throat closing      Allergies[1]  Outpatient Encounter Medications as of 05/28/2024  Medication Sig   acetaminophen  (TYLENOL ) 500 MG tablet Take 1,000 mg by mouth 2 (two) times daily.   allopurinol  (ZYLOPRIM ) 100 MG tablet Take 100 mg by mouth daily.   Choline Fenofibrate  (FENOFIBRIC ACID ) 135 MG CPDR Take 1 tablet by mouth in the morning.   clopidogrel  (PLAVIX ) 75 MG tablet Take 75 mg by mouth daily.   colchicine  0.6 MG tablet Take  0.6 mg by mouth daily as needed (as directed for gout flares).   ezetimibe (ZETIA) 10 MG tablet Take 10 mg by mouth daily.   finasteride  (PROSCAR ) 5 MG tablet Take 5 mg by mouth every evening.   guaiFENesin -dextromethorphan (ROBITUSSIN DM) 100-10 MG/5ML syrup Take 10 mLs by mouth every 6 (six) hours as needed for cough.   lisinopril  (ZESTRIL ) 10 MG tablet Take 1 tablet (10 mg total) by mouth daily.   metFORMIN  (GLUCOPHAGE ) 500 MG tablet Take 1,000 mg by mouth every morning.   metFORMIN  (GLUCOPHAGE ) 500 MG tablet Take 750 mg by mouth every evening.   miconazole (MICOTIN) 2 % powder Apply 1 Application topically daily at 12 noon.   polyethylene glycol powder (GLYCOLAX /MIRALAX ) 17 GM/SCOOP powder Take 17 g by mouth 2 (two) times a week.   tamsulosin (FLOMAX) 0.4 MG CAPS capsule Take 0.4 mg by mouth at bedtime.   zinc oxide 20 % ointment Apply 1 application  topically as needed for irritation. Apply transdermally as needed for skin irritation after each incontinent episode.   No facility-administered encounter medications on file as of 05/28/2024.    Review of Systems:  Review of Systems  Constitutional:  Positive for unexpected weight change. Negative for activity change and appetite change.  HENT: Negative.    Respiratory:  Negative for cough and shortness of breath.   Cardiovascular:  Negative for leg swelling.  Gastrointestinal:  Negative for constipation.  Genitourinary:  Negative for frequency.  Musculoskeletal:  Positive for gait problem. Negative for arthralgias and myalgias.  Skin: Negative.  Negative for rash.  Neurological:  Negative for dizziness and weakness.  Psychiatric/Behavioral:  Positive for confusion. Negative for sleep disturbance.   All other systems reviewed and are negative.   Health Maintenance  Topic Date Due   Influenza Vaccine  01/17/2024   COVID-19 Vaccine (9 - 2025-26 season) 02/17/2024   HEMOGLOBIN A1C  07/15/2024   FOOT EXAM  08/15/2024   OPHTHALMOLOGY  EXAM  11/27/2024   DTaP/Tdap/Td (2 - Td or Tdap) 09/05/2026   Pneumococcal Vaccine: 50+ Years  Completed   Zoster Vaccines- Shingrix  Completed   Meningococcal B Vaccine  Aged Out    Physical Exam: Vitals:   05/28/24 1749  BP: (!) 140/80  Pulse: 86  Resp: 20  Temp: 98.1 F (36.7 C)  Weight: 164 lb 9.6 oz (74.7 kg)   Body mass index is 24.31 kg/m. Physical Exam Vitals reviewed.  Constitutional:      Appearance: Normal appearance.  HENT:     Head: Normocephalic.     Nose: Nose normal.     Mouth/Throat:  Mouth: Mucous membranes are moist.     Pharynx: Oropharynx is clear.  Eyes:     Pupils: Pupils are equal, round, and reactive to light.  Cardiovascular:     Rate and Rhythm: Normal rate and regular rhythm.     Pulses: Normal pulses.     Heart sounds: No murmur heard. Pulmonary:     Effort: Pulmonary effort is normal. No respiratory distress.     Breath sounds: Normal breath sounds. No rales.  Abdominal:     General: Abdomen is flat. Bowel sounds are normal.     Palpations: Abdomen is soft.  Musculoskeletal:        General: No swelling.     Cervical back: Neck supple.  Skin:    General: Skin is warm.     Comments: Small Dry Skin Lesion on right ear  Neurological:     General: No focal deficit present.     Mental Status: He is alert.  Psychiatric:        Mood and Affect: Mood normal.        Thought Content: Thought content normal.     Labs reviewed: Basic Metabolic Panel: Recent Labs    12/02/23 0000 01/13/24 0000 01/23/24 0000  NA  --  141  --   K  --  4.2  --   CL  --  108  --   CO2  --  24*  --   BUN  --  24*  --   CREATININE  --  1.4*  --   CALCIUM  --  10.4 10.5  TSH 2.15 1.19  --    Liver Function Tests: No results for input(s): AST, ALT, ALKPHOS, BILITOT, PROT, ALBUMIN in the last 8760 hours. No results for input(s): LIPASE, AMYLASE in the last 8760 hours. No results for input(s): AMMONIA in the last 8760  hours. CBC: Recent Labs    01/13/24 0000  WBC 6.0  HGB 12.9*  HCT 39*  PLT 306   Lipid Panel: Recent Labs    07/26/23 0000  CHOL 167  HDL 40  LDLCALC 100  TRIG 822*   Lab Results  Component Value Date   HGBA1C 8.2 01/13/2024    Procedures since last visit: No results found.  Assessment/Plan 1. Type 2 diabetes mellitus with stage 3a chronic kidney disease, without long-term current use of insulin  (HCC) (Primary) Recent A1C was 7.1 on 03/2024 On Metformin   2. Essential hypertension, benign Lisinopril   3. Hyperlipidemia associated with type 2 diabetes mellitus (HCC) On Zetia  Allergic to Statin Last LDL was 100 in 3/25 4. Cognitive impairment Recent Worsening per Nurses Able to manage in SNF  5. Benign prostatic hyperplasia with urinary frequency Flomax and Proscar  6 History of TIA (transient ischemic attack) Zetia and Plavix  7 Skin lesion Continue to monitor   8 Some weight loss noticed Will keep Monitor 9 Gout Stable on Allopurinol  10 Hearing loss Has Auditory Implants  Labs/tests ordered:  * No order type specified * Next appt:  Visit date not found         [1]  Allergies Allergen Reactions   Simvastatin  Other (See Comments)    Possible statin-induced pancreatitis

## 2024-05-31 ENCOUNTER — Encounter: Payer: Self-pay | Admitting: Internal Medicine

## 2024-07-01 ENCOUNTER — Non-Acute Institutional Stay (SKILLED_NURSING_FACILITY): Payer: Self-pay | Admitting: Adult Health

## 2024-07-01 ENCOUNTER — Encounter: Payer: Self-pay | Admitting: Adult Health

## 2024-07-01 DIAGNOSIS — N401 Enlarged prostate with lower urinary tract symptoms: Secondary | ICD-10-CM

## 2024-07-01 DIAGNOSIS — Z7984 Long term (current) use of oral hypoglycemic drugs: Secondary | ICD-10-CM

## 2024-07-01 DIAGNOSIS — I10 Essential (primary) hypertension: Secondary | ICD-10-CM | POA: Diagnosis not present

## 2024-07-01 DIAGNOSIS — N138 Other obstructive and reflux uropathy: Secondary | ICD-10-CM

## 2024-07-01 DIAGNOSIS — R410 Disorientation, unspecified: Secondary | ICD-10-CM

## 2024-07-01 DIAGNOSIS — E1122 Type 2 diabetes mellitus with diabetic chronic kidney disease: Secondary | ICD-10-CM | POA: Diagnosis not present

## 2024-07-01 DIAGNOSIS — N1831 Chronic kidney disease, stage 3a: Secondary | ICD-10-CM

## 2024-07-01 NOTE — Progress Notes (Signed)
 "  Location:  [facility] Room Number: [facility] NRSG N11-A Place of Service:  SNF (31) Provider:  Medina-Vargas, Trenisha Lafavor, DNP, FNP-BC  Patient Care Team: Charlanne Fredia CROME, MD as PCP - General (Internal Medicine) Delford Maude BROCKS, MD as PCP - Cardiology (Cardiology)  Extended Emergency Contact Information Primary Emergency Contact: [name],[name]          [place], [place] Home Phone: [phone] Mobile Phone: [phone] Relation: Daughter Secondary Emergency Contact: [name],[name] Address: [address]          [place], [place] [address] [place] Home Phone: [phone] Mobile Phone: [phone] Relation: Daughter  Code Status:  DNR  Goals of care: Advanced Directive information    07/01/2024    2:12 PM  Advanced Directives  Does Patient Have a Medical Advance Directive? Yes  Type of Advance Directive Out of facility DNR (pink MOST or yellow form)  Does patient want to make changes to medical advance directive? No - Patient declined     Chief Complaint  Patient presents with   Acute Visit    Increase confusion    HPI:  Pt is a 89 y.o. male seen today for an acute visit regarding increasing confusion. He is a resident of [id number]. He was reported to have eaten dinner and insisted to be taken back to the dining room to eat since he does not remember eating. He ate another dinner but refused to leave the dining area. He stated that he needed to eat breakfast. When he came back to his room, he continued to say I don't know how I missed breakfast and lunch.  He was seen in his room today. He is pleasant. Saff reported that his urine is a little bit dark. He denies having difficulty urinating.  He takes Flomax  and Finasteride  for BPH.  BP 173/93, 148/83, takes Lisinopril  10 mg daily. He denies headache nor dizziness.  She has type 2 diabetes and takes Metformin , last A1C 6.9   Past Medical History:   Diagnosis Date   Cancer (HCC)    skin   CKD (chronic kidney disease) 06/24/2013   Cochlear implant in place    Coronary artery disease    a. s/p CABG 2000; b. myoview 9/08: inf-septal scar with mild peri-infarct ischemia (low risk);  c.  Echo 8/13: mild focal basal septal hypertrophy, Gr 1 DD, mild LAE;  d. Lexiscan  Myoview (06/20/2013): No reversible ischemia, inferior and septal infarct, inferior and septal hypokinesis, EF 60%;  e.  Echo (06/20/2011): EF 50-55%, normal wall motion, grade 1 diastolic dysfunction, mild LAE   Diverticular disease    Duodenitis    Elevated PSA    GERD (gastroesophageal reflux disease)    Gout    Humerus fracture    right   Hydronephrosis with ureteropelvic junction obstruction    Hypercalcemia 02/02/2012   a. 01/2012: 10.7.   Hyperlipidemia    Hypertension    Pancreatitis    a. ? Simvastatin  related   Pneumonia age 76 or 7   I think only once (06/19/2013)   Syncope    a. 06/2013   TIA (transient ischemic attack)    a. 01/2012 - presentation as acute imbalance;  b. 01/2012 carotid U/S w/o signif stenosis;  c. 01/2012 Echo: EF 60-65%, Gr 1 DD.;  d.  Carotid US  (06/20/2013): 1-39% bilateral ICA stenosis   Tubular adenoma polyp of rectum    Type II diabetes mellitus (HCC)  diet and exercise controlled at present (06/19/2013)   Past Surgical History:  Procedure Laterality Date   APPENDECTOMY     CARDIAC CATHETERIZATION  2000   CHOLECYSTECTOMY     COCHLEAR IMPLANT Right 1990's   COLONOSCOPY W/ POLYPECTOMY     no polyp 2012   CORONARY ARTERY BYPASS GRAFT  2000   CABG X5   INGUINAL HERNIA REPAIR Right 09/15/2015   Procedure: OPEN REPAIR RIGHT INGUINAL HERNIA ;  Surgeon: Krystal Russell, MD;  Location: [facility];  Service: General;  Laterality: Right;   INSERTION OF MESH Right 09/15/2015   Procedure: INSERTION OF MESH;  Surgeon: Krystal Russell, MD;  Location: [facility];  Service: General;  Laterality: Right;   PARATHYROIDECTOMY  1978   POLYPECTOMY     REVERSE  SHOULDER ARTHROPLASTY Right 12/30/2014   Procedure: Right shoulder ORIF, Biomet S3 Plate and ZImmer cable system ;  Surgeon: Marcey Her, MD;  Location: [facility] OR;  Service: Orthopedics;  Laterality: Right;   right ankle surgery  age 46   TONSILLECTOMY  38   trachestomy as child for throat closing      Allergies[1]  Outpatient Encounter Medications as of 07/01/2024  Medication Sig   acetaminophen  (TYLENOL ) 500 MG tablet Take 1,000 mg by mouth 2 (two) times daily.   allopurinol  (ZYLOPRIM ) 100 MG tablet Take 100 mg by mouth daily.   Cholecalciferol 25 MCG/0.03ML LIQD Take 25 mcg by mouth daily.   Choline Fenofibrate  (FENOFIBRIC ACID ) 135 MG CPDR Take 1 tablet by mouth in the morning.   clopidogrel  (PLAVIX ) 75 MG tablet Take 75 mg by mouth daily.   colchicine  0.6 MG tablet Take 0.6 mg by mouth daily as needed (as directed for gout flares).   ezetimibe (ZETIA) 10 MG tablet Take 10 mg by mouth daily.   finasteride  (PROSCAR ) 5 MG tablet Take 5 mg by mouth every evening.   guaiFENesin -dextromethorphan (ROBITUSSIN DM) 100-10 MG/5ML syrup Take 10 mLs by mouth every 6 (six) hours as needed for cough.   lisinopril  (ZESTRIL ) 10 MG tablet Take 1 tablet (10 mg total) by mouth daily.   metFORMIN  (GLUCOPHAGE ) 500 MG tablet Take 1,000 mg by mouth every morning.   metFORMIN  (GLUCOPHAGE ) 500 MG tablet Take 750 mg by mouth every evening.   miconazole (MICOTIN) 2 % powder Apply 1 Application topically daily at 12 noon.   polyethylene glycol powder (GLYCOLAX /MIRALAX ) 17 GM/SCOOP powder Take 17 g by mouth 2 (two) times a week.   tamsulosin (FLOMAX) 0.4 MG CAPS capsule Take 0.4 mg by mouth at bedtime.   zinc oxide 20 % ointment Apply 1 application  topically as needed for irritation. Apply transdermally as needed for skin irritation after each incontinent episode.   No facility-administered encounter medications on file as of 07/01/2024.    Review of Systems  Constitutional:  Negative for activity change,  appetite change and fever.  HENT:  Positive for hearing loss. Negative for sore throat.   Eyes: Negative.   Cardiovascular:  Negative for chest pain and leg swelling.  Gastrointestinal:  Negative for abdominal distention, diarrhea and vomiting.  Genitourinary:  Negative for dysuria, frequency and urgency.  Skin:  Negative for color change.  Neurological:  Negative for dizziness and headaches.  Psychiatric/Behavioral:  Positive for confusion. Negative for behavioral problems and sleep disturbance. The patient is not nervous/anxious.       Immunization History  Administered Date(s) Administered   Fluad Quad(high Dose 65+) 04/01/2019, 04/11/2022   INFLUENZA, HIGH DOSE SEASONAL PF 04/17/2013, 03/16/2014, 03/05/2016,  03/18/2017, 04/17/2023   Influenza Split 04/13/2011, 05/02/2012   Influenza Whole 05/22/2007, 04/12/2008, 05/02/2009, 04/14/2010   Influenza,inj,Quad PF,6+ Mos 03/02/2015, 03/18/2018   Influenza-Unspecified 03/18/2018, 04/12/2021, 03/24/2024   Moderna Covid-19 Fall Seasonal Vaccine 21yrs & older 04/24/2023, 10/09/2023   Moderna Covid-19 Vaccine Bivalent Booster 91yrs & up 11/29/2021   Moderna SARS-COV2 Booster Vaccination 03/08/2021   Moderna Sars-Covid-2 Vaccination 06/22/2019, 07/20/2019, 05/02/2020, 11/23/2020   PNEUMOCOCCAL CONJUGATE-20 08/29/2023   PPD Test 01/02/2015   Pneumococcal Conjugate-13 12/29/2015   Pneumococcal Polysaccharide-23 04/19/2006   Respiratory Syncytial Virus Vaccine,Recomb Aduvanted(Arexvy) 05/14/2022   Tdap 09/04/2016   Unspecified SARS-COV-2 Vaccination 04/24/2022, 04/14/2024   Zoster Recombinant(Shingrix) 09/17/2019, 03/29/2020   Zoster, Live 06/29/2015   Pertinent  Health Maintenance Due  Topic Date Due   HEMOGLOBIN A1C  07/15/2024   FOOT EXAM  08/15/2024   OPHTHALMOLOGY EXAM  11/27/2024   Influenza Vaccine  Completed      03/20/2023   12:07 PM 05/27/2023   11:29 AM 08/16/2023    2:02 PM 12/09/2023   11:32 AM 03/30/2024   11:04 AM   Fall Risk  Falls in the past year? 1 1 1 1 1   Was there an injury with Fall? 1  0  1  1  1    Fall Risk Category Calculator 3 2 3 3 3   Patient at Risk for Falls Due to History of fall(s);Impaired balance/gait;Impaired mobility History of fall(s);Impaired balance/gait;Impaired mobility History of fall(s);Impaired balance/gait;Impaired mobility History of fall(s);Impaired balance/gait;Impaired mobility History of fall(s);Impaired balance/gait;Impaired mobility  Fall risk Follow up Education provided;Falls prevention discussed Falls evaluation completed;Education provided;Falls prevention discussed Falls evaluation completed;Education provided Falls evaluation completed;Education provided Falls evaluation completed     Data saved with a previous flowsheet row definition     Vitals:   07/01/24 1356  BP: (!) 173/93  Pulse: 80  Resp: 17  Temp: 97.6 F (36.4 C)  SpO2: 96%  Weight: 164 lb (74.4 kg)   Body mass index is 24.22 kg/m.  Physical Exam Constitutional:      General: He is not in acute distress.    Appearance: Normal appearance.  HENT:     Head: Normocephalic and atraumatic.     Ears:     Comments: Right ear cochlear implant    Mouth/Throat:     Mouth: Mucous membranes are moist.  Eyes:     Conjunctiva/sclera: Conjunctivae normal.  Cardiovascular:     Rate and Rhythm: Normal rate and regular rhythm.     Pulses: Normal pulses.     Heart sounds: Normal heart sounds.  Pulmonary:     Effort: Pulmonary effort is normal.     Breath sounds: Normal breath sounds.  Abdominal:     General: Bowel sounds are normal.     Palpations: Abdomen is soft.  Musculoskeletal:        General: No swelling. Normal range of motion.     Cervical back: Normal range of motion.  Skin:    General: Skin is warm and dry.  Neurological:     Mental Status: He is alert.  Psychiatric:        Mood and Affect: Mood normal.        Behavior: Behavior normal.        Thought Content: Thought content  normal.        Judgment: Judgment normal.      Labs reviewed: Recent Labs    01/13/24 0000 01/23/24 0000  NA 141  --   K 4.2  --  CL 108  --   CO2 24*  --   BUN 24*  --   CREATININE 1.4*  --   CALCIUM 10.4 10.5   No results for input(s): AST, ALT, ALKPHOS, BILITOT, PROT, ALBUMIN in the last 8760 hours. Recent Labs    01/13/24 0000  WBC 6.0  HGB 12.9*  HCT 39*  PLT 306   Lab Results  Component Value Date   TSH 1.19 01/13/2024   Lab Results  Component Value Date   HGBA1C 8.2 01/13/2024   Lab Results  Component Value Date   CHOL 167 07/26/2023   HDL 40 07/26/2023   LDLCALC 100 07/26/2023   LDLDIRECT 96.0 08/23/2020   TRIG 177 (A) 07/26/2023   CHOLHDL 4.4 10/21/2020    Significant Diagnostic Results in last 30 days:  No results found.  Assessment/Plan  1. Primary hypertension (Primary) -  BP elevated -  will increase Lisinopril  10 mg daily to Lisinopril  20 mg daily -  monitor BP  2. Type 2 diabetes mellitus with stage 3a chronic kidney disease, without long-term current use of insulin  (HCC) -  A1C 6.9, 05/25/24 -  continue Metformin  1,000 mg Q AM and 750 mg Q evening   3. Benign prostatic hyperplasia with urinary obstruction -  continue Flomax 0.4 mg daily -  continue Finasteride  5 mg daily  4. Acute confusion -  will do urinalysis with culture and sensitivity to rule out UTI  -  adjusted Lisinopril  for better control of BP   Family/ staff Communication: Discussed plan of care with resident and charge nurse  Labs/tests ordered: urinalysis with culture and sensitivity    Jereld Serum, DNP, MSN, FNP-BC [facility] [phone] (Monday-Friday 8:00 a.m. - 5:00 p.m.) [phone] (after hours)      [1]  Allergies Allergen Reactions   Simvastatin  Other (See Comments)    Possible statin-induced pancreatitis   "

## 2024-07-10 ENCOUNTER — Emergency Department (HOSPITAL_COMMUNITY)

## 2024-07-10 ENCOUNTER — Observation Stay (HOSPITAL_COMMUNITY)
Admission: EM | Admit: 2024-07-10 | Discharge: 2024-07-11 | Disposition: A | Discharge status: Skilled Nursing Facility | Attending: Internal Medicine | Admitting: Internal Medicine

## 2024-07-10 ENCOUNTER — Other Ambulatory Visit: Payer: Self-pay

## 2024-07-10 ENCOUNTER — Observation Stay (HOSPITAL_COMMUNITY)

## 2024-07-10 ENCOUNTER — Encounter (HOSPITAL_COMMUNITY): Payer: Self-pay

## 2024-07-10 DIAGNOSIS — Z85828 Personal history of other malignant neoplasm of skin: Secondary | ICD-10-CM | POA: Insufficient documentation

## 2024-07-10 DIAGNOSIS — N189 Chronic kidney disease, unspecified: Secondary | ICD-10-CM | POA: Diagnosis present

## 2024-07-10 DIAGNOSIS — I129 Hypertensive chronic kidney disease with stage 1 through stage 4 chronic kidney disease, or unspecified chronic kidney disease: Secondary | ICD-10-CM | POA: Insufficient documentation

## 2024-07-10 DIAGNOSIS — E1129 Type 2 diabetes mellitus with other diabetic kidney complication: Secondary | ICD-10-CM | POA: Diagnosis present

## 2024-07-10 DIAGNOSIS — Z951 Presence of aortocoronary bypass graft: Secondary | ICD-10-CM | POA: Diagnosis not present

## 2024-07-10 DIAGNOSIS — E538 Deficiency of other specified B group vitamins: Secondary | ICD-10-CM | POA: Diagnosis not present

## 2024-07-10 DIAGNOSIS — Z79899 Other long term (current) drug therapy: Secondary | ICD-10-CM | POA: Diagnosis not present

## 2024-07-10 DIAGNOSIS — R569 Unspecified convulsions: Secondary | ICD-10-CM | POA: Diagnosis not present

## 2024-07-10 DIAGNOSIS — I6381 Other cerebral infarction due to occlusion or stenosis of small artery: Principal | ICD-10-CM

## 2024-07-10 DIAGNOSIS — E559 Vitamin D deficiency, unspecified: Secondary | ICD-10-CM | POA: Diagnosis not present

## 2024-07-10 DIAGNOSIS — Z87891 Personal history of nicotine dependence: Secondary | ICD-10-CM | POA: Diagnosis not present

## 2024-07-10 DIAGNOSIS — E782 Mixed hyperlipidemia: Secondary | ICD-10-CM | POA: Diagnosis not present

## 2024-07-10 DIAGNOSIS — R8281 Pyuria: Secondary | ICD-10-CM | POA: Insufficient documentation

## 2024-07-10 DIAGNOSIS — I639 Cerebral infarction, unspecified: Secondary | ICD-10-CM | POA: Diagnosis not present

## 2024-07-10 DIAGNOSIS — R29818 Other symptoms and signs involving the nervous system: Secondary | ICD-10-CM | POA: Diagnosis present

## 2024-07-10 DIAGNOSIS — I6350 Cerebral infarction due to unspecified occlusion or stenosis of unspecified cerebral artery: Secondary | ICD-10-CM | POA: Diagnosis not present

## 2024-07-10 DIAGNOSIS — H9190 Unspecified hearing loss, unspecified ear: Secondary | ICD-10-CM

## 2024-07-10 DIAGNOSIS — N179 Acute kidney failure, unspecified: Secondary | ICD-10-CM

## 2024-07-10 DIAGNOSIS — G459 Transient cerebral ischemic attack, unspecified: Secondary | ICD-10-CM | POA: Diagnosis not present

## 2024-07-10 DIAGNOSIS — Z8739 Personal history of other diseases of the musculoskeletal system and connective tissue: Secondary | ICD-10-CM

## 2024-07-10 DIAGNOSIS — N401 Enlarged prostate with lower urinary tract symptoms: Secondary | ICD-10-CM | POA: Diagnosis not present

## 2024-07-10 DIAGNOSIS — I7090 Unspecified atherosclerosis: Secondary | ICD-10-CM

## 2024-07-10 DIAGNOSIS — N138 Other obstructive and reflux uropathy: Secondary | ICD-10-CM | POA: Diagnosis present

## 2024-07-10 DIAGNOSIS — R29704 NIHSS score 4: Secondary | ICD-10-CM

## 2024-07-10 DIAGNOSIS — Z9621 Cochlear implant status: Secondary | ICD-10-CM | POA: Insufficient documentation

## 2024-07-10 DIAGNOSIS — I6389 Other cerebral infarction: Principal | ICD-10-CM | POA: Insufficient documentation

## 2024-07-10 DIAGNOSIS — Z7984 Long term (current) use of oral hypoglycemic drugs: Secondary | ICD-10-CM | POA: Insufficient documentation

## 2024-07-10 DIAGNOSIS — N1831 Chronic kidney disease, stage 3a: Secondary | ICD-10-CM | POA: Diagnosis not present

## 2024-07-10 DIAGNOSIS — I10 Essential (primary) hypertension: Secondary | ICD-10-CM | POA: Diagnosis present

## 2024-07-10 DIAGNOSIS — R4781 Slurred speech: Principal | ICD-10-CM

## 2024-07-10 DIAGNOSIS — Z96611 Presence of right artificial shoulder joint: Secondary | ICD-10-CM | POA: Diagnosis not present

## 2024-07-10 DIAGNOSIS — Z7902 Long term (current) use of antithrombotics/antiplatelets: Secondary | ICD-10-CM | POA: Diagnosis not present

## 2024-07-10 DIAGNOSIS — E1122 Type 2 diabetes mellitus with diabetic chronic kidney disease: Secondary | ICD-10-CM | POA: Diagnosis not present

## 2024-07-10 DIAGNOSIS — I2510 Atherosclerotic heart disease of native coronary artery without angina pectoris: Secondary | ICD-10-CM | POA: Diagnosis present

## 2024-07-10 LAB — DIFFERENTIAL
Abs Immature Granulocytes: 0.04 K/uL (ref 0.00–0.07)
Basophils Absolute: 0 K/uL (ref 0.0–0.1)
Basophils Relative: 0 %
Eosinophils Absolute: 0.2 K/uL (ref 0.0–0.5)
Eosinophils Relative: 3 %
Immature Granulocytes: 1 %
Lymphocytes Relative: 24 %
Lymphs Abs: 1.6 K/uL (ref 0.7–4.0)
Monocytes Absolute: 0.5 K/uL (ref 0.1–1.0)
Monocytes Relative: 8 %
Neutro Abs: 4.2 K/uL (ref 1.7–7.7)
Neutrophils Relative %: 64 %

## 2024-07-10 LAB — I-STAT CHEM 8, ED
BUN: 36 mg/dL — ABNORMAL HIGH (ref 8–23)
Calcium, Ion: 1.3 mmol/L (ref 1.15–1.40)
Chloride: 106 mmol/L (ref 98–111)
Creatinine, Ser: 1.4 mg/dL — ABNORMAL HIGH (ref 0.61–1.24)
Glucose, Bld: 171 mg/dL — ABNORMAL HIGH (ref 70–99)
HCT: 36 % — ABNORMAL LOW (ref 39.0–52.0)
Hemoglobin: 12.2 g/dL — ABNORMAL LOW (ref 13.0–17.0)
Potassium: 4.1 mmol/L (ref 3.5–5.1)
Sodium: 141 mmol/L (ref 135–145)
TCO2: 23 mmol/L (ref 22–32)

## 2024-07-10 LAB — URINALYSIS, ROUTINE W REFLEX MICROSCOPIC
Bilirubin Urine: NEGATIVE
Glucose, UA: NEGATIVE mg/dL
Ketones, ur: NEGATIVE mg/dL
Nitrite: POSITIVE — AB
Protein, ur: 100 mg/dL — AB
Specific Gravity, Urine: 1.027 (ref 1.005–1.030)
WBC, UA: >50 WBC/hpf (ref 0–5)
pH: 5 (ref 5.0–8.0)

## 2024-07-10 LAB — COMPREHENSIVE METABOLIC PANEL WITH GFR
ALT: 15 U/L (ref 0–44)
AST: 36 U/L (ref 15–41)
Albumin: 3.9 g/dL (ref 3.5–5.0)
Alkaline Phosphatase: 48 U/L (ref 38–126)
Anion gap: 12 (ref 5–15)
BUN: 28 mg/dL — ABNORMAL HIGH (ref 8–23)
CO2: 22 mmol/L (ref 22–32)
Calcium: 10.6 mg/dL — ABNORMAL HIGH (ref 8.9–10.3)
Chloride: 103 mmol/L (ref 98–111)
Creatinine, Ser: 1.28 mg/dL — ABNORMAL HIGH (ref 0.61–1.24)
GFR, Estimated: 55 mL/min — ABNORMAL LOW
Glucose, Bld: 175 mg/dL — ABNORMAL HIGH (ref 70–99)
Potassium: 4.1 mmol/L (ref 3.5–5.1)
Sodium: 137 mmol/L (ref 135–145)
Total Bilirubin: 0.3 mg/dL (ref 0.0–1.2)
Total Protein: 6.7 g/dL (ref 6.5–8.1)

## 2024-07-10 LAB — CBC
HCT: 37.5 % — ABNORMAL LOW (ref 39.0–52.0)
HCT: 34.7 % — ABNORMAL LOW (ref 39.0–52.0)
Hemoglobin: 12.3 g/dL — ABNORMAL LOW (ref 13.0–17.0)
Hemoglobin: 11.6 g/dL — ABNORMAL LOW (ref 13.0–17.0)
MCH: 30.1 pg (ref 26.0–34.0)
MCH: 29.4 pg (ref 26.0–34.0)
MCHC: 32.8 g/dL (ref 30.0–36.0)
MCHC: 33.4 g/dL (ref 30.0–36.0)
MCV: 91.7 fL (ref 80.0–100.0)
MCV: 88.1 fL (ref 80.0–100.0)
Platelets: 327 K/uL (ref 150–400)
Platelets: 352 K/uL (ref 150–400)
RBC: 4.09 MIL/uL — ABNORMAL LOW (ref 4.22–5.81)
RBC: 3.94 MIL/uL — ABNORMAL LOW (ref 4.22–5.81)
RDW: 13.7 % (ref 11.5–15.5)
RDW: 13.7 % (ref 11.5–15.5)
WBC: 6.7 K/uL (ref 4.0–10.5)
WBC: 7 K/uL (ref 4.0–10.5)
nRBC: 0 % (ref 0.0–0.2)
nRBC: 0 % (ref 0.0–0.2)

## 2024-07-10 LAB — PROTIME-INR
INR: 1 (ref 0.8–1.2)
INR: 1 (ref 0.8–1.2)
Prothrombin Time: 13.7 s (ref 11.4–15.2)
Prothrombin Time: 14.1 s (ref 11.4–15.2)

## 2024-07-10 LAB — PHOSPHORUS: Phosphorus: 2.6 mg/dL (ref 2.5–4.6)

## 2024-07-10 LAB — ETHANOL: Alcohol, Ethyl (B): <15 mg/dL

## 2024-07-10 LAB — MAGNESIUM: Magnesium: 1.5 mg/dL — ABNORMAL LOW (ref 1.7–2.4)

## 2024-07-10 LAB — CBG MONITORING, ED: Glucose-Capillary: 165 mg/dL — ABNORMAL HIGH (ref 70–99)

## 2024-07-10 LAB — GLUCOSE, CAPILLARY
Glucose-Capillary: 161 mg/dL — ABNORMAL HIGH (ref 70–99)
Glucose-Capillary: 102 mg/dL — ABNORMAL HIGH (ref 70–99)

## 2024-07-10 LAB — APTT: aPTT: 22 s — ABNORMAL LOW (ref 24–36)

## 2024-07-10 MED ORDER — ACETAMINOPHEN 650 MG RE SUPP: 650.0000 mg | Freq: Four times a day (QID) | RECTAL | Status: DC | PRN

## 2024-07-10 MED ORDER — ALLOPURINOL 100 MG PO TABS
100.0000 mg | ORAL_TABLET | Freq: Every day | ORAL | Status: DC
  Administered 2024-07-11: 100 mg via ORAL
  Filled 2024-07-10: qty 1

## 2024-07-10 MED ORDER — OXYCODONE HCL 5 MG PO TABS: 5.0000 mg | ORAL_TABLET | ORAL | Status: DC | PRN

## 2024-07-10 MED ORDER — TAMSULOSIN HCL 0.4 MG PO CAPS
0.4000 mg | ORAL_CAPSULE | Freq: Every day | ORAL | Status: DC
  Filled 2024-07-10: qty 1

## 2024-07-10 MED ORDER — ACETAMINOPHEN 325 MG PO TABS: 650.0000 mg | ORAL_TABLET | Freq: Four times a day (QID) | ORAL | Status: DC | PRN

## 2024-07-10 MED ORDER — FLEET ENEMA RE ENEM: 1.0000 | ENEMA | Freq: Once | RECTAL | Status: DC | PRN

## 2024-07-10 MED ORDER — SODIUM CHLORIDE 0.9% FLUSH
3.0000 mL | Freq: Two times a day (BID) | INTRAVENOUS | Status: DC
  Administered 2024-07-10 – 2024-07-11 (×2): 3 mL via INTRAVENOUS

## 2024-07-10 MED ORDER — ASPIRIN 300 MG RE SUPP: 600.0000 mg | Freq: Once | RECTAL | Status: AC

## 2024-07-10 MED ORDER — INSULIN ASPART 100 UNIT/ML IJ SOLN
0.0000 [IU] | Freq: Three times a day (TID) | INTRAMUSCULAR | Status: DC
  Administered 2024-07-10: 3 [IU] via SUBCUTANEOUS
  Filled 2024-07-10: qty 3

## 2024-07-10 MED ORDER — EZETIMIBE 10 MG PO TABS
10.0000 mg | ORAL_TABLET | Freq: Every day | ORAL | Status: DC
  Administered 2024-07-11: 10 mg via ORAL
  Filled 2024-07-10: qty 1

## 2024-07-10 MED ORDER — SODIUM CHLORIDE 0.9 % IV SOLN: INTRAVENOUS | Status: DC

## 2024-07-10 MED ORDER — HYDROMORPHONE HCL 1 MG/ML IJ SOLN: 0.5000 mg | INTRAMUSCULAR | Status: DC | PRN

## 2024-07-10 MED ORDER — FINASTERIDE 5 MG PO TABS
5.0000 mg | ORAL_TABLET | Freq: Every evening | ORAL | Status: DC
  Filled 2024-07-10: qty 1

## 2024-07-10 MED ORDER — IPRATROPIUM BROMIDE 0.02 % IN SOLN: 0.5000 mg | Freq: Four times a day (QID) | RESPIRATORY_TRACT | Status: DC | PRN

## 2024-07-10 MED ORDER — TRAZODONE HCL 50 MG PO TABS: 25.0000 mg | ORAL_TABLET | Freq: Every evening | ORAL | Status: DC | PRN

## 2024-07-10 MED ORDER — CLOPIDOGREL BISULFATE 75 MG PO TABS: 75.0000 mg | ORAL_TABLET | Freq: Every day | ORAL | Status: DC

## 2024-07-10 MED ORDER — SENNOSIDES-DOCUSATE SODIUM 8.6-50 MG PO TABS: 1.0000 | ORAL_TABLET | Freq: Every evening | ORAL | Status: DC | PRN

## 2024-07-10 MED ORDER — STROKE: EARLY STAGES OF RECOVERY BOOK
Freq: Once | Status: AC
  Filled 2024-07-10: qty 1

## 2024-07-10 MED ORDER — FENOFIBRATE 160 MG PO TABS
160.0000 mg | ORAL_TABLET | Freq: Every morning | ORAL | Status: DC
  Administered 2024-07-11: 160 mg via ORAL
  Filled 2024-07-10: qty 1

## 2024-07-10 MED ORDER — ASPIRIN 325 MG PO TABS: 650.0000 mg | ORAL_TABLET | Freq: Every day | ORAL | Status: DC

## 2024-07-10 MED ORDER — IOHEXOL 350 MG/ML SOLN
75.0000 mL | Freq: Once | INTRAVENOUS | Status: AC | PRN
  Administered 2024-07-10: 75 mL via INTRAVENOUS

## 2024-07-10 MED ORDER — BISACODYL 5 MG PO TBEC: 5.0000 mg | DELAYED_RELEASE_TABLET | Freq: Every day | ORAL | Status: DC | PRN

## 2024-07-10 MED ORDER — ONDANSETRON HCL 4 MG PO TABS: 4.0000 mg | ORAL_TABLET | Freq: Four times a day (QID) | ORAL | Status: DC | PRN

## 2024-07-10 MED ORDER — HEPARIN SODIUM (PORCINE) 5000 UNIT/ML IJ SOLN
5000.0000 [IU] | Freq: Three times a day (TID) | INTRAMUSCULAR | Status: DC
  Administered 2024-07-10 – 2024-07-11 (×2): 5000 [IU] via SUBCUTANEOUS
  Filled 2024-07-10 (×2): qty 1

## 2024-07-10 MED ORDER — HYDRALAZINE HCL 20 MG/ML IJ SOLN
10.0000 mg | INTRAMUSCULAR | Status: DC | PRN
  Administered 2024-07-10: 10 mg via INTRAVENOUS
  Filled 2024-07-10: qty 1

## 2024-07-10 MED ORDER — ONDANSETRON HCL 4 MG/2ML IJ SOLN: 4.0000 mg | Freq: Four times a day (QID) | INTRAMUSCULAR | Status: DC | PRN

## 2024-07-10 MED ORDER — ASPIRIN 325 MG PO TABS
650.0000 mg | ORAL_TABLET | Freq: Once | ORAL | Status: AC
  Administered 2024-07-10: 650 mg via ORAL
  Filled 2024-07-10: qty 2

## 2024-07-10 MED ORDER — VITAMIN D 25 MCG (1000 UNIT) PO TABS
25.0000 ug | ORAL_TABLET | Freq: Every day | ORAL | Status: DC
  Administered 2024-07-11: 25 ug via ORAL
  Filled 2024-07-10: qty 1

## 2024-07-10 NOTE — Assessment & Plan Note (Signed)
 Continue B12 supplements when tolerating.

## 2024-07-10 NOTE — Code Documentation (Signed)
 Stroke Response Nurse Documentation Code Documentation  Juan [name] is a 89 y.o. male arriving to [facility]  via [facility] on 07/10/2024 with past medical hx of CAD CABG HTN HLD cochlear implant. On clopidogrel  75 mg daily. Code stroke was activated by EMS.   Patient from Facility where he was LKW at 1230 and now complaining of slurred speech. He was in his usual state of health today eating lunch with his daughter at 56. Then he had two witnessed episodes of slurred speech and drooling.   Stroke team at the bedside on patient arrival. Labs drawn and patient cleared for CT by EDP. Patient to CT with team. NIHSS 4, see documentation for details and code stroke times. Patient with disoriented and dysarthria  on exam. The following imaging was completed:  CT Head and CTA. Patient is not a candidate for IV Thrombolytic due to stroke not suspected. Patient is not a candidate for IR due to no LVO on advanced imaging.   Care Plan:    In window monitoring: q35min NIHSS and VS until OOW (1700), then q 2 for 12 hrs, then q 4.  NPO until passes stroke swallow screen.    Bedside handoff with ED RN Damien RN.    Juan [name]  Stroke Response RN

## 2024-07-10 NOTE — ED Notes (Signed)
 CCMD called; Pt. Is on monitor

## 2024-07-10 NOTE — Assessment & Plan Note (Signed)
 LDL > 70 -Obtaining lipid panel -Allergic/intolerance to statins -Continue Zetia 

## 2024-07-10 NOTE — Assessment & Plan Note (Signed)
 History of TIAs, continue to keep LDL less than 70,  Continue Plavix , Zetia

## 2024-07-10 NOTE — ED Provider Notes (Addendum)
 " [place] EMERGENCY DEPARTMENT AT [facility] Provider Note   CSN: [id number] Arrival date & time: 07/10/24  1407     Patient presents with: Code Stroke   Juan [name] is a 89 y.o. male.   89 yo M with a chief complaints of concern for acute onset strokelike symptoms.  Patient was at lunch and suddenly has a glazed over look and was drooling out of 1 side of his mouth.  Continues to have significant issues with speech.  Arrived as a code stroke.  Level 5 caveat acuity of condition        Prior to Admission medications  Medication Sig Start Date End Date Taking? Authorizing Provider  acetaminophen  (TYLENOL ) 500 MG tablet Take 1,000 mg by mouth 2 (two) times daily.    [provider]  allopurinol  (ZYLOPRIM ) 100 MG tablet Take 100 mg by mouth daily.    [provider]  Cholecalciferol 25 MCG/0.03ML LIQD Take 25 mcg by mouth daily.    [provider]  Choline Fenofibrate  (FENOFIBRIC ACID ) 135 MG CPDR Take 1 tablet by mouth in the morning.    [provider]  clopidogrel  (PLAVIX ) 75 MG tablet Take 75 mg by mouth daily.    [provider]  colchicine  0.6 MG tablet Take 0.6 mg by mouth daily as needed (as directed for gout flares).    [provider]  ezetimibe (ZETIA) 10 MG tablet Take 10 mg by mouth daily.    [provider]  finasteride  (PROSCAR ) 5 MG tablet Take 5 mg by mouth every evening.    [provider]  guaiFENesin -dextromethorphan (ROBITUSSIN DM) 100-10 MG/5ML syrup Take 10 mLs by mouth every 6 (six) hours as needed for cough.    [provider]  lisinopril  (ZESTRIL ) 10 MG tablet Take 1 tablet (10 mg total) by mouth daily. 07/22/23   Fargo, Amy E, NP  metFORMIN  (GLUCOPHAGE ) 500 MG tablet Take 1,000 mg by mouth every morning.    [provider]  metFORMIN  (GLUCOPHAGE ) 500 MG tablet Take 750 mg by mouth every evening.    [provider]  miconazole (MICOTIN) 2 % powder Apply  1 Application topically daily at 12 noon.    [provider]  polyethylene glycol powder (GLYCOLAX /MIRALAX ) 17 GM/SCOOP powder Take 17 g by mouth 2 (two) times a week. 03/21/23   Fargo, Amy E, NP  tamsulosin (FLOMAX) 0.4 MG CAPS capsule Take 0.4 mg by mouth at bedtime.    [provider]  zinc oxide 20 % ointment Apply 1 application  topically as needed for irritation. Apply transdermally as needed for skin irritation after each incontinent episode.    [provider]    Allergies: Simvastatin     Review of Systems  Updated Vital Signs BP (!) 151/112 (BP Location: Left Arm)   Pulse (!) 104   Resp 20   Wt 75 kg   SpO2 98%   BMI 24.42 kg/m   Physical Exam Vitals and nursing note reviewed.  Constitutional:      Appearance: He is well-developed.  HENT:     Head: Normocephalic and atraumatic.  Eyes:     Pupils: Pupils are equal, round, and reactive to light.  Neck:     Vascular: No JVD.  Cardiovascular:     Rate and Rhythm: Normal rate and regular rhythm.     Heart sounds: No murmur heard.    No friction rub. No gallop.  Pulmonary:     Effort: No  respiratory distress.     Breath sounds: No wheezing.  Abdominal:     General: There is no distension.     Tenderness: There is no abdominal tenderness. There is no guarding or rebound.  Musculoskeletal:        General: Normal range of motion.     Cervical back: Normal range of motion and neck supple.  Skin:    Coloration: Skin is not pale.     Findings: No rash.  Neurological:     Mental Status: He is alert and oriented to person, place, and time.     Comments: Slurred speech  Psychiatric:        Behavior: Behavior normal.     (all labs ordered are listed, but only abnormal results are displayed) Labs Reviewed  CBC - Abnormal; Notable for the following components:      Result Value   RBC 4.09 (*)    Hemoglobin 12.3 (*)    HCT 37.5 (*)    All other components within normal limits  I-STAT CHEM  8, ED - Abnormal; Notable for the following components:   BUN 36 (*)    Creatinine, Ser 1.40 (*)    Glucose, Bld 171 (*)    Hemoglobin 12.2 (*)    HCT 36.0 (*)    All other components within normal limits  CBG MONITORING, ED - Abnormal; Notable for the following components:   Glucose-Capillary 165 (*)    All other components within normal limits  DIFFERENTIAL  PROTIME-INR  APTT  COMPREHENSIVE METABOLIC PANEL WITH GFR  ETHANOL  URINALYSIS, ROUTINE W REFLEX MICROSCOPIC    EKG: None  Radiology: CT HEAD CODE STROKE WO CONTRAST Result Date: 07/10/2024 EXAM: CT HEAD WITHOUT CONTRAST 07/10/2024 02:23:58 PM TECHNIQUE: CT of the head was performed without the administration of intravenous contrast. Automated exposure control, iterative reconstruction, and/or weight based adjustment of the mA/kV was utilized to reduce the radiation dose to as low as reasonably achievable. COMPARISON: CT head 02/03/2023. CLINICAL HISTORY: Acute neurological deficit, stroke suspected. FINDINGS: BRAIN AND VENTRICLES: Right sided cochlear implant noted with associated streak artifact which slightly limits evaluation particularly along the right temporal and occipital lobes. Mild chronic microvascular ischemic changes. Mild generalized parenchymal volume loss. 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 Atherosclerosis involving the carotid siphons and intracranial vertebral arteries. No acute hemorrhage. No evidence of acute infarct. No hydrocephalus. No extra-axial collection. No mass effect or midline shift. ORBITS: Right lens replacement. SINUSES: No acute abnormality. SOFT TISSUES AND SKULL: Right sided cochlear implant noted. No skull fracture. [place] stroke program early CT (ASPECT) score: Ganglionic (caudate, internal capsule, lentiform nucleus, insula, M1-M3): 7 Supraganglionic (M4-M6): 3 Total: 10 IMPRESSION: 1. No acute intracranial abnormality. 2. ASPECTS is 10. 3. 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 4.  Evaluation is mildly limited by streak artifact from a right cochlear implant. 5. Findings messaged to Dr. Arora via the [facility] messaging system at 2:37 PM on 07/10/24. Electronically signed by: Donnice Mania MD 07/10/2024 02:38 PM EST RP Workstation: [id number]     Procedures   Medications Ordered in the ED   stroke: early stages of recovery book (has no administration in time range)  aspirin  tablet 650 mg (has no administration in time range)    Or  aspirin  suppository 600 mg (has no administration in time range)  iohexol  (OMNIPAQUE ) 350 MG/ML injection 75 mL (75 mLs Intravenous Contrast Given 07/10/24 1425)  Medical Decision Making Amount and/or Complexity of Data Reviewed Labs: ordered. Radiology: ordered.   89 yo M with a chief complaint of slurred speech.  Patient had a episode while eating lunch where he had a glazed over look and was drooling out of 1 side of his mouth.  Is slowly improving over time.  He arrived as a code stroke.  Airway was cleared at the bridge and was taken urgently to CT.  Thought not to be a candidate for TNK.  Seen by neurology Dr. Voncile, recommends admission for TIA versus seizure.  No leukocytosis no significant electrolyte abnormalities  The patients results and plan were reviewed and discussed.   Any x-rays performed were independently reviewed by myself.   Differential diagnosis were considered with the presenting HPI.  Medications   stroke: early stages of recovery book (has no administration in time range)  aspirin  tablet 650 mg (has no administration in time range)    Or  aspirin  suppository 600 mg (has no administration in time range)  iohexol  (OMNIPAQUE ) 350 MG/ML injection 75 mL (75 mLs Intravenous Contrast Given 07/10/24 1425)    Vitals:   07/10/24 1413 07/10/24 1433 07/10/24 1434  BP:   (!) 151/112  Pulse:   (!) 104  Resp:   20  SpO2:  98% 98%  Weight: 75 kg      Final diagnoses:  Slurred  speech    Admission/ observation were discussed with the admitting physician, patient and/or family and they are comfortable with the plan.        Final diagnoses:  Slurred speech    ED Discharge Orders     None         Emil Share, DO 07/10/24 1504  "

## 2024-07-10 NOTE — Assessment & Plan Note (Signed)
 Chronic history of BPH, currently not complaining of any obstruction or retention -Continue Flomax, Proscar 

## 2024-07-10 NOTE — Assessment & Plan Note (Addendum)
 TIA, ruling out acute CVA Slurred speech, drooling, bilateral lower extremity weakness,  At the facility about 1230 this afternoon but progressively been improving. -Code stroke was called, symptoms improving -Neurologist was consulted, noted thrombolytic candidate -Will proceed with speech evaluation-once tolerating p.o., resuming home medication including Plavix , statins for secondary prevention - Neurochecks - Consulting PT/OT/speech - 2D echocardiogram, - Will allow for permissive hypertension (will hold BP meds)   -CTA, CTA head and neck has been reviewed: Remote right basal ganglia infarct, right cochlear implant. Negative any acute stroke findings, chronic intracranial vascular stenosis   - Due to cochlear device unable to obtain MRI of the brain

## 2024-07-10 NOTE — Progress Notes (Signed)
 Routine EEG completed, results pending Neurology review and interpretation

## 2024-07-10 NOTE — Assessment & Plan Note (Signed)
 BUN/creatinine at baseline -Avoid nephrotoxins -Monitor closely Lab Results  Component Value Date   CREATININE 1.28 (H) 07/10/2024   CREATININE 1.40 (H) 07/10/2024   CREATININE 1.4 (A) 01/13/2024

## 2024-07-10 NOTE — Assessment & Plan Note (Signed)
 Continue albuterol, as needed colchicine 

## 2024-07-10 NOTE — ED Notes (Signed)
 Pt. Was taken to the bathroom via wheelchair to get urine sample; Pt. Brief was saturated with urine; Pts. Brief was changed and urine sample was obtained.

## 2024-07-10 NOTE — ED Notes (Signed)
EEG at [facility]

## 2024-07-10 NOTE — ED Triage Notes (Signed)
 Pt. BIB [facility] from a restaurant d/t stroke-like symptoms; Per [facility] the pt. Was eating lunch with daughter when he had a sudden onset of slurred speech and drooling; The daughter said it quickly resolved. The pt. Has a Hx of dementia and dysarthria, but you can understand what he is saying. 1303 was the pt. LKW. Pt. Has a 18G in R FA.   VS per [facility]:  BP: 154/90 CBG: 225 HR: 100 SpO2: 98%

## 2024-07-10 NOTE — Assessment & Plan Note (Signed)
 Continue vitamin supplement

## 2024-07-10 NOTE — Consult Note (Addendum)
 "                              Consultation Note Date: 07/10/2024   Patient Name: Juan [name]  DOB: 1936/04/19  MRN: [id number]  Age / Sex: 89 y.o., male  PCP: Charlanne Fredia CROME, MD Referring Physician: Willette Adriana LABOR, MD  Reason for Consultation: Establishing goals of care  HPI/Patient Profile: 89 y.o. male  with past medical history significant of DM type II, hypertension, hyperlipidemia, CAD, status post CABG, CKD, chronic hearing loss, GERD, gout, prior CVA/TIAs.  Admitted on 07/10/2024 with acute onset of slurred speech, drooling, bilateral lower extremity weakness, at the facility as he was trying to have his lunch with his daughter today.  Patient's symptoms started about 1230 this afternoon but progressively been improving.  Code stroke was called.  CT head done, no acute intracranial abnormalities, remote right basal ganglia infarct, right cochlear implant.  Due to cochlear device, unable to obtain MRI of the brain.  Neurologist was consulted.   PMT has been consulted to assist with goals of care conversation. Patient/Family face treatment option decisions, advanced directive decisions and anticipatory care needs.   Family face treatment option decision, advance directive decisions and anticipatory care needs.   Clinical Assessment and Goals of Care:  I have reviewed medical records including: EPIC notes: Reviewed Dr. Dela H&P note from 07/10/2024 detailing plan of care mainly for stroke workup.  Reviewed [facility] TOC note from 07/10/2024, note that patient is a resident of [facility].  Reviewed to track clinical course and prognostication.  MAR: Reviewed PRN meds received over the last 24 hours.  None.  Reviewed to assess needs for medication adjustment to optimize comfort.  Available advanced directives in ACP: Living will and healthcare power of attorney available. Labs: Laboratory results from 07/10/2024 reviewed.  Urinalysis showing large leukocytes and positive  for nitrate.  Creatinine elevated at 1.28 with a GFR of 55.  Reviewed to assist with prescribing and prognostication  Visited patient in the ED room 26 this afternoon.  Patient is in the middle of a procedure.  EEG ongoing.  Patient was noted to be very anxious and confused.  In several locations, patient was observed to be trying to get out of bed, with frequent redirection from the EEG staff.  Met with patient's daughter [name] to discuss diagnosis, prognosis, goals of care, end-of-life wishes, and disposition options.  During the encounter, the daughter was engaged to review her understanding of the reason for the current hospital admission. The daughter was able to articulate the primary reason for hospitalization.  [name] mentioned that the patient was with her other daughter, Niels, about lunchtime at the nursing home when the patient started manifesting strokelike symptoms such as drooling and slurred speech.  There were none mentioned that patient has been progressively declining functional and cognitive wise over the past few months.  [name] also demonstrated awareness of the patients ongoing medical conditions including cognitive impairment, limited functional mobility (wheelchair-bound), prior TIAs, CKD and acknowledged how these comorbidities may contribute to complex clinical picture.  We reviewed that patient is currently being admitted to rule out stroke/CVA and to possibly treat other reversible medical conditions.  We discussed that the patients condition, best and worst case scenarios, short and long-term prognosis.  I explained that the risk for further decompensation is high, and the health journey ahead is expected to be challenging, with likely setbacks,  re hospitalizations, and limited potential for meaningful recovery. The family acknowledged the ongoing decline and expressed understanding of the disease trajectory.  We discussed the importance of establishing clear goals of care,  ensuring that all interventions and treatments align with the patients beliefs, values, and preferences. The daughter was encouraged to share their wishes and priorities, and we reviewed how our care plan can best support these goals, whether that involves pursuing aggressive interventions or focusing on comfort measures.  We discussed the family's hope for clinical improvement, as well as their concerns regarding continued decline although [name] mentioned that the family has already anticipated this to happen. This perspectives were acknowledged and incorporated into ongoing care planning.  We discussed code status and confirmed that patient is DNR/DNI.   We further explored the pathway for care escalation should the patients condition worsen. The family was given the opportunity to consider whether they would want escalation in the level of care (such as transfer to intensive care or use of non-invasive ventilation), or if they would prefer to shift to a comfort-focused approach, prioritizing symptom management and quality of life. [name] expressed understanding of these options and the importance of aligning future interventions with the patients goals.  I provided education on the philosophy of hospice and palliative care, highlighting the focus on symptom management, comfort, and support for both patient and family. We discussed the different settings in which these services can be provided, including home hospice and residential hospice facilities, and the types of support available in each. [name] mentioned that if this will become a matter to tackle sooner than later, she believes [facility] will be the route to proceed. [name] and family are familiar with hospice philosophy of care due to having hospice experience previously with loved ones.   The current medical plan was reviewed, with a focus on treating reversible conditions and medical optimization. I summarized that based on our  conversation today, Family opted that we will continue to address treatable issues as appropriate, while also allowing time to assess outcomes and adjust the plan as needed, hoping for clinical improvement, always prioritizing the patients comfort and dignity. Confirmed DNR/DNI code status.  [name] mentioned to me that the family is very understanding and reasonable regarding the patient's medical decline or progression of medical decline and is open for patient to be transition to comfort focused care or hospice should the current medical state do not improve or worsen.    Discussed that a severe stroke can cause permanent loss of movement, speech, and awareness. Noted that recovery may be limited, and care may shift to comfort and quality of life.   I introduced Palliative Medicine as specialized medical care for people living with serious illness. It focuses on providing relief from the symptoms and stress of a serious illness. The goal is to improve quality of life for both the patient and the family.  A  discussion has had today regarding advanced directives.  Concepts specific to code status, artifical feeding and hydration, continued IV antibiotics and rehospitalization was had.  The difference between a aggressive medical intervention path  and a palliative comfort care path for this patient at this time was had.  Values and goals of care important to patient and family were attempted to be elicited.  The difference between aggressive medical intervention and comfort care was considered in light of the patient's goals of care. Hospice and Palliative Care services outpatient were explained and offered.   Questions and  concerns addressed. Daughter encouraged to call with questions or concerns.     Discussed the importance of continued conversation with family and the medical providers regarding overall plan of care and treatment options, ensuring decisions are within the context of the patients  values and GOCs.   Questions and concerns were addressed.  Hard Choices booklet left for review. The family was encouraged to call with questions or concerns.  PMT will continue to support holistically.  Social History: [facility]  [facility], but had multiple falls and overall functional mobility declined, so [facility]  He had been married for 61 years.  Widowed, wife passed away in 08-08-2011.  He has 2 daughters and 1 sister who currently lives in [place] .  [name] describes her father as a people person and loves to read.   Functional and Nutritional State: Patient is currently with functional limitation due to acute and chronic illnesses.  Patient has been wheelchair-bound at baseline.  Appetite has been normal.  Palliative Symptoms: Bilateral lower extremity is weakness, slurred speech, drooling.  Advance Directives: A detailed discussion regarding advanced directives was held with the patient and family today, highlighting the benefits of having clear documentation to guide care in accordance with the patients wishes. The patient has previously completed advanced directive documents.  Available on file.  Code Status: DNR/DNI Concepts specific to code status, artificial feeding and hydration, continued IV antibiotics and rehospitalization was held. The difference between aggressive medical intervention and a palliative comfort path for this patient at this time was had.   Primary Decision Maker: Patient's daughters: [name] Christ and Niels Satchel  SUMMARY OF RECOMMENDATIONS   # Complex medical decision making/goals of care Code Status: Confirmed with patient's daughter [name], CODE STATUS is DNR/DNI No further escalation in level of care Healthcare power of attorney confirmed: 2 daughters Lessie and Niels) Daughter mentioned that patient has cochlear implant,  and must be used by patient at all times. Plan is to continue with current medical interventions, treat the treatable, allow time for outcomes. Spiritual care consult-ordered placed 07/10/2024 for spiritual support. Palliative medicine team will continue to follow.   # Symptom management Per Primary team Palliative medicine is available to assist as needed.    # Psychosocial support Continue to provide psycho-social and emotional support to patient and family   # Treatment plan discussed with:  Patient's daughter [name], nursing, Dr. Willette   Code Status/Advance Care Planning: Full code   Palliative Prophylaxis:  Aspiration, Bowel Regimen, Delirium Protocol, Frequent Pain Assessment, Oral Care, and Turn Reposition   Prognosis:  Prognosis is guarded given significant disease burden, and high risk for decompensation  Discharge Planning: To Be Determined      Primary Diagnoses: Present on Admission:  Stroke [facility])  CKD (chronic kidney disease)  Coronary atherosclerosis  Essential hypertension, benign  Mixed hyperlipidemia  TIA (transient ischemic attack)  Type 2 diabetes mellitus with renal complication (HCC)  Vitamin B12 deficiency  Vitamin D  deficiency  Benign prostatic hyperplasia with urinary obstruction    Physical Exam Vitals and nursing note reviewed.  Constitutional:      Appearance: He is ill-appearing.  HENT:     Head: Normocephalic and atraumatic.     Mouth/Throat:     Mouth: Mucous membranes are moist.  Pulmonary:     Effort: Pulmonary effort is normal.  Abdominal:     General:  Abdomen is flat. Bowel sounds are normal.     Palpations: Abdomen is soft.  Musculoskeletal:     Comments: Generalized weakness  Skin:    General: Skin is warm.  Neurological:     Mental Status: He is alert. Mental status is at baseline.     Comments: Confused, disoriented  Psychiatric:     Comments: Agitated, anxious     Vital Signs: BP (!) 143/76   Pulse 97    Resp (!) 22   Wt 75 kg   SpO2 98%   BMI 24.42 kg/m  Pain Scale: 0-10   Pain Score: 0-No pain   SpO2: SpO2: 98 % O2 Device:SpO2: 98 % O2 Flow Rate: .    Palliative Assessment/Data:30%    I personally spent a total of 90 minutes in the care of the patient today including preparing to see the patient, getting/reviewing separately obtained history, performing a medically appropriate exam/evaluation, counseling and educating, placing orders, referring and communicating with other health care professionals, documenting clinical information in the EHR, communicating results, and coordinating care.   Thank you for allowing us  to participate in the care of Lynwood FORBES Marget Kathlyne JULIANNA Tracie Mickey, NP  Palliative Medicine Team Team phone # [phone]  Thank you for allowing the Palliative Medicine Team to assist in the care of this patient. Please utilize secure chat with additional questions, if there is no response within 30 minutes please call the above phone number.  Palliative Medicine Team providers are available by phone from 7am to 7pm daily and can be reached through the team cell phone.  Should this patient require assistance outside of these hours, please call the patient's attending physician.   "

## 2024-07-10 NOTE — Assessment & Plan Note (Signed)
 Holding home medication of metformin  Last A1c 7.1 -Checking CBG q. ACHS, SSI coverage

## 2024-07-10 NOTE — Hospital Course (Addendum)
 PADRAIC MARINOS is a 89 year old male with extensive history of DM II, HTN, HLD, CAD, S/P CABG, CKD, chronic hearing loss, GERD, gout, CVA/TIAs ...  Presented to ED with chief complaint of acute onset of slurred speech, drooling, bilateral lower extremity weakness, at the facility as he was trying to have his lunch with his daughter.  Patient's symptoms started about 1230 this afternoon but progressively been improving.  No recent complaining of upper respiratory symptoms, denies any cough, shortness of breath, headaches visual changes.  Denies of having any fever or chills.  ED Evaluation: Blood pressure (!) 151/112, pulse (!) 104, resp. rate 20, weight 75 kg, SpO2 98%. LABs: BUN 28, creatinine 1.28, calcium 10.6, GFR 55, hemoglobin 12.3,  CT head: No acute intracranial normalities, right basal ganglia infarct, right cochlear implant. CTA head and neck:: Multiple intracranial vascular stenosis -chronic Atherosclerotic changes at MCA territory, severe superior division M2, M4 branch is a stenosis -consistent with 2022 findings..  Severe stenosis of P3 segment of the right PCA, moderate stenosis of the distal A2 segment of left ACA, severe right ventricular artery stenosis,   Code stroke was called, as patient symptoms improving patient was not a candidate for TNK.

## 2024-07-10 NOTE — Assessment & Plan Note (Signed)
 Denies any chest pain Once tolerating p.o. continuing Zetia , Plavix ,

## 2024-07-10 NOTE — Assessment & Plan Note (Signed)
 Chronic hearing loss since age of 77, with a cochlear device implant -Read lips appropriately -Patient is currently awake alert oriented x 3

## 2024-07-10 NOTE — Assessment & Plan Note (Signed)
 Allowing for permissive hypertension -Withholding lisinopril 

## 2024-07-10 NOTE — H&P (Signed)
 " History and Physical   Patient: Juan [name]                            PCP: Charlanne Fredia CROME, MD                    DOB: 11/07/1935            DOA: 07/10/2024 FMW:[id number]             DOS: 07/10/2024, 3:43 PM  Charlanne Fredia CROME, MD  Patient coming from:   HOME  I have personally reviewed patient's medical records, in electronic medical records, including:  [facility], and care everywhere.    Chief Complaint:   Chief Complaint  Patient presents with   Code Stroke    History of present illness:    IVERY [name] is a 89 year old male with extensive history of DM II, HTN, HLD, CAD, S/P CABG, CKD, chronic hearing loss, GERD, gout, CVA/TIAs ...  Presented to ED with chief complaint of acute onset of slurred speech, drooling, bilateral lower extremity weakness, at the facility as he was trying to have his lunch with his daughter.  Patient's symptoms started about 1230 this afternoon but progressively been improving.  No recent complaining of upper respiratory symptoms, denies any cough, shortness of breath, headaches visual changes.  Denies of having any fever or chills.  ED Evaluation: Blood pressure (!) 151/112, pulse (!) 104, resp. rate 20, weight 75 kg, SpO2 98%. LABs: BUN 28, creatinine 1.28, calcium 10.6, GFR 55, hemoglobin 12.3,  CT head: No acute intracranial normalities, right basal ganglia infarct, right cochlear implant. CTA head and neck:: Multiple intracranial vascular stenosis -chronic Atherosclerotic changes at MCA territory, severe superior division M2, M4 branch is a stenosis -consistent with 2022 findings..  Severe stenosis of P3 segment of the right PCA, moderate stenosis of the distal A2 segment of left ACA, severe right ventricular artery stenosis,   Code stroke was called, as patient symptoms improving patient was not a candidate for TNK.     Patient Denies having: Fever, Chills, Cough, SOB, Chest Pain, Abd pain, N/V/D, headache, dizziness, lightheadedness,   Dysuria, Joint pain, rash, open wounds  ED Course:   Blood pressure (!) 143/76, pulse 97, resp. rate (!) 22, weight 75 kg, SpO2 98%. Abnormal labs;   Review of Systems: As per HPI, otherwise 10 point review of systems were negative.   ----------------------------------------------------------------------------------------------------------------------  Allergies[1]  Home MEDs:  Prior to Admission medications  Medication Sig Start Date End Date Taking? Authorizing Provider  acetaminophen  (TYLENOL ) 500 MG tablet Take 1,000 mg by mouth 2 (two) times daily.    [provider]  allopurinol  (ZYLOPRIM ) 100 MG tablet Take 100 mg by mouth daily.    [provider]  Cholecalciferol  25 MCG/0.03ML LIQD Take 25 mcg by mouth daily.    [provider]  Choline Fenofibrate  (FENOFIBRIC ACID ) 135 MG CPDR Take 1 tablet by mouth in the morning.    [provider]  clopidogrel  (PLAVIX ) 75 MG tablet Take 75 mg by mouth daily.    [provider]  colchicine  0.6 MG tablet Take 0.6 mg by mouth daily as needed (as directed for gout flares).    [provider]  ezetimibe  (ZETIA ) 10 MG tablet Take 10 mg by mouth daily.    [provider]  finasteride  (PROSCAR ) 5 MG tablet Take 5 mg by mouth every evening.  [provider]  guaiFENesin -dextromethorphan (ROBITUSSIN DM) 100-10 MG/5ML syrup Take 10 mLs by mouth every 6 (six) hours as needed for cough.    [provider]  lisinopril  (ZESTRIL ) 10 MG tablet Take 1 tablet (10 mg total) by mouth daily. 07/22/23   Fargo, Amy E, NP  metFORMIN  (GLUCOPHAGE ) 500 MG tablet Take 1,000 mg by mouth every morning.    [provider]  metFORMIN  (GLUCOPHAGE ) 500 MG tablet Take 750 mg by mouth every evening.    [provider]  miconazole (MICOTIN) 2 % powder Apply 1 Application topically daily at 12 noon.    [provider]  polyethylene glycol powder (GLYCOLAX /MIRALAX ) 17  GM/SCOOP powder Take 17 g by mouth 2 (two) times a week. 03/21/23   Fargo, Amy E, NP  tamsulosin  (FLOMAX ) 0.4 MG CAPS capsule Take 0.4 mg by mouth at bedtime.    [provider]  zinc oxide 20 % ointment Apply 1 application  topically as needed for irritation. Apply transdermally as needed for skin irritation after each incontinent episode.    [provider]    PRN MEDs: acetaminophen  **OR** acetaminophen , bisacodyl , hydrALAZINE , HYDROmorphone  (DILAUDID ) injection, ipratropium, ondansetron  **OR** ondansetron  (ZOFRAN ) IV, oxyCODONE , senna-docusate, sodium phosphate, traZODone   Past Medical History:  Diagnosis Date   Cancer (HCC)    skin   CKD (chronic kidney disease) 06/24/2013   Cochlear implant in place    Coronary artery disease    a. s/p CABG 2000; b. myoview 9/08: inf-septal scar with mild peri-infarct ischemia (low risk);  c.  Echo 8/13: mild focal basal septal hypertrophy, Gr 1 DD, mild LAE;  d. Lexiscan  Myoview (06/20/2013): No reversible ischemia, inferior and septal infarct, inferior and septal hypokinesis, EF 60%;  e.  Echo (06/20/2011): EF 50-55%, normal wall motion, grade 1 diastolic dysfunction, mild LAE   Diverticular disease    Duodenitis    Elevated PSA    GERD (gastroesophageal reflux disease)    Gout    Humerus fracture    right   Hydronephrosis with ureteropelvic junction obstruction    Hypercalcemia 02/02/2012   a. 01/2012: 10.7.   Hyperlipidemia    Hypertension    Pancreatitis    a. ? Simvastatin  related   Pneumonia age 45 or 7   I think only once (06/19/2013)   Syncope    a. 06/2013   TIA (transient ischemic attack)    a. 01/2012 - presentation as acute imbalance;  b. 01/2012 carotid U/S w/o signif stenosis;  c. 01/2012 Echo: EF 60-65%, Gr 1 DD.;  d.  Carotid US  (06/20/2013): 1-39% bilateral ICA stenosis   Tubular adenoma polyp of rectum    Type II diabetes mellitus (HCC)    diet and exercise controlled at present (06/19/2013)    Past Surgical  History:  Procedure Laterality Date   APPENDECTOMY     CARDIAC CATHETERIZATION  2000   CHOLECYSTECTOMY     COCHLEAR IMPLANT Right 1990's   COLONOSCOPY W/ POLYPECTOMY     no polyp 2012   CORONARY ARTERY BYPASS GRAFT  2000   CABG X5   INGUINAL HERNIA REPAIR Right 09/15/2015   Procedure: OPEN REPAIR RIGHT INGUINAL HERNIA ;  Surgeon: Krystal Russell, MD;  Location: [facility];  Service: General;  Laterality: Right;   INSERTION OF MESH Right 09/15/2015   Procedure: INSERTION OF MESH;  Surgeon: Krystal Russell, MD;  Location: [facility];  Service: General;  Laterality: Right;   PARATHYROIDECTOMY  1978   POLYPECTOMY     REVERSE SHOULDER  ARTHROPLASTY Right 12/30/2014   Procedure: Right shoulder ORIF, Biomet S3 Plate and ZImmer cable system ;  Surgeon: Marcey Her, MD;  Location: [facility];  Service: Orthopedics;  Laterality: Right;   right ankle surgery  age 69   TONSILLECTOMY  1943   trachestomy as child for throat closing       reports that he has quit smoking. His smoking use included cigars. He has never used smokeless tobacco. He reports current alcohol use. He reports that he does not use drugs.   Family History  Problem Relation Age of Onset   Diabetes Mother    Melanoma Father    Heart attack Father         in 96s    Physical Exam:   Vitals:   07/10/24 1415 07/10/24 1433 07/10/24 1434 07/10/24 1445  BP: (!) 152/89  (!) 151/112 (!) 143/76  Pulse: 98  (!) 104 97  Resp:   20 (!) 22  SpO2: 95% 98% 98% 98%  Weight:       Constitutional: NAD, calm, comfortable Eyes: PERRL, lids and conjunctivae normal ENMT: Mucous membranes are moist. Posterior pharynx clear of any exudate or lesions.Normal dentition.  Neck: normal, supple, no masses, no thyromegaly Respiratory: clear to auscultation bilaterally, no wheezing, no crackles. Normal respiratory effort. No accessory muscle use.  Cardiovascular: Regular rate and rhythm, no murmurs / rubs / gallops. No extremity edema. 2+ pedal pulses. No  carotid bruits.  Abdomen: no tenderness, no masses palpated. No hepatosplenomegaly. Bowel sounds positive.  Musculoskeletal: no clubbing / cyanosis. No joint deformity upper and lower extremities. Good ROM, no contractures. Normal muscle tone.  Neurologic: CN II-XII grossly intact. Sensation intact, DTR normal. Strength 5/5 in all 4.  Psychiatric: Normal judgment and insight. Alert and oriented x 3. Normal mood.  Skin: no rashes, lesions, ulcers. No induration Decubitus/ulcers:  Wounds: per nursing documentation         Labs on admission:    I have personally reviewed following labs and imaging studies  CBC: Recent Labs  Lab 07/10/24 1433 07/10/24 1434  WBC  --  6.7  NEUTROABS  --  4.2  HGB 12.2* 12.3*  HCT 36.0* 37.5*  MCV  --  91.7  PLT  --  327   Basic Metabolic Panel: Recent Labs  Lab 07/10/24 1433 07/10/24 1434  NA 141 137  K 4.1 4.1  CL 106 103  CO2  --  22  GLUCOSE 171* 175*  BUN 36* 28*  CREATININE 1.40* 1.28*  CALCIUM  --  10.6*   GFR: Estimated Creatinine Clearance: 41.4 mL/min (A) (by C-G formula based on SCr of 1.28 mg/dL (H)). Liver Function Tests: Recent Labs  Lab 07/10/24 1434  AST 36  ALT 15  ALKPHOS 48  BILITOT 0.3  PROT 6.7  ALBUMIN 3.9   No results for input(s): LIPASE, AMYLASE in the last 168 hours. No results for input(s): AMMONIA in the last 168 hours. Coagulation Profile: Recent Labs  Lab 07/10/24 1434  INR 1.0   Cardiac Enzymes: No results for input(s): CKTOTAL, CKMB, CKMBINDEX, TROPONINI in the last 168 hours. BNP (last 3 results) No results for input(s): PROBNP in the last 8760 hours. HbA1C: No results for input(s): HGBA1C in the last 72 hours. CBG: Recent Labs  Lab 07/10/24 1408  GLUCAP 165*   Lipid Profile: No results for input(s): CHOL, HDL, LDLCALC, TRIG, CHOLHDL, LDLDIRECT in the last 72 hours. Thyroid  Function Tests: No results for input(s): TSH, T4TOTAL, FREET4,  T3FREE,  THYROIDAB in the last 72 hours. Anemia Panel: No results for input(s): VITAMINB12, FOLATE, FERRITIN, TIBC, IRON, RETICCTPCT in the last 72 hours. Urine analysis:    Component Value Date/Time   COLORURINE YELLOW 02/03/2023 2102   APPEARANCEUR CLOUDY (A) 02/03/2023 2102   LABSPEC 1.014 02/03/2023 2102   PHURINE 6.0 02/03/2023 2102   GLUCOSEU 250 (A) 02/03/2023 2102   HGBUR MODERATE (A) 02/03/2023 2102   BILIRUBINUR NEGATIVE 02/03/2023 2102   KETONESUR NEGATIVE 02/03/2023 2102   PROTEINUR 30 (A) 02/03/2023 2102   UROBILINOGEN 0.2 06/20/2013 0744   NITRITE NEGATIVE 02/03/2023 2102   LEUKOCYTESUR LARGE (A) 02/03/2023 2102    Last A1C:  Lab Results  Component Value Date   HGBA1C 8.2 01/13/2024     Radiologic Exams on Admission:   CT ANGIO HEAD NECK W WO CM (CODE STROKE) Result Date: 07/10/2024 EXAM: CTA HEAD AND NECK WITH AND WITHOUT 07/10/2024 02:24:23 PM TECHNIQUE: CTA of the head and neck was performed with and without the administration of intravenous contrast. 75 mL of iohexol  (OMNIPAQUE ) 350 MG/ML injection was administered. Multiplanar 2D and/or 3D reformatted images are provided for review. Automated exposure control, iterative reconstruction, and/or weight based adjustment of the mA/kV was utilized to reduce the radiation dose to as low as reasonably achievable. Stenosis of the internal carotid arteries measured using NASCET criteria. COMPARISON: Same day CT head as well as CTA head and neck 10/21/2020. CLINICAL HISTORY: Neuro deficit, acute, stroke suspected. Acute neurological deficit, stroke suspected. FINDINGS: CTA NECK: AORTIC ARCH AND ARCH VESSELS: Mild atherosclerosis of the aortic arch. Atherosclerosis of the proximal subclavian arteries without significant stenosis. No dissection or arterial injury. CERVICAL CAROTID ARTERIES: Mild atherosclerosis at the right carotid bifurcation without hemodynamically significant stenosis. Mild atherosclerosis at the  left carotid bifurcation without hemodynamically significant stenosis. Partial retropharyngeal course of the proximal left cervical ICA. Tortuosity of the left cervical ICA. No dissection or arterial injury. CERVICAL VERTEBRAL ARTERIES: Atherosclerosis at the right vertebral artery origin results in severe stenosis. Mild tortuosity of the right V1 segment. Atherosclerosis at the left vertebral artery origin resulting in mild stenosis. Tortuosity of the left V1 segment. No dissection or arterial injury. LUNGS AND MEDIASTINUM: Unremarkable. SOFT TISSUES: No acute abnormality. BONES: Sequelae of median sternotomy. Degenerative changes in the visualized spine. CTA HEAD: ANTERIOR CIRCULATION: There is sclerosis in the bilateral carotid siphons. There is mild stenosis of the right supraclinoid ICA. The anterior cerebral arteries are patent bilaterally. Moderate stenosis of the distal A2 segment of the left ACA. The M1 segment of the left MCA is patent. Proximal M2 branches of the left MCA are patent. There is atherosclerotic irregularity of multiple left MCA branches. Focal severe stenosis of an M2 superior division branch of the left MCA (series 7, image 82). Additional severe stenosis of an M2 superior division branch of the left MCA (series 7, image 82). Additional severe stenosis versus short segment occlusion of a distal M2 branch of the left MCA (series 7, image 97 and series 11, image 146). There is severe stenosis versus short segment occlusion of an M4 branch of the left MCA over the inferior aspect of the parietal lobe (series 7, image 88). The right MCA is patent. Evaluation is somewhat limited due to streak artifact. Within these limitations, there are areas of mild atherosclerotic irregularity of right MCA branches without evidence of severe stenosis or occlusion. No aneurysm. POSTERIOR CIRCULATION: Severe stenosis of the P3 segment of the right PCA. Moderate stenosis of the mid P2 segment  of the left PCA. No  significant stenosis of the basilar artery. No significant stenosis of the vertebral arteries. No aneurysm. IMPRESSION: 1. Atherosclerotic irregularity of multiple left MCA branches. Focal severe stenosis of an M2 superior division branch. Additional severe stenosis versus short segment occlusion of a distal M2 branch. Severe stenosis versus short segment occlusion of an M4 branch over the inferior left parietal lobe. Findings are new since the 2022 CTA. 2. Severe stenosis of the P3 segment of the right PCA and moderate stenosis of the mid P2 segment of the left PCA, increased from prior. 3. Moderate stenosis of the distal A2 segment of the left ACA, similar to prior. 4. Severe stenosis at the right vertebral artery origin, similar to prior. 5. Impression 1 messaged to Dr. Voncile via the [facility] messaging system at 2:37 PM on 07/10/24. Electronically signed by: Donnice Mania MD 07/10/2024 02:53 PM EST RP Workstation: [id number]   CT HEAD CODE STROKE WO CONTRAST Result Date: 07/10/2024 EXAM: CT HEAD WITHOUT CONTRAST 07/10/2024 02:23:58 PM TECHNIQUE: CT of the head was performed without the administration of intravenous contrast. Automated exposure control, iterative reconstruction, and/or weight based adjustment of the mA/kV was utilized to reduce the radiation dose to as low as reasonably achievable. COMPARISON: CT head 02/03/2023. CLINICAL HISTORY: Acute neurological deficit, stroke suspected. FINDINGS: BRAIN AND VENTRICLES: Right sided cochlear implant noted with associated streak artifact which slightly limits evaluation particularly along the right temporal and occipital lobes. Mild chronic microvascular ischemic changes. Mild generalized parenchymal volume loss. 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 Atherosclerosis involving the carotid siphons and intracranial vertebral arteries. No acute hemorrhage. No evidence of acute infarct. No hydrocephalus. No extra-axial collection. No mass effect or midline shift.  ORBITS: Right lens replacement. SINUSES: No acute abnormality. SOFT TISSUES AND SKULL: Right sided cochlear implant noted. No skull fracture. [place] stroke program early CT (ASPECT) score: Ganglionic (caudate, internal capsule, lentiform nucleus, insula, M1-M3): 7 Supraganglionic (M4-M6): 3 Total: 10 IMPRESSION: 1. No acute intracranial abnormality. 2. ASPECTS is 10. 3. 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 4. Evaluation is mildly limited by streak artifact from a right cochlear implant. 5. Findings messaged to Dr. Arora via the [facility] messaging system at 2:37 PM on 07/10/24. Electronically signed by: Donnice Mania MD 07/10/2024 02:38 PM EST RP Workstation: [id number]    EKG:   Independently reviewed.  Orders placed or performed during the hospital encounter of 07/10/24   EKG 12-Lead   ---------------------------------------------------------------------------------------------------------------------------------------    Assessment / Plan:   Principal Problem:   Stroke [facility]) Active Problems:   CKD (chronic kidney disease)   Mixed hyperlipidemia   History of gout   Essential hypertension, benign   Coronary atherosclerosis   TIA (transient ischemic attack)   Type 2 diabetes mellitus with renal complication (HCC)   HOH (hard of hearing)   Vitamin D  deficiency   Vitamin B12 deficiency   Benign prostatic hyperplasia with urinary obstruction   Assessment and Plan: * Stroke (HCC) TIA, ruling out acute CVA Slurred speech, drooling, bilateral lower extremity weakness,  At the facility about 1230 this afternoon but progressively been improving. -Code stroke was called, symptoms improving -Neurologist was consulted, noted thrombolytic candidate -Will proceed with speech evaluation-once tolerating p.o., resuming home medication including Plavix , statins for secondary prevention - Neurochecks - Consulting PT/OT/speech - 2D echocardiogram, - Will allow for permissive hypertension  (will hold BP meds)   -CTA, CTA head and neck has been reviewed: Remote right basal ganglia infarct, right cochlear implant.  Negative any acute stroke findings, chronic intracranial vascular stenosis   - Due to cochlear device unable to obtain MRI of the brain   CKD (chronic kidney disease) BUN/creatinine at baseline -Avoid nephrotoxins -Monitor closely Lab Results  Component Value Date   CREATININE 1.28 (H) 07/10/2024   CREATININE 1.40 (H) 07/10/2024   CREATININE 1.4 (A) 01/13/2024    Vitamin B12 deficiency Continue B12 supplements when tolerating.  Vitamin D  deficiency Continue vitamin supplement  HOH (hard of hearing) Chronic hearing loss since age of 73, with a cochlear device implant -Read lips appropriately -Patient is currently awake alert oriented x 3  Type 2 diabetes mellitus with renal complication (HCC) Holding home medication of metformin  Last A1c 7.1 -Checking CBG q. ACHS, SSI coverage  TIA (transient ischemic attack) History of TIAs, continue to keep LDL less than 70,  Continue Plavix , Zetia   Coronary atherosclerosis Denies any chest pain Once tolerating p.o. continuing Zetia , Plavix ,   Essential hypertension, benign Allowing for permissive hypertension -Withholding lisinopril   History of gout Continue albuterol, as needed colchicine   Mixed hyperlipidemia LDL > 70 -Obtaining lipid panel -Allergic/intolerance to statins -Continue Zetia   Benign prostatic hyperplasia with urinary obstruction Chronic history of BPH, currently not complaining of any obstruction or retention -Continue Flomax , Proscar        Consults called: Neurologist -------------------------------------------------------------------------------------------------------------------------------------------- DVT prophylaxis:  heparin  injection 5,000 Units Start: 07/10/24 1530 SCDs Start: 07/10/24 1515   Code Status: Confirmed DNR/DNI status Confirmed with daughter at the  bedside   Admission status: Patient will be admitted as Observation, with a greater than 2 midnight length of stay. Level of care: Telemetry   Family Communication: Discussed with daughter  (The above findings and plan of care has been discussed with patient in detail, the patient expressed understanding and agreement of above plan)  --------------------------------------------------------------------------------------------------------------------------------------------------  Disposition Plan:  Anticipated 1-2 days Status is: Observation The patient remains OBS appropriate and will d/c before 2 midnights.     ----------------------------------------------------------------------------------------------------------------------------------------------------  Time spent:  76  Min.  Was spent seeing and evaluating the patient, reviewing all medical records, drawn plan of care.  SIGNED: Adriana DELENA Grams, MD, FHM. FAAFP. [place] [facility], Pager  (Please use [email or web address] to page/ or secure chat through epic) If 7PM-7AM, please contact night-coverage [email or web address],  07/10/2024, 3:43 PM     [1]  Allergies Allergen Reactions   Simvastatin  Other (See Comments)    Possible statin-induced pancreatitis   "

## 2024-07-10 NOTE — TOC CM/SW Note (Addendum)
 TOC consult received for d/c planning needs, as per notes, patient resides at [facility]. Message sent to SNF to determine if patient is LTC or resides in their ALF. Awaiting response. Follow-up to be completed with patient as appropriate.   Merilee Batty, MSN, RN Case Management [phone]    [phone]: Received call from staff at [facility]. They are a LTC private pay facility. Nursing staff can be reached at [phone] ext [phone]. Patient should be able to return unless he requires a STR stay at an alternate facility.

## 2024-07-10 NOTE — Consult Note (Signed)
 NEUROLOGY CONSULT NOTE   Date of service: July 10, 2024 Patient Name: Juan [name] MRN:  [id number] DOB:  1936/02/05 Chief Complaint: Slurred speech, left-sided weakness Requesting Provider: No att. providers found  History of Present Illness  Juan [name] is a 89 y.o. male with hx of CAD s/p CABG, HTN, HLD, HTN, prior stroke, hearing loss s/p cochlear impilant presenting with an acute onset of slurred speech, drooling, and bilateral leg weakness.  He is currently on Plavix  75 mg daily. He was eatting lunch with his daughter and after they finished eatting he developed slurred speech and drooling. It resolved fully and then reoccured.  He has not fully returned to baseline.  Family states speech is still thick.  He is only able to name one of his 2 daughters which is abnormal for him as well.  At baseline he is fully wheelchair-bound and does need assistance with ADLs.  He currently resides at [facility]. Daughter has a video that shows almost like a speech arrest with drooling and confusion.  LKW: 1230 Modified rankin score:4 IV Thrombolysis: Unclear if this is a stroke versus stroke mimic.  Discussed with family the risks benefits and alternatives and they agreed not to proceed EVT: Poor baseline functional status  NIHSS components Score: Comment  1a Level of Conscious 0[]  1[]  2[]  3[]      1b LOC Questions 0[]  1[]  2[x]       1c LOC Commands 0[]  1[]  2[]       2 Best Gaze 0[]  1[]  2[]       3 Visual 0[]  1[]  2[]  3[]      4 Facial Palsy 0[]  1[]  2[]  3[]      5a Motor Arm - left 0[]  1[]  2[]  3[]  4[]  UN[]    5b Motor Arm - Right 0[]  1[]  2[]  3[]  4[]  UN[]    6a Motor Leg - Left 0[]  1[]  2[]  3[]  4[]  UN[]    6b Motor Leg - Right 0[]  1[]  2[]  3[]  4[]  UN[]    7 Limb Ataxia 0[]  1[]  2[]  UN[]      8 Sensory 0[]  1[]  2[]  UN[]      9 Best Language 0[]  1[]  2[]  3[]      10 Dysarthria 0[]  1[]  2[x]  UN[]      11 Extinct. and Inattention 0[]  1[]  2[]       TOTAL:4       ROS  Comprehensive ROS performed and  pertinent positives documented in HPI   Past History   Past Medical History:  Diagnosis Date   Cancer (HCC)    skin   CKD (chronic kidney disease) 06/24/2013   Cochlear implant in place    Coronary artery disease    a. s/p CABG 2000; b. myoview 9/08: inf-septal scar with mild peri-infarct ischemia (low risk);  c.  Echo 8/13: mild focal basal septal hypertrophy, Gr 1 DD, mild LAE;  d. Lexiscan  Myoview (06/20/2013): No reversible ischemia, inferior and septal infarct, inferior and septal hypokinesis, EF 60%;  e.  Echo (06/20/2011): EF 50-55%, normal wall motion, grade 1 diastolic dysfunction, mild LAE   Diverticular disease    Duodenitis    Elevated PSA    GERD (gastroesophageal reflux disease)    Gout    Humerus fracture    right   Hydronephrosis with ureteropelvic junction obstruction    Hypercalcemia 02/02/2012   a. 01/2012: 10.7.   Hyperlipidemia    Hypertension    Pancreatitis    a. ? Simvastatin  related   Pneumonia age 81 or 7   I think only  once (06/19/2013)   Syncope    a. 06/2013   TIA (transient ischemic attack)    a. 01/2012 - presentation as acute imbalance;  b. 01/2012 carotid U/S w/o signif stenosis;  c. 01/2012 Echo: EF 60-65%, Gr 1 DD.;  d.  Carotid US  (06/20/2013): 1-39% bilateral ICA stenosis   Tubular adenoma polyp of rectum    Type II diabetes mellitus (HCC)    diet and exercise controlled at present (06/19/2013)    Past Surgical History:  Procedure Laterality Date   APPENDECTOMY     CARDIAC CATHETERIZATION  2000   CHOLECYSTECTOMY     COCHLEAR IMPLANT Right 1990's   COLONOSCOPY W/ POLYPECTOMY     no polyp 2012   CORONARY ARTERY BYPASS GRAFT  2000   CABG X5   INGUINAL HERNIA REPAIR Right 09/15/2015   Procedure: OPEN REPAIR RIGHT INGUINAL HERNIA ;  Surgeon: Krystal Russell, MD;  Location: [facility];  Service: General;  Laterality: Right;   INSERTION OF MESH Right 09/15/2015   Procedure: INSERTION OF MESH;  Surgeon: Krystal Russell, MD;  Location: [facility];  Service:  General;  Laterality: Right;   PARATHYROIDECTOMY  1978   POLYPECTOMY     REVERSE SHOULDER ARTHROPLASTY Right 12/30/2014   Procedure: Right shoulder ORIF, Biomet S3 Plate and ZImmer cable system ;  Surgeon: Marcey Her, MD;  Location: [facility] OR;  Service: Orthopedics;  Laterality: Right;   right ankle surgery  age 51   TONSILLECTOMY  26   trachestomy as child for throat closing      Family History: Family History  Problem Relation Age of Onset   Diabetes Mother    Melanoma Father    Heart attack Father         in 60s    Social History  reports that he has quit smoking. His smoking use included cigars. He has never used smokeless tobacco. He reports current alcohol use. He reports that he does not use drugs.  Allergies[1]  Medications  Current Medications[2]  Vitals   There were no vitals filed for this visit.  There is no height or weight on file to calculate BMI.   Physical Exam   Constitutional: Appears well-developed and well-nourished.  Psych: Affect appropriate to situation.  Eyes: No scleral injection.  HENT: No OP obstruction.  Head: Normocephalic.  Cardiovascular: Normal rate and regular rhythm.  Respiratory: Effort normal, non-labored breathing.  GI: Soft.  No distension. There is no tenderness.  Skin: WDI.   Neurologic Examination    Neuro: Mental Status: Patient is awake, alert, oriented to person and place, disoriented to time (baseline) Patient is able to give a clear and coherent history. No signs of aphasia or neglect Cranial Nerves: II: Visual Fields are full. Pupils are equal, round, and reactive to light.   III,IV, VI: EOMI without ptosis or diploplia.  V: Facial sensation is symmetric to temperature VII: Facial movement is symmetric resting and smiling VIII: Hearing is intact to voice X: Palate elevates symmetrically XI: Shoulder shrug is symmetric. XII: Tongue protrudes midline without atrophy or fasciculations.  Motor: Tone is normal.  Bulk is normal. 5/5 strength was present in all four extremities.  Sensory: Sensation is symmetric to light touch and temperature in the arms and legs. No extinction to DSS present.  Cerebellar: FNF and HKS are intact bilaterally   Labs/Imaging/Neurodiagnostic studies   CBC: No results for input(s): WBC, NEUTROABS, HGB, HCT, MCV, PLT in the last 168 hours. Basic Metabolic Panel:  Lab Results  Component Value Date   NA 141 01/13/2024   K 4.2 01/13/2024   CO2 24 (A) 01/13/2024   GLUCOSE 195 (H) 12/24/2020   BUN 24 (A) 01/13/2024   CREATININE 1.4 (A) 01/13/2024   CALCIUM 10.5 01/23/2024   GFRNONAA 54 (L) 12/24/2020   GFRAA 55 (L) 01/19/2020   Lipid Panel:  Lab Results  Component Value Date   LDLCALC 100 07/26/2023   HgbA1c:  Lab Results  Component Value Date   HGBA1C 8.2 01/13/2024   Urine Drug Screen:     Component Value Date/Time   LABOPIA NONE DETECTED 02/02/2012 1608   COCAINSCRNUR NONE DETECTED 02/02/2012 1608   LABBENZ NONE DETECTED 02/02/2012 1608   AMPHETMU NONE DETECTED 02/02/2012 1608   THCU NONE DETECTED 02/02/2012 1608   LABBARB NONE DETECTED 02/02/2012 1608    Alcohol Level     Component Value Date/Time   ETH <11 02/02/2012 1458   INR  Lab Results  Component Value Date   INR 1.15 09/14/2015   APTT  Lab Results  Component Value Date   APTT 25 02/02/2012   AED levels: No results found for: PHENYTOIN, ZONISAMIDE, LAMOTRIGINE, LEVETIRACETA  CT Head without contrast(Personally reviewed): No acute intracranial abnormality. ASPECTS is 10. 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 Evaluation is mildly limited by streak artifact from a right cochlear implant.  CT angio Head and Neck with contrast(Personally reviewed): IMPRESSION: 1. Atherosclerotic irregularity of multiple left MCA branches. Focal severe stenosis of an M2 superior division branch. Additional severe stenosis versus short segment occlusion of a distal M2  branch. Severe stenosis versus short segment occlusion of an M4 branch over the inferior left parietal lobe. Findings are new since the 2022 CTA. 2. Severe stenosis of the P3 segment of the right PCA and moderate stenosis of the mid P2 segment of the left PCA, increased from prior. 3. Moderate stenosis of the distal A2 segment of the left ACA, similar to prior. 4. Severe stenosis at the right vertebral artery origin, similar to prior.  ASSESSMENT   BADER STUBBLEFIELD is a 89 y.o. male with a past medical history of CAD s/p CABG, HTN, HLD, HTN, hearing loss s/p cochlear impilant presenting with an acute onset of slurred speech, drooling, and bilateral leg weakness.  We will load with aspirin  650 mg and start aspirin  81 daily in addition to his home dose of Plavix .  Plan for a repeat head CT in 24 hours.  Follow UA and EEG.  Stroke team to follow.    He cannot have an MRI due to his cochlear implant.  RECOMMENDATIONS  - UA - EEG - HgbA1c, fasting lipid panel - Repeat CT head in 24 hours- Frequent neuro checks - Echocardiogram - Carotid dopplers - Load with aspirin  650 mg followed by ASA 81mg  daily, duration to be determined by stroke team - Bedside swallow screen prior to resuming home medications including Plavix  - Risk factor modification - Telemetry monitoring - PT consult, OT consult, Speech consult - Stroke team to follow  ______________________________________________________________________    Signed, Jorene Last, NP [facility] \  Attending Neurohospitalist Addendum Patient seen and examined with APP/Resident. Agree with the history and physical as documented above. Agree with the plan as documented, which I helped formulate. I have independently reviewed the chart, obtained history, review of systems and examined the patient.I have personally reviewed pertinent head/neck/spine imaging (CT/MRI).  Stroke/TIA more likely due to severe ICAD. Sz in  differential Too distal of a lesion and poor  baseline precludes EVT. TNK risky from a bleed perspective, d/w family who agreed to no TNK. Admit for stroke w/u. Also get EEG.   Please feel free to call with any questions.  -- Eligio Lav, MD Neurologist [facility] Pager: [phone]      [1]  Allergies Allergen Reactions   Simvastatin  Other (See Comments)    Possible statin-induced pancreatitis  [2] No current facility-administered medications for this encounter.  Current Outpatient Medications:    acetaminophen  (TYLENOL ) 500 MG tablet, Take 1,000 mg by mouth 2 (two) times daily., Disp: , Rfl:    allopurinol  (ZYLOPRIM ) 100 MG tablet, Take 100 mg by mouth daily., Disp: , Rfl:    Cholecalciferol 25 MCG/0.03ML LIQD, Take 25 mcg by mouth daily., Disp: , Rfl:    Choline Fenofibrate  (FENOFIBRIC ACID ) 135 MG CPDR, Take 1 tablet by mouth in the morning., Disp: , Rfl:    clopidogrel  (PLAVIX ) 75 MG tablet, Take 75 mg by mouth daily., Disp: , Rfl:    colchicine  0.6 MG tablet, Take 0.6 mg by mouth daily as needed (as directed for gout flares)., Disp: , Rfl:    ezetimibe (ZETIA) 10 MG tablet, Take 10 mg by mouth daily., Disp: , Rfl:    finasteride  (PROSCAR ) 5 MG tablet, Take 5 mg by mouth every evening., Disp: , Rfl:    guaiFENesin -dextromethorphan (ROBITUSSIN DM) 100-10 MG/5ML syrup, Take 10 mLs by mouth every 6 (six) hours as needed for cough., Disp: , Rfl:    lisinopril  (ZESTRIL ) 10 MG tablet, Take 1 tablet (10 mg total) by mouth daily., Disp: , Rfl:    metFORMIN  (GLUCOPHAGE ) 500 MG tablet, Take 1,000 mg by mouth every morning., Disp: , Rfl:    metFORMIN  (GLUCOPHAGE ) 500 MG tablet, Take 750 mg by mouth every evening., Disp: , Rfl:    miconazole (MICOTIN) 2 % powder, Apply 1 Application topically daily at 12 noon., Disp: , Rfl:    polyethylene glycol powder (GLYCOLAX /MIRALAX ) 17 GM/SCOOP powder, Take 17 g by mouth 2 (two) times a week., Disp: , Rfl:    tamsulosin (FLOMAX)  0.4 MG CAPS capsule, Take 0.4 mg by mouth at bedtime., Disp: , Rfl:    zinc oxide 20 % ointment, Apply 1 application  topically as needed for irritation. Apply transdermally as needed for skin irritation after each incontinent episode., Disp: , Rfl:

## 2024-07-11 ENCOUNTER — Other Ambulatory Visit (HOSPITAL_COMMUNITY): Payer: Self-pay

## 2024-07-11 ENCOUNTER — Observation Stay (HOSPITAL_COMMUNITY)

## 2024-07-11 DIAGNOSIS — E119 Type 2 diabetes mellitus without complications: Secondary | ICD-10-CM | POA: Diagnosis not present

## 2024-07-11 DIAGNOSIS — N189 Chronic kidney disease, unspecified: Secondary | ICD-10-CM

## 2024-07-11 DIAGNOSIS — I639 Cerebral infarction, unspecified: Secondary | ICD-10-CM | POA: Diagnosis not present

## 2024-07-11 DIAGNOSIS — I779 Disorder of arteries and arterioles, unspecified: Secondary | ICD-10-CM

## 2024-07-11 DIAGNOSIS — I119 Hypertensive heart disease without heart failure: Secondary | ICD-10-CM | POA: Diagnosis not present

## 2024-07-11 DIAGNOSIS — I130 Hypertensive heart and chronic kidney disease with heart failure and stage 1 through stage 4 chronic kidney disease, or unspecified chronic kidney disease: Secondary | ICD-10-CM | POA: Diagnosis not present

## 2024-07-11 DIAGNOSIS — I69391 Dysphagia following cerebral infarction: Secondary | ICD-10-CM

## 2024-07-11 DIAGNOSIS — R4781 Slurred speech: Secondary | ICD-10-CM | POA: Diagnosis not present

## 2024-07-11 DIAGNOSIS — I509 Heart failure, unspecified: Secondary | ICD-10-CM | POA: Diagnosis not present

## 2024-07-11 DIAGNOSIS — E785 Hyperlipidemia, unspecified: Secondary | ICD-10-CM | POA: Diagnosis not present

## 2024-07-11 DIAGNOSIS — G459 Transient cerebral ischemic attack, unspecified: Secondary | ICD-10-CM | POA: Diagnosis not present

## 2024-07-11 DIAGNOSIS — R569 Unspecified convulsions: Secondary | ICD-10-CM | POA: Diagnosis not present

## 2024-07-11 DIAGNOSIS — Z7984 Long term (current) use of oral hypoglycemic drugs: Secondary | ICD-10-CM | POA: Diagnosis not present

## 2024-07-11 DIAGNOSIS — Z9621 Cochlear implant status: Secondary | ICD-10-CM | POA: Diagnosis not present

## 2024-07-11 LAB — ECHOCARDIOGRAM COMPLETE
AR max vel: 1.69 cm2
AV Area VTI: 1.89 cm2
AV Area mean vel: 1.86 cm2
AV Mean grad: 3 mmHg
AV Peak grad: 6.8 mmHg
Ao pk vel: 1.3 m/s
Area-P 1/2: 3.21 cm2
Calc EF: 60.8 %
S' Lateral: 3.2 cm
Single Plane A2C EF: 57.9 %
Single Plane A4C EF: 59.8 %
Weight: 2645.52 [oz_av]

## 2024-07-11 LAB — GLUCOSE, CAPILLARY: Glucose-Capillary: 103 mg/dL — ABNORMAL HIGH (ref 70–99)

## 2024-07-11 LAB — CBC
HCT: 36.3 % — ABNORMAL LOW (ref 39.0–52.0)
Hemoglobin: 12.1 g/dL — ABNORMAL LOW (ref 13.0–17.0)
MCH: 29.7 pg (ref 26.0–34.0)
MCHC: 33.3 g/dL (ref 30.0–36.0)
MCV: 89.2 fL (ref 80.0–100.0)
Platelets: 335 10*3/uL (ref 150–400)
RBC: 4.07 MIL/uL — ABNORMAL LOW (ref 4.22–5.81)
RDW: 13.9 % (ref 11.5–15.5)
WBC: 6.2 10*3/uL (ref 4.0–10.5)
nRBC: 0 % (ref 0.0–0.2)

## 2024-07-11 LAB — BASIC METABOLIC PANEL WITH GFR
Anion gap: 11 (ref 5–15)
BUN: 21 mg/dL (ref 8–23)
CO2: 24 mmol/L (ref 22–32)
Calcium: 9.9 mg/dL (ref 8.9–10.3)
Chloride: 107 mmol/L (ref 98–111)
Creatinine, Ser: 1.12 mg/dL (ref 0.61–1.24)
GFR, Estimated: >60 mL/min
Glucose, Bld: 102 mg/dL — ABNORMAL HIGH (ref 70–99)
Potassium: 3.5 mmol/L (ref 3.5–5.1)
Sodium: 142 mmol/L (ref 135–145)

## 2024-07-11 LAB — PROTIME-INR
INR: 1.1 (ref 0.8–1.2)
Prothrombin Time: 14.4 s (ref 11.4–15.2)

## 2024-07-11 LAB — LIPID PANEL
Cholesterol: 155 mg/dL (ref 0–200)
HDL: 29 mg/dL — ABNORMAL LOW
LDL Cholesterol: 85 mg/dL (ref 0–99)
Total CHOL/HDL Ratio: 5.4 ratio
Triglycerides: 206 mg/dL — ABNORMAL HIGH
VLDL: 41 mg/dL — ABNORMAL HIGH (ref 0–40)

## 2024-07-11 LAB — APTT: aPTT: 32 s (ref 24–36)

## 2024-07-11 LAB — HEMOGLOBIN A1C
Hgb A1c MFr Bld: 6.7 % — ABNORMAL HIGH (ref 4.8–5.6)
Mean Plasma Glucose: 145.59 mg/dL

## 2024-07-11 MED ORDER — CEPHALEXIN 500 MG PO CAPS
500.0000 mg | ORAL_CAPSULE | Freq: Three times a day (TID) | ORAL | 0 refills | Status: AC
  Filled 2024-07-11: qty 6, 2d supply, fill #0

## 2024-07-11 MED ORDER — ASPIRIN 81 MG PO CHEW
81.0000 mg | CHEWABLE_TABLET | Freq: Every day | ORAL | 0 refills | Status: AC
  Filled 2024-07-11: qty 30, 30d supply, fill #0

## 2024-07-11 MED ORDER — TICAGRELOR 90 MG PO TABS: 90.0000 mg | ORAL_TABLET | Freq: Two times a day (BID) | ORAL | Status: DC

## 2024-07-11 MED ORDER — TICAGRELOR 90 MG PO TABS
90.0000 mg | ORAL_TABLET | Freq: Two times a day (BID) | ORAL | 2 refills | Status: AC
  Filled 2024-07-11: qty 60, 30d supply, fill #0

## 2024-07-11 MED ORDER — SODIUM CHLORIDE 0.9 % IV SOLN
1.0000 g | INTRAVENOUS | Status: DC
  Administered 2024-07-11: 1 g via INTRAVENOUS
  Filled 2024-07-11: qty 10

## 2024-07-11 MED ORDER — ASPIRIN 81 MG PO CHEW
81.0000 mg | CHEWABLE_TABLET | Freq: Every day | ORAL | Status: DC
  Administered 2024-07-11: 81 mg via ORAL
  Filled 2024-07-11: qty 1

## 2024-07-11 MED ORDER — MAGNESIUM SULFATE 2 GM/50ML IV SOLN
2.0000 g | Freq: Once | INTRAVENOUS | Status: AC
  Administered 2024-07-11: 2 g via INTRAVENOUS
  Filled 2024-07-11: qty 50

## 2024-07-11 NOTE — Evaluation (Signed)
 Occupational Therapy Evaluation Patient Details Name: Juan [name] MRN: [id number] DOB: February 09, 1936 Today's Date: 07/11/2024   History of Present Illness   Pt is an 89 y.o. male presenting 1/23 with acute onset slurred speech and BLE weakness. CTH negative. PMH: CAD s/p CABG, HTN, HLD, CVA, hearing loss s/p cochlear implant.     Clinical Impressions PTA, pt living at [facility] SNF. Per family, pt was receiving assist with transfers to wheelchair but could propel himself with BLE once there as well as receiving assist with BADL. Since Christmas, needing assist with cutting up his food at mealtime. Upon eval, pt lip reading as cochlear implant not donned, needing increased time and additional cues to follow commands. Currently, pt requires up to total A for LB ADL and  CGA-set-up for UB ADL/grooming. Pt pleasant throughout. Suspect recent decline in strength, with pt needing Mod A +2 safety for mobility today. Patient will benefit from continued inpatient follow up therapy, <3 hours/day with family agreeable to this plan.      If plan is discharge home, recommend the following:   Two people to help with walking and/or transfers;A lot of help with walking and/or transfers;A lot of help with bathing/dressing/bathroom;Assistance with cooking/housework;Assist for transportation;Help with stairs or ramp for entrance     Functional Status Assessment   Patient has had a recent decline in their functional status and demonstrates the ability to make significant improvements in function in a reasonable and predictable amount of time.     Equipment Recommendations   Other (comment) (defer next venue)     Recommendations for Other Services         Precautions/Restrictions   Precautions Precautions: Fall Recall of Precautions/Restrictions: Impaired Restrictions Weight Bearing Restrictions Per Provider Order: No     Mobility Bed Mobility               General bed  mobility comments: OOB in chair on arrival and departure    Transfers Overall transfer level: Needs assistance Equipment used: Rolling walker (2 wheels) Transfers: Sit to/from Stand Sit to Stand: Mod assist, +2 safety/equipment           General transfer comment: cues for safety/hand placement. Assist for rise, full upright position, and steady. Pt reliant on BUE to fully extend knees standing      Balance Overall balance assessment: Needs assistance Sitting-balance support: No upper extremity supported, Feet supported Sitting balance-Leahy Scale: Fair Sitting balance - Comments: Supervision for short periods   Standing balance support: Bilateral upper extremity supported, During functional activity, Reliant on assistive device for balance Standing balance-Leahy Scale: Poor Standing balance comment: device and external support statically and dynamically                           ADL either performed or assessed with clinical judgement   ADL Overall ADL's : Needs assistance/impaired Eating/Feeding: Set up;Sitting   Grooming: Set up;Sitting Grooming Details (indicate cue type and reason): with back support from chair Upper Body Bathing: Set up;Sitting   Lower Body Bathing: Sit to/from stand;Maximal assistance   Upper Body Dressing : Set up;Sitting   Lower Body Dressing: Sit to/from stand;Total assistance   Toilet Transfer: Moderate assistance;+2 for safety/equipment;Stand-pivot;Rolling walker (2 wheels)           Functional mobility during ADLs: Moderate assistance;+2 for safety/equipment;Rolling walker (2 wheels) (+2 for rise, then chair follow)       Vision Baseline Vision/History: 1  Wears glasses Patient Visual Report: No change from baseline Vision Assessment?: No apparent visual deficits Additional Comments: not formally assessed, but pt denies changes, makes good eye contact, reading lips throughout session     Perception Perception: Not  tested       Praxis Praxis: Not tested       Pertinent Vitals/Pain Pain Assessment Pain Assessment: No/denies pain     Extremity/Trunk Assessment Upper Extremity Assessment Upper Extremity Assessment: Generalized weakness;Right hand dominant;LUE deficits/detail LUE Deficits / Details: 4/5 grip as compared to 5/5 R. otherwise BUE WFL bilaterally   Lower Extremity Assessment Lower Extremity Assessment: Defer to PT evaluation       Communication Communication Communication: Impaired Factors Affecting Communication: Hearing impaired (has cochlear implant but often not wearing; reads lips)   Cognition Arousal: Alert Behavior During Therapy: WFL for tasks assessed/performed Cognition: History of cognitive impairments             OT - Cognition Comments: memory deficit at baseline, oriented to birth date (year not reported), place, his family. needing assist for situation. follows one step commands with increased time. reading lips throughout as cochlear implant not donned. can be impulsive/quick to move                 Following commands: Impaired Following commands impaired: Follows one step commands with increased time, Follows one step commands inconsistently (predominantly 2/2 hearing deficits with cochlear implant not donned for session)     Cueing  General Comments   Cueing Techniques: Verbal cues;Gestural cues;Tactile cues  son in law and oldest daughter present and helped with history taking   Exercises     Shoulder Instructions      Home Living Family/patient expects to be discharged to:: Skilled nursing facility                                 Additional Comments: from [facility]F as a resident      Prior Functioning/Environment Prior Level of Function : Needs assist             Mobility Comments: assist for bed mobility, SPT to wheelchair; able to prope wheelchair with BLE ADLs Comments: assist for LB ADL,  changing brief. able to feed and groom self per family report. until christmas they noticed he needed help cutting his food    OT Problem List: Decreased strength;Decreased activity tolerance;Impaired balance (sitting and/or standing);Decreased cognition;Decreased knowledge of use of DME or AE   OT Treatment/Interventions: Self-care/ADL training;Therapeutic exercise;DME and/or AE instruction;Therapeutic activities;Patient/family education;Balance training      OT Goals(Current goals can be found in the care plan section)   Acute Rehab OT Goals Patient Stated Goal: go home OT Goal Formulation: With patient Time For Goal Achievement: 07/25/24 Potential to Achieve Goals: Good   OT Frequency:  Min 1X/week    Co-evaluation              AM-PAC OT 6 Clicks Daily Activity     Outcome Measure Help from another person eating meals?: A Little Help from another person taking care of personal grooming?: A Little Help from another person toileting, which includes using toliet, bedpan, or urinal?: A Lot Help from another person bathing (including washing, rinsing, drying)?: A Lot Help from another person to put on and taking off regular upper body clothing?: A Little Help from another person to put on and taking off regular lower body  clothing?: A Lot 6 Click Score: 15   End of Session Equipment Utilized During Treatment: Gait belt;Rolling walker (2 wheels) Nurse Communication: Mobility status  Activity Tolerance: Patient tolerated treatment well Patient left: with call bell/phone within reach;in chair;with chair alarm set  OT Visit Diagnosis: Unsteadiness on feet (R26.81);Muscle weakness (generalized) (M62.81);Other symptoms and signs involving cognitive function;Cognitive communication deficit (R41.841)                Time: 9076-9059 OT Time Calculation (min): 17 min Charges:  OT General Charges $OT Visit: 1 Visit OT Evaluation $OT Eval Moderate Complexity: 1 Mod  Elma JONETTA Lebron FREDERICK, OTR/L [facility] Acute Rehabilitation Office: [phone]   Elma JONETTA Lebron 07/11/2024, 10:34 AM

## 2024-07-11 NOTE — TOC Transition Note (Signed)
 Transition of Care [facility]) - Discharge Note   Patient Details  Name: Juan [name] MRN: [id number] Date of Birth: Mar 18, 1936  Transition of Care [facility]) CM/SW Contact:  Gwenn Julien Norris, [place] Phone Number: 07/11/2024, 11:32 AM   Clinical Narrative: Pt for dc back to [facility] today where he is a LTC/SNF resident. Spoke to R[facility] at [facility] [phone] who confirmed they are prepared for pt to return to room N-11. DC summary faxed to facility [phone]. RN provided with number for report and [facility] arranged for transport. Pt's dtr [name] aware of dc and reports agreeable. SW signing off at dc.     Julien Gwenn, MSW, LCSW [phone] (coverage)        Final next level of care: Skilled Nursing Facility Barriers to Discharge: Barriers Resolved   Patient Goals and CMS Choice            Discharge Placement              Patient chooses bed at: [facility] Patient to be transferred to facility by: [facility]   Patient and family notified of of transfer: 07/11/24  Discharge Plan and Services Additional resources added to the After Visit Summary for                                       Social Drivers of Health (SDOH) Interventions SDOH Screenings   Food Insecurity: No Food Insecurity (08/16/2023)  Housing: Low Risk (08/16/2023)  Transportation Needs: No Transportation Needs (08/16/2023)  Utilities: Not At Risk (08/16/2023)  Alcohol Screen: Low Risk (08/16/2023)  Depression (PHQ2-9): Low Risk (03/30/2024)  Financial Resource Strain: Low Risk (08/16/2023)  Physical Activity: Insufficiently Active (08/16/2023)  Social Connections: Socially Isolated (08/16/2023)  Stress: No Stress Concern Present (08/16/2023)  Tobacco Use: Medium Risk (07/01/2024)  Health Literacy: Adequate Health Literacy (08/16/2023)     Readmission Risk Interventions     No data to display

## 2024-07-11 NOTE — Progress Notes (Signed)
 "  Palliative Medicine Inpatient Follow Up Note   HPI:  89 y.o. male  with past medical history significant of DM type II, hypertension, hyperlipidemia, CAD, status post CABG, CKD, chronic hearing loss, GERD, gout, prior CVA/TIAs.  Admitted on 07/10/2024 with acute onset of slurred speech, drooling, bilateral lower extremity weakness, at the facility as he was trying to have his lunch with his daughter today.  Patient's symptoms started about 1230 this afternoon but progressively been improving.  Code stroke was called.   CT head done, no acute intracranial abnormalities, remote right basal ganglia infarct, right cochlear implant.  Due to cochlear device, unable to obtain MRI of the brain.  Neurologist was consulted.     PMT has been consulted to assist with goals of care conversation. Patient/Family face treatment option decisions, advanced directive decisions and anticipatory care needs.    Family face treatment option decision, advance directive decisions and anticipatory care needs.   This is hospital day #: 1  Today's Discussion 07/11/2024  I have reviewed medical records including:  EPIC notes: Reviewed neurology Dr. Shelton procedural note for EEG done yesterday, the study is suggestive of nonspecific cortical dysfunction.  No seizures or epileptiform discharges were seen during the recording. Reviewed to track clinical course and prognostication.  Vital signs: From 07/11/2024 at 0 7:39 AM: Temperature 97.7 F, blood pressure 164/80, heart rate 86 bpm, respiration 17 breaths/min, saturating at 97% on room air.  To allow permissive hypertension. MAR: Note that majority of patient's oral scheduled medications were not given due to patient's refusal.  Reviewed PRN meds received over the last 24 hours. Reviewed to assess needs for medication adjustment to optimize comfort.  Available advanced directives in ACP: Patient has pre-existing DNR and healthcare power of attorney document uploaded on  EHR. Labs: No recent labs.  Met with patient and his daughter Juan [name] at bedside today.   Visited patient at bedside with family member present. The patient was awake, alert, and coherent, pleasant in demeanor, and in no acute distress. Sitting in a recliner having his breakfast. Ate more than 50 %.  Breathing spontaneously on room air without difficulty. Slurred speech has been noted but this is his baseline per daughter. No verbal or nonverbal indicators of pain or discomfort noted. The patient was able to verbalize needs and engage in meaningful conversation.  Patient is eager to be discharged today.  Patient voiced appreciation for the care he received in his short stay in the hospital.   We reviewed our conversation from yesterday. Provided update about patient's clinical state. Continue with DNR/DNI status. The daughter Juan [name] shared that patient is very well improved from yesterday. Confusion and agitation has been resolved. They are hopeful to be discharged before the storm roles in this afternoon.   Created space and opportunity for patient to explore thoughts feelings and fears regarding current medical situation.  Discussed the importance of continued conversation with family and their  medical providers regarding overall plan of care and treatment options, ensuring decisions are within the context of the patients values and GOCs. The patient, family, and medical team were encouraged to revisit these discussions regularly, as preferences and circumstances may change over time.  Questions and concerns addressed   Palliative Support Provided.   Coordinated plan of care with primary RN. RN reports plan for discharge today, just awaiting for echo results.   Objective Assessment: Vital Signs Vitals:   07/11/24 0359 07/11/24 0739  BP: (!) 157/84 (!) 164/80  Pulse: 75  86  Resp: 18 17  Temp: (!) 97.5 F (36.4 C) 97.7 F (36.5 C)  SpO2: 98% 97%    Intake/Output Summary (Last 24 hours)  at 07/11/2024 0917 Last data filed at 07/10/2024 2208 Gross per 24 hour  Intake 6 ml  Output --  Net 6 ml   Last Weight  Most recent update: 07/10/2024  2:13 PM    Weight  75 kg (165 lb 5.5 oz)             Physical Exam Vitals and nursing note reviewed.  Constitutional:      Comments: Sitting in a recliner having his breakfast. He is in good spirit.   HENT:     Head: Normocephalic and atraumatic.     Ears:     Comments: Cochlear implant due to hearing loss    Mouth/Throat:     Mouth: Mucous membranes are moist.  Cardiovascular:     Rate and Rhythm: Normal rate.  Pulmonary:     Effort: Pulmonary effort is normal.  Abdominal:     General: Abdomen is flat. Bowel sounds are normal.     Palpations: Abdomen is soft.  Musculoskeletal:     Cervical back: Normal range of motion.     Comments: Generalized weakness  Skin:    General: Skin is warm.  Neurological:     Mental Status: He is alert.     Comments: Confusion/disorientation noted  Psychiatric:     Comments: Anxious/mildly agitated     SUMMARY OF RECOMMENDATIONS    # Complex medical decision-making/goals of care  Code Status: Maintain DNR/DNI status Continue with current medical interventions to an extent this is possible.  Patient and family reports significant improvement in mentation from yesterday and is now back at baseline.  DNR form completed and placed on file.  Plan to be discharged today back to [facility] pending Echo result.   # Symptom management Per Primary team Palliative medicine is available to assist as needed.    # Psychosocial support Continue to provide psycho-social and emotional support to patient and family.  # Discharge planning  Return to [facility]  # Prognosis  Prognosis is poor given patient's advanced age, and high disease burden.    # Treatment plan discussed with: Patient, patient's daughter Juan [name], nursing, LCSW, and medical team.   I personally spent a total of  35 minutes in the care of the patient today including preparing to see the patient, getting/reviewing separately obtained history, performing a medically appropriate exam/evaluation, documenting clinical information in the EHR, and coordinating care.    Thank you for allowing us  to participate in the care of Juan [name]   ______________________________________________________________________________________ Kathlyne Bolder NP-C [facility]iative Medicine Team Team Cell Phone: [phone] Please utilize secure chat with additional questions, if there is no response within 30 minutes please call the above phone number  Palliative Medicine Team providers are available by phone from 7am to 7pm daily and can be reached through the team cell phone.  Should this patient require assistance outside of these hours, please call the patient's attending physician.     "

## 2024-07-11 NOTE — Progress Notes (Signed)
 Attempted to call for report again [phone]  @[facility] . No answering. AVS/DNR form and medication ready. Waiting for [facility]AR to transport to disposition. Family at bedside updated.

## 2024-07-11 NOTE — Discharge Summary (Addendum)
 " Physician Discharge Summary   Patient: Juan [name] MRN: [id number] DOB: [birth year]  Admit date:     07/10/2024  Discharge date: 07/11/24  Discharge Physician: Juan [name]   PCP: Juan Fredia CROME, MD   Recommendations at discharge:    Please follow Urine culture results. He will be discharge on Keflex .  Patient will need Brilinta   and aspirin  for 3 month--then Brilinta  if required by his cardiology, or aspirin .  Follow A1C results.  Discharge today after ECHO results.  He will need PT OT speech therapy at rehab  Discharge Diagnoses: Principal Problem:   Stroke [facility]) Active Problems:   CKD (chronic kidney disease)   Mixed hyperlipidemia   History of gout   Essential hypertension, benign   Coronary atherosclerosis   TIA (transient ischemic attack)   Type 2 diabetes mellitus with renal complication (HCC)   HOH (hard of hearing)   Vitamin D  deficiency   Vitamin B12 deficiency   Benign prostatic hyperplasia with urinary obstruction  Resolved Problems:   * No resolved hospital problems. *  Hospital Course: 89 year old with past medical history significant for diabetes type 2, hypertension, hyperlipidemia, CAD, status post CABG, CKD, chronic hearing loss, CAD, gout, CVA, TIA who presents complaining of sudden onset of slurred speech, drooling bilateral lower extremity weakness, symptoms started around 12:30 in the afternoon. Patient presented as a code stroke. CT head no acute intracranial abnormality. Patient symptoms improved for this reason he was not a candidate for TNK.     Assessment and Plan: 1-TIA versus Stroke 1/23: CT head: No acute intracranial abnormality.  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 -CT angio head and neck: Atherosclerotic irregularity of multiple left MCA branch.  Focal severe stenosis M2 superior division branch.  Severe stenosis M2 branch distal.  Severe stenosis in 4.  Severe stenosis P3 and segment of the right PCA.  Moderate stenosis distal  A2 and segment of the left ACA. Unable to get MRI due to cochlear implant Repeat CT head in 24 hours.No acute or evolving infarct identified by CT  Echo: Pending.  PT OT speech eval A1c, LDL: 85 ZZH:Uypd study is suggestive of non specific cortical dysfunction arising from left hemisphere. No seizures or epileptiform discharges were seen throughout the recording.  Patient will need Brilinta  and aspirin  for 3 months, subsequently Brilinta  and or aspirin  depending on cardiology recommendation due to his history of CAD He has intolerance to statins.  Continue Setia   2-CKD 3A Previous creatinine baseline 1.4 Stable Hypomagnesemia: Replace   UTI: Pyuria;  Sent urine culture.  CIWA dose of IV ceftriaxone  He will be discharged on Keflex  for 2 more days.  Follow urine culture.  B12 deficiency, - Resume B12 at discharge   Vitamin D  deficiency - Resume vitamin D    Hard of hearing - Supportive care     Diabetes type 2 - Follow A1c.  Resume metformin  in 48 hours after contrast exposure     CAD - Continue Setia.  He will need to discuss with cardiology if he will need Brilinta  after he completed the 32-month course for stroke. Failed Plavix  for stroke prevention   Hypertension - Zoom lisinopril  at discharge   Hyperlipidemia:  -   Benign prostatic hyperplasia -Continue Flomax  and Proscar           Consultants: neurology  Procedures performed: ECHO  Disposition: Skilled nursing facility Diet recommendation:  Regular diet DISCHARGE MEDICATION: Allergies as of 07/11/2024       Reactions  Simvastatin  Other (See Comments)   Possible statin-induced pancreatitis        Medication List     PAUSE taking these medications    metFORMIN  500 MG tablet Wait to take this until: July 13, 2024 Commonly known as: GLUCOPHAGE  Take 1,000 mg by mouth every morning.   metFORMIN  HCl 750 MG Tabs Wait to take this until: July 13, 2024 Take 750 mg by mouth every evening.        STOP taking these medications    clopidogrel  75 MG tablet Commonly known as: PLAVIX        TAKE these medications    acetaminophen  500 MG tablet Commonly known as: TYLENOL  Take 1,000 mg by mouth 2 (two) times daily.   allopurinol  100 MG tablet Commonly known as: ZYLOPRIM  Take 100 mg by mouth daily.   aspirin  81 MG chewable tablet Chew 1 tablet (81 mg total) by mouth daily. Start taking on: July 12, 2024   cephALEXin  500 MG capsule Commonly known as: KEFLEX  Take 1 capsule (500 mg total) by mouth 3 (three) times daily for 2 days.   cholecalciferol  25 MCG (1000 UNIT) tablet Commonly known as: VITAMIN D3 Take 1,000 Units by mouth daily.   colchicine  0.6 MG tablet Take 0.6 mg by mouth daily as needed (as directed for gout flares).   ezetimibe  10 MG tablet Commonly known as: ZETIA  Take 10 mg by mouth daily.   Fenofibric Acid  135 MG Cpdr Take 1 tablet by mouth in the morning.   finasteride  5 MG tablet Commonly known as: PROSCAR  Take 5 mg by mouth every evening.   guaiFENesin -dextromethorphan 100-10 MG/5ML syrup Commonly known as: ROBITUSSIN DM Take 10 mLs by mouth every 6 (six) hours as needed for cough.   lisinopril  20 MG tablet Commonly known as: ZESTRIL  Take 20 mg by mouth daily.   polyethylene glycol powder 17 GM/SCOOP powder Commonly known as: GLYCOLAX /MIRALAX  Take 17 g by mouth 2 (two) times a week. What changed: additional instructions   tamsulosin  0.4 MG Caps capsule Commonly known as: FLOMAX  Take 0.4 mg by mouth at bedtime.   ticagrelor  90 MG Tabs tablet Commonly known as: BRILINTA  Take 1 tablet (90 mg total) by mouth 2 (two) times daily.        Contact information for after-discharge care     Destination     [facility] .   Service: Skilled Nursing Contact information: [phone] [place]  [address] [phone]                    Discharge Exam: Juan [name]   07/10/24 1413   Weight: 75 kg   General; NAD  Condition at discharge: stable  The results of significant diagnostics from this hospitalization (including imaging, microbiology, ancillary and laboratory) are listed below for reference.   Imaging Studies: EEG adult Result Date: 07/11/2024 Shelton Arlin KIDD, MD     07/11/2024  8:21 AM Patient Name: MARQUE RADEMAKER MRN: [id number] Epilepsy Attending: Arlin KIDD Shelton Referring Physician/Provider: Remi Pippin, NP Date: 07/10/2024 Duration: 28.12 mins Patient history: 89yo M with slurred speech, drooling, bilateral lower extremity weakness. EEG to evaluate for seizure Level of alertness: Awake AEDs during EEG study: None Technical aspects: This EEG study was done with scalp electrodes positioned according to the 10-20 International system of electrode placement. Electrical activity was reviewed with band pass filter of 1-70Hz , sensitivity of 7 uV/mm, display speed of 21mm/sec with a 60Hz  notched filter applied as appropriate. EEG data  were recorded continuously and digitally stored.  Video monitoring was available and reviewed as appropriate. Description: The posterior dominant rhythm consists of 9 Hz activity of moderate voltage (25-35 uV) seen predominantly in posterior head regions, symmetric and reactive to eye opening and eye closing. EEG showed intermittent 5 to 6 Hz theta slowing in ;left hemisphere. Hyperventilation and photic stimulation were not performed.   ABNORMALITY - Intermittent slow, left hemisphere IMPRESSION: This study is suggestive of non specific cortical dysfunction arising from left hemisphere. No seizures or epileptiform discharges were seen throughout the recording. Priyanka MALVA Krebs   CT HEAD WO CONTRAST ( ) Result Date: 07/11/2024 EXAM: CT HEAD WITHOUT CONTRAST 07/11/2024 05:16:57 AM TECHNIQUE: CT of the head was performed without the administration of intravenous contrast. Automated exposure control, iterative reconstruction, and/or weight based  adjustment of the mA/kV was utilized to reduce the radiation dose to as low as reasonably achievable. COMPARISON: CT head from 07/10/2024 and earlier. CLINICAL HISTORY: 89 year old male. Stroke, follow up. Hospitalized for code stroke presentation. Severe left middle cerebral artery branch stenosis or short segment occlusion on computed tomography angiography. Other severe intracranial atherosclerosis and stenosis. FINDINGS: BRAIN AND VENTRICLES: No acute hemorrhage. No hydrocephalus. No extra-axial collection. No mass effect or midline shift. Stable brain volume. Asymmetric heterogeneity in the bilateral cerebral white matter, bilateral deep gray nuclei. Stable gray white differentiation. Advanced calcified atherosclerosis at the skull base. ORBITS: No gaze deviation. SINUSES: Tympanic cavities, mastoids and paranasal sinuses remain well aerated. SOFT TISSUES AND SKULL: Right side cochlear implant redemonstrated with streak artifact. No adverse features. No acute soft tissue abnormality. No skull fracture. IMPRESSION: 1. No acute or evolving infarct identified by CT. No acute intracranial abnormality. Electronically signed by: Helayne Hurst MD 07/11/2024 05:27 AM EST RP Workstation: [id number]   CT ANGIO HEAD NECK W WO CM (CODE STROKE) Result Date: 07/10/2024 EXAM: CTA HEAD AND NECK WITH AND WITHOUT 07/10/2024 02:24:23 PM TECHNIQUE: CTA of the head and neck was performed with and without the administration of intravenous contrast. 75 mL of iohexol  (OMNIPAQUE ) 350 MG/ML injection was administered. Multiplanar 2D and/or 3D reformatted images are provided for review. Automated exposure control, iterative reconstruction, and/or weight based adjustment of the mA/kV was utilized to reduce the radiation dose to as low as reasonably achievable. Stenosis of the internal carotid arteries measured using NASCET criteria. COMPARISON: Same day CT head as well as CTA head and neck 10/21/2020. CLINICAL HISTORY: Neuro deficit,  acute, stroke suspected. Acute neurological deficit, stroke suspected. FINDINGS: CTA NECK: AORTIC ARCH AND ARCH VESSELS: Mild atherosclerosis of the aortic arch. Atherosclerosis of the proximal subclavian arteries without significant stenosis. No dissection or arterial injury. CERVICAL CAROTID ARTERIES: Mild atherosclerosis at the right carotid bifurcation without hemodynamically significant stenosis. Mild atherosclerosis at the left carotid bifurcation without hemodynamically significant stenosis. Partial retropharyngeal course of the proximal left cervical ICA. Tortuosity of the left cervical ICA. No dissection or arterial injury. CERVICAL VERTEBRAL ARTERIES: Atherosclerosis at the right vertebral artery origin results in severe stenosis. Mild tortuosity of the right V1 segment. Atherosclerosis at the left vertebral artery origin resulting in mild stenosis. Tortuosity of the left V1 segment. No dissection or arterial injury. LUNGS AND MEDIASTINUM: Unremarkable. SOFT TISSUES: No acute abnormality. BONES: Sequelae of median sternotomy. Degenerative changes in the visualized spine. CTA HEAD: ANTERIOR CIRCULATION: There is sclerosis in the bilateral carotid siphons. There is mild stenosis of the right supraclinoid ICA. The anterior cerebral arteries are patent bilaterally. Moderate stenosis of the distal A2 segment of the  left ACA. The M1 segment of the left MCA is patent. Proximal M2 branches of the left MCA are patent. There is atherosclerotic irregularity of multiple left MCA branches. Focal severe stenosis of an M2 superior division branch of the left MCA (series 7, image 82). Additional severe stenosis of an M2 superior division branch of the left MCA (series 7, image 82). Additional severe stenosis versus short segment occlusion of a distal M2 branch of the left MCA (series 7, image 97 and series 11, image 146). There is severe stenosis versus short segment occlusion of an M4 branch of the left MCA over the  inferior aspect of the parietal lobe (series 7, image 88). The right MCA is patent. Evaluation is somewhat limited due to streak artifact. Within these limitations, there are areas of mild atherosclerotic irregularity of right MCA branches without evidence of severe stenosis or occlusion. No aneurysm. POSTERIOR CIRCULATION: Severe stenosis of the P3 segment of the right PCA. Moderate stenosis of the mid P2 segment of the left PCA. No significant stenosis of the basilar artery. No significant stenosis of the vertebral arteries. No aneurysm. IMPRESSION: 1. Atherosclerotic irregularity of multiple left MCA branches. Focal severe stenosis of an M2 superior division branch. Additional severe stenosis versus short segment occlusion of a distal M2 branch. Severe stenosis versus short segment occlusion of an M4 branch over the inferior left parietal lobe. Findings are new since the 2022 CTA. 2. Severe stenosis of the P3 segment of the right PCA and moderate stenosis of the mid P2 segment of the left PCA, increased from prior. 3. Moderate stenosis of the distal A2 segment of the left ACA, similar to prior. 4. Severe stenosis at the right vertebral artery origin, similar to prior. 5. Impression 1 messaged to Dr. Voncile via the [facility] messaging system at 2:37 PM on 07/10/24. Electronically signed by: Donnice Mania MD 07/10/2024 02:53 PM EST RP Workstation: [id number]   CT HEAD CODE STROKE WO CONTRAST Result Date: 07/10/2024 EXAM: CT HEAD WITHOUT CONTRAST 07/10/2024 02:23:58 PM TECHNIQUE: CT of the head was performed without the administration of intravenous contrast. Automated exposure control, iterative reconstruction, and/or weight based adjustment of the mA/kV was utilized to reduce the radiation dose to as low as reasonably achievable. COMPARISON: CT head 02/03/2023. CLINICAL HISTORY: Acute neurological deficit, stroke suspected. FINDINGS: BRAIN AND VENTRICLES: Right sided cochlear implant noted with associated streak  artifact which slightly limits evaluation particularly along the right temporal and occipital lobes. Mild chronic microvascular ischemic changes. Mild generalized parenchymal volume loss. 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 Atherosclerosis involving the carotid siphons and intracranial vertebral arteries. No acute hemorrhage. No evidence of acute infarct. No hydrocephalus. No extra-axial collection. No mass effect or midline shift. ORBITS: Right lens replacement. SINUSES: No acute abnormality. SOFT TISSUES AND SKULL: Right sided cochlear implant noted. No skull fracture. [place] stroke program early CT (ASPECT) score: Ganglionic (caudate, internal capsule, lentiform nucleus, insula, M1-M3): 7 Supraganglionic (M4-M6): 3 Total: 10 IMPRESSION: 1. No acute intracranial abnormality. 2. ASPECTS is 10. 3. 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 4. Evaluation is mildly limited by streak artifact from a right cochlear implant. 5. Findings messaged to Dr. Arora via the [facility] messaging system at 2:37 PM on 07/10/24. Electronically signed by: Donnice Mania MD 07/10/2024 02:38 PM EST RP Workstation: [id number]    Microbiology: Results for orders placed or performed during the hospital encounter of 02/03/23  Urine Culture     Status: Abnormal   Collection Time: 02/03/23  9:02  PM   Specimen: Urine, Random  Result Value Ref Range Status   Specimen Description   Final    URINE, RANDOM Performed at [facility], [address] [place], [place] [address]    Special Requests   Final    NONE Reflexed from [id number] Performed at [facility], [address], [place], [place] [address]    Culture >=100,000 COLONIES/mL KLEBSIELLA PNEUMONIAE (A)  Final   Report Status 02/06/2023 FINAL  Final   Organism ID, Bacteria KLEBSIELLA PNEUMONIAE (A)  Final      Susceptibility   Klebsiella pneumoniae - MIC*    AMPICILLIN >=32 RESISTANT Resistant     CEFAZOLIN  <=4 SENSITIVE Sensitive      CEFEPIME <=0.12 SENSITIVE Sensitive     CEFTRIAXONE  <=0.25 SENSITIVE Sensitive     CIPROFLOXACIN <=0.25 SENSITIVE Sensitive     GENTAMICIN <=1 SENSITIVE Sensitive     IMIPENEM <=0.25 SENSITIVE Sensitive     NITROFURANTOIN 64 INTERMEDIATE Intermediate     TRIMETH/SULFA <=20 SENSITIVE Sensitive     AMPICILLIN/SULBACTAM 4 SENSITIVE Sensitive     PIP/TAZO <=4 SENSITIVE Sensitive     * >=100,000 COLONIES/mL KLEBSIELLA PNEUMONIAE    Labs: CBC: Recent Labs  Lab 07/10/24 1433 07/10/24 1434 07/10/24 1929 07/11/24 0758  WBC  --  6.7 7.0 6.2  NEUTROABS  --  4.2  --   --   HGB 12.2* 12.3* 11.6* 12.1*  HCT 36.0* 37.5* 34.7* 36.3*  MCV  --  91.7 88.1 89.2  PLT  --  327 352 335   Basic Metabolic Panel: Recent Labs  Lab 07/10/24 1433 07/10/24 1434 07/10/24 1929 07/11/24 0758  NA 141 137  --  142  K 4.1 4.1  --  3.5  CL 106 103  --  107  CO2  --  22  --  24  GLUCOSE 171* 175*  --  102*  BUN 36* 28*  --  21  CREATININE 1.40* 1.28*  --  1.12  CALCIUM  --  10.6*  --  9.9  MG  --   --  1.5*  --   PHOS  --   --  2.6  --    Liver Function Tests: Recent Labs  Lab 07/10/24 1434  AST 36  ALT 15  ALKPHOS 48  BILITOT 0.3  PROT 6.7  ALBUMIN 3.9   CBG: Recent Labs  Lab 07/10/24 1408 07/10/24 1819 07/10/24 2018 07/11/24 0630  GLUCAP 165* 161* 102* 103*    Discharge time spent: greater than 30 minutes.  Signed: Owen DELENA Lore, MD [facility] 07/11/2024 "

## 2024-07-11 NOTE — Progress Notes (Signed)
 Attempted to call for report  twice [phone]  @[facility] . No answering. Left message to call back.

## 2024-07-11 NOTE — Evaluation (Signed)
 Physical Therapy Evaluation  Patient Details Name: Juan [name] MRN: [id number] DOB: 1935-08-20 Today's Date: 07/11/2024  History of Present Illness  Pt is an 89 y.o. male presenting 07/10/24 with acute onset slurred speech and BLE weakness. CTH negative. PMH: CAD s/p CABG, HTN, HLD, CVA, hearing loss s/p cochlear implant.  Clinical Impression  Pt admitted with above diagnosis. Pt currently with functional limitations due to the deficits listed below (see PT Problem List). At the time of PT eval pt was able to perform transfers and minimal ambulation with up to mod assist for balance, walker management, and cues. Daughter present and reports that pt has had too many falls with the RW and he does not ambulate at all anymore, only transfers for safety. Recommend post-acute PT follow up <3 hours/day to maximize functional independence, safety, and decrease risk for falls. Pt will benefit from acute skilled PT to increase their independence and safety with mobility to allow discharge.           If plan is discharge home, recommend the following: A lot of help with walking and/or transfers;A lot of help with bathing/dressing/bathroom;Assistance with cooking/housework;Assistance with feeding;Assist for transportation;Help with stairs or ramp for entrance;Supervision due to cognitive status   Can travel by private vehicle   Yes    Equipment Recommendations None recommended by PT  Recommendations for Other Services       Functional Status Assessment Patient has had a recent decline in their functional status and demonstrates the ability to make significant improvements in function in a reasonable and predictable amount of time.     Precautions / Restrictions Precautions Precautions: Fall Recall of Precautions/Restrictions: Impaired Precaution/Restrictions Comments: Reads lips, quick to move at times Restrictions Weight Bearing Restrictions Per Provider Order: No      Mobility  Bed  Mobility               General bed mobility comments: Pt was received sitting up in the recliner.    Transfers Overall transfer level: Needs assistance Equipment used: Rolling walker (2 wheels) Transfers: Sit to/from Stand Sit to Stand: Mod assist, +2 safety/equipment           General transfer comment: VC's for hand placement on seated surface for safety. P twas able to power up to full stand with assist, however increased time to achieve knee extension in standing.    Ambulation/Gait Ambulation/Gait assistance: Mod assist, +2 safety/equipment Gait Distance (Feet): 5 Feet Assistive device: Rolling walker (2 wheels) Gait Pattern/deviations: Decreased stride length, Shuffle, Trunk flexed, Narrow base of support Gait velocity: Decreased Gait velocity interpretation: <1.31 ft/sec, indicative of household ambulator   General Gait Details: Pt able to take shuffling steps forward with RW for support. Assist for balance, walker management. Chair follow utilized. Pt's daughter and son-in-law present and report he does not walk at all anymore because he was having too many falls, even with the walker.  Stairs            Wheelchair Mobility     Tilt Bed    Modified Rankin (Stroke Patients Only) Modified Rankin (Stroke Patients Only) Pre-Morbid Rankin Score: Moderately severe disability Modified Rankin: Moderately severe disability     Balance Overall balance assessment: Needs assistance Sitting-balance support: No upper extremity supported, Feet supported Sitting balance-Leahy Scale: Fair Sitting balance - Comments: Supervision for short periods   Standing balance support: Bilateral upper extremity supported, During functional activity, Reliant on assistive device for balance Standing balance-Leahy Scale: Poor Standing balance  comment: device and external support statically and dynamically                             Pertinent Vitals/Pain Pain  Assessment Pain Assessment: No/denies pain    Home Living Family/patient expects to be discharged to:: Skilled nursing facility                   Additional Comments: from [facility] as a resident    Prior Function Prior Level of Function : Needs assist             Mobility Comments: assist for bed mobility, SPT to wheelchair; able to propel wheelchair with BLE ADLs Comments: assist for LB ADL, changing brief. able to feed and groom self per family report. until christmas they noticed he needed help cutting his food     Extremity/Trunk Assessment   Upper Extremity Assessment Upper Extremity Assessment: Defer to OT evaluation LUE Deficits / Details: 4/5 grip as compared to 5/5 R. otherwise BUE WFL bilaterally    Lower Extremity Assessment Lower Extremity Assessment: Generalized weakness (MMT strength good in quads/hamstrings/hip flexors and ankle DF from a seated position. However, pt with poor muscular endurance and difficulty with standing activity.)    Cervical / Trunk Assessment Cervical / Trunk Assessment: Other exceptions Cervical / Trunk Exceptions: Forward head posture with rounded shoulders  Communication   Communication Communication: Impaired Factors Affecting Communication: Hearing impaired (has cochlear implant but often not wearing; reads lips)    Cognition Arousal: Alert Behavior During Therapy: WFL for tasks assessed/performed                             Following commands: Impaired Following commands impaired: Follows one step commands with increased time, Follows one step commands inconsistently (predominantly 2/2 hearing deficits with cochlear implant not donned for session)     Cueing Cueing Techniques: Verbal cues, Gestural cues, Tactile cues, Visual cues     General Comments General comments (skin integrity, edema, etc.): son in law and oldest daughter present and helped with history taking    Exercises      Assessment/Plan    PT Assessment Patient needs continued PT services  PT Problem List Decreased strength;Decreased activity tolerance;Decreased balance;Decreased mobility;Decreased knowledge of use of DME;Decreased safety awareness;Decreased knowledge of precautions       PT Treatment Interventions DME instruction;Gait training;Functional mobility training;Therapeutic activities;Therapeutic exercise;Balance training;Patient/family education;Wheelchair mobility training    PT Goals (Current goals can be found in the Care Plan section)  Acute Rehab PT Goals Patient Stated Goal: none stated PT Goal Formulation: With patient/family Time For Goal Achievement: 07/18/24 Potential to Achieve Goals: Good    Frequency Min 1X/week     Co-evaluation PT/OT/SLP Co-Evaluation/Treatment: Yes Reason for Co-Treatment: For patient/therapist safety;To address functional/ADL transfers;Necessary to address cognition/behavior during functional activity PT goals addressed during session: Mobility/safety with mobility;Balance;Proper use of DME;Strengthening/ROM         AM-PAC PT 6 Clicks Mobility  Outcome Measure Help needed turning from your back to your side while in a flat bed without using bedrails?: None Help needed moving from lying on your back to sitting on the side of a flat bed without using bedrails?: A Lot Help needed moving to and from a bed to a chair (including a wheelchair)?: A Lot Help needed standing up from a chair using your arms (e.g., wheelchair or bedside  chair)?: A Lot Help needed to walk in hospital room?: A Lot Help needed climbing 3-5 steps with a railing? : Total 6 Click Score: 13    End of Session Equipment Utilized During Treatment: Gait belt Activity Tolerance: Patient tolerated treatment well Patient left: in chair;with call bell/phone within reach;with chair alarm set;with family/visitor present Nurse Communication: Mobility status PT Visit Diagnosis:  Difficulty in walking, not elsewhere classified (R26.2);Other symptoms and signs involving the nervous system (R29.898)    Time: 9076-9059 PT Time Calculation (min) (ACUTE ONLY): 17 min   Charges:   PT Evaluation $PT Eval Moderate Complexity: 1 Mod   PT General Charges $$ ACUTE PT VISIT: 1 Visit         Leita Sable, PT, DPT Acute Rehabilitation Services Secure Chat Preferred Office: [phone]   Leita JONETTA Sable 07/11/2024, 11:31 AM

## 2024-07-11 NOTE — Progress Notes (Signed)
" ° °  Brief Progress Note   _____________________________________________________________________________________________________________  Patient Name: Juan [name] Patient DOB: [birth year] Date: @TODAY @   07/11/2024 1130  Data: Received notification that patient needs ECHO results for hopeful discharge today.   Action: Contacted on call provider via AMION page to make aware of the need.   Response:  Verbal confirmation via phone that provider is aware and ECHO will be read shortly.   _____________________________________________________________________________________________________________  [facility] RN Expeditor Ingeborg Fite, Corean Lobstein Please contact us  directly via secure chat (search for [facility]) or by calling us  at [phone] [facility]).  "

## 2024-07-11 NOTE — Evaluation (Signed)
 Clinical/Bedside Swallow Evaluation Patient Details  Name: Juan [name] MRN: [id number] Date of Birth: 06-24-1935  Today's Date: 07/11/2024 Time: SLP Start Time (ACUTE ONLY): 9043 SLP Stop Time (ACUTE ONLY): 1011 SLP Time Calculation (min) (ACUTE ONLY): 15 min  Past Medical History:  Past Medical History:  Diagnosis Date   Cancer (HCC)    skin   CKD (chronic kidney disease) 06/24/2013   Cochlear implant in place    Coronary artery disease    a. s/p CABG 2000; b. myoview 9/08: inf-septal scar with mild peri-infarct ischemia (low risk);  c.  Echo 8/13: mild focal basal septal hypertrophy, Gr 1 DD, mild LAE;  d. Lexiscan  Myoview (06/20/2013): No reversible ischemia, inferior and septal infarct, inferior and septal hypokinesis, EF 60%;  e.  Echo (06/20/2011): EF 50-55%, normal wall motion, grade 1 diastolic dysfunction, mild LAE   Diverticular disease    Duodenitis    Elevated PSA    GERD (gastroesophageal reflux disease)    Gout    Humerus fracture    right   Hydronephrosis with ureteropelvic junction obstruction    Hypercalcemia 02/02/2012   a. 01/2012: 10.7.   Hyperlipidemia    Hypertension    Pancreatitis    a. ? Simvastatin  related   Pneumonia age 50 or 7   I think only once (06/19/2013)   Syncope    a. 06/2013   TIA (transient ischemic attack)    a. 01/2012 - presentation as acute imbalance;  b. 01/2012 carotid U/S w/o signif stenosis;  c. 01/2012 Echo: EF 60-65%, Gr 1 DD.;  d.  Carotid US  (06/20/2013): 1-39% bilateral ICA stenosis   Tubular adenoma polyp of rectum    Type II diabetes mellitus (HCC)    diet and exercise controlled at present (06/19/2013)   Past Surgical History:  Past Surgical History:  Procedure Laterality Date   APPENDECTOMY     CARDIAC CATHETERIZATION  06/18/1998   CHOLECYSTECTOMY     COCHLEAR IMPLANT Right 02/16/1989   COLONOSCOPY W/ POLYPECTOMY     no polyp 2012   CORONARY ARTERY BYPASS GRAFT  06/18/1998   CABG X5   INGUINAL HERNIA REPAIR Right  09/15/2015   Procedure: OPEN REPAIR RIGHT INGUINAL HERNIA ;  Surgeon: Krystal Russell, MD;  Location: [facility] ORS;  Service: General;  Laterality: Right;   INSERTION OF MESH Right 09/15/2015   Procedure: INSERTION OF MESH;  Surgeon: Krystal Russell, MD;  Location: [facility] ORS;  Service: General;  Laterality: Right;   PARATHYROIDECTOMY  06/18/1976   POLYPECTOMY     REVERSE SHOULDER ARTHROPLASTY Right 12/30/2014   Procedure: Right shoulder ORIF, Biomet S3 Plate and ZIm[employer] cable system ;  Surgeon: Marcey Her, MD;  Location: [facility];  Service: Orthopedics;  Laterality: Right;   right ankle surgery  age 70   TONSILLECTOMY  06/18/1941   trachestomy as child for throat closing     HPI:  Juan [name] is a 89 year old male with extensive history of DM II, HTN, HLD, CAD, S/P CABG, CKD, chronic hearing loss, GERD, gout, CVA/TIAs.  He presented to ED with chief complaint of acute onset of slurred speech, drooling, bilateral lower extremity weakness, at the facility as he was trying to have his lunch with his daughter.  Patient's symptoms started about 1230 this afternoon but progressively been improving.   Patient had communication/cognitive evaluation May 2022 with findings of dysarthria that was baseline.  CT head was showing no acute or evolving infarct.    Assessment / Plan / Recommendation  Clinical Impression  Clinical swallowing evaluation was completed at request of the medical team. RN approved patient for PO intake.  Patient with a fail on the RN dysphagia screen due to being unable to continuously drink the 3oz of water.  At baseline the patient is on a regular diet.  Limited cranial nerve exam was completed. Lingual, labial, facial and jaw range of motion appeared adequate.  Strength and sensation was not assessed.  He was presented with thin liquids via spoon and straw, pureed material and dry solids.  He was able to self feed for this evaluation.  His oropharyngeal swallow appeared adequate as best as can be  told at the bedside.  Mastication of dry solids appeared adequate.  Swallow trigger was appreciated to palpation and overt s/s of aspiration were not seen.  Suggest a regular textured diet with full range of liquids.  ST will followin briefly for therapeutic diet tolerance. SLP Visit Diagnosis: Dysphagia, unspecified (R13.10)    Aspiration Risk  No limitations    Diet Recommendation    Regular/thin       Other Recommendations Oral Care Recommendations: Oral care BID     Swallow Evaluation Recommendations Recommendations: PO diet PO Diet Recommendation: Regular;Thin liquids (Level 0) Liquid Administration via: Cup;Straw Medication Administration: Whole meds with liquid Supervision: Patient able to self-feed Postural changes: Position pt fully upright for meals Oral care recommendations: Oral care BID (2x/day)      Functional Status Assessment Patient has had a recent decline in their functional status and demonstrates the ability to make significant improvements in function in a reasonable and predictable amount of time.  Frequency and Duration min 1 x/week  2 weeks       Prognosis Prognosis for improved oropharyngeal function: Good      Swallow Study   General Date of Onset: 07/10/24 HPI: Juan [name] is a 89 year old male with extensive history of DM II, HTN, HLD, CAD, S/P CABG, CKD, chronic hearing loss, GERD, gout, CVA/TIAs.  He presented to ED with chief complaint of acute onset of slurred speech, drooling, bilateral lower extremity weakness, at the facility as he was trying to have his lunch with his daughter.  Patient's symptoms started about 1230 this afternoon but progressively been improving.   Patient had communication/cognitive evaluation May 2022 with findings of dysarthria that was baseline.  CT head was showing no acute or evolving infarct. Type of Study: Bedside Swallow Evaluation Previous Swallow Assessment: No swallow eval noted at [facility]. Diet Prior to this Study:  NPO Temperature Spikes Noted: No Respiratory Status: Room air History of Recent Intubation: No Behavior/Cognition: Alert;Cooperative Oral Cavity Assessment: Within Functional Limits Oral Care Completed by SLP: No Oral Cavity - Dentition: Adequate natural dentition Vision: Functional for self-feeding Self-Feeding Abilities: Able to feed self Patient Positioning: Upright in chair Baseline Vocal Quality: Normal Volitional Swallow: Unable to elicit    Oral/Motor/Sensory Function Overall Oral Motor/Sensory Function: Within functional limits   Ice Chips Ice chips: Not tested   Thin Liquid Thin Liquid: Within functional limits Presentation: Spoon;Straw;Self Fed    Nectar Thick Nectar Thick Liquid: Not tested   Honey Thick Honey Thick Liquid: Not tested   Puree Puree: Within functional limits Presentation: Spoon;Self Fed   Solid     Solid: Within functional limits Presentation: Self Fed     Eleanor Eagles, MA, CCC-SLP Acute Rehab SLP [phone] Eleanor LOISE Eagles 07/11/2024,10:19 AM

## 2024-07-11 NOTE — Procedures (Addendum)
 Patient Name: Juan [name]  MRN: [id number]  Epilepsy Attending: Arlin MALVA Krebs  Referring Physician/Provider: Remi Pippin, NP  Date: 07/10/2024 Duration: 28.12 mins  Patient history: 89yo M with slurred speech, drooling, bilateral lower extremity weakness. EEG to evaluate for seizure   Level of alertness: Awake  AEDs during EEG study: None  Technical aspects: This EEG study was done with scalp electrodes positioned according to the 10-20 International system of electrode placement. Electrical activity was reviewed with band pass filter of 1-70Hz , sensitivity of 7 uV/mm, display speed of 20mm/sec with a 60Hz  notched filter applied as appropriate. EEG data were recorded continuously and digitally stored.  Video monitoring was available and reviewed as appropriate.  Description: The posterior dominant rhythm consists of 9 Hz activity of moderate voltage (25-35 uV) seen predominantly in posterior head regions, symmetric and reactive to eye opening and eye closing. EEG showed intermittent 5 to 6 Hz theta slowing in ;left hemisphere. Hyperventilation and photic stimulation were not performed.     ABNORMALITY - Intermittent slow, left hemisphere  IMPRESSION: This study is suggestive of non specific cortical dysfunction arising from left hemisphere. No seizures or epileptiform discharges were seen throughout the recording.  Juan [name] Juan [name]

## 2024-07-11 NOTE — Progress Notes (Addendum)
 STROKE TEAM PROGRESS NOTE    INTERIM HISTORY/SUBJECTIVE He was sitting in the chair in no apparent distress.  Daughter is at the bedside as well as daughter on the phone.  Dr.Graclyn Lawther updated both of them and all questions were asked EEG was obtained with no seizures..  Patient had presented with slurred speech and drooling from the mouth which appears to have resolved.  CT angiogram shows severe atheromatous irregularity of left MCA branches with focal left M2 superior division branch stenosis and severe distal left M2 branch stenosis.  There is also severe stenosis of the right PCA and left A2 and right vertebral artery origin.  Patient is not able to have an MRI due to cochlear implant CBC    Component Value Date/Time   WBC 6.2 07/11/2024 0758   RBC 4.07 (L) 07/11/2024 0758   HGB 12.1 (L) 07/11/2024 0758   HCT 36.3 (L) 07/11/2024 0758   PLT 335 07/11/2024 0758   MCV 89.2 07/11/2024 0758   MCH 29.7 07/11/2024 0758   MCHC 33.3 07/11/2024 0758   RDW 13.9 07/11/2024 0758   LYMPHSABS 1.6 07/10/2024 1434   MONOABS 0.5 07/10/2024 1434   EOSABS 0.2 07/10/2024 1434   BASOSABS 0.0 07/10/2024 1434    BMET    Component Value Date/Time   NA 142 07/11/2024 0758   NA 141 01/13/2024 0000   K 3.5 07/11/2024 0758   CL 107 07/11/2024 0758   CO2 24 07/11/2024 0758   GLUCOSE 102 (H) 07/11/2024 0758   GLUCOSE 127 (H) 05/16/2006 1101   BUN 21 07/11/2024 0758   BUN 24 (A) 01/13/2024 0000   CREATININE 1.12 07/11/2024 0758   CREATININE 1.38 (H) 01/19/2020 1030   CALCIUM 9.9 07/11/2024 0758   EGFR 48 01/13/2024 0000   GFRNONAA >60 07/11/2024 0758   GFRNONAA 48 (L) 01/19/2020 1030    IMAGING past 24 hours EEG adult Result Date: 07/11/2024 Juan Juan KIDD, MD     07/11/2024  8:21 AM Patient Name: [name] MRN: [id number] Epilepsy Attending: Arlin [name] Juan Referring Physician/Provider: Remi Pippin, NP Date: 07/10/2024 Duration: 28.12 mins Patient history: 89yo M with slurred speech, drooling,  bilateral lower extremity weakness. EEG to evaluate for seizure Level of alertness: Awake AEDs during EEG study: None Technical aspects: This EEG study was done with scalp electrodes positioned according to the 10-20 International system of electrode placement. Electrical activity was reviewed with band pass filter of 1-70Hz , sensitivity of 7 uV/mm, display speed of 62mm/sec with a 60Hz  notched filter applied as appropriate. EEG data were recorded continuously and digitally stored.  Video monitoring was available and reviewed as appropriate. Description: The posterior dominant rhythm consists of 9 Hz activity of moderate voltage (25-35 uV) seen predominantly in posterior head regions, symmetric and reactive to eye opening and eye closing. EEG showed intermittent 5 to 6 Hz theta slowing in ;left hemisphere. Hyperventilation and photic stimulation were not performed.   ABNORMALITY - Intermittent slow, left hemisphere IMPRESSION: This study is suggestive of non specific cortical dysfunction arising from left hemisphere. No seizures or epileptiform discharges were seen throughout the recording. Juan [name] Juan   CT HEAD WO CONTRAST ( ) Result Date: 07/11/2024 EXAM: CT HEAD WITHOUT CONTRAST 07/11/2024 05:16:57 AM TECHNIQUE: CT of the head was performed without the administration of intravenous contrast. Automated exposure control, iterative reconstruction, and/or weight based adjustment of the mA/kV was utilized to reduce the radiation dose to as low as reasonably achievable. COMPARISON: CT head from 07/10/2024 and earlier. CLINICAL  HISTORY: 89 year old male. Stroke, follow up. Hospitalized for code stroke presentation. Severe left middle cerebral artery branch stenosis or short segment occlusion on computed tomography angiography. Other severe intracranial atherosclerosis and stenosis. FINDINGS: BRAIN AND VENTRICLES: No acute hemorrhage. No hydrocephalus. No extra-axial collection. No mass effect or midline shift.  Stable brain volume. Asymmetric heterogeneity in the bilateral cerebral white matter, bilateral deep gray nuclei. Stable gray white differentiation. Advanced calcified atherosclerosis at the skull base. ORBITS: No gaze deviation. SINUSES: Tympanic cavities, mastoids and paranasal sinuses remain well aerated. SOFT TISSUES AND SKULL: Right side cochlear implant redemonstrated with streak artifact. No adverse features. No acute soft tissue abnormality. No skull fracture. IMPRESSION: 1. No acute or evolving infarct identified by CT. No acute intracranial abnormality. Electronically signed by: Helayne Hurst MD 07/11/2024 05:27 AM EST RP Workstation: [id number]   CT ANGIO HEAD NECK W WO CM (CODE STROKE) Result Date: 07/10/2024 EXAM: CTA HEAD AND NECK WITH AND WITHOUT 07/10/2024 02:24:23 PM TECHNIQUE: CTA of the head and neck was performed with and without the administration of intravenous contrast. 75 mL of iohexol  (OMNIPAQUE ) 350 MG/ML injection was administered. Multiplanar 2D and/or 3D reformatted images are provided for review. Automated exposure control, iterative reconstruction, and/or weight based adjustment of the mA/kV was utilized to reduce the radiation dose to as low as reasonably achievable. Stenosis of the internal carotid arteries measured using NASCET criteria. COMPARISON: Same day CT head as well as CTA head and neck 10/21/2020. CLINICAL HISTORY: Neuro deficit, acute, stroke suspected. Acute neurological deficit, stroke suspected. FINDINGS: CTA NECK: AORTIC ARCH AND ARCH VESSELS: Mild atherosclerosis of the aortic arch. Atherosclerosis of the proximal subclavian arteries without significant stenosis. No dissection or arterial injury. CERVICAL CAROTID ARTERIES: Mild atherosclerosis at the right carotid bifurcation without hemodynamically significant stenosis. Mild atherosclerosis at the left carotid bifurcation without hemodynamically significant stenosis. Partial retropharyngeal course of the proximal  left cervical ICA. Tortuosity of the left cervical ICA. No dissection or arterial injury. CERVICAL VERTEBRAL ARTERIES: Atherosclerosis at the right vertebral artery origin results in severe stenosis. Mild tortuosity of the right V1 segment. Atherosclerosis at the left vertebral artery origin resulting in mild stenosis. Tortuosity of the left V1 segment. No dissection or arterial injury. LUNGS AND MEDIASTINUM: Unremarkable. SOFT TISSUES: No acute abnormality. BONES: Sequelae of median sternotomy. Degenerative changes in the visualized spine. CTA HEAD: ANTERIOR CIRCULATION: There is sclerosis in the bilateral carotid siphons. There is mild stenosis of the right supraclinoid ICA. The anterior cerebral arteries are patent bilaterally. Moderate stenosis of the distal A2 segment of the left ACA. The M1 segment of the left MCA is patent. Proximal M2 branches of the left MCA are patent. There is atherosclerotic irregularity of multiple left MCA branches. Focal severe stenosis of an M2 superior division branch of the left MCA (series 7, image 82). Additional severe stenosis of an M2 superior division branch of the left MCA (series 7, image 82). Additional severe stenosis versus short segment occlusion of a distal M2 branch of the left MCA (series 7, image 97 and series 11, image 146). There is severe stenosis versus short segment occlusion of an M4 branch of the left MCA over the inferior aspect of the parietal lobe (series 7, image 88). The right MCA is patent. Evaluation is somewhat limited due to streak artifact. Within these limitations, there are areas of mild atherosclerotic irregularity of right MCA branches without evidence of severe stenosis or occlusion. No aneurysm. POSTERIOR CIRCULATION: Severe stenosis of the P3 segment of the right  PCA. Moderate stenosis of the mid P2 segment of the left PCA. No significant stenosis of the basilar artery. No significant stenosis of the vertebral arteries. No aneurysm.  IMPRESSION: 1. Atherosclerotic irregularity of multiple left MCA branches. Focal severe stenosis of an M2 superior division branch. Additional severe stenosis versus short segment occlusion of a distal M2 branch. Severe stenosis versus short segment occlusion of an M4 branch over the inferior left parietal lobe. Findings are new since the 2022 CTA. 2. Severe stenosis of the P3 segment of the right PCA and moderate stenosis of the mid P2 segment of the left PCA, increased from prior. 3. Moderate stenosis of the distal A2 segment of the left ACA, similar to prior. 4. Severe stenosis at the right vertebral artery origin, similar to prior. 5. Impression 1 messaged to Dr. Voncile via the [facility] messaging system at 2:37 PM on 07/10/24. Electronically signed by: Donnice Mania MD 07/10/2024 02:53 PM EST RP Workstation: [id number]   CT HEAD CODE STROKE WO CONTRAST Result Date: 07/10/2024 EXAM: CT HEAD WITHOUT CONTRAST 07/10/2024 02:23:58 PM TECHNIQUE: CT of the head was performed without the administration of intravenous contrast. Automated exposure control, iterative reconstruction, and/or weight based adjustment of the mA/kV was utilized to reduce the radiation dose to as low as reasonably achievable. COMPARISON: CT head 02/03/2023. CLINICAL HISTORY: Acute neurological deficit, stroke suspected. FINDINGS: BRAIN AND VENTRICLES: Right sided cochlear implant noted with associated streak artifact which slightly limits evaluation particularly along the right temporal and occipital lobes. Mild chronic microvascular ischemic changes. Mild generalized parenchymal volume loss. 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 Atherosclerosis involving the carotid siphons and intracranial vertebral arteries. No acute hemorrhage. No evidence of acute infarct. No hydrocephalus. No extra-axial collection. No mass effect or midline shift. ORBITS: Right lens replacement. SINUSES: No acute abnormality. SOFT TISSUES AND SKULL: Right sided cochlear  implant noted. No skull fracture. [place] stroke program early CT (ASPECT) score: Ganglionic (caudate, internal capsule, lentiform nucleus, insula, M1-M3): 7 Supraganglionic (M4-M6): 3 Total: 10 IMPRESSION: 1. No acute intracranial abnormality. 2. ASPECTS is 10. 3. 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 4. Evaluation is mildly limited by streak artifact from a right cochlear implant. 5. Findings messaged to Dr. Arora via the [facility] messaging system at 2:37 PM on 07/10/24. Electronically signed by: Donnice Mania MD 07/10/2024 02:38 PM EST RP Workstation: [id number]    Vitals:   07/10/24 2205 07/11/24 0359 07/11/24 0739 07/11/24 1145  BP: (!) 117/55 (!) 157/84 (!) 164/80 (!) 161/90  Pulse: 87 75 86 92  Resp: 14 18 17 18   Temp: 98.3 F (36.8 C) (!) 97.5 F (36.4 C) 97.7 F (36.5 C) 97.8 F (36.6 C)  TempSrc: Oral Oral Oral Oral  SpO2: 94% 98% 97% 99%  Weight:         PHYSICAL EXAM General:  Alert, well-nourished, well-developed patient in no acute distress Psych:  Mood and affect appropriate for situation CV: Regular rate and rhythm on monitor Respiratory:  Regular, unlabored respirations on room air GI: Abdomen soft and nontender   NEURO:  Mental Status: AA&Ox3, patient is able to give clear and coherent history Speech/Language: speech is raspy with mild dysarthria which is his baseline.  Naming, repetition, fluency, and comprehension intact.  Cranial Nerves:  II: PERRL. Visual fields full.  III, IV, VI: EOMI. Eyelids elevate symmetrically.  V: Sensation is intact to light touch and symmetrical to face.  VII: Face is symmetrical resting and smiling VIII: hearing intact to voice. IX, X:  Palate elevates symmetrically. Phonation is normal.  KP:Dynloizm shrug 5/5. XII: tongue is midline without fasciculations. Motor: 5/5 strength to all muscle groups tested.  Tone: is normal and bulk is normal Sensation- Intact to light touch bilaterally. Extinction absent to light touch to DSS.    Coordination: FTN intact bilaterally, HKS: no ataxia in BLE.No drift.  Gait- deferred   ASSESSMENT/PLAN  Mr. Juan [name] is a 89 y.o. male with history of AD s/p CABG, HTN, HLD, HTN, hearing loss s/p cochlear impilant presenting with an acute onset of slurred speech, drooling, and bilateral leg weakness.  We will load with aspirin  650 mg and start aspirin  81 daily in addition to his home dose of Plavix  NIH on Admission 2  Left hemispheric TIA versus small infarct not visualized on CT scan x 2.:  Patient cannot have an MRI due to cochlear implant Etiology: Likely from from intracranial stenosis Code Stroke CT head No acute abnormality. ASPECTS 10. 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    CTA head & neck: 1. Atherosclerotic irregularity of multiple left MCA branches. Focal severe stenosis of an M2 superior division branch. Additional severe stenosis versus short segment occlusion of a distal M2 branch. Severe stenosis versus short segment occlusion of an M4 branch over the inferior left parietal lobe. Findings are new since the 2022 CTA. 2. Severe stenosis of the P3 segment of the right PCA and moderate stenosis of the mid P2 segment of the left PCA, increased from prior. 3. Moderate stenosis of the distal A2 segment of the left ACA, similar to prior. 4. Severe stenosis at the right vertebral artery origin, similar to prior. MRI unable to have due to cochlear implant Repeat CT head with no acute process 2D Echo EF 55 to 60% LV with grade 1 diastolic dysfunction LDL 85 HgbA1c 8.2 EEG  Intermittent slow, left hemisphere  VTE prophylaxis -SCDs clopidogrel  75 mg daily prior to admission, now on aspirin  81 mg daily and Brilinta  (ticagrelor ) 90 mg bid for 3 months and then aspirin  alone. Therapy recommendations:  Pending Disposition: Pending  Hx of Stroke/TIA Remote infarct in the right basal ganglia.     Hypertension CAD Home meds: Lisinopril  20 mg Stable Blood Pressure Goal:  SBP less than 160   Hyperlipidemia Home meds: Zetia  10 mg, fenofibrate  135 mg, resumed in hospital LDL 85, goal < 70 Allergy to statin Continue statin at discharge  Diabetes type II UnControlled Home meds: Metformin  HgbA1c 8.2, goal < 7.0 CBGs SSI Recommend close follow-up with PCP for better DM control  Dysphagia Patient has post-stroke dysphagia, SLP consulted    Diet   Diet regular Room service appropriate? Yes; Fluid consistency: Thin   Advance diet as tolerated  Other Stroke Risk Factors Coronary artery disease Congestive heart failure   Other Active Problems BPH Gout Hard of hearing-s/p cochlear device implant Vitamin B12 and D deficiency CKD  Hospital day # 0 I have personally obtained history,examined this patient, reviewed notes, independently viewed imaging studies, participated in medical decision making and plan of care.ROS completed by me personally and pertinent positives fully documented  I have made any additions or clarifications directly to the above note. Agree with note above.  Patient presented with sudden onset of speech difficulties and likely expressive aphasia which appears to have resolved.  CT scan x 2 is negative for acute stroke.  He does have severe intracranial stenosis especially in the left MCA branches and MRI is not possible due to cochlear implant.  Since patient was on Plavix  prior to admission recommend switching to dual antiplatelet therapy aspirin  and Brilinta  for 3 months and then Brilinta  alone if patient can afford it otherwise aspirin .  Aggressive risk factor modification.  Long discussion at the bedside with the and daughter and answered.  Discussed with Dr. Madelyne.  Follow-up as an outpatient stroke clinic with nurse practitioner in 2 months   I personally spent a total of 50 minutes in the care of the patient today including getting/reviewing separately obtained history, performing a medically appropriate exam/evaluation,  counseling and educating, placing orders, referring and communicating with other health care professionals, documenting clinical information in the EHR, independently interpreting results, and coordinating care.        Eather Popp, MD Medical Director [facility] Pager: [phone] 07/11/2024 3:03 PM    To contact Stroke Continuity provider, please refer to [email or web address]. After hours, contact General Neurology

## 2024-07-14 ENCOUNTER — Non-Acute Institutional Stay (SKILLED_NURSING_FACILITY): Payer: Self-pay | Admitting: Nurse Practitioner

## 2024-07-14 ENCOUNTER — Encounter: Payer: Self-pay | Admitting: Nurse Practitioner

## 2024-07-14 DIAGNOSIS — N1831 Chronic kidney disease, stage 3a: Secondary | ICD-10-CM | POA: Diagnosis not present

## 2024-07-14 DIAGNOSIS — I639 Cerebral infarction, unspecified: Secondary | ICD-10-CM

## 2024-07-14 DIAGNOSIS — Z7984 Long term (current) use of oral hypoglycemic drugs: Secondary | ICD-10-CM | POA: Diagnosis not present

## 2024-07-14 DIAGNOSIS — Z8739 Personal history of other diseases of the musculoskeletal system and connective tissue: Secondary | ICD-10-CM | POA: Diagnosis not present

## 2024-07-14 DIAGNOSIS — N138 Other obstructive and reflux uropathy: Secondary | ICD-10-CM

## 2024-07-14 DIAGNOSIS — E1122 Type 2 diabetes mellitus with diabetic chronic kidney disease: Secondary | ICD-10-CM | POA: Diagnosis not present

## 2024-07-14 DIAGNOSIS — I10 Essential (primary) hypertension: Secondary | ICD-10-CM | POA: Diagnosis not present

## 2024-07-14 DIAGNOSIS — N401 Enlarged prostate with lower urinary tract symptoms: Secondary | ICD-10-CM | POA: Diagnosis not present

## 2024-07-14 NOTE — Assessment & Plan Note (Signed)
 on Lisinopril .

## 2024-07-14 NOTE — Assessment & Plan Note (Signed)
 s/p CABG, takes Zetia , dc'd Plavix  since placed on ASA/Brilinta  for 3 months, then per Cardiology.

## 2024-07-14 NOTE — Assessment & Plan Note (Signed)
 urinary frequency, on Tamsulossin, Finasteride .

## 2024-07-14 NOTE — Progress Notes (Signed)
 This encounter was created in error - please disregard.

## 2024-07-14 NOTE — Assessment & Plan Note (Signed)
 Hospitalized 07/10/24-07/11/24 for sudden onset of slurred speech, drooling, BLE weakness(resolved) CT head negative for acute intracranial abnormality, not a candidate for TNK.   started Brilinta , ASA for 3months, then per Cardiology.

## 2024-07-14 NOTE — Assessment & Plan Note (Signed)
 Stable taking Allopurinol , Colchicine 

## 2024-07-14 NOTE — Assessment & Plan Note (Signed)
 paused Metformin , Hgb A1c 6.7 07/11/24 Bun/creat 21/1.12 07/11/24 Fasting CBG 2x/week, Hgb A1c 3 months.

## 2024-07-14 NOTE — Progress Notes (Unsigned)
 " Location:  [facility] Room Number: N11-A Place of Service:  SNF (31) Provider:  Aureliano Oshields X, NP    Charlanne Fredia CROME, MD  Patient Care Team: Charlanne Fredia CROME, MD as PCP - General (Internal Medicine) Delford Maude BROCKS, MD as PCP - Cardiology (Cardiology)  Extended Emergency Contact Information Primary Emergency Contact: [name],[name] Address: [address]          [place], [place] [address] [place] Home Phone: [phone] Mobile Phone: [phone] Relation: Daughter Secondary Emergency Contact: [name],[name]          [place], [place] Home Phone: [phone] Mobile Phone: [phone] Relation: Daughter  Code Status:  DNR Goals of care: Advanced Directive information    07/14/2024    3:45 PM  Advanced Directives  Does Patient Have a Medical Advance Directive? Yes  Type of Estate Agent of [place];Living will;Out of facility DNR (pink MOST or yellow form)  Does patient want to make changes to medical advance directive? No - Patient declined  Copy of Healthcare Power of Attorney in Chart? Yes - validated most recent copy scanned in chart (See row information)  Pre-existing out of facility DNR order (yellow form or pink MOST form) Yellow form placed in chart (order not valid for inpatient use)     Chief Complaint  Patient presents with   Hospitalization Follow-up    Hospital-follow-up    HPI:  Pt is a 89 y.o. male seen today for a hospital f/u   Hospitalized 07/10/24-07/11/24 for sudden onset of slurred speech, drooling, BLE weakness,, CT head negative for acute intracranial abnormality, not a candidate for TNK. He was discharged on Keflex , no urine culture result from hospital, asymptomatic presently.   Stroke, started Brilinta , ASA for 3months, then per Cardiology.     BPH, urinary frequency, on Tamsulossin, Finasteride .              HTN, on Lisinopril .              T2DM paused Metformin , Hgb  A1c 6.7 07/11/24             TIA on ASA and Brilinta               CAD s/p CABG, takes Zetia , dc'd Plavix  since placed on ASA/Brilinta  for 3 months, then per Cardiology.              HLD, on Zetia , Fenofibrate , LDL 85 07/11/24             CKD Bun/creat 21/1.12 07/11/24             Hx of Gout, taking Allopurinol , Colchicine   Past Medical History:  Diagnosis Date   Cancer (HCC)    skin   CKD (chronic kidney disease) 06/24/2013   Cochlear implant in place    Coronary artery disease    a. s/p CABG 2000; b. myoview 9/08: inf-septal scar with mild peri-infarct ischemia (low risk);  c.  Echo 8/13: mild focal basal septal hypertrophy, Gr 1 DD, mild LAE;  d. Lexiscan  Myoview (06/20/2013): No reversible ischemia, inferior and septal infarct, inferior and septal hypokinesis, EF 60%;  e.  Echo (06/20/2011): EF 50-55%, normal wall motion, grade 1 diastolic dysfunction, mild LAE   Diverticular disease    Duodenitis    Elevated PSA    GERD (gastroesophageal reflux disease)    Gout    Humerus fracture    right   Hydronephrosis with ureteropelvic junction obstruction  Hypercalcemia 02/02/2012   a. 01/2012: 10.7.   Hyperlipidemia    Hypertension    Pancreatitis    a. ? Simvastatin  related   Pneumonia age 24 or 7   I think only once (06/19/2013)   Syncope    a. 06/2013   TIA (transient ischemic attack)    a. 01/2012 - presentation as acute imbalance;  b. 01/2012 carotid U/S w/o signif stenosis;  c. 01/2012 Echo: EF 60-65%, Gr 1 DD.;  d.  Carotid US  (06/20/2013): 1-39% bilateral ICA stenosis   Tubular adenoma polyp of rectum    Type II diabetes mellitus (HCC)    diet and exercise controlled at present (06/19/2013)   Past Surgical History:  Procedure Laterality Date   APPENDECTOMY     CARDIAC CATHETERIZATION  06/18/1998   CHOLECYSTECTOMY     COCHLEAR IMPLANT Right 02/16/1989   COLONOSCOPY W/ POLYPECTOMY     no polyp 2012   CORONARY ARTERY BYPASS GRAFT  06/18/1998   CABG X5    INGUINAL HERNIA REPAIR Right 09/15/2015   Procedure: OPEN REPAIR RIGHT INGUINAL HERNIA ;  Surgeon: Krystal Russell, MD;  Location: [facility];  Service: General;  Laterality: Right;   INSERTION OF MESH Right 09/15/2015   Procedure: INSERTION OF MESH;  Surgeon: Krystal Russell, MD;  Location: [facility];  Service: General;  Laterality: Right;   PARATHYROIDECTOMY  06/18/1976   POLYPECTOMY     REVERSE SHOULDER ARTHROPLASTY Right 12/30/2014   Procedure: Right shoulder ORIF, Biomet S3 Plate and ZImmer cable system ;  Surgeon: Marcey Her, MD;  Location: [facility] OR;  Service: Orthopedics;  Laterality: Right;   right ankle surgery  age 42   TONSILLECTOMY  06/18/1941   trachestomy as child for throat closing      Allergies[1]  Outpatient Encounter Medications as of 07/14/2024  Medication Sig   acetaminophen  (TYLENOL ) 500 MG tablet Take 1,000 mg by mouth 2 (two) times daily.   allopurinol  (ZYLOPRIM ) 100 MG tablet Take 100 mg by mouth daily.   aspirin  81 MG chewable tablet Chew 1 tablet (81 mg total) by mouth daily.   cholecalciferol  (VITAMIN D3) 25 MCG (1000 UNIT) tablet Take 1,000 Units by mouth daily.   Choline Fenofibrate  (FENOFIBRIC ACID ) 135 MG CPDR Take 1 tablet by mouth in the morning.   colchicine  0.6 MG tablet Take 0.6 mg by mouth daily as needed (as directed for gout flares).   ezetimibe  (ZETIA ) 10 MG tablet Take 10 mg by mouth daily.   finasteride  (PROSCAR ) 5 MG tablet Take 5 mg by mouth every evening.   guaiFENesin -dextromethorphan (ROBITUSSIN DM) 100-10 MG/5ML syrup Take 10 mLs by mouth every 6 (six) hours as needed for cough.   lisinopril  (ZESTRIL ) 20 MG tablet Take 20 mg by mouth daily.   metFORMIN  (GLUCOPHAGE ) 500 MG tablet Take 1,000 mg by mouth every morning.   metFORMIN  HCl 750 MG TABS Take 750 mg by mouth every evening.   miconazole (MICOTIN) 2 % powder Apply topically daily.   polyethylene glycol powder (GLYCOLAX /MIRALAX ) 17 GM/SCOOP powder Take 17 g by mouth 2 (two)  times a week. (Patient taking differently: Take 17 g by mouth 2 (two) times a week. On Mondays and Thursdays)   tamsulosin  (FLOMAX ) 0.4 MG CAPS capsule Take 0.4 mg by mouth at bedtime.   ticagrelor  (BRILINTA ) 90 MG TABS tablet Take 1 tablet (90 mg total) by mouth 2 (two) times daily.   zinc oxide 20 % ointment Apply 1 Application topically as needed for irritation.   No facility-administered  encounter medications on file as of 07/14/2024.    Review of Systems  Constitutional:  Negative for appetite change, fatigue and fever.  HENT:  Positive for hearing loss and rhinorrhea. Negative for congestion and trouble swallowing.        R cochlear implant  Eyes:  Negative for visual disturbance.  Respiratory:  Negative for cough, shortness of breath and wheezing.   Cardiovascular:  Negative for leg swelling.  Gastrointestinal:  Negative for abdominal pain and constipation.  Genitourinary:  Positive for frequency. Negative for dysuria, hematuria and urgency.       1-2x/night.   Musculoskeletal:  Positive for arthralgias and gait problem. Negative for back pain.  Skin:  Negative for color change.  Neurological:  Negative for speech difficulty, weakness and headaches.       Memory lapses.   Psychiatric/Behavioral:  Positive for confusion. Negative for sleep disturbance. The patient is not nervous/anxious.     Immunization History  Administered Date(s) Administered   Fluad Quad(high Dose 65+) 04/01/2019, 04/11/2022   INFLUENZA, HIGH DOSE SEASONAL PF 04/17/2013, 03/16/2014, 03/05/2016, 03/18/2017, 04/17/2023   Influenza Split 04/13/2011, 05/02/2012   Influenza Whole 05/22/2007, 04/12/2008, 05/02/2009, 04/14/2010   Influenza,inj,Quad PF,6+ Mos 03/02/2015, 03/18/2018   Influenza-Unspecified 03/18/2018, 04/12/2021, 03/24/2024   Moderna Covid-19 Fall Seasonal Vaccine 26yrs & older 04/24/2023, 10/09/2023   Moderna Covid-19 Vaccine Bivalent Booster 43yrs & up 11/29/2021   Moderna SARS-COV2  Booster Vaccination 03/08/2021   Moderna Sars-Covid-2 Vaccination 06/22/2019, 07/20/2019, 05/02/2020, 11/23/2020   PNEUMOCOCCAL CONJUGATE-20 08/29/2023   PPD Test 01/02/2015   Pneumococcal Conjugate-13 12/29/2015   Pneumococcal Polysaccharide-23 04/19/2006   Respiratory Syncytial Virus Vaccine,Recomb Aduvanted(Arexvy) 05/14/2022   Tdap 09/04/2016   Unspecified SARS-COV-2 Vaccination 04/24/2022, 04/14/2024   Zoster Recombinant(Shingrix) 09/17/2019, 03/29/2020   Zoster, Live 06/29/2015   Pertinent  Health Maintenance Due  Topic Date Due   FOOT EXAM  08/15/2024   OPHTHALMOLOGY EXAM  11/27/2024   HEMOGLOBIN A1C  01/08/2025   Influenza Vaccine  Completed      03/20/2023   12:07 PM 05/27/2023   11:29 AM 08/16/2023    2:02 PM 12/09/2023   11:32 AM 03/30/2024   11:04 AM  Fall Risk  Falls in the past year? 1 1 1 1 1   Was there an injury with Fall? 1  0  1  1  1    Fall Risk Category Calculator 3 2 3 3 3   Patient at Risk for Falls Due to History of fall(s);Impaired balance/gait;Impaired mobility History of fall(s);Impaired balance/gait;Impaired mobility History of fall(s);Impaired balance/gait;Impaired mobility History of fall(s);Impaired balance/gait;Impaired mobility History of fall(s);Impaired balance/gait;Impaired mobility  Fall risk Follow up Education provided;Falls prevention discussed Falls evaluation completed;Education provided;Falls prevention discussed Falls evaluation completed;Education provided Falls evaluation completed;Education provided Falls evaluation completed     Data saved with a previous flowsheet row definition   Functional Status Survey:    Vitals:   07/14/24 1537  BP: 138/78  Pulse: 84  Resp: 17  Temp: 97.6 F (36.4 C)  SpO2: 95%  Weight: 164 lb (74.4 kg)  Height: 5' 9 (1.753 m)   Body mass index is 24.22 kg/m. Physical Exam Vitals and nursing note reviewed.  Constitutional:      Comments: sleepy  HENT:     Head: Normocephalic and  atraumatic.     Nose: Nose normal.     Mouth/Throat:     Mouth: Mucous membranes are moist.  Eyes:     Extraocular Movements: Extraocular movements intact.     Conjunctiva/sclera: Conjunctivae normal.  Pupils: Pupils are equal, round, and reactive to light.  Cardiovascular:     Rate and Rhythm: Normal rate and regular rhythm.     Heart sounds: No murmur heard. Pulmonary:     Effort: Pulmonary effort is normal.     Breath sounds: No rales.  Abdominal:     General: Bowel sounds are normal.     Palpations: Abdomen is soft.     Tenderness: There is no abdominal tenderness. There is no right CVA tenderness, left CVA tenderness, guarding or rebound.  Musculoskeletal:        General: Tenderness present. Normal range of motion.     Cervical back: Normal range of motion and neck supple.     Right lower leg: No edema.     Left lower leg: No edema.  Skin:    General: Skin is warm and dry.     Findings: Bruising present.     Comments: Bruise/abrasion upper lip, bruise right forearm, R+L knee, incidental finding: left leg is slightly thicker than the right, no apparent edema.   Neurological:     General: No focal deficit present.     Mental Status: He is alert and oriented to person, place, and time. Mental status is at baseline.     Gait: Gait abnormal.  Psychiatric:        Mood and Affect: Mood normal.     Comments: restlessness     Labs reviewed: Recent Labs    01/13/24 0000 01/23/24 0000 07/10/24 1433 07/10/24 1434 07/10/24 1929 07/11/24 0758  NA 141  --  141 137  --  142  K 4.2  --  4.1 4.1  --  3.5  CL 108  --  106 103  --  107  CO2 24*  --   --  22  --  24  GLUCOSE  --   --  171* 175*  --  102*  BUN 24*  --  36* 28*  --  21  CREATININE 1.4*  --  1.40* 1.28*  --  1.12  CALCIUM 10.4 10.5  --  10.6*  --  9.9  MG  --   --   --   --  1.5*  --   PHOS  --   --   --   --  2.6  --    Recent Labs    07/10/24 1434  AST 36  ALT 15  ALKPHOS 48  BILITOT 0.3  PROT 6.7   ALBUMIN 3.9   Recent Labs    07/10/24 1434 07/10/24 1929 07/11/24 0758  WBC 6.7 7.0 6.2  NEUTROABS 4.2  --   --   HGB 12.3* 11.6* 12.1*  HCT 37.5* 34.7* 36.3*  MCV 91.7 88.1 89.2  PLT 327 352 335   Lab Results  Component Value Date   TSH 1.19 01/13/2024   Lab Results  Component Value Date   HGBA1C 6.7 (H) 07/11/2024   Lab Results  Component Value Date   CHOL 155 07/11/2024   HDL 29 (L) 07/11/2024   LDLCALC 85 07/11/2024   LDLDIRECT 96.0 08/23/2020   TRIG 206 (H) 07/11/2024   CHOLHDL 5.4 07/11/2024    Significant Diagnostic Results in last 30 days:  ECHOCARDIOGRAM COMPLETE Result Date: 07/11/2024    ECHOCARDIOGRAM REPORT   Patient Name:   GORGE ALMANZA Date of Exam: 07/11/2024 Medical Rec #:  [id number]    Height:       69.0 in Accession #:    [id number]  Weight:       165.3 lb Date of Birth:  01/01/1936    BSA:          1.906 m Patient Age:    88 years     BP:           164/80 mmHg Patient Gender: M            HR:           76 bpm. Exam Location:  Inpatient Procedure: 2D Echo, Cardiac Doppler and Color Doppler (Both Spectral and Color            Flow Doppler were utilized during procedure). Indications:    Stroke I63.9  History:        Patient has prior history of Echocardiogram examinations, most                 recent 10/21/2020. Arrythmias:PAC; Signs/Symptoms:Hypertensive                 Heart Disease.  Sonographer:    Nathanel Devonshire Referring Phys: [id number] DEVON SHAFER IMPRESSIONS  1. Left ventricular ejection fraction, by estimation, is 55 to 60%. The left ventricle has normal function. The left ventricle has no regional wall motion abnormalities. Left ventricular diastolic parameters are consistent with Grade I diastolic dysfunction (impaired relaxation).  2. Right ventricular systolic function is normal. The right ventricular size is normal. Tricuspid regurgitation signal is inadequate for assessing PA pressure.  3. The mitral valve is normal in structure. Trivial mitral valve  regurgitation. No evidence of mitral stenosis.  4. The aortic valve is normal in structure. There is mild calcification of the aortic valve. There is mild thickening of the aortic valve. Aortic valve regurgitation is not visualized. Aortic valve sclerosis/calcification is present, without any evidence of aortic stenosis.  5. The inferior vena cava is normal in size with greater than 50% respiratory variability, suggesting right atrial pressure of 3 mmHg. Comparison(s): Prior images reviewed side by side. The left ventricular diastolic function is slightly worse, but findings may be partly due to low left atrial pressure (relative hypovolemia). FINDINGS  Left Ventricle: Left ventricular ejection fraction, by estimation, is 55 to 60%. The left ventricle has normal function. The left ventricle has no regional wall motion abnormalities. The left ventricular internal cavity size was normal in size. There is  no left ventricular hypertrophy. Left ventricular diastolic parameters are consistent with Grade I diastolic dysfunction (impaired relaxation). Normal left ventricular filling pressure. Right Ventricle: The right ventricular size is normal. No increase in right ventricular wall thickness. Right ventricular systolic function is normal. Tricuspid regurgitation signal is inadequate for assessing PA pressure. Left Atrium: Left atrial size was normal in size. Right Atrium: Right atrial size was normal in size. Pericardium: There is no evidence of pericardial effusion. Mitral Valve: The mitral valve is normal in structure. Mild mitral annular calcification. Trivial mitral valve regurgitation. No evidence of mitral valve stenosis. Tricuspid Valve: The tricuspid valve is normal in structure. Tricuspid valve regurgitation is not demonstrated. No evidence of tricuspid stenosis. Aortic Valve: The aortic valve is normal in structure. There is mild calcification of the aortic valve. There is mild thickening of the aortic valve.  Aortic valve regurgitation is not visualized. Aortic valve sclerosis/calcification is present, without any evidence of aortic stenosis. Aortic valve mean gradient measures 3.0 mmHg. Aortic valve peak gradient measures 6.8 mmHg. Aortic valve area, by VTI measures 1.89 cm. Pulmonic Valve: The pulmonic valve was normal in structure. Pulmonic  valve regurgitation is not visualized. No evidence of pulmonic stenosis. Aorta: The aortic root is normal in size and structure. Venous: The inferior vena cava is normal in size with greater than 50% respiratory variability, suggesting right atrial pressure of 3 mmHg. IAS/Shunts: No atrial level shunt detected by color flow Doppler.  LEFT VENTRICLE PLAX 2D LVIDd:         4.60 cm     Diastology LVIDs:         3.20 cm     LV e' medial:    6.85 cm/s LV PW:         0.80 cm     LV E/e' medial:  7.2 LV IVS:        0.90 cm     LV e' lateral:   6.42 cm/s LVOT diam:     2.00 cm     LV E/e' lateral: 7.7 LV SV:         44 LV SV Index:   23 LVOT Area:     3.14 cm LV IVRT:       109 msec  LV Volumes (MOD) LV vol d, MOD A2C: 61.0 ml LV vol d, MOD A4C: 76.7 ml LV vol s, MOD A2C: 25.7 ml LV vol s, MOD A4C: 30.8 ml LV SV MOD A2C:     35.3 ml LV SV MOD A4C:     76.7 ml LV SV MOD BP:      44.6 ml RIGHT VENTRICLE          IVC RV Basal diam:  3.00 cm  IVC diam: 1.80 cm TAPSE (M-mode): 1.8 cm LEFT ATRIUM             Index LA diam:        3.90 cm 2.05 cm/m LA Vol (A2C):   37.8 ml 19.84 ml/m LA Vol (A4C):   26.2 ml 13.75 ml/m LA Biplane Vol: 32.8 ml 17.21 ml/m  AORTIC VALVE AV Area (Vmax):    1.69 cm AV Area (Vmean):   1.86 cm AV Area (VTI):     1.89 cm AV Vmax:           130.00 cm/s AV Vmean:          85.900 cm/s AV VTI:            0.231 m AV Peak Grad:      6.8 mmHg AV Mean Grad:      3.0 mmHg LVOT Vmax:         70.00 cm/s LVOT Vmean:        50.800 cm/s LVOT VTI:          0.139 m LVOT/AV VTI ratio: 0.60  AORTA Ao Root diam: 3.60 cm Ao Asc diam:  3.60 cm MITRAL VALVE MV Area (PHT): 3.21 cm     SHUNTS MV Decel Time: 236 msec    Systemic VTI:  0.14 m MV E velocity: 49.30 cm/s  Systemic Diam: 2.00 cm MV A velocity: 75.50 cm/s MV E/A ratio:  0.65 Mihai Croitoru MD Electronically signed by Jerel Balding MD Signature Date/Time: 07/11/2024/12:12:10 PM    Final    EEG adult Result Date: 07/11/2024 Shelton Arlin KIDD, MD     07/11/2024  8:21 AM Patient Name: EASTEN MACEACHERN MRN: [id number] Epilepsy Attending: Arlin KIDD Shelton Referring Physician/Provider: Remi Pippin, NP Date: 07/10/2024 Duration: 28.12 mins Patient history: 89yo M with slurred speech, drooling, bilateral lower extremity weakness. EEG to evaluate for seizure Level of alertness: Awake  AEDs during EEG study: None Technical aspects: This EEG study was done with scalp electrodes positioned according to the 10-20 International system of electrode placement. Electrical activity was reviewed with band pass filter of 1-70Hz , sensitivity of 7 uV/mm, display speed of 98mm/sec with a 60Hz  notched filter applied as appropriate. EEG data were recorded continuously and digitally stored.  Video monitoring was available and reviewed as appropriate. Description: The posterior dominant rhythm consists of 9 Hz activity of moderate voltage (25-35 uV) seen predominantly in posterior head regions, symmetric and reactive to eye opening and eye closing. EEG showed intermittent 5 to 6 Hz theta slowing in ;left hemisphere. Hyperventilation and photic stimulation were not performed.   ABNORMALITY - Intermittent slow, left hemisphere IMPRESSION: This study is suggestive of non specific cortical dysfunction arising from left hemisphere. No seizures or epileptiform discharges were seen throughout the recording. Priyanka MALVA Krebs   CT HEAD WO CONTRAST ( ) Result Date: 07/11/2024 EXAM: CT HEAD WITHOUT CONTRAST 07/11/2024 05:16:57 AM TECHNIQUE: CT of the head was performed without the administration of intravenous contrast. Automated exposure control, iterative reconstruction,  and/or weight based adjustment of the mA/kV was utilized to reduce the radiation dose to as low as reasonably achievable. COMPARISON: CT head from 07/10/2024 and earlier. CLINICAL HISTORY: 89 year old male. Stroke, follow up. Hospitalized for code stroke presentation. Severe left middle cerebral artery branch stenosis or short segment occlusion on computed tomography angiography. Other severe intracranial atherosclerosis and stenosis. FINDINGS: BRAIN AND VENTRICLES: No acute hemorrhage. No hydrocephalus. No extra-axial collection. No mass effect or midline shift. Stable brain volume. Asymmetric heterogeneity in the bilateral cerebral white matter, bilateral deep gray nuclei. Stable gray white differentiation. Advanced calcified atherosclerosis at the skull base. ORBITS: No gaze deviation. SINUSES: Tympanic cavities, mastoids and paranasal sinuses remain well aerated. SOFT TISSUES AND SKULL: Right side cochlear implant redemonstrated with streak artifact. No adverse features. No acute soft tissue abnormality. No skull fracture. IMPRESSION: 1. No acute or evolving infarct identified by CT. No acute intracranial abnormality. Electronically signed by: Helayne Hurst MD 07/11/2024 05:27 AM EST RP Workstation: [id number]   CT ANGIO HEAD NECK W WO CM (CODE STROKE) Result Date: 07/10/2024 EXAM: CTA HEAD AND NECK WITH AND WITHOUT 07/10/2024 02:24:23 PM TECHNIQUE: CTA of the head and neck was performed with and without the administration of intravenous contrast. 75 mL of iohexol  (OMNIPAQUE ) 350 MG/ML injection was administered. Multiplanar 2D and/or 3D reformatted images are provided for review. Automated exposure control, iterative reconstruction, and/or weight based adjustment of the mA/kV was utilized to reduce the radiation dose to as low as reasonably achievable. Stenosis of the internal carotid arteries measured using NASCET criteria. COMPARISON: Same day CT head as well as CTA head and neck 10/21/2020. CLINICAL  HISTORY: Neuro deficit, acute, stroke suspected. Acute neurological deficit, stroke suspected. FINDINGS: CTA NECK: AORTIC ARCH AND ARCH VESSELS: Mild atherosclerosis of the aortic arch. Atherosclerosis of the proximal subclavian arteries without significant stenosis. No dissection or arterial injury. CERVICAL CAROTID ARTERIES: Mild atherosclerosis at the right carotid bifurcation without hemodynamically significant stenosis. Mild atherosclerosis at the left carotid bifurcation without hemodynamically significant stenosis. Partial retropharyngeal course of the proximal left cervical ICA. Tortuosity of the left cervical ICA. No dissection or arterial injury. CERVICAL VERTEBRAL ARTERIES: Atherosclerosis at the right vertebral artery origin results in severe stenosis. Mild tortuosity of the right V1 segment. Atherosclerosis at the left vertebral artery origin resulting in mild stenosis. Tortuosity of the left V1 segment. No dissection or arterial injury. LUNGS AND MEDIASTINUM: Unremarkable.  SOFT TISSUES: No acute abnormality. BONES: Sequelae of median sternotomy. Degenerative changes in the visualized spine. CTA HEAD: ANTERIOR CIRCULATION: There is sclerosis in the bilateral carotid siphons. There is mild stenosis of the right supraclinoid ICA. The anterior cerebral arteries are patent bilaterally. Moderate stenosis of the distal A2 segment of the left ACA. The M1 segment of the left MCA is patent. Proximal M2 branches of the left MCA are patent. There is atherosclerotic irregularity of multiple left MCA branches. Focal severe stenosis of an M2 superior division branch of the left MCA (series 7, image 82). Additional severe stenosis of an M2 superior division branch of the left MCA (series 7, image 82). Additional severe stenosis versus short segment occlusion of a distal M2 branch of the left MCA (series 7, image 97 and series 11, image 146). There is severe stenosis versus short segment occlusion of an M4 branch of  the left MCA over the inferior aspect of the parietal lobe (series 7, image 88). The right MCA is patent. Evaluation is somewhat limited due to streak artifact. Within these limitations, there are areas of mild atherosclerotic irregularity of right MCA branches without evidence of severe stenosis or occlusion. No aneurysm. POSTERIOR CIRCULATION: Severe stenosis of the P3 segment of the right PCA. Moderate stenosis of the mid P2 segment of the left PCA. No significant stenosis of the basilar artery. No significant stenosis of the vertebral arteries. No aneurysm. IMPRESSION: 1. Atherosclerotic irregularity of multiple left MCA branches. Focal severe stenosis of an M2 superior division branch. Additional severe stenosis versus short segment occlusion of a distal M2 branch. Severe stenosis versus short segment occlusion of an M4 branch over the inferior left parietal lobe. Findings are new since the 2022 CTA. 2. Severe stenosis of the P3 segment of the right PCA and moderate stenosis of the mid P2 segment of the left PCA, increased from prior. 3. Moderate stenosis of the distal A2 segment of the left ACA, similar to prior. 4. Severe stenosis at the right vertebral artery origin, similar to prior. 5. Impression 1 messaged to Dr. Voncile via the [facility] messaging system at 2:37 PM on 07/10/24. Electronically signed by: Donnice Mania MD 07/10/2024 02:53 PM EST RP Workstation: [id number]   CT HEAD CODE STROKE WO CONTRAST Result Date: 07/10/2024 EXAM: CT HEAD WITHOUT CONTRAST 07/10/2024 02:23:58 PM TECHNIQUE: CT of the head was performed without the administration of intravenous contrast. Automated exposure control, iterative reconstruction, and/or weight based adjustment of the mA/kV was utilized to reduce the radiation dose to as low as reasonably achievable. COMPARISON: CT head 02/03/2023. CLINICAL HISTORY: Acute neurological deficit, stroke suspected. FINDINGS: BRAIN AND VENTRICLES: Right sided cochlear implant noted with  associated streak artifact which slightly limits evaluation particularly along the right temporal and occipital lobes. Mild chronic microvascular ischemic changes. Mild generalized parenchymal volume loss. 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 Atherosclerosis involving the carotid siphons and intracranial vertebral arteries. No acute hemorrhage. No evidence of acute infarct. No hydrocephalus. No extra-axial collection. No mass effect or midline shift. ORBITS: Right lens replacement. SINUSES: No acute abnormality. SOFT TISSUES AND SKULL: Right sided cochlear implant noted. No skull fracture. [place] stroke program early CT (ASPECT) score: Ganglionic (caudate, internal capsule, lentiform nucleus, insula, M1-M3): 7 Supraganglionic (M4-M6): 3 Total: 10 IMPRESSION: 1. No acute intracranial abnormality. 2. ASPECTS is 10. 3. 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 4. Evaluation is mildly limited by streak artifact from a right cochlear implant. 5. Findings messaged to Dr. Arora via the  amion messaging system at 2:37 PM on 07/10/24. Electronically signed by: Donnice Mania MD 07/10/2024 02:38 PM EST RP Workstation: [id number]    Assessment/Plan Stroke (HCC) Hospitalized 07/10/24-07/11/24 for sudden onset of slurred speech, drooling, BLE weakness(resolved) CT head negative for acute intracranial abnormality, not a candidate for TNK.   started Brilinta , ASA for 3months, then per Cardiology.   Benign prostatic hyperplasia with urinary obstruction urinary frequency, on Tamsulossin, Finasteride .   Essential hypertension, benign on Lisinopril .   Type 2 diabetes mellitus with renal complication (HCC) paused Metformin , Hgb A1c 6.7 07/11/24 Bun/creat 21/1.12 07/11/24 Fasting CBG 2x/week, Hgb A1c 3 months.   History of gout Stable taking Allopurinol , Colchicine      Family/ staff Communication: plan of care reviewed with the patient and charge nurse.   Labs/tests ordered:  Hgb A1c 3 months.             [1] Allergies Allergen Reactions   Simvastatin  Other (See Comments)    Possible statin-induced pancreatitis  "

## 2024-07-15 ENCOUNTER — Encounter: Payer: Self-pay | Admitting: Nurse Practitioner

## 2024-07-24 ENCOUNTER — Telehealth: Payer: Self-pay | Admitting: Adult Health

## 2024-07-24 NOTE — Telephone Encounter (Signed)
 Request to cancel appointment , does not feel it is needed

## 2024-08-10 ENCOUNTER — Inpatient Hospital Stay: Admitting: Adult Health
